# Patient Record
Sex: Female | Born: 1935 | Race: White | Hispanic: No | Smoking: Never smoker
Health system: Southern US, Community
[De-identification: ages and names within clinical notes are randomized; demographics above are authoritative.]

## PROBLEM LIST (undated history)

## (undated) DIAGNOSIS — J45909 Unspecified asthma, uncomplicated: Secondary | ICD-10-CM

## (undated) DIAGNOSIS — I639 Cerebral infarction, unspecified: Secondary | ICD-10-CM

## (undated) DIAGNOSIS — N182 Chronic kidney disease, stage 2 (mild): Secondary | ICD-10-CM

## (undated) DIAGNOSIS — R9439 Abnormal result of other cardiovascular function study: Secondary | ICD-10-CM

## (undated) DIAGNOSIS — Z8679 Personal history of other diseases of the circulatory system: Secondary | ICD-10-CM

## (undated) DIAGNOSIS — N3941 Urge incontinence: Secondary | ICD-10-CM

## (undated) DIAGNOSIS — R0602 Shortness of breath: Secondary | ICD-10-CM

## (undated) DIAGNOSIS — F419 Anxiety disorder, unspecified: Secondary | ICD-10-CM

## (undated) DIAGNOSIS — Z87898 Personal history of other specified conditions: Secondary | ICD-10-CM

## (undated) DIAGNOSIS — K219 Gastro-esophageal reflux disease without esophagitis: Secondary | ICD-10-CM

## (undated) DIAGNOSIS — I2510 Atherosclerotic heart disease of native coronary artery without angina pectoris: Principal | ICD-10-CM

## (undated) DIAGNOSIS — M48061 Spinal stenosis, lumbar region without neurogenic claudication: Secondary | ICD-10-CM

## (undated) DIAGNOSIS — H409 Unspecified glaucoma: Secondary | ICD-10-CM

## (undated) DIAGNOSIS — E119 Type 2 diabetes mellitus without complications: Secondary | ICD-10-CM

## (undated) DIAGNOSIS — G4733 Obstructive sleep apnea (adult) (pediatric): Secondary | ICD-10-CM

## (undated) DIAGNOSIS — K5792 Diverticulitis of intestine, part unspecified, without perforation or abscess without bleeding: Secondary | ICD-10-CM

## (undated) DIAGNOSIS — I480 Paroxysmal atrial fibrillation: Secondary | ICD-10-CM

## (undated) DIAGNOSIS — Z9861 Coronary angioplasty status: Principal | ICD-10-CM

## (undated) DIAGNOSIS — J302 Other seasonal allergic rhinitis: Secondary | ICD-10-CM

## (undated) DIAGNOSIS — M1990 Unspecified osteoarthritis, unspecified site: Secondary | ICD-10-CM

## (undated) DIAGNOSIS — I10 Essential (primary) hypertension: Secondary | ICD-10-CM

## (undated) DIAGNOSIS — G43909 Migraine, unspecified, not intractable, without status migrainosus: Secondary | ICD-10-CM

## (undated) DIAGNOSIS — H269 Unspecified cataract: Secondary | ICD-10-CM

## (undated) DIAGNOSIS — I495 Sick sinus syndrome: Secondary | ICD-10-CM

## (undated) DIAGNOSIS — E785 Hyperlipidemia, unspecified: Secondary | ICD-10-CM

## (undated) HISTORY — PX: COLONOSCOPY: SHX174

## (undated) HISTORY — DX: Gastro-esophageal reflux disease without esophagitis: K21.9

## (undated) HISTORY — DX: Obstructive sleep apnea (adult) (pediatric): G47.33

## (undated) HISTORY — PX: VAGINAL HYSTERECTOMY: SUR661

## (undated) HISTORY — DX: Cerebral infarction, unspecified: I63.9

## (undated) HISTORY — DX: Diverticulitis of intestine, part unspecified, without perforation or abscess without bleeding: K57.92

## (undated) HISTORY — DX: Urge incontinence: N39.41

## (undated) HISTORY — DX: Hyperlipidemia, unspecified: E78.5

## (undated) HISTORY — DX: Unspecified osteoarthritis, unspecified site: M19.90

## (undated) HISTORY — DX: Unspecified cataract: H26.9

## (undated) HISTORY — DX: Unspecified asthma, uncomplicated: J45.909

## (undated) HISTORY — DX: Anxiety disorder, unspecified: F41.9

## (undated) HISTORY — DX: Coronary angioplasty status: Z98.61

## (undated) HISTORY — DX: Other seasonal allergic rhinitis: J30.2

## (undated) HISTORY — DX: Migraine, unspecified, not intractable, without status migrainosus: G43.909

## (undated) HISTORY — PX: OTHER SURGICAL HISTORY: SHX169

## (undated) HISTORY — DX: Chronic kidney disease, stage 2 (mild): N18.2

## (undated) HISTORY — DX: Paroxysmal atrial fibrillation: I48.0

## (undated) HISTORY — DX: Essential (primary) hypertension: I10

## (undated) HISTORY — DX: Sick sinus syndrome: I49.5

## (undated) HISTORY — DX: Shortness of breath: R06.02

## (undated) HISTORY — PX: BREAST BIOPSY: SHX20

## (undated) HISTORY — DX: Personal history of other specified conditions: Z87.898

## (undated) HISTORY — DX: Personal history of other diseases of the circulatory system: Z86.79

## (undated) HISTORY — PX: APPENDECTOMY: SHX54

## (undated) HISTORY — DX: Atherosclerotic heart disease of native coronary artery without angina pectoris: I25.10

## (undated) HISTORY — DX: Unspecified glaucoma: H40.9

## (undated) HISTORY — DX: Spinal stenosis, lumbar region without neurogenic claudication: M48.061

## (undated) HISTORY — DX: Type 2 diabetes mellitus without complications: E11.9

---

## 1998-02-04 ENCOUNTER — Ambulatory Visit (HOSPITAL_BASED_OUTPATIENT_CLINIC_OR_DEPARTMENT_OTHER): Admission: RE | Admit: 1998-02-04 | Discharge: 1998-02-04 | Payer: Self-pay | Admitting: General Surgery

## 1999-04-21 ENCOUNTER — Encounter: Admission: RE | Admit: 1999-04-21 | Discharge: 1999-04-21 | Payer: Self-pay | Admitting: *Deleted

## 1999-08-05 ENCOUNTER — Other Ambulatory Visit: Admission: RE | Admit: 1999-08-05 | Discharge: 1999-08-05 | Payer: Self-pay | Admitting: *Deleted

## 2000-04-24 ENCOUNTER — Encounter: Admission: RE | Admit: 2000-04-24 | Discharge: 2000-04-24 | Payer: Self-pay | Admitting: *Deleted

## 2001-04-25 ENCOUNTER — Encounter: Admission: RE | Admit: 2001-04-25 | Discharge: 2001-04-25 | Payer: Self-pay | Admitting: *Deleted

## 2002-05-27 ENCOUNTER — Encounter: Admission: RE | Admit: 2002-05-27 | Discharge: 2002-05-27 | Payer: Self-pay | Admitting: *Deleted

## 2003-06-11 ENCOUNTER — Encounter: Admission: RE | Admit: 2003-06-11 | Discharge: 2003-06-11 | Payer: Self-pay | Admitting: *Deleted

## 2004-06-13 ENCOUNTER — Encounter: Admission: RE | Admit: 2004-06-13 | Discharge: 2004-06-13 | Payer: Self-pay | Admitting: *Deleted

## 2005-07-11 ENCOUNTER — Encounter: Admission: RE | Admit: 2005-07-11 | Discharge: 2005-07-11 | Payer: Self-pay | Admitting: *Deleted

## 2006-07-16 ENCOUNTER — Encounter: Admission: RE | Admit: 2006-07-16 | Discharge: 2006-07-16 | Payer: Self-pay | Admitting: Obstetrics and Gynecology

## 2006-07-18 ENCOUNTER — Encounter: Admission: RE | Admit: 2006-07-18 | Discharge: 2006-07-18 | Payer: Self-pay | Admitting: Obstetrics and Gynecology

## 2006-12-03 ENCOUNTER — Ambulatory Visit: Payer: Self-pay

## 2007-01-30 ENCOUNTER — Observation Stay (HOSPITAL_COMMUNITY): Admission: EM | Admit: 2007-01-30 | Discharge: 2007-01-30 | Payer: Self-pay | Admitting: Emergency Medicine

## 2007-01-30 ENCOUNTER — Encounter (INDEPENDENT_AMBULATORY_CARE_PROVIDER_SITE_OTHER): Payer: Self-pay | Admitting: Surgery

## 2007-07-23 ENCOUNTER — Encounter: Admission: RE | Admit: 2007-07-23 | Discharge: 2007-07-23 | Payer: Self-pay | Admitting: Obstetrics and Gynecology

## 2008-08-07 ENCOUNTER — Encounter: Admission: RE | Admit: 2008-08-07 | Discharge: 2008-08-07 | Payer: Self-pay | Admitting: Obstetrics and Gynecology

## 2009-08-09 ENCOUNTER — Encounter: Admission: RE | Admit: 2009-08-09 | Discharge: 2009-08-09 | Payer: Self-pay | Admitting: Obstetrics and Gynecology

## 2009-09-01 ENCOUNTER — Encounter (INDEPENDENT_AMBULATORY_CARE_PROVIDER_SITE_OTHER): Payer: Self-pay | Admitting: *Deleted

## 2009-11-17 ENCOUNTER — Ambulatory Visit: Payer: Self-pay | Admitting: Ophthalmology

## 2009-11-26 ENCOUNTER — Ambulatory Visit: Payer: Self-pay | Admitting: Cardiovascular Disease

## 2009-11-29 ENCOUNTER — Ambulatory Visit: Payer: Self-pay | Admitting: Ophthalmology

## 2009-12-13 ENCOUNTER — Ambulatory Visit: Payer: Self-pay | Admitting: Ophthalmology

## 2009-12-20 ENCOUNTER — Ambulatory Visit: Payer: Self-pay | Admitting: Ophthalmology

## 2010-03-20 ENCOUNTER — Encounter: Payer: Self-pay | Admitting: Obstetrics and Gynecology

## 2010-03-29 NOTE — Letter (Signed)
Summary: Colonoscopy Letter  [facility]  [address] [place], [place] [address]   Phone: [phone]  Fax: [phone]      September 01, 2009 MRN: [id number]   [facility] [address] [place], [place]  [address]   Dear [name],   According to your medical record, it is time for you to schedule a Colonoscopy. The American Cancer Society recommends this procedure as a method to detect early colon cancer. Patients with a family history of colon cancer, or a personal history of colon polyps or inflammatory bowel disease are at increased risk.  This letter has beeen generated based on the recommendations made at the time of your procedure. If you feel that in your particular situation this may no longer apply, please contact our office.  Please call our office at [phone] to schedule this appointment or to update your records at your earliest convenience.  Thank you for cooperating with [place] to provide you with the very best care possible.   Sincerely,  Hedwig Morton. Juanda Chance, M.D.  [facility]astro[facility]ology [facility] [phone]

## 2010-07-12 NOTE — Op Note (Signed)
NAMESWAYZE, KOZUCH                  ACCOUNT NO.:  [id number]   MEDICAL RECORD NO.:  [id number]          PATIENT TYPE:  INP   LOCATION:  2550                         FACILITY:  [facility]   PHYSICIAN:  Wilmon Arms. Corliss Skains, M.D. DATE OF BIRTH:  05-28-1929   DATE OF PROCEDURE:  01/30/2007  DATE OF DISCHARGE:                               OPERATIVE REPORT   PREOPERATIVE DIAGNOSIS:  Acute appendicitis.   POSTOPERATIVE DIAGNOSIS:  Acute appendicitis.   PROCEDURE PERFORMED:  Laparoscopic appendectomy.   SURGEON:  Wilmon Arms. Corliss Skains, M.D., FACS   ANESTHESIA:  General.   INDICATIONS:  The patient is a 77 year old female who presents with a 24-  hour history of right lower quadrant pain and nausea.  She presented for  workup and a CT scan showed signs of appendicitis.  We were then  consulted and recommended urgent surgery.   DESCRIPTION OF PROCEDURE:  The patient brought to the operating room,  placed in supine position on operating table.  After adequate level of  general anesthesia was obtained, a Foley catheter was placed under  sterile technique.  The patient's abdomen was prepped with Betadine and  draped in sterile fashion.  Time-out was taken assure the proper  patient, proper procedure.  A small incision was made just below her  umbilicus after infiltrating 0.25% Marcaine.  Dissection was carried  down to the fascia which was opened vertically.  The peritoneal cavity  was bluntly entered.  Stay sutures of 0 Vicryl was placed around the  fascial opening.  The Hasson cannula was inserted secured with the stay  suture.  Pneumoperitoneum was obtained by insufflating CO2 maintaining  maximal pressure of 15 mmHg.  A 5-mm port was placed in right upper  quadrant.  Another 5 mm port was placed in the left lower quadrant.  The  scope was moved to the right upper quadrant port site.  The cecum was  mobilized medially.  A small appendix was identified.  The appendix was  not perforated.  It seemed  inflamed at its base.  The mesoappendix was  then divided with the harmonic scalpel.  We divided across the base of  the appendix and the corner of the cecum with Endo-GIA stapler.  We then  placed the appendix in EndoCatch sac and removed it through the  umbilical port site.  We inspected the staple line.  There was a little  bit of oozing in this area.  These were controlled with clips.  We  irrigated the right lower quadrant thoroughly.  Pneumoperitoneum was  then released as ports were removed under direct vision.  The stay  suture was used to close the umbilical fascia.  4-0 Monocryl was used to  close the skin incisions.  Steri-Strips and clean dressings were  applied.  The patient was extubated and brought to recovery in stable  condition.  The Foley catheter was removed.      Wilmon Arms. Tsuei, M.D.  Electronically Signed    MKT/MEDQ  D:  01/30/2007  T:  01/30/2007  Job:  [id number]

## 2010-07-15 ENCOUNTER — Other Ambulatory Visit: Payer: Self-pay | Admitting: *Deleted

## 2010-07-15 DIAGNOSIS — Z1231 Encounter for screening mammogram for malignant neoplasm of breast: Secondary | ICD-10-CM

## 2010-07-15 DIAGNOSIS — M8580 Other specified disorders of bone density and structure, unspecified site: Secondary | ICD-10-CM

## 2010-07-15 NOTE — Discharge Summary (Signed)
[name], [name]                  ACCOUNT NO.:  [id number]   MEDICAL RECORD NO.:  [id number]          PATIENT TYPE:  INP   LOCATION:  5707                         FACILITY:  [facility]   PHYSICIAN:  Abigail Boyd, M.D.   DATE OF BIRTH:  Mar 30, 1929   DATE OF ADMISSION:  01/30/2007  DATE OF DISCHARGE:  01/30/2007                               DISCHARGE SUMMARY   DISCHARGING PHYSICIAN:  Dr. Lurene Shadow.   CHIEF COMPLAINT/REASON FOR ADMISSION:  Abigail Boyd is a 77 year old female  patient with a 24-hour history of right lower quadrant pain, nausea and  anorexia.  She has a past medical history of diabetes, depression,  hypertension and osteoporosis.  She has had a prior vaginal  hysterectomy.  On clinical exam in the ER, she had right lower quadrant  tenderness that was consistent with probable appendicitis.  A CT scan  did reveal acute appendicitis.  Her white count was 15,600.  She was  subsequently admitted by Dr. Corliss Skains with a diagnosis of acute  appendicitis.   HOSPITAL COURSE:  The patient was taken from the ER to the OR where she  underwent a laparoscopic appendectomy for acute appendicitis.  She  tolerated the procedure well and was sent back to the general floor to  recover.   On postoperative day one-half, January 30, 2007 at 9:30 a.m., she was  evaluated per myself. She was still requiring nasal cannula O2 but this  was being weaned off.  She was afebrile, vital signs were stable.  Her  creatinine was 1.17, potassium 4.2.  She was tolerating a solid diet and  had no complaints.  Her abdomen was soft, slightly distended but she had  active bowel sounds and she was tender over her surgical incisions.  Plans were to advance her diet and switch her to oral pain medications  and give her Toradol while here in the hospital. The patient reports  successful use of ibuprofen at home and plans are to discharge her after  2 o'clock if she tolerated these changes, she did and she was  subsequently discharged home on January 30, 2007.   DISCHARGE DIAGNOSES:  1. Acute appendicitis.  2. Status post laparoscopic appendectomy on January 30, 2007 by Dr.      Corliss Skains.   DISCHARGE MEDICATIONS:  The patient will resume the following home  medications:  1. Glipizide 10 mg daily 8 hour sleep.  2. Actonel 150 mg monthly.  3. Citrucel fiber laxative twice daily.  4. Claritin daily.  5. Hydrochlorothiazide 50 mg daily.  6. Paroxetine 10 mg daily.  7. Crestor 10 mg daily.  8. Omeprazole 20 mg daily.  9. Aspirin 81 mg daily.  10.Flonase nasal spray p.r.n.   NEW MEDICATIONS:  1. Vicodin 5/500 one tablet every 4 hours as needed for pain.  2. Over the counter ibuprofen 2-3 tablets every 8 hours as needed for      pain in addition to Vicodin. Take with food or snack.  3. If you take Tylenol instead of Vicodin do not take additional time      with the  Vicodin since Vicodin contains Tylenol. If you take a dose      of Vicodin, you can take plain Tylenol 4 hours after last dose of      Vicodin.   OTHER INSTRUCTIONS:  Please refer to [facility]'s  laparoscopic procedures discharge instructions. Followup appointment,  she needs to call Dr. Corliss Skains to be seen in 2 weeks.      Abigail Boyd, N.P.      Abigail Boyd, M.D.  Electronically Signed    ALE/MEDQ  D:  02/25/2007  T:  02/25/2007  Job:  [id number]   cc:   Wilmon Arms. Tsuei, M.D.

## 2010-08-29 ENCOUNTER — Ambulatory Visit
Admission: RE | Admit: 2010-08-29 | Discharge: 2010-08-29 | Disposition: A | Discharge status: Home / Self Care | Payer: Medicare Other | Source: Ambulatory Visit | Attending: *Deleted | Admitting: *Deleted

## 2010-08-29 ENCOUNTER — Other Ambulatory Visit: Payer: Self-pay | Admitting: Obstetrics and Gynecology

## 2010-08-29 DIAGNOSIS — Z1231 Encounter for screening mammogram for malignant neoplasm of breast: Secondary | ICD-10-CM

## 2010-08-29 DIAGNOSIS — M8580 Other specified disorders of bone density and structure, unspecified site: Secondary | ICD-10-CM

## 2010-08-30 ENCOUNTER — Ambulatory Visit
Admission: RE | Admit: 2010-08-30 | Discharge: 2010-08-30 | Disposition: A | Discharge status: Home / Self Care | Payer: Medicare Other | Source: Ambulatory Visit | Attending: *Deleted | Admitting: *Deleted

## 2010-11-22 ENCOUNTER — Encounter: Payer: Self-pay | Admitting: Internal Medicine

## 2010-12-05 LAB — COMPREHENSIVE METABOLIC PANEL
Calcium: 9.5
Chloride: 97
Creatinine, Ser: 1.17
GFR calc non Af Amer: 45 — ABNORMAL LOW
Potassium: 6.8
Sodium: 135
Total Bilirubin: 1.6 — ABNORMAL HIGH

## 2010-12-05 LAB — DIFFERENTIAL
Basophils Relative: 0
Eosinophils Absolute: 0 — ABNORMAL LOW
Lymphocytes Relative: 8 — ABNORMAL LOW
Lymphs Abs: 1.3
Neutro Abs: 13.7 — ABNORMAL HIGH

## 2010-12-05 LAB — LIPASE, BLOOD: Lipase: 24

## 2010-12-05 LAB — POCT CARDIAC MARKERS
CKMB, poc: 3.4
Operator id: 282201

## 2010-12-05 LAB — CBC
RDW: 13.5
WBC: 15.6 — ABNORMAL HIGH

## 2010-12-05 LAB — URINALYSIS, ROUTINE W REFLEX MICROSCOPIC: Hgb urine dipstick: NEGATIVE

## 2010-12-13 ENCOUNTER — Encounter: Payer: Self-pay | Admitting: Internal Medicine

## 2010-12-13 ENCOUNTER — Ambulatory Visit (AMBULATORY_SURGERY_CENTER): Payer: Medicare Other | Admitting: *Deleted

## 2010-12-13 VITALS — Ht 64.0 in | Wt 161.6 lb

## 2010-12-13 DIAGNOSIS — Z1211 Encounter for screening for malignant neoplasm of colon: Secondary | ICD-10-CM

## 2010-12-13 MED ORDER — PEG-KCL-NACL-NASULF-NA ASC-C 100 G PO SOLR: 1.0000 | Freq: Once | ORAL | Status: DC

## 2010-12-25 ENCOUNTER — Emergency Department: Payer: Self-pay | Admitting: Emergency Medicine

## 2010-12-26 ENCOUNTER — Telehealth: Payer: Self-pay | Admitting: Internal Medicine

## 2010-12-26 ENCOUNTER — Emergency Department: Payer: Self-pay | Admitting: Orthopedic Surgery

## 2010-12-26 NOTE — Telephone Encounter (Signed)
No charge. 

## 2010-12-27 ENCOUNTER — Other Ambulatory Visit: Payer: Medicare Other | Admitting: Internal Medicine

## 2010-12-29 ENCOUNTER — Ambulatory Visit: Payer: Self-pay | Admitting: Orthopedic Surgery

## 2010-12-29 DIAGNOSIS — I10 Essential (primary) hypertension: Secondary | ICD-10-CM

## 2011-01-02 ENCOUNTER — Ambulatory Visit: Payer: Self-pay | Admitting: Orthopedic Surgery

## 2011-02-24 ENCOUNTER — Encounter: Payer: Self-pay | Admitting: Orthopedic Surgery

## 2011-02-28 ENCOUNTER — Encounter: Payer: Self-pay | Admitting: Orthopedic Surgery

## 2011-03-21 DIAGNOSIS — K5792 Diverticulitis of intestine, part unspecified, without perforation or abscess without bleeding: Secondary | ICD-10-CM | POA: Insufficient documentation

## 2011-03-21 DIAGNOSIS — K649 Unspecified hemorrhoids: Secondary | ICD-10-CM | POA: Insufficient documentation

## 2011-03-21 DIAGNOSIS — E559 Vitamin D deficiency, unspecified: Secondary | ICD-10-CM | POA: Insufficient documentation

## 2011-03-21 DIAGNOSIS — H409 Unspecified glaucoma: Secondary | ICD-10-CM | POA: Insufficient documentation

## 2011-03-21 DIAGNOSIS — E049 Nontoxic goiter, unspecified: Secondary | ICD-10-CM | POA: Insufficient documentation

## 2011-03-21 DIAGNOSIS — K219 Gastro-esophageal reflux disease without esophagitis: Secondary | ICD-10-CM | POA: Insufficient documentation

## 2011-03-21 DIAGNOSIS — M8580 Other specified disorders of bone density and structure, unspecified site: Secondary | ICD-10-CM | POA: Insufficient documentation

## 2011-03-21 DIAGNOSIS — Z8669 Personal history of other diseases of the nervous system and sense organs: Secondary | ICD-10-CM | POA: Insufficient documentation

## 2011-03-21 DIAGNOSIS — Z862 Personal history of diseases of the blood and blood-forming organs and certain disorders involving the immune mechanism: Secondary | ICD-10-CM | POA: Insufficient documentation

## 2011-03-21 DIAGNOSIS — N8501 Benign endometrial hyperplasia: Secondary | ICD-10-CM | POA: Insufficient documentation

## 2011-03-31 ENCOUNTER — Encounter: Payer: Self-pay | Admitting: Orthopedic Surgery

## 2011-04-28 ENCOUNTER — Encounter: Payer: Self-pay | Admitting: Orthopedic Surgery

## 2011-06-13 DIAGNOSIS — I200 Unstable angina: Secondary | ICD-10-CM | POA: Diagnosis not present

## 2011-06-13 DIAGNOSIS — Z8679 Personal history of other diseases of the circulatory system: Secondary | ICD-10-CM

## 2011-06-13 HISTORY — DX: Personal history of other diseases of the circulatory system: Z86.79

## 2011-06-21 DIAGNOSIS — R9439 Abnormal result of other cardiovascular function study: Secondary | ICD-10-CM

## 2011-06-21 HISTORY — DX: Abnormal result of other cardiovascular function study: R94.39

## 2011-06-26 ENCOUNTER — Encounter (HOSPITAL_COMMUNITY): Payer: Self-pay | Admitting: Pharmacy Technician

## 2011-06-28 ENCOUNTER — Other Ambulatory Visit: Payer: Self-pay | Admitting: Cardiology

## 2011-06-28 DIAGNOSIS — I2510 Atherosclerotic heart disease of native coronary artery without angina pectoris: Secondary | ICD-10-CM

## 2011-06-28 HISTORY — DX: Atherosclerotic heart disease of native coronary artery without angina pectoris: I25.10

## 2011-06-29 ENCOUNTER — Other Ambulatory Visit: Payer: Self-pay | Admitting: Cardiology

## 2011-06-29 ENCOUNTER — Ambulatory Visit
Admission: RE | Admit: 2011-06-29 | Discharge: 2011-06-29 | Disposition: A | Discharge status: Home / Self Care | Payer: Medicare Other | Source: Ambulatory Visit | Attending: Cardiology | Admitting: Cardiology

## 2011-06-29 DIAGNOSIS — R52 Pain, unspecified: Secondary | ICD-10-CM

## 2011-07-05 HISTORY — PX: CORONARY ANGIOPLASTY WITH STENT PLACEMENT: SHX49

## 2011-07-06 ENCOUNTER — Encounter (HOSPITAL_COMMUNITY)
Admission: RE | Disposition: A | Discharge status: Home / Self Care | Payer: Self-pay | Source: Ambulatory Visit | Attending: Cardiology

## 2011-07-06 ENCOUNTER — Encounter (HOSPITAL_COMMUNITY): Payer: Self-pay | Admitting: Cardiology

## 2011-07-06 ENCOUNTER — Inpatient Hospital Stay (HOSPITAL_COMMUNITY)
Admission: RE | Admit: 2011-07-06 | Discharge: 2011-07-07 | DRG: 247 | Disposition: A | Discharge status: Home / Self Care | Payer: Medicare Other | Source: Ambulatory Visit | Attending: Cardiology | Admitting: Cardiology

## 2011-07-06 DIAGNOSIS — E7800 Pure hypercholesterolemia, unspecified: Secondary | ICD-10-CM | POA: Diagnosis present

## 2011-07-06 DIAGNOSIS — Z7982 Long term (current) use of aspirin: Secondary | ICD-10-CM

## 2011-07-06 DIAGNOSIS — I2510 Atherosclerotic heart disease of native coronary artery without angina pectoris: Principal | ICD-10-CM | POA: Diagnosis present

## 2011-07-06 DIAGNOSIS — I10 Essential (primary) hypertension: Secondary | ICD-10-CM | POA: Diagnosis present

## 2011-07-06 DIAGNOSIS — K219 Gastro-esophageal reflux disease without esophagitis: Secondary | ICD-10-CM | POA: Diagnosis present

## 2011-07-06 DIAGNOSIS — E1169 Type 2 diabetes mellitus with other specified complication: Secondary | ICD-10-CM | POA: Diagnosis present

## 2011-07-06 DIAGNOSIS — Z79899 Other long term (current) drug therapy: Secondary | ICD-10-CM

## 2011-07-06 DIAGNOSIS — Z9849 Cataract extraction status, unspecified eye: Secondary | ICD-10-CM

## 2011-07-06 DIAGNOSIS — Z955 Presence of coronary angioplasty implant and graft: Secondary | ICD-10-CM

## 2011-07-06 DIAGNOSIS — M129 Arthropathy, unspecified: Secondary | ICD-10-CM | POA: Diagnosis present

## 2011-07-06 DIAGNOSIS — N189 Chronic kidney disease, unspecified: Secondary | ICD-10-CM | POA: Diagnosis present

## 2011-07-06 DIAGNOSIS — Z882 Allergy status to sulfonamides status: Secondary | ICD-10-CM

## 2011-07-06 DIAGNOSIS — E785 Hyperlipidemia, unspecified: Secondary | ICD-10-CM | POA: Diagnosis present

## 2011-07-06 DIAGNOSIS — I200 Unstable angina: Secondary | ICD-10-CM | POA: Diagnosis present

## 2011-07-06 DIAGNOSIS — H409 Unspecified glaucoma: Secondary | ICD-10-CM | POA: Diagnosis present

## 2011-07-06 DIAGNOSIS — E119 Type 2 diabetes mellitus without complications: Secondary | ICD-10-CM | POA: Diagnosis present

## 2011-07-06 DIAGNOSIS — I129 Hypertensive chronic kidney disease with stage 1 through stage 4 chronic kidney disease, or unspecified chronic kidney disease: Secondary | ICD-10-CM | POA: Diagnosis present

## 2011-07-06 DIAGNOSIS — M4800 Spinal stenosis, site unspecified: Secondary | ICD-10-CM | POA: Diagnosis present

## 2011-07-06 DIAGNOSIS — I2582 Chronic total occlusion of coronary artery: Secondary | ICD-10-CM | POA: Diagnosis present

## 2011-07-06 HISTORY — DX: Abnormal result of other cardiovascular function study: R94.39

## 2011-07-06 HISTORY — PX: LEFT HEART CATHETERIZATION WITH CORONARY ANGIOGRAM: SHX5451

## 2011-07-06 LAB — GLUCOSE, CAPILLARY
Glucose-Capillary: 173 mg/dL — ABNORMAL HIGH (ref 70–99)
Glucose-Capillary: 216 mg/dL — ABNORMAL HIGH (ref 70–99)

## 2011-07-06 LAB — POCT ACTIVATED CLOTTING TIME
Activated Clotting Time: 188 seconds
Activated Clotting Time: 226 seconds

## 2011-07-06 SURGERY — LEFT HEART CATHETERIZATION WITH CORONARY ANGIOGRAM
Anesthesia: LOCAL

## 2011-07-06 MED ORDER — MAGNESIUM OXIDE 400 (241.3 MG) MG PO TABS
400.0000 mg | ORAL_TABLET | Freq: Every day | ORAL | Status: DC
  Administered 2011-07-06: 400 mg via ORAL
  Filled 2011-07-06 (×2): qty 1

## 2011-07-06 MED ORDER — GLIPIZIDE 10 MG PO TABS
10.0000 mg | ORAL_TABLET | Freq: Every day | ORAL | Status: DC
  Administered 2011-07-07: 10 mg via ORAL
  Filled 2011-07-06 (×2): qty 1

## 2011-07-06 MED ORDER — MIDAZOLAM HCL 2 MG/2ML IJ SOLN
INTRAMUSCULAR | Status: AC
  Filled 2011-07-06: qty 2

## 2011-07-06 MED ORDER — IPRATROPIUM BROMIDE 0.06 % NA SOLN
1.0000 | Freq: Every day | NASAL | Status: DC
  Filled 2011-07-06: qty 15

## 2011-07-06 MED ORDER — LIDOCAINE HCL (PF) 1 % IJ SOLN
INTRAMUSCULAR | Status: AC
  Filled 2011-07-06: qty 30

## 2011-07-06 MED ORDER — SODIUM CHLORIDE 0.9 % IV SOLN: 1.0000 mL/kg/h | INTRAVENOUS | Status: AC

## 2011-07-06 MED ORDER — VITAMIN D3 50 MCG (2000 UT) PO TABS: 1.0000 | ORAL_TABLET | Freq: Every day | ORAL | Status: DC

## 2011-07-06 MED ORDER — MAGNESIUM OXIDE 400 MG PO TABS: 400.0000 mg | ORAL_TABLET | Freq: Every day | ORAL | Status: DC

## 2011-07-06 MED ORDER — ASPIRIN 81 MG PO CHEW
81.0000 mg | CHEWABLE_TABLET | Freq: Every day | ORAL | Status: DC
  Administered 2011-07-06: 81 mg via ORAL
  Filled 2011-07-06 (×2): qty 1

## 2011-07-06 MED ORDER — HYDROCHLOROTHIAZIDE 50 MG PO TABS
50.0000 mg | ORAL_TABLET | Freq: Every day | ORAL | Status: DC
  Administered 2011-07-06: 50 mg via ORAL
  Filled 2011-07-06 (×2): qty 1

## 2011-07-06 MED ORDER — ONDANSETRON HCL 4 MG/2ML IJ SOLN: 4.0000 mg | Freq: Four times a day (QID) | INTRAMUSCULAR | Status: DC | PRN

## 2011-07-06 MED ORDER — SODIUM CHLORIDE 0.9 % IJ SOLN: 3.0000 mL | INTRAMUSCULAR | Status: DC | PRN

## 2011-07-06 MED ORDER — SODIUM CHLORIDE 0.9 % IV SOLN
INTRAVENOUS | Status: DC
  Administered 2011-07-06: 09:00:00 via INTRAVENOUS

## 2011-07-06 MED ORDER — ATORVASTATIN CALCIUM 40 MG PO TABS
40.0000 mg | ORAL_TABLET | Freq: Every day | ORAL | Status: DC
  Administered 2011-07-06: 40 mg via ORAL
  Filled 2011-07-06 (×2): qty 1

## 2011-07-06 MED ORDER — SODIUM CHLORIDE 0.9 % IJ SOLN: 3.0000 mL | Freq: Two times a day (BID) | INTRAMUSCULAR | Status: DC

## 2011-07-06 MED ORDER — ACETAMINOPHEN 325 MG PO TABS: 650.0000 mg | ORAL_TABLET | ORAL | Status: DC | PRN

## 2011-07-06 MED ORDER — SODIUM CHLORIDE 0.9 % IV SOLN: 250.0000 mL | INTRAVENOUS | Status: DC

## 2011-07-06 MED ORDER — TICAGRELOR 90 MG PO TABS
ORAL_TABLET | ORAL | Status: AC
  Filled 2011-07-06: qty 2

## 2011-07-06 MED ORDER — FENTANYL CITRATE 0.05 MG/ML IJ SOLN
INTRAMUSCULAR | Status: AC
  Filled 2011-07-06: qty 2

## 2011-07-06 MED ORDER — PANTOPRAZOLE SODIUM 40 MG PO TBEC
40.0000 mg | DELAYED_RELEASE_TABLET | Freq: Every day | ORAL | Status: DC
  Administered 2011-07-06: 40 mg via ORAL
  Filled 2011-07-06 (×2): qty 1

## 2011-07-06 MED ORDER — ADENOSINE 12 MG/4ML IV SOLN
12.0000 mL | Freq: Once | INTRAVENOUS | Status: DC
  Filled 2011-07-06: qty 12

## 2011-07-06 MED ORDER — ALPRAZOLAM 0.25 MG PO TABS
0.2500 mg | ORAL_TABLET | Freq: Every day | ORAL | Status: DC | PRN
  Administered 2011-07-06: 0.25 mg via ORAL
  Filled 2011-07-06: qty 1

## 2011-07-06 MED ORDER — BIVALIRUDIN 250 MG IV SOLR
INTRAVENOUS | Status: AC
  Filled 2011-07-06: qty 250

## 2011-07-06 MED ORDER — TICAGRELOR 90 MG PO TABS
90.0000 mg | ORAL_TABLET | Freq: Two times a day (BID) | ORAL | Status: DC
  Administered 2011-07-07: 90 mg via ORAL
  Filled 2011-07-06 (×2): qty 1

## 2011-07-06 MED ORDER — PAROXETINE HCL 10 MG PO TABS
10.0000 mg | ORAL_TABLET | Freq: Every day | ORAL | Status: DC
  Administered 2011-07-06: 10 mg via ORAL
  Filled 2011-07-06 (×2): qty 1

## 2011-07-06 MED ORDER — DIAZEPAM 5 MG PO TABS
5.0000 mg | ORAL_TABLET | ORAL | Status: AC
  Administered 2011-07-06: 5 mg via ORAL
  Filled 2011-07-06: qty 1

## 2011-07-06 MED ORDER — GLIPIZIDE 5 MG PO TABS
5.0000 mg | ORAL_TABLET | Freq: Every evening | ORAL | Status: DC
  Administered 2011-07-06: 5 mg via ORAL
  Filled 2011-07-06 (×2): qty 1

## 2011-07-06 MED ORDER — NITROGLYCERIN 0.2 MG/ML ON CALL CATH LAB
INTRAVENOUS | Status: AC
  Filled 2011-07-06: qty 1

## 2011-07-06 MED ORDER — HEPARIN (PORCINE) IN NACL 2-0.9 UNIT/ML-% IJ SOLN
INTRAMUSCULAR | Status: AC
  Filled 2011-07-06: qty 2000

## 2011-07-06 MED ORDER — LORATADINE 10 MG PO TABS
10.0000 mg | ORAL_TABLET | Freq: Every day | ORAL | Status: DC
  Administered 2011-07-06: 10 mg via ORAL
  Filled 2011-07-06 (×2): qty 1

## 2011-07-06 MED ORDER — AZELASTINE HCL 0.1 % NA SOLN
1.0000 | Freq: Every day | NASAL | Status: DC
  Administered 2011-07-06: 1 via NASAL
  Filled 2011-07-06: qty 30

## 2011-07-06 MED ORDER — HEPARIN SODIUM (PORCINE) 1000 UNIT/ML IJ SOLN
INTRAMUSCULAR | Status: AC
  Filled 2011-07-06: qty 1

## 2011-07-06 MED ORDER — VITAMIN D3 25 MCG (1000 UNIT) PO TABS
1000.0000 [IU] | ORAL_TABLET | Freq: Every day | ORAL | Status: DC
  Administered 2011-07-06: 1000 [IU] via ORAL
  Filled 2011-07-06 (×2): qty 1

## 2011-07-06 NOTE — CV Procedure (Signed)
[facility]     CARDIAC CATHETERIZATION REPORT  NAME: Abigail Boyd MRN: [id number] DOB: Feb 19, 1930  ADMIT DATE: 07/06/2011  Performing Cardiologist: Marykay Lex  Primary Physician: Raliegh Ip, MD, MD Primary Cardiologist:  Julieanne Manson, MD  Procedures Performed:  Left Heart Catheterization via 5 Fr Right Radial Artery access  Native Coronary Angiography  IC NTG Injection -- 200 mcg  Fractional Flow Reserve Measurement of mid Left Circumflex-OM1 60-80% lesion; Final Ration 0.79 %  Percutaneous Coronary Artery Intervention on mid Left Circumflex-OM1 (at AV-Groove Circumflex bifurcation) 60-80% with a Promus Element 2.5 mm x 16 mm DES; final diameter 2.65 mm  Indication(s): Unstable Angina (awakening with Jaw pain)  Abnormal Lexiscan Myoview - inferolateral ischemia.  History: Abigail Boyd is a 78 y.o. female seen by Dr. Clarene Duke on 06/13/11 for an episode of Jaw pain that awoke her from sleep. This was evaluated with a Lexiscan Myoview that demonstrated an inferolateral perfusion defect suggesting ischemia.  She has not had any further episodes of Jaw pain, but this was felt to be a potential angina equivalent as it awoke her from sleep. She is now referred for definitive invasive evaluation with cardiac catheterization with possible PCI.  Consent: The procedure with Risks/Benefits/Alternatives and Indications was reviewed with the patient (and family).  All questions were answered.    Risks / Complications include, but not limited to: Death, MI, CVA/TIA, VF/VT (with defibrillation), Bradycardia (need for temporary pacer placement), contrast induced nephropathy, bleeding / bruising / hematoma / pseudoaneurysm, vascular or coronary injury (with possible emergent CT or Vascular Surgery), adverse medication reactions, infection.    The patient (and family) voice understanding and agree to proceed.   Consent for signed by MD and patient with RN witness  -- placed on chart.  Procedure: The patient was brought to the 2nd Floor [facility] in the fasting state and prepped and draped in the usual sterile fashion for Right groin or radial) access.  A modified Allen's test with plethysmography was performed on the right wrist demonstrating adequate Ulnar Artery collateral flow.    Sterile technique was used including antiseptics, cap, gloves, gown, hand hygiene, mask and sheet. Skin prep: Chlorhexidine;  Time Out: Verified patient identification, verified procedure, site/side was marked, verified correct patient position, special equipment/implants available, medications/allergies/relevent history reviewed, required imaging and test results available.  Performed  The right wrist was anesthetized with 1% subcutaneous Lidocaine.  The right radial artery was accessed using the Seldinger Technique with placement of a 5 Fr Glide Sheath. The sheath was aspirated and flushed.  Then a total of 10 ml (1/2 dose) of standard Radial Artery Cocktail (see medications) was infused.  Radial Cocktail: 2.5 mg Nicardipine, 400 mcg NTG, 2 ml 2% Lidocaine  A 5 Fr TIG 4.0 Catheter was advanced of over a Long Exchange Safety J wire into the ascending Aorta.  The catheter was used to engage the Left then Right coronary artery.  Multiple cineangiographic views of both the Left and Right coronary artery system(s) were performed.   This catheter was then exchanged for a 5Fr Angled Pigtail catheter that was advanced across the Aortic Valve over a wire.  LV hemodynamics were measured and Left Ventriculography was not performed to conserve contrast.  LV hemodynamics were sampled, and the catheter was pulled back across the Aortic Valve for measurement of "pull-back" gradient.  The catheter was removed completely out of the body over the [employer] J wire.  Hemodynamics:  Central Aortic Pressure: 120/78 mmHg; 84 mmHg  LV Pressure / LV End diastolic  Pressure: 120/9 mmHg; 15 mmHg  Coronary Angiographic Data:   Dominance: Right  Left Main:  Large caliber, bifurcates into LAD & Left Circumflex (LCx)  Left Anterior Descending (LAD):  Large caliber vessel reaches around the apex; Septal Perforators from proximal to distal providing L-RPDA collaterals; minimal luminal irregularities.  1st diagonal (D1):  Large major diagonal branch (shares branch point with major SP branch), bifurcates distally covering large anterolateral distribution; angiographically normal.  Circumflex (LCx):  Large-to moderate caliber vessel; small insignificant OMB (not numbered) then irregular 60-80% lesion extending into Major Lateral OM1 beyond bifurcation into AV-Groove branch with LPLs.  1st obtuse marginal:   Proximal portion as continuation of mid LCx, ~60% lesion; the vessel branches and reaches to the distal lateral / inferolateral wall.   Atrio-ventricular Groove (AVG) Cx: small caliber, ostial ~20% stenosis; branches into 2-3 small LPLs  Right Coronary Artery: Small to moderate caliber Dominant artery; 100% mid occlusion with R-R bridging collaterals.  The RPA fills via LAD/SP and distal LAD collaterals, RPL fills via LCx collaterals  PERCUTANEOUS CORONARY INTERVENTION PROCEDURE  After reviewing the initial cineangiography images, the culprit lesion(s) -- mid LCx-OM1 -- was identified, and the decision was made to proceed with percutaneous coronary intervention.  A weight based bolus of IV Angiomax was administered and the drip was continued until completion of the procedure.   Oral Ticagrelor 180mg  was administered.  The Guide catheter(s) were advanced over a J-wire and used to engage the left Coronary Artery. An ACT of > 200 Sec was confirmed prior to advancing the Guidewire.  Lesion #1:  Mid Left Circumflex into OM1  Pre-PCI Stenosis: 60-80 % Post-PCI Stenosis: 0 %     TIMI 3 flow       TIMI 3 flow  Guide Catheter: 5 French EZRad Left  Guidewire:  Volcano FFR wire   Adenosine was infused for a total of 90 seconds. ** Final FFR ratio 0.79 = physiologically significant  At this point decision is made to proceed with intervention on the lesion in question.  Pre-Dilitation Balloon: Emerge Monorail 2.0 mm x 12 mm   1st Inflation:  6 Atm for 30 Sec  Scout angiography did not reveal evidence of dissection or perforation STENT: Promus Element DES 2.5 mm x 16 mm   1st Inflation: 12 Atm for  31 Sec;    2nd Inflation - post-dilation: 14 Atm for  46 Sec; final diameter 2.6 mm   Post-deployment cineangiography with and without guidewire in place was performed in multiple orthogonal views demonstrating  excellent stent deployment and did not reveal evidence of dissection or perforation.  The sheath was removed in the Cath Lab with a TR band placed at  14 ml Air at  1020 hour (time).  Reverse Allen's test with plethysmography revealed non-occlusive hemostasis.  The patient was transported to the  PACU holding area in a chest pain free, hemodynamically stable condition.   The patient  was stable before, during and following the procedure.   Patient did tolerate procedure well. There were not complications.  EBL: < 10 mL  Medications:  Premedication: 5mg   Valium  Sedation:  2 mg IV Versed, 50 IV mcg Fentanyl  Contrast:  95 ml Omnipaque  Radial Cocktail: 1/2 dose of == 2.5 mg Nicardipine, 400 mcg NTG, 2 ml 2% Lidocaine  IV Heparin: 3500 Units + 2000 Units  Angiomax Bolus & drip (weight  base)  Brilinta 180mg   Adenosine infusion x 90 sec for FFR  Impression:   Severe 2 vessel CAD with 100% mid RCA occlusion (R-L bridging collaterals, LCx-RPL & LAD/SP-RPDA collaterals) as well as a ~60-80% mid Left Circumflex-OM1 lesion that was noted to be hemodynamically significant by FFR of 0.79.  Successful PCI of mid LCx-OM1 with Promus Element DES 2.5 mm x 16 mm - post dilated with stent balloon to 2.65 mm.  Preserved LVEF by Myoview ST  Normal  LVEDP   Plan:  Overnight monitoring post Radial PCI. Anticipate d/c tomorrow.  ASA and Brilinta for minimum of 1 year.  Continue Statin & consider BB as OP  F/u with me or Dr. Julieanne Manson. @ [facility]    The case and results was discussed with the patient (and family). The case and results was not discussed with the patient's PCP. The case and results was discussed with the patient's Cardiologist.  Time Spend Directly with Patient:  60 minutes  Earnie Rockhold W, M.D., M.S. [facility] [address] [place], [place]  [address]  [phone]  07/06/2011 11:05 AM

## 2011-07-06 NOTE — H&P (Addendum)
History and Physical Interval Note:  NAME:  Abigail Boyd   MRN: [id number] DOB:  February 25, 1930   ADMIT DATE: 07/06/2011  Abigail Boyd is a 78 y.o. female seen by Dr. Clarene Duke on 06/13/11 for an episode of Jaw pain that awoke her from sleep.  This was evaluated with a Lexiscan Myoview that demonstrated an inferolateral perfusion defect suggesting ischemia. She has not had any further episodes of Jaw pain, but this was felt to be a potential angina equivalent as it awoke her from sleep.  She is now referred for definitive invasive evaluation with cardiac catheterization with possible PCI.   Past Medical History  Diagnosis Date  . Allergy   . Anxiety     no meds  . Cataract   . Glaucoma   . Diabetes mellitus     On oral medications  . GERD (gastroesophageal reflux disease)   . Hyperlipidemia   . Hypertension   . Chronic kidney disease     hx bladder infections  . Spinal stenosis   . Abnormal nuclear stress test 06/21/2011    Inferolateral reversible defect  . Unstable angina 06/13/2011    Jaw pain awakening from sleep   Past Surgical History  Procedure Date  . Cataracts     both eyes  . Colonoscopy   . Vaginal hysterectomy   . Breast biopsy     both breast    FAMHx: Family History  Problem Relation Age of Onset  . Colon cancer Neg Hx   . Esophageal cancer Neg Hx   . Stomach cancer Neg Hx    SOCHx:  reports that she has never smoked. She does not have any smokeless tobacco history on file. She reports that she does not drink alcohol. Her drug history not on file. She is a widow with one child.  ALLERGIES: Allergies  Allergen Reactions  . Sulfa Antibiotics     welts    HOME MEDICATIONS: Crestor 20 mg daily, paroxetine 10 mg daily, glipizide 5 mg every morning/10 mg every afternoon, HCTZ 50 mg daily, metformin 1000 mg twice a day, omeprazole 20 mg daily, aspirin 81 mg daily, magnesium fish oil and calcium with deep vitamin D daily.  PHYSICAL EXAM:There were no vitals  taken for this visit. General appearance: alert, cooperative, appears stated age, no distress and Normal Mood & Affect, A&O x3.  Answers ?s appropriately Neck: no adenopathy, no carotid bruit, no JVD, supple, symmetrical, trachea midline and thyroid not enlarged, symmetric, no tenderness/mass/nodules Lungs: clear to auscultation bilaterally, normal percussion bilaterally and non-labored Heart: regular rate and rhythm, S1, S2 normal, no murmur, click, rub or gallop Abdomen: soft, non-tender; bowel sounds normal; no masses,  no organomegaly Extremities: extremities normal, atraumatic, no cyanosis or edema Pulses: 2+ and symmetric Skin: Skin color, texture, turgor normal. No rashes or lesions Neurologic: Grossly normal; CN II-XII grossly intact.  IMPRESSION & PLAN The patients' history has been reviewed, patient examined, no change in status from most recent note, stable for surgery. I have reviewed the patients' chart and labs. Questions were answered to the patient's satisfaction.    Scout F Cullimore has presented today for surgery, with the diagnosis of chest pain with an abnormal Myoview ST. The various methods of treatment have been discussed with the patient.  Risks / Complications include, but not limited to: Death, MI, CVA/TIA, VF/VT (with defibrillation), Bradycardia (need for temporary pacer placement), contrast induced nephropathy, bleeding / bruising / hematoma / pseudoaneurysm, vascular or coronary injury (with possible  emergent CT or Vascular Surgery), adverse medication reactions, infection.    After consideration of risks, benefits and other options for treatment, the patient has consented to Procedure(s):   LEFT HEART CATHETERIZATION AND CORONARY ANGIOGRAPHY +/- AD HOC PERCUTANEOUS CORONARY INTERVENTION  as a procedural intervention.   We will proceed with the planned procedure.   Maurina Fawaz W [facility] [address] [place], [place]  [address]  [phone]  07/06/2011 [phone] AM

## 2011-07-07 ENCOUNTER — Other Ambulatory Visit: Payer: Self-pay

## 2011-07-07 DIAGNOSIS — I2510 Atherosclerotic heart disease of native coronary artery without angina pectoris: Secondary | ICD-10-CM | POA: Diagnosis present

## 2011-07-07 DIAGNOSIS — Z9861 Coronary angioplasty status: Secondary | ICD-10-CM | POA: Diagnosis present

## 2011-07-07 LAB — CBC
MCV: 92.6 fL (ref 78.0–100.0)
Platelets: 231 10*3/uL (ref 150–400)
RBC: 3.77 MIL/uL — ABNORMAL LOW (ref 3.87–5.11)
RDW: 12.7 % (ref 11.5–15.5)
WBC: 8.7 10*3/uL (ref 4.0–10.5)

## 2011-07-07 LAB — BASIC METABOLIC PANEL
CO2: 26 mEq/L (ref 19–32)
Chloride: 100 mEq/L (ref 96–112)
Creatinine, Ser: 1.04 mg/dL (ref 0.50–1.10)
GFR calc Af Amer: 58 mL/min — ABNORMAL LOW (ref >90)
Sodium: 139 mEq/L (ref 135–145)

## 2011-07-07 LAB — GLUCOSE, CAPILLARY: Glucose-Capillary: 185 mg/dL — ABNORMAL HIGH (ref 70–99)

## 2011-07-07 MED ORDER — TICAGRELOR 90 MG PO TABS: 90.0000 mg | ORAL_TABLET | Freq: Two times a day (BID) | ORAL | Status: DC

## 2011-07-07 MED ORDER — ACETAMINOPHEN 325 MG PO TABS: 650.0000 mg | ORAL_TABLET | ORAL | Status: DC | PRN

## 2011-07-07 MED ORDER — METFORMIN HCL 500 MG PO TABS: 1000.0000 mg | ORAL_TABLET | Freq: Two times a day (BID) | ORAL | Status: DC

## 2011-07-07 MED ORDER — SODIUM CHLORIDE 0.9 % IV BOLUS (SEPSIS)
500.0000 mL | Freq: Once | INTRAVENOUS | Status: AC
  Administered 2011-07-07: 500 mL via INTRAVENOUS

## 2011-07-07 MED ORDER — NITROGLYCERIN 0.4 MG SL SUBL: 0.4000 mg | SUBLINGUAL_TABLET | SUBLINGUAL | Status: DC | PRN

## 2011-07-07 MED FILL — Nicardipine HCl IV Soln 2.5 MG/ML: INTRAVENOUS | Qty: 1 | Status: AC

## 2011-07-07 MED FILL — Dextrose Inj 5%: INTRAVENOUS | Qty: 50 | Status: AC

## 2011-07-07 NOTE — Progress Notes (Signed)
Assisted pt to bathroom.  Pt c/o "feeling funny" and "dizzy".  Toilet closer than bed, assisted pt onto commode, pt became unresponsive and eyes rolled back.  2nd RN Arthur Holms called for assist. Pt did not urinate or strain.  Pt did not fall.  RN did not have to support pt's full weight.  Tele mx visible from bathroom, SR 60's. Lifted pt into chair then bed by RN x 2.  Pt then became pale & diaphoretic.  CBG 162.  BP 104/59.  Pt slowly became verbally responsive after about 2 minutes, remained drowsy, answered questions appropriately w/ slow but clear speech.  Pupils PEARL 4mm, moves all extremities, follows commands, denies pain, just states "don't feel right".  IVF begun.  Rt radial site level 0.  Amy Thompson PA notified, CBC resulted but not BMET, orders received.  EKG SR.  Called Dr Royann Shivers as PA advised, no further orders received.  BP up to 115/56 after receiving approx 100 cc IVF.

## 2011-07-07 NOTE — Discharge Instructions (Signed)
Angioplasty Angioplasty is a procedure to widen a narrow blood vessel. The procedure is usually done on the blood vessels of the heart (coronary arteries) but may help vessels to other parts of the body such as the legs. When a vessel in the heart becomes partially blocked there is decreased blood flow to that area. This may lead to chest pain or a heart attack (myocardial infarction).  Angioplasty may be done after a procedure that found a problem or as an emergency to treat a heart attack by opening the blocked arteries. The arteries are usually blocked by cholesterol buildup (plaque) in the lining or walls. LET YOUR CAREGIVER KNOW ABOUT:  Allergies.   Medicines taken, including herbs, eyedrops, over-the-counter medicines, and creams.   Use of steroids (by mouth or creams).   Previous problems with anesthetics or numbing medicines.   Possibility of pregnancy, if this applies.   History of blood clots (thrombophlebitis).   History of bleeding or blood problems.   Previous surgery.   Other health problems.  RISKS AND COMPLICATIONS  Damage to the artery.   A blockage may return.   Bleeding at the insertion site.   Blood clot to another part of the body.  BEFORE THE PROCEDURE  Let your caregiver know if you have had an allergy to dyes used in X-ray, or if you have ever had kidney problems or failure.   Do not eat or drink starting from midnight up to the time of the procedure, or as directed.   You may drink enough water to take your medicines the morning of the procedure if you were instructed to do so.   You should be at the hospital or outpatient facility where the procedure is to be done 60 minutes prior to the procedure or as directed.  PROCEDURE  You may be given a medicine to help you relax before and during the procedure through an intravenous (IV) access in your hand or arm.   Medicine that numbs the area (local anesthetic) may be used before inserting the long,  thin tube (catheter).   You will be prepared for the procedure by washing and shaving the area where the catheter will be inserted. This is usually done in the groin.   A catheter will be inserted into an artery using a guide wire. This is guided under a type of X-ray (fluoroscopy) to the opening of the blocked artery.   Dye is then injected and X-rays are taken.   Once positioned at the narrowed portion of the blood vessel, the balloon is inflated to make the artery wider. Expanding the balloon crushes the plaque into the wall of the vessel and improves the blood flow.   Sometimes the artery may be made wider using a laser or other tools to remove plaque.   When the blood flow is better, the balloon is deflated and the catheter is removed.  AFTER THE PROCEDURE  You will stay in bed for several hours.   The access site will be watched and you will be checked frequently.   Blood tests, X-rays, and an electrocardiogram (EKG) may be done.   You may stay in the hospital overnight for observation.  Document Released: 02/11/2000 Document Revised: 02/02/2011 Document Reviewed: 06/06/2007 [employer] Patient Information 2012 [employer]. 

## 2011-07-07 NOTE — Progress Notes (Signed)
CARDIAC REHAB PHASE I   PRE:  Rate/Rhythm: 72 SR    BP: lying108/50, sitting 119/50, standing 129/62    SaO2: 95 RA  MODE:  Ambulation: 340 ft   POST:  Rate/Rhythm: 91 SR    BP: sitting 133/57     SaO2:   Took orthostatics, which were negative. Assist x2 to ambulate, handheld assist. No sx. Pt c/o weakness but no dizziness, etc. Sts she didn't have dizziness when she had syncope earlier. VSS. Ed completed. Requests her name be sent to [facility] although pt sts her hips limit her exercise.  [id number]  Harriet Masson CES, ACSM

## 2011-07-07 NOTE — Care Management Note (Signed)
    Page 1 of 1   07/07/2011     11:34:35 AM   CARE MANAGEMENT NOTE 07/07/2011  Patient:  Abigail Boyd, Abigail Boyd   Account Number:  [id number]  Date Initiated:  07/06/2011  Documentation initiated by:  Alvira Philips Assessment:   78 yr-old female adm with unstable angina     Anticipated DC Plan:  HOME/SELF CARE      DC Planning Services  CM consult  Medication Assistance      Discharge Disposition:  HOME/SELF CARE  Comments:  07/07/11 1120 [name] RN MSN CCM Insurance copay for [name] is $125 for 90-day supply and pt must use [employer] as it is in network.  Pt states she can afford copay.  07/06/11 1400 [name] RN MSN CCM Pt to d/c on Brilinta.  Provided card for 30-day supply, insurance benefits check underway.

## 2011-07-07 NOTE — Progress Notes (Signed)
Pt remains drowsy but awakens easily.  No neuro deficits. VSS.  IVF converted to [facility].

## 2011-07-07 NOTE — Progress Notes (Signed)
[facility] DAILY PROGRESS NOTE  NAME:  Abigail Boyd   MRN: [id number] DOB:  1929-11-03   ADMIT DATE: 07/06/2011   Patient Description   78 y.o. female with PMH below: presented for cardiac catheterization after a Myoview ST demonstrated inferolateral ischemia after an episode of awakening with Jaw pain that lasted several hours.    Past Medical History  Diagnosis Date  . Allergy   . Anxiety     no meds  . Cataract   . Glaucoma   . Diabetes mellitus     On oral medications  . GERD (gastroesophageal reflux disease)   . Hyperlipidemia   . Hypertension   . Chronic kidney disease     hx bladder infections  . Spinal stenosis   . Abnormal nuclear stress test 06/21/2011    Inferolateral reversible defect  . Unstable angina 06/13/2011    Jaw pain awakening from sleep  . Coronary artery disease   . Shortness of breath   . Arthritis    Clinical Course:  Obs from Short-stay after cath-PCI.   Length of Stay:  LOS: 1 day   Subjective:   Today Abigail Boyd feels great.  Ambulating without difficulty. Did have a near-syncopal event at ~0400 this AM when getting up to go to bathroom - no arrhythmias; BP ~100s.  Given fluid & BP better - no further episodes.  Objective:  Temp:  [97.4 F (36.3 C)-97.9 F (36.6 C)] 97.4 F (36.3 C) (05/10 0736) Pulse Rate:  [65-79] 76  (05/10 0736) Resp:  [17-21] 18  (05/10 0736) BP: (104-146)/(49-70) 122/51 mmHg (05/10 0736) SpO2:  [96 %-98 %] 96 % (05/10 0736) Weight change:  Physical Exam: General appearance: alert, cooperative, no distress and appears youner than stated age; Very pleasant mood & affect Neck: no adenopathy, no carotid bruit, no JVD, supple, symmetrical, trachea midline and thyroid not enlarged, symmetric, no tenderness/mass/nodules Lungs: clear to auscultation bilaterally, normal percussion bilaterally and non-labored Heart: regular rate and rhythm, S1, S2 normal, no murmur, click, rub or  gallop Abdomen: soft, non-tender; bowel sounds normal; no masses,  no organomegaly Extremities: extremities normal, atraumatic, no cyanosis or edema Pulses: 2+ and symmetric Right radial reverse Allen's normal Neurologic: Alert and oriented X 3, normal strength and tone. Normal symmetric reflexes. Normal coordination and gait  Intake/Output from previous day: 05/09 0701 - 05/10 0700 In: 1390 [P.O.:360; I.V.:530; IV Piggyback:500] Out: 1500 [Urine:1500]  Intake/Output Summary (Last 24 hours) at 07/07/11 1101 Last data filed at 07/07/11 0900  Gross per 24 hour  Intake   1390 ml  Output   1900 ml  Net   -510 ml   Pertinent Lab Results: stable BUN/Cr as well as CBC.  Cath: 100% mid RCA occlusion with bridging R-R collaterals as well as L-R collaterals from both LCx & LAD septals.  60-80% mid Cx-OM1 lesion. -- FFR 0.79.  Percutaneous Coronary Artery Intervention on mid Left Circumflex-OM1 (at AV-Groove Circumflex bifurcation) 60-80% with a Promus Element 2.5 mm x 16 mm DES; final diameter 2.65 mm   MAR Reviewed  Assessment/Plan:  Principal Problem:  *Unstable angina Active Problems:  Abnormal nuclear stress test  Chronic kidney disease  Hyperlipidemia  Hypertension  Spinal stenosis  Diabetes mellitus   I suspect her episode last PM was orthostatic hypotension - will d/c Hydrodiuril ; no arrythmias.  Otherwise seems to have tolerated her cath-PCI without trouble.  No further episodes of Jaw pain -- would suspect that the  episode coincided with the RCA occlusion.  If BP elevated on discharge, would consider a non-diuretic medication -- BB would be best option, but will defer to Dr. Clarene Duke.  Continue statin  Can restart Metformin 48 hrs post cath  F/u with Dr. Clarene Duke (or me if no space available).  Time Spent Directly with Patient:  15 minutes   Laurice Kimmons W, M.D., M.S. [facility] [address] [place], [place]   [address]  [phone]  07/07/2011 11:01 AM

## 2011-07-07 NOTE — Discharge Summary (Signed)
Patient ID: Abigail Boyd,  MRN: [id number], DOB/AGE: 1929/10/05 78 y.o.  Admit date: 07/06/2011 Discharge date: 07/07/2011  Primary Care Provider: Dr Loma Sender Primary Cardiologist: Dr Caprice Kluver  Discharge Diagnoses:  Principal Problem:  *Unstable angina  Active Problems:  CAD, total RCA with collat, OM1 DES this admission  Abnormal nuclear stress test prior to admission  Diabetes mellitus  Hyperlipidemia  Hypertension  Chronic kidney disease, SCr Nl  Spinal stenosis    Procedures: Cath/ OM1 DES 07/06/11   Hospital Course: Abigail Boyd is a 78 y.o. female seen by Dr. Clarene Duke on 06/13/11 for an episode of Jaw pain that awoke her from sleep. This was evaluated with a Lexiscan Myoview that demonstrated an inferolateral perfusion defect suggesting ischemia.  She has not had any further episodes of Jaw pain, but this was felt to be a potential angina equivalent as it awoke her from sleep. She was admitted for definitive invasive evaluation with cardiac catheterization with possible PCI. On 07/06/11 she had cath which showed total RCA occlusion with L-R collat. She had a significant OM1 stenosis that was intervened on with a Promus stent. She tolerated the procedure well. Dr Herbie Baltimore felt she could be discharged 07/07/11. Her diuretic was held. She has a history of "chronic kidney disease" but her SCr is normal. She will follow up with Dr Clarene Duke in two weeks.   Discharge Vitals:  Blood pressure 122/51, pulse 76, temperature 97.4 F (36.3 C), temperature source Oral, resp. rate 18, height 5\' 4"  (1.626 m), weight 69.854 kg (154 lb), SpO2 96.00%.    Labs: Results for orders placed during the hospital encounter of 07/06/11 (from the past 48 hour(s))  GLUCOSE, CAPILLARY     Status: Abnormal   Collection Time   07/06/11  7:26 AM      Component Value Range Comment   Glucose-Capillary 173 (*) 70 - 99 (mg/dL)    Comment 1 Documented in Chart      Comment 2 Notify RN     POCT ACTIVATED CLOTTING  TIME     Status: Normal   Collection Time   07/06/11 10:07 AM      Component Value Range Comment   Activated Clotting Time 188     POCT ACTIVATED CLOTTING TIME     Status: Normal   Collection Time   07/06/11 10:16 AM      Component Value Range Comment   Activated Clotting Time 226     GLUCOSE, CAPILLARY     Status: Abnormal   Collection Time   07/06/11  6:16 PM      Component Value Range Comment   Glucose-Capillary 164 (*) 70 - 99 (mg/dL)   GLUCOSE, CAPILLARY     Status: Abnormal   Collection Time   07/06/11  8:10 PM      Component Value Range Comment   Glucose-Capillary 216 (*) 70 - 99 (mg/dL)    Comment 1 Documented in Chart      Comment 2 Notify RN     CBC     Status: Abnormal   Collection Time   07/07/11  3:30 AM      Component Value Range Comment   WBC 8.7  4.0 - 10.5 (K/uL)    RBC 3.77 (*) 3.87 - 5.11 (MIL/uL)    Hemoglobin 11.8 (*) 12.0 - 15.0 (g/dL)    HCT 40.9 (*) 81.1 - 46.0 (%)    MCV 92.6  78.0 - 100.0 (fL)    MCH 31.3  26.0 -  34.0 (pg)    MCHC 33.8  30.0 - 36.0 (g/dL)    RDW 18.8  41.6 - 60.6 (%)    Platelets 231  150 - 400 (K/uL)   BASIC METABOLIC PANEL     Status: Abnormal   Collection Time   07/07/11  3:30 AM      Component Value Range Comment   Sodium 139  135 - 145 (mEq/L)    Potassium 3.8  3.5 - 5.1 (mEq/L)    Chloride 100  96 - 112 (mEq/L)    CO2 26  19 - 32 (mEq/L)    Glucose, Bld 146 (*) 70 - 99 (mg/dL)    BUN 22  6 - 23 (mg/dL)    Creatinine, Ser 3.01  0.50 - 1.10 (mg/dL)    Calcium 9.6  8.4 - 10.5 (mg/dL)    GFR calc non Af Amer 50 (*) >90 (mL/min)    GFR calc Af Amer 58 (*) >90 (mL/min)   GLUCOSE, CAPILLARY     Status: Abnormal   Collection Time   07/07/11  4:50 AM      Component Value Range Comment   Glucose-Capillary 168 (*) 70 - 99 (mg/dL)   GLUCOSE, CAPILLARY     Status: Abnormal   Collection Time   07/07/11  7:32 AM      Component Value Range Comment   Glucose-Capillary 185 (*) 70 - 99 (mg/dL)     Disposition:  Follow-up Information     Follow up with LITTLE, ALFRED B, MD on 07/13/2011. (11:00am)    Contact information:   [address] [place] [address] [phone]          Discharge Medications:  Medication List  As of 07/07/2011 12:14 PM   STOP taking these medications         hydrochlorothiazide 50 MG tablet         TAKE these medications         acetaminophen 325 MG tablet   Commonly known as: TYLENOL   Take 2 tablets (650 mg total) by mouth every 4 (four) hours as needed.      ALPRAZolam 0.25 MG tablet   Commonly known as: XANAX   Take 0.25 mg by mouth daily as needed. For anxiety      aspirin 81 MG chewable tablet   Chew 81 mg by mouth daily.      azelastine 137 MCG/SPRAY nasal spray   Commonly known as: ASTELIN   Place 1 spray into both nostrils at bedtime.      B-12 2500 MCG Tabs   Take 2,500 mcg by mouth daily.      glipiZIDE 5 MG tablet   Commonly known as: GLUCOTROL   Take 5 mg by mouth every evening.      glipiZIDE 10 MG tablet   Commonly known as: GLUCOTROL   Take 10 mg by mouth daily with breakfast.      ipratropium 0.06 % nasal spray   Commonly known as: ATROVENT   Place 1 spray into the nose Daily.      loratadine 10 MG tablet   Commonly known as: CLARITIN   Take 10 mg by mouth daily.      magnesium oxide 400 MG tablet   Commonly known as: MAG-OX   Take 400 mg by mouth daily.      Melatonin 5 MG Tabs   Take 5 mg by mouth daily.      metFORMIN 500 MG tablet  Commonly known as: GLUCOPHAGE   Take 2 tablets (1,000 mg total) by mouth 2 (two) times daily with a meal. Do not take till 5/12      nitroGLYCERIN 0.4 MG SL tablet   Commonly known as: NITROSTAT   Place 1 tablet (0.4 mg total) under the tongue every 5 (five) minutes as needed for chest pain.      omeprazole 20 MG capsule   Commonly known as: PRILOSEC   Take 20 mg by mouth daily.      PARoxetine 10 MG tablet   Commonly known as: PAXIL   Take 10 mg by mouth every morning.       rosuvastatin 20 MG tablet   Commonly known as: CRESTOR   Take 10 mg by mouth daily.      Vitamin D3 2000 UNITS Tabs   Take 1 tablet by mouth daily.            Outstanding Labs/Studies  Duration of Discharge Encounter: Greater than 30 minutes including physician time.  Jolene Provost PA-C 07/07/2011 12:14 PM  I saw Mrs. Sevin this AM, she was doing fine post PCI.   D/c'd HCTZ to avoid potential orthostatic hypotension.  OK for d/c today.  Marykay Lex, M.D., M.S. [facility] [address] [place], [place]  [address]  [phone] Pager # [phone]  07/07/2011 2:20 PM

## 2011-07-07 NOTE — Progress Notes (Signed)
Called by primary RN to see pt with poss syncopal episode.  Per RN she was assisting the pt to the bathroom when she stated she was feeling faint.  She continued on to the bathroom where she had a syncopal episode while sitting in the bathroom.  On my arrival pt was in bed & arousable to voice & diaphoretic. Pt oriented, able to move all ext, denies pain, and appears to have no focal deficits.  BP 104/59 (low for pt) HR 65, sats 97 %  On 3l. EKG obtained that was wnl.  Primary MD paged.

## 2011-07-10 LAB — GLUCOSE, CAPILLARY: Glucose-Capillary: 162 mg/dL — ABNORMAL HIGH (ref 70–99)

## 2011-07-14 NOTE — Discharge Summary (Signed)
Patient ID: Abigail Boyd,  MRN: [id number], DOB/AGE: 06-02-1929 78 y.o.  Admit date: 07/06/2011 Discharge date: 07/14/2011  Primary Care Provider: Dr Loma Sender Primary Cardiologist: Dr Langston Reusing  Discharge Diagnoses Principal Problem:  *Unstable angina Active Problems:  CAD, total RCA with collat, OM1 DES this admission  Abnormal nuclear stress test  Diabetes mellitus  Hyperlipidemia  Hypertension  Chronic kidney disease  Spinal stenosis    Procedures: Cath/ OM1 DES 07/06/11    Hospital Course Abigail Boyd is a 78 y.o. female seen by Dr. Clarene Duke on 06/13/11 for an episode of Jaw pain that awoke her from sleep. This was evaluated with a Lexiscan Myoview that demonstrated an inferolateral perfusion defect suggesting ischemia.  She has not had any further episodes of Jaw pain, but this was felt to be a potential angina equivalent as it awoke her from sleep. She was admitted for definitive invasive evaluation with cardiac catheterization with possible PCI. On 07/06/11 she had cath which showed total RCA occlusion with L-R collat. She had a significant OM1 stenosis that was intervened on with a Promus stent. She tolerated the procedure well. Dr Herbie Baltimore felt she could be discharged 07/07/11. Her diuretic was held. She has a history of "chronic kidney disease" but her SCr is normal. She will follow up with Dr Clarene Duke in two weeks.   Discharge Vitals:  Blood pressure 122/51, pulse 76, temperature 97.4 F (36.3 C), temperature source Oral, resp. rate 18, height 5\' 4"  (1.626 m), weight 69.854 kg (154 lb), SpO2 96.00%.    Labs: No results found for this or any previous visit (from the past 48 hour(s)).  Disposition:  Follow-up Information    Follow up with LITTLE, ALFRED B, MD on 07/13/2011. (11:00am)    Contact information:   [address] [place] [address] [phone]          Discharge Medications:  Medication List  As of 07/14/2011  3:11 PM   STOP  taking these medications         hydrochlorothiazide 50 MG tablet         TAKE these medications         acetaminophen 325 MG tablet   Commonly known as: TYLENOL   Take 2 tablets (650 mg total) by mouth every 4 (four) hours as needed.      ALPRAZolam 0.25 MG tablet   Commonly known as: XANAX   Take 0.25 mg by mouth daily as needed. For anxiety      aspirin 81 MG chewable tablet   Chew 81 mg by mouth daily.      azelastine 137 MCG/SPRAY nasal spray   Commonly known as: ASTELIN   Place 1 spray into both nostrils at bedtime.      B-12 2500 MCG Tabs   Take 2,500 mcg by mouth daily.      glipiZIDE 5 MG tablet   Commonly known as: GLUCOTROL   Take 5 mg by mouth every evening.      glipiZIDE 10 MG tablet   Commonly known as: GLUCOTROL   Take 10 mg by mouth daily with breakfast.      ipratropium 0.06 % nasal spray   Commonly known as: ATROVENT   Place 1 spray into the nose Daily.      loratadine 10 MG tablet   Commonly known as: CLARITIN   Take 10 mg by mouth daily.      magnesium oxide 400 MG tablet   Commonly known  as: MAG-OX   Take 400 mg by mouth daily.      Melatonin 5 MG Tabs   Take 5 mg by mouth daily.      metFORMIN 500 MG tablet   Commonly known as: GLUCOPHAGE   Take 2 tablets (1,000 mg total) by mouth 2 (two) times daily with a meal. Do not take till 5/12      nitroGLYCERIN 0.4 MG SL tablet   Commonly known as: NITROSTAT   Place 1 tablet (0.4 mg total) under the tongue every 5 (five) minutes as needed for chest pain.      omeprazole 20 MG capsule   Commonly known as: PRILOSEC   Take 20 mg by mouth daily.      PARoxetine 10 MG tablet   Commonly known as: PAXIL   Take 10 mg by mouth every morning.      rosuvastatin 20 MG tablet   Commonly known as: CRESTOR   Take 10 mg by mouth daily.      Ticagrelor 90 MG Tabs tablet   Commonly known as: BRILINTA   Take 1 tablet (90 mg total) by mouth 2 (two) times daily.      Vitamin D3 2000 UNITS Tabs    Take 1 tablet by mouth daily.            Outstanding Labs/Studies  Duration of Discharge Encounter: Greater than 30 minutes including physician time.  Jolene Provost PA-C 07/14/2011 3:11 PM  I saw Mrs. Drapeau the AM of discharge, she was doing fine post PCI.  D/c'd HCTZ to avoid potential orthostatic hypotension.  OK for d/c.  Marykay Lex, M.D., M.S. [facility] [address] [place], [place]  [address]  [phone] Pager # [phone]  07/14/2011 4:37 PM

## 2011-08-17 ENCOUNTER — Encounter: Payer: Self-pay | Admitting: Cardiology

## 2011-08-28 ENCOUNTER — Encounter: Payer: Self-pay | Admitting: Cardiology

## 2011-09-02 ENCOUNTER — Emergency Department (HOSPITAL_COMMUNITY)
Admission: EM | Admit: 2011-09-02 | Discharge: 2011-09-03 | Disposition: A | Discharge status: Home / Self Care | Payer: Medicare Other | Attending: Emergency Medicine | Admitting: Emergency Medicine

## 2011-09-02 ENCOUNTER — Emergency Department (HOSPITAL_COMMUNITY): Payer: Medicare Other

## 2011-09-02 ENCOUNTER — Encounter (HOSPITAL_COMMUNITY): Payer: Self-pay | Admitting: *Deleted

## 2011-09-02 DIAGNOSIS — I10 Essential (primary) hypertension: Secondary | ICD-10-CM | POA: Insufficient documentation

## 2011-09-02 DIAGNOSIS — Z7982 Long term (current) use of aspirin: Secondary | ICD-10-CM | POA: Insufficient documentation

## 2011-09-02 DIAGNOSIS — Z79899 Other long term (current) drug therapy: Secondary | ICD-10-CM | POA: Insufficient documentation

## 2011-09-02 DIAGNOSIS — R509 Fever, unspecified: Secondary | ICD-10-CM | POA: Insufficient documentation

## 2011-09-02 DIAGNOSIS — H409 Unspecified glaucoma: Secondary | ICD-10-CM | POA: Insufficient documentation

## 2011-09-02 DIAGNOSIS — M129 Arthropathy, unspecified: Secondary | ICD-10-CM | POA: Insufficient documentation

## 2011-09-02 DIAGNOSIS — R5381 Other malaise: Secondary | ICD-10-CM | POA: Insufficient documentation

## 2011-09-02 DIAGNOSIS — I2510 Atherosclerotic heart disease of native coronary artery without angina pectoris: Secondary | ICD-10-CM | POA: Insufficient documentation

## 2011-09-02 DIAGNOSIS — E785 Hyperlipidemia, unspecified: Secondary | ICD-10-CM | POA: Insufficient documentation

## 2011-09-02 DIAGNOSIS — E119 Type 2 diabetes mellitus without complications: Secondary | ICD-10-CM | POA: Insufficient documentation

## 2011-09-02 DIAGNOSIS — R51 Headache: Secondary | ICD-10-CM | POA: Insufficient documentation

## 2011-09-02 DIAGNOSIS — R109 Unspecified abdominal pain: Secondary | ICD-10-CM | POA: Insufficient documentation

## 2011-09-02 LAB — CBC
HCT: 34.5 % — ABNORMAL LOW (ref 36.0–46.0)
Hemoglobin: 11.6 g/dL — ABNORMAL LOW (ref 12.0–15.0)
MCH: 32.1 pg (ref 26.0–34.0)
MCV: 95.6 fL (ref 78.0–100.0)
RBC: 3.61 MIL/uL — ABNORMAL LOW (ref 3.87–5.11)

## 2011-09-02 LAB — BASIC METABOLIC PANEL
BUN: 24 mg/dL — ABNORMAL HIGH (ref 6–23)
CO2: 25 mEq/L (ref 19–32)
Calcium: 10.1 mg/dL (ref 8.4–10.5)
Glucose, Bld: 128 mg/dL — ABNORMAL HIGH (ref 70–99)
Sodium: 137 mEq/L (ref 135–145)

## 2011-09-02 LAB — POCT I-STAT TROPONIN I: Troponin i, poc: 0 ng/mL (ref 0.00–0.08)

## 2011-09-02 MED ORDER — SODIUM CHLORIDE 0.9 % IV BOLUS (SEPSIS)
1000.0000 mL | Freq: Once | INTRAVENOUS | Status: AC
  Administered 2011-09-02: 1000 mL via INTRAVENOUS

## 2011-09-02 NOTE — ED Provider Notes (Signed)
History     CSN: [id number]  Arrival date & time 09/02/11  1932   First MD Initiated Contact with Patient 09/02/11 2202      Chief Complaint  Patient presents with  . Fever  . Fatigue    (Consider location/radiation/quality/duration/timing/severity/associated sxs/prior treatment) Patient is a 78 y.o. female presenting with general illness. The history is provided by the patient and a relative.  Illness  The current episode started yesterday. The onset was gradual. The problem occurs continuously. The problem has been unchanged. The problem is moderate. The symptoms are relieved by acetaminophen. Nothing aggravates the symptoms. Associated symptoms include a fever and headaches. Pertinent negatives include no abdominal pain, no diarrhea, no nausea, no vomiting, no congestion, no rhinorrhea, no sore throat, no muscle aches, no neck pain, no neck stiffness, no cough, no URI and no rash. She has received no recent medical care.    Past Medical History  Diagnosis Date  . Allergy   . Anxiety     no meds  . Cataract   . Glaucoma   . Diabetes mellitus     On oral medications  . GERD (gastroesophageal reflux disease)   . Hyperlipidemia   . Hypertension   . Chronic kidney disease     hx bladder infections  . Spinal stenosis   . Abnormal nuclear stress test 06/21/2011    Inferolateral reversible defect  . Unstable angina 06/13/2011    Jaw pain awakening from sleep  . Coronary artery disease   . Shortness of breath   . Arthritis     Past Surgical History  Procedure Date  . Cataracts     both eyes  . Colonoscopy   . Vaginal hysterectomy   . Breast biopsy     both breast  . Cardiac catheterization 07/05/2011  . Coronary angioplasty   . Appendectomy   . Coronary angioplasty with stent placement 07/05/2011    stent to CX    Family History  Problem Relation Age of Onset  . Colon cancer Neg Hx   . Esophageal cancer Neg Hx   . Stomach cancer Neg Hx     History  Substance  Use Topics  . Smoking status: Never Smoker   . Smokeless tobacco: Never Used  . Alcohol Use: No    OB History    Grav Para Term Preterm Abortions TAB SAB Ect Mult Living                  Review of Systems  Constitutional: Positive for fever, chills and fatigue (generalized).  HENT: Negative for congestion, sore throat, rhinorrhea, neck pain and neck stiffness.   Respiratory: Negative for cough and shortness of breath.   Cardiovascular: Negative for chest pain and palpitations.  Gastrointestinal: Negative for nausea, vomiting, abdominal pain and diarrhea.  Genitourinary: Negative for dysuria, urgency and frequency.  Musculoskeletal: Negative for myalgias, back pain and arthralgias.  Skin: Negative for color change and rash.  Neurological: Positive for headaches. Negative for weakness and light-headedness.  All other systems reviewed and are negative.    Allergies  Sulfa antibiotics  Home Medications   Current Outpatient Rx  Name Route Sig Dispense Refill  . ACETAMINOPHEN 325 MG PO TABS Oral Take 650 mg by mouth every 4 (four) hours as needed. For pain or fever    . ALPRAZOLAM 0.25 MG PO TABS Oral Take 0.25 mg by mouth daily as needed. For anxiety    . ASPIRIN 81 MG PO CHEW Oral Chew 81  mg by mouth daily.    . AZELASTINE HCL 137 MCG/SPRAY NA SOLN Each Nare Place 1 spray into both nostrils at bedtime.     Marland Kitchen VITAMIN D3 2000 UNITS PO TABS Oral Take 1 tablet by mouth daily.     . B-12 2500 MCG PO TABS Oral Take 2,500 mcg by mouth daily.     Marland Kitchen GLIPIZIDE 10 MG PO TABS Oral Take 10 mg by mouth daily with breakfast.    . GLIPIZIDE 5 MG PO TABS Oral Take 5 mg by mouth every evening.     . IPRATROPIUM BROMIDE 0.06 % NA SOLN Nasal Place 1 spray into the nose Daily.     Marland Kitchen LORATADINE 10 MG PO TABS Oral Take 10 mg by mouth daily.    Marland Kitchen MAGNESIUM OXIDE 400 MG PO TABS Oral Take 400 mg by mouth daily.    Marland Kitchen METFORMIN HCL 500 MG PO TABS Oral Take 1,000 mg by mouth 2 (two) times daily with a  meal. Do not take till 5/12    . OMEPRAZOLE 20 MG PO CPDR Oral Take 20 mg by mouth daily.     Marland Kitchen PAROXETINE HCL 10 MG PO TABS Oral Take 10 mg by mouth every morning.     Marland Kitchen ROSUVASTATIN CALCIUM 20 MG PO TABS Oral Take 10 mg by mouth daily.    Marland Kitchen TICAGRELOR 90 MG PO TABS Oral Take 90 mg by mouth 2 (two) times daily.    Marland Kitchen NITROGLYCERIN 0.4 MG SL SUBL Sublingual Place 1 tablet (0.4 mg total) under the tongue every 5 (five) minutes as needed for chest pain. 25 tablet 2    BP 143/54  Pulse 77  Temp 98 F (36.7 C) (Oral)  Resp 18  SpO2 98%  Physical Exam  Nursing note and vitals reviewed. Constitutional: She is oriented to person, place, and time. She appears well-developed and well-nourished. No distress.  HENT:  Head: Normocephalic and atraumatic.  Nose: Nose normal.  Mouth/Throat: Oropharynx is clear and moist.  Eyes: Pupils are equal, round, and reactive to light.  Neck: Normal range of motion. Neck supple.  Cardiovascular: Normal rate, regular rhythm, normal heart sounds and intact distal pulses.   Pulmonary/Chest: Effort normal and breath sounds normal. No respiratory distress. She exhibits no tenderness.  Abdominal: Soft. Bowel sounds are normal. She exhibits no distension. There is tenderness. Rebound: mild, suprapubic.  Musculoskeletal: Normal range of motion. She exhibits no edema.  Lymphadenopathy:    She has no cervical adenopathy.  Neurological: She is alert and oriented to person, place, and time.  Skin: Skin is warm and dry. No rash noted.  Psychiatric: She has a normal mood and affect.    ED Course  Procedures (including critical care time)  Labs Reviewed  GLUCOSE, CAPILLARY - Abnormal; Notable for the following:    Glucose-Capillary 64 (*)     All other components within normal limits  CBC - Abnormal; Notable for the following:    RBC 3.61 (*)     Hemoglobin 11.6 (*)     HCT 34.5 (*)     All other components within normal limits  BASIC METABOLIC PANEL -  Abnormal; Notable for the following:    Glucose, Bld 128 (*)     BUN 24 (*)     GFR calc non Af Amer 49 (*)     GFR calc Af Amer 56 (*)     All other components within normal limits  POCT I-STAT TROPONIN I  URINALYSIS, ROUTINE  W REFLEX MICROSCOPIC   Dg Chest 2 View  09/02/2011  *RADIOLOGY REPORT*  Clinical Data: Fever and weakness.  CHEST - 2 VIEW  Comparison: Chest radiograph performed 06/29/2011  Findings: The lungs are well-aerated.  Mild bibasilar airspace opacities likely reflect atelectasis.  There is no evidence of pleural effusion or pneumothorax.  A few small calcified granulomata are seen.  The heart is normal in size; the mediastinal contour is within normal limits.  No acute osseous abnormalities are seen.  IMPRESSION: Mild bibasilar airspace opacities likely reflect atelectasis; lungs otherwise clear.  Original Report Authenticated By: Tonia Ghent, M.D.     No diagnosis found.    MDM  This is a 78 year old female who presents today with generalized fatigue and fevers at home. States that yesterday she felt mild fatigue as well as a mild headache that resolved on its own. In today and they were having a family cookout, and she felt some chills, a mild headache, and some generalized fatigue; a family member took her temperature and it was elevated to 101.2 so she was given Tylenol with resolution of her fever and her headache. She denies any focal infectious symptoms. Family is concerned she had a "silent heart attack" back in May of this year. Review of records shows that she presented to her regular doctor with a sudden onset of left jaw pain that woke her up from sleep, she was therefore referred for cardiac stress imaging which did show inducible ischemia. The patient is not currently having any similar symptoms. The patient does have mild suprapubic tenderness on exam her exam is otherwise unremarkable. We'll check basic labs, chest x-ray, urine to evaluate for possible etiology  of her fever. Does strongly doubt that she is having a recurrent MI given the fact that she is having a fever, will check a troponin due to her previous MI being with minimal symptoms.   At this point on the patient's lab work and chest x-ray is unremarkable; she does not have any hyponatremia or thrombocytopenia to suggest [facility] spotted fever, that is only the first day of her symptoms.. Her urine has been collected and the results are pending. The patient will be disposed upon final results of her urine, to be followed up by Dr. Patria Mane. It is negative the patient can be treated symptomatically with close followup with regular doctor if symptoms persist.     Theotis Burrow, MD 09/03/11 [phone]

## 2011-09-02 NOTE — ED Notes (Signed)
[place] noticed that she felt hot took temp. Temp was 101.2 pt given tylenol. Pt continued to feel warm to touch, pt states generalized weakness and fatigue with syptoms. Pt had stent placed in May 2013.

## 2011-09-02 NOTE — ED Notes (Signed)
CBG: 64 Triage nurse has been notified

## 2011-09-02 NOTE — ED Notes (Signed)
CBG was 64. Notified Nurse.

## 2011-09-03 LAB — URINALYSIS, ROUTINE W REFLEX MICROSCOPIC
Bilirubin Urine: NEGATIVE
Glucose, UA: NEGATIVE mg/dL
Hgb urine dipstick: NEGATIVE
Protein, ur: NEGATIVE mg/dL
Specific Gravity, Urine: 1.015 (ref 1.005–1.030)

## 2011-09-03 LAB — URINE MICROSCOPIC-ADD ON

## 2011-09-03 NOTE — ED Provider Notes (Signed)
I saw and evaluated the patient, reviewed the resident's note and I agree with the findings and plan.  The patient is very well appearing.  There is no obvious source for her fever at this time.  Her abdominal exam is benign.  Her chest and urine demonstrate no evidence of infection.  This is the first day of fever.  Discharge home with strict instructions to followup with her primary care Dr. return the emergency apartment for new or worsening symptoms.  The encounter diagnosis was Fever.  Results for orders placed during the hospital encounter of 09/02/11  GLUCOSE, CAPILLARY      Component Value Range   Glucose-Capillary 64 (*) 70 - 99 mg/dL  CBC      Component Value Range   WBC 5.9  4.0 - 10.5 K/uL   RBC 3.61 (*) 3.87 - 5.11 MIL/uL   Hemoglobin 11.6 (*) 12.0 - 15.0 g/dL   HCT 96.0 (*) 45.4 - 09.8 %   MCV 95.6  78.0 - 100.0 fL   MCH 32.1  26.0 - 34.0 pg   MCHC 33.6  30.0 - 36.0 g/dL   RDW 11.9  14.7 - 82.9 %   Platelets 206  150 - 400 K/uL  BASIC METABOLIC PANEL      Component Value Range   Sodium 137  135 - 145 mEq/L   Potassium 3.8  3.5 - 5.1 mEq/L   Chloride 101  96 - 112 mEq/L   CO2 25  19 - 32 mEq/L   Glucose, Bld 128 (*) 70 - 99 mg/dL   BUN 24 (*) 6 - 23 mg/dL   Creatinine, Ser 5.62  0.50 - 1.10 mg/dL   Calcium 13.0  8.4 - 86.5 mg/dL   GFR calc non Af Amer 49 (*) >90 mL/min   GFR calc Af Amer 56 (*) >90 mL/min  URINALYSIS, ROUTINE W REFLEX MICROSCOPIC      Component Value Range   Color, Urine YELLOW  YELLOW   APPearance CLEAR  CLEAR   Specific Gravity, Urine 1.015  1.005 - 1.030   pH 6.0  5.0 - 8.0   Glucose, UA NEGATIVE  NEGATIVE mg/dL   Hgb urine dipstick NEGATIVE  NEGATIVE   Bilirubin Urine NEGATIVE  NEGATIVE   Ketones, ur NEGATIVE  NEGATIVE mg/dL   Protein, ur NEGATIVE  NEGATIVE mg/dL   Urobilinogen, UA 0.2  0.0 - 1.0 mg/dL   Nitrite NEGATIVE  NEGATIVE   Leukocytes, UA TRACE (*) NEGATIVE  POCT I-STAT TROPONIN I      Component Value Range   Troponin i, poc  0.00  0.00 - 0.08 ng/mL   Comment 3           URINE MICROSCOPIC-ADD ON      Component Value Range   Squamous Epithelial / LPF FEW (*) RARE   WBC, UA 3-6  <3 WBC/hpf   RBC / HPF 0-2  <3 RBC/hpf   Bacteria, UA RARE  RARE   Dg Chest 2 View  09/02/2011  *RADIOLOGY REPORT*  Clinical Data: Fever and weakness.  CHEST - 2 VIEW  Comparison: Chest radiograph performed 06/29/2011  Findings: The lungs are well-aerated.  Mild bibasilar airspace opacities likely reflect atelectasis.  There is no evidence of pleural effusion or pneumothorax.  A few small calcified granulomata are seen.  The heart is normal in size; the mediastinal contour is within normal limits.  No acute osseous abnormalities are seen.  IMPRESSION: Mild bibasilar airspace opacities likely reflect atelectasis; lungs otherwise  clear.  Original Report Authenticated By: Tonia Ghent, M.D.   I personally reviewed the imaging tests through PACS system  I reviewed available ER/hospitalization records thought the EMR   Lyanne Co, MD 09/03/11 [phone]

## 2011-09-18 ENCOUNTER — Other Ambulatory Visit: Payer: Self-pay | Admitting: Obstetrics

## 2011-09-18 DIAGNOSIS — Z1231 Encounter for screening mammogram for malignant neoplasm of breast: Secondary | ICD-10-CM

## 2011-09-25 ENCOUNTER — Encounter: Payer: Self-pay | Admitting: Gastroenterology

## 2011-09-28 ENCOUNTER — Encounter: Payer: Self-pay | Admitting: Cardiology

## 2011-10-03 ENCOUNTER — Ambulatory Visit: Payer: Medicare Other

## 2011-10-16 ENCOUNTER — Telehealth: Payer: Self-pay | Admitting: Internal Medicine
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
## 2011-11-28 DIAGNOSIS — R0602 Shortness of breath: Secondary | ICD-10-CM

## 2011-11-28 DIAGNOSIS — M48061 Spinal stenosis, lumbar region without neurogenic claudication: Secondary | ICD-10-CM

## 2011-11-28 DIAGNOSIS — I498 Other specified cardiac arrhythmias: Secondary | ICD-10-CM

## 2011-11-28 DIAGNOSIS — I495 Sick sinus syndrome: Secondary | ICD-10-CM

## 2011-11-28 HISTORY — DX: Shortness of breath: R06.02

## 2011-11-28 HISTORY — DX: Spinal stenosis, lumbar region without neurogenic claudication: M48.061

## 2011-11-28 HISTORY — DX: Sick sinus syndrome: I49.5

## 2011-11-28 HISTORY — DX: Other specified cardiac arrhythmias: I49.8

## 2011-12-04 LAB — PULMONARY FUNCTION TEST

## 2011-12-05 ENCOUNTER — Ambulatory Visit: Payer: Self-pay | Admitting: Specialist

## 2012-05-16 ENCOUNTER — Ambulatory Visit: Payer: Self-pay | Admitting: Cardiology

## 2012-05-16 DIAGNOSIS — I480 Paroxysmal atrial fibrillation: Secondary | ICD-10-CM | POA: Insufficient documentation

## 2012-05-16 DIAGNOSIS — Z7901 Long term (current) use of anticoagulants: Secondary | ICD-10-CM | POA: Insufficient documentation

## 2012-07-17 ENCOUNTER — Ambulatory Visit: Payer: Medicare Other | Admitting: Pharmacist Clinician (PhC)/ Clinical Pharmacy Specialist

## 2012-07-18 ENCOUNTER — Ambulatory Visit (INDEPENDENT_AMBULATORY_CARE_PROVIDER_SITE_OTHER): Payer: Medicare Other | Admitting: Pharmacist Clinician (PhC)/ Clinical Pharmacy Specialist

## 2012-07-18 VITALS — BP 160/78 | HR 72

## 2012-07-18 DIAGNOSIS — I4891 Unspecified atrial fibrillation: Secondary | ICD-10-CM

## 2012-07-18 DIAGNOSIS — Z7901 Long term (current) use of anticoagulants: Secondary | ICD-10-CM

## 2012-07-26 ENCOUNTER — Other Ambulatory Visit: Payer: Self-pay | Admitting: Cardiology

## 2012-07-30 ENCOUNTER — Other Ambulatory Visit: Payer: Self-pay | Admitting: Pharmacist Clinician (PhC)/ Clinical Pharmacy Specialist

## 2012-07-30 ENCOUNTER — Other Ambulatory Visit: Payer: Self-pay | Admitting: Cardiology

## 2012-07-30 MED ORDER — WARFARIN SODIUM 5 MG PO TABS: ORAL_TABLET | ORAL | Status: DC

## 2012-08-12 ENCOUNTER — Encounter: Payer: Self-pay | Admitting: Cardiology

## 2012-08-13 ENCOUNTER — Encounter: Payer: Self-pay | Admitting: Cardiology

## 2012-08-14 ENCOUNTER — Ambulatory Visit (INDEPENDENT_AMBULATORY_CARE_PROVIDER_SITE_OTHER): Payer: Medicare Other | Admitting: Pharmacist Clinician (PhC)/ Clinical Pharmacy Specialist

## 2012-08-14 ENCOUNTER — Encounter: Payer: Self-pay | Admitting: Cardiology

## 2012-08-14 ENCOUNTER — Ambulatory Visit (INDEPENDENT_AMBULATORY_CARE_PROVIDER_SITE_OTHER): Payer: Medicare Other | Admitting: Cardiology

## 2012-08-14 ENCOUNTER — Other Ambulatory Visit: Payer: Self-pay | Admitting: Pharmacist Clinician (PhC)/ Clinical Pharmacy Specialist

## 2012-08-14 VITALS — BP 138/70 | HR 70 | Ht 63.0 in | Wt 166.9 lb

## 2012-08-14 DIAGNOSIS — I480 Paroxysmal atrial fibrillation: Secondary | ICD-10-CM

## 2012-08-14 DIAGNOSIS — Z7901 Long term (current) use of anticoagulants: Secondary | ICD-10-CM

## 2012-08-14 DIAGNOSIS — I2510 Atherosclerotic heart disease of native coronary artery without angina pectoris: Secondary | ICD-10-CM

## 2012-08-14 DIAGNOSIS — I4891 Unspecified atrial fibrillation: Secondary | ICD-10-CM

## 2012-08-14 DIAGNOSIS — I10 Essential (primary) hypertension: Secondary | ICD-10-CM

## 2012-08-14 DIAGNOSIS — I4589 Other specified conduction disorders: Secondary | ICD-10-CM

## 2012-08-14 DIAGNOSIS — E785 Hyperlipidemia, unspecified: Secondary | ICD-10-CM

## 2012-08-14 MED ORDER — WARFARIN SODIUM 5 MG PO TABS: ORAL_TABLET | ORAL | Status: DC

## 2012-08-14 NOTE — Progress Notes (Signed)
Patient ID: Abigail Boyd, female   DOB: 01/31/1930, 79 y.o.   MRN: [id number]    Clinic Note: HPI: Abigail Boyd is a 79 y.o. female with a PMH below who presents today for routine followup. She is a very pleasant woman, and former patient of Dr. Julieanne Manson, who I first met her on a performed her cardiac catheterization and PCI in May 2013 after an abnormal Myoview in April of that year. Her symptoms that she described at that time were left-sided jaw pain with shortness of breath and was made worse with exertion -- she did not note chest discomfort until the jaw pain radiated into her chest and throat. That was what led to a stress test.  She also has a history of proximal atrial fibrillation and had a CPET (cardiopulmonary exercise test) evaluation for shortness of breath back in the fall and this showed contributing competence. We have reduced her beta blocker dose since then. She does not take ACE inhibitors or ARB due to cough. Therefore she is on amlodipine.  I last saw her in February, and she was doing relatively well, having just noticed one episode of a strange right-sided neck pain. It is not similar to her previous stent.  Interval History: Today she is doing quite well since she's been bothered more because of arthritic pains in her knees than anything else. She really denies any major symptoms of chest discomfort or shortness of breath with rest or exertion she does get short of breath if she goes up and down her steps several times but that also may because her knees are bothering her more. She has not had any more of her anginal equivalent which is jaw pain. She denies any PND, orthopnea or edema. She denies any significant palpitations or lightheadedness, dizziness. No syncope or near-syncope. No TIA or amaurosis fugax symptoms. Her shortness of breath that she had back in December has improved with low-dose beta blocker. She is on Plavix as well as warfarin and denies any melena,  hematochezia or hematuria.  Remaining she's been complaining about is her knees hurting and it really been decreasing her ability to do Silver sneakers. She is due to get an MRI of that knee in the next couple days and she asked about taking a medicine called Vimovo for her arthritic pain.  Past Medical History  Diagnosis Date  . Allergy   . Anxiety     no meds  . Cataract   . Glaucoma   . Diabetes mellitus     On oral medications  . GERD (gastroesophageal reflux disease)   . Hyperlipidemia   . Hypertension   . Chronic kidney disease     hx bladder infections  . Spinal stenosis   . Abnormal nuclear stress test 06/21/2011    Inferolateral reversible defect; he underwent cardiac catheterization and PCI of circumflex OM, occluded RCA with collaterals  . Unstable angina - history of 06/13/2011    Jaw pain awakening from sleep  . Coronary artery disease     s/P PCI to proximal OM1 w/ DES; occluded RCA with bridging and left to right collaterals  . Shortness of breath 11/2011    Evaluated with CPET - peak VO2 97%  . Arthritis   . Dyslipidemia   . Spinal stenosis   . PAF (paroxysmal atrial fibrillation)   . Chronotropic incompetence 11/2011    On CPET test; beta blockers reduced    Prior Cardiac Evaluation and Past Surgical History: Past  Surgical History  Procedure Laterality Date  . Cataracts      both eyes  . Colonoscopy    . Vaginal hysterectomy    . Breast biopsy      both breast  . Cardiac catheterization  07/05/2011    Proximal OM1 lesion --> PCI; 100% mid RCA with right to right and left to right bridging collaterals from circumflex RPL and LAD the PDA.  Marland Kitchen Coronary angioplasty with stent placement  07/05/2011    Promus Element DES 2.5 mm x 16 mm-post dilated to 2.65 mm CX-Proximal OM1    Allergies  Allergen Reactions  . Lisinopril Cough  . Losartan Cough  . Sulfa Antibiotics Hives    Current Outpatient Prescriptions  Medication Sig Dispense Refill  .  ALPRAZolam (XANAX) 0.25 MG tablet Take 0.25 mg by mouth daily as needed. For anxiety      . amLODipine (NORVASC) 5 MG tablet Take 5 mg by mouth daily.      Marland Kitchen azelastine (ASTELIN) 137 MCG/SPRAY nasal spray Place 1 spray into both nostrils at bedtime.       . benzonatate (TESSALON) 100 MG capsule Take 100 mg by mouth 3 (three) times daily as needed for cough.      . Cholecalciferol (VITAMIN D3) 2000 UNITS TABS Take 1 tablet by mouth daily.       . clopidogrel (PLAVIX) 75 MG tablet Take 75 mg by mouth daily.      Marland Kitchen Dexlansoprazole (DEXILANT PO) Take by mouth.      . docusate sodium (COLACE) 100 MG capsule Take 100 mg by mouth 2 (two) times daily as needed for constipation.      . fexofenadine (ALLEGRA) 180 MG tablet Take 180 mg by mouth daily.      . fish oil-omega-3 fatty acids 1000 MG capsule Take 2 g by mouth daily.      Marland Kitchen glimepiride (AMARYL) 1 MG tablet Take 1 mg by mouth daily before breakfast.      . magnesium oxide (MAG-OX) 400 MG tablet Take 400 mg by mouth daily.      . metFORMIN (GLUCOPHAGE) 500 MG tablet Take 1,000 mg by mouth 2 (two) times daily with a meal. Do not take till 5/12      . metoprolol tartrate (LOPRESSOR) 25 MG tablet Take 25 mg by mouth 2 (two) times daily.      Marland Kitchen PARoxetine (PAXIL) 10 MG tablet Take 10 mg by mouth every morning.       . rosuvastatin (CRESTOR) 20 MG tablet Take 10 mg by mouth daily.      Marland Kitchen ipratropium (ATROVENT) 0.06 % nasal spray Place 1 spray into the nose Daily.       . nitroGLYCERIN (NITROSTAT) 0.4 MG SL tablet Place 1 tablet (0.4 mg total) under the tongue every 5 (five) minutes as needed for chest pain.  25 tablet  2  . warfarin (COUMADIN) 5 MG tablet Take 1 & 1/2 tablets by mouth daily or as directed by coumadin clinic  135 tablet  2   No current facility-administered medications for this visit.    History   Social History  . Marital Status: Married    Spouse Name: N/A    Number of Children: N/A  . Years of Education: N/A   Occupational  History  . Not on file.   Social History Main Topics  . Smoking status: Never Smoker   . Smokeless tobacco: Never Used  . Alcohol Use: No  . Drug Use:  No  . Sexually Active: No   Other Topics Concern  . Not on file   Social History Narrative   Widowed mother of one, grandmother 2. Has not been using silver take as much lately due to knee discomfort. Usually exercises with Silver Sneakers on regular basis.    ROS: A comprehensive Review of Systems - Negative except Pertinent positives above. Musculoskeletal ROS: positive for - joint pain, joint swelling and pain in knee - right, This causes her to have some swelling of that ankle as well.  PHYSICAL EXAM BP 138/70  Pulse 70  Ht 5\' 3"  (1.6 m)  Wt 166 lb 14.4 oz (75.705 kg)  BMI 29.57 kg/m2 General appearance: alert, cooperative, appears stated age, no distress and Very pleasant mood and affect appeared well-nourished, well-groomed. Neck: no adenopathy, no carotid bruit, no JVD, supple, symmetrical, trachea midline and thyroid not enlarged, symmetric, no tenderness/mass/nodules Lungs: clear to auscultation bilaterally, normal percussion bilaterally and Nonlabored, good air movement Heart: regular rate and rhythm, S1, S2 normal, no S3 or S4, systolic murmur: systolic ejection 1/6, medium pitch, crescendo and decrescendo at 2nd right intercostal space, radiates to carotids and no rub Abdomen: soft, non-tender; bowel sounds normal; no masses,  no organomegaly Extremities: extremities normal, atraumatic, no cyanosis or edema, no edema, redness or tenderness in the calves or thighs, no ulcers, gangrene or trophic changes and varicose veins noted Pulses: 2+ and symmetric Neurologic: Mental status: Alert, oriented, thought content appropriate, She does have some mild forgetfulness. Cranial nerves: normal [place]/AT, EOMI, MMM, anicteric sclera  [id number] today: Yes Rate: 70 , Rhythm: Normal sinus, normal ECG with baseline wander;    ASSESSMENT: Overall relatively stable cardiac standpoint.  CAD, total RCA with collat, OM1 DES this admission  Hypertension  PAF (paroxysmal atrial fibrillation)  Chronotropic incompetence - partially medication related  Long term (current) use of anticoagulants  Hyperlipidemia  PLAN: Per problem list.   Followup: One year  HARDING,DAVID W, M.D., M.S. [facility] [address] [place], [place]  [address]  [phone] Pager # [phone] 08/18/2012 3:23 PM

## 2012-08-14 NOTE — Patient Instructions (Addendum)
Your physician has recommended you make the following change in your medication Metoprolol 25 mg take 1/2 tablet twice a day   If someone is asking you about chest pain/angina your symptom was jaw pain not chest pain.  If you need surgery in the future,please have surgeon call for clearance it will be okay to stop Plavix and we will discuss about warfarin if necessary  Your physician wants you to follow-up in 12 months Dr Herbie Baltimore. You will receive a reminder letter in the mail two months in advance. If you don't receive a letter, please call our office to schedule the follow-up appointment.

## 2012-08-15 ENCOUNTER — Other Ambulatory Visit: Payer: Self-pay | Admitting: Cardiovascular Disease

## 2012-08-16 ENCOUNTER — Ambulatory Visit: Payer: Medicare Other | Admitting: Pharmacist Clinician (PhC)/ Clinical Pharmacy Specialist

## 2012-08-18 ENCOUNTER — Encounter: Payer: Self-pay | Admitting: Cardiology

## 2012-08-18 DIAGNOSIS — I4589 Other specified conduction disorders: Secondary | ICD-10-CM | POA: Insufficient documentation

## 2012-08-18 NOTE — Assessment & Plan Note (Signed)
Well-controlled on beta blocker and calcium channel blocker.

## 2012-08-18 NOTE — Assessment & Plan Note (Signed)
Doing very well, with no active anginal symptoms. She did have a a quite specific symptom of her angina which is actually left-sided jaw pain that then radiated into her chest. I wanted to remind her that that was her symptom was the case and we asked her chest pain and she's has a musculoskeletal type pain that is not to be confused with her angina. She is on Plavix, statin and low-dose beta blocker as well as amlodipine. Her peak VO2% was very good on her CPET -- 97%, despite chronotropic incompetence. This was initially done quite well from a cardiovascular standpoint.

## 2012-08-18 NOTE — Assessment & Plan Note (Signed)
Followed by Phillips Hay , Pharm.D. our office.

## 2012-08-18 NOTE — Assessment & Plan Note (Addendum)
On low-dose beta blocker, that she is supposed to taking 12.5 mg (one half tab of 25 mg) twice a day of metoprolol. Unfortunately she was taken 1 tablet daily.  I have instructed her to make sure she takes it one half tab twice a day. I think easily had one episode of this which was reportedly from a hospitalization in it a [facility] or [facility]. When she came in with symptoms of syncope. As far as much anymore since then.  He will be nice to no if this is an issue because it is not we can stop the warfarin.

## 2012-08-18 NOTE — Assessment & Plan Note (Signed)
Overall seems to be doing better with a lower dose of beta blocker. Once her knee is better at to see how well she would do if we repeat the CPET when I see her back next year.

## 2012-08-18 NOTE — Assessment & Plan Note (Addendum)
On Crestor 10 mg daily,  Which is as much as she can tolerate. I tried to have her increase the dose to 20 mg every other day, but she does not seem to be able to remember to do that. She tentatively that her labs are being checked by her primary physician. I last note indicated that were intact and. At it she has not made to her medications I think which contain a monitor assessment check for see her back we'll see her back next year.

## 2012-09-11 ENCOUNTER — Ambulatory Visit (INDEPENDENT_AMBULATORY_CARE_PROVIDER_SITE_OTHER): Payer: Medicare Other | Admitting: Pharmacist Clinician (PhC)/ Clinical Pharmacy Specialist

## 2012-09-11 VITALS — BP 120/58 | HR 68

## 2012-09-11 DIAGNOSIS — I480 Paroxysmal atrial fibrillation: Secondary | ICD-10-CM

## 2012-09-11 DIAGNOSIS — I4891 Unspecified atrial fibrillation: Secondary | ICD-10-CM

## 2012-09-11 DIAGNOSIS — Z7901 Long term (current) use of anticoagulants: Secondary | ICD-10-CM

## 2012-09-11 LAB — POCT INR: INR: 1.9

## 2012-09-23 ENCOUNTER — Telehealth: Payer: Self-pay | Admitting: Cardiology

## 2012-09-23 NOTE — Telephone Encounter (Signed)
Please call question about her blood pressure medicine-been having swelling in her ankle for the last 3 days.

## 2012-09-23 NOTE — Telephone Encounter (Signed)
Returned call.  Pt stated she has never had swelling in her feet and both feet and ankles have been swelling for the last 3 days.  Stated she went to her family doctor to get her blood drawn last Friday and he told her she has been eating too many tomatoes and salt.  Stated she might eat 1 tomato a day.  Denied CP, SOB, numbness or tingling.  Stated swelling was down this morning and now her "ankles are fat."    Advice  Low sodium diet (read labels)  Elevate BLEs above level of the heart when sitting or lying down  Elevate foot of bed at night (log roll blanket under mattress or use extra pillows) Wear compression stockings, if pt has them  Pt verbalized understanding and agreed w/ plan.  Pt will call back if no improvement in the next 48 hrs or sooner for worsening symptoms.

## 2012-10-09 ENCOUNTER — Ambulatory Visit (INDEPENDENT_AMBULATORY_CARE_PROVIDER_SITE_OTHER): Payer: Medicare Other | Admitting: Pharmacist Clinician (PhC)/ Clinical Pharmacy Specialist

## 2012-10-09 VITALS — BP 118/60 | HR 76

## 2012-10-09 DIAGNOSIS — Z7901 Long term (current) use of anticoagulants: Secondary | ICD-10-CM

## 2012-10-09 DIAGNOSIS — I480 Paroxysmal atrial fibrillation: Secondary | ICD-10-CM

## 2012-10-09 DIAGNOSIS — I4891 Unspecified atrial fibrillation: Secondary | ICD-10-CM

## 2012-10-09 LAB — POCT INR: INR: 2.4

## 2012-11-06 ENCOUNTER — Ambulatory Visit (INDEPENDENT_AMBULATORY_CARE_PROVIDER_SITE_OTHER): Payer: Medicare Other | Admitting: Pharmacist Clinician (PhC)/ Clinical Pharmacy Specialist

## 2012-11-06 VITALS — BP 122/56 | HR 72

## 2012-11-06 DIAGNOSIS — Z7901 Long term (current) use of anticoagulants: Secondary | ICD-10-CM

## 2012-11-06 DIAGNOSIS — I480 Paroxysmal atrial fibrillation: Secondary | ICD-10-CM

## 2012-11-06 DIAGNOSIS — I4891 Unspecified atrial fibrillation: Secondary | ICD-10-CM

## 2012-11-08 ENCOUNTER — Ambulatory Visit: Payer: Medicare Other | Admitting: Cardiovascular Disease

## 2012-11-26 ENCOUNTER — Other Ambulatory Visit: Payer: Self-pay | Admitting: Obstetrics

## 2012-11-26 DIAGNOSIS — Z1231 Encounter for screening mammogram for malignant neoplasm of breast: Secondary | ICD-10-CM

## 2012-11-27 DIAGNOSIS — I495 Sick sinus syndrome: Secondary | ICD-10-CM

## 2012-11-27 HISTORY — DX: Sick sinus syndrome: I49.5

## 2012-11-27 HISTORY — PX: NM MYOVIEW LTD: HXRAD82

## 2012-12-03 ENCOUNTER — Ambulatory Visit
Admission: RE | Admit: 2012-12-03 | Discharge: 2012-12-03 | Disposition: A | Discharge status: Home / Self Care | Payer: Medicare Other | Source: Ambulatory Visit | Attending: Obstetrics | Admitting: Obstetrics

## 2012-12-03 ENCOUNTER — Ambulatory Visit: Payer: Medicare Other

## 2012-12-03 DIAGNOSIS — Z1231 Encounter for screening mammogram for malignant neoplasm of breast: Secondary | ICD-10-CM

## 2012-12-04 ENCOUNTER — Ambulatory Visit (INDEPENDENT_AMBULATORY_CARE_PROVIDER_SITE_OTHER): Payer: Medicare Other | Admitting: Pharmacist Clinician (PhC)/ Clinical Pharmacy Specialist

## 2012-12-04 VITALS — BP 140/60 | HR 80

## 2012-12-04 DIAGNOSIS — Z7901 Long term (current) use of anticoagulants: Secondary | ICD-10-CM

## 2012-12-04 DIAGNOSIS — I4891 Unspecified atrial fibrillation: Secondary | ICD-10-CM

## 2012-12-04 DIAGNOSIS — I480 Paroxysmal atrial fibrillation: Secondary | ICD-10-CM

## 2012-12-07 DIAGNOSIS — I480 Paroxysmal atrial fibrillation: Secondary | ICD-10-CM

## 2012-12-07 HISTORY — DX: Paroxysmal atrial fibrillation: I48.0

## 2012-12-17 ENCOUNTER — Telehealth: Payer: Self-pay | Admitting: Cardiology

## 2012-12-17 ENCOUNTER — Encounter: Payer: Self-pay | Admitting: Cardiology

## 2012-12-17 ENCOUNTER — Ambulatory Visit (INDEPENDENT_AMBULATORY_CARE_PROVIDER_SITE_OTHER): Payer: Medicare Other | Admitting: Cardiology

## 2012-12-17 VITALS — BP 130/72 | HR 70 | Ht 63.5 in | Wt 166.7 lb

## 2012-12-17 DIAGNOSIS — I2510 Atherosclerotic heart disease of native coronary artery without angina pectoris: Secondary | ICD-10-CM

## 2012-12-17 DIAGNOSIS — R5383 Other fatigue: Secondary | ICD-10-CM

## 2012-12-17 DIAGNOSIS — I4891 Unspecified atrial fibrillation: Secondary | ICD-10-CM

## 2012-12-17 DIAGNOSIS — I480 Paroxysmal atrial fibrillation: Secondary | ICD-10-CM

## 2012-12-17 DIAGNOSIS — R5381 Other malaise: Secondary | ICD-10-CM

## 2012-12-17 DIAGNOSIS — E119 Type 2 diabetes mellitus without complications: Secondary | ICD-10-CM

## 2012-12-17 DIAGNOSIS — Z7901 Long term (current) use of anticoagulants: Secondary | ICD-10-CM

## 2012-12-17 DIAGNOSIS — M542 Cervicalgia: Secondary | ICD-10-CM

## 2012-12-17 DIAGNOSIS — R000 Tachycardia, unspecified: Secondary | ICD-10-CM

## 2012-12-17 DIAGNOSIS — I4589 Other specified conduction disorders: Secondary | ICD-10-CM

## 2012-12-17 NOTE — Assessment & Plan Note (Signed)
Her angina was only bilateral jaw and cheek pain.  Will eval. For ishcemia, with lexiscan myoview.

## 2012-12-17 NOTE — Addendum Note (Signed)
Addended by: Tobin Chad on: 12/17/2012 04:49 PM   Modules accepted: Orders

## 2012-12-17 NOTE — Assessment & Plan Note (Signed)
On coumadin and therapeutic at last appt.

## 2012-12-17 NOTE — Assessment & Plan Note (Signed)
Episode of atrial fib today at cardiac rehab.  Now back in SR.  Pt was without acute symptoms, though she does admit to DOE.  This has increased, she has some bil neck pain episodically ( her angina was jaw pain only).  She is anticoagulated on coumadin.  She is on BB but very fatigued.  Previous Met Test with chronotropic incompetence with decrease in BB.  She had increased energy for awhile but now has had more fatigue.  Plan will be event monitor to eval atrial fib burden.  We will also have lexscan myoview done to eval for ischemia with neck pain.  She will then follow up with Dr. Herbie Baltimore for results and they will decide or anti arrhythmic vs ablation.

## 2012-12-17 NOTE — Patient Instructions (Signed)
Wear monitor.  Have lab work done.  Have Lexiscan myoview/ stress test done.  Follow up with Dr. Herbie Baltimore for results and further plan for PAF.  Call if increasing neck pain or shortness of breath.

## 2012-12-17 NOTE — Telephone Encounter (Signed)
Fannie Knee, nurse w/ Cardiac Rehab, called.  Stated pt in for exercise and stated something didn't feel right.  HR checked and pt placed on monitor b/c it was irregular.  Stated pt was in Afib at 80-150 bpm and now 80-110 bpm.  Stated pt is asymptomatic and she thinks pt is fine.  Pt leaving in the nex t 10-15 mins and cell number is [phone].  Informed Extender will be notified as Dr. Herbie Baltimore is out of the office today and RN will call pt back.  Verbalized understanding.  Vernona Rieger, NP notified and advised pt come in today for evaluation at next available opening.    Call to pt and informed.  Also informed Fannie Knee who was still w/ pt.  Pt agreed to come in at 2pm.

## 2012-12-17 NOTE — Progress Notes (Signed)
12/17/2012   PCP: Raliegh Ip, MD   Chief Complaint  Patient presents with  . Follow-up    went to cardiac rehab this morning and was told she was in Afib, have a little headache since this morning    Primary Cardiologist: Dr. Herbie Baltimore  HPI:  79 year old presenting today for PAF.   SHe went to cardiac rehab and at check in was found to be in atrial fib with rates up to 150.  She called our office and we asked her to come in.  She is now back in SR.  She does relate to not feeling well lately, last week or so.   Her symptoms include increased DOE and occ bilateral neck pain, though not into her jaws.  No nausea, no diaphoresis.   Her cardiac hx includes cardiac catheterization and PCI in May 2013 after an abnormal Myoview in April of that year. Her symptoms that she described at that time were left-sided jaw pain with shortness of breath and was made worse with exertion -- she did not note chest discomfort until the jaw pain radiated into her chest and throat. That was what led to a stress test.  Cath revealed:  Severe 2 vessel CAD with 100% mid RCA occlusion (R-L bridging collaterals, LCx-RPL & LAD/SP-RPDA collaterals) as well as a ~60-80% mid Left Circumflex-OM1 lesion that was noted to be hemodynamically significant by FFR of 0.79. She underwent successful PCI of mid LCx-OM1 with Promus Element DES 2.5 mm x 16 mm - post dilated with stent balloon to 2.65 mm.    She also has a history of proximal atrial fibrillation and had a CPET (cardiopulmonary exercise test) evaluation for shortness of breath a year ago and this showed contributing competence. We had reduced her beta blocker dose since then. She does not take ACE inhibitors or ARB due to cough. Therefore she is on amlodipine.    Allergies  Allergen Reactions  . Lisinopril Cough  . Losartan Cough  . Sulfa Antibiotics Hives    Current Outpatient Prescriptions  Medication Sig Dispense Refill  . ALPRAZolam  (XANAX) 0.25 MG tablet Take 0.25 mg by mouth daily as needed. For anxiety      . amLODipine (NORVASC) 5 MG tablet Take 5 mg by mouth daily.      Marland Kitchen azelastine (ASTELIN) 137 MCG/SPRAY nasal spray Place 1 spray into both nostrils at bedtime.       . benzonatate (TESSALON) 100 MG capsule Take 100 mg by mouth 3 (three) times daily as needed for cough.      . Cholecalciferol (VITAMIN D3) 2000 UNITS TABS Take 1 tablet by mouth daily.       . clopidogrel (PLAVIX) 75 MG tablet Take 75 mg by mouth daily.      Marland Kitchen Dexlansoprazole (DEXILANT PO) Take by mouth.      . docusate sodium (COLACE) 100 MG capsule Take 100 mg by mouth 2 (two) times daily as needed for constipation.      . fexofenadine (ALLEGRA) 180 MG tablet Take 180 mg by mouth daily.      . fish oil-omega-3 fatty acids 1000 MG capsule Take 2 g by mouth daily.      Marland Kitchen glimepiride (AMARYL) 1 MG tablet Take 2 mg by mouth daily before breakfast.       . ipratropium (ATROVENT) 0.06 % nasal spray Place 1 spray into the nose Daily.       Marland Kitchen  magnesium oxide (MAG-OX) 400 MG tablet Take 400 mg by mouth daily.      . metFORMIN (GLUCOPHAGE) 500 MG tablet Take 1,000 mg by mouth 2 (two) times daily with a meal. Do not take till 5/12      . metoprolol tartrate (LOPRESSOR) 25 MG tablet Take 25 mg by mouth 2 (two) times daily.      . nitroGLYCERIN (NITROSTAT) 0.4 MG SL tablet Place 1 tablet (0.4 mg total) under the tongue every 5 (five) minutes as needed for chest pain.  25 tablet  2  . PARoxetine (PAXIL) 10 MG tablet Take 10 mg by mouth every morning.       . rosuvastatin (CRESTOR) 20 MG tablet Take 10 mg by mouth daily.      Marland Kitchen warfarin (COUMADIN) 5 MG tablet Take 1 & 1/2 tablets by mouth daily or as directed by coumadin clinic  135 tablet  2   No current facility-administered medications for this visit.    Past Medical History  Diagnosis Date  . Allergy   . Anxiety     no meds  . Cataract   . Glaucoma   . Diabetes mellitus     On oral medications  . GERD  (gastroesophageal reflux disease)   . Hyperlipidemia   . Hypertension   . Chronic kidney disease     hx bladder infections  . Spinal stenosis   . Abnormal nuclear stress test 06/21/2011    Inferolateral reversible defect; he underwent cardiac catheterization and PCI of circumflex OM, occluded RCA with collaterals  . Unstable angina - history of 06/13/2011    Jaw pain awakening from sleep  . Coronary artery disease     s/P PCI to proximal OM1 w/ DES; occluded RCA with bridging and left to right collaterals  . Shortness of breath 11/2011    Evaluated with CPET - peak VO2 97%  . Arthritis   . Dyslipidemia   . Spinal stenosis   . PAF (paroxysmal atrial fibrillation)   . Chronotropic incompetence 11/2011    On CPET test; beta blockers reduced    Past Surgical History  Procedure Laterality Date  . Cataracts      both eyes  . Colonoscopy    . Vaginal hysterectomy    . Breast biopsy      both breast  . Cardiac catheterization  07/05/2011    Proximal OM1 lesion --> PCI; 100% mid RCA with right to right and left to right bridging collaterals from circumflex RPL and LAD the PDA.  Marland Kitchen Coronary angioplasty with stent placement  07/05/2011    Promus Element DES 2.5 mm x 16 mm-post dilated to 2.65 mm CX-Proximal OM1    ZOX:WRUEAVW:UJ colds or fevers, no weight changes Skin:no rashes or ulcers HEENT:no blurred vision, no congestion CV:see HPI PUL:see HPI GI:no diarrhea constipation or melena, no indigestion GU:no hematuria, no dysuria MS:no joint pain, no claudication Neuro:no syncope, no lightheadedness, + headache Endo:stable diabetes, no thyroid disease  PHYSICAL EXAM BP 130/72  Pulse 70  Ht 5' 3.5" (1.613 m)  Wt 166 lb 11.2 oz (75.615 kg)  BMI 29.06 kg/m2 General:Pleasant affect, NAD Skin:Warm and dry, brisk capillary refill HEENT:normocephalic, sclera clear, mucus membranes moist Neck:supple, no JVD, no bruits  Heart:S1S2 RRR with soft systolic murmur, no gallup, rub or  click Lungs:clear without rales, rhonchi, or wheezes WJX:BJYN, non tender, + BS, do not palpate liver spleen or masses Ext:no lower ext edema, 2+ pedal pulses, 2+ radial pulses Neuro:alert and oriented,  MAE, follows commands, + facial symmetry  EKG:SR rate of 70 without acute EKG changes.  ASSESSMENT AND PLAN PAF (paroxysmal atrial fibrillation) Episode of atrial fib today at cardiac rehab.  Now back in SR.  Pt was without acute symptoms, though she does admit to DOE.  This has increased, she has some bil neck pain episodically ( her angina was jaw pain only).  She is anticoagulated on coumadin.  She is on BB but very fatigued.  Previous Met Test with chronotropic incompetence with decrease in BB.  She had increased energy for awhile but now has had more fatigue.  Plan will be event monitor to eval atrial fib burden.  We will also have lexscan myoview done to eval for ischemia with neck pain.  She will then follow up with Dr. Herbie Baltimore for results and they will decide or anti arrhythmic vs ablation.      CAD, total RCA with collat, OM1 DES 07/06/2011 Her angina was only bilateral jaw and cheek pain.  Will eval. For ishcemia, with lexiscan myoview.  Chronotropic incompetence - partially medication related See above  Long term (current) use of anticoagulants On coumadin and therapeutic at last appt.

## 2012-12-17 NOTE — Assessment & Plan Note (Signed)
See above

## 2012-12-19 ENCOUNTER — Inpatient Hospital Stay (HOSPITAL_COMMUNITY)
Admission: EM | Admit: 2012-12-19 | Discharge: 2012-12-20 | DRG: 316 | Disposition: A | Discharge status: Home / Self Care | Payer: Medicare Other | Attending: Internal Medicine | Admitting: Internal Medicine

## 2012-12-19 ENCOUNTER — Emergency Department (HOSPITAL_COMMUNITY): Payer: Medicare Other

## 2012-12-19 ENCOUNTER — Telehealth: Payer: Self-pay | Admitting: Cardiology

## 2012-12-19 ENCOUNTER — Encounter (HOSPITAL_COMMUNITY): Payer: Self-pay | Admitting: Emergency Medicine

## 2012-12-19 DIAGNOSIS — Z836 Family history of other diseases of the respiratory system: Secondary | ICD-10-CM

## 2012-12-19 DIAGNOSIS — I4891 Unspecified atrial fibrillation: Secondary | ICD-10-CM | POA: Diagnosis present

## 2012-12-19 DIAGNOSIS — I498 Other specified cardiac arrhythmias: Secondary | ICD-10-CM

## 2012-12-19 DIAGNOSIS — H409 Unspecified glaucoma: Secondary | ICD-10-CM | POA: Diagnosis present

## 2012-12-19 DIAGNOSIS — I959 Hypotension, unspecified: Principal | ICD-10-CM | POA: Diagnosis present

## 2012-12-19 DIAGNOSIS — Z888 Allergy status to other drugs, medicaments and biological substances status: Secondary | ICD-10-CM

## 2012-12-19 DIAGNOSIS — E785 Hyperlipidemia, unspecified: Secondary | ICD-10-CM | POA: Diagnosis present

## 2012-12-19 DIAGNOSIS — N189 Chronic kidney disease, unspecified: Secondary | ICD-10-CM | POA: Diagnosis present

## 2012-12-19 DIAGNOSIS — Z882 Allergy status to sulfonamides status: Secondary | ICD-10-CM

## 2012-12-19 DIAGNOSIS — K219 Gastro-esophageal reflux disease without esophagitis: Secondary | ICD-10-CM | POA: Diagnosis present

## 2012-12-19 DIAGNOSIS — R001 Bradycardia, unspecified: Secondary | ICD-10-CM

## 2012-12-19 DIAGNOSIS — I4589 Other specified conduction disorders: Secondary | ICD-10-CM

## 2012-12-19 DIAGNOSIS — Z7901 Long term (current) use of anticoagulants: Secondary | ICD-10-CM

## 2012-12-19 DIAGNOSIS — R55 Syncope and collapse: Secondary | ICD-10-CM

## 2012-12-19 DIAGNOSIS — Z8249 Family history of ischemic heart disease and other diseases of the circulatory system: Secondary | ICD-10-CM

## 2012-12-19 DIAGNOSIS — Z87898 Personal history of other specified conditions: Secondary | ICD-10-CM

## 2012-12-19 DIAGNOSIS — I2582 Chronic total occlusion of coronary artery: Secondary | ICD-10-CM | POA: Diagnosis present

## 2012-12-19 DIAGNOSIS — E1142 Type 2 diabetes mellitus with diabetic polyneuropathy: Secondary | ICD-10-CM | POA: Diagnosis present

## 2012-12-19 DIAGNOSIS — I5189 Other ill-defined heart diseases: Secondary | ICD-10-CM | POA: Diagnosis present

## 2012-12-19 DIAGNOSIS — I480 Paroxysmal atrial fibrillation: Secondary | ICD-10-CM | POA: Diagnosis present

## 2012-12-19 DIAGNOSIS — Z79899 Other long term (current) drug therapy: Secondary | ICD-10-CM

## 2012-12-19 DIAGNOSIS — I495 Sick sinus syndrome: Secondary | ICD-10-CM | POA: Diagnosis present

## 2012-12-19 DIAGNOSIS — I2510 Atherosclerotic heart disease of native coronary artery without angina pectoris: Secondary | ICD-10-CM | POA: Diagnosis present

## 2012-12-19 DIAGNOSIS — E1149 Type 2 diabetes mellitus with other diabetic neurological complication: Secondary | ICD-10-CM | POA: Diagnosis present

## 2012-12-19 DIAGNOSIS — F411 Generalized anxiety disorder: Secondary | ICD-10-CM | POA: Diagnosis present

## 2012-12-19 DIAGNOSIS — I129 Hypertensive chronic kidney disease with stage 1 through stage 4 chronic kidney disease, or unspecified chronic kidney disease: Secondary | ICD-10-CM | POA: Diagnosis present

## 2012-12-19 DIAGNOSIS — Z9861 Coronary angioplasty status: Secondary | ICD-10-CM

## 2012-12-19 LAB — CBC WITH DIFFERENTIAL/PLATELET
Basophils Absolute: 0 10*3/uL (ref 0.0–0.1)
Basophils Relative: 1 % (ref 0–1)
Eosinophils Relative: 0 % (ref 0–5)
HCT: 36.4 % (ref 36.0–46.0)
Hemoglobin: 12 g/dL (ref 12.0–15.0)
Lymphocytes Relative: 15 % (ref 12–46)
Lymphs Abs: 1.2 10*3/uL (ref 0.7–4.0)
MCHC: 33 g/dL (ref 30.0–36.0)
Monocytes Relative: 7 % (ref 3–12)
Neutro Abs: 6.4 10*3/uL (ref 1.7–7.7)
Neutrophils Relative %: 77 % (ref 43–77)
RDW: 13.1 % (ref 11.5–15.5)
WBC: 8.3 10*3/uL (ref 4.0–10.5)

## 2012-12-19 LAB — POCT I-STAT TROPONIN I: Troponin i, poc: 0.01 ng/mL (ref 0.00–0.08)

## 2012-12-19 LAB — BASIC METABOLIC PANEL
CO2: 24 mEq/L (ref 19–32)
Calcium: 9.5 mg/dL (ref 8.4–10.5)
Chloride: 99 mmol/L (ref 97–108)
Chloride: 101 mEq/L (ref 96–112)
Creatinine, Ser: 1 mg/dL (ref 0.50–1.10)
GFR calc Af Amer: 53 mL/min/{1.73_m2} — ABNORMAL LOW (ref >59)
GFR calc Af Amer: 60 mL/min — ABNORMAL LOW (ref >90)
GFR calc non Af Amer: 46 mL/min/{1.73_m2} — ABNORMAL LOW (ref >59)
Glucose, Bld: 163 mg/dL — ABNORMAL HIGH (ref 70–99)
Glucose: 196 mg/dL — ABNORMAL HIGH (ref 65–99)
Potassium: 4.8 mmol/L (ref 3.5–5.2)
Potassium: 4.5 mEq/L (ref 3.5–5.1)
Sodium: 140 mmol/L (ref 134–144)
Sodium: 138 mEq/L (ref 135–145)

## 2012-12-19 LAB — TSH: TSH: 3.7 u[IU]/mL (ref 0.450–4.500)

## 2012-12-19 LAB — CBC
HCT: 34.6 % (ref 34.0–46.6)
MCH: 30.4 pg (ref 26.6–33.0)
MCHC: 33.8 g/dL (ref 31.5–35.7)
MCV: 90 fL (ref 79–97)
RDW: 13.2 % (ref 12.3–15.4)
WBC: 6.6 10*3/uL (ref 3.4–10.8)

## 2012-12-19 LAB — LIPID PANEL
Chol/HDL Ratio: 3.5 ratio units (ref 0.0–4.4)
LDL Calculated: 90 mg/dL (ref 0–99)
Triglycerides: 185 mg/dL — ABNORMAL HIGH (ref 0–149)

## 2012-12-19 LAB — HEMOGLOBIN A1C: Hgb A1c MFr Bld: 7.4 % — ABNORMAL HIGH (ref 4.8–5.6)

## 2012-12-19 LAB — TROPONIN I: Troponin I: <0.3 ng/mL (ref <0.30)

## 2012-12-19 MED ORDER — ATORVASTATIN CALCIUM 10 MG PO TABS
10.0000 mg | ORAL_TABLET | Freq: Every day | ORAL | Status: DC
  Filled 2012-12-19: qty 1

## 2012-12-19 MED ORDER — METFORMIN HCL 500 MG PO TABS
1000.0000 mg | ORAL_TABLET | Freq: Two times a day (BID) | ORAL | Status: DC
  Administered 2012-12-20: 1000 mg via ORAL
  Filled 2012-12-19 (×3): qty 2

## 2012-12-19 MED ORDER — GLIMEPIRIDE 2 MG PO TABS
2.0000 mg | ORAL_TABLET | Freq: Every day | ORAL | Status: DC
  Filled 2012-12-19 (×2): qty 1

## 2012-12-19 MED ORDER — MAGNESIUM 500 MG PO TABS: 500.0000 mg | ORAL_TABLET | Freq: Every day | ORAL | Status: DC

## 2012-12-19 MED ORDER — SODIUM CHLORIDE 0.9 % IJ SOLN
3.0000 mL | Freq: Two times a day (BID) | INTRAMUSCULAR | Status: DC
  Administered 2012-12-19 – 2012-12-20 (×2): 3 mL via INTRAVENOUS

## 2012-12-19 MED ORDER — MAGNESIUM GLUCONATE 500 MG PO TABS
500.0000 mg | ORAL_TABLET | Freq: Every day | ORAL | Status: DC
  Administered 2012-12-19: 500 mg via ORAL
  Filled 2012-12-19 (×2): qty 1

## 2012-12-19 MED ORDER — ALPRAZOLAM 0.25 MG PO TABS: 0.2500 mg | ORAL_TABLET | Freq: Every evening | ORAL | Status: DC | PRN

## 2012-12-19 MED ORDER — NITROGLYCERIN 0.4 MG SL SUBL: 0.4000 mg | SUBLINGUAL_TABLET | SUBLINGUAL | Status: DC | PRN

## 2012-12-19 NOTE — Telephone Encounter (Signed)
Agree with going to ER.  Marykay Lex, MD

## 2012-12-19 NOTE — H&P (Signed)
Chief Complaint: Syncope  HPI: The patient is a 79 y/o female followed by Dr. Herbie Baltimore. Her cardiac history is significant for PAF and CAD, s/p PCI + coronary stenting nearly a year ago. She was recently placed on a 30 day heart monitor to assses the burden of afib, after having a recent episodes of rapid afib.   She presented to the [facility] ER today after sustaining a brief syncopal episode while at the hair salon. The heart monitor read a HR in the 30s. EMS was called. When they arrived, she was also hypotensive, with SBP in the 70s. She is now in NSR. HR in the 70s. BP is stable w/ SBP in the 150s. She denies any further symptoms.   Past Medical History  Diagnosis Date  . Allergy   . Anxiety     no meds  . Cataract     reports she had cataracts removed.  . Glaucoma   . Diabetes mellitus     On oral medications  . GERD (gastroesophageal reflux disease)   . Hyperlipidemia   . Hypertension   . Chronic kidney disease     hx bladder infections  . Spinal stenosis   . Abnormal nuclear stress test 06/21/2011    Inferolateral reversible defect; he underwent cardiac catheterization and PCI of circumflex OM, occluded RCA with collaterals  . Unstable angina - history of 06/13/2011    Jaw pain awakening from sleep  . Coronary artery disease     s/P PCI to proximal OM1 w/ DES; occluded RCA with bridging and left to right collaterals  . Shortness of breath 11/2011    Evaluated with CPET - peak VO2 97%  . Arthritis   . Dyslipidemia   . Spinal stenosis   . PAF (paroxysmal atrial fibrillation)   . Chronotropic incompetence 11/2011    On CPET test; beta blockers reduced    Past Surgical History  Procedure Laterality Date  . Cataracts      both eyes  . Colonoscopy    . Vaginal hysterectomy    . Breast biopsy      both breast  . Cardiac catheterization  07/05/2011    Proximal OM1 lesion --> PCI; 100% mid RCA with right to right and left to right bridging collaterals from circumflex RPL  and LAD the PDA.  Marland Kitchen Coronary angioplasty with stent placement  07/05/2011    Promus Element DES 2.5 mm x 16 mm-post dilated to 2.65 mm CX-Proximal OM1    Family History  Problem Relation Age of Onset  . Colon cancer Neg Hx   . Esophageal cancer Neg Hx   . Stomach cancer Neg Hx   . COPD Mother   . Heart attack Father   . Heart disease Father   . Cancer Sister   . Cancer Sister    Social History:  reports that she has never smoked. She has never used smokeless tobacco. She reports that she does not drink alcohol or use illicit drugs.  Allergies:  Allergies  Allergen Reactions  . Lisinopril Cough  . Losartan Cough  . Sulfa Antibiotics Hives    Prior to Admission medications   Medication Sig Start Date End Date Taking? Authorizing Provider  ALPRAZolam Prudy Feeler) 0.25 MG tablet Take 0.25 mg by mouth at bedtime as needed for sleep or anxiety.    Yes Historical Provider, MD  amLODipine (NORVASC) 5 MG tablet Take 5 mg by mouth at bedtime.    Yes Historical Provider, MD  azelastine (ASTELIN) 137 MCG/SPRAY nasal spray Place 1 spray into both nostrils daily.  12/08/10  Yes Historical Provider, MD  cholecalciferol (VITAMIN D) 1000 UNITS tablet Take 1,000 Units by mouth daily.   Yes Historical Provider, MD  clopidogrel (PLAVIX) 75 MG tablet Take 75 mg by mouth daily.   Yes Historical Provider, MD  dexlansoprazole (DEXILANT) 60 MG capsule Take 60 mg by mouth daily.   Yes Historical Provider, MD  docusate sodium (COLACE) 100 MG capsule Take 100 mg by mouth at bedtime.    Yes Historical Provider, MD  fexofenadine (ALLEGRA) 180 MG tablet Take 180 mg by mouth daily.   Yes Historical Provider, MD  fish oil-omega-3 fatty acids 1000 MG capsule Take 1 g by mouth daily.    Yes Historical Provider, MD  glimepiride (AMARYL) 2 MG tablet Take 2 mg by mouth daily before breakfast.   Yes Historical Provider, MD  ipratropium (ATROVENT) 0.06 % nasal spray Place 1 spray into the nose at bedtime.  12/08/10  Yes  Historical Provider, MD  Magnesium 500 MG TABS Take 500 mg by mouth at bedtime.   Yes Historical Provider, MD  metFORMIN (GLUCOPHAGE) 1000 MG tablet Take 1,000 mg by mouth 2 (two) times daily with a meal.   Yes Historical Provider, MD  metoprolol tartrate (LOPRESSOR) 25 MG tablet Take 12.5 mg by mouth 2 (two) times daily.    Yes Historical Provider, MD  nitroGLYCERIN (NITROSTAT) 0.4 MG SL tablet Place 0.4 mg under the tongue every 5 (five) minutes as needed for chest pain.   Yes Historical Provider, MD  PARoxetine (PAXIL) 10 MG tablet Take 10 mg by mouth daily.  09/29/10  Yes Historical Provider, MD  rosuvastatin (CRESTOR) 20 MG tablet Take 10 mg by mouth at bedtime.    Yes Historical Provider, MD  warfarin (COUMADIN) 5 MG tablet Take 5-7.5 mg by mouth at bedtime. Take 1 1/2 tablets (7.5 mg) on Sunday, and take 1 tablet (5 mg) on all other days   Yes Historical Provider, MD     Results for orders placed during the hospital encounter of 12/19/12 (from the past 48 hour(s))  CBC WITH DIFFERENTIAL     Status: None   Collection Time    12/19/12  3:51 PM      Result Value Range   WBC 8.3  4.0 - 10.5 K/uL   RBC 3.93  3.87 - 5.11 MIL/uL   Hemoglobin 12.0  12.0 - 15.0 g/dL   HCT 16.1  09.6 - 04.5 %   MCV 92.6  78.0 - 100.0 fL   MCH 30.5  26.0 - 34.0 pg   MCHC 33.0  30.0 - 36.0 g/dL   RDW 40.9  81.1 - 91.4 %   Platelets 237  150 - 400 K/uL   Neutrophils Relative % 77  43 - 77 %   Neutro Abs 6.4  1.7 - 7.7 K/uL   Lymphocytes Relative 15  12 - 46 %   Lymphs Abs 1.2  0.7 - 4.0 K/uL   Monocytes Relative 7  3 - 12 %   Monocytes Absolute 0.6  0.1 - 1.0 K/uL   Eosinophils Relative 0  0 - 5 %   Eosinophils Absolute 0.0  0.0 - 0.7 K/uL   Basophils Relative 1  0 - 1 %   Basophils Absolute 0.0  0.0 - 0.1 K/uL  BASIC METABOLIC PANEL     Status: Abnormal   Collection Time    12/19/12  3:51 PM  Result Value Range   Sodium 138  135 - 145 mEq/L   Potassium 4.5  3.5 - 5.1 mEq/L   Chloride 101  96 -  112 mEq/L   CO2 24  19 - 32 mEq/L   Glucose, Bld 163 (*) 70 - 99 mg/dL   BUN 25 (*) 6 - 23 mg/dL   Creatinine, Ser 4.54  0.50 - 1.10 mg/dL   Calcium 9.5  8.4 - 09.8 mg/dL   GFR calc non Af Amer 51 (*) >90 mL/min   GFR calc Af Amer 60 (*) >90 mL/min   Comment: (NOTE)     The eGFR has been calculated using the CKD EPI equation.     This calculation has not been validated in all clinical situations.     eGFR's persistently <90 mL/min signify possible Chronic Kidney     Disease.  POCT I-STAT TROPONIN I     Status: None   Collection Time    12/19/12  4:39 PM      Result Value Range   Troponin i, poc 0.01  0.00 - 0.08 ng/mL   Comment 3            Comment: Due to the release kinetics of cTnI,     a negative result within the first hours     of the onset of symptoms does not rule out     myocardial infarction with certainty.     If myocardial infarction is still suspected,     repeat the test at appropriate intervals.   Dg Chest 2 View  12/19/2012   CLINICAL DATA:  Recent loss of consciousness  EXAM: CHEST  2 VIEW  COMPARISON:  09/02/2011  FINDINGS: The heart size and mediastinal contours are within normal limits. Both lungs are clear. The visualized skeletal structures are unremarkable.  IMPRESSION: No active cardiopulmonary disease.   Electronically Signed   By: Alcide Clever M.D.   On: 12/19/2012 16:41    Review of Systems  Constitutional: Positive for malaise/fatigue.  Respiratory: Negative for shortness of breath.   Cardiovascular: Negative for chest pain.  Musculoskeletal: Negative for falls.  Neurological: Positive for dizziness and loss of consciousness.  All other systems reviewed and are negative.    Blood pressure 135/57, pulse 69, temperature 97.8 F (36.6 C), temperature source Oral, resp. rate 15, height 5\' 3"  (1.6 m), weight 160 lb (72.576 kg), SpO2 100.00%. Physical Exam  Constitutional: She is oriented to person, place, and time. She appears well-developed and  well-nourished.  Cardiovascular: Normal rate, regular rhythm and intact distal pulses.  Exam reveals no gallop and no friction rub.   No murmur heard. Respiratory: Effort normal and breath sounds normal. No respiratory distress. She has no wheezes. She has no rales.  Musculoskeletal: She exhibits edema (trace bilateral edema).  Neurological: She is alert and oriented to person, place, and time. No cranial nerve deficit or sensory deficit. She exhibits normal muscle tone.  Skin: Skin is warm and dry.  Psychiatric: She has a normal mood and affect. Her behavior is normal.     Assessment/Plan Principal Problem:   Syncope Active Problems:   CAD, total RCA with collat, OM1 DES 07/06/2011   PAF (paroxysmal atrial fibrillation)   Long term (current) use of anticoagulants   Bradycardia   Hypotension  Plan:  79 y/o female with history of PAF and CAD, presents with syncope. Pt noted to be bradycardic on heart monitor and also hypotensive. She is now stable. HR in the  70s and SBP in the 150s. Will admit to observation. Plan for probable PPM insertion this admission. Will hold amlodipine, warfarin and plavix. MD to follow.   Tanny Harnack 12/19/2012, 6:25 PM

## 2012-12-19 NOTE — ED Notes (Signed)
Daughter reports while pt was in cardiac rehab on Tuesday and went into a.fib so that is why she was placed on the Holter monitor.

## 2012-12-19 NOTE — ED Provider Notes (Signed)
CSN: [id number]     Arrival date & time 12/19/12  1513 History   First MD Initiated Contact with Patient 12/19/12 1514     Chief Complaint  Patient presents with  . Loss of Consciousness    The history is provided by the patient. No language interpreter was used.   Abigail Boyd is an 79 year old Caucasian female with past medical history diabetes, hypertension, coronary disease status post stent placement a year ago who comes to the emergency department today with an episode of syncope. She has had syncope in the past and was diagnosed with atrial fibrillation several years ago she's been having episodes of tachycardia recently as a result her primary care physicians are from her Holter monitor on Tuesday. She was at a beauty appointment today when she was laid back and briefly lost consciousness for approximately 10 seconds. She denied any dizziness or weakness prior to the incident. She denies any vision changes, numbness, weakness, or headache. After the incident she had her blood pressure taken immediately, she stated that her blood pressure was 70/30 with a heart rate in the 50s. She did record the event on her Holter monitor. She did not fall.  She has no injuries. She denies any other complaints and has been acting normally since the incident.  Past Medical History  Diagnosis Date  . Allergy   . Anxiety     no meds  . Cataract     reports she had cataracts removed.  . Glaucoma   . Diabetes mellitus     On oral medications  . GERD (gastroesophageal reflux disease)   . Hyperlipidemia   . Hypertension   . Chronic kidney disease     hx bladder infections  . Spinal stenosis   . Abnormal nuclear stress test 06/21/2011    Inferolateral reversible defect; he underwent cardiac catheterization and PCI of circumflex OM, occluded RCA with collaterals  . Unstable angina - history of 06/13/2011    Jaw pain awakening from sleep  . Coronary artery disease     s/P PCI to proximal OM1 w/ DES;  occluded RCA with bridging and left to right collaterals  . Shortness of breath 11/2011    Evaluated with CPET - peak VO2 97%  . Arthritis   . Dyslipidemia   . Spinal stenosis   . PAF (paroxysmal atrial fibrillation)   . Chronotropic incompetence 11/2011    On CPET test; beta blockers reduced   Past Surgical History  Procedure Laterality Date  . Cataracts      both eyes  . Colonoscopy    . Vaginal hysterectomy    . Breast biopsy      both breast  . Cardiac catheterization  07/05/2011    Proximal OM1 lesion --> PCI; 100% mid RCA with right to right and left to right bridging collaterals from circumflex RPL and LAD the PDA.  Marland Kitchen Coronary angioplasty with stent placement  07/05/2011    Promus Element DES 2.5 mm x 16 mm-post dilated to 2.65 mm CX-Proximal OM1   Family History  Problem Relation Age of Onset  . Colon cancer Neg Hx   . Esophageal cancer Neg Hx   . Stomach cancer Neg Hx   . COPD Mother   . Heart attack Father   . Heart disease Father   . Cancer Sister   . Cancer Sister    History  Substance Use Topics  . Smoking status: Never Smoker   . Smokeless tobacco: Never Used  .  Alcohol Use: No   OB History   Grav Para Term Preterm Abortions TAB SAB Ect Mult Living                 Review of Systems  Constitutional: Negative for fever and chills.  Respiratory: Negative for cough and shortness of breath.   Gastrointestinal: Negative for nausea, vomiting, diarrhea and constipation.  Genitourinary: Negative for dysuria, urgency and frequency.  Skin: Negative for wound.  Neurological: Negative for dizziness, weakness, light-headedness, numbness and headaches.  All other systems reviewed and are negative.    Allergies  Lisinopril; Losartan; and Sulfa antibiotics  Home Medications   Current Outpatient Rx  Name  Route  Sig  Dispense  Refill  . ALPRAZolam (XANAX) 0.25 MG tablet   Oral   Take 0.25 mg by mouth at bedtime as needed for sleep or anxiety.          Marland Kitchen  amLODipine (NORVASC) 5 MG tablet   Oral   Take 5 mg by mouth at bedtime.          Marland Kitchen azelastine (ASTELIN) 137 MCG/SPRAY nasal spray   Each Nare   Place 1 spray into both nostrils daily.          . cholecalciferol (VITAMIN D) 1000 UNITS tablet   Oral   Take 1,000 Units by mouth daily.         . clopidogrel (PLAVIX) 75 MG tablet   Oral   Take 75 mg by mouth daily.         Marland Kitchen dexlansoprazole (DEXILANT) 60 MG capsule   Oral   Take 60 mg by mouth daily.         Marland Kitchen docusate sodium (COLACE) 100 MG capsule   Oral   Take 100 mg by mouth at bedtime.          . fexofenadine (ALLEGRA) 180 MG tablet   Oral   Take 180 mg by mouth daily.         . fish oil-omega-3 fatty acids 1000 MG capsule   Oral   Take 1 g by mouth daily.          Marland Kitchen glimepiride (AMARYL) 2 MG tablet   Oral   Take 2 mg by mouth daily before breakfast.         . ipratropium (ATROVENT) 0.06 % nasal spray   Nasal   Place 1 spray into the nose at bedtime.          . Magnesium 500 MG TABS   Oral   Take 500 mg by mouth at bedtime.         . metFORMIN (GLUCOPHAGE) 1000 MG tablet   Oral   Take 1,000 mg by mouth 2 (two) times daily with a meal.         . metoprolol tartrate (LOPRESSOR) 25 MG tablet   Oral   Take 12.5 mg by mouth 2 (two) times daily.          . nitroGLYCERIN (NITROSTAT) 0.4 MG SL tablet   Sublingual   Place 0.4 mg under the tongue every 5 (five) minutes as needed for chest pain.         Marland Kitchen PARoxetine (PAXIL) 10 MG tablet   Oral   Take 10 mg by mouth daily.          . rosuvastatin (CRESTOR) 20 MG tablet   Oral   Take 10 mg by mouth at bedtime.          Marland Kitchen  warfarin (COUMADIN) 5 MG tablet   Oral   Take 5-7.5 mg by mouth at bedtime. Take 1 1/2 tablets (7.5 mg) on Sunday, and take 1 tablet (5 mg) on all other days          BP 135/57  Pulse 69  Temp(Src) 97.8 F (36.6 C) (Oral)  Resp 15  Ht 5\' 3"  (1.6 m)  Wt 160 lb (72.576 kg)  BMI 28.35 kg/m2  SpO2  100% Physical Exam  Nursing note and vitals reviewed. Constitutional: She is oriented to person, place, and time. She appears well-developed and well-nourished. No distress.  HENT:  Head: Normocephalic and atraumatic.  Eyes: Pupils are equal, round, and reactive to light.  Neck: Normal range of motion.  Cardiovascular: Normal rate, regular rhythm, S1 normal and S2 normal.  Exam reveals gallop and S3.   No murmur heard. Pulmonary/Chest: Effort normal.  Abdominal: Soft. She exhibits no distension. There is no tenderness. There is no rebound and no guarding.  Neurological: She is alert and oriented to person, place, and time. She has normal strength. No cranial nerve deficit or sensory deficit. She exhibits normal muscle tone. Coordination and gait normal.  Skin: Skin is warm and dry. She is not diaphoretic. No erythema.    ED Course  Procedures (including critical care time) Labs Review Labs Reviewed  BASIC METABOLIC PANEL - Abnormal; Notable for the following:    Glucose, Bld 163 (*)    BUN 25 (*)    GFR calc non Af Amer 51 (*)    GFR calc Af Amer 60 (*)    All other components within normal limits  CBC WITH DIFFERENTIAL  POCT I-STAT TROPONIN I   Imaging Review Dg Chest 2 View  12/19/2012   CLINICAL DATA:  Recent loss of consciousness  EXAM: CHEST  2 VIEW  COMPARISON:  09/02/2011  FINDINGS: The heart size and mediastinal contours are within normal limits. Both lungs are clear. The visualized skeletal structures are unremarkable.  IMPRESSION: No active cardiopulmonary disease.   Electronically Signed   By: Alcide Clever M.D.   On: 12/19/2012 16:41    EKG Interpretation     Ventricular Rate:  61 PR Interval:  178 QRS Duration: 92 QT Interval:  405 QTC Calculation: 408 R Axis:   54 Text Interpretation:  Sinus rhythm Consider left atrial enlargement Abnormal R-wave progression, early transition            MDM   Abigail Boyd is an 79 year old Caucasian female with past  medical history of coronary artery disease in atrial fibrillation who comes emergency Department today with syncope. Physical exam as above. Abigail Boyd been wearing a Holter monitor for the past several days and she did record the event after she lost consciousness the Holter monitor company was contacted. They were unable to fax the incident over to the emergency department at this time however they were able to provide a read according to the Holter monitor company was a sinus arrhythmia with bradycardia with a rate in the 50s. Rate prior to the incident was in the mid 60s. Initial workup included a CBC BMP i-STAT troponin chest x-ray and EKG. EKG as read above. CBC was unremarkable. BMP was unremarkable. I-STAT troponin was 0.01. With symptomatic bradycardia this is concerning for syncope due to her bradycardia versus tachybradycardia syndrome. She was felt to require evaluation by cardiology. Cardiology was consulted.  They evaluated Abigail Boyd in emergency department and felt that she required admission to the hospital  for observation overnight. Abigail Boyd is admitted to the cardiology service in stable condition. Labs and imaging reviewed by myself and considered in medical decision making.  Imaging was interpreted by radiology. Care was discussed with my attending Dr. Gwendolyn Grant.  1. Symptomatic bradycardia   2. Atrial fibrillation   3. Syncope         Bethann Berkshire, MD 12/20/12 [phone]

## 2012-12-19 NOTE — Telephone Encounter (Signed)
x

## 2012-12-19 NOTE — H&P (Signed)
Pt. Seen and examined. Agree with the NP/PA-C note as written. Pleasant 79 yo female patient of Dr. Herbie Baltimore, who has a history of a-fib and tachy-brady syndrome with chronotropic incompetence. She has been having episodes of rapid a-fib recently, but she is unaware of it. She has been fatigued and was recently placed on a HR monitor in the office. Today at the beauty salon, she experienced a brief syncopal episode while getting her hair washed. On arrival by EMS, she was hypotensive with systolic BP in the 70's and bradycardic with HR in the 30's.  Currently, she is awake and talkative. HR is in the 70's and BP is 150/76.  Telemetry strips were obtained during the event which showed sinus bradycardia.  Earlier in the day, she was in atrial fibrillation with RVR in the 130's.  She has a CAD history, but denies chest pain. She had a stent about 1 year ago and was about to come off of plavix. She is chronically anticoagulated on warfarin.  Impression: 1.  Tachy-brady syndrome with symptomatic bradycardia 2.  Paroxysmal atrial fibrillation with RVR, despite b-blocker therapy 3.  Syncope  Plan: 1.  Based on the current events, I believe the only treatment solution for her at this point is a pacemaker. We could hold her b-blocker and amlodipine, however, she still is having a-fib with RVR despite b-blocker. She also had a vasodepressor response with marked bradycardia and hypotension. There is clearly sinus node dysfunction. We will plan to admit to observation. INR was 2.2 recently - hold overnight for possible pacer tomorrow. Keep NPO.  The only issue is that she is on Plavix - which may need to be washed out over the weekend. Will need to consult EP in the morning for evaluation.  Thanks for the consult.  Chrystie Nose, MD, [facility] Attending Cardiologist [facility]

## 2012-12-19 NOTE — ED Notes (Signed)
Pt assisted to the restroom, no issues.

## 2012-12-19 NOTE — Telephone Encounter (Signed)
Abigail Boyd, pt's daughter, called.  Stated pt is at the beauty salon and BP is 76/40 HR 56.  Wanted to know if she should take pt to ER.  RN asked if pt symptomatic.  Stated pt felt sweaty and feeling like she was going to pass out.  Advised pt lie down and elevate feet now.  Vernona Rieger, NP notified as pt seen two days ago.  Advised ER now as no changes made to pt's meds and unsure why sudden drop in BP.  Daughter informed and agreed w/ plan.  Daughter not with pt right now and advised she can call EMS to pt location to go to [facility] ER.  Daughter verbalized understanding.  Message forwarded to Dr. Herbie Baltimore [facility]).

## 2012-12-19 NOTE — ED Notes (Signed)
Per EMS - pt had a syncopal episode while she was at the hair salon getting her hair done, was unconscious for about 5 seconds. Upon ems arrival pt wad diaphoretic and nauseous, denies pain. EMS administered 4mg  IV Zofran. Pt had Holter monitor on, pt was placed on the monitor d/t an episode of tachycardia at her physicians office. EMS started a 20 G in right hand. BP 132/76 HR 60s. CBG 150, pt reports she normally runs there, pt is type 2 diabetic. Pt has been a&Ox4 the entire time with EMS.

## 2012-12-19 NOTE — Telephone Encounter (Signed)
New onset Afib at 135 bpm.  Asymptomatic.  Unable to give duration as pt still in Afib.  Will fax report.  Message forwarded to Dr. Herbie Baltimore.  Will place fax on cart when received.

## 2012-12-19 NOTE — ED Notes (Signed)
Pt given water and crackers  

## 2012-12-19 NOTE — ED Notes (Signed)
Cardiology at bedside.

## 2012-12-19 NOTE — Telephone Encounter (Signed)
Stated pt reported fainting on monitor and they have not been able to get in contact w/ pt.  Stated pt was in sinus brady at time.  Will fax report.  Informed call was received and pt may take monitor off r/t ER visit.  Verbalized understanding.  Will give report to Dr. Herbie Baltimore when received.

## 2012-12-20 DIAGNOSIS — I495 Sick sinus syndrome: Secondary | ICD-10-CM

## 2012-12-20 LAB — BASIC METABOLIC PANEL
CO2: 25 mEq/L (ref 19–32)
Calcium: 9.3 mg/dL (ref 8.4–10.5)
GFR calc non Af Amer: 53 mL/min — ABNORMAL LOW (ref >90)
Glucose, Bld: 163 mg/dL — ABNORMAL HIGH (ref 70–99)
Potassium: 4.7 mEq/L (ref 3.5–5.1)
Sodium: 140 mEq/L (ref 135–145)

## 2012-12-20 LAB — CBC
MCHC: 33.7 g/dL (ref 30.0–36.0)
MCV: 92 fL (ref 78.0–100.0)
Platelets: 228 10*3/uL (ref 150–400)
RBC: 3.51 MIL/uL — ABNORMAL LOW (ref 3.87–5.11)
WBC: 6.8 10*3/uL (ref 4.0–10.5)

## 2012-12-20 LAB — GLUCOSE, CAPILLARY: Glucose-Capillary: 156 mg/dL — ABNORMAL HIGH (ref 70–99)

## 2012-12-20 NOTE — Progress Notes (Signed)
    Subjective: Complaining of a dry mouth. She denies any dizziness or further syncope/near syncope.   Objective: Vital signs in last 24 hours: Temp:  [97.5 F (36.4 C)-98.4 F (36.9 C)] 98.4 F (36.9 C) (10/24 0900) Pulse Rate:  [62-73] 69 (10/24 0900) Resp:  [12-23] 13 (10/24 0900) BP: (101-146)/(40-74) 114/57 mmHg (10/24 0900) SpO2:  [94 %-100 %] 96 % (10/24 0900) Weight:  [160 lb (72.576 kg)-164 lb 7.4 oz (74.6 kg)] 164 lb 7.4 oz (74.6 kg) (10/23 2215) Last BM Date: 12/19/12  Intake/Output from previous day:   Intake/Output this shift:    Medications Current Facility-Administered Medications  Medication Dose Route Frequency Provider Last Rate Last Dose  . ALPRAZolam (XANAX) tablet 0.25 mg  0.25 mg Oral QHS PRN Latecia Miler, PA-C      . atorvastatin (LIPITOR) tablet 10 mg  10 mg Oral q1800 Tien Spooner, PA-C      . glimepiride (AMARYL) tablet 2 mg  2 mg Oral QAC breakfast Raymir Frommelt, PA-C      . magnesium gluconate (MAGONATE) tablet 500 mg  500 mg Oral QHS Chrystie Nose, MD   500 mg at 12/19/12 2317  . metFORMIN (GLUCOPHAGE) tablet 1,000 mg  1,000 mg Oral BID WC Jacarius Handel, PA-C      . nitroGLYCERIN (NITROSTAT) SL tablet 0.4 mg  0.4 mg Sublingual Q5 min PRN Ryanne Morand, PA-C      . sodium chloride 0.9 % injection 3 mL  3 mL Intravenous Q12H Laconya Clere, PA-C   3 mL at 12/19/12 2318    PE: General appearance: alert, cooperative and no distress Lungs: clear to auscultation bilaterally Heart: regular rate and rhythm Extremities: no LEE Pulses: 2+ and symmetric Skin: warm and dry Neurologic: Grossly normal  Lab Results:   Recent Labs  12/18/12 0941 12/19/12 1551 12/20/12 0555  WBC 6.6 8.3 6.8  HGB 11.7 12.0 10.9*  HCT 34.6 36.4 32.3*  PLT 279 237 228   BMET  Recent Labs  12/18/12 0941 12/19/12 1551 12/20/12 0555  NA 140 138 140  K 4.8 4.5 4.7  CL 99 101 104  CO2 24 24 25   GLUCOSE 196* 163* 163*  BUN 26 25* 20    CREATININE 1.12* 1.00 0.97  CALCIUM 9.8 9.5 9.3      Assessment/Plan    Principal Problem:   Syncope Active Problems:   CAD, total RCA with collat, OM1 DES 07/06/2011   PAF (paroxysmal atrial fibrillation)   Long term (current) use of anticoagulants   Bradycardia   Hypotension   Tachy-brady syndrome  Plan: No events overnight. Maintainig NSR on telemetry. HR in the 70s. No further bradycardia or tachycardia. BP stable. I have placed an EP consult for evaluation of PPM. Plan to keep NPO for now, until seen by EP. MD to follow.      LOS: 1 day    Neddie Steedman M. Sharol Harness, PA-C 12/20/2012 10:00 AM

## 2012-12-20 NOTE — Discharge Summary (Signed)
Physician Discharge Summary  Patient ID: Abigail Boyd MRN: [id number] DOB/AGE: 77-17-1931 77 y.o.  Admit date: 12/19/2012 Discharge date: 12/20/2012  Admission Diagnoses: Syncope  Discharge Diagnoses:  Principal Problem:   Syncope Active Problems:   CAD, total RCA with collat, OM1 DES 07/06/2011   PAF (paroxysmal atrial fibrillation)   Long term (current) use of anticoagulants   Bradycardia   Hypotension   Tachy-brady syndrome   Discharged Condition: stable  Hospital Course: the patient is a 79 y/o female patient of Dr. Herbie Baltimore, who has a history of a-fib and tachy-brady syndrome with chronotropic incompetence. She has been having episodes of rapid a-fib recently, but she is unaware of it. She has been fatigued and was recently placed on a HR monitor in the office. She presented to the [facility] ER on 12/19/12 after she experienced a brief syncopal episode while getting her hair washed at a beauty salaon. On arrival by EMS, she was hypotensive with systolic BP in the 70's and bradycardic with HR in the 30's. By the time she arrived to the ER, she was stable. Her HR improved to the 70s and BP was 150/76. Telemetry strips were obtained during the event which showed sinus bradycardia. Earlier in the day, she was in atrial fibrillation with RVR in the 130's. She also has a CAD history, but denied any chest pain. She had a stent about 1 year ago and was about to come off of plavix. She is also chronically anticoagulated on warfarin. It was decided to admit for observation. Both her BB and CCB were held, as well as warfarin and plavix, in anticipation of possible PPM insertion. She was placed on telemetry. She remained stable overnight and had no further bradycardia,tachycardia. She was seen by electrophysiology for consideration for a PPM. She was seen by Dr. Graciela Husbands. He felt that her syncope was purely neurocardiogenic and that a pacer was not warranted. This was discussed with Dr. Rennis Golden, and it was  decided to discharge home and have her continue with her event monitor. She was last seen by Dr. Rennis Golden, who agreed that she was stable. He recommended that she continue with her BB. Her CCB however was discontinued. Her Plavix was discontinued as well, as it has been over 1 year since her stent was placed. The patient will follow-up in clinic with Dr. Herbie Baltimore.     Consults: electrophysiology  Significant Diagnostic Studies: None  Treatments: See Hospital Course  Discharge Exam: Blood pressure 159/59, pulse 67, temperature 98.2 F (36.8 C), temperature source Oral, resp. rate 18, height 5\' 3"  (1.6 m), weight 164 lb 7.4 oz (74.6 kg), SpO2 97.00%.   Disposition: 01-Home or Self Care      Discharge Orders   Future Appointments Provider Department Dept Phone   12/25/2012 1:30 PM [facility] [place] [facility] [address] [phone]   01/01/2013 1:30 PM Phillips Hay, RPH-CPP [facility] [phone]   01/28/2013 4:15 PM Marykay Lex, MD [facility] [phone]   02/03/2013 4:00 PM Marykay Lex, MD [facility] [phone]   Future Orders Complete By Expires   Diet - low sodium heart healthy  As directed    Increase activity slowly  As directed        Medication List    STOP taking these medications       amLODipine 5 MG tablet  Commonly known as:  NORVASC     clopidogrel 75 MG tablet  Commonly known as:  PLAVIX  TAKE these medications       ALPRAZolam 0.25 MG tablet  Commonly known as:  XANAX  Take 0.25 mg by mouth at bedtime as needed for sleep or anxiety.     azelastine 137 MCG/SPRAY nasal spray  Commonly known as:  ASTELIN  Place 1 spray into both nostrils daily.     cholecalciferol 1000 UNITS tablet  Commonly known as:  VITAMIN D  Take 1,000 Units by mouth daily.     dexlansoprazole 60 MG capsule  Commonly known as:  DEXILANT  Take 60 mg by mouth daily.     docusate sodium 100 MG  capsule  Commonly known as:  COLACE  Take 100 mg by mouth at bedtime.     fexofenadine 180 MG tablet  Commonly known as:  ALLEGRA  Take 180 mg by mouth daily.     fish oil-omega-3 fatty acids 1000 MG capsule  Take 1 g by mouth daily.     glimepiride 2 MG tablet  Commonly known as:  AMARYL  Take 2 mg by mouth daily before breakfast.     ipratropium 0.06 % nasal spray  Commonly known as:  ATROVENT  Place 1 spray into the nose at bedtime.     Magnesium 500 MG Tabs  Take 500 mg by mouth at bedtime.     metFORMIN 1000 MG tablet  Commonly known as:  GLUCOPHAGE  Take 1,000 mg by mouth 2 (two) times daily with a meal.     metoprolol tartrate 25 MG tablet  Commonly known as:  LOPRESSOR  Take 12.5 mg by mouth 2 (two) times daily.     nitroGLYCERIN 0.4 MG SL tablet  Commonly known as:  NITROSTAT  Place 0.4 mg under the tongue every 5 (five) minutes as needed for chest pain.     PARoxetine 10 MG tablet  Commonly known as:  PAXIL  Take 10 mg by mouth daily.     rosuvastatin 20 MG tablet  Commonly known as:  CRESTOR  Take 10 mg by mouth at bedtime.     warfarin 5 MG tablet  Commonly known as:  COUMADIN  Take 5-7.5 mg by mouth at bedtime. Take 1 1/2 tablets (7.5 mg) on Sunday, and take 1 tablet (5 mg) on all other days       Follow-up Information   Follow up with Marykay Lex, MD. (our office will call you on Monday for an appoitment with Dr. Herbie Baltimore)    Specialty:  Cardiology   Contact information:   [address] [place] [address] [phone]      TIME SPENT ON DISCHARGE, INCLUDING PHYSICIAN TIME: >30 MINUTES  Signed: Allayne Butcher, PA-C 12/20/2012, 5:39 PM

## 2012-12-20 NOTE — Progress Notes (Signed)
Pt. Seen and examined. Agree with the NP/PA-C note as written.  Discussed with Dr. Graciela Husbands, he feels this is purely neurocardiogenic syncope and that a pacer is not warranted for this, however, she does still have tachy-brady features. Plan to continue her monitor. Restart b-blocker but d/c amlodipine to allow higher BP.  Also okay to d/c plavix as it has been >1 year since her stent. Follow-up with Dr. Herbie Baltimore. She is to have a stress test - unfortunately, she cannot walk on the treadmill, but it would be good to test her chronotropic response to exercise.  Chrystie Nose, MD, [facility] Attending Cardiologist [facility]

## 2012-12-20 NOTE — ED Provider Notes (Signed)
I saw and evaluated the patient, reviewed the resident's note and I agree with the findings and plan.  EKG Interpretation     Ventricular Rate:  61 PR Interval:  178 QRS Duration: 92 QT Interval:  405 QTC Calculation: 408 R Axis:   54 Text Interpretation:  Sinus rhythm Consider left atrial enlargement Abnormal R-wave progression, early transition            79 year old female here with syncope. No history of syncope, wearing a Holter monitor today. Had 1 minute loss of consciousness. No preceding symptoms. Hears feeling well except for some mild fatigue. Denies any chest pain, shortness of breath and dizziness. Labs are normal. Holter monitor company contacted as showing sinus bradycardia at the time of her syncope. She did have some vitals with EMS they were initially low at 70/30, however that improved. Concern for some symptomatically bradycardia. Patient's cardiologist is admitting for observation  Dagmar Hait, MD 12/20/12 [phone]

## 2012-12-20 NOTE — Consult Note (Signed)
ELECTROPHYSIOLOGY CONSULT NOTE    Patient ID: Abigail Boyd MRN: [id number], DOB/AGE: [birth year] 79 y.o.  Admit date: 12/19/2012 Date of Consult: 12-20-2012  Primary Physician: Raliegh Ip, MD Primary Cardiologist: Bryan Lemma, MD  Reason for Consultation: syncope  HPI:  Abigail Boyd is a 79 year old female with a past medical history significant for coronary artery disease (PCI in May 2013), PAF (diagnosed about a year ago per patient), hypertension, diabetes, and hyperlipidemia. She has been participating in cardiac rehab.  She was recently found to be in recurrent atrial fibrillation while at cardiac rehab. She was then seen at the [facility] office and an event monitor was placed to evaluate afib burden.  Yesterday, while at the beauty salon, she was sitting in the chair, developed a feeling that "something wasn't right" associated with profuse sweating and dizziness.  There was a sense of warmth and modest nausea.  She states that she passed out.  The hairdresser took her blood pressure and found it to be 70 or so systolic; heart rate was about 50.      EMS was called and initial vital signs were apparently normal. This has been confirmed.   and she was brought to [facility] for further evaluation.    I spoke with the event monitor company today who stated that she had no pauses, atrial fibrillation, or other tachy arrhythmias at that time.  She was sinus brady in the 50's. Prior records suggest a heart rate in 30s is not confirmed  She states she has had one other syncopal spell about a year ago at [facility].  She was sitting and being interviewed prior to an appointment.  She developed a similar prodrome and had a brief loss of consciousness.  She was admitted and the diagnosis of atrial fibrillation was made at that time.   She has been on beta blockers with a reduction in dose in 2013 due to a CPX that was done demonstrating chronotropic incompetence.  She has no functional  limitations with her daily activities.  She denies chest pain, shortness of breath, or palpitations.  She does have occasional dizzy spells which she is unable to quantify today and also a non productive cough.   EP has been asked to evaluate for treatment options.    Last echo 11-2011 demonstrated an EF of 55% with mild to moderate MR.   ROS is negative except as outlined above.   Past Medical History  Diagnosis Date  . Allergy   . Anxiety     no meds  . Cataract     reports she had cataracts removed.  . Glaucoma   . Diabetes mellitus     On oral medications  . GERD (gastroesophageal reflux disease)   . Hyperlipidemia   . Hypertension   . Chronic kidney disease     hx bladder infections  . Spinal stenosis   . Abnormal nuclear stress test 06/21/2011    Inferolateral reversible defect; he underwent cardiac catheterization and PCI of circumflex OM, occluded RCA with collaterals  . Unstable angina - history of 06/13/2011    Jaw pain awakening from sleep  . Coronary artery disease     s/P PCI to proximal OM1 w/ DES; occluded RCA with bridging and left to right collaterals  . Shortness of breath 11/2011    Evaluated with CPET - peak VO2 97%  . Arthritis   . Dyslipidemia   . Spinal stenosis   . PAF (paroxysmal atrial fibrillation)   .  Chronotropic incompetence 11/2011    On CPET test; beta blockers reduced     Surgical History:  Past Surgical History  Procedure Laterality Date  . Cataracts      both eyes  . Colonoscopy    . Vaginal hysterectomy    . Breast biopsy      both breast  . Cardiac catheterization  07/05/2011    Proximal OM1 lesion --> PCI; 100% mid RCA with right to right and left to right bridging collaterals from circumflex RPL and LAD the PDA.  Marland Kitchen Coronary angioplasty with stent placement  07/05/2011    Promus Element DES 2.5 mm x 16 mm-post dilated to 2.65 mm CX-Proximal OM1     Prescriptions prior to admission  Medication Sig Dispense Refill  .  ALPRAZolam (XANAX) 0.25 MG tablet Take 0.25 mg by mouth at bedtime as needed for sleep or anxiety.       Marland Kitchen amLODipine (NORVASC) 5 MG tablet Take 5 mg by mouth at bedtime.       Marland Kitchen azelastine (ASTELIN) 137 MCG/SPRAY nasal spray Place 1 spray into both nostrils daily.       . cholecalciferol (VITAMIN D) 1000 UNITS tablet Take 1,000 Units by mouth daily.      . clopidogrel (PLAVIX) 75 MG tablet Take 75 mg by mouth daily.      Marland Kitchen dexlansoprazole (DEXILANT) 60 MG capsule Take 60 mg by mouth daily.      Marland Kitchen docusate sodium (COLACE) 100 MG capsule Take 100 mg by mouth at bedtime.       . fexofenadine (ALLEGRA) 180 MG tablet Take 180 mg by mouth daily.      . fish oil-omega-3 fatty acids 1000 MG capsule Take 1 g by mouth daily.       Marland Kitchen glimepiride (AMARYL) 2 MG tablet Take 2 mg by mouth daily before breakfast.      . ipratropium (ATROVENT) 0.06 % nasal spray Place 1 spray into the nose at bedtime.       . Magnesium 500 MG TABS Take 500 mg by mouth at bedtime.      . metFORMIN (GLUCOPHAGE) 1000 MG tablet Take 1,000 mg by mouth 2 (two) times daily with a meal.      . metoprolol tartrate (LOPRESSOR) 25 MG tablet Take 12.5 mg by mouth 2 (two) times daily.       . nitroGLYCERIN (NITROSTAT) 0.4 MG SL tablet Place 0.4 mg under the tongue every 5 (five) minutes as needed for chest pain.      Marland Kitchen PARoxetine (PAXIL) 10 MG tablet Take 10 mg by mouth daily.       . rosuvastatin (CRESTOR) 20 MG tablet Take 10 mg by mouth at bedtime.       Marland Kitchen warfarin (COUMADIN) 5 MG tablet Take 5-7.5 mg by mouth at bedtime. Take 1 1/2 tablets (7.5 mg) on Sunday, and take 1 tablet (5 mg) on all other days        Inpatient Medications:  . atorvastatin  10 mg Oral q1800  . glimepiride  2 mg Oral QAC breakfast  . magnesium gluconate  500 mg Oral QHS  . metFORMIN  1,000 mg Oral BID WC  . sodium chloride  3 mL Intravenous Q12H    Allergies:  Allergies  Allergen Reactions  . Lisinopril Cough  . Losartan Cough  . Sulfa Antibiotics  Hives    History   Social History  . Marital Status: Widowed    Spouse Name: N/A  Number of Children: N/A  . Years of Education: N/A   Occupational History  . Not on file.   Social History Main Topics  . Smoking status: Never Smoker   . Smokeless tobacco: Never Used  . Alcohol Use: No  . Drug Use: No  . Sexual Activity: No   Other Topics Concern  . Not on file   Social History Narrative   Widowed mother of one, grandmother 2. Has not been using silver take as much lately due to knee discomfort. Usually exercises with Silver Sneakers on regular basis.     Family History  Problem Relation Age of Onset  . Colon cancer Neg Hx   . Esophageal cancer Neg Hx   . Stomach cancer Neg Hx   . COPD Mother   . Heart attack Father   . Heart disease Father   . Cancer Sister   . Cancer Sister     BP 159/59  Pulse 67  Temp(Src) 98.2 F (36.8 C) (Oral)  Resp 18  Ht 5\' 3"  (1.6 m)  Wt 164 lb 7.4 oz (74.6 kg)  BMI 29.14 kg/m2  SpO2 97% Well developed and nourished in no acute distress HENT normal Neck supple with JVP-flat Clear Regular rate and rhythm, no murmurs or gallops Abd-soft with active BS No Clubbing cyanosis edema Skin-warm and dry A & Oriented  Grossly normal sensory and motor function  Carotid massage neg  Labs:   Lab Results  Component Value Date   WBC 6.8 12/20/2012   HGB 10.9* 12/20/2012   HCT 32.3* 12/20/2012   MCV 92.0 12/20/2012   PLT 228 12/20/2012    Recent Labs Lab 12/20/12 0555  NA 140  K 4.7  CL 104  CO2 25  BUN 20  CREATININE 0.97  CALCIUM 9.3  GLUCOSE 163*   Lab Results  Component Value Date   TROPONINI <0.30 12/19/2012   No results found for this basename: CHOL   Lab Results  Component Value Date   HDL 50 12/18/2012   Lab Results  Component Value Date   LDLCALC 90 12/18/2012   Lab Results  Component Value Date   TRIG 185* 12/18/2012   Lab Results  Component Value Date   CHOLHDL 3.5 12/18/2012       Radiology/Studies: Dg Chest 2 View 12/19/2012   CLINICAL DATA:  Recent loss of consciousness  EXAM: CHEST  2 VIEW  COMPARISON:  09/02/2011  FINDINGS: The heart size and mediastinal contours are within normal limits. Both lungs are clear. The visualized skeletal structures are unremarkable.  IMPRESSION: No active cardiopulmonary disease.   Electronically Signed   By: Alcide Clever M.D.   On: 12/19/2012 16:41   EKG: sinus rhythm, rate 61, normal intervals  TELEMETRY: sinus rhythm, no arrhythmias  Principal Problem:   Syncope Active Problems:   CAD, total RCA with collat, OM1 DES 07/06/2011   PAF (paroxysmal atrial fibrillation)   Long term (current) use of anticoagulants   Bradycardia   Hypotension   Tachy-brady syndrome  The patient had syncope with epiphenomena consistent with a neurally mediated episode. There were no significant bradycardia arrhythmias associated with the event monitor. Heart rate of 30 described in prior notes apparently was in her. This is confirmed with the event recorder company.  The specific is not clear. She does have diabetes and likely some diabetic neuropathy. She was at the hairdresser but this is a weekly event was hard to know what might have been deferred.  Syncopal episode and  mild bradycardia accompanied it is not an indication for pacing.  The tachybradycardia issues need further clarification. Event recorder was demonstrated atrial fibrillation rates in the 130s. She has a history of chronotropic incompetence. As she needs augmented rate control heart rate response become more problematic and at that juncture pacing might be appropriate.  For now, I would consider discharge with ongoing event recording and resumption titration of rate controlling medications.  She needs to be advised not to drive

## 2012-12-25 ENCOUNTER — Ambulatory Visit (HOSPITAL_COMMUNITY)
Admission: RE | Admit: 2012-12-25 | Discharge: 2012-12-25 | Disposition: A | Discharge status: Home / Self Care | Payer: Medicare Other | Source: Ambulatory Visit | Attending: Cardiovascular Disease | Admitting: Cardiovascular Disease

## 2012-12-25 DIAGNOSIS — I10 Essential (primary) hypertension: Secondary | ICD-10-CM | POA: Insufficient documentation

## 2012-12-25 DIAGNOSIS — R0602 Shortness of breath: Secondary | ICD-10-CM | POA: Insufficient documentation

## 2012-12-25 DIAGNOSIS — I4891 Unspecified atrial fibrillation: Secondary | ICD-10-CM

## 2012-12-25 DIAGNOSIS — R42 Dizziness and giddiness: Secondary | ICD-10-CM | POA: Insufficient documentation

## 2012-12-25 DIAGNOSIS — R9439 Abnormal result of other cardiovascular function study: Secondary | ICD-10-CM | POA: Insufficient documentation

## 2012-12-25 DIAGNOSIS — R079 Chest pain, unspecified: Secondary | ICD-10-CM | POA: Insufficient documentation

## 2012-12-25 DIAGNOSIS — R5381 Other malaise: Secondary | ICD-10-CM | POA: Insufficient documentation

## 2012-12-25 DIAGNOSIS — R0609 Other forms of dyspnea: Secondary | ICD-10-CM | POA: Insufficient documentation

## 2012-12-25 DIAGNOSIS — R55 Syncope and collapse: Secondary | ICD-10-CM | POA: Insufficient documentation

## 2012-12-25 DIAGNOSIS — E119 Type 2 diabetes mellitus without complications: Secondary | ICD-10-CM | POA: Insufficient documentation

## 2012-12-25 DIAGNOSIS — R0989 Other specified symptoms and signs involving the circulatory and respiratory systems: Secondary | ICD-10-CM | POA: Insufficient documentation

## 2012-12-25 DIAGNOSIS — I2510 Atherosclerotic heart disease of native coronary artery without angina pectoris: Secondary | ICD-10-CM

## 2012-12-25 DIAGNOSIS — E663 Overweight: Secondary | ICD-10-CM | POA: Insufficient documentation

## 2012-12-25 DIAGNOSIS — M542 Cervicalgia: Secondary | ICD-10-CM

## 2012-12-25 DIAGNOSIS — I480 Paroxysmal atrial fibrillation: Secondary | ICD-10-CM

## 2012-12-25 MED ORDER — AMINOPHYLLINE 25 MG/ML IV SOLN
75.0000 mg | Freq: Once | INTRAVENOUS | Status: AC
  Administered 2012-12-25: 75 mg via INTRAVENOUS

## 2012-12-25 MED ORDER — TECHNETIUM TC 99M SESTAMIBI GENERIC - CARDIOLITE
10.5000 | Freq: Once | INTRAVENOUS | Status: AC | PRN
  Administered 2012-12-25: 11 via INTRAVENOUS

## 2012-12-25 MED ORDER — REGADENOSON 0.4 MG/5ML IV SOLN
0.4000 mg | Freq: Once | INTRAVENOUS | Status: AC
  Administered 2012-12-25: 0.4 mg via INTRAVENOUS

## 2012-12-25 MED ORDER — TECHNETIUM TC 99M SESTAMIBI GENERIC - CARDIOLITE
31.4000 | Freq: Once | INTRAVENOUS | Status: AC | PRN
  Administered 2012-12-25: 31 via INTRAVENOUS

## 2012-12-25 NOTE — Procedures (Addendum)
[facility] [address] [place] [address] [place] [address] [phone]  Cardiology Nuclear Med Study  Abigail Boyd is a 79 y.o. female     MRN : [id number]     DOB: February 02, 1930  Procedure Date: 12/25/2012  Nuclear Med Background Indication for Stress Test:  Evaluation for Ischemia and Post Hospital History:  CAD;MI--10/05/2011;STENT/PTCA--07/05/2011;PAF Cardiac Risk Factors: Hypertension, Lipids, NIDDM and Overweight  Symptoms:  Chest Pain, DOE, Fatigue, Light-Headedness, SOB and Syncope   Nuclear Pre-Procedure Caffeine/Decaff Intake:  1:00am NPO After: 11AM   IV Site: R Antecubital  IV 0.9% NS with Angio Cath:  22g  Chest Size (in):  N/A IV Started by: Emmit Pomfret, RN  Height: 5\' 3"  (1.6 m)  Cup Size: C  BMI:  Body mass index is 29.41 kg/(m^2). Weight:  166 lb (75.297 kg)   Tech Comments:  N/A    Nuclear Med Study 1 or 2 day study: 1 day  Stress Test Type:  Lexiscan  Order Authorizing Provider:  Bryan Lemma, MD   Resting Radionuclide: Technetium 35m Sestamibi  Resting Radionuclide Dose: 10.5 mCi   Stress Radionuclide:  Technetium 36m Sestamibi  Stress Radionuclide Dose: 31.4 mCi           Stress Protocol Rest HR: 60 Stress HR: 86  Rest BP:145/76 Stress BP: 156/64  Exercise Time (min): n/a METS: n/a          Dose of Adenosine (mg):  n/a Dose of Lexiscan: 0.4 mg  Dose of Atropine (mg): n/a Dose of Dobutamine: n/a mcg/kg/min (at max HR)  Stress Test Technologist: Ernestene Mention, CCT Nuclear Technologist: Gonzella Lex, CNMT   Rest Procedure:  Myocardial perfusion imaging was performed at rest 45 minutes following the intravenous administration of Technetium 20m Sestamibi. Stress Procedure:  The patient received IV Lexiscan 0.4 mg over 15-seconds.  Technetium 12m Sestamibi injected at 30-seconds.  Due to patient's dizziness and shortness of breath, she was given IV Aminophylline 75 mg. Symptoms were resolved during  recovery. There were no significant changes with Lexiscan.  Quantitative spect images were obtained after a 45 minute delay.  Transient Ischemic Dilatation (Normal <1.22):  1.16 Lung/Heart Ratio (Normal <0.45):  0.30 QGS EDV:  57 ml QGS ESV:  14 ml LV Ejection Fraction: 75%  Signed by  Rest ECG: NSR - Normal EKG  Stress ECG: No significant change from baseline ECG  QPS Raw Data Images:  Normal; no motion artifact; normal heart/lung ratio. Stress Images:  There is decreased uptake in the inferior wall. Rest Images:  There is decreased uptake in the inferior wall. Subtraction (SDS):  No evidence of ischemia.  Impression Exercise Capacity:  Lexiscan with no exercise. BP Response:  Normal blood pressure response. Clinical Symptoms:  Dizzy ECG Impression:  No significant ECG changes with Lexiscan. Comparison with Prior Nuclear Study: No previous nuclear study performed  Overall Impression:  Low risk stress nuclear study with fixed basal inferior bowel artifact - underlying hot bowel..  LV Wall Motion:  NL LV Function; NL Wall Motion; EF 75%  Chrystie Nose, MD, [facility] Board Certified in Nuclear Cardiology Attending Cardiologist [facility]  Chrystie Nose, MD  12/26/2012 8:35 AM

## 2012-12-26 NOTE — Progress Notes (Signed)
Pt. Informed of test results and stated she was feeling much better since being discharged.

## 2013-01-01 ENCOUNTER — Ambulatory Visit (INDEPENDENT_AMBULATORY_CARE_PROVIDER_SITE_OTHER): Payer: Medicare Other | Admitting: Cardiology

## 2013-01-01 ENCOUNTER — Ambulatory Visit (INDEPENDENT_AMBULATORY_CARE_PROVIDER_SITE_OTHER): Payer: Medicare Other | Admitting: Pharmacist Clinician (PhC)/ Clinical Pharmacy Specialist

## 2013-01-01 ENCOUNTER — Encounter: Payer: Self-pay | Admitting: Cardiology

## 2013-01-01 VITALS — BP 120/60 | HR 69 | Ht 63.0 in | Wt 162.7 lb

## 2013-01-01 DIAGNOSIS — Z7901 Long term (current) use of anticoagulants: Secondary | ICD-10-CM

## 2013-01-01 DIAGNOSIS — I2510 Atherosclerotic heart disease of native coronary artery without angina pectoris: Secondary | ICD-10-CM

## 2013-01-01 DIAGNOSIS — I480 Paroxysmal atrial fibrillation: Secondary | ICD-10-CM

## 2013-01-01 DIAGNOSIS — I200 Unstable angina: Secondary | ICD-10-CM

## 2013-01-01 DIAGNOSIS — I10 Essential (primary) hypertension: Secondary | ICD-10-CM

## 2013-01-01 DIAGNOSIS — I495 Sick sinus syndrome: Secondary | ICD-10-CM

## 2013-01-01 DIAGNOSIS — I4891 Unspecified atrial fibrillation: Secondary | ICD-10-CM

## 2013-01-01 DIAGNOSIS — I4589 Other specified conduction disorders: Secondary | ICD-10-CM

## 2013-01-01 DIAGNOSIS — R001 Bradycardia, unspecified: Secondary | ICD-10-CM

## 2013-01-01 DIAGNOSIS — R55 Syncope and collapse: Secondary | ICD-10-CM

## 2013-01-01 DIAGNOSIS — I498 Other specified cardiac arrhythmias: Secondary | ICD-10-CM

## 2013-01-01 NOTE — Patient Instructions (Signed)
No changes in medication.  Continue wearing the monitor,will contact you with results  Your physician wants you to follow-up in 3 months Dr Herbie Baltimore.  You will receive a reminder letter in the mail two months in advance. If you don't receive a letter, please call our office to schedule the follow-up appointment.

## 2013-01-03 ENCOUNTER — Ambulatory Visit: Payer: Medicare Other | Admitting: Cardiology

## 2013-01-04 NOTE — Assessment & Plan Note (Signed)
I don't think the recent episode she had is related to her unstable angina type symptoms. Myoview was negative for ischemia.

## 2013-01-04 NOTE — Assessment & Plan Note (Signed)
She clearly has tachybradycardia syndrome. This is probably not severe enough to warrant pacemaker at this time, unless rates truly in the 30s are noted. She is only on 30 mg of metoprolol, which does not keep her from going fast that she's had several episodes of heart rates in the 130s to 150s.  She is stable and anticoagulated with warfarin. I will have her continue to monitor for the next 2 weeks to see if any further episodes of bradycardia. One other consideration would be would be considered rhythm control agents that may not be a significant and rate control. Options are limited with her coronary disease.

## 2013-01-04 NOTE — Assessment & Plan Note (Signed)
Doing relatively well on statin and beta blocker blood pressure currently not significant enough to warrant ACE inhibitor versus ARB. No heart failure symptoms.

## 2013-01-04 NOTE — Assessment & Plan Note (Signed)
I actually don't know what the cause of her syncope was. The episode happened with a heart rate of 50 beats a minute, which would discount bradycardia mediated syncope. She was sitting down so without orthostatic. And no other arrhythmia was noted. This would suggest a potentially noncardiac etiology which is what Dr. Jason Coop thought.  She has another episode, we may need to consider some type of tilt table test or other mechanism to determine the etiology.

## 2013-01-04 NOTE — Assessment & Plan Note (Signed)
Previously well controlled, however she had some hypotension. Therefore the amlodipine was discontinued. Now only on metoprolol, low-dose, but with notable effectiveness as the blood pressure is well-controlled.

## 2013-01-04 NOTE — Assessment & Plan Note (Signed)
She certainly has a tachycardia complement with heart rates in the 130s to 150s. However the bradycardia is not significant enough to be overly symptomatic. At this point we will use low-dose beta blocker for rate control with additional when necessary one half dose for right heart rates.

## 2013-01-04 NOTE — Assessment & Plan Note (Signed)
Well-controlled on warfarin. Followed by Phillips Hay, Pharm.D.

## 2013-01-04 NOTE — Progress Notes (Signed)
Patient ID: Abigail Boyd, female   DOB: [birth year], 79 y.o.   MRN: [id number] PCP: Raliegh Ip, MD Clinic Note:  CHIEF COMPLAINT: Chief Complaint  Patient presents with  . Follow-up    post hosp. - episode at hair stylist  (88/56, PULSE 44 , unreponsive came to -went hospital, no chest pain, no sob ,edema   HPI: Abigail Boyd is a 79 y.o. female with a PMH below who presents today for routine followup  She is a very pleasant woman, and former patient of Dr. Julieanne Manson, who I first met her on a performed her cardiac catheterization and PCI in May 2013 after an abnormal Myoview in April 2013. Her symptoms that she described at that time were left-sided jaw (CHEEK) pain with shortness of breath and was made worse with exertion -- she did not note chest discomfort until the jaw pain radiated into her chest and throat. That was what led to a stress test.  She also has a history of proximal atrial fibrillation and had a CPET (cardiopulmonary exercise test) evaluation for shortness of breath back in the fall and this showed contributing competence. We have reduced her beta blocker dose since then. She does not take ACE inhibitors or ARB due to cough. Therefore she is on amlodipine. She is anticoagulated on warfarin  I last saw her in June, however she saw Nada Boozer October 21 after she was found to be in A. fib with RVR during cardiac rehabilitation. She spontaneously converted. Did note some mild dyspnea on exertion along with bilateral neck pain at that time. She was evaluated with a Myoview for potential ischemia. She also had a monitor to evaluate her atrial fib burden. Shortly thereafter, she was admitted on October 23 with what seemed to be a syncopal episode. Essentially, after having been placed on the event monitor, she went to beauty salon and was having her hair done down with curlers in that was not yet under the dryer. Her hairdresser reports that she essentially slumped  forward and stopped speaking. Initially the patient asked if her color changed. The head is noted that she did indeed look pale and was diaphoretic. Her eyes were open but she did not respond speech. She weak and thready pulse. The original report was that the heart rate was measured at 30 beats per minute. This however was not confirmed after interrogation of her monitor revealed note slower and 50. She was also hypotensive with blood pressures in the 70s.  There was evidence of sinus bradycardia on telemetry, but not in the 30s -- actually in the 50s. The timing was associated with her having felt lightheaded and short of breath and dizzy/fainting. There was also confirmation that she had also been A. fib RVR earlier that day. She did not however notice any upper anginal jaw pain. She currently is still wearing the monitor.   Initial thought was that she probably would benefit from a pacemaker placement for tachybradycardia syndrome. However ENT evaluation by Dr. Clide Cliff suggested a possible neural mediated syncope as opposed tachybradycardia syndrome mediated syncope. There was no documentation of heart rates as low as the 30s they would be consistent with a rhythm significant enough to cause syncope.  She just had a stress test performed (LexiScan Myoview: That failed to show any evidence of ischemia or infarction.  Interval History:  Today actually she seems to be doing fairly well. She's not had any more syncopal episodes. She really does not notice  it when she is in A. fib or not. She is noting some exertional dyspnea with going up and downstairs. This is A new thing for her. She also wanted to know if she needed to be on aspirin while she is on warfarin. (I simply feel if she can be on warfarin without aspirin for now.) She denies any melena, hematochezia or hematuria. Past Medical History  Diagnosis Date  . Allergy   . Anxiety     no meds  . Cataract     reports she had cataracts removed.  .  Glaucoma   . Diabetes mellitus     On oral medications  . GERD (gastroesophageal reflux disease)   . Hyperlipidemia   . Hypertension   . Chronic kidney disease     hx bladder infections  . Spinal stenosis   . Abnormal nuclear stress test 06/21/2011    Inferolateral reversible defect; he underwent cardiac catheterization and PCI of circumflex OM, occluded RCA with collaterals  . Unstable angina - history of 06/13/2011    Jaw pain awakening from sleep  . Coronary artery disease     s/P PCI to proximal OM1 Boyd/ DES; occluded RCA with bridging and left to right collaterals  . Shortness of breath 11/2011    Evaluated with CPET - peak VO2 97%  . Arthritis   . Dyslipidemia   . Spinal stenosis   . PAF (paroxysmal atrial fibrillation)   . Chronotropic incompetence 11/2011    On CPET test; beta blockers reduced    Prior Cardiac Evaluation and Past Surgical History: Past Surgical History  Procedure Laterality Date  . Cataracts      both eyes  . Colonoscopy    . Vaginal hysterectomy    . Breast biopsy      both breast  . Cardiac catheterization  07/05/2011    Proximal OM1 lesion --> PCI; 100% mid RCA with right to right and left to right bridging collaterals from circumflex RPL and LAD the PDA.  Marland Kitchen Coronary angioplasty with stent placement  07/05/2011    Promus Element DES 2.5 mm x 16 mm-post dilated to 2.65 mm CX-Proximal OM1    Allergies  Allergen Reactions  . Lisinopril Cough  . Losartan Cough  . Sulfa Antibiotics Hives    Current Outpatient Prescriptions  Medication Sig Dispense Refill  . ALPRAZolam (XANAX) 0.25 MG tablet Take 0.25 mg by mouth at bedtime as needed for sleep or anxiety.       Marland Kitchen azelastine (ASTELIN) 137 MCG/SPRAY nasal spray Place 1 spray into both nostrils daily.       . cholecalciferol (VITAMIN D) 1000 UNITS tablet Take 1,000 Units by mouth daily.      Marland Kitchen dexlansoprazole (DEXILANT) 60 MG capsule Take 60 mg by mouth daily.      Marland Kitchen docusate sodium (COLACE) 100 MG  capsule Take 100 mg by mouth at bedtime.       . fexofenadine (ALLEGRA) 180 MG tablet Take 180 mg by mouth daily.      . fish oil-omega-3 fatty acids 1000 MG capsule Take 1 g by mouth daily.       Marland Kitchen glimepiride (AMARYL) 2 MG tablet Take 2 mg by mouth daily before breakfast.      . ipratropium (ATROVENT) 0.06 % nasal spray Place 1 spray into the nose at bedtime.       . Magnesium 500 MG TABS Take 500 mg by mouth at bedtime.      . metFORMIN (GLUCOPHAGE) 1000  MG tablet Take 1,000 mg by mouth 2 (two) times daily with a meal.      . metoprolol tartrate (LOPRESSOR) 25 MG tablet Take 12.5 mg by mouth 2 (two) times daily.       . nitroGLYCERIN (NITROSTAT) 0.4 MG SL tablet Place 0.4 mg under the tongue every 5 (five) minutes as needed for chest pain.      Marland Kitchen PARoxetine (PAXIL) 10 MG tablet Take 10 mg by mouth daily.       . rosuvastatin (CRESTOR) 20 MG tablet Take 10 mg by mouth at bedtime.       Marland Kitchen warfarin (COUMADIN) 5 MG tablet Take 5-7.5 mg by mouth at bedtime. Take 1 1/2 tablets (7.5 mg) on Sunday, and take 1 tablet (5 mg) on all other days       No current facility-administered medications for this visit.    History   Social History  . Marital Status: Widowed    Spouse Name: N/A    Number of Children: N/A  . Years of Education: N/A   Occupational History  . Not on file.   Social History Main Topics  . Smoking status: Never Smoker   . Smokeless tobacco: Never Used  . Alcohol Use: No  . Drug Use: No  . Sexual Activity: No   Other Topics Concern  . Not on file   Social History Narrative   Widowed mother of one, grandmother 2. Has not been using silver take as much lately due to knee discomfort. Usually exercises with Silver Sneakers on regular basis.    ROS: A comprehensive Review of Systems - Negative except Pertinent positives above. Musculoskeletal ROS: positive for - joint pain, joint swelling and pain in knee - right, This causes her to have some swelling of that ankle as  well. She continues to complain about is her knees hurting and it really been decreasing her ability to do Silver sneakers.   PHYSICAL EXAM BP 120/60  Pulse 69  Ht 5\' 3"  (1.6 m)  Wt 162 lb 11.2 oz (73.8 kg)  BMI 28.83 kg/m2 General appearance: alert, cooperative, appears stated age, no distress and Very pleasant mood and affect appeared well-nourished, well-groomed. Neck: no adenopathy, no carotid bruit, no JVD, supple, symmetrical, trachea midline and thyroid not enlarged, symmetric, no tenderness/mass/nodules Lungs: clear to auscultation bilaterally, normal percussion bilaterally and Nonlabored, good air movement Heart: regular rate and rhythm, S1, S2 normal, no S3 or S4, systolic murmur: systolic ejection 1/6, medium pitch, crescendo and decrescendo at 2nd right intercostal space, radiates to carotids and no rub Abdomen: soft, non-tender; bowel sounds normal; no masses,  no organomegaly Extremities: extremities normal, atraumatic, no cyanosis or edema, no edema, redness or tenderness in the calves or thighs, no ulcers, gangrene or trophic changes and varicose veins noted Pulses: 2+ and symmetric Neurologic: Mental status: Alert, oriented, thought content appropriate, She does have some mild forgetfulness. Cranial nerves: normal [place]/AT, EOMI, MMM, anicteric sclera  [id number] today: Yes Rate: 69 , Rhythm: Normal sinus, normal ECG;   ASSESSMENT/PLAN: Still remains on true etiology for her syncope was. I think we just need to continue to monitor her closely. She is certainly borderline criteria for pacemaker. We'll continue to discuss with Dr. Royann Shivers as well as the EP consult service.  PAF (paroxysmal atrial fibrillation) She clearly has tachybradycardia syndrome. This is probably not severe enough to warrant pacemaker at this time, unless rates truly in the 30s are noted. She is only on 30 mg of metoprolol,  which does not keep her from going fast that she's had several episodes of  heart rates in the 130s to 150s.  She is stable and anticoagulated with warfarin. I will have her continue to monitor for the next 2 weeks to see if any further episodes of bradycardia. One other consideration would be would be considered rhythm control agents that may not be a significant and rate control. Options are limited with her coronary disease.  Syncope I actually don't know what the cause of her syncope was. The episode happened with a heart rate of 50 beats a minute, which would discount bradycardia mediated syncope. She was sitting down so without orthostatic. And no other arrhythmia was noted. This would suggest a potentially noncardiac etiology which is what Dr. Jason Coop thought.  She has another episode, we may need to consider some type of tilt table test or other mechanism to determine the etiology.  Tachy-brady syndrome She certainly has a tachycardia complement with heart rates in the 130s to 150s. However the bradycardia is not significant enough to be overly symptomatic. At this point we will use low-dose beta blocker for rate control with additional when necessary one half dose for right heart rates.  Unstable angina - history of; resolved, status post PCI of the OM I don't think the recent episode she had is related to her unstable angina type symptoms. Myoview was negative for ischemia.  CAD, total RCA with collat, OM1 DES 07/06/2011 Doing relatively well on statin and beta blocker blood pressure currently not significant enough to warrant ACE inhibitor versus ARB. No heart failure symptoms.  Long term (current) use of anticoagulants Well-controlled on warfarin. Followed by Phillips Hay, Pharm.D.  Hypertension Previously well controlled, however she had some hypotension. Therefore the amlodipine was discontinued. Now only on metoprolol, low-dose, but with notable effectiveness as the blood pressure is well-controlled.    Followup: One year  Abigail Boyd, M.D.,  M.S. [facility] [address] [place], [place]  [address]  [phone] Pager # [phone] 01/04/2013 3:59 AM

## 2013-01-28 ENCOUNTER — Encounter: Payer: Self-pay | Admitting: Cardiology

## 2013-01-28 ENCOUNTER — Ambulatory Visit (INDEPENDENT_AMBULATORY_CARE_PROVIDER_SITE_OTHER): Payer: Medicare Other | Admitting: Pharmacist Clinician (PhC)/ Clinical Pharmacy Specialist

## 2013-01-28 ENCOUNTER — Ambulatory Visit (INDEPENDENT_AMBULATORY_CARE_PROVIDER_SITE_OTHER): Payer: Medicare Other | Admitting: Cardiology

## 2013-01-28 VITALS — BP 122/68 | HR 72 | Ht 63.0 in | Wt 166.4 lb

## 2013-01-28 DIAGNOSIS — I480 Paroxysmal atrial fibrillation: Secondary | ICD-10-CM

## 2013-01-28 DIAGNOSIS — I2510 Atherosclerotic heart disease of native coronary artery without angina pectoris: Secondary | ICD-10-CM

## 2013-01-28 DIAGNOSIS — R55 Syncope and collapse: Secondary | ICD-10-CM

## 2013-01-28 DIAGNOSIS — I4891 Unspecified atrial fibrillation: Secondary | ICD-10-CM

## 2013-01-28 DIAGNOSIS — I10 Essential (primary) hypertension: Secondary | ICD-10-CM

## 2013-01-28 DIAGNOSIS — I495 Sick sinus syndrome: Secondary | ICD-10-CM

## 2013-01-28 DIAGNOSIS — I4589 Other specified conduction disorders: Secondary | ICD-10-CM

## 2013-01-28 DIAGNOSIS — Z7901 Long term (current) use of anticoagulants: Secondary | ICD-10-CM

## 2013-01-28 DIAGNOSIS — E785 Hyperlipidemia, unspecified: Secondary | ICD-10-CM

## 2013-01-28 LAB — POCT INR: INR: 2.4

## 2013-01-28 NOTE — Assessment & Plan Note (Signed)
Recently increased to 20 mg of Crestor. With followup labs per PCP.

## 2013-01-28 NOTE — Progress Notes (Signed)
Patient ID: Abigail Boyd, female   DOB: 01/31/1930, 79 y.o.   MRN: [id number] PCP: Raliegh Ip, MD Clinic Note:  CHIEF COMPLAINT: Chief Complaint  Patient presents with  . Follow-up    1 month:  No chest pain.  Occas. SOB w/activity.  Occas. mild edema.  No dizziness.   HPI: Abigail Boyd is a 79 y.o. female with a PMH below who presents today for followup after event monitor.  She is a very pleasant  former patient of Dr. Julieanne Manson, who I first met her on a performed her cardiac catheterization and PCI in May 2013 after an abnormal Myoview in April 2013.   She also has a history of proximal atrial fibrillation and had a CPET (cardiopulmonary exercise test) evaluation for shortness of breath back in the fall and this showed chronotropic incompetence that lead [place] to reduce her beta blocker dose which seemingly improved her energy levels. She has been consistently antiplatelet with warfarin.   last saw her in November it was a followup from a hospitalization for a syncopal episode while at the salon. There is report of a palpable pulse in the 30s, that was not confirmed by the monitor that she was currently wearing. Her lowest heart rate at that time was in the 50s. She did however continue to monitor with the results noted below. Essentially showing very frequent, yet brief episodes of atrial fibrillation, mostly with rapid rate.  EP evaluation by Dr. Graciela Husbands suggested a possible neural mediated syncope as opposed tachybradycardia syndrome mediated syncope. There was no documentation of heart rates as low as the 30s they would be consistent with a rhythm significant enough to cause syncope.  She just had a stress test performed (LexiScan Myoview: That failed to show any evidence of ischemia or infarction.  Interval History:  Today actually she seems to be doing fairly well. She's not had any more syncopal episodes. She really does not notice it when she is in A. fib or not. She is  noting some exertional dyspnea with going up and downstairs, but the most notable thing is the intermittent episodes of profound fatigue and weakness. This is A new thing for her. She denies any melena, hematochezia or hematuria.  No chest pain with rest or exertion. No shortness of breath rest, or with exertion on most occasions. No PND, orthopnea or edema. No sense of palpitations, lightheadedness, dizziness, weakness or syncope/near syncope. No TIA/amaurosis fugax symptoms. No melena, hematochezia hematuria. No nose bleeds. No claudication.   Past Medical History  Diagnosis Date  . Allergy   . Anxiety     no meds  . Cataract     reports she had cataracts removed.  . Glaucoma   . Diabetes mellitus     On oral medications  . GERD (gastroesophageal reflux disease)   . Hyperlipidemia   . Hypertension   . Chronic kidney disease     hx bladder infections  . Spinal stenosis   . Abnormal nuclear stress test 06/21/2011    Inferolateral reversible defect;--> cardiac cath & PCI of CxOM, occluded RCA with collaterals;; followup Myoview 11/2012: Low risk. Fixed basal inferior artifact normal EF. No ischemia  . Unstable angina - history of 06/13/2011    Jaw pain awakening from sleep  . Coronary artery disease     s/P PCI to proximal OM1 w/ DES; occluded RCA with bridging and left to right collaterals  . Shortness of breath 11/2011    Evaluated with CPET -  peak VO2 97%  . Arthritis   . Dyslipidemia   . Spinal stenosis   . PAF (paroxysmal atrial fibrillation)     [employer] Event Monitor (October 21 January 17 2013): Sinus rhythm and sinus bradycardia rates ranging from 5200 total A. fib burden 35 hours and 27 minutes. 1296 episodes, longest was 1 hour 29 minutes. Rate ranged from 52-169 beats per minute.  . Chronotropic incompetence 11/2011    On CPET test; beta blockers reduced    Prior Cardiac Evaluation and Past Surgical History: Past Surgical History  Procedure Laterality Date  .  Cataracts      both eyes  . Colonoscopy    . Vaginal hysterectomy    . Breast biopsy      both breast  . Cardiac catheterization  07/05/2011    Proximal OM1 lesion --> PCI; 100% mid RCA with right to right and left to right bridging collaterals from circumflex RPL and LAD the PDA.  Marland Kitchen Coronary angioplasty with stent placement  07/05/2011    Promus Element DES 2.5 mm x 16 mm-post dilated to 2.65 mm CX-Proximal OM1  . Nm myoview ltd  October 2014     Low risk. Fixed basal inferior artifact normal EF. No ischemia    Allergies  Allergen Reactions  . Lisinopril Cough  . Losartan Cough  . Sulfa Antibiotics Hives    Current Outpatient Prescriptions  Medication Sig Dispense Refill  . ALPRAZolam (XANAX) 0.25 MG tablet Take 0.25 mg by mouth at bedtime as needed for sleep or anxiety.       Marland Kitchen azelastine (ASTELIN) 137 MCG/SPRAY nasal spray Place 1 spray into both nostrils daily.       . cholecalciferol (VITAMIN D) 1000 UNITS tablet Take 1,000 Units by mouth daily.      Marland Kitchen dexlansoprazole (DEXILANT) 60 MG capsule Take 60 mg by mouth daily.      Marland Kitchen docusate sodium (COLACE) 100 MG capsule Take 100 mg by mouth at bedtime.       . fexofenadine (ALLEGRA) 180 MG tablet Take 180 mg by mouth daily.      . fish oil-omega-3 fatty acids 1000 MG capsule Take 1 g by mouth daily.       Marland Kitchen glimepiride (AMARYL) 2 MG tablet Take 2 mg by mouth daily before breakfast.      . ipratropium (ATROVENT) 0.06 % nasal spray Place 1 spray into the nose at bedtime.       . Magnesium 500 MG TABS Take 500 mg by mouth 3 times/day as needed-between meals & bedtime.       . metFORMIN (GLUCOPHAGE) 1000 MG tablet Take 1,000 mg by mouth 2 (two) times daily with a meal.      . metoprolol tartrate (LOPRESSOR) 25 MG tablet Take 12.5 mg by mouth 2 (two) times daily.       . nitroGLYCERIN (NITROSTAT) 0.4 MG SL tablet Place 0.4 mg under the tongue every 5 (five) minutes as needed for chest pain.      Marland Kitchen PARoxetine (PAXIL) 10 MG tablet Take 10  mg by mouth daily.       . rosuvastatin (CRESTOR) 20 MG tablet Take 10 mg by mouth at bedtime.       Marland Kitchen warfarin (COUMADIN) 5 MG tablet Take 5-7.5 mg by mouth at bedtime. Take 1 1/2 tablets (7.5 mg) on Sunday, and take 1 tablet (5 mg) on all other days       No current facility-administered medications for this visit.  History   Social History  . Marital Status: Widowed    Spouse Name: N/A    Number of Children: N/A  . Years of Education: N/A   Occupational History  . Not on file.   Social History Main Topics  . Smoking status: Never Smoker   . Smokeless tobacco: Never Used  . Alcohol Use: No  . Drug Use: No  . Sexual Activity: No   Other Topics Concern  . Not on file   Social History Narrative   Widowed mother of one, grandmother 2. Has not been using silver take as much lately due to knee discomfort. Usually exercises with Silver Sneakers on regular basis.    ROS: A comprehensive Review of Systems - Negative except Pertinent positives above. Musculoskeletal ROS: positive for - joint pain, joint swelling and pain in knee - right, This causes her to have some swelling of that ankle as well. this pain notably reduces her ability to do exercise with Silver Sneakers.   PHYSICAL EXAM BP 122/68  Pulse 72  Ht 5\' 3"  (1.6 m)  Wt 166 lb 6.4 oz (75.479 kg)  BMI 29.48 kg/m2 General appearance: alert, cooperative, appears stated age, no distress and Very pleasant mood and affect appeared well-nourished, well-groomed. Neck: no adenopathy, no carotid bruit, no JVD, supple, symmetrical, trachea midline and thyroid not enlarged, symmetric, no tenderness/mass/nodules Lungs: CTA B., normal percussion bilaterally and Nonlabored, good air movement Heart:  RRR, S1, S2 normal, no S3 or S4; 1/24medium pitch, crescendo and decrescendo SEM at 2nd right intercostal space, radiates to carotids and no rub Abdomen: soft, non-tender; bowel sounds normal; no masses,  no organomegaly Extremities:  extremities normal, atraumatic, no cyanosis or edema, no edema, redness or tenderness in the calves or thighs, no ulcers, gangrene or trophic changes and varicose veins noted Pulses: 2+ and symmetric Neurologic: Mental status: Alert, oriented, thought content appropriate, She does have some mild forgetfulness. Cranial nerves: normal [place]/AT, EOMI, MMM, anicteric sclera  [id number] today: Yes Rate: 72, Rhythm: Normal sinus, normal ECG;   ASSESSMENT/PLAN:  Syncope Thankfully she has not had any further episodes. The true etiology is unclear. While neurocardiogenic syncope is a possibility that would be corroborated by her hypotension on arrival to the emergency room, there was no notable tachycardia at the time of her syncope. She has a recurrent episode. Would consider tilt table test.  Tachy-brady syndrome: Rates range from 50-169 bpm in A. fib With chronotropic incompetence in the setting of increase beta blocker dose, and monitor readings as low as 50 beats per minute on simply 12.5 mg of metoprolol really does not need as much in the way of rate control. She is not yet had the bradycardia that would be significant enough to warrant a pacemaker. However with her rapid tachycardia that does seem to be symptomatic as far as her fatigue and exertional dyspnea, I wonder if allowing for improved rate control with pacemaker backup would improve her symptoms versus rhythm control.  Plan: We'll refer to Dr. Graciela Husbands, from Electrophysiology. He has seen her in the hospital.  PAF (paroxysmal atrial fibrillation) Multiple episodes of rapid rates, but also some bradycardia rates with tachybradycardia syndrome components.   Again I am reluctant to add any additional rate control with her chronotropic incompetence. She does seem to be symptomatic when she is in atrial fibrillation siting dyspnea and fatigue that is intermittent.. With her CAD our options for rhythm control were also limited. I will be  reluctant to use Multaq or  amiodarone for fear of worsening her bradycardia.  Plan: Refer back to Electrophysiology, Dr. Graciela Husbands; recent peripheral would be to determine the appropriateness of considering antiarrhythmic therapy, as well as the potential need for pacemaker with more aggressive rate control.  CAD, total RCA with collat, OM1 DES 07/06/2011 No active symptoms with inability normal stress test recently. She is only on warfarin, as it has been over a year since her stent. I would otherwise concern for possible bleeding risk. She is on low-dose beta blocker and statin as well as fish oil.   As she is not having any active symptoms. I do not plan to make any adjustments.  Hypertension Based on her relative hypotension in the hospital. I agree with stopping amlodipine. She now remains on low-dose beta blocker with adequately controlled blood pressure. I think permissive hypertension as high as 150 mmHg systolic would be acceptable.  Chronotropic incompetence - partially medication related Her symptoms that seem to be consistent with chronotropic incompetence have improved with the reduced beta blocker dose. This is complicating factor when considering her tachybradycardia syndrome.  Hyperlipidemia Recently increased to 20 mg of Crestor. With followup labs per PCP.   Followup: 3 months  Tamir Wallman W, M.D., M.S. [facility] [address] [place], [place]  [address]  [phone] Pager # [phone] 01/28/2013 11:09 PM

## 2013-01-28 NOTE — Patient Instructions (Addendum)
Follow-up with Dr. Graciela Husbands with his first available appointment.  We will schedule an appointment ASAP.  Dr. Herbie Baltimore recommends that you schedule a follow-up appointment in: 3 months 3

## 2013-01-28 NOTE — Assessment & Plan Note (Signed)
Based on her relative hypotension in the hospital. I agree with stopping amlodipine. She now remains on low-dose beta blocker with adequately controlled blood pressure. I think permissive hypertension as high as 150 mmHg systolic would be acceptable.

## 2013-01-28 NOTE — Assessment & Plan Note (Signed)
Her symptoms that seem to be consistent with chronotropic incompetence have improved with the reduced beta blocker dose. This is complicating factor when considering her tachybradycardia syndrome.

## 2013-01-28 NOTE — Assessment & Plan Note (Signed)
Thankfully she has not had any further episodes. The true etiology is unclear. While neurocardiogenic syncope is a possibility that would be corroborated by her hypotension on arrival to the emergency room, there was no notable tachycardia at the time of her syncope. She has a recurrent episode. Would consider tilt table test.

## 2013-01-28 NOTE — Assessment & Plan Note (Signed)
No active symptoms with inability normal stress test recently. She is only on warfarin, as it has been over a year since her stent. I would otherwise concern for possible bleeding risk. She is on low-dose beta blocker and statin as well as fish oil.   As she is not having any active symptoms. I do not plan to make any adjustments.

## 2013-01-28 NOTE — Assessment & Plan Note (Signed)
With chronotropic incompetence in the setting of increase beta blocker dose, and monitor readings as low as 50 beats per minute on simply 12.5 mg of metoprolol really does not need as much in the way of rate control. She is not yet had the bradycardia that would be significant enough to warrant a pacemaker. However with her rapid tachycardia that does seem to be symptomatic as far as her fatigue and exertional dyspnea, I wonder if allowing for improved rate control with pacemaker backup would improve her symptoms versus rhythm control.  Plan: We'll refer to Dr. Graciela Husbands, from Electrophysiology. He has seen her in the hospital.

## 2013-01-28 NOTE — Assessment & Plan Note (Signed)
Multiple episodes of rapid rates, but also some bradycardia rates with tachybradycardia syndrome components.   Again I am reluctant to add any additional rate control with her chronotropic incompetence. She does seem to be symptomatic when she is in atrial fibrillation siting dyspnea and fatigue that is intermittent.. With her CAD our options for rhythm control were also limited. I will be reluctant to use Multaq or amiodarone for fear of worsening her bradycardia.  Plan: Refer back to Electrophysiology, Dr. Graciela Husbands; recent peripheral would be to determine the appropriateness of considering antiarrhythmic therapy, as well as the potential need for pacemaker with more aggressive rate control.

## 2013-01-29 ENCOUNTER — Encounter: Payer: Self-pay | Admitting: Cardiology

## 2013-01-29 ENCOUNTER — Ambulatory Visit: Payer: Medicare Other | Admitting: Pharmacist Clinician (PhC)/ Clinical Pharmacy Specialist

## 2013-01-30 ENCOUNTER — Telehealth: Payer: Self-pay | Admitting: *Deleted

## 2013-01-30 NOTE — Telephone Encounter (Signed)
Message copied by Tobin Chad on Thu Jan 30, 2013  3:40 PM ------      Message from: Marykay Lex      Created: Wed Jan 29, 2013 11:34 AM       NSR rates 60-100 bpm      Mena Pauls with rates as low as ~50 bpm      ~ Sinus Tachy -vs Afib with rates up to 169 (looks more like Afib)            PAF - 5%, 35h46min total; 1296 occurences. 1hr46min longest episode (mostly autotrigger,only noted Sx was fatigue & malaise on clinical correlation)      Rates: 52 - 178 bpm.            Marykay Lex, M.D., M.S.      [facility]      [address]      [place], [place]  [address]       [phone]      Pager # [phone]      01/29/2013       ------

## 2013-01-30 NOTE — Telephone Encounter (Signed)
Spoke to patient.Monitor Result given . Verbalized understanding Patient has an appointment with Dr Graciela Husbands in 02/2013

## 2013-02-03 ENCOUNTER — Ambulatory Visit: Payer: Medicare Other | Admitting: Cardiology

## 2013-02-25 ENCOUNTER — Encounter: Payer: Self-pay | Admitting: *Deleted

## 2013-03-05 ENCOUNTER — Encounter: Payer: Self-pay | Admitting: Internal Medicine

## 2013-03-05 ENCOUNTER — Ambulatory Visit (INDEPENDENT_AMBULATORY_CARE_PROVIDER_SITE_OTHER): Payer: Medicare Other | Admitting: Internal Medicine

## 2013-03-05 VITALS — BP 186/84 | HR 71 | Ht 63.0 in | Wt 167.6 lb

## 2013-03-05 DIAGNOSIS — G4733 Obstructive sleep apnea (adult) (pediatric): Secondary | ICD-10-CM | POA: Insufficient documentation

## 2013-03-05 DIAGNOSIS — I495 Sick sinus syndrome: Secondary | ICD-10-CM

## 2013-03-05 DIAGNOSIS — I4589 Other specified conduction disorders: Secondary | ICD-10-CM

## 2013-03-05 DIAGNOSIS — R0989 Other specified symptoms and signs involving the circulatory and respiratory systems: Secondary | ICD-10-CM

## 2013-03-05 DIAGNOSIS — I480 Paroxysmal atrial fibrillation: Secondary | ICD-10-CM

## 2013-03-05 DIAGNOSIS — R0609 Other forms of dyspnea: Secondary | ICD-10-CM

## 2013-03-05 DIAGNOSIS — I4891 Unspecified atrial fibrillation: Secondary | ICD-10-CM

## 2013-03-05 DIAGNOSIS — R0683 Snoring: Secondary | ICD-10-CM

## 2013-03-05 NOTE — Patient Instructions (Signed)
Your physician recommends that you continue on your current medications as directed. Please refer to the Current Medication list given to you today.  Your physician has recommended that you have a sleep study. This test records several body functions during sleep, including: brain activity, eye movement, oxygen and carbon dioxide blood levels, heart rate and rhythm, breathing rate and rhythm, the flow of air through your mouth and nose, snoring, body muscle movements, and chest and belly movement.  No follow up is needed

## 2013-03-05 NOTE — Assessment & Plan Note (Signed)
The patient has significant snoring as well as a.m. fatigue and daytime somnolence. Certainly prior to proceeding with any electrical interventions, a sleep study is indicated as the pretest likelihood is extremely high given her symptoms

## 2013-03-05 NOTE — Progress Notes (Signed)
Patient Care Team: Morton Peters., MD as PCP - General (Unknown Physician Specialty)   HPI  Abigail Boyd is a 80 y.o. female Seen in followup following a syncopal episode October 2014. At that time I saw her in consultation. It is my impression that the event was likely neurally mediated. She had been wearing an event recorder at the time and there is no associated bradycardia documented at the time of her period  She history of paroxysmal atrial fibrillation and her rate controlling medications were adjusted because of "chronotropic incompetence". She's had some resting bradycardia. Paroxysms of atrial fibrillation have been difficult to treat because of bradycardia precluding amiodarone and coronary disease precluding 1C therapy.  She is referred for consideration of treatment options  Her major complaint is fatigue. She wakes up in the morning tired. She falls asleep at the breakfast table sitting in her chair at anytime of the day and in the evenings it certainly. She has a history of snoring and his back many many years back before her husband died about 5 years ago even.  There are no new medications.  Last year she underwent cardiopulmonary stress testing with a submaximal effort. Peak heart rate recorded was 93. This represented 66% of her predicted max    Past Medical History  Diagnosis Date  . Allergy   . Anxiety     no meds  . Cataract     reports she had cataracts removed.  . Glaucoma   . Diabetes mellitus     On oral medications  . GERD (gastroesophageal reflux disease)   . Hyperlipidemia   . Hypertension   . Chronic kidney disease     hx bladder infections  . Spinal stenosis   . Abnormal nuclear stress test 06/21/2011    Inferolateral reversible defect;--> cardiac cath & PCI of CxOM, occluded RCA with collaterals;; followup Myoview 11/2012: Low risk. Fixed basal inferior artifact normal EF. No ischemia  . Unstable angina - history of 06/13/2011      Jaw pain awakening from sleep  . Coronary artery disease     s/P PCI to proximal OM1 w/ DES; occluded RCA with bridging and left to right collaterals  . Shortness of breath 11/2011    Evaluated with CPET - peak VO2 97%  . Arthritis   . Dyslipidemia   . Spinal stenosis   . PAF (paroxysmal atrial fibrillation)     CardioNet Event Monitor (October 21 January 17 2013): Sinus rhythm and sinus bradycardia rates ranging from 5200 total A. fib burden 35 hours and 27 minutes. 1296 episodes, longest was 1 hour 29 minutes. Rate ranged from 52-169 beats per minute.  . Chronotropic incompetence 11/2011    On CPET test; beta blockers reduced    Past Surgical History  Procedure Laterality Date  . Cataracts      both eyes  . Colonoscopy    . Vaginal hysterectomy    . Breast biopsy      both breast  . Cardiac catheterization  07/05/2011    Proximal OM1 lesion --> PCI; 100% mid RCA with right to right and left to right bridging collaterals from circumflex RPL and LAD the PDA.  Marland Kitchen Coronary angioplasty with stent placement  07/05/2011    Promus Element DES 2.5 mm x 16 mm-post dilated to 2.65 mm CX-Proximal OM1  . Nm myoview ltd  October 2014     Low risk. Fixed basal inferior artifact normal EF. No ischemia  Current Outpatient Prescriptions  Medication Sig Dispense Refill  . ALPRAZolam (XANAX) 0.25 MG tablet Take 0.25 mg by mouth at bedtime as needed for sleep or anxiety.       Marland Kitchen azelastine (ASTELIN) 137 MCG/SPRAY nasal spray Place 1 spray into both nostrils daily.       . cholecalciferol (VITAMIN D) 1000 UNITS tablet Take 1,000 Units by mouth daily.      Marland Kitchen dexlansoprazole (DEXILANT) 60 MG capsule Take 60 mg by mouth daily.      Marland Kitchen docusate sodium (COLACE) 100 MG capsule Take 100 mg by mouth at bedtime.       . fexofenadine (ALLEGRA) 180 MG tablet Take 180 mg by mouth daily.      . fish oil-omega-3 fatty acids 1000 MG capsule Take 1 g by mouth daily.       Marland Kitchen glimepiride (AMARYL) 2 MG tablet  Take 2 mg by mouth daily before breakfast.      . ipratropium (ATROVENT) 0.06 % nasal spray Place 1 spray into the nose at bedtime.       . Magnesium 500 MG TABS Take 500 mg by mouth 3 times/day as needed-between meals & bedtime.       . metFORMIN (GLUCOPHAGE) 1000 MG tablet Take 1,000 mg by mouth 2 (two) times daily with a meal.      . metoprolol tartrate (LOPRESSOR) 25 MG tablet Take 12.5 mg by mouth 2 (two) times daily.       . nitroGLYCERIN (NITROSTAT) 0.4 MG SL tablet Place 0.4 mg under the tongue every 5 (five) minutes as needed for chest pain.      Marland Kitchen PARoxetine (PAXIL) 10 MG tablet Take 10 mg by mouth daily.       . rosuvastatin (CRESTOR) 20 MG tablet Take 10 mg by mouth at bedtime.       Marland Kitchen warfarin (COUMADIN) 5 MG tablet Take 5-7.5 mg by mouth at bedtime. Take 1 1/2 tablets (7.5 mg) on Sunday, and take 1 tablet (5 mg) on all other days       No current facility-administered medications for this visit.    Allergies  Allergen Reactions  . Lisinopril Cough  . Losartan Cough  . Sulfa Antibiotics Hives    Review of Systems negative except from HPI and PMH  Physical Exam BP 186/84  Pulse 71  Ht 5\' 3"  (1.6 m)  Wt 167 lb 9.6 oz (76.023 kg)  BMI 29.70 kg/m2 Well developed and well nourished in no acute distress HENT normal E scleral and icterus clear Neck Supple JVP flat; carotids brisk and full Clear to ausculation  Regular rate and rhythm, no murmurs gallops or rub Soft with active bowel sounds No clubbing cyanosis none Edema Alert and oriented, grossly normal motor and sensory function Skin Warm and Dry  ECG demonstrates sinus rhythm at 70  Assessment and  Plan

## 2013-03-05 NOTE — Assessment & Plan Note (Signed)
Is as noted in cardiopulmonary stress testing a year ago. Today walking in the office her heart rate went up over 100. She had no limitations climbing a flight of stairs.

## 2013-03-05 NOTE — Assessment & Plan Note (Signed)
Paroxysmal atrial fibrillation has been documented with relatively rapid rates. Beta blockers and then resumed at low dose without appreciable impact in heart rate excursion or exercise tolerance. It is the patient's and family's feeling that the beta blockers are not associated with fatigue as it is long-standing

## 2013-03-11 ENCOUNTER — Other Ambulatory Visit: Payer: Self-pay | Admitting: *Deleted

## 2013-03-11 ENCOUNTER — Telehealth: Payer: Self-pay | Admitting: Internal Medicine

## 2013-03-11 NOTE — Telephone Encounter (Signed)
Abigail Boyd that I would send this to our [facility] for scheduling. She is agreeable to plan.

## 2013-03-11 NOTE — Telephone Encounter (Signed)
New message        Pt daughter is following up on a sleep study appt. Pt has not received an appt date.

## 2013-03-18 ENCOUNTER — Telehealth: Payer: Self-pay | Admitting: Internal Medicine

## 2013-03-18 NOTE — Telephone Encounter (Signed)
New message  Patient is checking in on sleep study, they still haven't heard anything regarding scheduling. Please call & advise.

## 2013-03-19 ENCOUNTER — Ambulatory Visit (INDEPENDENT_AMBULATORY_CARE_PROVIDER_SITE_OTHER): Payer: Medicare Other | Admitting: Pharmacist Clinician (PhC)/ Clinical Pharmacy Specialist

## 2013-03-19 VITALS — BP 136/60 | HR 80

## 2013-03-19 DIAGNOSIS — I4891 Unspecified atrial fibrillation: Secondary | ICD-10-CM

## 2013-03-19 DIAGNOSIS — I480 Paroxysmal atrial fibrillation: Secondary | ICD-10-CM

## 2013-03-19 DIAGNOSIS — Z7901 Long term (current) use of anticoagulants: Secondary | ICD-10-CM

## 2013-03-19 LAB — POCT INR: INR: 2.8

## 2013-03-19 NOTE — Telephone Encounter (Signed)
Let her know that waiting on Dr. Caryl Comes signature and will hear from [place] to schedule by beginning of next week.  Pt agreeable to plan.

## 2013-04-02 ENCOUNTER — Telehealth: Payer: Self-pay | Admitting: *Deleted

## 2013-04-02 NOTE — Telephone Encounter (Signed)
[facility] faxed patient appt slip. Patient on for 04/16/13.

## 2013-04-03 ENCOUNTER — Telehealth: Payer: Self-pay | Admitting: Cardiology

## 2013-04-03 NOTE — Telephone Encounter (Signed)
Records from [facility] cardiac rehab placed on Dr.Hardings cart per V.O. from Trixie Dredge, R.N. To review and address when he comes into the office this afternoon.

## 2013-04-03 NOTE — Telephone Encounter (Signed)
She has PAF -- hard to Rx tachycardia b/c problems with bradycardia.   As long as she is not overly symptomatic - I am not concerned with HR 110-120.  If she notes persistent HR > 120, can take additional 1/2 tab of Metoprolol x 1.   Leonie Man, MD

## 2013-04-03 NOTE — Telephone Encounter (Signed)
Pt still has an elevated heart rate and they need to know what to do.

## 2013-04-03 NOTE — Telephone Encounter (Signed)
Arbie Cookey, RN w/ [facility] called.  Stated she called earlier and spoke w/ Mariann Laster about pt having increased HR 120 this morning.  Stated it went down before pt left, but she called to check on pt and pt reported HR back in 120s.  Stated pt is on metoprolol.  Informed Arbie Cookey that Dr. Ellyn Hack will be notified and this RN will make sure records on cart as documented for review.  Verbalized understanding.  Message forwarded to Dr. Ellyn Hack.

## 2013-04-03 NOTE — Telephone Encounter (Signed)
Spoke to patient. She understands  instruction - concerning heart rate >120 -TAKE 1/2 tablet metoprolol    Faxed information to [facility]

## 2013-04-16 ENCOUNTER — Ambulatory Visit: Payer: Medicare Other | Admitting: Pharmacist Clinician (PhC)/ Clinical Pharmacy Specialist

## 2013-04-18 ENCOUNTER — Ambulatory Visit: Payer: Self-pay | Admitting: Internal Medicine

## 2013-04-18 ENCOUNTER — Telehealth: Payer: Self-pay | Admitting: *Deleted

## 2013-04-18 NOTE — Telephone Encounter (Signed)
Called pt to see if she was ever scheduled for sleep study - she tells me she had to reschedule due to inclement weather, but is going Sunday 2/22. She will call me to let me know if she has to reschedule again, as we are expecting more weather over the weekend.

## 2013-04-18 NOTE — Telephone Encounter (Signed)
Message copied by Stanton Kidney on Fri Apr 18, 2013  9:17 AM ------      Message from: Stanton Kidney      Created: Tue Mar 11, 2013  6:13 PM      Regarding: Sleep Study       Please arrange sleep study for this pt near [facility].                  thx       :) ------

## 2013-04-21 NOTE — Telephone Encounter (Signed)
Follow up      Sleep study was done last Friday night.

## 2013-04-22 NOTE — Telephone Encounter (Signed)
Pt just wanted [place] to know she had sleep study done on Friday. I explained that we would be in touch once we have the results. Pt agreeable to plan.

## 2013-04-23 ENCOUNTER — Ambulatory Visit: Payer: Medicare Other | Admitting: Pharmacist Clinician (PhC)/ Clinical Pharmacy Specialist

## 2013-04-30 ENCOUNTER — Ambulatory Visit (INDEPENDENT_AMBULATORY_CARE_PROVIDER_SITE_OTHER): Payer: Medicare Other | Admitting: Pharmacist Clinician (PhC)/ Clinical Pharmacy Specialist

## 2013-04-30 VITALS — BP 150/70 | HR 68

## 2013-04-30 DIAGNOSIS — I4891 Unspecified atrial fibrillation: Secondary | ICD-10-CM

## 2013-04-30 DIAGNOSIS — I480 Paroxysmal atrial fibrillation: Secondary | ICD-10-CM

## 2013-04-30 DIAGNOSIS — Z7901 Long term (current) use of anticoagulants: Secondary | ICD-10-CM

## 2013-04-30 LAB — POCT INR: INR: 2

## 2013-05-07 ENCOUNTER — Other Ambulatory Visit: Payer: Self-pay | Admitting: Pharmacist Clinician (PhC)/ Clinical Pharmacy Specialist

## 2013-05-15 ENCOUNTER — Telehealth: Payer: Self-pay | Admitting: Internal Medicine

## 2013-05-15 DIAGNOSIS — G4710 Hypersomnia, unspecified: Secondary | ICD-10-CM

## 2013-05-15 NOTE — Telephone Encounter (Signed)
New message  Patient would like the results of sleep study, please call and advise.

## 2013-05-20 NOTE — Telephone Encounter (Signed)
Called to inform pt of sleep study results and referral placed to Dr. Gwenette Greet, pulmonology. Pt thankful for information and agreeable to plan.

## 2013-06-09 ENCOUNTER — Ambulatory Visit (INDEPENDENT_AMBULATORY_CARE_PROVIDER_SITE_OTHER): Payer: Medicare Other | Admitting: Pharmacist Clinician (PhC)/ Clinical Pharmacy Specialist

## 2013-06-09 DIAGNOSIS — Z7901 Long term (current) use of anticoagulants: Secondary | ICD-10-CM

## 2013-06-09 DIAGNOSIS — I480 Paroxysmal atrial fibrillation: Secondary | ICD-10-CM

## 2013-06-09 DIAGNOSIS — I4891 Unspecified atrial fibrillation: Secondary | ICD-10-CM

## 2013-06-09 LAB — POCT INR: INR: 1.9

## 2013-06-12 ENCOUNTER — Encounter: Payer: Self-pay | Admitting: Pulmonary Disease

## 2013-06-12 ENCOUNTER — Ambulatory Visit (INDEPENDENT_AMBULATORY_CARE_PROVIDER_SITE_OTHER): Payer: Medicare Other | Admitting: Pulmonary Disease

## 2013-06-12 ENCOUNTER — Encounter (INDEPENDENT_AMBULATORY_CARE_PROVIDER_SITE_OTHER): Payer: Self-pay

## 2013-06-12 VITALS — BP 138/62 | HR 66 | Temp 98.6°F | Ht 63.0 in | Wt 169.0 lb

## 2013-06-12 DIAGNOSIS — G4761 Periodic limb movement disorder: Secondary | ICD-10-CM | POA: Insufficient documentation

## 2013-06-12 DIAGNOSIS — G4733 Obstructive sleep apnea (adult) (pediatric): Secondary | ICD-10-CM

## 2013-06-12 MED ORDER — ROPINIROLE HCL 0.5 MG PO TABS: ORAL_TABLET | ORAL | Status: DC

## 2013-06-12 NOTE — Patient Instructions (Signed)
Will try you on requip 0.5mg , one after dinner each night to see if it helps your sleep and daytime alertness. Work on weight reduction We can discuss whether to treat your very mild sleep apnea or not at the next visit. followup with me in 4 weeks.

## 2013-06-12 NOTE — Assessment & Plan Note (Signed)
The patient had moderate numbers of periodic limb movements on her sleep study with significant arousal or awakening. It is unclear how much this is causing her frequent awakenings and symptoms of daytime sleepiness versus her very mild sleep disordered breathing. I would like to try her on a dopamine agonist first and see how she responds.

## 2013-06-12 NOTE — Assessment & Plan Note (Signed)
The patient has very mild obstructive sleep apnea by her recent sleep study, and it is unclear if this has anything to do with her symptoms. Certainly it is not severe enough to result in significant impact to her cardiovascular health, and therefore we are not being pushed to treat this aggressively. I have asked her to work on weight loss, and that we can certainly give her a trial of CPAP if needed. I would like to treat her possible movement disorder first and see how she responds.

## 2013-06-12 NOTE — Progress Notes (Signed)
Subjective:    Patient ID: Abigail Boyd, female    DOB: 12/16/1929, 80 y.o.   MRN: [id number]  HPI The patient is an 80 year old female who I've been asked to see for management of obstructive sleep apnea. She has had a recent sleep study in February of this year which showed very mild OSA, with an AHI of 7 events per hour. She also had moderate numbers of periodic limb movements, with non-per hour resulting in arousal or awakening. The patient states she has been told that she snores, but currently lives alone and is unsure about apneas during sleep. She denies any choking arousals. She has frequent awakenings during the night, and is not rested most mornings upon arising. She is unsure if she has classic RLS symptoms, but she does move her legs a lot in bed when she first tries to go to sleep. This continues until they are "settled"  she does have some inappropriate daytime sleepiness with inactivity, but her sister does not see a lot of this. She has some sleepiness in the evenings with television if it's later in the night. She denies any sleepiness with driving. The patient's weight is up about 10 pounds over the last 2 years, and her Epworth score today is 12.   Sleep Questionnaire What time do you typically go to bed?( Between what hours) 11p 11p at 1152 on 06/12/13 by Virl Cagey, CMA How long does it take you to fall asleep? 40mins-2hours 44mins-2hours at 1152 on 06/12/13 by Virl Cagey, CMA How many times during the night do you wake up? 3 3 at 1152 on 06/12/13 by Virl Cagey, CMA What time do you get out of bed to start your day? No Value 8a-10a at 1152 on 06/12/13 by Virl Cagey, CMA Do you drive or operate heavy machinery in your occupation? No No at 1152 on 06/12/13 by Virl Cagey, CMA How much has your weight changed (up or down) over the past two years? (In pounds) 10 lb (4.536 kg) 10 lb (4.536 kg) at 1152 on 06/12/13 by Virl Cagey, CMA Have you ever had  a sleep study before? Yes Yes at 1152 on 06/12/13 by Virl Cagey, CMA If yes, location of study? [facility]  [facility]  at 6578 on 06/12/13 by Virl Cagey, CMA If yes, date of study? 04/18/13 04/18/13 at 1152 on 06/12/13 by Virl Cagey, CMA Do you currently use CPAP? No No at 1152 on 06/12/13 by Virl Cagey, CMA Do you wear oxygen at any time? No No at 1152 on 06/12/13 by Virl Cagey, CMA   Review of Systems  Constitutional: Negative for fever and unexpected weight change.  HENT: Positive for sneezing. Negative for congestion, dental problem, ear pain, nosebleeds, postnasal drip, rhinorrhea, sinus pressure, sore throat and trouble swallowing.   Eyes: Negative for redness and itching.  Respiratory: Positive for shortness of breath. Negative for cough, chest tightness and wheezing.   Cardiovascular: Negative for palpitations and leg swelling.  Gastrointestinal: Negative for nausea and vomiting.       Acid heart burn  Genitourinary: Negative for dysuria.  Musculoskeletal: Positive for joint swelling.  Skin: Negative for rash (itching).  Neurological: Negative for headaches.  Hematological: Does not bruise/bleed easily.  Psychiatric/Behavioral: Negative for dysphoric mood. The patient is not nervous/anxious.        Objective:   Physical Exam Constitutional:  Overweight female, no acute distress  HENT:  Nares  patent without discharge  Oropharynx without exudate, palate and uvula are mildly elongated.  Eyes:  Perrla, eomi, no scleral icterus  Neck:  No JVD, no TMG  Cardiovascular:  Normal rate, regular rhythm, no rubs or gallops.  No murmurs        Intact distal pulses  Pulmonary :  Normal breath sounds, no stridor or respiratory distress   No rales, rhonchi, or wheezing  Abdominal:  Soft, nondistended, bowel sounds present.  No tenderness noted.   Musculoskeletal:  No significant lower extremity edema noted.  Lymph Nodes:  No cervical  lymphadenopathy noted  Skin:  No cyanosis noted  Neurologic:  Alert, appropriate, moves all 4 extremities without obvious deficit.         Assessment & Plan:

## 2013-06-13 ENCOUNTER — Ambulatory Visit: Payer: Medicare Other | Admitting: Pharmacist Clinician (PhC)/ Clinical Pharmacy Specialist

## 2013-07-08 ENCOUNTER — Encounter: Payer: Self-pay | Admitting: Surgical

## 2013-07-14 ENCOUNTER — Encounter: Payer: Self-pay | Admitting: Pulmonary Disease

## 2013-07-14 ENCOUNTER — Ambulatory Visit (INDEPENDENT_AMBULATORY_CARE_PROVIDER_SITE_OTHER): Payer: Medicare Other | Admitting: Pulmonary Disease

## 2013-07-14 VITALS — BP 120/82 | HR 70 | Temp 97.3°F | Ht 63.0 in | Wt 171.6 lb

## 2013-07-14 DIAGNOSIS — G4733 Obstructive sleep apnea (adult) (pediatric): Secondary | ICD-10-CM

## 2013-07-14 DIAGNOSIS — G4761 Periodic limb movement disorder: Secondary | ICD-10-CM

## 2013-07-14 NOTE — Assessment & Plan Note (Signed)
The patient has minimal obstructive sleep apnea by her recent sleep study, and I do not think this is a contributor to her atrial fibrillation. At this point, the patient wishes to continue working on weight reduction rather than trying CPAP. She will call if she changes her mind.

## 2013-07-14 NOTE — Patient Instructions (Signed)
Continue on the requip for another few months to see if you think it is really helping your leg movements and sleep. Work on weight loss If you decide to treat your sleep apnea with cpap, let me know.

## 2013-07-14 NOTE — Progress Notes (Signed)
   Subjective:    Patient ID: Abigail Boyd, female    DOB: 01/22/1930, 80 y.o.   MRN: [id number]  HPI The patient comes in today for followup of her periodic limb movement disorder, and also very mild OSA. She has been on Requip since last visit, and thinks her limb movements are improved, and that she may be sleeping better. She still has some daytime sleepiness with inactivity however. I have discussed with her again the possibility of trying CPAP for her mild sleep apnea, however she wishes to avoid this at this time.   Review of Systems  Constitutional: Negative for fever and unexpected weight change.  HENT: Positive for postnasal drip, rhinorrhea and sneezing. Negative for congestion, dental problem, ear pain, nosebleeds, sinus pressure, sore throat and trouble swallowing.   Eyes: Negative for redness and itching.  Respiratory: Positive for cough. Negative for chest tightness, shortness of breath and wheezing.   Cardiovascular: Positive for palpitations. Negative for leg swelling.  Gastrointestinal: Negative for nausea and vomiting.  Genitourinary: Negative for dysuria.  Musculoskeletal: Negative for joint swelling.  Skin: Negative for rash.  Neurological: Negative for headaches.  Hematological: Does not bruise/bleed easily.  Psychiatric/Behavioral: Negative for dysphoric mood. The patient is not nervous/anxious.        Objective:   Physical Exam Overweight female in no acute distress Nose without purulence or discharge noted Neck without lymphadenopathy or thyromegaly Lower extremities without significant edema, no cyanosis Alert and oriented, moves all 4 extremities       Assessment & Plan:

## 2013-07-14 NOTE — Assessment & Plan Note (Signed)
The patient thinks that the Requip has helped her limb movements in her sleep, but is not completely sure. I've asked her to continue on this for another few months and try and make the determination whether it is really helping her

## 2013-07-23 ENCOUNTER — Ambulatory Visit (INDEPENDENT_AMBULATORY_CARE_PROVIDER_SITE_OTHER): Payer: Medicare Other | Admitting: Pharmacist Clinician (PhC)/ Clinical Pharmacy Specialist

## 2013-07-23 DIAGNOSIS — I4891 Unspecified atrial fibrillation: Secondary | ICD-10-CM

## 2013-07-23 DIAGNOSIS — Z7901 Long term (current) use of anticoagulants: Secondary | ICD-10-CM

## 2013-07-23 DIAGNOSIS — I480 Paroxysmal atrial fibrillation: Secondary | ICD-10-CM

## 2013-07-23 LAB — POCT INR: INR: 1.6

## 2013-07-28 ENCOUNTER — Encounter: Payer: Self-pay | Admitting: Surgical

## 2013-08-13 ENCOUNTER — Ambulatory Visit: Payer: Medicare Other | Admitting: Pharmacist Clinician (PhC)/ Clinical Pharmacy Specialist

## 2013-08-13 ENCOUNTER — Ambulatory Visit (INDEPENDENT_AMBULATORY_CARE_PROVIDER_SITE_OTHER): Payer: Medicare Other | Admitting: Pharmacist Clinician (PhC)/ Clinical Pharmacy Specialist

## 2013-08-13 DIAGNOSIS — I4891 Unspecified atrial fibrillation: Secondary | ICD-10-CM

## 2013-08-13 DIAGNOSIS — Z7901 Long term (current) use of anticoagulants: Secondary | ICD-10-CM

## 2013-08-13 DIAGNOSIS — I480 Paroxysmal atrial fibrillation: Secondary | ICD-10-CM

## 2013-08-13 LAB — POCT INR: INR: 2

## 2013-08-18 ENCOUNTER — Other Ambulatory Visit: Payer: Self-pay | Admitting: Pharmacist Clinician (PhC)/ Clinical Pharmacy Specialist

## 2013-08-18 MED ORDER — METOPROLOL TARTRATE 25 MG PO TABS: 12.5000 mg | ORAL_TABLET | Freq: Two times a day (BID) | ORAL | Status: DC

## 2013-08-18 NOTE — Telephone Encounter (Signed)
Pt called, asking for refill on metoprolol.  Not on current med list, but listed in last visits with Drs Ellyn Hack and Caryl Comes.  Pt confirmed dose at 12.5mg  bid

## 2013-08-27 ENCOUNTER — Encounter: Payer: Self-pay | Admitting: Surgical

## 2013-08-28 ENCOUNTER — Telehealth: Payer: Self-pay | Admitting: Cardiology

## 2013-08-28 NOTE — Telephone Encounter (Signed)
Abigail Boyd and Abigail Boyd is calling because they are concern because  [name] goes into afib and  they are asking if  we are getting any reports to show that she goes into AFIB after [facility] has checked her . The biggest concern is that she goes into Afib and that we are getting the reports. Please call    Thanks

## 2013-08-28 NOTE — Telephone Encounter (Signed)
[place] - per Ivin Booty she has not received anything from [facility].  The info may be going to the [place] office but no mention in Epic.  Gave fax number and asked to instruct them to send the info to Dr. Ellyn Hack.  Patient is on coumadin.

## 2013-09-10 ENCOUNTER — Ambulatory Visit (INDEPENDENT_AMBULATORY_CARE_PROVIDER_SITE_OTHER): Payer: Medicare Other | Admitting: Pharmacist

## 2013-09-10 DIAGNOSIS — I480 Paroxysmal atrial fibrillation: Secondary | ICD-10-CM

## 2013-09-10 DIAGNOSIS — Z7901 Long term (current) use of anticoagulants: Secondary | ICD-10-CM

## 2013-09-10 DIAGNOSIS — I4891 Unspecified atrial fibrillation: Secondary | ICD-10-CM

## 2013-09-10 LAB — POCT INR: INR: 2.1

## 2013-09-24 ENCOUNTER — Encounter: Payer: Self-pay | Admitting: Cardiovascular Disease

## 2013-10-08 ENCOUNTER — Ambulatory Visit (INDEPENDENT_AMBULATORY_CARE_PROVIDER_SITE_OTHER): Payer: Medicare Other | Admitting: Pharmacist Clinician (PhC)/ Clinical Pharmacy Specialist

## 2013-10-08 DIAGNOSIS — I4891 Unspecified atrial fibrillation: Secondary | ICD-10-CM

## 2013-10-08 DIAGNOSIS — I480 Paroxysmal atrial fibrillation: Secondary | ICD-10-CM

## 2013-10-08 DIAGNOSIS — Z7901 Long term (current) use of anticoagulants: Secondary | ICD-10-CM

## 2013-10-08 LAB — POCT INR: INR: 1.9

## 2013-10-30 ENCOUNTER — Other Ambulatory Visit: Payer: Self-pay

## 2013-10-30 DIAGNOSIS — Z1231 Encounter for screening mammogram for malignant neoplasm of breast: Secondary | ICD-10-CM

## 2013-11-05 ENCOUNTER — Ambulatory Visit (INDEPENDENT_AMBULATORY_CARE_PROVIDER_SITE_OTHER): Payer: Medicare Other | Admitting: Pharmacist Clinician (PhC)/ Clinical Pharmacy Specialist

## 2013-11-05 DIAGNOSIS — I4891 Unspecified atrial fibrillation: Secondary | ICD-10-CM

## 2013-11-05 DIAGNOSIS — Z7901 Long term (current) use of anticoagulants: Secondary | ICD-10-CM

## 2013-11-05 DIAGNOSIS — I480 Paroxysmal atrial fibrillation: Secondary | ICD-10-CM

## 2013-11-05 LAB — POCT INR: INR: 2.1

## 2013-12-01 ENCOUNTER — Ambulatory Visit (INDEPENDENT_AMBULATORY_CARE_PROVIDER_SITE_OTHER): Payer: Medicare Other | Admitting: Pharmacist Clinician (PhC)/ Clinical Pharmacy Specialist

## 2013-12-01 ENCOUNTER — Encounter: Payer: Self-pay | Admitting: Cardiology

## 2013-12-01 ENCOUNTER — Ambulatory Visit (INDEPENDENT_AMBULATORY_CARE_PROVIDER_SITE_OTHER): Payer: Medicare Other | Admitting: Cardiology

## 2013-12-01 VITALS — BP 140/60 | HR 68 | Ht 63.0 in | Wt 169.1 lb

## 2013-12-01 DIAGNOSIS — I200 Unstable angina: Secondary | ICD-10-CM

## 2013-12-01 DIAGNOSIS — Z7901 Long term (current) use of anticoagulants: Secondary | ICD-10-CM

## 2013-12-01 DIAGNOSIS — I495 Sick sinus syndrome: Secondary | ICD-10-CM

## 2013-12-01 DIAGNOSIS — E785 Hyperlipidemia, unspecified: Secondary | ICD-10-CM

## 2013-12-01 DIAGNOSIS — I4589 Other specified conduction disorders: Secondary | ICD-10-CM

## 2013-12-01 DIAGNOSIS — I480 Paroxysmal atrial fibrillation: Secondary | ICD-10-CM

## 2013-12-01 DIAGNOSIS — Z87898 Personal history of other specified conditions: Secondary | ICD-10-CM

## 2013-12-01 DIAGNOSIS — I4891 Unspecified atrial fibrillation: Secondary | ICD-10-CM

## 2013-12-01 DIAGNOSIS — I2510 Atherosclerotic heart disease of native coronary artery without angina pectoris: Secondary | ICD-10-CM

## 2013-12-01 DIAGNOSIS — I10 Essential (primary) hypertension: Secondary | ICD-10-CM

## 2013-12-01 DIAGNOSIS — Z9189 Other specified personal risk factors, not elsewhere classified: Secondary | ICD-10-CM

## 2013-12-01 DIAGNOSIS — Z9861 Coronary angioplasty status: Secondary | ICD-10-CM

## 2013-12-01 LAB — POCT INR: INR: 1.8

## 2013-12-01 NOTE — Patient Instructions (Addendum)
NO CHANGES CURRENT MEDICATIONS  Your physician wants you to follow-up in 6 MONTH Dr Ellyn Hack. [employer] will receive a reminder letter in the mail two months in advance. If you don't receive a letter, please call our office to schedule the follow-up appointment.

## 2013-12-01 NOTE — Progress Notes (Signed)
PCP: Ronald Lobo, MD  Clinic Note: Chief Complaint  Patient presents with  . Follow-up    rov 32yr; shortness of breath on exertion, ankles are sore; concerned about too much sleepiness    HPI: Abigail Boyd is a 80 y.o. female with a cardiac problem list below who presents today for almost one year followup of her CAD and PAS/tachybradycardia. She initially presented with symptoms concerning for unstable angina, evaluated with a Myoview that was positive for ischemia that lead to cardiac catheterization showing an occluded RCA with bridging as well as right to right and left-to-right collaterals. She was also found to have severe disease in OM1 was treated with a Promus DES stent. She was subsequently noted to have atrial fibrillation with tachycardia-bradycardia while evaluating her for some syncope symptoms. There was suggestion of possible association with bradycardia during a syncopal episode, however there was no clarification as to this actually been the case. She saw Dr. Virl Axe from electrophysiology for her difficult to control paroxysmal A. fib with also resting bradycardia. She indeed does have chronotropic incompetence noted on her protocol may stress test, but with reduced in location, she was able to increase her heart rate over 100 beats minute. She was placed back on low-dose beta blocker for rate control. No antiarrhythmics were considered and she was not thought to be a candidate for pacemaker at that time.  1. CAD-PCI OM1, 100% RCA (L-R colllaterals)   2. Unstable angina - history of; resolved, status post PCI of the OM   3. PAF (paroxysmal atrial fibrillation)   4. Tachy-brady syndrome: Rates range from 50-169 bpm in A. fib   5. History of syncope: Unclear etiology   6. Chronotropic incompetence - partially medication related   7. Hyperlipidemia with target LDL less than 70   8. Essential hypertension    Prior Cardiovascular Evaluation and Procedural  History: Procedure Laterality Date  . Cardiac catheterization  07/05/2011    Proximal OM1 lesion --> PCI; 100% mid RCA with right to right and left to right bridging collaterals from circumflex RPL and LAD the PDA.  Marland Kitchen Coronary angioplasty with stent placement  07/05/2011    Promus Element DES 2.5 mm x 16 mm-post dilated to 2.65 mm CX-Proximal OM1  . Nm myoview ltd  October 2014     Low risk. Fixed basal inferior artifact normal EF. No ischemia (as compared to pre-PCI Myoview revealing inferolateral ischemia)   .   Transthoracic Echo October 2013: Normal EF, >55%. No regional WMA, Grd 1 DD,  mildly sclerotic aortic valve without stenosis  Full PMH reviewed in Epic  Interval History: Abigail Boyd presents today feeling relatively fine from a cardiac standpoint. She does get some exertional dyspnea and mild swelling in her ankles. She does get dyspnea with baseline activity, and really doesn't get dyspnea with riding a stationary bicycle. No anginal: Jaw pain with rest or exertion. No PND, orthopnea with maybe trace edema. No further rapid or irregular heartbeat episodes. No further syncope episodes. No TIA or amaurosis fugax. She has noted that she has poor sleep at night and feels tired and reports the day. It's not that she snores loud. She does currently have 2 days a week at [facility].  ROS: A comprehensive was performed. Review of Systems  Constitutional: Positive for malaise/fatigue.       Poor sleeping at night, leading to daytime sleepiness. She is not snoring is just that she tosses and turns a lot and has a hard  time falling asleep.  HENT: Negative for congestion and nosebleeds.   Eyes: Negative for blurred vision.  Respiratory: Positive for shortness of breath. Negative for cough, hemoptysis, sputum production and wheezing.        If she exerts herself significantly with walking. Not while being on the stationary bicycle that she rides on her cardiac rehabilitation days.  Cardiovascular: Negative.   Negative for palpitations and claudication.       Per history of present illness  Gastrointestinal: Negative for blood in stool and melena.  Genitourinary: Negative for hematuria.  Musculoskeletal: Positive for joint pain. Negative for falls.  Neurological: Negative for dizziness, sensory change, speech change, focal weakness, seizures and loss of consciousness.  Endo/Heme/Allergies: Negative.   Psychiatric/Behavioral: Negative for depression. The patient has insomnia. The patient is not nervous/anxious.   All other systems reviewed and are negative.   Current Outpatient Prescriptions on File Prior to Visit  Medication Sig Dispense Refill  . azelastine (ASTELIN) 137 MCG/SPRAY nasal spray Place 1 spray into both nostrils every evening.       . cholecalciferol (VITAMIN D) 1000 UNITS tablet Take 1,000 Units by mouth daily.      Marland Kitchen dexlansoprazole (DEXILANT) 60 MG capsule Take 60 mg by mouth daily.      Marland Kitchen docusate sodium (COLACE) 100 MG capsule Take 100 mg by mouth at bedtime.       . fexofenadine (ALLEGRA) 180 MG tablet Take 180 mg by mouth daily.      . fish oil-omega-3 fatty acids 1000 MG capsule Take 1 g by mouth daily.       Marland Kitchen glimepiride (AMARYL) 2 MG tablet Take 2 mg by mouth daily before breakfast.      . Homeopathic Products (CANTHARIS COMPOSITUM PO) Take by mouth 2 (two) times daily.      Marland Kitchen ipratropium (ATROVENT) 0.06 % nasal spray Place 1 spray into the nose at bedtime.       . metFORMIN (GLUCOPHAGE) 1000 MG tablet Take 1,000 mg by mouth 2 (two) times daily with a meal.      . metoprolol tartrate (LOPRESSOR) 25 MG tablet Take 0.5 tablets (12.5 mg total) by mouth 2 (two) times daily.  60 tablet  5  . nitroGLYCERIN (NITROSTAT) 0.4 MG SL tablet Place 0.4 mg under the tongue every 5 (five) minutes as needed for chest pain.      Marland Kitchen PARoxetine (PAXIL) 10 MG tablet Take 10 mg by mouth daily.       Marland Kitchen rOPINIRole (REQUIP) 0.5 MG tablet Take 1 tablet after dinner each night.  30 tablet  5  .  rosuvastatin (CRESTOR) 20 MG tablet Take 20 mg by mouth at bedtime.       Marland Kitchen warfarin (COUMADIN) 5 MG tablet Take 1 and 1/2 tablets by  mouth daily or as directed  by coumadin clinic  45 tablet  5  . ALPRAZolam (XANAX) 0.25 MG tablet Take 0.25 mg by mouth at bedtime as needed for sleep or anxiety.       Marland Kitchen NEXIUM 40 MG capsule Take 40 mg by mouth daily.       No current facility-administered medications on file prior to visit.    ALLERGIES REVIEWED IN EPIC -- No change SOCIAL AND FAMILY HISTORY REVIEWED IN EPIC -- No change  Wt Readings from Last 3 Encounters:  12/01/13 169 lb 1.6 oz (76.703 kg)  07/14/13 171 lb 9.6 oz (77.837 kg)  06/12/13 169 lb (76.658 kg)    PHYSICAL EXAM  BP 140/60  Pulse 68  Ht 5\' 3"  (1.6 m)  Wt 169 lb 1.6 oz (76.703 kg)  BMI 29.96 kg/m2 General appearance: alert, cooperative, appears stated age, no distress and Very pleasant mood and affect appeared well-nourished, well-groomed.  HEENT: no adenopathy, no carotid bruit, no JVD, supple, symmetrical, trachea midline and thyroid not enlarged, symmetric, no tenderness/mass/nodules; Johnson/AT, EOMI, MMM, anicteric sclera Lungs: CTA B., normal percussion bilaterally and Nonlabored, good air movement  Heart: RRR, S1, S2 normal, no S3 or S4; 1/83medium pitch, C-D SEM at 2nd  RUSB--> carotids  and no rub  Abdomen: soft, non-tender; bowel sounds normal; no masses, no organomegaly  Extremities: extremities normal, atraumatic, no cyanosis or edema, no edema, redness or tenderness in the calves or thighs, no ulcers, gangrene or trophic changes and varicose veins noted  Pulses: 2+ and symmetric  Neurologic: Mental status: Alert, oriented, thought content appropriate, She does have some mild forgetfulness.  Cranial nerves: normal    Adult ECG Report  Rate: 68 ;  Rhythm: normal sinus rhythm  Narrative Interpretation: Otherwise normal EKG  Recent Labs:  Due for labs within the next month or so.   ASSESSMENT / PLAN: Overall  relatively stable. No medication adjustments.  CAD-PCI OM1, 100% RCA (L-R colllaterals) Doing well with her cardiac rehabilitation maintenance course for exercise. She does have dyspnea on exertion but is probably more related to musculoskeletal issues. She is able to ride a bike for half an hour and if symptoms. She is on beta blocker and statin. Has been intolerant of ACE inhibitors and ARB use in the past. With her being on warfarin, and there being some mild bleeding concerns, she is no longer on antiplatelet therapy. So far she has not had any signs of in-stent thrombosis or stenosis. She had a Myoview last year that only showed a fixed basal inferior defect which is probably from occluded RCA (read as artifact). No suggestion of ischemia.  Unstable angina - history of; resolved, status post PCI of the OM She is having difficulty with sleeping but not having any jaw pain and discomfort that was associated with her unstable angina symptoms.  PAF (paroxysmal atrial fibrillation) Rate is currently controlled on low-dose beta blocker. She was seen by Dr. Virl Axe from electrophysiology. They discussed the possibility of pacemaker, the decision was watchful waiting with low-dose beta blocker for rate control. On warfarin for anticoagulation. On Nexium for GI prophylaxis.  Tachy-brady syndrome: Rates range from 50-169 bpm in A. fib No further syncopal episodes, and as far as I can tell no rapid tachycardia episodes. Continue beta blocker at low dose. We talked about potential of using it when necessary dose if her heart rate goes up.  History of syncope: Unclear etiology Dr. Caryl Comes was inclined to think that her episode was neurocardiogenic mediated. Even potentially related to hypotension. No recurrent symptoms.  Chronotropic incompetence - partially medication related This could explain some of her exertional dyspnea, but she did seem to be able to increase her heart rate according to Dr.  Olin Pia evaluation. We'll need to monitor for worsening symptoms. Continue current dose of beta blocker.  Hyperlipidemia with target LDL less than 70 She is currently on Crestor 20 mg. She is due to have her labs checked relatively soon by her PCP. I have not seen no new lipid panel since the increased dose of Crestor.  Essential hypertension With her hypotension related syncope. We are allow for permissive hypertension. Her blood pressures quite fine at the  current level. She is only on low-dose beta blocker. Avoiding diuretics and peripheral vasodilators.    Orders Placed This Encounter  Procedures  . EKG 12-Lead   Meds ordered this encounter  Medications  . DISCONTD: Magnesium 250 MG TABS    Sig: Take 1 tablet by mouth at bedtime.  Marland Kitchen JANUVIA 50 MG tablet    Sig: Take 1 tablet by mouth daily.  . Magnesium 500 MG CAPS    Sig: Take 1 capsule by mouth daily.   Followup: 6 months at [facility] office   Leonie Man, M.D., M.S. Interventional Cardiologist   Pager # [phone]

## 2013-12-02 NOTE — Assessment & Plan Note (Addendum)
Doing well with her cardiac rehabilitation maintenance course for exercise. She does have dyspnea on exertion but is probably more related to musculoskeletal issues. She is able to ride a bike for half an hour and if symptoms. She is on beta blocker and statin. Has been intolerant of ACE inhibitors and ARB use in the past. With her being on warfarin, and there being some mild bleeding concerns, she is no longer on antiplatelet therapy. So far she has not had any signs of in-stent thrombosis or stenosis. She had a Myoview last year that only showed a fixed basal inferior defect which is probably from occluded RCA (read as artifact). No suggestion of ischemia.

## 2013-12-02 NOTE — Assessment & Plan Note (Addendum)
Rate is currently controlled on low-dose beta blocker. She was seen by Dr. Virl Axe from electrophysiology. They discussed the possibility of pacemaker, the decision was watchful waiting with low-dose beta blocker for rate control. On warfarin for anticoagulation. On Nexium for GI prophylaxis.

## 2013-12-02 NOTE — Assessment & Plan Note (Signed)
She is having difficulty with sleeping but not having any jaw pain and discomfort that was associated with her unstable angina symptoms.

## 2013-12-03 ENCOUNTER — Other Ambulatory Visit: Payer: Self-pay | Admitting: Pulmonary Disease

## 2013-12-03 ENCOUNTER — Encounter: Payer: Self-pay | Admitting: Cardiology

## 2013-12-03 NOTE — Assessment & Plan Note (Signed)
She is currently on Crestor 20 mg. She is due to have her labs checked relatively soon by her PCP. I have not seen no new lipid panel since the increased dose of Crestor.

## 2013-12-03 NOTE — Assessment & Plan Note (Signed)
No further syncopal episodes, and as far as I can tell no rapid tachycardia episodes. Continue beta blocker at low dose. We talked about potential of using it when necessary dose if her heart rate goes up.

## 2013-12-03 NOTE — Progress Notes (Signed)
She will be following up at the [place] office.

## 2013-12-03 NOTE — Assessment & Plan Note (Signed)
This could explain some of her exertional dyspnea, but she did seem to be able to increase her heart rate according to Dr. Olin Pia evaluation. We'll need to monitor for worsening symptoms. Continue current dose of beta blocker.

## 2013-12-03 NOTE — Assessment & Plan Note (Signed)
With her hypotension related syncope. We are allow for permissive hypertension. Her blood pressures quite fine at the current level. She is only on low-dose beta blocker. Avoiding diuretics and peripheral vasodilators.

## 2013-12-03 NOTE — Assessment & Plan Note (Signed)
Dr. Caryl Comes was inclined to think that her episode was neurocardiogenic mediated. Even potentially related to hypotension. No recurrent symptoms.

## 2013-12-08 ENCOUNTER — Ambulatory Visit
Admission: RE | Admit: 2013-12-08 | Discharge: 2013-12-08 | Disposition: A | Discharge status: Home / Self Care | Payer: Medicare Other | Source: Ambulatory Visit

## 2013-12-08 DIAGNOSIS — Z1231 Encounter for screening mammogram for malignant neoplasm of breast: Secondary | ICD-10-CM

## 2013-12-24 ENCOUNTER — Ambulatory Visit (INDEPENDENT_AMBULATORY_CARE_PROVIDER_SITE_OTHER): Payer: Medicare Other

## 2013-12-24 DIAGNOSIS — I4891 Unspecified atrial fibrillation: Secondary | ICD-10-CM

## 2013-12-24 DIAGNOSIS — Z7901 Long term (current) use of anticoagulants: Secondary | ICD-10-CM

## 2013-12-24 DIAGNOSIS — I480 Paroxysmal atrial fibrillation: Secondary | ICD-10-CM

## 2013-12-24 LAB — POCT INR: INR: 2.5

## 2014-01-14 ENCOUNTER — Telehealth: Payer: Self-pay

## 2014-01-14 ENCOUNTER — Ambulatory Visit (INDEPENDENT_AMBULATORY_CARE_PROVIDER_SITE_OTHER): Payer: Medicare Other

## 2014-01-14 DIAGNOSIS — Z7901 Long term (current) use of anticoagulants: Secondary | ICD-10-CM

## 2014-01-14 DIAGNOSIS — I480 Paroxysmal atrial fibrillation: Secondary | ICD-10-CM

## 2014-01-14 DIAGNOSIS — I4891 Unspecified atrial fibrillation: Secondary | ICD-10-CM

## 2014-01-14 LAB — POCT INR: INR: 1.7

## 2014-01-14 NOTE — Telephone Encounter (Signed)
Okay,  we talked about this last month, She has atrial fibrillation and we are concerned about rapid rate because she has had rapid rate response. She may indeed have fatigue and beta blocker may be related but not to the point of making her very sleepy.  Just to be sure, we can hold the morning dose of metoprolol or the next 2 weeks, and see how she does. If her fatigue improves, then that may be what is. We then will need to consider what we would do if she goes back into atrial fibrillation.  For that reason I am reluctant to fully stop beta blocker, but after these 2 weeks, if the fatigue is still not better, stop the nighttime dose for another 2 weeks and reassess.  Abigail Boyd

## 2014-01-14 NOTE — Telephone Encounter (Signed)
Pt seen in Coumadin clinic today.  Pt reports increased weakness and fatigue.  Per daughter's report pt is spending much of the day sleeping.  Becomes tired at beginning of day after preforming minimal ADL.  Pt has been unable to attend cardiac rehab recently secondary to knee pain, seeing ortho received cortisone shot this week with minimal improvement.  Pt's daughter concerned with pt's inactivity and excessive daytime sleepiness.  Pt's daughter questioning if related to low dose beta blocker therapy.  Explained could be multifactorial, related to dx of afib itself, beta blocker therapy, and/or sedentary lifestyle at present. Pt's daughter questioning if medications need changing or if pt needs sooner appt to be seen and evaluated.  Pt saw Dr Ellyn Hack recently on 12/01/13.  Please advise.  Thanks

## 2014-01-16 NOTE — Telephone Encounter (Signed)
Informed patient of Dr. Darcus Pester recommendations. Patient stated she will hold morning metoprolol for 2 weeks and call us with an update

## 2014-01-22 ENCOUNTER — Other Ambulatory Visit: Payer: Self-pay | Admitting: Pharmacist Clinician (PhC)/ Clinical Pharmacy Specialist

## 2014-01-28 ENCOUNTER — Ambulatory Visit (INDEPENDENT_AMBULATORY_CARE_PROVIDER_SITE_OTHER): Payer: Medicare Other

## 2014-01-28 DIAGNOSIS — Z7901 Long term (current) use of anticoagulants: Secondary | ICD-10-CM

## 2014-01-28 DIAGNOSIS — I480 Paroxysmal atrial fibrillation: Secondary | ICD-10-CM

## 2014-01-28 DIAGNOSIS — I4891 Unspecified atrial fibrillation: Secondary | ICD-10-CM

## 2014-01-28 LAB — POCT INR: INR: 1.8

## 2014-01-28 NOTE — Telephone Encounter (Signed)
Her daughter called because she is having an intermittent deep cough. She has had a few runs of atrial fibrillation that are currently resolved and she is currently in sinus rhythm. I advised to come ER if any progression of symptoms. She is not passing out and has no chest pain. She will try to get an appointment to see her PCP soon (? Tomorrow) regarding this deep cough.

## 2014-02-05 ENCOUNTER — Encounter (HOSPITAL_COMMUNITY): Payer: Self-pay | Admitting: Cardiology

## 2014-02-12 ENCOUNTER — Other Ambulatory Visit: Payer: Medicare Other

## 2014-02-12 ENCOUNTER — Ambulatory Visit (INDEPENDENT_AMBULATORY_CARE_PROVIDER_SITE_OTHER): Payer: Medicare Other

## 2014-02-12 DIAGNOSIS — Z7901 Long term (current) use of anticoagulants: Secondary | ICD-10-CM

## 2014-02-12 DIAGNOSIS — I480 Paroxysmal atrial fibrillation: Secondary | ICD-10-CM

## 2014-02-12 DIAGNOSIS — I4891 Unspecified atrial fibrillation: Secondary | ICD-10-CM

## 2014-02-12 LAB — POCT INR: INR: 1.8

## 2014-02-25 ENCOUNTER — Ambulatory Visit (INDEPENDENT_AMBULATORY_CARE_PROVIDER_SITE_OTHER): Payer: Medicare Other

## 2014-02-25 DIAGNOSIS — Z7901 Long term (current) use of anticoagulants: Secondary | ICD-10-CM

## 2014-02-25 DIAGNOSIS — I4891 Unspecified atrial fibrillation: Secondary | ICD-10-CM

## 2014-02-25 DIAGNOSIS — I480 Paroxysmal atrial fibrillation: Secondary | ICD-10-CM

## 2014-02-25 LAB — POCT INR: INR: 2.2

## 2014-03-25 ENCOUNTER — Ambulatory Visit (INDEPENDENT_AMBULATORY_CARE_PROVIDER_SITE_OTHER): Payer: Medicare Other | Admitting: Pharmacist

## 2014-03-25 DIAGNOSIS — I4891 Unspecified atrial fibrillation: Secondary | ICD-10-CM

## 2014-03-25 DIAGNOSIS — I480 Paroxysmal atrial fibrillation: Secondary | ICD-10-CM

## 2014-03-25 DIAGNOSIS — Z7901 Long term (current) use of anticoagulants: Secondary | ICD-10-CM

## 2014-03-25 LAB — POCT INR: INR: 2.1

## 2014-03-28 ENCOUNTER — Other Ambulatory Visit: Payer: Self-pay | Admitting: Pulmonary Disease

## 2014-04-22 ENCOUNTER — Ambulatory Visit (INDEPENDENT_AMBULATORY_CARE_PROVIDER_SITE_OTHER): Payer: Medicare Other

## 2014-04-22 DIAGNOSIS — Z7901 Long term (current) use of anticoagulants: Secondary | ICD-10-CM

## 2014-04-22 DIAGNOSIS — I480 Paroxysmal atrial fibrillation: Secondary | ICD-10-CM

## 2014-04-22 DIAGNOSIS — I4891 Unspecified atrial fibrillation: Secondary | ICD-10-CM

## 2014-04-22 LAB — POCT INR: INR: 3.5

## 2014-05-06 ENCOUNTER — Ambulatory Visit (INDEPENDENT_AMBULATORY_CARE_PROVIDER_SITE_OTHER): Payer: Medicare Other

## 2014-05-06 DIAGNOSIS — I4891 Unspecified atrial fibrillation: Secondary | ICD-10-CM

## 2014-05-06 DIAGNOSIS — Z7901 Long term (current) use of anticoagulants: Secondary | ICD-10-CM

## 2014-05-06 DIAGNOSIS — I480 Paroxysmal atrial fibrillation: Secondary | ICD-10-CM

## 2014-05-06 LAB — POCT INR: INR: 4.5

## 2014-05-08 ENCOUNTER — Other Ambulatory Visit: Payer: Self-pay | Admitting: Pharmacist Clinician (PhC)/ Clinical Pharmacy Specialist

## 2014-05-08 MED ORDER — WARFARIN SODIUM 5 MG PO TABS: ORAL_TABLET | ORAL | Status: DC

## 2014-05-20 ENCOUNTER — Telehealth: Payer: Self-pay | Admitting: Cardiology

## 2014-05-20 ENCOUNTER — Ambulatory Visit (INDEPENDENT_AMBULATORY_CARE_PROVIDER_SITE_OTHER): Payer: Medicare Other

## 2014-05-20 DIAGNOSIS — I4891 Unspecified atrial fibrillation: Secondary | ICD-10-CM | POA: Diagnosis not present

## 2014-05-20 DIAGNOSIS — I480 Paroxysmal atrial fibrillation: Secondary | ICD-10-CM | POA: Diagnosis not present

## 2014-05-20 DIAGNOSIS — Z7901 Long term (current) use of anticoagulants: Secondary | ICD-10-CM

## 2014-05-20 LAB — POCT INR: INR: 2.2

## 2014-05-20 NOTE — Telephone Encounter (Signed)
Patient needs Clearance for Orthopedic Surgical Clearance.  Send to [facility].  Call patient when sent.

## 2014-05-22 NOTE — Telephone Encounter (Signed)
I have not seen her since October. She was doing well then probably would not need any further evaluation. Unfortunately, since I don't know how she is doing in the interim, I cannot truthfully comment.  If she is not having symptoms, she has had a relatively recent stress test and probably would not need a stress test. It would just be nice to have her evaluated to make sure that she is not actually having symptoms of angina.  Pretty much with the low risk for low-risk surgery, but if they feel that she needs to be seen she needed to come in to see somebody prior to her surgery which is fine. I don't think she needs a stress test.  If we talk with her, to find out if she is having any chest pain with rest or exertion or heart failure symptoms. I think we could probably go straight through for surgery. Would be okay for her to stop warfarin.  [place]

## 2014-05-22 NOTE — Telephone Encounter (Signed)
LVM 3/25

## 2014-05-25 NOTE — Telephone Encounter (Signed)
Pt returning our call °Please call back ° °

## 2014-05-27 NOTE — Telephone Encounter (Signed)
Patient stated she has not any cardiac symptoms since her last visit

## 2014-06-01 NOTE — Telephone Encounter (Signed)
Low Risk for Low Risk Surgery - OM to hold warfarin (per prior note)  Thanks

## 2014-06-04 NOTE — Telephone Encounter (Signed)
Spoke w/ pt.  Advised her of Dr. Allison Quarry recommendation.  She reports that Dr. Gladstone Lighter @ [facility] will be performing her surgery.  She verbalizes understanding, but states that she has "not gotten the ok from [place]" to hold her coumadin.  She would appreciate a call to go over any instructions or recommendations that she may have.

## 2014-06-05 NOTE — Telephone Encounter (Signed)
Called spoke with pt, advised OK per DR Ellyn Hack to proceed with surgery and hold Warfarin.  Pt states she does not have a date yet for surgery.  Advised pt when she gets surgery date call us and we will give her detailed instructions for holding Warfarin prior to surgery.  Pt has f/u appt in Coumadin clinic and with Dr Ellyn Hack next week on 06/10/14.

## 2014-06-10 ENCOUNTER — Ambulatory Visit (INDEPENDENT_AMBULATORY_CARE_PROVIDER_SITE_OTHER): Payer: Medicare Other

## 2014-06-10 ENCOUNTER — Encounter: Payer: Self-pay | Admitting: Cardiology

## 2014-06-10 ENCOUNTER — Ambulatory Visit (INDEPENDENT_AMBULATORY_CARE_PROVIDER_SITE_OTHER): Payer: Medicare Other | Admitting: Cardiology

## 2014-06-10 VITALS — BP 137/73 | HR 68 | Ht 63.0 in | Wt 168.5 lb

## 2014-06-10 DIAGNOSIS — I480 Paroxysmal atrial fibrillation: Secondary | ICD-10-CM

## 2014-06-10 DIAGNOSIS — I495 Sick sinus syndrome: Secondary | ICD-10-CM | POA: Diagnosis not present

## 2014-06-10 DIAGNOSIS — I4891 Unspecified atrial fibrillation: Secondary | ICD-10-CM | POA: Diagnosis not present

## 2014-06-10 DIAGNOSIS — I10 Essential (primary) hypertension: Secondary | ICD-10-CM

## 2014-06-10 DIAGNOSIS — I2510 Atherosclerotic heart disease of native coronary artery without angina pectoris: Secondary | ICD-10-CM

## 2014-06-10 DIAGNOSIS — Z7901 Long term (current) use of anticoagulants: Secondary | ICD-10-CM | POA: Diagnosis not present

## 2014-06-10 DIAGNOSIS — E785 Hyperlipidemia, unspecified: Secondary | ICD-10-CM

## 2014-06-10 DIAGNOSIS — Z7189 Other specified counseling: Secondary | ICD-10-CM | POA: Insufficient documentation

## 2014-06-10 DIAGNOSIS — I4589 Other specified conduction disorders: Secondary | ICD-10-CM

## 2014-06-10 DIAGNOSIS — Z01810 Encounter for preprocedural cardiovascular examination: Secondary | ICD-10-CM | POA: Diagnosis not present

## 2014-06-10 DIAGNOSIS — Z9189 Other specified personal risk factors, not elsewhere classified: Secondary | ICD-10-CM

## 2014-06-10 DIAGNOSIS — Z9861 Coronary angioplasty status: Secondary | ICD-10-CM

## 2014-06-10 DIAGNOSIS — I200 Unstable angina: Secondary | ICD-10-CM

## 2014-06-10 DIAGNOSIS — Z87898 Personal history of other specified conditions: Secondary | ICD-10-CM

## 2014-06-10 LAB — POCT INR: INR: 2.3

## 2014-06-10 NOTE — Assessment & Plan Note (Signed)
She is actually relatively healthy-appearing 81 year old woman with coronary disease and has been revascularized, no heart diarrhea, she has diabetes but not on insulin and has normal renal function. No prior stroke. The planned surgery is low risk.  Perioperative risk assessment was as follows:  PREOPERATIVE CARDIAC RISK ASSESSMENT   Revised Cardiac Risk Index:  High Risk Surgery: no; Below the hip orthopedic surgery  Defined as Intraperitoneal, intrathoracic or suprainguinal vascular  Active CAD: no; no active angina symptoms  CHF: no; has not had CHF symptoms  Cerebrovascular Disease: no; no history  Diabetes: yes; On Insulin: no - not considered at high risk  CKD (Cr >~ 2): no;   Total: 0.5 Estimated Risk of Adverse Outcome: LOW RISK FOR LOW RISK PATIENT - she does have PAF, so per-op Afib is possible -- continue Beta Blocker.  Monitor on Telemetry.  If AFib occurs - [facility] Consultation Service will be happy to assist.  Estimated Risk of MI, PE, VF/VT (Cardiac Arrest), Complete Heart Block: <1 %, obviously risk of Afib is higher (cannot easily estimate).  ACC/AHA Guidelines for "Clearance":  Step 1 - Need for Emergency Surgery: No:   If Yes - go straight to OR with perioperative surveillance  Step 2 - Active Cardiac Conditions (Unstable Angina, Decompensated HF, Significant  Arrhytmias - Complete HB, Mobitz II, Symptomatic VT or SVT, Severe Aortic Stenosis - mean gradient > 40 mmHg, Valve area < 1.0 cm2):   No:   Step 3 -  Low Risk Surgery: Yes  If Yes --> proceed to OR  Recommendation: Proceed OR without further cardiac evaluation. Continue beta blocker perioperatively but monitor for A. Fib or bradycardia. Continue statin perioperatively. Okay to hold warfarin 5 days preoperatively and restart once the postop.  No need to bridge. -- contact Coumadin clinic on stop & restart date to arrange INR follow-up post-op.

## 2014-06-10 NOTE — Assessment & Plan Note (Signed)
We are allowing for is of hypertension because of her neurocardiogenic syncope. Only on low-dose beta blocker. A voiding diuretics and peripheral vasodilators.

## 2014-06-10 NOTE — Assessment & Plan Note (Signed)
Overall doing well with no recurrent anginal symptoms or heart failure symptoms. Dyspnea on exertion has improved or had improved prior to her injury. No active anginal symptoms. She does have CAD but is revascularized adequately with stented OM and collaterals to the right. Would not recommend ischemic evaluation in the absence of symptoms. She is on low doses of walking which is about as much she can handle with her history of bradycardia. She should not stop [place] perioperatively. Has not tolerated ACE inhibitor/ARB in the past. She is on warfarin and therefore, aspirin. She is on Crestor and tolerating it well.

## 2014-06-10 NOTE — Assessment & Plan Note (Signed)
On Crestor - labs followed by PCP. 

## 2014-06-10 NOTE — Assessment & Plan Note (Signed)
No recurrent symptoms consistent with angina. Her anginal equivalent was paced and jaw pain. No symptoms since her PCI. She does have when necessary nitroglycerin.

## 2014-06-10 NOTE — Assessment & Plan Note (Signed)
No recurrent episodes. This was thought to be neurocardiogenic mediated and potentially vagally mediated. No recurrent episodes. Monitor.

## 2014-06-10 NOTE — Assessment & Plan Note (Signed)
On the tolerated the beta blocker. Would need to monitor on telemetry perioperatively.

## 2014-06-10 NOTE — Assessment & Plan Note (Addendum)
On treadmill stress test, she was able to adequately increase her heart rate with a reduced dose of beta blocker. Unfortunately, with underlying atrial fibrillation, she needs some AV nodal agent on board and beta blockers are best suited for her CAD.

## 2014-06-10 NOTE — Assessment & Plan Note (Signed)
Currently rate controlled on low-dose beta blocker. With history of chronotropic incompetence, I don't want to increase that to much.  Plan for now as watchful waiting to determine if she will need eventually require pacemaker placement but currently not considered to be a candidate. On warfarin for anticoagulation without significant bleeding issues. This can be stopped 5 days preoperatively. On PPI for GI prophylaxis

## 2014-06-10 NOTE — Progress Notes (Signed)
PCP: Morton Peters, MD  Clinic Note: Chief Complaint  Patient presents with  . other    C/o sob, fatigue, leg pain and burning when urinating . Meds reviewed verbally with pt.  . Pre-op Exam  . Coronary Artery Disease  . Atrial Fibrillation    HPI: Abigail Boyd is a 81 y.o. female with a PMH below who presents today for routine followup visit for her CAD and PAF. This is also for a preoperative risk assessment. She had a history of unstable angina back in 2013 underwent PCI to an OM with stent does also have an occluded RCA with collaterals. She suddenly developed atrial fibrillation with mild tachybradycardia syndrome. She has had at least one syncopal event they were not sure the true etiology. She was seen by Dr. Caryl Comes from electrophysiology who felt that she did not meet criteria for pacemaker.   She apparently injured her right knee, about a month ago and has had evaluation showing torn meniscus. I have been asked to see her for preoperative risk assessment.  Past Medical History  Diagnosis Date  . History of unstable angina 06/13/2011    Jaw pain awakening from sleep -- Myoview --CATH --> PCI  . Abnormal nuclear stress test 06/21/2011    Inferolateral reversible defect;--> cardiac cath & PCI of CxOM, occluded RCA with collaterals;; followup Myoview 11/2012: Low risk. Fixed basal inferior artifact normal EF. No ischemia  . CAD S/P percutaneous coronary angioplasty     s/P PCI to proximal OM1 w/ DES; occluded RCA with bridging and L-R collaterals (medical management)  . Shortness of breath on exertion October 2013    2-D echo: Normal EF>55%, Gr 1DD, mild aortic sclerosis; Evaluated with CPET - peak VO2 97%; Chronotropic Incompetence (submaximal effort)  . Chronotropic incompetence with sinus node dysfunction October 2013    On CPET test; beta blockers reduced  . PAF (paroxysmal atrial fibrillation) 10-11/ 2014    CardioNet Event Monitor: NSR & S Brady -- Rates 50-100;  Total A. fib burden 35 hours and 27 minutes. 1296 episodes, longest was 1 hour 29 minutes. Rate ranged from 52-169 beats per minute.  . Tachycardia-bradycardia syndrome 11/2012  . History of syncope     Per EP - neurocardiogenic & not Bradycardia related (no PPM)  . OSA (obstructive sleep apnea)     hx bladder infections  . Diabetes mellitus type 2, controlled     On oral medications  . Essential hypertension     Allowing for permissive hypertension to avoid orthostatic hypotension  . Hyperlipidemia with target LDL less than 70     HDL at goal, LDL not at goal, borderline triglycerides. On Crestor 20 mg  . Chronic kidney disease (CKD), stage II (mild)     Related to current bladder infections (although diabetes cannot be excluded)  . Spinal stenosis of lumbar region 11/2011  . Osteoarthritis   . GERD (gastroesophageal reflux disease)     On PPI  . Seasonal allergies   . Anxiety     When necessary Xanax  . Glaucoma   . Bilateral cataracts     Status post stroke or correction    Prior Cardiac Evaluation and Past Surgical History: Past Surgical History  Procedure Laterality Date  . Cataracts      both eyes  . Colonoscopy    . Vaginal hysterectomy    . Breast biopsy      both breast  . Cardiac catheterization  07/05/2011  Unstable Angina --> Myoview wiht Inf-Lat Ischemia.  Proximal OM1 lesion --> PCI; 100% mid RCA with right to right and left to right bridging collaterals from circumflex RPL and LAD the PDA.  Marland Kitchen Coronary angioplasty with stent placement  07/05/2011    Promus Element DES 2.5 mm x 16 mm-post dilated to 2.65 mm CX-Proximal OM1  . Nm myoview ltd  October 2014     Low risk. Fixed basal inferior artifact normal EF. No ischemia --> (as compared to pre-PCI Myoview revealing inferolateral ischemia)  . Transthoracic echocardiogram  October 2013    Normal EF, >55%. No regional WMA, Grd 1 DD,  mildly sclerotic aortic valve without stenosis  . Left heart catheterization with  coronary angiogram N/A 07/06/2011    Procedure: LEFT HEART CATHETERIZATION WITH CORONARY ANGIOGRAM;  Surgeon: Leonie Man, MD;  Location: [facility] CATH LAB;  Service: Cardiovascular;  Laterality: N/A;    Interval History: the time of her injury, she was continued to bring well with her maintenance cardiac rehabilitation. She was not having any significant sensations in angina or dyspnea at rest or exertion. She has not had any recurrent episodes of syncope or near-syncope. No rapid or irregular heartbeats or a sensation of dizziness or wooziness. She does feel tired quite a bit of the day but is having a hard time sleeping because of pain in her hip and knee. She has mild edema but no PND orthopnea. She denies any symptoms of syncope/near syncope or TIA/amaurosis fugax symptoms. No melena, hematochezia, hematuria, or epstaxis. No claudication. Prior to having her knee injury she was able to achieve at least 6-7 METs without symptoms.    ROS: A comprehensive was performed. Review of Systems  Constitutional: Negative for malaise/fatigue.  HENT: Negative for nosebleeds.   Eyes: Negative for blurred vision.  Respiratory: Negative for cough, shortness of breath and wheezing.   Cardiovascular: Positive for leg swelling (only mild R ankle & LE swelling below the injured knee). Negative for chest pain.  Gastrointestinal: Negative for blood in stool and melena.  Genitourinary: Negative for hematuria.  Musculoskeletal: Positive for joint pain (R knee & L hip).  Neurological: Negative for weakness and headaches.  Endo/Heme/Allergies: Does not bruise/bleed easily.  Psychiatric/Behavioral:       Tired / sleepy during the day b/c unable to sleep through the night - most likely related to pain  All other systems reviewed and are negative.   Current Outpatient Prescriptions on File Prior to Visit  Medication Sig Dispense Refill  . ALPRAZolam (XANAX) 0.25 MG tablet Take 0.25 mg by mouth at bedtime as needed  for sleep or anxiety.     Marland Kitchen azelastine (ASTELIN) 137 MCG/SPRAY nasal spray Place 1 spray into both nostrils every evening.     . cholecalciferol (VITAMIN D) 1000 UNITS tablet Take 1,000 Units by mouth daily.    Marland Kitchen dexlansoprazole (DEXILANT) 60 MG capsule Take 60 mg by mouth daily.    Marland Kitchen docusate sodium (COLACE) 100 MG capsule Take 100 mg by mouth at bedtime.     . fexofenadine (ALLEGRA) 180 MG tablet Take 180 mg by mouth daily.    . fish oil-omega-3 fatty acids 1000 MG capsule Take 1 g by mouth daily.     Marland Kitchen glimepiride (AMARYL) 2 MG tablet Take 2 mg by mouth daily before breakfast.    . Homeopathic Products (CANTHARIS COMPOSITUM PO) Take by mouth 2 (two) times daily.    Marland Kitchen ipratropium (ATROVENT) 0.06 % nasal spray Place 1 spray into  the nose at bedtime.     Marland Kitchen JANUVIA 50 MG tablet Take 1 tablet by mouth daily.    . Magnesium 500 MG CAPS Take 1 capsule by mouth daily.    . metFORMIN (GLUCOPHAGE) 1000 MG tablet Take 1,000 mg by mouth 2 (two) times daily with a meal.    . metoprolol tartrate (LOPRESSOR) 25 MG tablet Take 0.5 tablets (12.5 mg total) by mouth 2 (two) times daily. 60 tablet 5  . NEXIUM 40 MG capsule Take 40 mg by mouth daily.    . nitroGLYCERIN (NITROSTAT) 0.4 MG SL tablet Place 0.4 mg under the tongue every 5 (five) minutes as needed for chest pain.    Marland Kitchen PARoxetine (PAXIL) 10 MG tablet Take 10 mg by mouth daily.     Marland Kitchen rOPINIRole (REQUIP) 0.5 MG tablet TAKE 1 TABLET AFTER DINNER EACH NIGHT. 30 tablet 3  . rosuvastatin (CRESTOR) 20 MG tablet Take 20 mg by mouth at bedtime.     Marland Kitchen warfarin (COUMADIN) 5 MG tablet Take 1 to 1.5 tablets by mouth daily as directed by coumadin clinic 120 tablet 1   No current facility-administered medications on file prior to visit.   Allergies  Allergen Reactions  . Lisinopril Cough  . Losartan Cough  . Sulfa Antibiotics Hives     History  Substance Use Topics  . Smoking status: Never Smoker   . Smokeless tobacco: Never Used  . Alcohol Use: No    Family History  Problem Relation Age of Onset  . Colon cancer Neg Hx   . Esophageal cancer Neg Hx   . Stomach cancer Neg Hx   . COPD Mother   . Heart attack Father   . Heart disease Father   . Cancer Sister   . Cancer Sister   . Pulmonary fibrosis Mother   . Clotting disorder Sister   . Heart attack Sister   . Stroke Father      Wt Readings from Last 3 Encounters:  06/10/14 168 lb 8 oz (76.431 kg)  12/01/13 169 lb 1.6 oz (76.703 kg)  07/14/13 171 lb 9.6 oz (77.837 kg)   PHYSICAL EXAM BP 137/73 mmHg  Pulse 68  Ht 5\' 3"  (1.6 m)  Wt 168 lb 8 oz (76.431 kg)  BMI 29.86 kg/m2 General appearance: alert, cooperative, appears stated age, no distress and Very pleasant mood and affect appeared well-nourished, well-groomed.  HEENT: no adenopathy, no carotid bruit, no JVD, supple, symmetrical, trachea midline and thyroid not enlarged, symmetric, no tenderness/mass/nodules; [place]/AT, EOMI, MMM, anicteric sclera Lungs: CTA B., normal percussion bilaterally and Nonlabored, good air movement  Heart: RRR, S1, S2 normal, no S3 or S4; 1/73medium pitch, C-D SEM at 2nd RUSB--> carotids and no rub  Abdomen: soft, non-tender; bowel sounds normal; no masses, no organomegaly  Extremities: extremities normal, atraumatic, no cyanosis or edema, no edema, redness or tenderness in the calves or thighs, no ulcers, gangrene or trophic changes and varicose veins noted  Pulses: 2+ and symmetric  Neurologic: Mental status: Alert, oriented, thought content appropriate, She does have some mild forgetfulness.  Cranial nerves: normal    Adult ECG Report  Rate: 68 ;  Rhythm: normal sinus rhythm and RSR' in precordial leads - non-specific finding.  Axis, intervals and durations.  Narrative Interpretation: normal EKG  Recent Labs:    Lab Results  Component Value Date   INR 2.3 06/10/2014   INR 2.2 05/20/2014   INR 4.5 05/06/2014   Lab Results  Component Value Date  CREATININE 0.97 12/20/2012     ASSESSMENT / PLAN: Problem List Items Addressed This Visit    CAD-PCI OM1, 100% RCA (L-R colllaterals) (Chronic)    Overall doing well with no recurrent anginal symptoms or heart failure symptoms. Dyspnea on exertion has improved or had improved prior to her injury. No active anginal symptoms. She does have CAD but is revascularized adequately with stented OM and collaterals to the right. Would not recommend ischemic evaluation in the absence of symptoms. She is on low doses of walking which is about as much she can handle with her history of bradycardia. She should not stop [place] perioperatively. Has not tolerated ACE inhibitor/ARB in the past. She is on warfarin and therefore, aspirin. She is on Crestor and tolerating it well.      Chronotropic incompetence - partially medication related (Chronic)    On treadmill stress test, she was able to adequately increase her heart rate with a reduced dose of beta blocker. Unfortunately, with underlying atrial fibrillation, she needs some AV nodal agent on board and beta blockers are best suited for her CAD.      Essential hypertension (Chronic)    We are allowing for is of hypertension because of her neurocardiogenic syncope. Only on low-dose beta blocker. A voiding diuretics and peripheral vasodilators.      History of syncope: Unclear etiology (Chronic)    No recurrent episodes. This was thought to be neurocardiogenic mediated and potentially vagally mediated. No recurrent episodes. Monitor.      Hyperlipidemia with target LDL less than 70 (Chronic)    On Crestor.labs followed by PCP      PAF (paroxysmal atrial fibrillation) (Chronic)    Currently rate controlled on low-dose beta blocker. With history of chronotropic incompetence, I don't want to increase that to much.  Plan for now as watchful waiting to determine if she will need eventually require pacemaker placement but currently not considered to be a candidate. On warfarin for  anticoagulation without significant bleeding issues. This can be stopped 5 days preoperatively. On PPI for GI prophylaxis      Relevant Orders   EKG 12-Lead (Completed)   Preop cardiovascular exam - Primary    She is actually relatively healthy-appearing 81 year old woman with coronary disease and has been revascularized, no heart diarrhea, she has diabetes but not on insulin and has normal renal function. No prior stroke. The planned surgery is low risk.  Perioperative risk assessment was as follows:  PREOPERATIVE CARDIAC RISK ASSESSMENT   Revised Cardiac Risk Index:  High Risk Surgery: no; Below the hip orthopedic surgery  Defined as Intraperitoneal, intrathoracic or suprainguinal vascular  Active CAD: no; no active angina symptoms  CHF: no; has not had CHF symptoms  Cerebrovascular Disease: no; no history  Diabetes: yes; On Insulin: no - not considered at high risk  CKD (Cr >~ 2): no;   Total: 0.5 Estimated Risk of Adverse Outcome: LOW RISK FOR LOW RISK PATIENT - she does have PAF, so per-op Afib is possible -- continue Beta Blocker.  Monitor on Telemetry.  If AFib occurs - [facility] Consultation Service will be happy to assist.  Estimated Risk of MI, PE, VF/VT (Cardiac Arrest), Complete Heart Block: <1 %, obviously risk of Afib is higher (cannot easily estimate).  ACC/AHA Guidelines for "Clearance":  Step 1 - Need for Emergency Surgery: No:   If Yes - go straight to OR with perioperative surveillance  Step 2 - Active Cardiac Conditions (Unstable Angina, Decompensated HF, Significant  Arrhytmias -  Complete HB, Mobitz II, Symptomatic VT or SVT, Severe Aortic Stenosis - mean gradient > 40 mmHg, Valve area < 1.0 cm2):   No:   Step 3 -  Low Risk Surgery: Yes  If Yes --> proceed to OR  Recommendation: Proceed OR without further cardiac evaluation. Continue beta blocker perioperatively but monitor for A. Fib or bradycardia. Continue statin perioperatively. Okay to hold warfarin  5 days preoperatively and restart once the postop.  No need to bridge. -- contact Coumadin clinic on stop & restart date to arrange INR follow-up post-op.      Relevant Orders   EKG 12-Lead (Completed)   Tachy-brady syndrome: Rates range from 50-169 bpm in A. fib (Chronic)    On the tolerated the beta blocker. Would need to monitor on telemetry perioperatively.      Unstable angina - history of; resolved, status post PCI of the OM    No recurrent symptoms consistent with angina. Her anginal equivalent was paced and jaw pain. No symptoms since her PCI. She does have when necessary nitroglycerin.         Orders Placed This Encounter  Procedures  . EKG 12-Lead    Order Specific Question:  Where should this test be performed    Answer:  [facility]   No orders of the defined types were placed in this encounter.     Followup: 6 months    Katonya Blecher, Leonie Green, M.D., M.S. Interventional Cardiologist   Pager # [phone]

## 2014-06-10 NOTE — Patient Instructions (Signed)
Your physician recommends that you continue on your current medications as directed. Please refer to the Current Medication list given to you today.  You have been cleared for surgery   Your physician wants you to follow-up in: 6 months with Dr.Harding You will receive a reminder letter in the mail two months in advance. If you don't receive a letter, please call our office to schedule the follow-up appointment.

## 2014-06-11 NOTE — Progress Notes (Signed)
Please put orders in Epic surgery 06-23-44 pre op 06-16-14 Thanks

## 2014-06-16 ENCOUNTER — Encounter (HOSPITAL_COMMUNITY): Payer: Self-pay

## 2014-06-16 ENCOUNTER — Other Ambulatory Visit: Payer: Self-pay | Admitting: Surgical

## 2014-06-16 ENCOUNTER — Encounter (HOSPITAL_COMMUNITY)
Admission: RE | Admit: 2014-06-16 | Discharge: 2014-06-16 | Disposition: A | Discharge status: Home / Self Care | Payer: Medicare Other | Source: Ambulatory Visit | Attending: Orthopedic Surgery | Admitting: Orthopedic Surgery

## 2014-06-16 DIAGNOSIS — Z01818 Encounter for other preprocedural examination: Secondary | ICD-10-CM | POA: Diagnosis not present

## 2014-06-16 LAB — PROTIME-INR
INR: 2.01 — ABNORMAL HIGH (ref 0.00–1.49)
Prothrombin Time: 22.9 seconds — ABNORMAL HIGH (ref 11.6–15.2)

## 2014-06-16 LAB — APTT: aPTT: 33 seconds (ref 24–37)

## 2014-06-16 NOTE — Patient Instructions (Signed)
Abigail Boyd  06/16/2014   Your procedure is scheduled on: Wednesday 06/24/2014  Report to [facility] Main  Entrance and follow signs to               [place] at [place].  Call this number if you have problems the morning of surgery [phone]   Remember:  Do not eat food or drink liquids :After Midnight.     Take these medicines the morning of surgery with A SIP OF WATER: Metoprolol, Nexium, Paroxetine (Paxil)                               You may not have any metal on your body including hair pins and              piercings  Do not wear jewelry, make-up, lotions, powders or perfumes.             Do not wear nail polish.  Do not shave  48 hours prior to surgery.              Men may shave face and neck.   Do not bring valuables to the hospital. [place].  Contacts, dentures or bridgework may not be worn into surgery.  Leave suitcase in the car. After surgery it may be brought to your room.     Patients discharged the day of surgery will not be allowed to drive home.  Name and phone number of your driver: sister- Abigail Boyd [phone]  Special Instructions: N/A              Please read over the following fact sheets you were given: _____________________________________________________________________             [facility] - Preparing for Surgery Before surgery, you can play an important role.  Because skin is not sterile, your skin needs to be as free of germs as possible.  You can reduce the number of germs on your skin by washing with CHG (chlorahexidine gluconate) soap before surgery.  CHG is an antiseptic cleaner which kills germs and bonds with the skin to continue killing germs even after washing. Please DO NOT use if you have an allergy to CHG or antibacterial soaps.  If your skin becomes reddened/irritated stop using the CHG and inform your nurse when you arrive at Short Stay. Do  not shave (including legs and underarms) for at least 48 hours prior to the first CHG shower.  You may shave your face/neck. Please follow these instructions carefully:  1.  Shower with CHG Soap the night before surgery and the  morning of Surgery.  2.  If you choose to wash your hair, wash your hair first as usual with your  normal  shampoo.  3.  After you shampoo, rinse your hair and body thoroughly to remove the  shampoo.                           4.  Use CHG as you would any other liquid soap.  You can apply chg directly  to the skin and wash  Gently with a scrungie or clean washcloth.  5.  Apply the CHG Soap to your body ONLY FROM THE NECK DOWN.   Do not use on face/ open                           Wound or open sores. Avoid contact with eyes, ears mouth and genitals (private parts).                       Wash face,  Genitals (private parts) with your normal soap.             6.  Wash thoroughly, paying special attention to the area where your surgery  will be performed.  7.  Thoroughly rinse your body with warm water from the neck down.  8.  DO NOT shower/wash with your normal soap after using and rinsing off  the CHG Soap.                9.  Pat yourself dry with a clean towel.            10.  Wear clean pajamas.            11.  Place clean sheets on your bed the night of your first shower and do not  sleep with pets. Day of Surgery : Do not apply any lotions/deodorants the morning of surgery.  Please wear clean clothes to the hospital/surgery center.  FAILURE TO FOLLOW THESE INSTRUCTIONS MAY RESULT IN THE CANCELLATION OF YOUR SURGERY PATIENT SIGNATURE_________________________________  NURSE SIGNATURE__________________________________  ________________________________________________________________________   Abigail Boyd  An incentive spirometer is a tool that can help keep your lungs clear and active. This tool measures how well you are filling your  lungs with each breath. Taking long deep breaths may help reverse or decrease the chance of developing breathing (pulmonary) problems (especially infection) following:  A long period of time when you are unable to move or be active. BEFORE THE PROCEDURE   If the spirometer includes an indicator to show your best effort, your nurse or respiratory therapist will set it to a desired goal.  If possible, sit up straight or lean slightly forward. Try not to slouch.  Hold the incentive spirometer in an upright position. INSTRUCTIONS FOR USE  1. Sit on the edge of your bed if possible, or sit up as far as you can in bed or on a chair. 2. Hold the incentive spirometer in an upright position. 3. Breathe out normally. 4. Place the mouthpiece in your mouth and seal your lips tightly around it. 5. Breathe in slowly and as deeply as possible, raising the piston or the ball toward the top of the column. 6. Hold your breath for 3-5 seconds or for as long as possible. Allow the piston or ball to fall to the bottom of the column. 7. Remove the mouthpiece from your mouth and breathe out normally. 8. Rest for a few seconds and repeat Steps 1 through 7 at least 10 times every 1-2 hours when you are awake. Take your time and take a few normal breaths between deep breaths. 9. The spirometer may include an indicator to show your best effort. Use the indicator as a goal to work toward during each repetition. 10. After each set of 10 deep breaths, practice coughing to be sure your lungs are clear. If you have an incision (the cut made at the time of surgery),  support your incision when coughing by placing a pillow or rolled up towels firmly against it. Once you are able to get out of bed, walk around indoors and cough well. You may stop using the incentive spirometer when instructed by your caregiver.  RISKS AND COMPLICATIONS  Take your time so you do not get dizzy or light-headed.  If you are in pain, you may need  to take or ask for pain medication before doing incentive spirometry. It is harder to take a deep breath if you are having pain. AFTER USE  Rest and breathe slowly and easily.  It can be helpful to keep track of a log of your progress. Your caregiver can provide you with a simple table to help with this. If you are using the spirometer at home, follow these instructions: [place] IF:   You are having difficultly using the spirometer.  You have trouble using the spirometer as often as instructed.  Your pain medication is not giving enough relief while using the spirometer.  You develop fever of 100.5 F (38.1 C) or higher. SEEK IMMEDIATE MEDICAL CARE IF:   You cough up bloody sputum that had not been present before.  You develop fever of 102 F (38.9 C) or greater.  You develop worsening pain at or near the incision site. MAKE SURE YOU:   Understand these instructions.  Will watch your condition.  Will get help right away if you are not doing well or get worse. Document Released: 06/26/2006 Document Revised: 05/08/2011 Document Reviewed: 08/27/2006 [facility] Patient Information 2014 [place], [place].   ________________________________________________________________________

## 2014-06-16 NOTE — Progress Notes (Addendum)
06/09/2014-labs -CBC with diff./ BMET, Hgb A1C, hepatic function from [facility] on chart. Pre-operative medical clearance from Dr. Ellyn Hack on chart. 06/10/2014 -Ekg in EPIC

## 2014-06-24 ENCOUNTER — Encounter (HOSPITAL_COMMUNITY): Payer: Self-pay | Admitting: *Deleted

## 2014-06-24 ENCOUNTER — Ambulatory Visit (HOSPITAL_COMMUNITY)
Admission: RE | Admit: 2014-06-24 | Discharge: 2014-06-24 | Disposition: A | Discharge status: Home / Self Care | Payer: Medicare Other | Source: Ambulatory Visit | Attending: Orthopedic Surgery | Admitting: Orthopedic Surgery

## 2014-06-24 ENCOUNTER — Encounter (HOSPITAL_COMMUNITY)
Admission: RE | Disposition: A | Discharge status: Home / Self Care | Payer: Self-pay | Source: Ambulatory Visit | Attending: Orthopedic Surgery

## 2014-06-24 ENCOUNTER — Ambulatory Visit (HOSPITAL_COMMUNITY): Payer: Medicare Other | Admitting: Registered Nurse

## 2014-06-24 DIAGNOSIS — G4733 Obstructive sleep apnea (adult) (pediatric): Secondary | ICD-10-CM | POA: Diagnosis not present

## 2014-06-24 DIAGNOSIS — S83241A Other tear of medial meniscus, current injury, right knee, initial encounter: Secondary | ICD-10-CM | POA: Insufficient documentation

## 2014-06-24 DIAGNOSIS — F419 Anxiety disorder, unspecified: Secondary | ICD-10-CM | POA: Diagnosis not present

## 2014-06-24 DIAGNOSIS — I480 Paroxysmal atrial fibrillation: Secondary | ICD-10-CM | POA: Insufficient documentation

## 2014-06-24 DIAGNOSIS — M1711 Unilateral primary osteoarthritis, right knee: Secondary | ICD-10-CM | POA: Diagnosis not present

## 2014-06-24 DIAGNOSIS — N182 Chronic kidney disease, stage 2 (mild): Secondary | ICD-10-CM | POA: Diagnosis not present

## 2014-06-24 DIAGNOSIS — Z79899 Other long term (current) drug therapy: Secondary | ICD-10-CM | POA: Diagnosis not present

## 2014-06-24 DIAGNOSIS — E785 Hyperlipidemia, unspecified: Secondary | ICD-10-CM | POA: Diagnosis not present

## 2014-06-24 DIAGNOSIS — X58XXXA Exposure to other specified factors, initial encounter: Secondary | ICD-10-CM | POA: Diagnosis not present

## 2014-06-24 DIAGNOSIS — K219 Gastro-esophageal reflux disease without esophagitis: Secondary | ICD-10-CM | POA: Diagnosis not present

## 2014-06-24 DIAGNOSIS — I2510 Atherosclerotic heart disease of native coronary artery without angina pectoris: Secondary | ICD-10-CM | POA: Insufficient documentation

## 2014-06-24 DIAGNOSIS — M25561 Pain in right knee: Secondary | ICD-10-CM | POA: Diagnosis present

## 2014-06-24 DIAGNOSIS — E119 Type 2 diabetes mellitus without complications: Secondary | ICD-10-CM | POA: Insufficient documentation

## 2014-06-24 DIAGNOSIS — I129 Hypertensive chronic kidney disease with stage 1 through stage 4 chronic kidney disease, or unspecified chronic kidney disease: Secondary | ICD-10-CM | POA: Insufficient documentation

## 2014-06-24 HISTORY — PX: KNEE ARTHROSCOPY WITH MEDIAL MENISECTOMY: SHX5651

## 2014-06-24 LAB — PROTIME-INR
INR: 1.02 (ref 0.00–1.49)
PROTHROMBIN TIME: 13.5 s (ref 11.6–15.2)

## 2014-06-24 LAB — GLUCOSE, CAPILLARY: Glucose-Capillary: 191 mg/dL — ABNORMAL HIGH (ref 70–99)

## 2014-06-24 SURGERY — ARTHROSCOPY, KNEE, WITH MEDIAL MENISCECTOMY
Anesthesia: General | Site: Knee | Laterality: Right

## 2014-06-24 MED ORDER — PROPOFOL 10 MG/ML IV BOLUS
INTRAVENOUS | Status: AC
  Filled 2014-06-24: qty 20

## 2014-06-24 MED ORDER — CEFAZOLIN SODIUM-DEXTROSE 2-3 GM-% IV SOLR
2.0000 g | INTRAVENOUS | Status: AC
  Administered 2014-06-24: 2 g via INTRAVENOUS

## 2014-06-24 MED ORDER — DEXAMETHASONE SODIUM PHOSPHATE 10 MG/ML IJ SOLN
INTRAMUSCULAR | Status: DC | PRN
Start: 2014-06-24 — End: 2014-06-24
  Administered 2014-06-24: 10 mg via INTRAVENOUS

## 2014-06-24 MED ORDER — BACITRACIN ZINC 500 UNIT/GM EX OINT
TOPICAL_OINTMENT | CUTANEOUS | Status: DC | PRN
  Administered 2014-06-24: 1 via TOPICAL

## 2014-06-24 MED ORDER — ONDANSETRON HCL 4 MG/2ML IJ SOLN
INTRAMUSCULAR | Status: DC | PRN
  Administered 2014-06-24: 4 mg via INTRAVENOUS

## 2014-06-24 MED ORDER — LACTATED RINGERS IV SOLN: INTRAVENOUS | Status: DC

## 2014-06-24 MED ORDER — MIDAZOLAM HCL 2 MG/2ML IJ SOLN
INTRAMUSCULAR | Status: AC
  Filled 2014-06-24: qty 2

## 2014-06-24 MED ORDER — LIDOCAINE HCL (CARDIAC) 10 MG/ML IV SOLN
INTRAVENOUS | Status: DC | PRN
  Administered 2014-06-24: 75 mg via INTRAVENOUS

## 2014-06-24 MED ORDER — OXYCODONE-ACETAMINOPHEN 10-325 MG PO TABS: 1.0000 | ORAL_TABLET | ORAL | Status: DC | PRN

## 2014-06-24 MED ORDER — PROMETHAZINE HCL 25 MG/ML IJ SOLN: 6.2500 mg | INTRAMUSCULAR | Status: DC | PRN

## 2014-06-24 MED ORDER — BACITRACIN ZINC 500 UNIT/GM EX OINT
TOPICAL_OINTMENT | CUTANEOUS | Status: AC
  Filled 2014-06-24: qty 28.35

## 2014-06-24 MED ORDER — MEPERIDINE HCL 50 MG/ML IJ SOLN: 6.2500 mg | INTRAMUSCULAR | Status: DC | PRN

## 2014-06-24 MED ORDER — CEFAZOLIN SODIUM-DEXTROSE 2-3 GM-% IV SOLR
INTRAVENOUS | Status: AC
  Filled 2014-06-24: qty 50

## 2014-06-24 MED ORDER — DEXAMETHASONE SODIUM PHOSPHATE 10 MG/ML IJ SOLN
INTRAMUSCULAR | Status: AC
  Filled 2014-06-24: qty 1

## 2014-06-24 MED ORDER — FENTANYL CITRATE (PF) 100 MCG/2ML IJ SOLN
INTRAMUSCULAR | Status: DC | PRN
  Administered 2014-06-24 (×2): 50 ug via INTRAVENOUS

## 2014-06-24 MED ORDER — ACETAMINOPHEN 10 MG/ML IV SOLN
1000.0000 mg | Freq: Once | INTRAVENOUS | Status: AC
  Administered 2014-06-24: 1000 mg via INTRAVENOUS
  Filled 2014-06-24: qty 100

## 2014-06-24 MED ORDER — FENTANYL CITRATE (PF) 250 MCG/5ML IJ SOLN
INTRAMUSCULAR | Status: AC
  Filled 2014-06-24: qty 5

## 2014-06-24 MED ORDER — BUPIVACAINE-EPINEPHRINE 0.25% -1:200000 IJ SOLN
INTRAMUSCULAR | Status: DC | PRN
  Administered 2014-06-24: 30 mL

## 2014-06-24 MED ORDER — LACTATED RINGERS IV SOLN
INTRAVENOUS | Status: DC
  Administered 2014-06-24 (×2): via INTRAVENOUS

## 2014-06-24 MED ORDER — LACTATED RINGERS IR SOLN
Status: DC | PRN
  Administered 2014-06-24: 6000 mL

## 2014-06-24 MED ORDER — BUPIVACAINE-EPINEPHRINE (PF) 0.25% -1:200000 IJ SOLN
INTRAMUSCULAR | Status: AC
  Filled 2014-06-24: qty 30

## 2014-06-24 MED ORDER — LIDOCAINE HCL (CARDIAC) 20 MG/ML IV SOLN
INTRAVENOUS | Status: AC
  Filled 2014-06-24: qty 5

## 2014-06-24 MED ORDER — ONDANSETRON HCL 4 MG/2ML IJ SOLN
INTRAMUSCULAR | Status: AC
  Filled 2014-06-24: qty 2

## 2014-06-24 MED ORDER — FENTANYL CITRATE (PF) 100 MCG/2ML IJ SOLN: 25.0000 ug | INTRAMUSCULAR | Status: DC | PRN

## 2014-06-24 MED ORDER — MIDAZOLAM HCL 5 MG/5ML IJ SOLN
INTRAMUSCULAR | Status: DC | PRN
  Administered 2014-06-24: 0.5 mg via INTRAVENOUS

## 2014-06-24 MED ORDER — PROPOFOL 10 MG/ML IV BOLUS
INTRAVENOUS | Status: DC | PRN
  Administered 2014-06-24: 120 mg via INTRAVENOUS
  Administered 2014-06-24: 50 mg via INTRAVENOUS

## 2014-06-24 SURGICAL SUPPLY — 33 items
BANDAGE ELASTIC 4 VELCRO ST LF (GAUZE/BANDAGES/DRESSINGS) ×3 IMPLANT
BANDAGE ELASTIC 6 VELCRO ST LF (GAUZE/BANDAGES/DRESSINGS) ×3 IMPLANT
BLADE GREAT WHITE 4.2 (BLADE) ×2 IMPLANT
BLADE GREAT WHITE 4.2MM (BLADE) ×1
BLADE SURG SZ11 CARB STEEL (BLADE) IMPLANT
BNDG COHESIVE 6X5 TAN STRL LF (GAUZE/BANDAGES/DRESSINGS) ×3 IMPLANT
CLOTH BEACON ORANGE TIMEOUT ST (SAFETY) ×3 IMPLANT
COUNTER NEEDLE 20 DBL MAG RED (NEEDLE) ×3 IMPLANT
DRAPE SHEET LG 3/4 BI-LAMINATE (DRAPES) ×3 IMPLANT
DRSG ADAPTIC 3X8 NADH LF (GAUZE/BANDAGES/DRESSINGS) ×3 IMPLANT
DRSG PAD ABDOMINAL 8X10 ST (GAUZE/BANDAGES/DRESSINGS) ×6 IMPLANT
DURAPREP 26ML APPLICATOR (WOUND CARE) ×3 IMPLANT
GAUZE SPONGE 4X4 12PLY STRL (GAUZE/BANDAGES/DRESSINGS) ×3 IMPLANT
GLOVE BIOGEL PI IND STRL 6.5 (GLOVE) ×1 IMPLANT
GLOVE BIOGEL PI IND STRL 8 (GLOVE) ×1 IMPLANT
GLOVE BIOGEL PI INDICATOR 6.5 (GLOVE) ×2
GLOVE BIOGEL PI INDICATOR 8 (GLOVE) ×2
GLOVE ECLIPSE 8.0 STRL XLNG CF (GLOVE) ×3 IMPLANT
GLOVE SURG SS PI 7.0 STRL IVOR (GLOVE) ×3 IMPLANT
GOWN STRL REUS W/TWL XL LVL3 (GOWN DISPOSABLE) ×3 IMPLANT
KIT BASIN OR (CUSTOM PROCEDURE TRAY) IMPLANT
MANIFOLD NEPTUNE II (INSTRUMENTS) ×3 IMPLANT
PACK ARTHROSCOPY WL (CUSTOM PROCEDURE TRAY) ×3 IMPLANT
PACK ICE MAXI GEL EZY WRAP (MISCELLANEOUS) ×3 IMPLANT
PAD MASON LEG HOLDER (PIN) ×3 IMPLANT
SET ARTHROSCOPY TUBING (MISCELLANEOUS) ×2
SET ARTHROSCOPY TUBING LN (MISCELLANEOUS) ×1 IMPLANT
SUT ETHILON 3 0 PS 1 (SUTURE) ×3 IMPLANT
TOWEL OR 17X26 10 PK STRL BLUE (TOWEL DISPOSABLE) ×3 IMPLANT
TUBING CONNECTING 10 (TUBING) ×2 IMPLANT
TUBING CONNECTING 10' (TUBING) ×1
WAND 90 DEG TURBOVAC W/CORD (SURGICAL WAND) ×3 IMPLANT
WRAP KNEE MAXI GEL POST OP (GAUZE/BANDAGES/DRESSINGS) ×3 IMPLANT

## 2014-06-24 NOTE — Transfer of Care (Signed)
Immediate Anesthesia Transfer of Care Note  Patient: Abigail Boyd  Procedure(s) Performed: Procedure(s): RIGHT KNEE ARTHROSCOPY WITH partial lateral MENISECTOMY, abrasion chondroplasty of medial femoral condyle and patella, microfracture technique (Right)  Patient Location: PACU  Anesthesia Type:General  Level of Consciousness: awake, alert , oriented and patient cooperative  Airway & Oxygen Therapy: Patient Spontanous Breathing and Patient connected to face mask oxygen  Post-op Assessment: Report given to RN, Post -op Vital signs reviewed and stable and Patient moving all extremities X 4  Post vital signs: stable  Last Vitals:  Filed Vitals:   06/24/14 0830  BP: 119/57  Pulse:   Temp:   Resp:     Complications: No apparent anesthesia complications

## 2014-06-24 NOTE — Brief Op Note (Signed)
06/24/2014  8:25 AM  PATIENT:  Abigail Boyd  81 y.o. female  PRE-OPERATIVE DIAGNOSIS:  RIGHT KNEE MEDIAL MENISCUS TEAR and Primary Osteoarthri  POST-OPERATIVE DIAGNOSIS:  RIGHT KNEE MEDIAL MENISCUS TEAR and Primary osteoarthritis.  PROCEDURE:  Diagnostic Arthroscopy,Medial meniscectomy,Abraison Chondroplasties of medial Femoral Condyle and Patella and Microfracture of medial Femoral Condyle.  SURGEON:  Surgeon(s) and Role:    * Latanya Maudlin, MD - Primary    ASSISTANTS: OR Nurse}   ANESTHESIA:  General   EBL:   5cc  BLOOD ADMINISTERED:none  DRAINS: none   LOCAL MEDICATIONS USED:  MARCAINE 30cc of 0.25% with Epinephrine.    SPECIMEN:  No Specimen  DISPOSITION OF SPECIMEN:  N/A  COUNTS:  YES  TOURNIQUET:  * No tourniquets in log *  DICTATION: .Other Dictation: Dictation Number [id number]  PLAN OF CARE: Discharge to home after PACU  PATIENT DISPOSITION:  PACU - hemodynamically stable.   Delay start of Pharmacological VTE agent (>24hrs) due to surgical blood loss or risk of bleeding: yes

## 2014-06-24 NOTE — Op Note (Signed)
Abigail Boyd, Abigail Boyd                  ACCOUNT NO.:  [id number]  MEDICAL RECORD NO.:  [id number]  LOCATION:  [facility]                         FACILITY:  [facility]  PHYSICIAN:  Kipp Brood. Edy Belt, M.D.DATE OF BIRTH:  August 19, 1929  DATE OF PROCEDURE:  06/24/2014 DATE OF DISCHARGE:                              OPERATIVE REPORT   SURGEON:  Kipp Brood. Auriel Kist, M.D.  ASSISTANT:  OR Nurse.  PREOPERATIVE DIAGNOSES: 1. Primary osteoarthritis to the right knee. 2. Torn medial meniscus, right knee.  POSTOPERATIVE DIAGNOSES: 1. Primary osteoarthritis to the right knee. 2. Torn medial meniscus, right knee.  OPERATION: 1. Diagnostic arthroscopy, right knee. 2. Medial meniscectomy to posterior horn, right knee. 3. Abrasion chondroplasty, patella, right knee. 4. Abrasion chondroplasty, medial femoral condyle, right knee. 5. Microfracture technique, medial femoral condyle, right knee.  DESCRIPTION OF PROCEDURE:  Under general anesthesia, routine orthopedic prepping and draping the right lower extremity was carried out.  The patient had 1 g of IV Ancef and 1 g of Tylenol.  An appropriate time-out was first carried out.  I also marked the appropriate right leg in the holding area.  The right leg was placed in a knee holder and then a sterile prepping and draping were carried out.  A small punctate incision was made in suprapatellar pouch, inflow cannula was inserted, knee was distended with saline.  I then went up and made a small incision over the anterolateral aspect of the right knee.  The arthroscope was entered from lateral approach, and a complete diagnostic arthroscopy was carried out.  I went up in the suprapatellar pouch.  She had a chondromalacia of the patella.  I did abrasion chondroplasty of the patella and a partial synovectomy.  Lateral joint just showed arthritic changes.  Lateral meniscus was definitely intact.  Cruciates were intact.  Medial joint was the main problem.  She had  significant localized chondromalacia of the femoral condyle.  I did an abrasion chondroplasty, then a microfracture technique, mainly because it was so localized.  After this, I went back to the medial meniscus.  There was a small tear of the medial meniscus.  We did a partial medial meniscectomy.  The remaining part of meniscus was in good shape.  I thoroughly irrigated out the knee, removed the fluid, closed all 3 punctate incisions with 3-0 nylon suture.  I injected 30 mL of 0.25% Marcaine with epinephrine in the joint and a sterile dressing was applied.  The patient left the operating room in satisfactory condition.  She will be on aspirin postop as a blood thinner.          ______________________________ Kipp Brood. Gladstone Lighter, M.D.     RAG/MEDQ  D:  06/24/2014  T:  06/24/2014  Job:  [id number]

## 2014-06-24 NOTE — Anesthesia Preprocedure Evaluation (Addendum)
Anesthesia Evaluation  Patient identified by MRN, date of birth, ID band Patient awake    Reviewed: Allergy & Precautions, NPO status , Patient's Chart, lab work & pertinent test results  Airway Mallampati: II  TM Distance: >3 FB Neck ROM: Full    Dental no notable dental hx.    Pulmonary sleep apnea ,  breath sounds clear to auscultation  Pulmonary exam normal       Cardiovascular hypertension, Pt. on medications and Pt. on home beta blockers + CAD and + Cardiac Stents + dysrhythmias Atrial Fibrillation Rhythm:Regular Rate:Normal     Neuro/Psych negative neurological ROS  negative psych ROS   GI/Hepatic negative GI ROS, Neg liver ROS,   Endo/Other  negative endocrine ROSdiabetes, Type 2  Renal/GU negative Renal ROS  negative genitourinary   Musculoskeletal negative musculoskeletal ROS (+)   Abdominal   Peds negative pediatric ROS (+)  Hematology negative hematology ROS (+)   Anesthesia Other Findings   Reproductive/Obstetrics negative OB ROS                            Anesthesia Physical Anesthesia Plan  ASA: III  Anesthesia Plan: General   Post-op Pain Management:    Induction: Intravenous  Airway Management Planned: LMA  Additional Equipment:   Intra-op Plan:   Post-operative Plan: Extubation in OR  Informed Consent: I have reviewed the patients History and Physical, chart, labs and discussed the procedure including the risks, benefits and alternatives for the proposed anesthesia with the patient or authorized representative who has indicated his/her understanding and acceptance.   Dental advisory given  Plan Discussed with: CRNA  Anesthesia Plan Comments:         Anesthesia Quick Evaluation

## 2014-06-24 NOTE — Anesthesia Procedure Notes (Signed)
Procedure Name: LMA Insertion Date/Time: 06/24/2014 7:35 AM Performed by: Enrigue Catena E Pre-anesthesia Checklist: Patient identified, Emergency Drugs available, Suction available and Patient being monitored Patient Re-evaluated:Patient Re-evaluated prior to inductionOxygen Delivery Method: Circle System Utilized Preoxygenation: Pre-oxygenation with 100% oxygen Intubation Type: IV induction Ventilation: Mask ventilation without difficulty LMA: LMA inserted LMA Size: 4.0 Tube type: Oral Number of attempts: 1 Airway Equipment and Method: Oral airway Placement Confirmation: positive ETCO2 and breath sounds checked- equal and bilateral Tube secured with: Tape (paper tpe) Dental Injury: Teeth and Oropharynx as per pre-operative assessment

## 2014-06-24 NOTE — Interval H&P Note (Signed)
History and Physical Interval Note:  06/24/2014 7:10 AM  Abigail Boyd  has presented today for surgery, with the diagnosis of RIGHT KNEE MEDIAL MENISCUS TEAR  The various methods of treatment have been discussed with the patient and family. After consideration of risks, benefits and other options for treatment, the patient has consented to  Procedure(s): RIGHT KNEE ARTHROSCOPY WITH MEDIAL MENISECTOMY (Right) as a surgical intervention .  The patient's history has been reviewed, patient examined, no change in status, stable for surgery.  I have reviewed the patient's chart and labs.  Questions were answered to the patient's satisfaction.     Mischelle Reeg A

## 2014-06-24 NOTE — Anesthesia Postprocedure Evaluation (Signed)
  Anesthesia Post-op Note  Patient: Abigail Boyd  Procedure(s) Performed: Procedure(s) (LRB): RIGHT KNEE ARTHROSCOPY WITH partial lateral MENISECTOMY, abrasion chondroplasty of medial femoral condyle and patella, microfracture technique (Right)  Patient Location: PACU  Anesthesia Type: General  Level of Consciousness: awake and alert   Airway and Oxygen Therapy: Patient Spontanous Breathing  Post-op Pain: mild  Post-op Assessment: Post-op Vital signs reviewed, Patient's Cardiovascular Status Stable, Respiratory Function Stable, Patent Airway and No signs of Nausea or vomiting  Last Vitals:  Filed Vitals:   06/24/14 1300  BP: 151/69  Pulse: 78  Temp:   Resp: 18    Post-op Vital Signs: stable   Complications: No apparent anesthesia complications

## 2014-06-24 NOTE — H&P (Signed)
Abigail Boyd is an 81 y.o. female.   Chief Complaint: Pain and catching of Right Knee. HPI: Progressive pain and swelling of Right Knee.  Past Medical History  Diagnosis Date  . History of unstable angina 06/13/2011    Jaw pain awakening from sleep -- Myoview --CATH --> PCI  . Abnormal nuclear stress test 06/21/2011    Inferolateral reversible defect;--> cardiac cath & PCI of CxOM, occluded RCA with collaterals;; followup Myoview 11/2012: Low risk. Fixed basal inferior artifact normal EF. No ischemia  . CAD S/P percutaneous coronary angioplasty     s/P PCI to proximal OM1 w/ DES; occluded RCA with bridging and L-R collaterals (medical management)  . Shortness of breath on exertion October 2013    2-D echo: Normal EF>55%, Gr 1DD, mild aortic sclerosis; Evaluated with CPET - peak VO2 97%; Chronotropic Incompetence (submaximal effort)  . Chronotropic incompetence with sinus node dysfunction October 2013    On CPET test; beta blockers reduced  . PAF (paroxysmal atrial fibrillation) 10-11/ 2014    CardioNet Event Monitor: NSR & S Brady -- Rates 50-100; Total A. fib burden 35 hours and 27 minutes. 1296 episodes, longest was 1 hour 29 minutes. Rate ranged from 52-169 beats per minute.  . Tachycardia-bradycardia syndrome 11/2012  . History of syncope     Per EP - neurocardiogenic & not Bradycardia related (no PPM)  . OSA (obstructive sleep apnea)     hx bladder infections  . Diabetes mellitus type 2, controlled     On oral medications  . Essential hypertension     Allowing for permissive hypertension to avoid orthostatic hypotension  . Hyperlipidemia with target LDL less than 70     HDL at goal, LDL not at goal, borderline triglycerides. On Crestor 20 mg  . Chronic kidney disease (CKD), stage II (mild)     Related to current bladder infections (although diabetes cannot be excluded)  . Spinal stenosis of lumbar region 11/2011  . Osteoarthritis   . GERD (gastroesophageal reflux disease)      On PPI  . Seasonal allergies   . Anxiety     When necessary Xanax  . Glaucoma   . Bilateral cataracts     Status post stroke or correction    Past Surgical History  Procedure Laterality Date  . Cataracts      both eyes  . Colonoscopy    . Vaginal hysterectomy    . Breast biopsy      both breast  . Cardiac catheterization  07/05/2011    Unstable Angina --> Myoview wiht Inf-Lat Ischemia.  Proximal OM1 lesion --> PCI; 100% mid RCA with right to right and left to right bridging collaterals from circumflex RPL and LAD the PDA.  Marland Kitchen Coronary angioplasty with stent placement  07/05/2011    Promus Element DES 2.5 mm x 16 mm-post dilated to 2.65 mm CX-Proximal OM1  . Nm myoview ltd  October 2014     Low risk. Fixed basal inferior artifact normal EF. No ischemia --> (as compared to pre-PCI Myoview revealing inferolateral ischemia)  . Transthoracic echocardiogram  October 2013    Normal EF, >55%. No regional WMA, Grd 1 DD,  mildly sclerotic aortic valve without stenosis  . Left heart catheterization with coronary angiogram N/A 07/06/2011    Procedure: LEFT HEART CATHETERIZATION WITH CORONARY ANGIOGRAM;  Surgeon: Leonie Man, MD;  [facility] C[facility];  Service: Cardiovascular;  Laterality: N/A;    Family History  Problem Relation Age of Onset  .  Colon cancer Neg Hx   . Esophageal cancer Neg Hx   . Stomach cancer Neg Hx   . COPD Mother   . Heart attack Father   . Heart disease Father   . Cancer Sister   . Cancer Sister   . Pulmonary fibrosis Mother   . Clotting disorder Sister   . Heart attack Sister   . Stroke Father    Social History:  reports that she has never smoked. She has never used smokeless tobacco. She reports that she does not drink alcohol or use illicit drugs.  Allergies:  Allergies  Allergen Reactions  . Lisinopril Cough  . Losartan Cough  . Sulfa Antibiotics Hives    Medications Prior to Admission  Medication Sig Dispense Refill  . ALPRAZolam (XANAX) 0.25 MG  tablet Take 0.25 mg by mouth at bedtime as needed for sleep or anxiety.     Marland Kitchen azelastine (ASTELIN) 137 MCG/SPRAY nasal spray Place 1 spray into both nostrils every evening.     . cholecalciferol (VITAMIN D) 1000 UNITS tablet Take 1,000 Units by mouth daily.    Marland Kitchen docusate sodium (COLACE) 100 MG capsule Take 100 mg by mouth at bedtime.     . fexofenadine (ALLEGRA) 180 MG tablet Take 180 mg by mouth daily.    . fish oil-omega-3 fatty acids 1000 MG capsule Take 1 g by mouth daily.     Marland Kitchen glimepiride (AMARYL) 2 MG tablet Take 2 mg by mouth daily before breakfast.    . ipratropium (ATROVENT) 0.06 % nasal spray Place 1 spray into the nose at bedtime.     Marland Kitchen JANUVIA 50 MG tablet Take 1 tablet by mouth daily.    . Magnesium 500 MG CAPS Take 1 capsule by mouth daily.    . metFORMIN (GLUCOPHAGE) 1000 MG tablet Take 1,000 mg by mouth 2 (two) times daily with a meal.    . metoprolol tartrate (LOPRESSOR) 25 MG tablet Take 0.5 tablets (12.5 mg total) by mouth 2 (two) times daily. 60 tablet 5  . NEXIUM 40 MG capsule Take 40 mg by mouth daily.    Marland Kitchen PARoxetine (PAXIL) 10 MG tablet Take 10 mg by mouth daily.     Marland Kitchen rOPINIRole (REQUIP) 0.5 MG tablet TAKE 1 TABLET AFTER DINNER EACH NIGHT. 30 tablet 3  . rosuvastatin (CRESTOR) 20 MG tablet Take 20 mg by mouth at bedtime.     Marland Kitchen warfarin (COUMADIN) 5 MG tablet Take 1 to 1.5 tablets by mouth daily as directed by coumadin clinic (Patient taking differently: Take 5-7.5 mg by mouth daily. ) 120 tablet 1  . nitroGLYCERIN (NITROSTAT) 0.4 MG SL tablet Place 0.4 mg under the tongue every 5 (five) minutes as needed for chest pain.      Results for orders placed or performed during the hospital encounter of 06/24/14 (from the past 48 hour(s))  Glucose, capillary     Status: Abnormal   Collection Time: 06/24/14  5:54 AM  Result Value Ref Range   Glucose-Capillary 191 (H) 70 - 99 mg/dL   Comment 1 Notify RN   Protime-INR     Status: None   Collection Time: 06/24/14  6:36 AM   Result Value Ref Range   Prothrombin Time 13.5 11.6 - 15.2 seconds   INR 1.02 0.00 - 1.49   No results found.  Review of Systems  Constitutional: Negative.   HENT: Negative.   Eyes: Negative.   Respiratory: Negative.   Cardiovascular: Negative.   Gastrointestinal: Negative.  Genitourinary: Negative.   Musculoskeletal: Positive for joint pain.  Skin: Negative.   Neurological: Negative.   Endo/Heme/Allergies: Negative.   Psychiatric/Behavioral: Negative.     Blood pressure 143/56, pulse 75, temperature 97.6 F (36.4 C), temperature source Oral, resp. rate 16, height 5\' 3"  (1.6 m), weight 77.202 kg (170 lb 3.2 oz), SpO2 97 %. Physical Exam  Constitutional: She appears well-developed.  HENT:  Head: Normocephalic.  Eyes: Pupils are equal, round, and reactive to light.  Neck: Normal range of motion.  Cardiovascular: Normal rate.   Respiratory: Effort normal.  GI: Soft.  Musculoskeletal:  Painful,swollen Right Knee     Assessment/Plan Arthroscopic medial meniscectomy of right Knee.  Abigail Boyd A 06/24/2014, 7:07 AM

## 2014-06-25 ENCOUNTER — Encounter (HOSPITAL_COMMUNITY): Payer: Self-pay | Admitting: Orthopedic Surgery

## 2014-07-08 ENCOUNTER — Ambulatory Visit (INDEPENDENT_AMBULATORY_CARE_PROVIDER_SITE_OTHER): Payer: Medicare Other

## 2014-07-08 DIAGNOSIS — I4891 Unspecified atrial fibrillation: Secondary | ICD-10-CM | POA: Diagnosis not present

## 2014-07-08 DIAGNOSIS — I480 Paroxysmal atrial fibrillation: Secondary | ICD-10-CM | POA: Diagnosis not present

## 2014-07-08 DIAGNOSIS — Z7901 Long term (current) use of anticoagulants: Secondary | ICD-10-CM | POA: Diagnosis not present

## 2014-07-08 LAB — POCT INR: INR: 2.9

## 2014-07-19 ENCOUNTER — Other Ambulatory Visit: Payer: Self-pay | Admitting: Pulmonary Disease

## 2014-07-30 ENCOUNTER — Other Ambulatory Visit: Payer: Self-pay | Admitting: Cardiology

## 2014-07-30 NOTE — Telephone Encounter (Signed)
Rx(s) sent to pharmacy electronically.  

## 2014-08-05 ENCOUNTER — Ambulatory Visit (INDEPENDENT_AMBULATORY_CARE_PROVIDER_SITE_OTHER): Payer: Medicare Other

## 2014-08-05 DIAGNOSIS — I480 Paroxysmal atrial fibrillation: Secondary | ICD-10-CM | POA: Diagnosis not present

## 2014-08-05 DIAGNOSIS — Z7901 Long term (current) use of anticoagulants: Secondary | ICD-10-CM | POA: Diagnosis not present

## 2014-08-05 DIAGNOSIS — I4891 Unspecified atrial fibrillation: Secondary | ICD-10-CM

## 2014-08-05 LAB — POCT INR
INR: 1.7
INR: 1.7

## 2014-09-01 ENCOUNTER — Ambulatory Visit (INDEPENDENT_AMBULATORY_CARE_PROVIDER_SITE_OTHER): Payer: Medicare Other | Admitting: Pharmacist

## 2014-09-01 DIAGNOSIS — Z7901 Long term (current) use of anticoagulants: Secondary | ICD-10-CM

## 2014-09-01 DIAGNOSIS — I480 Paroxysmal atrial fibrillation: Secondary | ICD-10-CM

## 2014-09-01 DIAGNOSIS — I4891 Unspecified atrial fibrillation: Secondary | ICD-10-CM

## 2014-09-01 LAB — POCT INR: INR: 4.9

## 2014-09-09 ENCOUNTER — Ambulatory Visit (INDEPENDENT_AMBULATORY_CARE_PROVIDER_SITE_OTHER): Payer: Medicare Other

## 2014-09-09 DIAGNOSIS — I4891 Unspecified atrial fibrillation: Secondary | ICD-10-CM | POA: Diagnosis not present

## 2014-09-09 DIAGNOSIS — I480 Paroxysmal atrial fibrillation: Secondary | ICD-10-CM

## 2014-09-09 DIAGNOSIS — Z7901 Long term (current) use of anticoagulants: Secondary | ICD-10-CM | POA: Diagnosis not present

## 2014-09-09 LAB — POCT INR: INR: 2.9

## 2014-09-23 ENCOUNTER — Ambulatory Visit (INDEPENDENT_AMBULATORY_CARE_PROVIDER_SITE_OTHER): Payer: Medicare Other

## 2014-09-23 DIAGNOSIS — Z7901 Long term (current) use of anticoagulants: Secondary | ICD-10-CM | POA: Diagnosis not present

## 2014-09-23 DIAGNOSIS — I480 Paroxysmal atrial fibrillation: Secondary | ICD-10-CM

## 2014-09-23 DIAGNOSIS — I4891 Unspecified atrial fibrillation: Secondary | ICD-10-CM | POA: Diagnosis not present

## 2014-09-23 LAB — POCT INR: INR: 3.2

## 2014-09-25 ENCOUNTER — Other Ambulatory Visit: Payer: Self-pay | Admitting: Cardiology

## 2014-10-07 ENCOUNTER — Ambulatory Visit (INDEPENDENT_AMBULATORY_CARE_PROVIDER_SITE_OTHER): Payer: Medicare Other | Admitting: *Deleted

## 2014-10-07 DIAGNOSIS — I4891 Unspecified atrial fibrillation: Secondary | ICD-10-CM

## 2014-10-07 DIAGNOSIS — Z7901 Long term (current) use of anticoagulants: Secondary | ICD-10-CM | POA: Diagnosis not present

## 2014-10-07 DIAGNOSIS — I480 Paroxysmal atrial fibrillation: Secondary | ICD-10-CM | POA: Diagnosis not present

## 2014-10-07 LAB — POCT INR: INR: 3.2

## 2014-10-20 ENCOUNTER — Ambulatory Visit (INDEPENDENT_AMBULATORY_CARE_PROVIDER_SITE_OTHER): Payer: Medicare Other

## 2014-10-20 DIAGNOSIS — Z7901 Long term (current) use of anticoagulants: Secondary | ICD-10-CM

## 2014-10-20 DIAGNOSIS — I480 Paroxysmal atrial fibrillation: Secondary | ICD-10-CM

## 2014-10-20 DIAGNOSIS — I4891 Unspecified atrial fibrillation: Secondary | ICD-10-CM

## 2014-10-20 LAB — POCT INR: INR: 2.7

## 2014-11-05 ENCOUNTER — Emergency Department (HOSPITAL_COMMUNITY): Payer: Medicare Other

## 2014-11-05 ENCOUNTER — Emergency Department (HOSPITAL_COMMUNITY)
Admission: EM | Admit: 2014-11-05 | Discharge: 2014-11-06 | Disposition: A | Discharge status: Home / Self Care | Payer: Medicare Other | Attending: Emergency Medicine | Admitting: Emergency Medicine

## 2014-11-05 ENCOUNTER — Encounter (HOSPITAL_COMMUNITY): Payer: Self-pay | Admitting: *Deleted

## 2014-11-05 DIAGNOSIS — Z7901 Long term (current) use of anticoagulants: Secondary | ICD-10-CM | POA: Insufficient documentation

## 2014-11-05 DIAGNOSIS — Z8719 Personal history of other diseases of the digestive system: Secondary | ICD-10-CM | POA: Diagnosis not present

## 2014-11-05 DIAGNOSIS — E1165 Type 2 diabetes mellitus with hyperglycemia: Secondary | ICD-10-CM | POA: Diagnosis present

## 2014-11-05 DIAGNOSIS — Z8739 Personal history of other diseases of the musculoskeletal system and connective tissue: Secondary | ICD-10-CM | POA: Diagnosis not present

## 2014-11-05 DIAGNOSIS — F419 Anxiety disorder, unspecified: Secondary | ICD-10-CM | POA: Insufficient documentation

## 2014-11-05 DIAGNOSIS — R350 Frequency of micturition: Secondary | ICD-10-CM | POA: Diagnosis not present

## 2014-11-05 DIAGNOSIS — I129 Hypertensive chronic kidney disease with stage 1 through stage 4 chronic kidney disease, or unspecified chronic kidney disease: Secondary | ICD-10-CM | POA: Insufficient documentation

## 2014-11-05 DIAGNOSIS — R110 Nausea: Secondary | ICD-10-CM | POA: Diagnosis not present

## 2014-11-05 DIAGNOSIS — Z9889 Other specified postprocedural states: Secondary | ICD-10-CM | POA: Insufficient documentation

## 2014-11-05 DIAGNOSIS — R05 Cough: Secondary | ICD-10-CM | POA: Insufficient documentation

## 2014-11-05 DIAGNOSIS — Z794 Long term (current) use of insulin: Secondary | ICD-10-CM | POA: Insufficient documentation

## 2014-11-05 DIAGNOSIS — R3915 Urgency of urination: Secondary | ICD-10-CM | POA: Insufficient documentation

## 2014-11-05 DIAGNOSIS — R197 Diarrhea, unspecified: Secondary | ICD-10-CM | POA: Diagnosis not present

## 2014-11-05 DIAGNOSIS — R739 Hyperglycemia, unspecified: Secondary | ICD-10-CM

## 2014-11-05 DIAGNOSIS — Z8669 Personal history of other diseases of the nervous system and sense organs: Secondary | ICD-10-CM | POA: Insufficient documentation

## 2014-11-05 DIAGNOSIS — I25110 Atherosclerotic heart disease of native coronary artery with unstable angina pectoris: Secondary | ICD-10-CM | POA: Insufficient documentation

## 2014-11-05 DIAGNOSIS — Z79899 Other long term (current) drug therapy: Secondary | ICD-10-CM | POA: Insufficient documentation

## 2014-11-05 DIAGNOSIS — Z9861 Coronary angioplasty status: Secondary | ICD-10-CM | POA: Diagnosis not present

## 2014-11-05 DIAGNOSIS — R1013 Epigastric pain: Secondary | ICD-10-CM | POA: Insufficient documentation

## 2014-11-05 DIAGNOSIS — N182 Chronic kidney disease, stage 2 (mild): Secondary | ICD-10-CM | POA: Diagnosis not present

## 2014-11-05 DIAGNOSIS — I480 Paroxysmal atrial fibrillation: Secondary | ICD-10-CM | POA: Diagnosis not present

## 2014-11-05 LAB — LIPASE, BLOOD: Lipase: 50 U/L (ref 22–51)

## 2014-11-05 LAB — URINALYSIS, ROUTINE W REFLEX MICROSCOPIC
BILIRUBIN URINE: NEGATIVE
Glucose, UA: 500 mg/dL — AB
Ketones, ur: 15 mg/dL — AB
Leukocytes, UA: NEGATIVE
NITRITE: NEGATIVE
Protein, ur: 30 mg/dL — AB
SPECIFIC GRAVITY, URINE: 1.021 (ref 1.005–1.030)
UROBILINOGEN UA: 0.2 mg/dL (ref 0.0–1.0)
pH: 5 (ref 5.0–8.0)

## 2014-11-05 LAB — CBC WITH DIFFERENTIAL/PLATELET
BASOS ABS: 0 10*3/uL (ref 0.0–0.1)
BASOS PCT: 0 % (ref 0–1)
Eosinophils Absolute: 0.2 10*3/uL (ref 0.0–0.7)
Eosinophils Relative: 2 % (ref 0–5)
HEMATOCRIT: 34.6 % — AB (ref 36.0–46.0)
HEMOGLOBIN: 11.5 g/dL — AB (ref 12.0–15.0)
LYMPHS PCT: 15 % (ref 12–46)
Lymphs Abs: 1.3 10*3/uL (ref 0.7–4.0)
MCH: 30 pg (ref 26.0–34.0)
MCHC: 33.2 g/dL (ref 30.0–36.0)
MCV: 90.3 fL (ref 78.0–100.0)
Monocytes Absolute: 0.7 10*3/uL (ref 0.1–1.0)
Monocytes Relative: 8 % (ref 3–12)
NEUTROS ABS: 6.3 10*3/uL (ref 1.7–7.7)
NEUTROS PCT: 75 % (ref 43–77)
Platelets: 287 10*3/uL (ref 150–400)
RBC: 3.83 MIL/uL — AB (ref 3.87–5.11)
RDW: 13.4 % (ref 11.5–15.5)
WBC: 8.5 10*3/uL (ref 4.0–10.5)

## 2014-11-05 LAB — CBG MONITORING, ED
GLUCOSE-CAPILLARY: 274 mg/dL — AB (ref 65–99)
Glucose-Capillary: 171 mg/dL — ABNORMAL HIGH (ref 65–99)

## 2014-11-05 LAB — COMPREHENSIVE METABOLIC PANEL
ALBUMIN: 3.2 g/dL — AB (ref 3.5–5.0)
ALT: 35 U/L (ref 14–54)
ANION GAP: 12 (ref 5–15)
AST: 24 U/L (ref 15–41)
Alkaline Phosphatase: 75 U/L (ref 38–126)
BUN: 23 mg/dL — ABNORMAL HIGH (ref 6–20)
CALCIUM: 10.7 mg/dL — AB (ref 8.9–10.3)
CO2: 22 mmol/L (ref 22–32)
Chloride: 100 mmol/L — ABNORMAL LOW (ref 101–111)
Creatinine, Ser: 1.18 mg/dL — ABNORMAL HIGH (ref 0.44–1.00)
GFR, EST AFRICAN AMERICAN: 48 mL/min — AB (ref >60)
GFR, EST NON AFRICAN AMERICAN: 41 mL/min — AB (ref >60)
Glucose, Bld: 260 mg/dL — ABNORMAL HIGH (ref 65–99)
Potassium: 4.6 mmol/L (ref 3.5–5.1)
Sodium: 134 mmol/L — ABNORMAL LOW (ref 135–145)
TOTAL PROTEIN: 6.6 g/dL (ref 6.5–8.1)
Total Bilirubin: 0.1 mg/dL — ABNORMAL LOW (ref 0.3–1.2)

## 2014-11-05 LAB — URINE MICROSCOPIC-ADD ON

## 2014-11-05 LAB — PROTIME-INR
INR: 3.17 — AB (ref 0.00–1.49)
Prothrombin Time: 31.9 seconds — ABNORMAL HIGH (ref 11.6–15.2)

## 2014-11-05 LAB — TROPONIN I: Troponin I: <0.03 ng/mL (ref <0.031)

## 2014-11-05 MED ORDER — HALOPERIDOL LACTATE 5 MG/ML IJ SOLN: 5.0000 mg | Freq: Once | INTRAMUSCULAR | Status: DC

## 2014-11-05 MED ORDER — SODIUM CHLORIDE 0.9 % IV BOLUS (SEPSIS)
1000.0000 mL | Freq: Once | INTRAVENOUS | Status: AC
  Administered 2014-11-05: 1000 mL via INTRAVENOUS

## 2014-11-05 NOTE — ED Notes (Signed)
Pt states that she started taking insulin August 17 and since then her glucose level has been climbing. States that it was 488 this morning. She also takes Metformin and [place].

## 2014-11-05 NOTE — ED Notes (Signed)
Pt assisted to bedside commode.  Pt requires some assistance to stand and pivot to bedside commode, but able to successfully use commode.

## 2014-11-05 NOTE — ED Notes (Signed)
MD at bedside. 

## 2014-11-05 NOTE — Discharge Instructions (Signed)

## 2014-11-05 NOTE — ED Notes (Signed)
Pt transported to xray 

## 2014-11-05 NOTE — ED Notes (Addendum)
Pt is eating. BP cuff taken off for the time being

## 2014-11-05 NOTE — ED Provider Notes (Signed)
CSN: [id number]     Arrival date & time 11/05/14  1524 History   First MD Initiated Contact with Patient 11/05/14 1609     Chief Complaint  Patient presents with  . Hyperglycemia   Patient is a 81 y.o. female presenting with general illness. The history is provided by the patient and a relative. No language interpreter was used.  Illness Location:  Generalized Quality:  Hyperglycemia Severity:  Moderate Onset quality:  Gradual Timing:  Constant Progression:  Worsening Chronicity:  Chronic Context:  PMHx of GERD, HLD, DMT2, tachy-brady syndrome, PAF, and CAD (stent x2) presenting today with hyperglycemia. Diabetes diagnosed 10 years ago and on Metformin and Glimepiride since then. Januvia added in spring of this year. Lantus added 3 weeks ago. 2 falls in last month (mechanical). Loose non-bloody diarrhea x1 week. Worsening hyperglycemia to 300 & 400's over past couple days.  Associated symptoms: abdominal pain (epigastric), cough (for years, unchanged), diarrhea (x1 week) and nausea   Associated symptoms: no fever, no shortness of breath and no vomiting     Past Medical History  Diagnosis Date  . History of unstable angina 06/13/2011    Jaw pain awakening from sleep -- Myoview --CATH --> PCI  . Abnormal nuclear stress test 06/21/2011    Inferolateral reversible defect;--> cardiac cath & PCI of CxOM, occluded RCA with collaterals;; followup Myoview 11/2012: Low risk. Fixed basal inferior artifact normal EF. No ischemia  . CAD S/P percutaneous coronary angioplasty     s/P PCI to proximal OM1 w/ DES; occluded RCA with bridging and L-R collaterals (medical management)  . Shortness of breath on exertion October 2013    2-D echo: Normal EF>55%, Gr 1DD, mild aortic sclerosis; Evaluated with CPET - peak VO2 97%; Chronotropic Incompetence (submaximal effort)  . Chronotropic incompetence with sinus node dysfunction October 2013    On CPET test; beta blockers reduced  . PAF (paroxysmal atrial  fibrillation) 10-11/ 2014    Card[employer]Net Event Monitor: NSR & S Brady -- Rates 50-100; Total A. fib burden 35 hours and 27 minutes. 1296 episodes, longest was 1 hour 29 minutes. Rate ranged from 52-169 beats per minute.  . Tachycardia-bradycardia syndrome 11/2012  . History of syncope     Per EP - neurocardiogenic & not Bradycardia related (no PPM)  . OSA (obstructive sleep apnea)     hx bladder infections  . Diabetes mellitus type 2, controlled     On oral medications  . Essential hypertension     Allowing for permissive hypertension to avoid orthostatic hypotension  . Hyperlipidemia with target LDL less than 70     HDL at goal, LDL not at goal, borderline triglycerides. On Crestor 20 mg  . Chronic kidney disease (CKD), stage II (mild)     Related to current bladder infections (although diabetes cannot be excluded)  . Spinal stenosis of lumbar region 11/2011  . Osteoarthritis   . GERD (gastroesophageal reflux disease)     On PPI  . Seasonal allergies   . Anxiety     When necessary Xanax  . Glaucoma   . Bilateral cataracts     Status post stroke or correction   Past Surgical History  Procedure Laterality Date  . Cataracts      both eyes  . Colonoscopy    . Vaginal hysterectomy    . Breast biopsy      both breast  . Cardiac catheterization  07/05/2011    Unstable Angina --> Myoview wiht Inf-Lat Ischemia.  Proximal  OM1 lesion --> PCI; 100% mid RCA with right to right and left to right bridging collaterals from circumflex RPL and LAD the PDA.  Marland Kitchen Coronary angioplasty with stent placement  07/05/2011    Promus Element DES 2.5 mm x 16 mm-post dilated to 2.65 mm CX-Proximal OM1  . Nm myoview ltd  October 2014     Low risk. Fixed basal inferior artifact normal EF. No ischemia --> (as compared to pre-PCI Myoview revealing inferolateral ischemia)  . Transthoracic echocardiogram  October 2013    Normal EF, >55%. No regional WMA, Grd 1 DD,  mildly sclerotic aortic valve without stenosis  .  Left heart catheterization with coronary angiogram N/A 07/06/2011    Procedure: LEFT HEART CATHETERIZATION WITH CORONARY ANGIOGRAM;  Surgeon: Leonie Man, MD;  Location: [facility] CATH LAB;  Service: Cardiovascular;  Laterality: N/A;  . Knee arthroscopy with medial menisectomy Right 06/24/2014    Procedure: RIGHT KNEE ARTHROSCOPY WITH partial lateral MENISECTOMY, abrasion chondroplasty of medial femoral condyle and patella, microfracture technique;  Surgeon: Latanya Maudlin, MD;  Location: [facility] ORS;  Service: Orthopedics;  Laterality: Right;   Family History  Problem Relation Age of Onset  . Colon cancer Neg Hx   . Esophageal cancer Neg Hx   . Stomach cancer Neg Hx   . COPD Mother   . Heart attack Father   . Heart disease Father   . Cancer Sister   . Cancer Sister   . Pulmonary fibrosis Mother   . Clotting disorder Sister   . Heart attack Sister   . Stroke Father    Social History  Substance Use Topics  . Smoking status: Never Smoker   . Smokeless tobacco: Never Used  . Alcohol Use: No   OB History    No data available      Review of Systems  Constitutional: Negative for fever, chills and appetite change.  Respiratory: Positive for cough (for years, unchanged). Negative for shortness of breath.   Gastrointestinal: Positive for nausea, abdominal pain (epigastric) and diarrhea (x1 week). Negative for vomiting, constipation, blood in stool, abdominal distention, anal bleeding and rectal pain.  Genitourinary: Positive for urgency and frequency. Negative for dysuria.  All other systems reviewed and are negative.    Allergies  Lisinopril; Losartan; and Sulfa antibiotics  Home Medications   Prior to Admission medications   Medication Sig Start Date End Date Taking? Authorizing Provider  ALPRAZolam Duanne Moron) 0.25 MG tablet Take 0.25 mg by mouth at bedtime as needed for sleep or anxiety.    Yes Historical Provider, MD  azelastine (ASTELIN) 137 MCG/SPRAY nasal spray Place 1 spray into both  nostrils every evening.  12/08/10  Yes Historical Provider, MD  cholecalciferol (VITAMIN D) 1000 UNITS tablet Take 1,000 Units by mouth daily.   Yes Historical Provider, MD  dexlansoprazole (DEXILANT) 60 MG capsule Take 60 mg by mouth daily.   Yes Historical Provider, MD  docusate sodium (COLACE) 100 MG capsule Take 100 mg by mouth at bedtime.    Yes Historical Provider, MD  fexofenadine (ALLEGRA) 180 MG tablet Take 180 mg by mouth daily.   Yes Historical Provider, MD  fish oil-omega-3 fatty acids 1000 MG capsule Take 1 g by mouth daily.    Yes Historical Provider, MD  insulin glargine (LANTUS) 100 unit/mL SOPN Inject 15 Units into the skin at bedtime.   Yes Historical Provider, MD  ipratropium (ATROVENT) 0.06 % nasal spray Place 1 spray into the nose at bedtime.  12/08/10  Yes Historical Provider,  MD  JANUVIA 50 MG tablet Take 1 tablet by mouth daily. 10/08/13  Yes Historical Provider, MD  Magnesium 500 MG CAPS Take 1 capsule by mouth daily.   Yes Historical Provider, MD  metFORMIN (GLUCOPHAGE) 1000 MG tablet Take 1,000 mg by mouth 2 (two) times daily with a meal.   Yes Historical Provider, MD  metoprolol tartrate (LOPRESSOR) 25 MG tablet Take 0.5 tablets (12.5 mg total) by mouth 2 (two) times daily. 07/30/14  Yes Leonie Man, MD  mirabegron ER (MYRBETRIQ) 25 MG TB24 tablet Take 25 mg by mouth daily.   Yes Historical Provider, MD  nitroGLYCERIN (NITROSTAT) 0.4 MG SL tablet Place 0.4 mg under the tongue every 5 (five) minutes as needed for chest pain.   Yes Historical Provider, MD  PARoxetine (PAXIL) 10 MG tablet Take 10 mg by mouth daily.  09/29/10  Yes Historical Provider, MD  rOPINIRole (REQUIP) 0.5 MG tablet TAKE 1 TABLET AFTER DINNER EACH NIGHT. 03/30/14  Yes Kathee Delton, MD  rosuvastatin (CRESTOR) 20 MG tablet Take 20 mg by mouth at bedtime.    Yes Historical Provider, MD  warfarin (COUMADIN) 5 MG tablet Take 5-7.5 mg by mouth daily. Take 5 mg daily except 7.5 mg on Monday's and Friday's   Yes  Historical Provider, MD   BP 159/71 mmHg  Pulse 74  Temp(Src) 98.2 F (36.8 C) (Oral)  Resp 20  Ht 5\' 4"  (1.626 m)  Wt 167 lb (75.751 kg)  BMI 28.65 kg/m2  SpO2 96%   Physical Exam  Constitutional: She is oriented to person, place, and time. No distress.  HENT:  Head: Normocephalic and atraumatic.  Eyes: Conjunctivae are normal. Pupils are equal, round, and reactive to light.  Neck: Normal range of motion. Neck supple.  Cardiovascular: Normal rate, regular rhythm and intact distal pulses.   Pulmonary/Chest: Effort normal and breath sounds normal.  Abdominal: Soft. Bowel sounds are normal. She exhibits no distension. There is no tenderness. There is no rebound.  Musculoskeletal: Normal range of motion.  Neurological: She is alert and oriented to person, place, and time.  Skin: Skin is warm and dry. She is not diaphoretic.    ED Course  Procedures   Labs Review Labs Reviewed  COMPREHENSIVE METABOLIC PANEL - Abnormal; Notable for the following:    Sodium 134 (*)    Chloride 100 (*)    Glucose, Bld 260 (*)    BUN 23 (*)    Creatinine, Ser 1.18 (*)    Calcium 10.7 (*)    Albumin 3.2 (*)    Total Bilirubin 0.1 (*)    GFR calc non Af Amer 41 (*)    GFR calc Af Amer 48 (*)    All other components within normal limits  CBC WITH DIFFERENTIAL/PLATELET - Abnormal; Notable for the following:    RBC 3.83 (*)    Hemoglobin 11.5 (*)    HCT 34.6 (*)    All other components within normal limits  PROTIME-INR - Abnormal; Notable for the following:    Prothrombin Time 31.9 (*)    INR 3.17 (*)    All other components within normal limits  CBG MONITORING, ED - Abnormal; Notable for the following:    Glucose-Capillary 274 (*)    All other components within normal limits  CBG MONITORING, ED - Abnormal; Notable for the following:    Glucose-Capillary 171 (*)    All other components within normal limits  LIPASE, BLOOD  TROPONIN I  URINALYSIS, ROUTINE W REFLEX  MICROSCOPIC (NOT AT  [facility])   Imaging Review Dg Chest 2 View  11/05/2014   CLINICAL DATA:  Hyperglycemia and dizziness today, history diabetes mellitus, hypertension, coronary disease post MI and angioplasty  EXAM: CHEST  2 VIEW  COMPARISON:  12/19/2012  FINDINGS: Upper normal size of cardiac silhouette.  Mediastinal contours and pulmonary vascularity normal.  Decreased lung volumes versus previous exam with mild RIGHT basilar atelectasis.  Mild central peribronchial thickening.  No pulmonary infiltrate, pleural effusion or pneumothorax.  Bones demineralized.  IMPRESSION: Bronchitic changes with RIGHT basilar atelectasis.   Electronically Signed   By: Lavonia Dana M.D.   On: 11/05/2014 17:19   I have personally reviewed and evaluated these images and lab results as part of my medical decision-making.   EKG Interpretation None      MDM  Ms. Anspaugh is an 81 yo female w/ PMHx of GERD, HLD, DMT2, tachy-brady syndrome, PAF, and CAD (stent x2) presenting today with hyperglycemia. Diabetes diagnosed 10 years ago and on Metformin and Glimepiride since then. Januvia added.in spring of this year. Lantus added 3 weeks ago. 2 falls in last month (mechanical). Loose non-bloody diarrhea x1 week. Worsening hyperglycemia to 300 & 400's over past couple days. Daughter notes confusion this morning - when questioning patient further about this she states that she was unable to clear on how to use the controller for the TV, she was able to read the numbers and the words on the controller for which button to press. Denies facial droop, slurred speech, or focal numbness weakness or tingling.  Exam above notable for elderly overweight female lying in stretcher in no acute distress. Afebrile. Heart rate 70s. Mild hypertension with SBP's and 150s to 160s. Not tachypneic. Breathing well on room air and maintaining oxygen saturations without supplemental oxygen. Abdomen with mild TTP to epigastrium without guarding or rebound. No CVA  tenderness  Glucose initially 274 with repeat I-131 following IV fluids. EKG showing no ST elevation/depression or T-wave changes. Troponin 0.03 x2. CMP relatively unremarkable aside from creatinine of 1.18 and BUN of 23. WBC 8.5. Hemoglobin 11.5. Lipase 50. INR 3.17. Chest x-ray showing bibasilar atelectasis but no focal consolidation or pleural effusion or pneumothorax.  Symptoms improved with IV fluids and Zofran. Likely related to gastritis versus gastroparesis versus hyperglycemia. Patient informed about elevated INR and will follow up with her PCP in the next 1-2 days to get repeat check. Patient will also discuss alternative glucose control regimen at that time. Patient discharged home in stable condition. Strict ED return cautions discussed. Patient and family understanding and agree with the plan no further questions or concerns time.  Patient care discussed with followed by my attending, Dr. Varney Biles  Final diagnoses:  Hyperglycemia  Epigastric pain    Mayer Camel, MD 11/05/14 0511  Varney Biles, MD 11/09/14 [phone]

## 2014-11-06 LAB — CBG MONITORING, ED: GLUCOSE-CAPILLARY: 273 mg/dL — AB (ref 65–99)

## 2014-11-11 ENCOUNTER — Ambulatory Visit (INDEPENDENT_AMBULATORY_CARE_PROVIDER_SITE_OTHER): Payer: Medicare Other

## 2014-11-11 DIAGNOSIS — I480 Paroxysmal atrial fibrillation: Secondary | ICD-10-CM

## 2014-11-11 DIAGNOSIS — I4891 Unspecified atrial fibrillation: Secondary | ICD-10-CM | POA: Diagnosis not present

## 2014-11-11 DIAGNOSIS — Z7901 Long term (current) use of anticoagulants: Secondary | ICD-10-CM

## 2014-11-11 LAB — POCT INR: INR: 3.3

## 2014-11-12 ENCOUNTER — Telehealth: Payer: Self-pay | Admitting: Cardiology

## 2014-11-12 NOTE — Telephone Encounter (Signed)
Received records from [facility] for appointment on 12/23/14 with Dr Ellyn Hack.  Records given to [facility] (medical records) for Dr Allison Quarry schedule on 12/23/14. lp

## 2014-11-25 ENCOUNTER — Ambulatory Visit (INDEPENDENT_AMBULATORY_CARE_PROVIDER_SITE_OTHER): Payer: Medicare Other

## 2014-11-25 DIAGNOSIS — I4891 Unspecified atrial fibrillation: Secondary | ICD-10-CM | POA: Diagnosis not present

## 2014-11-25 DIAGNOSIS — Z7901 Long term (current) use of anticoagulants: Secondary | ICD-10-CM | POA: Diagnosis not present

## 2014-11-25 DIAGNOSIS — I480 Paroxysmal atrial fibrillation: Secondary | ICD-10-CM | POA: Diagnosis not present

## 2014-11-25 LAB — POCT INR: INR: 1.9

## 2014-11-28 DIAGNOSIS — I639 Cerebral infarction, unspecified: Secondary | ICD-10-CM

## 2014-11-28 HISTORY — PX: TRANSTHORACIC ECHOCARDIOGRAM: SHX275

## 2014-11-28 HISTORY — DX: Cerebral infarction, unspecified: I63.9

## 2014-12-16 ENCOUNTER — Ambulatory Visit (INDEPENDENT_AMBULATORY_CARE_PROVIDER_SITE_OTHER): Payer: Medicare Other

## 2014-12-16 DIAGNOSIS — Z7901 Long term (current) use of anticoagulants: Secondary | ICD-10-CM | POA: Diagnosis not present

## 2014-12-16 DIAGNOSIS — I4891 Unspecified atrial fibrillation: Secondary | ICD-10-CM | POA: Diagnosis not present

## 2014-12-16 LAB — POCT INR: INR: 3.3

## 2014-12-23 ENCOUNTER — Ambulatory Visit (INDEPENDENT_AMBULATORY_CARE_PROVIDER_SITE_OTHER): Payer: Medicare Other | Admitting: Cardiology

## 2014-12-23 ENCOUNTER — Encounter: Payer: Self-pay | Admitting: Cardiology

## 2014-12-23 VITALS — BP 142/72 | HR 73 | Ht 64.0 in | Wt 163.5 lb

## 2014-12-23 DIAGNOSIS — I4589 Other specified conduction disorders: Secondary | ICD-10-CM | POA: Diagnosis not present

## 2014-12-23 DIAGNOSIS — Z7901 Long term (current) use of anticoagulants: Secondary | ICD-10-CM

## 2014-12-23 DIAGNOSIS — I495 Sick sinus syndrome: Secondary | ICD-10-CM

## 2014-12-23 DIAGNOSIS — Z9861 Coronary angioplasty status: Secondary | ICD-10-CM | POA: Diagnosis not present

## 2014-12-23 DIAGNOSIS — E785 Hyperlipidemia, unspecified: Secondary | ICD-10-CM

## 2014-12-23 DIAGNOSIS — I480 Paroxysmal atrial fibrillation: Secondary | ICD-10-CM

## 2014-12-23 DIAGNOSIS — I2510 Atherosclerotic heart disease of native coronary artery without angina pectoris: Secondary | ICD-10-CM

## 2014-12-23 DIAGNOSIS — I10 Essential (primary) hypertension: Secondary | ICD-10-CM

## 2014-12-23 NOTE — Progress Notes (Signed)
PCP: Morton Peters, MD  Clinic Note: Chief Complaint  Patient presents with  . other    6 month follow up. "doing well other than her right knee bothering her."   . Coronary Artery Disease  . Atrial Fibrillation    HPI: Abigail Boyd is a 81 y.o. female with a PMH below who presents today for 6 month f/u of CAD & PAF.Marland Kitchen She had a history of unstable angina back in 2013 underwent PCI to an OM.  She was also noted to have a chronically occluded RCA with collaterals. She suddenly developed atrial fibrillation with mild tachybradycardia syndrome. She has had at least one syncopal event they were not sure the true etiology. She was seen by Dr. Caryl Comes from electrophysiology who felt that she did not meet criteria for pacemaker.   [name] was last seen in April 2016 for pre-operative risk evaluation for knee surgery.  She was otherwise doing well.  Recent Hospitalizations: 06/24/14 R Knee Arthroscopic Sgx - for Meniscal tear- surgery went well, but still has trouble walking.Can't bear weight   Studies Reviewed: ED visit 11/05/14 -- noted to have Hyperglycemia & confusion, with N/V --> thought to be related to gastroparesis. Now N/V better - is on Lantus.    Interval History:  Is doing relatively well now following her recent hospital stay. Because of her being sick and because she's been adjusting her diet and try to exercise to get better control her diabetes she has lost about 7 pounds since her last visit. She has not had any further syncopal episodes or near syncopal episodes. No symptomatic coronary disease with no evidence of anginal symptoms with rest or exertion. No shortness of breath with rest or exertion.  No PND, orthopnea -- she does have occasional left-sided swelling this somewhat chronic for her. No palpitations, lightheadedness, dizziness. No TIA/amaurosis fugax symptoms. No claudication.  ROS: A comprehensive was performed. Review of Systems  Constitutional:  Negative for malaise/fatigue.  HENT: Negative for nosebleeds.   Gastrointestinal: Negative for blood in stool and melena.  Genitourinary: Negative for hematuria.  Musculoskeletal: Positive for joint pain (Mostly the right knee). Negative for falls.  Neurological: Negative for weakness and headaches.  Endo/Heme/Allergies: Does not bruise/bleed easily.  Psychiatric/Behavioral: Negative.   All other systems reviewed and are negative.    Past Medical History  Diagnosis Date  . History of unstable angina 06/13/2011    Jaw pain awakening from sleep -- Myoview --CATH --> PCI  . Abnormal nuclear stress test 06/21/2011    Inferolateral reversible defect;--> cardiac cath & PCI of CxOM, occluded RCA with collaterals;; followup Myoview 11/2012: Low risk. Fixed basal inferior artifact normal EF. No ischemia  . CAD S/P percutaneous coronary angioplasty     s/P PCI to proximal OM1 w/ DES; occluded RCA with bridging and L-R collaterals (medical management)  . Shortness of breath on exertion October 2013    2-D echo: Normal EF>55%, Gr 1DD, mild aortic sclerosis; Evaluated with CPET - peak VO2 97%; Chronotropic Incompetence (submaximal effort)  . Chronotropic incompetence with sinus node dysfunction [facility]) October 2013    On CPET test; beta blockers reduced  . PAF (paroxysmal atrial fibrillation) ([place]) 10-11/ 2014    CardioNet Event Monitor: NSR & S Brady -- Rates 50-100; Total A. fib burden 35 hours and 27 minutes. 1296 episodes, longest was 1 hour 29 minutes. Rate ranged from 52-169 beats per minute.  . Tachycardia-bradycardia syndrome ([place]) 11/2012  . History of syncope  Per EP - neurocardiogenic & not Bradycardia related (no PPM)  . OSA (obstructive sleep apnea)     hx bladder infections  . Diabetes mellitus type 2, controlled ([place])     On oral medications  . Essential hypertension     Allowing for permissive hypertension to avoid orthostatic hypotension  . Hyperlipidemia with target LDL less  than 70     HDL at goal, LDL not at goal, borderline triglycerides. On Crestor 20 mg  . Chronic kidney disease (CKD), stage II (mild)     Related to current bladder infections (although diabetes cannot be excluded)  . Spinal stenosis of lumbar region 11/2011  . Osteoarthritis   . GERD (gastroesophageal reflux disease)     On PPI  . Seasonal allergies   . Anxiety     When necessary Xanax  . Glaucoma   . Bilateral cataracts     Status post stroke or correction    Past Surgical History  Procedure Laterality Date  . Cataracts      both eyes  . Colonoscopy    . Vaginal hysterectomy    . Breast biopsy      both breast  . Cardiac catheterization  07/05/2011    Unstable Angina --> Myoview wiht Inf-Lat Ischemia.  Proximal OM1 lesion --> PCI; 100% mid RCA with right to right and left to right bridging collaterals from circumflex RPL and LAD the PDA.  Marland Kitchen Coronary angioplasty with stent placement  07/05/2011    Promus Element DES 2.5 mm x 16 mm-post dilated to 2.65 mm CX-Proximal OM1  . Nm myoview ltd  October 2014     Low risk. Fixed basal inferior artifact normal EF. No ischemia --> (as compared to pre-PCI Myoview revealing inferolateral ischemia)  . Transthoracic echocardiogram  October 2013    Normal EF, >55%. No regional WMA, Grd 1 DD,  mildly sclerotic aortic valve without stenosis  . Left heart catheterization with coronary angiogram N/A 07/06/2011    Procedure: LEFT HEART CATHETERIZATION WITH CORONARY ANGIOGRAM;  Surgeon: Leonie Man, MD;  Location: [facility] CATH LAB;  Service: Cardiovascular;  Laterality: N/A;  . Knee arthroscopy with medial menisectomy Right 06/24/2014    Procedure: RIGHT KNEE ARTHROSCOPY WITH partial lateral MENISECTOMY, abrasion chondroplasty of medial femoral condyle and patella, microfracture technique;  Surgeon: Latanya Maudlin, MD;  Location: [facility] ORS;  Service: Orthopedics;  Laterality: Right;    Prior to Admission medications   Medication Sig Start Date End Date  Taking? Authorizing Provider  ALPRAZolam Duanne Moron) 0.25 MG tablet Take 0.25 mg by mouth at bedtime as needed for sleep or anxiety.    Yes Historical Provider, MD  azelastine (ASTELIN) 137 MCG/SPRAY nasal spray Place 1 spray into both nostrils every evening.  12/08/10  Yes Historical Provider, MD  cholecalciferol (VITAMIN D) 1000 UNITS tablet Take 1,000 Units by mouth daily.   Yes Historical Provider, MD  dexlansoprazole (DEXILANT) 60 MG capsule Take 60 mg by mouth daily.   Yes Historical Provider, MD  docusate sodium (COLACE) 100 MG capsule Take 100 mg by mouth at bedtime.    Yes Historical Provider, MD  fexofenadine (ALLEGRA) 180 MG tablet Take 180 mg by mouth daily.   Yes Historical Provider, MD  fish oil-omega-3 fatty acids 1000 MG capsule Take 1 g by mouth daily.    Yes Historical Provider, MD  insulin glargine (LANTUS) 100 unit/mL SOPN Inject 36 Units into the skin at bedtime.    Yes Historical Provider, MD  insulin lispro (HUMALOG) 100  UNIT/ML KiwkPen Inject for SS only up to 10 u TDD 11/11/14  Yes Historical Provider, MD  ipratropium (ATROVENT) 0.06 % nasal spray Place 1 spray into the nose at bedtime.  12/08/10  Yes Historical Provider, MD  JANUVIA 50 MG tablet Take 1 tablet by mouth daily. 10/08/13  Yes Historical Provider, MD  Magnesium 500 MG CAPS Take 1 capsule by mouth daily.   Yes Historical Provider, MD  metFORMIN (GLUCOPHAGE) 1000 MG tablet Take 1,000 mg by mouth 2 (two) times daily with a meal.   Yes Historical Provider, MD  metoprolol tartrate (LOPRESSOR) 25 MG tablet Take 0.5 tablets (12.5 mg total) by mouth 2 (two) times daily. 07/30/14  Yes Leonie Man, MD  mirabegron ER (MYRBETRIQ) 25 MG TB24 tablet Take 25 mg by mouth daily.   Yes Historical Provider, MD  nitroGLYCERIN (NITROSTAT) 0.4 MG SL tablet Place 0.4 mg under the tongue every 5 (five) minutes as needed for chest pain.   Yes Historical Provider, MD  PARoxetine (PAXIL) 10 MG tablet Take 10 mg by mouth daily.  09/29/10  Yes  Historical Provider, MD  rOPINIRole (REQUIP) 0.5 MG tablet TAKE 1 TABLET AFTER DINNER EACH NIGHT. 03/30/14  Yes Kathee Delton, MD  rosuvastatin (CRESTOR) 20 MG tablet Take 20 mg by mouth at bedtime.    Yes Historical Provider, MD  warfarin (COUMADIN) 5 MG tablet Take 5-7.5 mg by mouth daily. Take 5 mg daily except 7.5 mg on Monday's and Friday's   Yes Historical Provider, MD   Allergies  Allergen Reactions  . Lisinopril Cough  . Losartan Cough  . Sulfa Antibiotics Hives    Social History   Social History  . Marital Status: Widowed    Spouse Name: N/A  . Number of Children: N/A  . Years of Education: N/A   Occupational History  . retired    Social History Main Topics  . Smoking status: Never Smoker   . Smokeless tobacco: Never Used  . Alcohol Use: No  . Drug Use: No  . Sexual Activity: No   Other Topics Concern  . None   Social History Narrative   Widowed mother of one, grandmother 2. Has not been using silver take as much lately due to knee discomfort. Usually exercises with Silver Sneakers on regular basis.   Family History  Problem Relation Age of Onset  . Colon cancer Neg Hx   . Esophageal cancer Neg Hx   . Stomach cancer Neg Hx   . COPD Mother   . Heart attack Father   . Heart disease Father   . Cancer Sister   . Cancer Sister   . Pulmonary fibrosis Mother   . Clotting disorder Sister   . Heart attack Sister   . Stroke Father     Wt Readings from Last 3 Encounters:  12/23/14 163 lb 8 oz (74.163 kg)  11/05/14 167 lb (75.751 kg)  06/24/14 170 lb 3.2 oz (77.202 kg)    PHYSICAL EXAM BP 142/72 mmHg  Pulse 73  Ht 5\' 4"  (1.626 m)  Wt 163 lb 8 oz (74.163 kg)  BMI 28.05 kg/m2 General appearance: alert, cooperative, appears stated age, no distress and Very pleasant mood and affect appeared well-nourished, well-groomed.  HEENT: no adenopathy, no carotid bruit, no JVD, supple, symmetrical, trachea midline and thyroid not enlarged, symmetric, no  tenderness/mass/nodules; [place]/AT, EOMI, MMM, anicteric sclera Lungs: CTA B., normal percussion bilaterally and Nonlabored, good air movement  Heart: RRR, S1, S2 normal, no S3 or  S4; 1/72medium pitch, C-D SEM at 2nd RUSB--> carotids and no rub  Abdomen: soft, non-tender; bowel sounds normal; no masses, no organomegaly  Extremities: extremities normal, atraumatic, no cyanosis or edema, no edema, redness or tenderness in the calves or thighs, no ulcers, gangrene or trophic changes and varicose veins noted  Pulses: 2+ and symmetric  Neurologic: Mental status: Alert, oriented, thought content appropriate, She does have some mild forgetfulness.  Cranial nerves: normal     Adult ECG Report  Rate: 73 ;  Rhythm: normal sinus rhythm and Normal axis, intervals and durations.;   Narrative Interpretation: normal EKG   Other studies Reviewed: Additional studies/ records that were reviewed today include:  Recent Labs:  06/18/2014 - Obtained from Care Everywhere  TC 160, TG 144, LDL 87, HDL 44  Creatinine 1.1. Potassium 4.1; LFTs normal.  ASSESSMENT / PLAN: Problem List Items Addressed This Visit    Tachy-brady syndrome: Rates range from 50-169 bpm in A. fib (Chronic)    No further bradycardic episodes. Heart rate is relatively stable with low-dose beta blocker.      PAF (paroxysmal atrial fibrillation) (HCC) (Chronic)    Maintaining sinus rhythm. Rate control with low-dose beta blocker. Would not further up titrate beta blocker due to the borderline for mitral incompetence. Need to monitor for symptomatic bradycardia. Anticoagulated with warfarin. This patients CHA2DS2-VASc Score and unadjusted Ischemic Stroke Rate (% per year) is equal to 9.7 % stroke rate/year from a score of 6  Above score calculated as 1 point each if present [CHF, HTN, DM, Vascular=MI/PAD/Aortic Plaque, Age if 65-74, or Female] Above score calculated as 2 points each if present [Age > 75, or Stroke/TIA/TE]        Relevant Orders   EKG 12-Lead (Completed)   Long term (current) use of anticoagulants (Chronic)   Hyperlipidemia with target LDL less than 70 (Chronic)    LDL not quite at goal based on current labs from springtime. She is on 20 of Crestor. Hopefully with her Crestor dosing.      Essential hypertension (Chronic)    She's had a history of possible neurocardiac syncope. Avoiding vasodilators and significant diuretics. A lot of her mild progressive hypertension.      Chronotropic incompetence - partially medication related (Chronic)    Able to adequately increase heart rate on current dose of beta blocker using treadmill stress testing. She does need some baseline inhalation on board due to history of bradycardia. Monitoring for symptomatic tachybradycardia syndrome      CAD-PCI OM1, 100% RCA (L-R colllaterals) - Primary (Chronic)    No recurrent anginal or heart failure symptoms. No significant exertional dyspnea. No plans for an ischemic evaluation in the absence of symptoms. She has not tolerated ACE inhibitor/ARB in the past. On beta blocker, and statin. Not on aspirin because she is on warfarin.      Relevant Orders   EKG 12-Lead (Completed)      Current medicines are reviewed at length with the patient today. (+/- concerns) none The following changes have been made: none Studies Ordered:   Orders Placed This Encounter  Procedures  . EKG 12-Lead    ROV 6 months   Khang Hannum, Leonie Green, M.D., M.S. Interventional Cardiologist   Pager # [phone]

## 2014-12-23 NOTE — Patient Instructions (Signed)
Medication Instructions:  Your physician recommends that you continue on your current medications as directed. Please refer to the Current Medication list given to you today.   Labwork: none  Testing/Procedures: none  Follow-Up: Your physician wants you to follow-up in: six months with Dr. Ellyn Hack.  You will receive a reminder letter in the mail two months in advance. If you don't receive a letter, please call our office to schedule the follow-up appointment.   Any Other Special Instructions Will Be Listed Below (If Applicable).     If you need a refill on your cardiac medications before your next appointment, please call your pharmacy.

## 2014-12-25 ENCOUNTER — Encounter: Payer: Self-pay | Admitting: Cardiology

## 2014-12-25 NOTE — Assessment & Plan Note (Signed)
LDL not quite at goal based on current labs from springtime. She is on 20 of Crestor. Hopefully with her Crestor dosing.

## 2014-12-25 NOTE — Assessment & Plan Note (Signed)
No recurrent anginal or heart failure symptoms. No significant exertional dyspnea. No plans for an ischemic evaluation in the absence of symptoms. She has not tolerated ACE inhibitor/ARB in the past. On beta blocker, and statin. Not on aspirin because she is on warfarin.

## 2014-12-25 NOTE — Assessment & Plan Note (Addendum)
Maintaining sinus rhythm. Rate control with low-dose beta blocker. Would not further up titrate beta blocker due to the borderline for mitral incompetence. Need to monitor for symptomatic bradycardia. Anticoagulated with warfarin. This patients CHA2DS2-VASc Score and unadjusted Ischemic Stroke Rate (% per year) is equal to 9.7 % stroke rate/year from a score of 6  Above score calculated as 1 point each if present [CHF, HTN, DM, Vascular=MI/PAD/Aortic Plaque, Age if 65-74, or Female] Above score calculated as 2 points each if present [Age > 75, or Stroke/TIA/TE]

## 2014-12-25 NOTE — Assessment & Plan Note (Signed)
She's had a history of possible neurocardiac syncope. Avoiding vasodilators and significant diuretics. A lot of her mild progressive hypertension.

## 2014-12-25 NOTE — Assessment & Plan Note (Signed)
No further bradycardic episodes. Heart rate is relatively stable with low-dose beta blocker.

## 2014-12-25 NOTE — Assessment & Plan Note (Signed)
Able to adequately increase heart rate on current dose of beta blocker using treadmill stress testing. She does need some baseline inhalation on board due to history of bradycardia. Monitoring for symptomatic tachybradycardia syndrome

## 2014-12-26 ENCOUNTER — Inpatient Hospital Stay (HOSPITAL_COMMUNITY)
Admission: EM | Admit: 2014-12-26 | Discharge: 2014-12-29 | DRG: 065 | Disposition: A | Discharge status: Skilled Nursing Facility | Payer: Medicare Other | Attending: Family Medicine | Admitting: Family Medicine

## 2014-12-26 ENCOUNTER — Inpatient Hospital Stay (HOSPITAL_COMMUNITY): Payer: Medicare Other

## 2014-12-26 ENCOUNTER — Emergency Department (HOSPITAL_COMMUNITY): Payer: Medicare Other

## 2014-12-26 DIAGNOSIS — E785 Hyperlipidemia, unspecified: Secondary | ICD-10-CM | POA: Diagnosis present

## 2014-12-26 DIAGNOSIS — Z7901 Long term (current) use of anticoagulants: Secondary | ICD-10-CM

## 2014-12-26 DIAGNOSIS — N183 Chronic kidney disease, stage 3 (moderate): Secondary | ICD-10-CM | POA: Diagnosis present

## 2014-12-26 DIAGNOSIS — Z794 Long term (current) use of insulin: Secondary | ICD-10-CM | POA: Diagnosis not present

## 2014-12-26 DIAGNOSIS — E1122 Type 2 diabetes mellitus with diabetic chronic kidney disease: Secondary | ICD-10-CM | POA: Diagnosis present

## 2014-12-26 DIAGNOSIS — R471 Dysarthria and anarthria: Secondary | ICD-10-CM | POA: Diagnosis present

## 2014-12-26 DIAGNOSIS — R29810 Facial weakness: Secondary | ICD-10-CM | POA: Diagnosis present

## 2014-12-26 DIAGNOSIS — E039 Hypothyroidism, unspecified: Secondary | ICD-10-CM | POA: Diagnosis present

## 2014-12-26 DIAGNOSIS — I480 Paroxysmal atrial fibrillation: Secondary | ICD-10-CM

## 2014-12-26 DIAGNOSIS — H409 Unspecified glaucoma: Secondary | ICD-10-CM | POA: Diagnosis present

## 2014-12-26 DIAGNOSIS — I6340 Cerebral infarction due to embolism of unspecified cerebral artery: Secondary | ICD-10-CM | POA: Diagnosis present

## 2014-12-26 DIAGNOSIS — G4733 Obstructive sleep apnea (adult) (pediatric): Secondary | ICD-10-CM | POA: Diagnosis not present

## 2014-12-26 DIAGNOSIS — I129 Hypertensive chronic kidney disease with stage 1 through stage 4 chronic kidney disease, or unspecified chronic kidney disease: Secondary | ICD-10-CM | POA: Diagnosis present

## 2014-12-26 DIAGNOSIS — M25561 Pain in right knee: Secondary | ICD-10-CM | POA: Diagnosis present

## 2014-12-26 DIAGNOSIS — Z79899 Other long term (current) drug therapy: Secondary | ICD-10-CM | POA: Diagnosis not present

## 2014-12-26 DIAGNOSIS — I495 Sick sinus syndrome: Secondary | ICD-10-CM

## 2014-12-26 DIAGNOSIS — Z955 Presence of coronary angioplasty implant and graft: Secondary | ICD-10-CM | POA: Diagnosis not present

## 2014-12-26 DIAGNOSIS — R4701 Aphasia: Secondary | ICD-10-CM | POA: Diagnosis present

## 2014-12-26 DIAGNOSIS — I639 Cerebral infarction, unspecified: Secondary | ICD-10-CM | POA: Diagnosis not present

## 2014-12-26 DIAGNOSIS — E1139 Type 2 diabetes mellitus with other diabetic ophthalmic complication: Secondary | ICD-10-CM | POA: Diagnosis present

## 2014-12-26 DIAGNOSIS — I6789 Other cerebrovascular disease: Secondary | ICD-10-CM | POA: Diagnosis not present

## 2014-12-26 DIAGNOSIS — E118 Type 2 diabetes mellitus with unspecified complications: Secondary | ICD-10-CM | POA: Diagnosis not present

## 2014-12-26 DIAGNOSIS — E119 Type 2 diabetes mellitus without complications: Secondary | ICD-10-CM | POA: Diagnosis present

## 2014-12-26 DIAGNOSIS — E049 Nontoxic goiter, unspecified: Secondary | ICD-10-CM | POA: Diagnosis present

## 2014-12-26 DIAGNOSIS — I63422 Cerebral infarction due to embolism of left anterior cerebral artery: Secondary | ICD-10-CM | POA: Diagnosis not present

## 2014-12-26 DIAGNOSIS — F329 Major depressive disorder, single episode, unspecified: Secondary | ICD-10-CM | POA: Diagnosis present

## 2014-12-26 DIAGNOSIS — I4891 Unspecified atrial fibrillation: Secondary | ICD-10-CM | POA: Diagnosis present

## 2014-12-26 DIAGNOSIS — K219 Gastro-esophageal reflux disease without esophagitis: Secondary | ICD-10-CM | POA: Diagnosis present

## 2014-12-26 DIAGNOSIS — N182 Chronic kidney disease, stage 2 (mild): Secondary | ICD-10-CM | POA: Diagnosis not present

## 2014-12-26 DIAGNOSIS — I2510 Atherosclerotic heart disease of native coronary artery without angina pectoris: Secondary | ICD-10-CM | POA: Diagnosis not present

## 2014-12-26 DIAGNOSIS — Z7984 Long term (current) use of oral hypoglycemic drugs: Secondary | ICD-10-CM | POA: Diagnosis not present

## 2014-12-26 DIAGNOSIS — I63032 Cerebral infarction due to thrombosis of left carotid artery: Secondary | ICD-10-CM | POA: Diagnosis not present

## 2014-12-26 DIAGNOSIS — I10 Essential (primary) hypertension: Secondary | ICD-10-CM | POA: Diagnosis not present

## 2014-12-26 DIAGNOSIS — F419 Anxiety disorder, unspecified: Secondary | ICD-10-CM | POA: Diagnosis present

## 2014-12-26 DIAGNOSIS — E1165 Type 2 diabetes mellitus with hyperglycemia: Secondary | ICD-10-CM | POA: Diagnosis present

## 2014-12-26 DIAGNOSIS — G8191 Hemiplegia, unspecified affecting right dominant side: Secondary | ICD-10-CM | POA: Diagnosis present

## 2014-12-26 LAB — DIFFERENTIAL
BASOS ABS: 0.1 10*3/uL (ref 0.0–0.1)
BASOS PCT: 1 %
Eosinophils Absolute: 0.1 10*3/uL (ref 0.0–0.7)
Eosinophils Relative: 1 %
LYMPHS PCT: 23 %
Lymphs Abs: 1.5 10*3/uL (ref 0.7–4.0)
Monocytes Absolute: 0.3 10*3/uL (ref 0.1–1.0)
Monocytes Relative: 5 %
NEUTROS ABS: 4.6 10*3/uL (ref 1.7–7.7)
NEUTROS PCT: 70 %

## 2014-12-26 LAB — COMPREHENSIVE METABOLIC PANEL
ALBUMIN: 3.7 g/dL (ref 3.5–5.0)
ALK PHOS: 53 U/L (ref 38–126)
ALK PHOS: 49 U/L (ref 38–126)
ALT: 17 U/L (ref 14–54)
ALT: 15 U/L (ref 14–54)
ANION GAP: 15 (ref 5–15)
AST: 20 U/L (ref 15–41)
AST: 23 U/L (ref 15–41)
Albumin: 3.5 g/dL (ref 3.5–5.0)
Anion gap: 11 (ref 5–15)
BILIRUBIN TOTAL: 0.4 mg/dL (ref 0.3–1.2)
BUN: 18 mg/dL (ref 6–20)
BUN: 20 mg/dL (ref 6–20)
CALCIUM: 9.7 mg/dL (ref 8.9–10.3)
CALCIUM: 9.9 mg/dL (ref 8.9–10.3)
CHLORIDE: 100 mmol/L — AB (ref 101–111)
CO2: 26 mmol/L (ref 22–32)
CO2: 23 mmol/L (ref 22–32)
CREATININE: 0.87 mg/dL (ref 0.44–1.00)
Chloride: 104 mmol/L (ref 101–111)
Creatinine, Ser: 0.98 mg/dL (ref 0.44–1.00)
GFR calc Af Amer: >60 mL/min (ref >60)
GFR calc non Af Amer: 60 mL/min — ABNORMAL LOW (ref >60)
GFR, EST NON AFRICAN AMERICAN: 52 mL/min — AB (ref >60)
GLUCOSE: 169 mg/dL — AB (ref 65–99)
GLUCOSE: 100 mg/dL — AB (ref 65–99)
POTASSIUM: 4.3 mmol/L (ref 3.5–5.1)
Potassium: 4.1 mmol/L (ref 3.5–5.1)
SODIUM: 137 mmol/L (ref 135–145)
Sodium: 142 mmol/L (ref 135–145)
TOTAL PROTEIN: 6.8 g/dL (ref 6.5–8.1)
Total Bilirubin: 0.2 mg/dL — ABNORMAL LOW (ref 0.3–1.2)
Total Protein: 6.9 g/dL (ref 6.5–8.1)

## 2014-12-26 LAB — CBC
HEMATOCRIT: 37.1 % (ref 36.0–46.0)
HEMOGLOBIN: 12.3 g/dL (ref 12.0–15.0)
MCH: 30.9 pg (ref 26.0–34.0)
MCHC: 33.2 g/dL (ref 30.0–36.0)
MCV: 93.2 fL (ref 78.0–100.0)
Platelets: 277 10*3/uL (ref 150–400)
RBC: 3.98 MIL/uL (ref 3.87–5.11)
RDW: 13 % (ref 11.5–15.5)
WBC: 6.5 10*3/uL (ref 4.0–10.5)

## 2014-12-26 LAB — I-STAT CHEM 8, ED
BUN: 21 mg/dL — AB (ref 6–20)
CALCIUM ION: 1.2 mmol/L (ref 1.13–1.30)
CHLORIDE: 101 mmol/L (ref 101–111)
Creatinine, Ser: 0.9 mg/dL (ref 0.44–1.00)
GLUCOSE: 168 mg/dL — AB (ref 65–99)
HCT: 41 % (ref 36.0–46.0)
Hemoglobin: 13.9 g/dL (ref 12.0–15.0)
POTASSIUM: 4.1 mmol/L (ref 3.5–5.1)
Sodium: 142 mmol/L (ref 135–145)
TCO2: 26 mmol/L (ref 0–100)

## 2014-12-26 LAB — CBC WITH DIFFERENTIAL/PLATELET
BASOS ABS: 0 10*3/uL (ref 0.0–0.1)
BASOS PCT: 0 %
EOS ABS: 0.1 10*3/uL (ref 0.0–0.7)
Eosinophils Relative: 2 %
HCT: 37.5 % (ref 36.0–46.0)
HEMOGLOBIN: 12.1 g/dL (ref 12.0–15.0)
Lymphocytes Relative: 24 %
Lymphs Abs: 2 10*3/uL (ref 0.7–4.0)
MCH: 30.3 pg (ref 26.0–34.0)
MCHC: 32.3 g/dL (ref 30.0–36.0)
MCV: 94 fL (ref 78.0–100.0)
MONOS PCT: 7 %
Monocytes Absolute: 0.6 10*3/uL (ref 0.1–1.0)
NEUTROS PCT: 67 %
Neutro Abs: 5.5 10*3/uL (ref 1.7–7.7)
Platelets: 270 10*3/uL (ref 150–400)
RBC: 3.99 MIL/uL (ref 3.87–5.11)
RDW: 13.2 % (ref 11.5–15.5)
WBC: 8.3 10*3/uL (ref 4.0–10.5)

## 2014-12-26 LAB — I-STAT TROPONIN, ED: Troponin i, poc: 0 ng/mL (ref 0.00–0.08)

## 2014-12-26 LAB — TROPONIN I

## 2014-12-26 LAB — APTT: APTT: 33 s (ref 24–37)

## 2014-12-26 LAB — PROTIME-INR
INR: 2.55 — AB (ref 0.00–1.49)
Prothrombin Time: 27.1 seconds — ABNORMAL HIGH (ref 11.6–15.2)

## 2014-12-26 LAB — GLUCOSE, CAPILLARY: GLUCOSE-CAPILLARY: 101 mg/dL — AB (ref 65–99)

## 2014-12-26 MED ORDER — LEVOCETIRIZINE DIHYDROCHLORIDE 5 MG PO TABS: 5.0000 mg | ORAL_TABLET | Freq: Every day | ORAL | Status: DC

## 2014-12-26 MED ORDER — MIRABEGRON ER 50 MG PO TB24
50.0000 mg | ORAL_TABLET | Freq: Every day | ORAL | Status: DC
  Administered 2014-12-26 – 2014-12-28 (×3): 50 mg via ORAL
  Filled 2014-12-26 (×5): qty 1

## 2014-12-26 MED ORDER — INSULIN ASPART 100 UNIT/ML ~~LOC~~ SOLN: 0.0000 [IU] | Freq: Three times a day (TID) | SUBCUTANEOUS | Status: DC

## 2014-12-26 MED ORDER — ACETAMINOPHEN 500 MG PO TABS
1000.0000 mg | ORAL_TABLET | Freq: Two times a day (BID) | ORAL | Status: DC | PRN
  Administered 2014-12-27 – 2014-12-29 (×2): 1000 mg via ORAL
  Filled 2014-12-26 (×2): qty 2

## 2014-12-26 MED ORDER — INSULIN ASPART 100 UNIT/ML ~~LOC~~ SOLN
0.0000 [IU] | Freq: Every day | SUBCUTANEOUS | Status: DC
  Administered 2014-12-27: 5 [IU] via SUBCUTANEOUS

## 2014-12-26 MED ORDER — HEPARIN SODIUM (PORCINE) 5000 UNIT/ML IJ SOLN: 5000.0000 [IU] | Freq: Three times a day (TID) | INTRAMUSCULAR | Status: DC

## 2014-12-26 MED ORDER — WARFARIN - PHARMACIST DOSING INPATIENT: Freq: Every day | Status: DC

## 2014-12-26 MED ORDER — SENNOSIDES-DOCUSATE SODIUM 8.6-50 MG PO TABS
1.0000 | ORAL_TABLET | Freq: Every evening | ORAL | Status: DC | PRN
  Filled 2014-12-26: qty 1

## 2014-12-26 MED ORDER — LATANOPROST 0.005 % OP SOLN
1.0000 [drp] | Freq: Every day | OPHTHALMIC | Status: DC
  Administered 2014-12-26 – 2014-12-29 (×3): 1 [drp] via OPHTHALMIC
  Filled 2014-12-26: qty 2.5

## 2014-12-26 MED ORDER — WARFARIN SODIUM 5 MG PO TABS
5.0000 mg | ORAL_TABLET | ORAL | Status: DC
  Administered 2014-12-26 – 2014-12-27 (×2): 5 mg via ORAL
  Filled 2014-12-26 (×3): qty 1

## 2014-12-26 MED ORDER — LORATADINE 10 MG PO TABS
10.0000 mg | ORAL_TABLET | Freq: Every day | ORAL | Status: DC
  Administered 2014-12-27 – 2014-12-28 (×2): 10 mg via ORAL
  Filled 2014-12-26 (×2): qty 1

## 2014-12-26 MED ORDER — AZELASTINE HCL 0.1 % NA SOLN
2.0000 | Freq: Every evening | NASAL | Status: DC | PRN
  Filled 2014-12-26: qty 30

## 2014-12-26 MED ORDER — STROKE: EARLY STAGES OF RECOVERY BOOK
Freq: Once | Status: AC
  Administered 2014-12-26: 1
  Filled 2014-12-26: qty 1

## 2014-12-26 MED ORDER — TIMOLOL MALEATE 0.5 % OP SOLN
1.0000 [drp] | Freq: Every day | OPHTHALMIC | Status: DC
  Administered 2014-12-27 – 2014-12-28 (×2): 1 [drp] via OPHTHALMIC
  Filled 2014-12-26: qty 5

## 2014-12-26 MED ORDER — PANTOPRAZOLE SODIUM 40 MG PO TBEC
40.0000 mg | DELAYED_RELEASE_TABLET | Freq: Every day | ORAL | Status: DC
  Administered 2014-12-27 – 2014-12-29 (×3): 40 mg via ORAL
  Filled 2014-12-26 (×3): qty 1

## 2014-12-26 MED ORDER — WARFARIN SODIUM 5 MG PO TABS: 5.0000 mg | ORAL_TABLET | Freq: Every day | ORAL | Status: DC

## 2014-12-26 MED ORDER — WARFARIN SODIUM 7.5 MG PO TABS: 7.5000 mg | ORAL_TABLET | ORAL | Status: DC

## 2014-12-26 MED ORDER — NITROGLYCERIN 0.4 MG SL SUBL: 0.4000 mg | SUBLINGUAL_TABLET | SUBLINGUAL | Status: DC | PRN

## 2014-12-26 NOTE — ED Notes (Signed)
Pt placed in a gown and hooked up to the monitor with a 5 lead, BP cuff and pulse ox 

## 2014-12-26 NOTE — ED Provider Notes (Signed)
CSN: [id number]     Arrival date & time 12/26/14  1413 History   First MD Initiated Contact with Patient 12/26/14 1658     Chief Complaint  Patient presents with  . Stroke Symptoms     HPI Patient presents to the emergency department complaining of abnormal speech over the past 4 days.  She's also had some right arm or right leg weakness.  No prior history of stroke.  She does have a history of hypertension hyperlipidemia.  She has a history of atrial fibrillation on Coumadin.  Her INR today is 2.5.  She denies headache.  No fevers or chills.  No neck pain.  No injury or trauma.  No chest pain or shortness breath.  Denies abdominal pain.  No other complaints.   Past Medical History  Diagnosis Date  . History of unstable angina 06/13/2011    Jaw pain awakening from sleep -- Myoview --CATH --> PCI  . Abnormal nuclear stress test 06/21/2011    Inferolateral reversible defect;--> cardiac cath & PCI of CxOM, occluded RCA with collaterals;; followup Myoview 11/2012: Low risk. Fixed basal inferior artifact normal EF. No ischemia  . CAD S/P percutaneous coronary angioplasty     s/P PCI to proximal OM1 w/ DES; occluded RCA with bridging and L-R collaterals (medical management)  . Shortness of breath on exertion October 2013    2-D echo: Normal EF>55%, Gr 1DD, mild aortic sclerosis; Evaluated with CPET - peak VO2 97%; Chronotropic Incompetence (submaximal effort)  . Chronotropic incompetence with sinus node dysfunction [facility]) October 2013    On CPET test; beta blockers reduced  . PAF (paroxysmal atrial fibrillation) ([place]) 10-11/ 2014    CardioNet Event Monitor: NSR & S Brady -- Rates 50-100; Total A. fib burden 35 hours and 27 minutes. 1296 episodes, longest was 1 hour 29 minutes. Rate ranged from 52-169 beats per minute.  . Tachycardia-bradycardia syndrome ([place]) 11/2012  . History of syncope     Per EP - neurocardiogenic & not Bradycardia related (no PPM)  . OSA (obstructive sleep apnea)     hx  bladder infections  . Diabetes mellitus type 2, controlled ([place])     On oral medications  . Essential hypertension     Allowing for permissive hypertension to avoid orthostatic hypotension  . Hyperlipidemia with target LDL less than 70     HDL at goal, LDL not at goal, borderline triglycerides. On Crestor 20 mg  . Chronic kidney disease (CKD), stage II (mild)     Related to current bladder infections (although diabetes cannot be excluded)  . Spinal stenosis of lumbar region 11/2011  . Osteoarthritis   . GERD (gastroesophageal reflux disease)     On PPI  . Seasonal allergies   . Anxiety     When necessary Xanax  . Glaucoma   . Bilateral cataracts     Status post stroke or correction   Past Surgical History  Procedure Laterality Date  . Cataracts      both eyes  . Colonoscopy    . Vaginal hysterectomy    . Breast biopsy      both breast  . Cardiac catheterization  07/05/2011    Unstable Angina --> Myoview wiht Inf-Lat Ischemia.  Proximal OM1 lesion --> PCI; 100% mid RCA with right to right and left to right bridging collaterals from circumflex RPL and LAD the PDA.  Marland Kitchen Coronary angioplasty with stent placement  07/05/2011    Promus Element DES 2.5 mm x 16 mm-post  dilated to 2.65 mm CX-Proximal OM1  . Nm myoview ltd  October 2014     Low risk. Fixed basal inferior artifact normal EF. No ischemia --> (as compared to pre-PCI Myoview revealing inferolateral ischemia)  . Transthoracic echocardiogram  October 2013    Normal EF, >55%. No regional WMA, Grd 1 DD,  mildly sclerotic aortic valve without stenosis  . Left heart catheterization with coronary angiogram N/A 07/06/2011    Procedure: LEFT HEART CATHETERIZATION WITH CORONARY ANGIOGRAM;  Surgeon: Leonie Man, MD;  Location: [facility] CATH LAB;  Service: Cardiovascular;  Laterality: N/A;  . Knee arthroscopy with medial menisectomy Right 06/24/2014    Procedure: RIGHT KNEE ARTHROSCOPY WITH partial lateral MENISECTOMY, abrasion chondroplasty of  medial femoral condyle and patella, microfracture technique;  Surgeon: Latanya Maudlin, MD;  Location: [facility] ORS;  Service: Orthopedics;  Laterality: Right;   Family History  Problem Relation Age of Onset  . Colon cancer Neg Hx   . Esophageal cancer Neg Hx   . Stomach cancer Neg Hx   . COPD Mother   . Heart attack Father   . Heart disease Father   . Cancer Sister   . Cancer Sister   . Pulmonary fibrosis Mother   . Clotting disorder Sister   . Heart attack Sister   . Stroke Father    Social History  Substance Use Topics  . Smoking status: Never Smoker   . Smokeless tobacco: Never Used  . Alcohol Use: No   OB History    No data available     Review of Systems  All other systems reviewed and are negative.     Allergies  Lisinopril; Losartan; and Sulfa antibiotics  Home Medications   Prior to Admission medications   Medication Sig Start Date End Date Taking? Authorizing Provider  acetaminophen (TYLENOL) 650 MG CR tablet Take 1,300 mg by mouth 2 (two) times daily as needed for pain.   Yes Historical Provider, MD  ALPRAZolam Duanne Moron) 0.25 MG tablet Take 0.25 mg by mouth at bedtime as needed for sleep or anxiety.    Yes Historical Provider, MD  AZELASTINE HCL NA Place 1 spray into both nostrils at bedtime as needed (allergies/cough).   Yes Historical Provider, MD  bimatoprost (LUMIGAN) 0.01 % SOLN Place 1 drop into both eyes at bedtime.   Yes Historical Provider, MD  cholecalciferol (VITAMIN D) 1000 UNITS tablet Take 1,000 Units by mouth daily.   Yes Historical Provider, MD  docusate sodium (COLACE) 100 MG capsule Take 100 mg by mouth daily as needed (constipation).    Yes Historical Provider, MD  fexofenadine (ALLEGRA) 180 MG tablet Take 180 mg by mouth at bedtime.    Yes Historical Provider, MD  fish oil-omega-3 fatty acids 1000 MG capsule Take 1 g by mouth daily.    Yes Historical Provider, MD  insulin glargine (LANTUS) 100 unit/mL SOPN Inject 36 Units into the skin at bedtime.     Yes Historical Provider, MD  insulin lispro (HUMALOG KWIKPEN) 100 UNIT/ML KiwkPen Inject 1 Units into the skin 3 (three) times daily as needed (CBG >200).   Yes Historical Provider, MD  ipratropium (ATROVENT) 0.06 % nasal spray Place 1 spray into the nose daily as needed (allergies/ congestion/cough).  12/08/10  Yes Historical Provider, MD  levocetirizine (XYZAL) 5 MG tablet Take 5 mg by mouth at bedtime. 12/19/14  Yes Historical Provider, MD  Magnesium 500 MG CAPS Take 500 mg by mouth daily as needed (constipation).    Yes Historical Provider,  MD  metFORMIN (GLUCOPHAGE) 1000 MG tablet Take 1,000 mg by mouth 2 (two) times daily with a meal.   Yes Historical Provider, MD  metoprolol tartrate (LOPRESSOR) 25 MG tablet Take 0.5 tablets (12.5 mg total) by mouth 2 (two) times daily. 07/30/14  Yes Leonie Man, MD  mirabegron ER (MYRBETRIQ) 50 MG TB24 tablet Take 50 mg by mouth at bedtime.    Yes Historical Provider, MD  nitroGLYCERIN (NITROSTAT) 0.4 MG SL tablet Place 0.4 mg under the tongue every 5 (five) minutes as needed for chest pain.   Yes Historical Provider, MD  nystatin cream (MYCOSTATIN) Apply 1 application topically daily. 12/03/14  Yes Historical Provider, MD  omeprazole (PRILOSEC) 20 MG capsule Take 20 mg by mouth daily. 11/25/14  Yes Historical Provider, MD  PARoxetine (PAXIL) 10 MG tablet Take 10 mg by mouth daily.  09/29/10  Yes Historical Provider, MD  rOPINIRole (REQUIP) 0.5 MG tablet TAKE 1 TABLET AFTER DINNER EACH NIGHT. Patient taking differently: TAKE 1 TABLET BY MOUTH DAILY AT BEDTIME 03/30/14  Yes Kathee Delton, MD  rosuvastatin (CRESTOR) 20 MG tablet Take 20 mg by mouth at bedtime.    Yes Historical Provider, MD  sitaGLIPtin (JANUVIA) 50 MG tablet Take 50 mg by mouth at bedtime.    Yes Historical Provider, MD  timolol (TIMOPTIC) 0.5 % ophthalmic solution Place 1 drop into both eyes daily.   Yes Historical Provider, MD  warfarin (COUMADIN) 5 MG tablet Take 5-7.5 mg by mouth at  bedtime. Take 1 1/2 tablets (7.5 mg) by mouth on Friday, take 1 tablet (5 mg) on Saturday, Sunday, Monday, Tuesday, Wednesday Thursday   Yes Historical Provider, MD   BP 194/86 mmHg  Pulse 71  Temp(Src) 98.3 F (36.8 C) (Oral)  Resp 14  Ht 5\' 4"  (1.626 m)  Wt 162 lb 14.4 oz (73.891 kg)  BMI 27.95 kg/m2  SpO2 97% Physical Exam  Constitutional: She is oriented to person, place, and time. She appears well-developed and well-nourished. No distress.  HENT:  Head: Normocephalic and atraumatic.  Eyes: EOM are normal.  Neck: Normal range of motion.  Cardiovascular: Normal rate, regular rhythm and normal heart sounds.   Pulmonary/Chest: Effort normal and breath sounds normal.  Abdominal: Soft. She exhibits no distension. There is no tenderness.  Musculoskeletal: Normal range of motion.  Neurological: She is alert and oriented to person, place, and time.  Dysarthric speech.  Right arm and right leg weakness as compared to left.  Skin: Skin is warm and dry.  Psychiatric: She has a normal mood and affect. Judgment normal.  Nursing note and vitals reviewed.   ED Course  Procedures (including critical care time) Labs Review Labs Reviewed  PROTIME-INR - Abnormal; Notable for the following:    Prothrombin Time 27.1 (*)    INR 2.55 (*)    All other components within normal limits  COMPREHENSIVE METABOLIC PANEL - Abnormal; Notable for the following:    Chloride 100 (*)    Glucose, Bld 169 (*)    Total Bilirubin 0.2 (*)    GFR calc non Af Amer 60 (*)    All other components within normal limits  I-STAT CHEM 8, ED - Abnormal; Notable for the following:    BUN 21 (*)    Glucose, Bld 168 (*)    All other components within normal limits  APTT  CBC  DIFFERENTIAL  I-STAT TROPOININ, ED    Imaging Review Ct Head Wo Contrast  12/26/2014  CLINICAL DATA:  Numbness  and weakness of the right hand and leg. Slurred speech. The onset of symptoms was 4 days ago. EXAM: CT HEAD WITHOUT CONTRAST  TECHNIQUE: Contiguous axial images were obtained from the base of the skull through the vertex without intravenous contrast. COMPARISON:  None. FINDINGS: Brain: No evidence of acute cortical infarction, hemorrhage, extra-axial collection, ventriculomegaly, or mass effect. There is moderate brain parenchymal atrophy and chronic small vessel disease changes. More focal area of hypoattenuation is seen in the left supratentorial subcortical white matter, underneath the central sulcus. Vascular: No hyperdense vessel or unexpected calcification. Skull: Negative for fracture or focal lesion. Sinuses/Orbits: No acute findings. Other: Pinial gland calcifications are seen. IMPRESSION: Area of hypoattenuation in the subcortical white matter of the left supratentorial brain. Acute infarction cannot be excluded. Further evaluation with MRI of the brain may be considered. Chronic brain parenchymal atrophy and deep white matter microvascular disease. These results were called by telephone at the time of interpretation on 12/26/2014 at 4:36 pm to Ms. Chrisco, RN from [facility] emergency department, who verbally acknowledged these results. Electronically Signed   By: Fidela Salisbury M.D.   On: 12/26/2014 16:40   I have personally reviewed and evaluated these images and lab results as part of my medical decision-making.   EKG Interpretation   Date/Time:  Saturday December 26 2014 14:34:42 EDT Ventricular Rate:  71 PR Interval:  170 QRS Duration: 80 QT Interval:  388 QTC Calculation: 421 R Axis:   28 Text Interpretation:  Normal sinus rhythm Normal ECG No significant change  was found Confirmed by Kolden Dupee  MD, Jacqui Headen ([id number]) on 12/26/2014 4:58:44 PM      MDM   Final diagnoses:  Cerebrovascular accident (CVA), unspecified mechanism ([place])    Clinically the patient had a stroke.  She has no abnormal head CT.  This likely represents subacute stroke.  Neurology consultation.  Hospitalist admission.  Stroke  swallow study.    Jola Schmidt, MD 12/26/14 [phone]

## 2014-12-26 NOTE — Consult Note (Addendum)
Referring Physician: Venora Maples    Chief Complaint: Difficulty with speech, right sided weakness  HPI: Abigail Boyd is an 81 y.o. female who was talking to her nephew mid-week and he noticed that her speech was slurred.  She is unclear when this started but her daughter recalls talking to her the night before at which time her speech was normal.  Later in the day noticed numbness in her right hand. On th next day dropped a coffee pot that she was trying to hold with her right hand.  Has also noticed that she was not moving her right leg as well.  She did have some previous knee problems that limited her but feels things are worse.  She did not want to come to the hospital but was encouraged by family to come today.    Date last known well: Date: 12/22/2014 Time last known well: Time: 23:00 tPA Given: No: Outside time window  MRankin: 0  Past Medical History  Diagnosis Date  . History of unstable angina 06/13/2011    Jaw pain awakening from sleep -- Myoview --CATH --> PCI  . Abnormal nuclear stress test 06/21/2011    Inferolateral reversible defect;--> cardiac cath & PCI of CxOM, occluded RCA with collaterals;; followup Myoview 11/2012: Low risk. Fixed basal inferior artifact normal EF. No ischemia  . CAD S/P percutaneous coronary angioplasty     s/P PCI to proximal OM1 w/ DES; occluded RCA with bridging and L-R collaterals (medical management)  . Shortness of breath on exertion October 2013    2-D echo: Normal EF>55%, Gr 1DD, mild aortic sclerosis; Evaluated with CPET - peak VO2 97%; Chronotropic Incompetence (submaximal effort)  . Chronotropic incompetence with sinus node dysfunction [facility]) October 2013    On CPET test; beta blockers reduced  . PAF (paroxysmal atrial fibrillation) ([place]) 10-11/ 2014    CardioNet Event Monitor: NSR & S Brady -- Rates 50-100; Total A. fib burden 35 hours and 27 minutes. 1296 episodes, longest was 1 hour 29 minutes. Rate ranged from 52-169 beats per minute.  .  Tachycardia-bradycardia syndrome ([place]) 11/2012  . History of syncope     Per EP - neurocardiogenic & not Bradycardia related (no PPM)  . OSA (obstructive sleep apnea)     hx bladder infections  . Diabetes mellitus type 2, controlled ([place])     On oral medications  . Essential hypertension     Allowing for permissive hypertension to avoid orthostatic hypotension  . Hyperlipidemia with target LDL less than 70     HDL at goal, LDL not at goal, borderline triglycerides. On Crestor 20 mg  . Chronic kidney disease (CKD), stage II (mild)     Related to current bladder infections (although diabetes cannot be excluded)  . Spinal stenosis of lumbar region 11/2011  . Osteoarthritis   . GERD (gastroesophageal reflux disease)     On PPI  . Seasonal allergies   . Anxiety     When necessary Xanax  . Glaucoma   . Bilateral cataracts     Status post stroke or correction    Past Surgical History  Procedure Laterality Date  . Cataracts      both eyes  . Colonoscopy    . Vaginal hysterectomy    . Breast biopsy      both breast  . Cardiac catheterization  07/05/2011    Unstable Angina --> Myoview wiht Inf-Lat Ischemia.  Proximal OM1 lesion --> PCI; 100% mid RCA with right to right and left  to right bridging collaterals from circumflex RPL and LAD the PDA.  Marland Kitchen Coronary angioplasty with stent placement  07/05/2011    Promus Element DES 2.5 mm x 16 mm-post dilated to 2.65 mm CX-Proximal OM1  . Nm myoview ltd  October 2014     Low risk. Fixed basal inferior artifact normal EF. No ischemia --> (as compared to pre-PCI Myoview revealing inferolateral ischemia)  . Transthoracic echocardiogram  October 2013    Normal EF, >55%. No regional WMA, Grd 1 DD,  mildly sclerotic aortic valve without stenosis  . Left heart catheterization with coronary angiogram N/A 07/06/2011    Procedure: LEFT HEART CATHETERIZATION WITH CORONARY ANGIOGRAM;  Surgeon: Leonie Man, MD;  Location: [facility] CATH LAB;  Service:  Cardiovascular;  Laterality: N/A;  . Knee arthroscopy with medial menisectomy Right 06/24/2014    Procedure: RIGHT KNEE ARTHROSCOPY WITH partial lateral MENISECTOMY, abrasion chondroplasty of medial femoral condyle and patella, microfracture technique;  Surgeon: Latanya Maudlin, MD;  Location: [facility] ORS;  Service: Orthopedics;  Laterality: Right;    Family History  Problem Relation Age of Onset  . Colon cancer Neg Hx   . Esophageal cancer Neg Hx   . Stomach cancer Neg Hx   . COPD Mother   . Heart attack Father   . Heart disease Father   . Cancer Sister   . Cancer Sister   . Pulmonary fibrosis Mother   . Clotting disorder Sister   . Heart attack Sister   . Stroke Father    Social History:  reports that she has never smoked. She has never used smokeless tobacco. She reports that she does not drink alcohol or use illicit drugs.  Allergies:  Allergies  Allergen Reactions  . Lisinopril Cough  . Losartan Cough  . Sulfa Antibiotics Hives    Medications: I have reviewed the patient's current medications. Prior to Admission:  Prior to Admission medications   Medication Sig Start Date End Date Taking? Authorizing Provider  bimatoprost (LUMIGAN) 0.01 % SOLN Place 1 drop into both eyes at bedtime.   Yes Historical Provider, MD  insulin glargine (LANTUS) 100 unit/mL SOPN Inject 36 Units into the skin at bedtime.    Yes Historical Provider, MD  insulin lispro (HUMALOG KWIKPEN) 100 UNIT/ML KiwkPen Inject 1 Units into the skin 3 (three) times daily as needed (CBG >200).   Yes Historical Provider, MD  metoprolol tartrate (LOPRESSOR) 25 MG tablet Take 0.5 tablets (12.5 mg total) by mouth 2 (two) times daily. 07/30/14  Yes Leonie Man, MD  mirabegron ER (MYRBETRIQ) 50 MG TB24 tablet Take 50 mg by mouth daily.   Yes Historical Provider, MD  PRESCRIPTION MEDICATION Place 1 drop into both eyes daily. Glaucoma eye drop from Dr. Sandra Cockayne [phone])   Yes Historical Provider, MD  sitaGLIPtin  (JANUVIA) 50 MG tablet Take 50 mg by mouth daily.   Yes Historical Provider, MD  warfarin (COUMADIN) 5 MG tablet Take 5-7.5 mg by mouth at bedtime. Take 1 1/2 tablets (7.5 mg) by mouth on Friday, take 1 tablet (5 mg) on Saturday, Sunday, Monday, Tuesday, Wednesday Thursday   Yes Historical Provider, MD  ALPRAZolam Duanne Moron) 0.25 MG tablet Take 0.25 mg by mouth at bedtime as needed for sleep or anxiety.     Historical Provider, MD  azelastine (ASTELIN) 137 MCG/SPRAY nasal spray Place 1 spray into both nostrils every evening.  12/08/10   Historical Provider, MD  cholecalciferol (VITAMIN D) 1000 UNITS tablet Take 1,000 Units by mouth daily.  Historical Provider, MD  dexlansoprazole (DEXILANT) 60 MG capsule Take 60 mg by mouth daily.    Historical Provider, MD  docusate sodium (COLACE) 100 MG capsule Take 100 mg by mouth at bedtime.     Historical Provider, MD  fexofenadine (ALLEGRA) 180 MG tablet Take 180 mg by mouth daily.    Historical Provider, MD  fish oil-omega-3 fatty acids 1000 MG capsule Take 1 g by mouth daily.     Historical Provider, MD  ipratropium (ATROVENT) 0.06 % nasal spray Place 1 spray into the nose at bedtime.  12/08/10   Historical Provider, MD  Magnesium 500 MG CAPS Take 1 capsule by mouth daily.    Historical Provider, MD  metFORMIN (GLUCOPHAGE) 1000 MG tablet Take 1,000 mg by mouth 2 (two) times daily with a meal.    Historical Provider, MD  nitroGLYCERIN (NITROSTAT) 0.4 MG SL tablet Place 0.4 mg under the tongue every 5 (five) minutes as needed for chest pain.    Historical Provider, MD  PARoxetine (PAXIL) 10 MG tablet Take 10 mg by mouth daily.  09/29/10   Historical Provider, MD  rOPINIRole (REQUIP) 0.5 MG tablet TAKE 1 TABLET AFTER DINNER EACH NIGHT. 03/30/14   Kathee Delton, MD  rosuvastatin (CRESTOR) 20 MG tablet Take 20 mg by mouth at bedtime.     Historical Provider, MD    ROS: History obtained from the patient  General ROS: negative for - chills, fatigue, fever, night  sweats, weight gain or weight loss Psychological ROS: negative for - behavioral disorder, hallucinations, memory difficulties, mood swings or suicidal ideation Ophthalmic ROS: negative for - blurry vision, double vision, eye pain or loss of vision ENT ROS: negative for - epistaxis, nasal discharge, oral lesions, sore throat, tinnitus or vertigo Allergy and Immunology ROS: negative for - hives or itchy/watery eyes Hematological and Lymphatic ROS: negative for - bleeding problems, bruising or swollen lymph nodes Endocrine ROS: negative for - galactorrhea, hair pattern changes, polydipsia/polyuria or temperature intolerance Respiratory ROS: negative for - cough, hemoptysis, shortness of breath or wheezing Cardiovascular ROS: negative for - chest pain, dyspnea on exertion, edema or irregular heartbeat Gastrointestinal ROS: negative for - abdominal pain, diarrhea, hematemesis, nausea/vomiting or stool incontinence Genito-Urinary ROS: negative for - dysuria, hematuria, incontinence or urinary frequency/urgency Musculoskeletal ROS: left neck pain, right knee pain Neurological ROS: as noted in HPI Dermatological ROS: negative for rash and skin lesion changes  Physical Examination: Blood pressure 183/80, pulse 78, temperature 98.3 F (36.8 C), temperature source Oral, resp. rate 18, height 5\' 4"  (1.626 m), weight 73.891 kg (162 lb 14.4 oz), SpO2 95 %.  HEENT-  Normocephalic, no lesions, without obvious abnormality.  Normal external eye and conjunctiva.  Normal TM's bilaterally.  Normal auditory canals and external ears. Normal external nose, mucus membranes and septum.  Normal pharynx. Cardiovascular- S1, S2 normal, pulses palpable throughout   Lungs- chest clear, no wheezing, rales, normal symmetric air entry Abdomen- soft, non-tender; bowel sounds normal; no masses,  no organomegaly Extremities- no edema Lymph-no adenopathy palpable Musculoskeletal-knee tenderness Skin-warm and dry, no  hyperpigmentation, vitiligo, or suspicious lesions  Neurological Examination Mental Status: Alert, oriented, thought content appropriate.  Speech fluent without evidence of aphasia.  Able to follow 3 step commands without difficulty. Cranial Nerves: II: Discs flat bilaterally; Visual fields grossly normal, pupils equal, round, reactive to light and accommodation III,IV, VI: ptosis not present, extra-ocular motions intact bilaterally V,VII: decrease in the right NLF, facial light touch sensation normal bilaterally VIII: hearing normal bilaterally  IX,X: gag reflex present XI: bilateral shoulder shrug XII: midline tongue extension Motor: Right : Upper extremity   5/5 with pronator drift and 5-/5 hand grip    Left:     Upper extremity   5/5  Lower extremity   3/5          Lower extremity   5/5 Tone and bulk:normal tone throughout; no atrophy noted Sensory: Pinprick and light touch decreased in the right hand Deep Tendon Reflexes: 2+ and symmetric throughout Plantars: Right: upgoing   Left: downgoing Cerebellar: normal finger-to-nose and normal heel-to-shin testing bilaterally Gait: not tested due to safety concerns      Laboratory Studies:  Basic Metabolic Panel:  Recent Labs Lab 12/26/14 1430 12/26/14 1441  NA 137 142  K 4.1 4.1  CL 100* 101  CO2 26  --   GLUCOSE 169* 168*  BUN 18 21*  CREATININE 0.87 0.90  CALCIUM 9.7  --     Liver Function Tests:  Recent Labs Lab 12/26/14 1430  AST 20  ALT 17  ALKPHOS 53  BILITOT 0.2*  PROT 6.9  ALBUMIN 3.7   No results for input(s): LIPASE, AMYLASE in the last 168 hours. No results for input(s): AMMONIA in the last 168 hours.  CBC:  Recent Labs Lab 12/26/14 1430 12/26/14 1441  WBC 6.5  --   NEUTROABS 4.6  --   HGB 12.3 13.9  HCT 37.1 41.0  MCV 93.2  --   PLT 277  --     Cardiac Enzymes: No results for input(s): CKTOTAL, CKMB, CKMBINDEX, TROPONINI in the last 168 hours.  BNP: Invalid input(s):  POCBNP  CBG: No results for input(s): GLUCAP in the last 168 hours.  Microbiology: No results found for this or any previous visit.  Coagulation Studies:  Recent Labs  12/26/14 1430  LABPROT 27.1*  INR 2.55*    Urinalysis: No results for input(s): COLORURINE, LABSPEC, PHURINE, GLUCOSEU, HGBUR, BILIRUBINUR, KETONESUR, PROTEINUR, UROBILINOGEN, NITRITE, LEUKOCYTESUR in the last 168 hours.  Invalid input(s): APPERANCEUR  Lipid Panel:    Component Value Date/Time   CHOL 177 12/18/2012 0941   TRIG 185* 12/18/2012 0941   HDL 50 12/18/2012 0941   CHOLHDL 3.5 12/18/2012 0941   LDLCALC 90 12/18/2012 0941    HgbA1C:  Lab Results  Component Value Date   HGBA1C 7.4* 12/18/2012    Urine Drug Screen:  No results found for: LABOPIA, COCAINSCRNUR, LABBENZ, AMPHETMU, THCU, LABBARB  Alcohol Level: No results for input(s): ETH in the last 168 hours.  Other results: EKG: normal sinus rhythm at 71 bpm.  Imaging: Ct Head Wo Contrast  12/26/2014  CLINICAL DATA:  Numbness and weakness of the right hand and leg. Slurred speech. The onset of symptoms was 4 days ago. EXAM: CT HEAD WITHOUT CONTRAST TECHNIQUE: Contiguous axial images were obtained from the base of the skull through the vertex without intravenous contrast. COMPARISON:  None. FINDINGS: Brain: No evidence of acute cortical infarction, hemorrhage, extra-axial collection, ventriculomegaly, or mass effect. There is moderate brain parenchymal atrophy and chronic small vessel disease changes. More focal area of hypoattenuation is seen in the left supratentorial subcortical white matter, underneath the central sulcus. Vascular: No hyperdense vessel or unexpected calcification. Skull: Negative for fracture or focal lesion. Sinuses/Orbits: No acute findings. Other: Pinial gland calcifications are seen. IMPRESSION: Area of hypoattenuation in the subcortical white matter of the left supratentorial brain. Acute infarction cannot be excluded.  Further evaluation with MRI of the brain may be considered. Chronic brain  parenchymal atrophy and deep white matter microvascular disease. These results were called by telephone at the time of interpretation on 12/26/2014 at 4:36 pm to Ms. Chrisco, RN from [facility] emergency department, who verbally acknowledged these results. Electronically Signed   By: Fidela Salisbury M.D.   On: 12/26/2014 16:40    Assessment: 81 y.o. female presenting with complaints of difficulty with speech and a mild right hemiparesis.  On Coumadin with an INR of 2.55.  Head CT personally reviewed and shows a left subcortical area of hypoattenuation.  Concern is for subacute infarct.  Further work up recommended.    Stroke Risk Factors - atrial fibrillation, diabetes mellitus, hyperlipidemia and hypertension  Plan: 1. HgbA1c, fasting lipid panel 2. MRI, MRA  of the brain without contrast 3. PT consult, OT consult, Speech consult 4. Echocardiogram 5. Carotid dopplers 6. Prophylactic therapy-Continue Coumadin 7. NPO until RN stroke swallow screen 8. Telemetry monitoring 9. Frequent neuro checks 10. BP control   Alexis Goodell, MD [facility] [phone] 12/26/2014, 5:49 PM

## 2014-12-26 NOTE — Progress Notes (Signed)
ANTICOAGULATION CONSULT NOTE - Initial Consult  Pharmacy Consult for warfarin Indication: atrial fibrillation  Allergies  Allergen Reactions  . Lisinopril Cough  . Losartan Cough  . Sulfa Antibiotics Hives    Patient Measurements: Height: 5\' 4"  (162.6 cm) Weight: 162 lb 14.4 oz (73.891 kg) IBW/kg (Calculated) : 54.7   Vital Signs: Temp: 98.3 F (36.8 C) (10/29 1424) Temp Source: Oral (10/29 1424) BP: 176/68 mmHg (10/29 1915) Pulse Rate: 67 (10/29 1915)  Labs:  Recent Labs  12/26/14 1430 12/26/14 1441  HGB 12.3 13.9  HCT 37.1 41.0  PLT 277  --   APTT 33  --   LABPROT 27.1*  --   INR 2.55*  --   CREATININE 0.87 0.90    Estimated Creatinine Clearance: 46.7 mL/min (by C-G formula based on Cr of 0.9).  Assessment: 81 yo female presenting with aphasia and right sided weakness  PMH: CAD, afib on warfarin, OSA, DM2, HTN, HLD, CKDII  AC: warfarin for afib. INR 2.55  PTA warfarin dose 7.5 mg Friday, 5 mg AOD  Neuro: CT shows left subcortical hypoattenuation - subacute infarct pending MRI  Renal: SCr 0.9  Heme: H&H 13.9/41, Plt 277  Goal of Therapy:  INR 2-3 Monitor platelets by anticoagulation protocol: Yes   Plan:  Warfarin 7.5 mg Friday and 5 mg AOD Daily INR, CBC q72h Monitor for s/sx of bleeding  Levester Fresh, PharmD, BCPS Clinical Pharmacist Pager [phone] 12/26/2014 8:10 PM

## 2014-12-26 NOTE — ED Notes (Signed)
This RN was about to take pt to floor and admitting MD stated that the pt had to go to MRI before coming to the floor.  Levada Dy RN from [facility] will be contacted by MRI when pt is done and will go directly to the floor.  Charge direct number to call is [phone]

## 2014-12-26 NOTE — H&P (Signed)
[name] History and Physical  [name]:[id number] DOB: [birth year] DOA: 12/26/2014   PCP: Morton Peters, MD    Chief Complaint: Right-sided weakness  HPI: Abigail Boyd is a 81 y.o. female WF PMHx Anxiety, Unstable Angina, CAD S/P angioplasty, Chronic incompetence with Sinus Node Dysfunction, Paroxysmal Atrial Fibrillation, Tachycardic-Bradycardia syndrome, HTN, HLD, OSA, Diabetes type 2 controlled, CKD stage II, glaucoma  Presents to the emergency department complaining of abnormal speech over the past 4 days. She's also had some right arm or right leg weakness. No prior history of stroke. She does have a history of hypertension hyperlipidemia. She has a history of atrial fibrillation on Coumadin. Her INR today is 2.5. She denies headache. No fevers or chills. No neck pain. No injury or trauma. No chest pain or shortness breath. Denies abdominal pain. No other complaints.  Patient states that had repair of meniscal tear in April, had been using cane/walker to ambulate. Starting 2 days ago became increasingly difficult to the point family members did not want patient to ambulate without help. Patient states was diagnosed with OSA (unsure of which physician) not using CPAP   Review of Systems: The patient denies anorexia, fever, weight loss,, vision loss, decreased hearing, hoarseness, chest pain,peripheral edema,hemoptysis, abdominal pain, melena, hematochezia, severe indigestion/heartburn, hematuria, incontinence, genital sores, suspicious skin lesions, transient blindness,depression, unusual weight change, abnormal bleeding, enlarged lymph nodes, angioedema, and breast masses.    TRAVEL HISTORY: NA   Consultants:  Barry Dienes (neuro hospitalist)   Procedure/Significant Events:  10/29 MRA/MRI brain;-Scattered areas of remote lacunar infarction -Acute LEFT frontal cortical and subcortical infarction, 10 x 15 mm,nonhemorrhagic - Intracranial  atherosclerotic change involves the distal anterior and middle cerebral artery branches  Culture  NA   Antibiotics:  NA   DVT prophylaxis:  Warfarin  Devices     LINES / TUBES:     Past Medical History  Diagnosis Date  . History of unstable angina 06/13/2011    Jaw pain awakening from sleep -- Myoview --CATH --> PCI  . Abnormal nuclear stress test 06/21/2011    Inferolateral reversible defect;--> cardiac cath & PCI of CxOM, occluded RCA with collaterals;; followup Myoview 11/2012: Low risk. Fixed basal inferior artifact normal EF. No ischemia  . CAD S/P percutaneous coronary angioplasty     s/P PCI to proximal OM1 w/ DES; occluded RCA with bridging and L-R collaterals (medical management)  . Shortness of breath on exertion October 2013    2-D echo: Normal EF>55%, Gr 1DD, mild aortic sclerosis; Evaluated with CPET - peak VO2 97%; Chronotropic Incompetence (submaximal effort)  . Chronotropic incompetence with sinus node dysfunction [facility]) October 2013    On CPET test; beta blockers reduced  . PAF (paroxysmal atrial fibrillation) ([place]) 10-11/ 2014    CardioNet Event Monitor: NSR & S Brady -- Rates 50-100; Total A. fib burden 35 hours and 27 minutes. 1296 episodes, longest was 1 hour 29 minutes. Rate ranged from 52-169 beats per minute.  . Tachycardia-bradycardia syndrome ([place]) 11/2012  . History of syncope     Per EP - neurocardiogenic & not Bradycardia related (no PPM)  . OSA (obstructive sleep apnea)     hx bladder infections  . Diabetes mellitus type 2, controlled ([place])     On oral medications  . Essential hypertension     Allowing for permissive hypertension to avoid orthostatic hypotension  . Hyperlipidemia with target LDL less than 70     HDL at goal, LDL not at goal,  borderline triglycerides. On Crestor 20 mg  . Chronic kidney disease (CKD), stage II (mild)     Related to current bladder infections (although diabetes cannot be excluded)  . Spinal stenosis of lumbar  region 11/2011  . Osteoarthritis   . GERD (gastroesophageal reflux disease)     On PPI  . Seasonal allergies   . Anxiety     When necessary Xanax  . Glaucoma   . Bilateral cataracts     Status post stroke or correction   Past Surgical History  Procedure Laterality Date  . Cataracts      both eyes  . Colonoscopy    . Vaginal hysterectomy    . Breast biopsy      both breast  . Cardiac catheterization  07/05/2011    Unstable Angina --> Myoview wiht Inf-Lat Ischemia.  Proximal OM1 lesion --> PCI; 100% mid RCA with right to right and left to right bridging collaterals from circumflex RPL and LAD the PDA.  Marland Kitchen Coronary angioplasty with stent placement  07/05/2011    Promus Element DES 2.5 mm x 16 mm-post dilated to 2.65 mm CX-Proximal OM1  . Nm myoview ltd  October 2014     Low risk. Fixed basal inferior artifact normal EF. No ischemia --> (as compared to pre-PCI Myoview revealing inferolateral ischemia)  . Transthoracic echocardiogram  October 2013    Normal EF, >55%. No regional WMA, Grd 1 DD,  mildly sclerotic aortic valve without stenosis  . Left heart catheterization with coronary angiogram N/A 07/06/2011    Procedure: LEFT HEART CATHETERIZATION WITH CORONARY ANGIOGRAM;  Surgeon: Leonie Man, MD;  Location: [facility] CATH LAB;  Service: Cardiovascular;  Laterality: N/A;  . Knee arthroscopy with medial menisectomy Right 06/24/2014    Procedure: RIGHT KNEE ARTHROSCOPY WITH partial lateral MENISECTOMY, abrasion chondroplasty of medial femoral condyle and patella, microfracture technique;  Surgeon: Latanya Maudlin, MD;  Location: [facility] ORS;  Service: Orthopedics;  Laterality: Right;   Social History:  reports that she has never smoked. She has never used smokeless tobacco. She reports that she does not drink alcohol or use illicit drugs. where does patient live--home, ALF, SNF? Home alone   Can patient participate in ADLs? Yes  Allergies  Allergen Reactions  . Lisinopril Cough  . Losartan Cough   . Sulfa Antibiotics Hives    Family History  Problem Relation Age of Onset  . Colon cancer Neg Hx   . Esophageal cancer Neg Hx   . Stomach cancer Neg Hx   . COPD Mother   . Heart attack Father   . Heart disease Father   . Cancer Sister   . Cancer Sister   . Pulmonary fibrosis Mother   . Clotting disorder Sister   . Heart attack Sister   . Stroke Father   Spoke with patient/daughter, no additional family history.     Prior to Admission medications   Medication Sig Start Date End Date Taking? Authorizing Provider  acetaminophen (TYLENOL) 650 MG CR tablet Take 1,300 mg by mouth 2 (two) times daily as needed for pain.   Yes Historical Provider, MD  ALPRAZolam Duanne Moron) 0.25 MG tablet Take 0.25 mg by mouth at bedtime as needed for sleep or anxiety.    Yes Historical Provider, MD  AZELASTINE HCL NA Place 1 spray into both nostrils at bedtime as needed (allergies/cough).   Yes Historical Provider, MD  bimatoprost (LUMIGAN) 0.01 % SOLN Place 1 drop into both eyes at bedtime.   Yes Historical Provider, MD  cholecalciferol (VITAMIN D) 1000 UNITS tablet Take 1,000 Units by mouth daily.   Yes Historical Provider, MD  docusate sodium (COLACE) 100 MG capsule Take 100 mg by mouth daily as needed (constipation).    Yes Historical Provider, MD  fexofenadine (ALLEGRA) 180 MG tablet Take 180 mg by mouth at bedtime.    Yes Historical Provider, MD  fish oil-omega-3 fatty acids 1000 MG capsule Take 1 g by mouth daily.    Yes Historical Provider, MD  insulin glargine (LANTUS) 100 unit/mL SOPN Inject 36 Units into the skin at bedtime.    Yes Historical Provider, MD  insulin lispro (HUMALOG KWIKPEN) 100 UNIT/ML KiwkPen Inject 1 Units into the skin 3 (three) times daily as needed (CBG >200).   Yes Historical Provider, MD  ipratropium (ATROVENT) 0.06 % nasal spray Place 1 spray into the nose daily as needed (allergies/ congestion/cough).  12/08/10  Yes Historical Provider, MD  levocetirizine (XYZAL) 5 MG  tablet Take 5 mg by mouth at bedtime. 12/19/14  Yes Historical Provider, MD  Magnesium 500 MG CAPS Take 500 mg by mouth daily as needed (constipation).    Yes Historical Provider, MD  metFORMIN (GLUCOPHAGE) 1000 MG tablet Take 1,000 mg by mouth 2 (two) times daily with a meal.   Yes Historical Provider, MD  metoprolol tartrate (LOPRESSOR) 25 MG tablet Take 0.5 tablets (12.5 mg total) by mouth 2 (two) times daily. 07/30/14  Yes Leonie Man, MD  mirabegron ER (MYRBETRIQ) 50 MG TB24 tablet Take 50 mg by mouth at bedtime.    Yes Historical Provider, MD  nitroGLYCERIN (NITROSTAT) 0.4 MG SL tablet Place 0.4 mg under the tongue every 5 (five) minutes as needed for chest pain.   Yes Historical Provider, MD  nystatin cream (MYCOSTATIN) Apply 1 application topically daily. 12/03/14  Yes Historical Provider, MD  omeprazole (PRILOSEC) 20 MG capsule Take 20 mg by mouth daily. 11/25/14  Yes Historical Provider, MD  PARoxetine (PAXIL) 10 MG tablet Take 10 mg by mouth daily.  09/29/10  Yes Historical Provider, MD  rOPINIRole (REQUIP) 0.5 MG tablet TAKE 1 TABLET AFTER DINNER EACH NIGHT. Patient taking differently: TAKE 1 TABLET BY MOUTH DAILY AT BEDTIME 03/30/14  Yes Kathee Delton, MD  rosuvastatin (CRESTOR) 20 MG tablet Take 20 mg by mouth at bedtime.    Yes Historical Provider, MD  sitaGLIPtin (JANUVIA) 50 MG tablet Take 50 mg by mouth at bedtime.    Yes Historical Provider, MD  timolol (TIMOPTIC) 0.5 % ophthalmic solution Place 1 drop into both eyes daily.   Yes Historical Provider, MD  warfarin (COUMADIN) 5 MG tablet Take 5-7.5 mg by mouth at bedtime. Take 1 1/2 tablets (7.5 mg) by mouth on Friday, take 1 tablet (5 mg) on Saturday, Sunday, Monday, Tuesday, Wednesday Thursday   Yes Historical Provider, MD   Physical Exam: Filed Vitals:   12/26/14 1915 12/26/14 2155 12/26/14 2359 12/27/14 0204  BP: 176/68 194/77 158/61 158/55  Pulse: 67 67    Temp:  98.1 F (36.7 C) 98.4 F (36.9 C) 98.1 F (36.7 C)  TempSrc:   Oral Oral Oral  Resp: 18 20 20    Height:      Weight:      SpO2: 95% 94% 94% 93%     General: A/O 4, NAD, No acute respiratory distress Eyes: Negative headache, eye pain, double vision, negative scleral hemorrhage ENT: Negative Runny nose, negative ear pain, negative tinnitus, negative gingival bleeding Neck:  Negative scars, masses, torticollis, lymphadenopathy, JVD Lungs: Clear  to auscultation bilaterally without wheezes or crackles Cardiovascular: Regular rate and rhythm without murmur gallop or rub normal S1 and S2 Abdomen:negative abdominal pain, negative dysphagia, Nontender, nondistended, soft, bowel sounds positive, no rebound, no ascites, no appreciable mass Extremities: No significant cyanosis, clubbing, or edema bilateral lower extremities Psychiatric:  Negative depression, negative anxiety, negative fatigue, negative mania Neurologic:  Cranial nerves II through XII intact, tongue/uvula midline, decrease strength in the muscles and neck pushing on hand to the right and lifting hand off the bed. RUE/RLE strength 3-4/5, LUE/LLE 5/5,sensation intact throughout, finger nose finger bilateral within normal limits (however patient clearly having more difficulty),, quick finger touch left hand WNL, unable to perform right hand, positive dysarthria, negative expressive aphasia, negative receptive aphasia.  Skin/Hemolytic/lymphatic; negative purpura, petechia,     Labs on Admission:  Basic Metabolic Panel:  Recent Labs Lab 12/26/14 1430 12/26/14 1441 12/26/14 1953  NA 137 142 142  K 4.1 4.1 4.3  CL 100* 101 104  CO2 26  --  23  GLUCOSE 169* 168* 100*  BUN 18 21* 20  CREATININE 0.87 0.90 0.98  CALCIUM 9.7  --  9.9   Liver Function Tests:  Recent Labs Lab 12/26/14 1430 12/26/14 1953  AST 20 23  ALT 17 15  ALKPHOS 53 49  BILITOT 0.2* 0.4  PROT 6.9 6.8  ALBUMIN 3.7 3.5   No results for input(s): LIPASE, AMYLASE in the last 168 hours. No results for input(s):  AMMONIA in the last 168 hours. CBC:  Recent Labs Lab 12/26/14 1430 12/26/14 1441 12/26/14 1953  WBC 6.5  --  8.3  NEUTROABS 4.6  --  5.5  HGB 12.3 13.9 12.1  HCT 37.1 41.0 37.5  MCV 93.2  --  94.0  PLT 277  --  270   Cardiac Enzymes:  Recent Labs Lab 12/26/14 1953  TROPONINI <0.03    BNP (last 3 results) No results for input(s): BNP in the last 8760 hours.  ProBNP (last 3 results) No results for input(s): PROBNP in the last 8760 hours.  CBG:  Recent Labs Lab 12/26/14 2218  GLUCAP 101*    Radiological Exams on Admission: Ct Head Wo Contrast  12/26/2014  CLINICAL DATA:  Numbness and weakness of the right hand and leg. Slurred speech. The onset of symptoms was 4 days ago. EXAM: CT HEAD WITHOUT CONTRAST TECHNIQUE: Contiguous axial images were obtained from the base of the skull through the vertex without intravenous contrast. COMPARISON:  None. FINDINGS: Brain: No evidence of acute cortical infarction, hemorrhage, extra-axial collection, ventriculomegaly, or mass effect. There is moderate brain parenchymal atrophy and chronic small vessel disease changes. More focal area of hypoattenuation is seen in the left supratentorial subcortical white matter, underneath the central sulcus. Vascular: No hyperdense vessel or unexpected calcification. Skull: Negative for fracture or focal lesion. Sinuses/Orbits: No acute findings. Other: Pinial gland calcifications are seen. IMPRESSION: Area of hypoattenuation in the subcortical white matter of the left supratentorial brain. Acute infarction cannot be excluded. Further evaluation with MRI of the brain may be considered. Chronic brain parenchymal atrophy and deep white matter microvascular disease. These results were called by telephone at the time of interpretation on 12/26/2014 at 4:36 pm to Ms. Chrisco, RN from [facility] emergency department, who verbally acknowledged these results. Electronically Signed   By: Fidela Salisbury M.D.   On:  12/26/2014 16:40   Mr Brain Wo Contrast  12/26/2014  CLINICAL DATA:  Speech difficulty and RIGHT hemiparesis. Symptoms began 4 days ago.  Patient is anticoagulated. EXAM: MRI HEAD WITHOUT CONTRAST MRA HEAD WITHOUT CONTRAST TECHNIQUE: Multiplanar, multiecho pulse sequences of the brain and surrounding structures were obtained without intravenous contrast. Angiographic images of the head were obtained using MRA technique without contrast. COMPARISON:  CT head earlier today. FINDINGS: MRI HEAD FINDINGS As suspected from CT, there is an ovoid 10 x 15 mm area of restricted diffusion representing acute infarction involving the LEFT frontal cortex and subcortical white matter. This is nonhemorrhagic. No other areas of acute infarction are seen. No mass lesion.  No extra-axial fluid. Global atrophy. Hydrocephalus ex vacuo. Extensive T2 and FLAIR hyperintensity throughout the white matter consistent with small vessel disease. Scattered areas of remote lacunar infarction. Flow is maintained in the internal carotid arteries, basilar artery, and both vertebral arteries. Unremarkable pituitary and cerebellar tonsils. No osseous findings. BILATERAL cataract extraction. No sinus or mastoid disease. Extracranial soft tissues unremarkable. MRA HEAD FINDINGS Minor non-stenotic irregularity of both internal carotid arteries. Basilar artery widely patent with vertebrals codominant, and widely patent. No significant proximal stenosis of the anterior, middle, or posterior cerebral arteries. No cerebellar branch occlusion. No intracranial aneurysm is evident. Moderately diseased RIGHT anterior cerebral artery in its distal A2 segment. Moderately diseased BILATERAL middle cerebral artery M3 branches, which on the LEFT could contribute to the observed pattern of infarction. IMPRESSION: Acute LEFT frontal cortical and subcortical infarction, 10 x 15 mm, nonhemorrhagic. Severe atrophy with extensive small vessel disease. No proximal large  vessel stenosis or occlusion. Intracranial atherosclerotic change involves the distal anterior and middle cerebral artery branches. Electronically Signed   By: Staci Righter M.D.   On: 12/26/2014 21:35   Mr Jodene Nam Head/brain Wo Cm  12/26/2014  CLINICAL DATA:  Speech difficulty and RIGHT hemiparesis. Symptoms began 4 days ago. Patient is anticoagulated. EXAM: MRI HEAD WITHOUT CONTRAST MRA HEAD WITHOUT CONTRAST TECHNIQUE: Multiplanar, multiecho pulse sequences of the brain and surrounding structures were obtained without intravenous contrast. Angiographic images of the head were obtained using MRA technique without contrast. COMPARISON:  CT head earlier today. FINDINGS: MRI HEAD FINDINGS As suspected from CT, there is an ovoid 10 x 15 mm area of restricted diffusion representing acute infarction involving the LEFT frontal cortex and subcortical white matter. This is nonhemorrhagic. No other areas of acute infarction are seen. No mass lesion.  No extra-axial fluid. Global atrophy. Hydrocephalus ex vacuo. Extensive T2 and FLAIR hyperintensity throughout the white matter consistent with small vessel disease. Scattered areas of remote lacunar infarction. Flow is maintained in the internal carotid arteries, basilar artery, and both vertebral arteries. Unremarkable pituitary and cerebellar tonsils. No osseous findings. BILATERAL cataract extraction. No sinus or mastoid disease. Extracranial soft tissues unremarkable. MRA HEAD FINDINGS Minor non-stenotic irregularity of both internal carotid arteries. Basilar artery widely patent with vertebrals codominant, and widely patent. No significant proximal stenosis of the anterior, middle, or posterior cerebral arteries. No cerebellar branch occlusion. No intracranial aneurysm is evident. Moderately diseased RIGHT anterior cerebral artery in its distal A2 segment. Moderately diseased BILATERAL middle cerebral artery M3 branches, which on the LEFT could contribute to the observed  pattern of infarction. IMPRESSION: Acute LEFT frontal cortical and subcortical infarction, 10 x 15 mm, nonhemorrhagic. Severe atrophy with extensive small vessel disease. No proximal large vessel stenosis or occlusion. Intracranial atherosclerotic change involves the distal anterior and middle cerebral artery branches. Electronically Signed   By: Staci Righter M.D.   On: 12/26/2014 21:35    EKG: NSR   Assessment/Plan Active Problems:   Stroke (  cerebrum) (HCC)   CVA (cerebral infarction)   CAD in native artery   Cerebrovascular accident (CVA) ([place])   CKD (chronic kidney disease), stage II   HLD (hyperlipidemia)   OSA (obstructive sleep apnea)   Paroxysmal atrial fibrillation (HCC)   Tachycardia-bradycardia syndrome ([place])   Uncontrolled type 2 diabetes mellitus with complication, with long-term current use of insulin (HCC)  CVA unspecified mechanism -Acute stroke see MRI findings  -NPO until passes swallow eval -Neuro checks/vitals q 2hr  -PT/OT consult evaluate for CIR vs SNF   Tachycardic-Bradycardic syn/Sinus node dysfunction -Currently in NSR, monitor closely  Paroxysmal atrial fibrillation -NSR -Patient currently therapeutic on Coumadin will have pharmacy manage while hospitalized.  CAD native artery -  HTN -Allow permissive HTN; SBP goal 145-165  HLD -Lipid panel pending -Patient will be started on Lipitor when able to take PO medication  OSA -CPAP per respiratory -Counseled patient on sequela of not using C Pap to include MI, CVA, and death  CKD stage II -Cr WNL  DM Type 2 Uncontrolled -Hemoglobin A1c pending -NovoLog sensitive SSI        Code Status: Full Family Communication: Daughter present during exam Disposition Plan: Neurology; CIR vs SNF   Care during the described time interval was provided by me .  I have reviewed this patient's available data, including medical history, events of note, physical examination, and all test results as part  of my evaluation. I have personally reviewed and interpreted all radiology studies.    Time spent: 90 minutes    Allie Bossier [name] Pager [phone]  If 7PM-7AM, please contact night-coverage [email or web address] Password [email or web address] 12/27/2014, 3:59 AM

## 2014-12-26 NOTE — ED Notes (Signed)
She c/o numbness/weakness in R hand/leg and slurred speech since Wednesday, symptoms are getting worse since onset. She is alert, R grips weaker than L, R arm drift noted. States it feels like she can not get the right words out. she c/o R knee pain since surgery in April.

## 2014-12-26 NOTE — Progress Notes (Signed)
Pt refused, stating she does not wear cpap at home. RT informed pt to call for RT if she changes her mind during the night

## 2014-12-26 NOTE — ED Notes (Signed)
Attempted report, 74M RN to call back.

## 2014-12-27 ENCOUNTER — Other Ambulatory Visit: Payer: Self-pay

## 2014-12-27 ENCOUNTER — Inpatient Hospital Stay (HOSPITAL_COMMUNITY): Payer: Medicare Other

## 2014-12-27 DIAGNOSIS — Z794 Long term (current) use of insulin: Secondary | ICD-10-CM

## 2014-12-27 DIAGNOSIS — I495 Sick sinus syndrome: Secondary | ICD-10-CM | POA: Diagnosis present

## 2014-12-27 DIAGNOSIS — I2510 Atherosclerotic heart disease of native coronary artery without angina pectoris: Secondary | ICD-10-CM | POA: Diagnosis present

## 2014-12-27 DIAGNOSIS — N182 Chronic kidney disease, stage 2 (mild): Secondary | ICD-10-CM

## 2014-12-27 DIAGNOSIS — I63422 Cerebral infarction due to embolism of left anterior cerebral artery: Secondary | ICD-10-CM

## 2014-12-27 DIAGNOSIS — E1165 Type 2 diabetes mellitus with hyperglycemia: Secondary | ICD-10-CM

## 2014-12-27 DIAGNOSIS — I4891 Unspecified atrial fibrillation: Secondary | ICD-10-CM | POA: Diagnosis present

## 2014-12-27 DIAGNOSIS — E785 Hyperlipidemia, unspecified: Secondary | ICD-10-CM

## 2014-12-27 DIAGNOSIS — I639 Cerebral infarction, unspecified: Secondary | ICD-10-CM | POA: Diagnosis present

## 2014-12-27 DIAGNOSIS — G4733 Obstructive sleep apnea (adult) (pediatric): Secondary | ICD-10-CM | POA: Diagnosis present

## 2014-12-27 DIAGNOSIS — E119 Type 2 diabetes mellitus without complications: Secondary | ICD-10-CM | POA: Diagnosis present

## 2014-12-27 DIAGNOSIS — E1122 Type 2 diabetes mellitus with diabetic chronic kidney disease: Secondary | ICD-10-CM | POA: Diagnosis present

## 2014-12-27 DIAGNOSIS — E118 Type 2 diabetes mellitus with unspecified complications: Secondary | ICD-10-CM

## 2014-12-27 LAB — GLUCOSE, CAPILLARY
GLUCOSE-CAPILLARY: 251 mg/dL — AB (ref 65–99)
Glucose-Capillary: 212 mg/dL — ABNORMAL HIGH (ref 65–99)
Glucose-Capillary: 267 mg/dL — ABNORMAL HIGH (ref 65–99)
Glucose-Capillary: 373 mg/dL — ABNORMAL HIGH (ref 65–99)

## 2014-12-27 LAB — LIPID PANEL
Cholesterol: 163 mg/dL (ref 0–200)
HDL: 39 mg/dL — ABNORMAL LOW (ref >40)
LDL CALC: 98 mg/dL (ref 0–99)
TRIGLYCERIDES: 131 mg/dL (ref <150)
Total CHOL/HDL Ratio: 4.2 RATIO
VLDL: 26 mg/dL (ref 0–40)

## 2014-12-27 LAB — PROTIME-INR
INR: 2.53 — ABNORMAL HIGH (ref 0.00–1.49)
PROTHROMBIN TIME: 26.9 s — AB (ref 11.6–15.2)

## 2014-12-27 MED ORDER — PAROXETINE HCL 20 MG PO TABS
10.0000 mg | ORAL_TABLET | Freq: Every day | ORAL | Status: DC
  Administered 2014-12-27 – 2014-12-29 (×3): 10 mg via ORAL
  Filled 2014-12-27 (×3): qty 1

## 2014-12-27 MED ORDER — METOPROLOL TARTRATE 1 MG/ML IV SOLN
5.0000 mg | Freq: Once | INTRAVENOUS | Status: AC
  Administered 2014-12-27: 5 mg via INTRAVENOUS
  Filled 2014-12-27: qty 5

## 2014-12-27 MED ORDER — LORAZEPAM 2 MG/ML IJ SOLN: 0.5000 mg | Freq: Once | INTRAMUSCULAR | Status: DC

## 2014-12-27 MED ORDER — DILTIAZEM HCL 100 MG IV SOLR
5.0000 mg/h | INTRAVENOUS | Status: DC
  Administered 2014-12-27: 5 mg/h via INTRAVENOUS
  Filled 2014-12-27: qty 100

## 2014-12-27 MED ORDER — METOPROLOL TARTRATE 12.5 MG HALF TABLET
12.5000 mg | ORAL_TABLET | Freq: Two times a day (BID) | ORAL | Status: DC
Start: 2014-12-27 — End: 2014-12-29
  Administered 2014-12-27 – 2014-12-29 (×5): 12.5 mg via ORAL
  Filled 2014-12-27 (×5): qty 1

## 2014-12-27 MED ORDER — ROSUVASTATIN CALCIUM 10 MG PO TABS: 20.0000 mg | ORAL_TABLET | Freq: Every day | ORAL | Status: DC

## 2014-12-27 MED ORDER — ROSUVASTATIN CALCIUM 10 MG PO TABS
40.0000 mg | ORAL_TABLET | Freq: Every day | ORAL | Status: DC
  Administered 2014-12-27 – 2014-12-28 (×2): 40 mg via ORAL
  Filled 2014-12-27 (×2): qty 4

## 2014-12-27 MED ORDER — INSULIN GLARGINE 100 UNIT/ML ~~LOC~~ SOLN
20.0000 [IU] | Freq: Every day | SUBCUTANEOUS | Status: DC
  Administered 2014-12-27: 20 [IU] via SUBCUTANEOUS
  Filled 2014-12-27 (×2): qty 0.2

## 2014-12-27 MED ORDER — ALPRAZOLAM 0.25 MG PO TABS
0.2500 mg | ORAL_TABLET | Freq: Every evening | ORAL | Status: DC | PRN
  Administered 2014-12-29: 0.25 mg via ORAL
  Filled 2014-12-27: qty 1

## 2014-12-27 MED ORDER — INSULIN ASPART 100 UNIT/ML ~~LOC~~ SOLN
0.0000 [IU] | SUBCUTANEOUS | Status: DC
  Administered 2014-12-27: 5 [IU] via SUBCUTANEOUS
  Administered 2014-12-27: 3 [IU] via SUBCUTANEOUS
  Administered 2014-12-27: 5 [IU] via SUBCUTANEOUS

## 2014-12-27 NOTE — Progress Notes (Signed)
Pt refuses cpap at this time.  RT will continue to monitor.

## 2014-12-27 NOTE — Progress Notes (Signed)
Utilization review completed.  

## 2014-12-27 NOTE — Progress Notes (Signed)
STROKE TEAM PROGRESS NOTE   HISTORY Abigail Boyd is an 81 y.o. female who was talking to her nephew mid-week and he noticed that her speech was slurred. She is unclear when this started but her daughter recalls talking to her the night before at which time her speech was normal. Later in the day noticed numbness in her right hand. On th next day dropped a coffee pot that she was trying to hold with her right hand. Has also noticed that she was not moving her right leg as well. She did have some previous knee problems that limited her but feels things are worse. She did not want to come to the hospital but was encouraged by family to come today.   Date last known well: Date: 12/22/2014 Time last known well: Time: 23:00 tPA Given: No: Outside time window   SUBJECTIVE (INTERVAL HISTORY) Her sister and other family members are at the bedside.  Overall she feels her condition is unchanged. She had no complaints for the full ROS except for right knee pain.  Patient undergoing carotid US at time of my exam.  Tech paused to allow for assessment  OBJECTIVE Temp:  [97.9 F (36.6 C)-98.6 F (37 C)] 97.9 F (36.6 C) (10/30 0600) Pulse Rate:  [66-129] 66 (10/30 0959) Cardiac Rhythm:  [-] Normal sinus rhythm (10/30 0700) Resp:  [14-23] 20 (10/30 0600) BP: (139-197)/(55-108) 139/66 mmHg (10/30 0959) SpO2:  [93 %-99 %] 97 % (10/30 0959) FiO2 (%):  [21 %] 21 % (10/29 1819) Weight:  [73.528 kg (162 lb 1.6 oz)-73.891 kg (162 lb 14.4 oz)] 73.528 kg (162 lb 1.6 oz) (10/30 0600)  CBC:  Recent Labs Lab 12/26/14 1430 12/26/14 1441 12/26/14 1953  WBC 6.5  --  8.3  NEUTROABS 4.6  --  5.5  HGB 12.3 13.9 12.1  HCT 37.1 41.0 37.5  MCV 93.2  --  94.0  PLT 277  --  619    Basic Metabolic Panel:  Recent Labs Lab 12/26/14 1430 12/26/14 1441 12/26/14 1953  NA 137 142 142  K 4.1 4.1 4.3  CL 100* 101 104  CO2 26  --  23  GLUCOSE 169* 168* 100*  BUN 18 21* 20  CREATININE 0.87 0.90 0.98  CALCIUM  9.7  --  9.9    Lipid Panel:    Component Value Date/Time   CHOL 163 12/27/2014 0735   CHOL 177 12/18/2012 0941   TRIG 131 12/27/2014 0735   HDL 39* 12/27/2014 0735   HDL 50 12/18/2012 0941   CHOLHDL 4.2 12/27/2014 0735   CHOLHDL 3.5 12/18/2012 0941   VLDL 26 12/27/2014 0735   LDLCALC 98 12/27/2014 0735   LDLCALC 90 12/18/2012 0941   HgbA1c:  Lab Results  Component Value Date   HGBA1C 7.4* 12/18/2012   Urine Drug Screen: No results found for: LABOPIA, [place], [place], [place], THCU, LABBARB    IMAGING   Ct Head Wo Contrast 12/26/2014   Area of hypoattenuation in the subcortical white matter of the left supratentorial brain. Acute infarction cannot be excluded. Further evaluation with MRI of the brain may be considered. Chronic brain parenchymal atrophy and deep white matter microvascular disease.  Mr Jodene Nam Head/brain Wo Cm 12/26/2014   Acute LEFT frontal cortical and subcortical infarction, 10 x 15 mm, nonhemorrhagic. Severe atrophy with extensive small vessel disease. No proximal large vessel stenosis or occlusion. Intracranial atherosclerotic change involves the distal anterior and middle cerebral artery branches.    PHYSICAL EXAM HEENT- Normocephalic,  no lesions, without obvious abnormality. Normal external eye and conjunctiva. Normal external nose, mucus membranes and septum. Normal pharynx. Cardiovascular- irregular;   Lungs- chest clear, no wheezing, rales, normal symmetric air entry Abdomen- soft, non-tender; bowel sounds normal; no masses, no organomegaly Extremities- no edema Lymph-no adenopathy palpable Musculoskeletal-right knee tenderness Skin-warm and dry, no hyperpigmentation, vitiligo, or suspicious lesions  Neurological Examination Mental Status: Alert, oriented, thought content appropriate. Speech fluent without evidence of aphasia. Able to follow 3 step commands without difficulty. Cranial Nerves: II: Discs flat bilaterally; Visual  fields grossly normal, pupils equal, round, reactive to light and accommodation III,IV, VI: ptosis not present, extra-ocular motions intact bilaterally V,VII: decrease in the right NLF, facial light touch sensation normal bilaterally VIII: hearing normal bilaterally IX,X: gag reflex present XI: bilateral shoulder shrug XII: midline tongue extension  Motor: Right :Upper extremity 5/5 with pronator drift and 5-/5 hand gripLeft: Upper extremity 5/5 Lower extremity 3/5Lower extremity 5/5 Tone and bulk:normal tone throughout; no atrophy noted Sensory: Light touch decreased in the right hand Cerebellar: normal finger-to-nose and normal heel-to-shin testing bilaterally Gait: not tested due to safety concerns   ASSESSMENT/PLAN Ms. RITTA HAMMES is a 81 y.o. female with history of coronary artery disease status post angioplasty, sinus node dysfunction, paroxysmal atrial fibrillation on coumadin, acute bradycardia syndrome, hypertension, hyperlipidemia, obstructive sleep apnea, diabetes mellitus, and chronic kidney disease stage II  presenting with dysarthria, numbness and poor control of the right hand.  She did not receive IV t-PA due to late presentation.  Stroke:  Dominant infarct probably embolic secondary to paroxysmal atrial fibrillation with therapeutic INR.  Resultant  Right sided weakness  MRI - Acute LEFT frontal cortical and subcortical infarction, 10 x 15 mm, nonhemorrhagic.  MRA No proximal large vessel stenosis or occlusion.  Carotid Doppler - pending  2D Echo  pending  INR - 2.53  LDL 98  HgbA1c pending  VTE prophylaxis - Coumadin  Diet Heart Room service appropriate?: Yes; Fluid consistency:: Thin  warfarin daily prior to admission, now on warfarin daily  Patient counseled to be compliant with  her antithrombotic medications  Ongoing aggressive stroke risk factor management  Therapy recommendations: Pending  Disposition:  Pending  Hypertension  Occasional high blood pressures  Permissive hypertension (OK if < 220/120) but gradually normalize in 5-7 days  Hyperlipidemia  Home meds:  Crestor 20 mg daily resumed in hospital  LDL 98, goal < 70  Increase Crestor to 40 mg daily  Continue statin at discharge  Diabetes  HgbA1c pending, goal < 7.0  Uncontrolled  Other Stroke Risk Factors  Advanced age  Family hx stroke (Father)  Coronary artery disease  Obstructive sleep apnea   Hospital day # 1        To contact Stroke Continuity provider, please refer to [email or web address] After hours, contact General Neurology

## 2014-12-27 NOTE — Evaluation (Signed)
Physical Therapy Evaluation Patient Details Name: Abigail Boyd MRN: [id number] DOB: 1929/08/18 Today's Date: 12/27/2014   History of Present Illness  Pt is a 81 y/o F admitted w/ abnormal speech and Rt UE/LE weakness for 4 days.  On admission in a-fib w/ RVR which resolved. Pt's PMH includes unstable angina, syncope, DM II, CKD, spinal stenosis of lumbar region, anxiety, cornoary angioplasty w/ stent placement, Rt knee arthroscopy w/ medial menisectomy.  Clinical Impression  Pt admitted with above diagnosis. Pt currently with functional limitations due to the deficits listed below (see PT Problem List). Abigail Boyd lives alone and will need to go to [facility] to regain independence prior to returning home at safe level of mobility.  She has a h/o falls w/ 6-7 in the past year.  She requires mod assist for bed mobility and min assist for safe transfers and ambulation. Pt will benefit from skilled PT to increase their independence and safety with mobility to allow discharge to the venue listed below.      Follow Up Recommendations SNF;Supervision/Assistance - 24 hour    Equipment Recommendations  Other (comment) (TBD)    Recommendations for Other Services OT consult;Speech consult     Precautions / Restrictions Precautions Precautions: Fall Precaution Comments: h/o Rt knee buckle, knee buckle x1 this session w/ stand pivot transfer.  H/o 6-7 falls in the past year. Restrictions Weight Bearing Restrictions: No      Mobility  Bed Mobility Overal bed mobility: Needs Assistance Bed Mobility: Supine to Sit     Supine to sit: Mod assist;HOB elevated     General bed mobility comments: Use of Lt bed rail and 1 person hand held assist to pull up to sitting. Increased time and cues for technique.    Transfers Overall transfer level: Needs assistance Equipment used: Rolling walker (2 wheeled) Transfers: Sit to/from [facility] Sit to Stand: Min assist Stand pivot transfers:  Min assist       General transfer comment: Min assist to stabilize RW during sit<>stand and managing RW during stand pivot.  Cues to stand upright.  Practiced sit<>stand x3 w/ reminder cues for proper hand placement and safety. Rt knee buckle x1 during stand pivot but pt able to stabilize w/o assist from therapist.  Ambulation/Gait Ambulation/Gait assistance: Min guard Ambulation Distance (Feet): 40 Feet Assistive device: Rolling walker (2 wheeled) Gait Pattern/deviations: Step-through pattern;Decreased stride length;Antalgic;Trunk flexed   Gait velocity interpretation: <1.8 ft/sec, indicative of risk for recurrent falls General Gait Details: Cues to maintain Rt knee extension and activate Rt quads to reduce risk of Rt knee buckle.     Stairs            Wheelchair Mobility    Modified Rankin (Stroke Patients Only) Modified Rankin (Stroke Patients Only) Pre-Morbid Rankin Score: No symptoms Modified Rankin: Moderately severe disability     Balance Overall balance assessment: Needs assistance;History of Falls Sitting-balance support: Bilateral upper extremity supported;Feet supported Sitting balance-Leahy Scale: Fair Sitting balance - Comments: close min guard as pt has h/o sliding out of bed onto floor   Standing balance support: Bilateral upper extremity supported;During functional activity Standing balance-Leahy Scale: Poor Standing balance comment: Relies on RW for support                             Pertinent Vitals/Pain Pain Assessment: Faces Faces Pain Scale: Hurts little more Pain Location: Rt knee w/ transfers/ambulation, this is her baseline Pain  Descriptors / Indicators: Aching;Discomfort Pain Intervention(s): Limited activity within patient's tolerance;Monitored during session;Repositioned    Home Living Family/patient expects to be discharged to:: Skilled nursing facility Living Arrangements: Alone Available Help at Discharge: Family;Available  PRN/intermittently (multiple family members who live nearby but are working) Type of Home: House Home Access: Stairs to enter Entrance Stairs-Rails: Right (Rt grab bar) Entrance Stairs-Number of Steps: 1 Home Layout: Other (Comment) (1 step up from office into main house w/o rail or grab bar) Home Equipment: Walker - 4 wheels;Cane - single point;Bedside commode (rollator; lift chair)      Prior Function Level of Independence: Independent with assistive device(s)         Comments: PTA pt Ind at home using SPC out in community and RW inside home.  Uses SPC to walk car<>house and then transfers to RW once inside.  Pt's son in law initially noticed pt's slurred speech which led her to come to the hospital.  Pt has grab pole on side of bed that assists her w/ pulling up to sitting     Hand Dominance        Extremity/Trunk Assessment   Upper Extremity Assessment: Defer to OT evaluation;RUE deficits/detail RUE Deficits / Details: Power grip 3/5         Lower Extremity Assessment: RLE deficits/detail;LLE deficits/detail RLE Deficits / Details: grossly 4+/5.  H/o Rt knee meniscectomy which pt reports has not improved her pain in Rt knee, causing ocasional Rt knee buckle. LLE Deficits / Details: grossly 5/5 w/ MMT  Cervical / Trunk Assessment: Kyphotic  Communication   Communication: Other (comment);Expressive difficulties (slurred speech)  Cognition Arousal/Alertness: Awake/alert Behavior During Therapy: WFL for tasks assessed/performed Overall Cognitive Status: Within Functional Limits for tasks assessed       Memory: Decreased short-term memory              General Comments General comments (skin integrity, edema, etc.): Pt is agreeable to SNF at d/c to gain strength and indepence before returning home.  Pt's SpO2 remained above 95% on RA throughout duration of session.  Pt's sister, Abigail Boyd, present throughout duration of session.    Exercises         Assessment/Plan    PT Assessment Patient needs continued PT services  PT Diagnosis Difficulty walking;Abnormality of gait;Generalized weakness;Acute pain;Hemiplegia dominant side   PT Problem List Decreased strength;Decreased activity tolerance;Decreased range of motion;Decreased balance;Decreased mobility;Decreased cognition;Decreased knowledge of use of DME;Decreased safety awareness;Decreased knowledge of precautions;Cardiopulmonary status limiting activity;Pain  PT Treatment Interventions DME instruction;Gait training;Stair training;Functional mobility training;Therapeutic activities;Therapeutic exercise;Balance training;Neuromuscular re-education;Cognitive remediation;Patient/family education   PT Goals (Current goals can be found in the Care Plan section) Acute Rehab PT Goals Patient Stated Goal: to go home after rehab PT Goal Formulation: With patient/family Time For Goal Achievement: 01/10/15 Potential to Achieve Goals: Good    Frequency Min 3X/week   Barriers to discharge Inaccessible home environment;Decreased caregiver support Pt lives alone and has steps to navigate at home    Co-evaluation               End of Session Equipment Utilized During Treatment: Gait belt Activity Tolerance: Patient limited by pain;Patient limited by fatigue Patient left: in chair;with call bell/phone within reach;with chair alarm set;with family/visitor present Nurse Communication: Mobility status;Precautions         Time: 1610-9604 PT Time Calculation (min) (ACUTE ONLY): 45 min   Charges:   PT Evaluation $Initial PT Evaluation Tier I: 1 Procedure PT Treatments $Gait Training: 8-22  mins $Therapeutic Activity: 8-22 mins   PT G CodesJoslyn Hy PT, [place] [phone] Pager: [phone] 12/27/2014, 11:47 AM

## 2014-12-27 NOTE — Progress Notes (Signed)
Patient's rhythm has changed from NSR to Afib, notified Rogue Bussing, ordered EKG , cardizem drip and transfer pt. Awaiting bed. Will continue to monitor.

## 2014-12-27 NOTE — Progress Notes (Signed)
VASCULAR LAB PRELIMINARY  PRELIMINARY  PRELIMINARY  PRELIMINARY  Carotid duplex completed.    Preliminary report:  Bilateral:  1-39% ICA stenosis.  Vertebral artery flow is antegrade.     Jhamari Markowicz, RVS 12/27/2014, 2:41 PM

## 2014-12-27 NOTE — Progress Notes (Signed)
Patient transferred from 5M17 to [phone] for initiation of diltiazem infusion with HR 120 - 140 in atrial fibrillation. On transfer, patient's IV was occluded and initiation of medication therapy was delayed until IV access could be established. As soon as IV access was gained, infusion pump was started. It had already been primed and set-up.  Charge RN received a call from [place] while I was starting therapy to notify that the patient was in sinus rhythm, rate 95. Verified on telemetry by charge RN, Sharee Pimple Tsoutis and communicated to me at bedside verbally.  Highly suspect that rhythm conversion was not due to any medication effect, rather that it was spontaneous in nature. Patient has previous history of PAF with RVR and has been on chronic coumadin therapy and beta blocker at home. Evening beta blocker dose was missed last evening. INR was therapeutic on arrival to [facility] ED.  Vtial signs on arrival to unit demonstrate that patient is still quite hypertensive. Filed Vitals:   12/27/14 0600  BP: 167/108  Pulse: 129  Temp: 97.9 F (36.6 C)  Resp: 20   Chaney Malling, NP on call notified of change in rate/rhythm by text page. Diltiazem currently infusing at 5mg /hr per order.  Continuing to monitor closely.

## 2014-12-27 NOTE — Progress Notes (Signed)
ZEN FELLING HUT:[id number] DOB: 1930/02/14 DOA: 12/26/2014 PCP: Morton Peters, MD  Brief narrative:  81 y/o ? Prior CAD 2013 PCI->OM Afib/Tachy-brady syndrome, 09/2011 [? Ablation done at [facility]] CHad2Vasc2=6 on AC-sees Dr. Caryl Comes CKD iii HLD OSA Ty ii dm Prior CVA Hypothyroid with goiter  Admitted to ED 10/29 with abn speech x4 days prior to admit.  + R arm and leg weakness Main issue was diffculty with speech and expressive aphasia. No other specific current concenrs   On admission noted to be in Afib c RVR which resolved     Past medical history-As per Problem list Chart reviewed as below-   Consultants:  Neuro  Procedures:  See above  Antibiotics:  none   Subjective   Well No distress  Still feels she is not getting out her words correctly but this is improved.  No cp No n No V no sob Mildly weak on R side   Objective    Interim History:   Telemetry: Sinus with PVC   Objective: Filed Vitals:   12/26/14 2359 12/27/14 0204 12/27/14 0440 12/27/14 0600  BP: 158/61 158/55 156/56 167/108  Pulse:   124 129  Temp: 98.4 F (36.9 C) 98.1 F (36.7 C) 98.6 F (37 C) 97.9 F (36.6 C)  TempSrc: Oral Oral Oral   Resp: 20  22 20   Height:      Weight:    73.528 kg (162 lb 1.6 oz)  SpO2: 94% 93% 99% 97%    Intake/Output Summary (Last 24 hours) at 12/27/14 6568 Last data filed at 12/27/14 1275  Gross per 24 hour  Intake   5.42 ml  Output    100 ml  Net -94.58 ml    Exam:  General: eomi pleasant oriented in nad Cardiovascular: s1 s 2RRR Respiratory: clea rno adde dsoudn Abdomen: soft nt nd no rebound Skin intact Neuroslight weakness on R, still 5/5 power No arefelxia Sensory gorssly intact.  MOvement RLE limited by prior surgery  Data Reviewed: Basic Metabolic Panel:  Recent Labs Lab 12/26/14 1430 12/26/14 1441 12/26/14 1953  NA 137 142 142  K 4.1 4.1 4.3  CL 100* 101 104  CO2 26  --  23  GLUCOSE 169* 168* 100*  BUN 18  21* 20  CREATININE 0.87 0.90 0.98  CALCIUM 9.7  --  9.9   Liver Function Tests:  Recent Labs Lab 12/26/14 1430 12/26/14 1953  AST 20 23  ALT 17 15  ALKPHOS 53 49  BILITOT 0.2* 0.4  PROT 6.9 6.8  ALBUMIN 3.7 3.5   No results for input(s): LIPASE, AMYLASE in the last 168 hours. No results for input(s): AMMONIA in the last 168 hours. CBC:  Recent Labs Lab 12/26/14 1430 12/26/14 1441 12/26/14 1953  WBC 6.5  --  8.3  NEUTROABS 4.6  --  5.5  HGB 12.3 13.9 12.1  HCT 37.1 41.0 37.5  MCV 93.2  --  94.0  PLT 277  --  270   Cardiac Enzymes:  Recent Labs Lab 12/26/14 1953  TROPONINI <0.03   BNP: Invalid input(s): POCBNP CBG:  Recent Labs Lab 12/26/14 2218 12/27/14 0740  GLUCAP 101* 212*    No results found for this or any previous visit (from the past 240 hour(s)).   Studies:              All Imaging reviewed and is as per above notation   Scheduled Meds: . insulin aspart  0-5 Units Subcutaneous QHS  .  insulin aspart  0-9 Units Subcutaneous 6 times per day  . latanoprost  1 drop Both Eyes QHS  . loratadine  10 mg Oral QHS  . metoprolol tartrate  12.5 mg Oral BID  . mirabegron ER  50 mg Oral QHS  . pantoprazole  40 mg Oral Daily  . PARoxetine  10 mg Oral Daily  . rosuvastatin  20 mg Oral QHS  . timolol  1 drop Both Eyes Daily  . warfarin  5 mg Oral Once per day on Sun Mon Tue Wed Thu Sat  . [START ON 01/01/2015] warfarin  7.5 mg Oral Q Fri-1800  . Warfarin - Pharmacist Dosing Inpatient   Does not apply q1800   Continuous Infusions: . diltiazem (CARDIZEM) infusion 5 mg/hr (12/27/14 0554)     Assessment/Plan:   1. New CVA-acute L frontal cortical CVA 10 x 15 mm.  Deficits mainly speech and mild R sided weakness.  No other deficit.  Consults pending.  Echo and [place].  Stroke team to see-? Need for anti-plt agent in addition to Coumadin.  2. Afib c h/o Tachy-brady- had RVR on admit-now rate controlled-converted on admission.  D/c Cardizem Gtt.  Re-start home  metoprolol 12.5 bid.  Monitor on tele.  INR therapeutic in 2.5 ranbge 3. HLD-continue Crestor 20 qhs 4. Depression-Paxil 10 mg daily, xanax 0.25 qhs 5. DM ty ii-suagrs 168-211.  Continue SSI qid achs as has passed swallow eval.  Home meds include lantus 36, so add at les 15-20 units here.  Also on metformin 1000, and Sitagliptin 50 qhs bid on hold 6. CKD 1 2/2 to DM./htn-monitor am labs 7. Glaucoma-cont Gtt for eyes 8. Allergies-hold fexofenadine and xyzal for now 9. Gerd-cont Protonix as sub for Omeprazole 10. RLS-hold Ropinirole  0.5 qhs for now   No family-called daughter and updated FULL CODE Inpatient till work-up  [name], MD  [name] Pager [phone] 12/27/2014, 8:28 AM    LOS: 1 day

## 2014-12-27 NOTE — Progress Notes (Signed)
[facility] for warfarin Indication: atrial fibrillation  Allergies  Allergen Reactions  . Lisinopril Cough  . Losartan Cough  . Sulfa Antibiotics Hives    Patient Measurements: Height: 5\' 4"  (162.6 cm) Weight: 162 lb 1.6 oz (73.528 kg) IBW/kg (Calculated) : 54.7   Vital Signs: Temp: 97.9 F (36.6 C) (10/30 0600) Temp Source: Oral (10/30 0440) BP: 167/108 mmHg (10/30 0600) Pulse Rate: 129 (10/30 0600)  Labs:  Recent Labs  12/26/14 1430 12/26/14 1441 12/26/14 1953  HGB 12.3 13.9 12.1  HCT 37.1 41.0 37.5  PLT 277  --  270  APTT 33  --   --   LABPROT 27.1*  --   --   INR 2.55*  --   --   CREATININE 0.87 0.90 0.98  TROPONINI  --   --  <0.03    Estimated Creatinine Clearance: 42.7 mL/min (by C-G formula based on Cr of 0.98).  Assessment: 81 yo female presenting with aphasia and right sided weakness. On warfaring PTA for A.fib.  INR on admission 2.55, PTA warfarin dose 7.5 mg Friday, 5 mg AOD Heme: Hgb wnl, Plt wnl, INR 2.53  Goal of Therapy:  INR 2-3 Monitor platelets by anticoagulation protocol: Yes   Plan:  Warfarin 7.5 mg Friday and 5 mg AOD Daily INR, CBC q72h Monitor for s/sx of bleeding  Darl Pikes, PharmD Clinical Pharmacist- Resident Pager: [phone]   12/27/2014 8:22 AM

## 2014-12-27 NOTE — Evaluation (Signed)
Speech Language Pathology Evaluation Patient Details Name: JAVONNA BALLI MRN: [id number] DOB: 04/07/1929 Today's Date: 12/27/2014 Time: 1245-8099 SLP Time Calculation (min) (ACUTE ONLY): 17 min  Problem List:  Patient Active Problem List   Diagnosis Date Noted  . CAD in native artery   . Cerebrovascular accident (CVA) ([place])   . CKD (chronic kidney disease), stage II   . HLD (hyperlipidemia)   . OSA (obstructive sleep apnea)   . Paroxysmal atrial fibrillation (HCC)   . Tachycardia-bradycardia syndrome ([place])   . Uncontrolled type 2 diabetes mellitus with complication, with long-term current use of insulin ([place])   . Stroke (cerebrum) ([place]) 12/26/2014  . CVA (cerebral infarction) 12/26/2014  . Preop cardiovascular exam 06/10/2014  . PLMD (periodic limb movement disorder) 06/12/2013  . Obstructive sleep apnea 03/05/2013  . Tachy-brady syndrome: Rates range from 50-169 bpm in A. fib 12/20/2012  . Hypotension 12/19/2012  . History of syncope: Unclear etiology 12/19/2012  . Chronotropic incompetence - partially medication related 08/18/2012  . PAF (paroxysmal atrial fibrillation) ([place]) 05/16/2012    Class: Diagnosis of  . Long term (current) use of anticoagulants 05/16/2012  . CAD-PCI OM1, 100% RCA (L-R colllaterals) 07/07/2011  . Hyperlipidemia with target LDL less than 70     Class: Diagnosis of  . Essential hypertension     Class: Diagnosis of  . Chronic kidney disease     Class: Diagnosis of  . Spinal stenosis     Class: History of  . Diabetes mellitus     Class: Diagnosis of  . Unstable angina - history of; resolved, status post PCI of the OM 06/13/2011    Class: History of   Past Medical History:  Past Medical History  Diagnosis Date  . History of unstable angina 06/13/2011    Jaw pain awakening from sleep -- Myoview --CATH --> PCI  . Abnormal nuclear stress test 06/21/2011    Inferolateral reversible defect;--> cardiac cath & PCI of CxOM, occluded RCA with  collaterals;; followup Myoview 11/2012: Low risk. Fixed basal inferior artifact normal EF. No ischemia  . CAD S/P percutaneous coronary angioplasty     s/P PCI to proximal OM1 w/ DES; occluded RCA with bridging and L-R collaterals (medical management)  . Shortness of breath on exertion October 2013    2-D echo: Normal EF>55%, Gr 1DD, mild aortic sclerosis; Evaluated with CPET - peak VO2 97%; Chronotropic Incompetence (submaximal effort)  . Chronotropic incompetence with sinus node dysfunction [facility]) October 2013    On CPET test; beta blockers reduced  . PAF (paroxysmal atrial fibrillation) ([place]) 10-11/ 2014    CardioNet Event Monitor: NSR & S Brady -- Rates 50-100; Total A. fib burden 35 hours and 27 minutes. 1296 episodes, longest was 1 hour 29 minutes. Rate ranged from 52-169 beats per minute.  . Tachycardia-bradycardia syndrome ([place]) 11/2012  . History of syncope     Per EP - neurocardiogenic & not Bradycardia related (no PPM)  . OSA (obstructive sleep apnea)     hx bladder infections  . Diabetes mellitus type 2, controlled ([place])     On oral medications  . Essential hypertension     Allowing for permissive hypertension to avoid orthostatic hypotension  . Hyperlipidemia with target LDL less than 70     HDL at goal, LDL not at goal, borderline triglycerides. On Crestor 20 mg  . Chronic kidney disease (CKD), stage II (mild)     Related to current bladder infections (although diabetes cannot be excluded)  . Spinal  stenosis of lumbar region 11/2011  . Osteoarthritis   . GERD (gastroesophageal reflux disease)     On PPI  . Seasonal allergies   . Anxiety     When necessary Xanax  . Glaucoma   . Bilateral cataracts     Status post stroke or correction   Past Surgical History:  Past Surgical History  Procedure Laterality Date  . Cataracts      both eyes  . Colonoscopy    . Vaginal hysterectomy    . Breast biopsy      both breast  . Cardiac catheterization  07/05/2011    Unstable  Angina --> Myoview wiht Inf-Lat Ischemia.  Proximal OM1 lesion --> PCI; 100% mid RCA with right to right and left to right bridging collaterals from circumflex RPL and LAD the PDA.  Marland Kitchen Coronary angioplasty with stent placement  07/05/2011    Promus Element DES 2.5 mm x 16 mm-post dilated to 2.65 mm CX-Proximal OM1  . Nm myoview ltd  October 2014     Low risk. Fixed basal inferior artifact normal EF. No ischemia --> (as compared to pre-PCI Myoview revealing inferolateral ischemia)  . Transthoracic echocardiogram  October 2013    Normal EF, >55%. No regional WMA, Grd 1 DD,  mildly sclerotic aortic valve without stenosis  . Left heart catheterization with coronary angiogram N/A 07/06/2011    Procedure: LEFT HEART CATHETERIZATION WITH CORONARY ANGIOGRAM;  Surgeon: Leonie Man, MD;  Location: [facility] CATH LAB;  Service: Cardiovascular;  Laterality: N/A;  . Knee arthroscopy with medial menisectomy Right 06/24/2014    Procedure: RIGHT KNEE ARTHROSCOPY WITH partial lateral MENISECTOMY, abrasion chondroplasty of medial femoral condyle and patella, microfracture technique;  Surgeon: Latanya Maudlin, MD;  Location: [facility] ORS;  Service: Orthopedics;  Laterality: Right;   HPI:  Pt is 81 y.o. female admitted with acute L frontal cortical CVA 10 x 15 mm. Deficits mainly speech and mild R sided weakness   Assessment / Plan / Recommendation Clinical Impression  Pt presents with a mild aphasia, apraxia of speech that impacts comprehension of complex, novel stimuli and expression of complex ideas.  Cognition intact.  Recommend SLP f/u at SNF to address higher level communication deficits.  Pt in agreement.     SLP Assessment  Patient needs continued Speech Lanaguage Pathology Services    Follow Up Recommendations  Skilled Nursing facility    Frequency and Duration min 1 x/week  1 week   Pertinent Vitals/Pain Pain Assessment: No/denies pain Faces Pain Scale: Hurts little more Pain Location: Rt knee w/  transfers/ambulation, this is her baseline Pain Descriptors / Indicators: Aching;Discomfort Pain Intervention(s): Limited activity within patient's tolerance;Monitored during session;Repositioned   SLP Goals  Potential to Achieve Goals (ACUTE ONLY): Good  SLP Evaluation Prior Functioning  Type of Home: House  Lives With: Alone Available Help at Discharge: Family;Available PRN/intermittently   Cognition  Overall Cognitive Status: Within Functional Limits for tasks assessed    Comprehension  Auditory Comprehension Overall Auditory Comprehension: Impaired Yes/No Questions: Within Functional Limits Commands: Impaired Multistep Basic Commands: 75-100% accurate Conversation: Simple Visual Recognition/Discrimination Discrimination: Within Function Limits Reading Comprehension Reading Status: Within funtional limits    Expression Expression Primary Mode of Expression: Verbal Verbal Expression Overall Verbal Expression: Impaired Automatic Speech: Social Response Level of Generative/Spontaneous Verbalization: Conversation Repetition: Impaired Level of Impairment: Sentence level Naming: Impairment Confrontation: Within functional limits Divergent: 50-74% accurate Pragmatics: No impairment Written Expression Dominant Hand: Right   Oral / Motor Oral Motor/Sensory Function Overall  Oral Motor/Sensory Function: Appears within functional limits for tasks assessed Motor Speech Overall Motor Speech: Appears within functional limits for tasks assessed   Keirston Saephanh L. Tivis Ringer, [place] CCC/SLP Pager [phone]      Juan Quam Laurice 12/27/2014, 1:14 PM

## 2014-12-27 NOTE — Progress Notes (Signed)
Patient transferred to [phone] via bed after report called to [facility].

## 2014-12-28 ENCOUNTER — Ambulatory Visit (HOSPITAL_COMMUNITY): Payer: Medicare Other

## 2014-12-28 DIAGNOSIS — I639 Cerebral infarction, unspecified: Secondary | ICD-10-CM

## 2014-12-28 DIAGNOSIS — I6789 Other cerebrovascular disease: Secondary | ICD-10-CM

## 2014-12-28 LAB — CBC
HCT: 35.7 % — ABNORMAL LOW (ref 36.0–46.0)
HEMOGLOBIN: 11.9 g/dL — AB (ref 12.0–15.0)
MCH: 30.9 pg (ref 26.0–34.0)
MCHC: 33.3 g/dL (ref 30.0–36.0)
MCV: 92.7 fL (ref 78.0–100.0)
PLATELETS: 267 10*3/uL (ref 150–400)
RBC: 3.85 MIL/uL — ABNORMAL LOW (ref 3.87–5.11)
RDW: 13.2 % (ref 11.5–15.5)
WBC: 8.3 10*3/uL (ref 4.0–10.5)

## 2014-12-28 LAB — GLUCOSE, CAPILLARY
GLUCOSE-CAPILLARY: 255 mg/dL — AB (ref 65–99)
GLUCOSE-CAPILLARY: 212 mg/dL — AB (ref 65–99)
Glucose-Capillary: 309 mg/dL — ABNORMAL HIGH (ref 65–99)
Glucose-Capillary: 372 mg/dL — ABNORMAL HIGH (ref 65–99)

## 2014-12-28 LAB — HEMOGLOBIN A1C
HEMOGLOBIN A1C: 8.4 % — AB (ref 4.8–5.6)
MEAN PLASMA GLUCOSE: 194 mg/dL

## 2014-12-28 LAB — PROTIME-INR
INR: 2.94 — AB (ref 0.00–1.49)
PROTHROMBIN TIME: 30.1 s — AB (ref 11.6–15.2)

## 2014-12-28 MED ORDER — INSULIN GLARGINE 100 UNIT/ML ~~LOC~~ SOLN
32.0000 [IU] | Freq: Every day | SUBCUTANEOUS | Status: DC
  Administered 2014-12-28: 32 [IU] via SUBCUTANEOUS
  Filled 2014-12-28 (×2): qty 0.32

## 2014-12-28 MED ORDER — WARFARIN SODIUM 1 MG PO TABS: 1.0000 mg | ORAL_TABLET | Freq: Once | ORAL | Status: DC

## 2014-12-28 MED ORDER — INSULIN ASPART 100 UNIT/ML ~~LOC~~ SOLN
0.0000 [IU] | Freq: Three times a day (TID) | SUBCUTANEOUS | Status: DC
  Administered 2014-12-28: 15 [IU] via SUBCUTANEOUS
  Administered 2014-12-29: 3 [IU] via SUBCUTANEOUS

## 2014-12-28 MED ORDER — INSULIN ASPART 100 UNIT/ML ~~LOC~~ SOLN
3.0000 [IU] | Freq: Three times a day (TID) | SUBCUTANEOUS | Status: DC
  Administered 2014-12-28 – 2014-12-29 (×2): 3 [IU] via SUBCUTANEOUS

## 2014-12-28 MED ORDER — RIVAROXABAN 15 MG PO TABS
15.0000 mg | ORAL_TABLET | Freq: Every day | ORAL | Status: DC
  Administered 2014-12-28: 15 mg via ORAL
  Filled 2014-12-28: qty 1

## 2014-12-28 NOTE — Progress Notes (Signed)
STROKE TEAM PROGRESS NOTE   SUBJECTIVE (INTERVAL HISTORY) Her sister-in-law and brother are at the bedside.   OBJECTIVE Temp:  [98 F (36.7 C)-98.5 F (36.9 C)] 98 F (36.7 C) (10/31 1112) Pulse Rate:  [66-78] 78 (10/31 1112) Cardiac Rhythm:  [-] Normal sinus rhythm (10/31 0700) Resp:  [15-20] 15 (10/31 1112) BP: (132-183)/(58-70) 148/58 mmHg (10/31 1112) SpO2:  [96 %-98 %] 96 % (10/31 1112) Weight:  [71.759 kg (158 lb 3.2 oz)] 71.759 kg (158 lb 3.2 oz) (10/31 0302)  CBC:  Recent Labs Lab 12/26/14 1430  12/26/14 1953 12/28/14 0529  WBC 6.5  --  8.3 8.3  NEUTROABS 4.6  --  5.5  --   HGB 12.3  < > 12.1 11.9*  HCT 37.1  < > 37.5 35.7*  MCV 93.2  --  94.0 92.7  PLT 277  --  270 267  < > = values in this interval not displayed.  Basic Metabolic Panel:   Recent Labs Lab 12/26/14 1430 12/26/14 1441 12/26/14 1953  NA 137 142 142  K 4.1 4.1 4.3  CL 100* 101 104  CO2 26  --  23  GLUCOSE 169* 168* 100*  BUN 18 21* 20  CREATININE 0.87 0.90 0.98  CALCIUM 9.7  --  9.9    Lipid Panel:     Component Value Date/Time   CHOL 163 12/27/2014 0735   CHOL 177 12/18/2012 0941   TRIG 131 12/27/2014 0735   HDL 39* 12/27/2014 0735   HDL 50 12/18/2012 0941   CHOLHDL 4.2 12/27/2014 0735   CHOLHDL 3.5 12/18/2012 0941   VLDL 26 12/27/2014 0735   LDLCALC 98 12/27/2014 0735   LDLCALC 90 12/18/2012 0941   HgbA1c:  Lab Results  Component Value Date   HGBA1C 8.4* 12/27/2014    IMAGING   Ct Head Wo Contrast 12/26/2014   Area of hypoattenuation in the subcortical white matter of the left supratentorial brain. Acute infarction cannot be excluded. Further evaluation with MRI of the brain may be considered. Chronic brain parenchymal atrophy and deep white matter microvascular disease.  Mr Jodene Nam Head/brain Wo Cm 12/26/2014   Acute LEFT frontal cortical and subcortical infarction, 10 x 15 mm, nonhemorrhagic. Severe atrophy with extensive small vessel disease. No proximal large vessel  stenosis or occlusion. Intracranial atherosclerotic change involves the distal anterior and middle cerebral artery branches.   Carotid Doppler   There is 1-39% bilateral ICA stenosis. Vertebral artery flow is antegrade.     PHYSICAL EXAM HEENT- Normocephalic, no lesions, without obvious abnormality. Normal external eye and conjunctiva. Normal external nose, mucus membranes and septum. Normal pharynx. Cardiovascular- irregular;   Lungs- chest clear, no wheezing, rales, normal symmetric air entry Abdomen- soft, non-tender; bowel sounds normal; no masses, no organomegaly Extremities- no edema Lymph-no adenopathy palpable Musculoskeletal-right knee tenderness Skin-warm and dry, no hyperpigmentation, vitiligo, or suspicious lesions  Neurological Examination Mental Status: Alert, oriented, thought content appropriate. Speech fluent without evidence of aphasia. Able to follow 3 step commands without difficulty. Cranial Nerves: II: Discs flat bilaterally; Visual fields grossly normal, pupils equal, round, reactive to light and accommodation III,IV, VI: ptosis not present, extra-ocular motions intact bilaterally V,VII: decrease in the right NLF, facial light touch sensation normal bilaterally VIII: hearing normal bilaterally IX,X: gag reflex present XI: bilateral shoulder shrug XII: midline tongue extension  Motor: Right :Upper extremity 5/5 with pronator drift and 5-/5 hand gripLeft: Upper extremity 5/5 Lower extremity 3/5Lower extremity 5/5 Tone and bulk:normal tone throughout; no atrophy noted  Sensory: Light touch decreased in the right hand Cerebellar: normal finger-to-nose and normal heel-to-shin testing bilaterally Gait: not tested due to safety concerns   ASSESSMENT/PLAN [name] is a 81  y.o. female with history of coronary artery disease status post angioplasty, sinus node dysfunction, paroxysmal atrial fibrillation on coumadin, acute bradycardia syndrome, hypertension, hyperlipidemia, obstructive sleep apnea, diabetes mellitus, and chronic kidney disease stage II  presenting with dysarthria, numbness and poor control of the right hand.  She did not receive IV t-PA due to late presentation.  Stroke:  Dominant infarct probably embolic secondary to paroxysmal atrial fibrillation with therapeutic INR.  Resultant  Right hand and face weakness, expressive aphasia  MRI - Acute LEFT frontal cortical and subcortical infarction, 10 x 15 mm, nonhemorrhagic.  MRA No proximal large vessel stenosis or occlusion.  Carotid Doppler - No significant stenosis   2D Echo  pending   INR - 2.53  LDL 98  HgbA1c 8.4  VTE prophylaxis - Coumadin Diet Heart Room service appropriate?: Yes; Fluid consistency:: Thin  warfarin daily prior to admission, continued in hospital, now on warfarin daily. Given stroke on coumadin with INR 2.55 on affival, recommend change to NOAC. Have asked care management to assist patient. No additional antiplatelet added  Patient counseled to be compliant with her antithrombotic medications  Ongoing aggressive stroke risk factor management  Therapy recommendations: SNF  Disposition:  pending   Atrial Fibrillation  Home anticoagulation:  warfarin continued in the hospital  INR 2.55 on admission  See anticoagulation note above   Hypertension  Occasional high blood pressures  Permissive hypertension (OK if < 220/120) but gradually normalize in 5-7 days  Hyperlipidemia  Home meds:  Crestor 20 mg daily resumed in hospital  LDL 98, goal < 70  Increase Crestor to 40 mg daily  Continue statin at discharge  Diabetes  HgbA1c 8.4, goal < 7.0  Uncontrolled  Other Stroke Risk Factors  Advanced age  Hx previous stroke  Family hx stroke  (Father)  Coronary artery disease  Obstructive sleep apnea, refuses CPAP  Other active problems  Depression on paxil  CKD stage 1 d/t diabetes  Glaucoma  Allergies   GERD  RLS  Hospital day # 2  BIBY,SHARON  [place] for Pager information 12/28/2014 11:50 AM  I have personally examined this patient, reviewed notes, independently viewed imaging studies, participated in medical decision making and plan of care. I have made any additions or clarifications directly to the above note. Agree with note above.  I had a long discussion with the patient and family at the bedside regarding available medications for secondary stroke prevention for atrial fibrillation and discuss risk and benefit of the Anticoagulants over warfarin. Patient appears cautious about affording the co-pay for for the newer anticoagulants and we will have social worker to get to her co-pay and make a final decision if she can afford to changed to eliquis at the time of discharge Antony Contras, [place] Pager: [phone] 12/28/2014 3:06 PM  To contact Stroke Continuity provider, please refer to [email or web address] After hours, contact General Neurology

## 2014-12-28 NOTE — Progress Notes (Signed)
Abigail Boyd TML:[id number] DOB: 04/27/1929 DOA: 12/26/2014 PCP: Morton Peters, MD  Brief narrative:  81 y/o ? Prior CAD 2013 PCI->OM Afib/Tachy-brady syndrome, 09/2011 [? Ablation done at [facility]] CHad2Vasc2=6 on AC-sees Dr. Caryl Comes CKD iii HLD OSA Ty ii dm Prior CVA Hypothyroid with goiter  Admitted to ED 10/29 with abn speech x4 days prior to admit.  + R arm and leg weakness Main issue was diffculty with speech and expressive aphasia. No other specific current concenrs   On admission noted to be in Afib c RVR which resolved  Past medical history-As per Problem list Chart reviewed as below-   Consultants:  Neuro  Procedures:  See above  Antibiotics:  none   Subjective   Much better speech.  stronger in R arm No n/v Eating independantly at beside   Objective    Interim History:   Telemetry: Sinus with PVC   Objective: Filed Vitals:   12/28/14 0302 12/28/14 0445 12/28/14 0727 12/28/14 1112  BP: 171/64 183/66 132/70 148/58  Pulse: 66 69 67 78  Temp: 98.3 F (36.8 C)  98.1 F (36.7 C) 98 F (36.7 C)  TempSrc:   Oral Oral  Resp: 19  15 15   Height:      Weight: 71.759 kg (158 lb 3.2 oz)     SpO2: 98%  97% 96%    Intake/Output Summary (Last 24 hours) at 12/28/14 1409 Last data filed at 12/28/14 1400  Gross per 24 hour  Intake    480 ml  Output    400 ml  Net     80 ml    Exam:  General: eomi pleasant oriented in nad Cardiovascular: s1 s 2RRR Respiratory: clear no added soudn Abdomen: soft nt nd no rebound Skin intact Neuroslight weakness on R, still 5/5 power No arefelxia Sensory grossly intact.  Movement RLE limited by prior surgery  Data Reviewed: Basic Metabolic Panel:  Recent Labs Lab 12/26/14 1430 12/26/14 1441 12/26/14 1953  NA 137 142 142  K 4.1 4.1 4.3  CL 100* 101 104  CO2 26  --  23  GLUCOSE 169* 168* 100*  BUN 18 21* 20  CREATININE 0.87 0.90 0.98  CALCIUM 9.7  --  9.9   Liver Function Tests:  Recent  Labs Lab 12/26/14 1430 12/26/14 1953  AST 20 23  ALT 17 15  ALKPHOS 53 49  BILITOT 0.2* 0.4  PROT 6.9 6.8  ALBUMIN 3.7 3.5   No results for input(s): LIPASE, AMYLASE in the last 168 hours. No results for input(s): AMMONIA in the last 168 hours. CBC:  Recent Labs Lab 12/26/14 1430 12/26/14 1441 12/26/14 1953 12/28/14 0529  WBC 6.5  --  8.3 8.3  NEUTROABS 4.6  --  5.5  --   HGB 12.3 13.9 12.1 11.9*  HCT 37.1 41.0 37.5 35.7*  MCV 93.2  --  94.0 92.7  PLT 277  --  270 267   Cardiac Enzymes:  Recent Labs Lab 12/26/14 1953  TROPONINI <0.03   BNP: Invalid input(s): POCBNP CBG:  Recent Labs Lab 12/27/14 1141 12/27/14 1642 12/27/14 2107 12/28/14 0728 12/28/14 1109  GLUCAP 267* 251* 373* 255* 309*    No results found for this or any previous visit (from the past 240 hour(s)).   Studies:              All Imaging reviewed and is as per above notation   Scheduled Meds: . insulin aspart  0-15 Units Subcutaneous TID WC  .  insulin aspart  3 Units Subcutaneous TID WC  . insulin glargine  32 Units Subcutaneous QHS  . latanoprost  1 drop Both Eyes QHS  . loratadine  10 mg Oral QHS  . metoprolol tartrate  12.5 mg Oral BID  . mirabegron ER  50 mg Oral QHS  . pantoprazole  40 mg Oral Daily  . PARoxetine  10 mg Oral Daily  . rosuvastatin  40 mg Oral QHS  . timolol  1 drop Both Eyes Daily  . warfarin  1 mg Oral ONCE-1800  . Warfarin - Pharmacist Dosing Inpatient   Does not apply q1800   Continuous Infusions:     Assessment/Plan:   1. New CVA-acute L frontal cortical CVA 10 x 15 mm.  Deficits mainly speech and mild R sided weakness.  No other deficit.  Consults pending.  Echo  EF 65-70 Grade 1 DD [no acute component] and US shows no pathology.   change to NOAC as had CVA on therapeutic INR level 2. Afib c h/o Tachy-brady- had RVR on admit-now rate controlled-converted on admission.  D/c Cardizem Gtt.  Re-start home metoprolol 12.5 bid.  Monitor on tele-PVC's only.   Change to [place] 3. HLD-continue Crestor 20 qhs-->40 qhs 4. Depression-Paxil 10 mg daily, xanax 0.25 qhs 5. DM ty ii-suagrs 250-300 range.  Continue SSI qid achs as has passed swallow eval.  Home meds include lantus 36--increased to MOd s coverage ssi.  Also on metformin 1000, and Sitagliptin 50 qhs bid on hold 6. CKD 1 2/2 to DM./htn-monitor am labs 7. Glaucoma-cont Gtt for eyes 8. Allergies-hold fexofenadine and xyzal for now 9. Gerd-cont Protonix as sub for Omeprazole 10. RLS-hold Ropinirole  0.5 qhs for now   No family-likely d/c to [facility] soon FULL CODE likely d/c in am  [name], MD  [name] Pager [phone] 12/28/2014, 2:09 PM    LOS: 2 days

## 2014-12-28 NOTE — Clinical Social Work Note (Signed)
Clinical Social Work Assessment  Patient Details  Name: Abigail Boyd MRN: [id number] Date of Birth: 07-Dec-1929  Date of referral:  12/28/14               Reason for consult:  Facility Placement                Permission sought to share information with:  Facility Sport and exercise psychologist, Family Supports Permission granted to share information::  Yes, Verbal Permission Granted  Name::     Abigail Boyd  Agency::  SNF  Relationship::  Daughter  Contact Information:  [phone]  Housing/Transportation Living arrangements for the past 2 months:  Single Family Home Source of Information:  Patient Patient Interpreter Needed:  None Criminal Activity/Legal Involvement Pertinent to Current Situation/Hospitalization:  No - Comment as needed Significant Relationships:  Adult Children, Other Family Members Lives with:  Self Do you feel safe going back to the place where you live?  Yes Need for family participation in patient care:  Yes (Comment)  Care giving concerns:  Patient lives at home alone and PT is recommending SNF at discharge for short-term rehab.    Social Worker assessment / plan:  BSW intern met with patient, patient brother, and patient sister-in-law at bedside to complete assessment. BSW intern asked patient where she lived before being admitted to the hospital. Patient stated that she lives at home alone in [place] in a house that she has lived in for 50 years. Patient stated that her nephew lived next door and check on her every day. BSW intern explained the SNF process with the patient and asked patient if she would be agreeable to SNF placement. Patient stated that she was agreeable to SNF. Patient gave BSW intern a list of desired facilities. BSW intern contacted preferred facilities. BSW intern will follow up with patient on bed offers and will continue to follow and assist as needed.   Employment status:  Retired Nurse, adult PT  Recommendations:  [place] / Referral to community resources:  [place]  Patient/Family's Response to care:  Patient and patient family are appreciative of care. Patient and patient family expressed that they are happy with the care that they are receiving at the hospital.   Patient/Family's Understanding of and Emotional Response to Diagnosis, Current Treatment, and Prognosis:  Patient and family understand patient's situation and seems optimistic about her care at discharge. Patient understands her post discharge needs.  Emotional Assessment Appearance:  Appears stated age, Well-Groomed Attitude/Demeanor/Rapport:   (Pleasant) Affect (typically observed):  Accepting, Hopeful, Pleasant, Appropriate Orientation:  Oriented to Self, Oriented to Place, Oriented to  Time, Oriented to Situation Alcohol / Substance use:  Not Applicable Psych involvement (Current and /or in the community):  No (Comment)  Discharge Needs  Concerns to be addressed:  No discharge needs identified Readmission within the last 30 days:  No Current discharge risk:  Lives alone Barriers to Discharge:  Continued Medical Work up    [employer], [id number]

## 2014-12-28 NOTE — Progress Notes (Signed)
[place] for warfarin Indication: atrial fibrillation  Allergies  Allergen Reactions  . Lisinopril Cough  . Losartan Cough  . Sulfa Antibiotics Hives    Patient Measurements: Height: 5\' 4"  (162.6 cm) Weight: 158 lb 3.2 oz (71.759 kg) IBW/kg (Calculated) : 54.7   Vital Signs: Temp: 98 F (36.7 C) (10/31 1112) Temp Source: Oral (10/31 1112) BP: 148/58 mmHg (10/31 1112) Pulse Rate: 78 (10/31 1112)  Labs:  Recent Labs  12/26/14 1430 12/26/14 1441 12/26/14 1953 12/27/14 0735 12/28/14 0529  HGB 12.3 13.9 12.1  --  11.9*  HCT 37.1 41.0 37.5  --  35.7*  PLT 277  --  270  --  267  APTT 33  --   --   --   --   LABPROT 27.1*  --   --  26.9* 30.1*  INR 2.55*  --   --  2.53* 2.94*  CREATININE 0.87 0.90 0.98  --   --   TROPONINI  --   --  <0.03  --   --     Estimated Creatinine Clearance: 42.2 mL/min (by C-G formula based on Cr of 0.98).  Assessment: 81 yo female presenting with aphasia and right sided weakness. On warfaring PTA for A.fib.  INR on admission 2.55, now up to 2.94 - high end of therapeutic range. Will dc home dose and dose daily for now. May transition to NOAC per Neuro note since stroke on therapeutic warfarin. Hg 11.9 stable, plt wnl. No bleed documented  PTA warfarin dose 7.5 mg Friday, 5 mg AOD (last taken 10/28 pta)  Goal of Therapy:  INR 2-3 Monitor platelets by anticoagulation protocol: Yes   Plan:  D/c home dose for now Warfarin 1mg  x 1 dose tonight Daily INR, CBC q72h Monitor for s/sx of bleeding  Elicia Lamp, PharmD Clinical Pharmacist Pager [phone] 12/28/2014 12:47 PM

## 2014-12-28 NOTE — NC FL2 (Addendum)
[place] MEDICAID FL2 LEVEL OF CARE SCREENING TOOL     IDENTIFICATION  Patient Name: Abigail Boyd Birthdate: 03/19/1929 Sex: female Admission Date (Current Location): 12/26/2014  [facility] Xxx and [place] Number: Herbalist and Address:  [facility] [place]. [facility], [place] [address], [place], [place] [address]      Provider Number: [id number]  Attending Physician Name and Address:  Nita Sells, MD  Relative Name and Phone Number:  Loreli Slot [phone]    Current Level of Care: Hospital Recommended Level of Care: [place] Prior Approval Number:    Date Approved/Denied:   PASRR Number:   [id number]  Discharge Plan: SNF    Current Diagnoses: Patient Active Problem List   Diagnosis Date Noted  . CAD in native artery   . Cerebrovascular accident (CVA) ([place])   . CKD (chronic kidney disease), stage II   . HLD (hyperlipidemia)   . OSA (obstructive sleep apnea)   . Paroxysmal atrial fibrillation (HCC)   . Tachycardia-bradycardia syndrome ([place])   . Uncontrolled type 2 diabetes mellitus with complication, with long-term current use of insulin ([place])   . Stroke (cerebrum) ([place]) 12/26/2014  . CVA (cerebral infarction) 12/26/2014  . Preop cardiovascular exam 06/10/2014  . PLMD (periodic limb movement disorder) 06/12/2013  . Obstructive sleep apnea 03/05/2013  . Tachy-brady syndrome: Rates range from 50-169 bpm in A. fib 12/20/2012  . Hypotension 12/19/2012  . History of syncope: Unclear etiology 12/19/2012  . Chronotropic incompetence - partially medication related 08/18/2012  . PAF (paroxysmal atrial fibrillation) ([place]) 05/16/2012    Class: Diagnosis of  . Long term (current) use of anticoagulants 05/16/2012  . CAD-PCI OM1, 100% RCA (L-R colllaterals) 07/07/2011  . Hyperlipidemia with target LDL less than 70     Class: Diagnosis of  . Essential hypertension     Class: Diagnosis of  . Chronic kidney disease     Class: Diagnosis of   . Spinal stenosis     Class: History of  . Diabetes mellitus     Class: Diagnosis of  . Unstable angina - history of; resolved, status post PCI of the OM 06/13/2011    Class: History of    Orientation ACTIVITIES/SOCIAL BLADDER RESPIRATION    Self, Time, Situation, Place  Active Incontinent Normal  BEHAVIORAL SYMPTOMS/MOOD NEUROLOGICAL BOWEL NUTRITION STATUS      Continent Diet (Heart Healthy)  PHYSICIAN VISITS COMMUNICATION OF NEEDS Height & Weight Skin    Verbally 5\' 4"  (162.6 cm) 158 lbs. Normal          AMBULATORY STATUS RESPIRATION     (Limited Assist) Normal      Personal Care Assistance Level of Assistance               Functional Limitations Info                SPECIAL CARE FACTORS FREQUENCY  PT (By licensed PT), OT (By licensed OT)     PT Frequency: 5x/week OT Frequency: 5x/week           Additional Factors Info  Allergies, Insulin Sliding Scale   Allergies Info: Allergies: Lisinopril, Losartan, Sulfa Antibiotics   Insulin Sliding Scale Info: 6x/day       Current Medications (12/28/2014): Current Facility-Administered Medications  Medication Dose Route Frequency Provider Last Rate Last Dose  . acetaminophen (TYLENOL) tablet 1,000 mg  1,000 mg Oral BID PRN Allie Bossier, MD   1,000 mg at 12/27/14 0319  . ALPRAZolam Duanne Moron)  tablet 0.25 mg  0.25 mg Oral QHS PRN Nita Sells, MD      . azelastine (ASTELIN) 0.1 % nasal spray 2 spray  2 spray Each Nare QHS PRN Allie Bossier, MD      . insulin aspart (novoLOG) injection 0-5 Units  0-5 Units Subcutaneous QHS Dianne Dun, NP   5 Units at 12/27/14 2252  . insulin aspart (novoLOG) injection 0-9 Units  0-9 Units Subcutaneous 6 times per day Allie Bossier, MD   5 Units at 12/27/14 1600  . insulin glargine (LANTUS) injection 20 Units  20 Units Subcutaneous QHS Rhetta Mura Schorr, NP   20 Units at 12/27/14 2315  . latanoprost (XALATAN) 0.005 % ophthalmic solution 1 drop  1 drop Both  Eyes QHS Allie Bossier, MD   1 drop at 12/27/14 2304  . loratadine (CLARITIN) tablet 10 mg  10 mg Oral QHS Allie Bossier, MD   10 mg at 12/27/14 2250  . metoprolol tartrate (LOPRESSOR) tablet 12.5 mg  12.5 mg Oral BID Nita Sells, MD   12.5 mg at 12/27/14 2250  . mirabegron ER (MYRBETRIQ) tablet 50 mg  50 mg Oral QHS Allie Bossier, MD   50 mg at 12/27/14 2314  . nitroGLYCERIN (NITROSTAT) SL tablet 0.4 mg  0.4 mg Sublingual Q5 min PRN Allie Bossier, MD      . pantoprazole (PROTONIX) EC tablet 40 mg  40 mg Oral Daily Allie Bossier, MD   40 mg at 12/27/14 1001  . PARoxetine (PAXIL) tablet 10 mg  10 mg Oral Daily Nita Sells, MD   10 mg at 12/27/14 1008  . rosuvastatin (CRESTOR) tablet 40 mg  40 mg Oral QHS David L Rinehuls, PA-C   40 mg at 12/27/14 2250  . senna-docusate (Senokot-S) tablet 1 tablet  1 tablet Oral QHS PRN Allie Bossier, MD      . timolol (TIMOPTIC) 0.5 % ophthalmic solution 1 drop  1 drop Both Eyes Daily Allie Bossier, MD   1 drop at 12/27/14 1224  . warfarin (COUMADIN) tablet 5 mg  5 mg Oral Once per day on Sun Mon Tue Wed Thu Sat Wynell Balloon, RPH   5 mg at 12/27/14 1825  . [START ON 01/01/2015] warfarin (COUMADIN) tablet 7.5 mg  7.5 mg Oral Q Fri-1800 Wynell Balloon, RPH      . Warfarin - Pharmacist Dosing Inpatient   Does not apply [place], [place]       Do not use this list as official medication orders. Please verify with discharge summary.  Discharge Medications:   Medication List    ASK your doctor about these medications        acetaminophen 650 MG CR tablet  Commonly known as:  TYLENOL  Take 1,300 mg by mouth 2 (two) times daily as needed for pain.     ALPRAZolam 0.25 MG tablet  Commonly known as:  XANAX  Take 0.25 mg by mouth at bedtime as needed for sleep or anxiety.     AZELASTINE HCL NA  Place 1 spray into both nostrils at bedtime as needed (allergies/cough).     cholecalciferol 1000 UNITS tablet  Commonly known as:   VITAMIN D  Take 1,000 Units by mouth daily.     docusate sodium 100 MG capsule  Commonly known as:  COLACE  Take 100 mg by mouth daily as needed (constipation).     fexofenadine 180 MG tablet  Commonly  known as:  ALLEGRA  Take 180 mg by mouth at bedtime.     fish oil-omega-3 fatty acids 1000 MG capsule  Take 1 g by mouth daily.     HUMALOG KWIKPEN 100 UNIT/ML KiwkPen  Generic drug:  insulin lispro  Inject 1 Units into the skin 3 (three) times daily as needed (CBG >200).     insulin glargine 100 unit/mL Sopn  Commonly known as:  LANTUS  Inject 36 Units into the skin at bedtime.     ipratropium 0.06 % nasal spray  Commonly known as:  ATROVENT  Place 1 spray into the nose daily as needed (allergies/ congestion/cough).     levocetirizine 5 MG tablet  Commonly known as:  XYZAL  Take 5 mg by mouth at bedtime.     LUMIGAN 0.01 % Soln  Generic drug:  bimatoprost  Place 1 drop into both eyes at bedtime.     Magnesium 500 MG Caps  Take 500 mg by mouth daily as needed (constipation).     metFORMIN 1000 MG tablet  Commonly known as:  GLUCOPHAGE  Take 1,000 mg by mouth 2 (two) times daily with a meal.     metoprolol tartrate 25 MG tablet  Commonly known as:  LOPRESSOR  Take 0.5 tablets (12.5 mg total) by mouth 2 (two) times daily.     MYRBETRIQ 50 MG Tb24 tablet  Generic drug:  mirabegron ER  Take 50 mg by mouth at bedtime.     nitroGLYCERIN 0.4 MG SL tablet  Commonly known as:  NITROSTAT  Place 0.4 mg under the tongue every 5 (five) minutes as needed for chest pain.     nystatin cream  Commonly known as:  MYCOSTATIN  Apply 1 application topically daily.     omeprazole 20 MG capsule  Commonly known as:  PRILOSEC  Take 20 mg by mouth daily.     PARoxetine 10 MG tablet  Commonly known as:  PAXIL  Take 10 mg by mouth daily.     rOPINIRole 0.5 MG tablet  Commonly known as:  REQUIP  TAKE 1 TABLET AFTER DINNER EACH NIGHT.     rosuvastatin 20 MG tablet  Commonly  known as:  CRESTOR  Take 20 mg by mouth at bedtime.     sitaGLIPtin 50 MG tablet  Commonly known as:  JANUVIA  Take 50 mg by mouth at bedtime.     timolol 0.5 % ophthalmic solution  Commonly known as:  TIMOPTIC  Place 1 drop into both eyes daily.     warfarin 5 MG tablet  Commonly known as:  COUMADIN  Take 5-7.5 mg by mouth at bedtime. Take 1 1/2 tablets (7.5 mg) by mouth on Friday, take 1 tablet (5 mg) on Saturday, Sunday, Monday, Tuesday, Wednesday Thursday        Relevant Imaging Results:  Relevant Lab Results:  Recent Labs    Additional [place] Intern, [phone]

## 2014-12-28 NOTE — Progress Notes (Signed)
Pt refused cpap for the night, RT informed pt to call for RT if she changes her mind during the night.

## 2014-12-28 NOTE — Progress Notes (Signed)
For Social Work ... Please call Abigail Boyd at [phone] if Abigail Boyd  in rm  3w19 is released. The  Pt 's sister is picking her up from the hospital. Her daughter's number is [phone]. They prefer to go to [place], [place]. , [place] at [facility] and lastly, [place] @ [facility].

## 2014-12-28 NOTE — Progress Notes (Signed)
  Echocardiogram 2D Echocardiogram has been performed.  Abigail Boyd 12/28/2014, 11:13 AM

## 2014-12-28 NOTE — Progress Notes (Signed)
[place] for warfarin>>Edoxaban Indication: atrial fibrillation  Allergies  Allergen Reactions  . Lisinopril Cough  . Losartan Cough  . Sulfa Antibiotics Hives    Patient Measurements: Height: 5\' 4"  (162.6 cm) Weight: 158 lb 3.2 oz (71.759 kg) IBW/kg (Calculated) : 54.7   Vital Signs: Temp: 98 F (36.7 C) (10/31 1112) Temp Source: Oral (10/31 1112) BP: 148/58 mmHg (10/31 1112) Pulse Rate: 78 (10/31 1112)  Labs:  Recent Labs  12/26/14 1430 12/26/14 1441 12/26/14 1953 12/27/14 0735 12/28/14 0529  HGB 12.3 13.9 12.1  --  11.9*  HCT 37.1 41.0 37.5  --  35.7*  PLT 277  --  270  --  267  APTT 33  --   --   --   --   LABPROT 27.1*  --   --  26.9* 30.1*  INR 2.55*  --   --  2.53* 2.94*  CREATININE 0.87 0.90 0.98  --   --   TROPONINI  --   --  <0.03  --   --     Estimated Creatinine Clearance: 42.2 mL/min (by C-G formula based on Cr of 0.98).  Assessment: 81 yo female presenting with aphasia and right sided weakness. On warfaring PTA for A.fib. INR on admission 2.55. Transition to NOAC per Neuro note since stroke on therapeutic warfarin. Hg 11.9 stable, plt wnl. No bleed documented.  To start Xarelto per Samtani - ok to initiate when INR<3 (2.94 today). Will dose for CrCl<50  Goal of Therapy:  INR 2-3 Monitor platelets by anticoagulation protocol: Yes   Plan:  Xarelto 15mg  Qsupper  Monitor CBC, s/sx of bleeding   Elicia Lamp, PharmD Clinical Pharmacist Pager [phone] 12/28/2014 2:56 PM

## 2014-12-28 NOTE — Evaluation (Addendum)
Occupational Therapy Evaluation Patient Details Name: Abigail Boyd MRN: [id number] DOB: 1929-06-13 Today's Date: 12/28/2014    History of Present Illness Pt is a 81 y.o. F admitted w/ abnormal speech and Rt UE/LE weakness for 4 days.  On admission in a-fib w/ RVR which resolved. Pt's PMH includes unstable angina, syncope, DM II, CKD, spinal stenosis of lumbar region, anxiety, cornoary angioplasty w/ stent placement, Rt knee arthroscopy w/ medial menisectomy. MRI revealed Acute LEFT frontal cortical and subcortical infarction, 10 x 15 mm nonhemorrhagic.   Clinical Impression   Pt admitted with above. Pt required assist with socks, PTA. Feel pt will benefit from acute OT to increase independence and address RUE prior to d/c. D/c plan is SNF.    Follow Up Recommendations  SNF    Equipment Recommendations  Other (comment) (defer to next venue)    Recommendations for Other Services       Precautions / Restrictions Precautions Precautions: Fall Precaution Comments: h/o Rt knee buckle. h/o 6-7 falls in the past year. Restrictions Weight Bearing Restrictions: No      Mobility Bed Mobility               General bed mobility comments: not assessed  Transfers Overall transfer level: Needs assistance Equipment used: Rolling walker (2 wheeled) Transfers: Sit to/from Stand Sit to Stand: Min guard         General transfer comment: RW in front for support    Balance  History of falls. Used RW for ambulation in session.                                          ADL Overall ADL's : Needs assistance/impaired Eating/Feeding: Set up;Sitting   Grooming: Sitting;Supervision/safety;Wash/dry hands;Set up (applied sanitizer to hands)               Lower Body Dressing: Minimal assistance;With adaptive equipment;Sit to/from stand   Toilet Transfer: Ambulation;RW (Min guard-Min assist (assisted with RW 1x, otherwise Min guard for ambulation; Min guard-sit  to stand from chair)            Functional mobility during ADLs:  (Min guard-Min assist (assisted 1x with RW)) General ADL Comments: Encouraged pt to be using Rt hand. Explained activities she could be doing with Rt hand to work on coordination. OT educated on AE and pt practiced with reacher and sockaid.     Vision  Reports no change from baseline.   Perception     Praxis      Pertinent Vitals/Pain Pain Assessment: Faces Pain Score: 0-No pain     Hand Dominance Right   Extremity/Trunk Assessment Upper Extremity Assessment Upper Extremity Assessment: RUE deficits/detail (weakness in bilateral shoulder flexion-able to withstand some resistance with both); RUE drifts down when holding out in front RUE Sensation: decreased light touch RUE Coordination: decreased fine motor;decreased gross motor   Lower Extremity Assessment Lower Extremity Assessment: Defer to PT evaluation       Communication Communication Communication: Expressive difficulties   Cognition Arousal/Alertness: Awake/alert Behavior During Therapy: WFL for tasks assessed/performed Overall Cognitive Status: No family/caregiver present to determine baseline cognitive functioning                     General Comments       Exercises       Shoulder Instructions      Home Living Family/patient  expects to be discharged to:: Skilled nursing facility Living Arrangements: Alone Available Help at Discharge: Family;Available PRN/intermittently (multiple family members who live nearby but are working) Type of Home: House Home Access: Stairs to enter Technical brewer of Steps: 1 Entrance Stairs-Rails: Right (Rt grab bar) Home Layout: Other (Comment) (1 step up from office into main house w/o rail or grab bar)     Bathroom Shower/Tub: Walk-in shower         Home Equipment: Environmental consultant - 4 wheels;Cane - single point;Bedside commode;Adaptive equipment (lift chair) Adaptive Equipment: Reacher     Lives With: Alone    Prior Functioning/Environment Level of Independence: Needs assistance    ADL's / Homemaking Assistance Needed: assist with socks   Comments: PTA, pt using SPC out in community and RW inside home.  Uses SPC to walk car<>house and then transfers to RW once inside.  Pt's son in law initially noticed pt's slurred speech which led her to come to the hospital.  Pt has grab pole on side of bed that assists her w/ pulling up to sitting    OT Diagnosis: Generalized weakness   OT Problem List: Decreased strength;Decreased coordination;Decreased cognition;Decreased knowledge of use of DME or AE;Decreased knowledge of precautions;Impaired UE functional use;Impaired sensation   OT Treatment/Interventions: Self-care/ADL training;DME and/or AE instruction;Therapeutic activities;Cognitive remediation/compensation;Patient/family education;Balance training;Therapeutic exercise    OT Goals(Current goals can be found in the care plan section) Acute Rehab OT Goals Patient Stated Goal: go home OT Goal Formulation: With patient Time For Goal Achievement: 01/04/15 Potential to Achieve Goals: Good ADL Goals Pt Will Perform Grooming: with set-up;with supervision;standing Pt Will Perform Lower Body Dressing: with set-up;with supervision;sit to/from stand;with adaptive equipment Pt Will Transfer to Toilet: with supervision;ambulating Pt Will Perform Toileting - Clothing Manipulation and hygiene: with set-up;with supervision;sit to/from stand Additional ADL Goal #1: Pt will independently perform HEP for RUE to increase strength and coordination.   OT Frequency: Min 2X/week   Barriers to D/C:            Co-evaluation              End of Session Equipment Utilized During Treatment: Gait belt;Rolling walker Nurse Communication: Other (comment) (notified tech about pt's food/coffee (cold))  Activity Tolerance: Patient tolerated treatment well Patient left: with chair alarm set;in  chair;with call bell/phone within reach   Time: 0922-0942 OT Time Calculation (min): 20 min Charges:  OT General Charges $OT Visit: 1 Procedure OT Evaluation $Initial OT Evaluation Tier I: 1 Procedure G-CodesBenito Mccreedy OTR/L [id number] 12/28/2014, 11:00 AM

## 2014-12-29 ENCOUNTER — Encounter (HOSPITAL_COMMUNITY): Payer: Self-pay

## 2014-12-29 DIAGNOSIS — I63031 Cerebral infarction due to thrombosis of right carotid artery: Secondary | ICD-10-CM

## 2014-12-29 DIAGNOSIS — I63032 Cerebral infarction due to thrombosis of left carotid artery: Secondary | ICD-10-CM

## 2014-12-29 LAB — GLUCOSE, CAPILLARY
GLUCOSE-CAPILLARY: 278 mg/dL — AB (ref 65–99)
Glucose-Capillary: 197 mg/dL — ABNORMAL HIGH (ref 65–99)

## 2014-12-29 MED ORDER — ALPRAZOLAM 0.25 MG PO TABS: 0.2500 mg | ORAL_TABLET | Freq: Every evening | ORAL | Status: DC | PRN

## 2014-12-29 MED ORDER — ROSUVASTATIN CALCIUM 40 MG PO TABS: 40.0000 mg | ORAL_TABLET | Freq: Every day | ORAL | Status: DC

## 2014-12-29 MED ORDER — RIVAROXABAN 15 MG PO TABS: 15.0000 mg | ORAL_TABLET | Freq: Every day | ORAL | Status: DC

## 2014-12-29 NOTE — Care Management Important Message (Signed)
Important Message  Patient Details  Name: Abigail Boyd MRN: [id number] Date of Birth: [birth year]   Medicare Important Message Given:  Yes-second notification given    Nathen May 12/29/2014, 10:16 AM

## 2014-12-29 NOTE — Discharge Summary (Signed)
Physician Discharge Summary  Abigail Boyd JKK:[id number] DOB: [birth year] DOA: 12/26/2014  PCP: Morton Peters, MD  Admit date: 12/26/2014 Discharge date: 12/29/2014  Time spent: 35 minutes  Recommendations for Outpatient Follow-up:  1. Changed from Coumadin to Xarelto this admission-need this life long at dose of 15 mg od contingent on Renal indices 2. Patient to d/c to SNF  3. adjust and monitor CBG's 4. suggest OP CMEt and cbc 1 week at SNF   Discharge Diagnoses:  Active Problems:   Stroke (cerebrum) (HCC)   CVA (cerebral infarction)   CAD in native artery   Cerebrovascular accident (CVA) ([place])   CKD (chronic kidney disease), stage II   HLD (hyperlipidemia)   OSA (obstructive sleep apnea)   Paroxysmal atrial fibrillation ([place])   Tachycardia-bradycardia syndrome ([place])   Uncontrolled type 2 diabetes mellitus with complication, with long-term current use of insulin ([place])   Discharge Condition: fair  Diet recommendation: hh diabetic diet  Filed Weights   12/26/14 1424 12/27/14 0600 12/28/14 0302  Weight: 73.891 kg (162 lb 14.4 oz) 73.528 kg (162 lb 1.6 oz) 71.759 kg (158 lb 3.2 oz)    History of present illness:  81 y/o ? Prior CAD 2013 PCI->OM Afib/Tachy-brady syndrome, 09/2011 [? Ablation done at [facility]] CHad2Vasc2=6 on AC-sees Dr. Caryl Comes CKD iii HLD OSA Ty ii dm Prior CVA Hypothyroid with goiter  Admitted to ED 10/29 with abn speech x4 days prior to admit. + R arm and leg weakness Main issue was diffculty with speech and expressive aphasia. No other specific current concenrs   On admission noted to be in Afib c RVR which resolved  Hospital Course:   1. New CVA-acute L frontal cortical CVA 10 x 15 mm. Deficits mainly speech and mild R sided weakness. No other deficit. therapy consults rec d/c to SNF.  Patient to get choices per S/w today . Echo EF 65-70 Grade 1 DD [no acute component] and US shows no pathology. change to NOAC Xarelto 15 od as had CVA  on therapeutic INR level 2. Afib c h/o Tachy-brady- had RVR on admit-now rate controlled-converted on admission. D/c Cardizem Gtt. Re-start home metoprolol 12.5 bid. Monitor on tele-PVC's only. see above re: AC 3. HLD-continue Crestor 20 qhs-->40 qhs ashad CVA. LDL this admissions was 98 4. Depression-Paxil 10 mg daily, xanax 0.25 qhs 5. DM ty ii-suagrs 250-300 range.Aic 8.4 indicating only mod control Continue SSI qid achs as has passed swallow eval. Home meds include lantus 36--increased to Mod s coverage ssi. Also on metformin 1000, and Sitagliptin 50 qhs bid which were resumed on d/c home 6. CKD 1 2/2 to DM./htn-monitor am labs 7. Glaucoma-cont Gtt for eyes 8. Allergies-hold fexofenadine and xyzal for now 9. Gerd-cont Protonix as sub for Omeprazole 10. RLS-hold Ropinirole 0.5 qhs for now   Consultations:   neurology  Discharge Exam: Filed Vitals:   12/29/14 0416  BP: 153/56  Pulse: 65  Temp: 98 F (36.7 C)  Resp: 6    Sleepy but awakening No further noted deficit to R arm Speech clear No other overnight concerns   Cardiovascular: s1 s2 no m/r/g Respiratory: clear no added sound Power on r side improved. No speech deficits  Discharge Instructions   Discharge Instructions    Diet - low sodium heart healthy    Complete by:  As directed      Increase activity slowly    Complete by:  As directed  Current Discharge Medication List    START taking these medications   Details  Rivaroxaban (XARELTO) 15 MG TABS tablet Take 1 tablet (15 mg total) by mouth daily with supper. Qty: 30 tablet, Refills: 0      CONTINUE these medications which have CHANGED   Details  rosuvastatin (CRESTOR) 40 MG tablet Take 1 tablet (40 mg total) by mouth at bedtime. Qty: 30 tablet, Refills: 0      CONTINUE these medications which have NOT CHANGED   Details  acetaminophen (TYLENOL) 650 MG CR tablet Take 1,300 mg by mouth 2 (two) times daily as needed for pain.     ALPRAZolam (XANAX) 0.25 MG tablet Take 0.25 mg by mouth at bedtime as needed for sleep or anxiety.     AZELASTINE HCL NA Place 1 spray into both nostrils at bedtime as needed (allergies/cough).    bimatoprost (LUMIGAN) 0.01 % SOLN Place 1 drop into both eyes at bedtime.    cholecalciferol (VITAMIN D) 1000 UNITS tablet Take 1,000 Units by mouth daily.    docusate sodium (COLACE) 100 MG capsule Take 100 mg by mouth daily as needed (constipation).     fexofenadine (ALLEGRA) 180 MG tablet Take 180 mg by mouth at bedtime.     fish oil-omega-3 fatty acids 1000 MG capsule Take 1 g by mouth daily.     insulin glargine (LANTUS) 100 unit/mL SOPN Inject 36 Units into the skin at bedtime.     insulin lispro (HUMALOG KWIKPEN) 100 UNIT/ML KiwkPen Inject 1 Units into the skin 3 (three) times daily as needed (CBG >200).    ipratropium (ATROVENT) 0.06 % nasal spray Place 1 spray into the nose daily as needed (allergies/ congestion/cough).     levocetirizine (XYZAL) 5 MG tablet Take 5 mg by mouth at bedtime. Refills: 3    Magnesium 500 MG CAPS Take 500 mg by mouth daily as needed (constipation).     metFORMIN (GLUCOPHAGE) 1000 MG tablet Take 1,000 mg by mouth 2 (two) times daily with a meal.    metoprolol tartrate (LOPRESSOR) 25 MG tablet Take 0.5 tablets (12.5 mg total) by mouth 2 (two) times daily. Qty: 60 tablet, Refills: 5    mirabegron ER (MYRBETRIQ) 50 MG TB24 tablet Take 50 mg by mouth at bedtime.     nitroGLYCERIN (NITROSTAT) 0.4 MG SL tablet Place 0.4 mg under the tongue every 5 (five) minutes as needed for chest pain.    nystatin cream (MYCOSTATIN) Apply 1 application topically daily. Refills: 1    omeprazole (PRILOSEC) 20 MG capsule Take 20 mg by mouth daily.    PARoxetine (PAXIL) 10 MG tablet Take 10 mg by mouth daily.     rOPINIRole (REQUIP) 0.5 MG tablet TAKE 1 TABLET AFTER DINNER EACH NIGHT. Qty: 30 tablet, Refills: 3    sitaGLIPtin (JANUVIA) 50 MG tablet Take 50 mg by  mouth at bedtime.       STOP taking these medications     timolol (TIMOPTIC) 0.5 % ophthalmic solution      warfarin (COUMADIN) 5 MG tablet        Allergies  Allergen Reactions  . Lisinopril Cough  . Losartan Cough  . Sulfa Antibiotics Hives      The results of significant diagnostics from this hospitalization (including imaging, microbiology, ancillary and laboratory) are listed below for reference.    Significant Diagnostic Studies: Ct Head Wo Contrast  12/26/2014  CLINICAL DATA:  Numbness and weakness of the right hand and leg. Slurred speech. The onset  of symptoms was 4 days ago. EXAM: CT HEAD WITHOUT CONTRAST TECHNIQUE: Contiguous axial images were obtained from the base of the skull through the vertex without intravenous contrast. COMPARISON:  None. FINDINGS: Brain: No evidence of acute cortical infarction, hemorrhage, extra-axial collection, ventriculomegaly, or mass effect. There is moderate brain parenchymal atrophy and chronic small vessel disease changes. More focal area of hypoattenuation is seen in the left supratentorial subcortical white matter, underneath the central sulcus. Vascular: No hyperdense vessel or unexpected calcification. Skull: Negative for fracture or focal lesion. Sinuses/Orbits: No acute findings. Other: Pinial gland calcifications are seen. IMPRESSION: Area of hypoattenuation in the subcortical white matter of the left supratentorial brain. Acute infarction cannot be excluded. Further evaluation with MRI of the brain may be considered. Chronic brain parenchymal atrophy and deep white matter microvascular disease. These results were called by telephone at the time of interpretation on 12/26/2014 at 4:36 pm to Ms. Chrisco, RN from [facility] emergency department, who verbally acknowledged these results. Electronically Signed   By: Fidela Salisbury M.D.   On: 12/26/2014 16:40   Mr Brain Wo Contrast  12/26/2014  CLINICAL DATA:  Speech difficulty and RIGHT  hemiparesis. Symptoms began 4 days ago. Patient is anticoagulated. EXAM: MRI HEAD WITHOUT CONTRAST MRA HEAD WITHOUT CONTRAST TECHNIQUE: Multiplanar, multiecho pulse sequences of the brain and surrounding structures were obtained without intravenous contrast. Angiographic images of the head were obtained using MRA technique without contrast. COMPARISON:  CT head earlier today. FINDINGS: MRI HEAD FINDINGS As suspected from CT, there is an ovoid 10 x 15 mm area of restricted diffusion representing acute infarction involving the LEFT frontal cortex and subcortical white matter. This is nonhemorrhagic. No other areas of acute infarction are seen. No mass lesion.  No extra-axial fluid. Global atrophy. Hydrocephalus ex vacuo. Extensive T2 and FLAIR hyperintensity throughout the white matter consistent with small vessel disease. Scattered areas of remote lacunar infarction. Flow is maintained in the internal carotid arteries, basilar artery, and both vertebral arteries. Unremarkable pituitary and cerebellar tonsils. No osseous findings. BILATERAL cataract extraction. No sinus or mastoid disease. Extracranial soft tissues unremarkable. MRA HEAD FINDINGS Minor non-stenotic irregularity of both internal carotid arteries. Basilar artery widely patent with vertebrals codominant, and widely patent. No significant proximal stenosis of the anterior, middle, or posterior cerebral arteries. No cerebellar branch occlusion. No intracranial aneurysm is evident. Moderately diseased RIGHT anterior cerebral artery in its distal A2 segment. Moderately diseased BILATERAL middle cerebral artery M3 branches, which on the LEFT could contribute to the observed pattern of infarction. IMPRESSION: Acute LEFT frontal cortical and subcortical infarction, 10 x 15 mm, nonhemorrhagic. Severe atrophy with extensive small vessel disease. No proximal large vessel stenosis or occlusion. Intracranial atherosclerotic change involves the distal anterior and  middle cerebral artery branches. Electronically Signed   By: Staci Righter M.D.   On: 12/26/2014 21:35   Mr Jodene Nam Head/brain Wo Cm  12/26/2014  CLINICAL DATA:  Speech difficulty and RIGHT hemiparesis. Symptoms began 4 days ago. Patient is anticoagulated. EXAM: MRI HEAD WITHOUT CONTRAST MRA HEAD WITHOUT CONTRAST TECHNIQUE: Multiplanar, multiecho pulse sequences of the brain and surrounding structures were obtained without intravenous contrast. Angiographic images of the head were obtained using MRA technique without contrast. COMPARISON:  CT head earlier today. FINDINGS: MRI HEAD FINDINGS As suspected from CT, there is an ovoid 10 x 15 mm area of restricted diffusion representing acute infarction involving the LEFT frontal cortex and subcortical white matter. This is nonhemorrhagic. No other areas of acute infarction are  seen. No mass lesion.  No extra-axial fluid. Global atrophy. Hydrocephalus ex vacuo. Extensive T2 and FLAIR hyperintensity throughout the white matter consistent with small vessel disease. Scattered areas of remote lacunar infarction. Flow is maintained in the internal carotid arteries, basilar artery, and both vertebral arteries. Unremarkable pituitary and cerebellar tonsils. No osseous findings. BILATERAL cataract extraction. No sinus or mastoid disease. Extracranial soft tissues unremarkable. MRA HEAD FINDINGS Minor non-stenotic irregularity of both internal carotid arteries. Basilar artery widely patent with vertebrals codominant, and widely patent. No significant proximal stenosis of the anterior, middle, or posterior cerebral arteries. No cerebellar branch occlusion. No intracranial aneurysm is evident. Moderately diseased RIGHT anterior cerebral artery in its distal A2 segment. Moderately diseased BILATERAL middle cerebral artery M3 branches, which on the LEFT could contribute to the observed pattern of infarction. IMPRESSION: Acute LEFT frontal cortical and subcortical infarction, 10 x 15  mm, nonhemorrhagic. Severe atrophy with extensive small vessel disease. No proximal large vessel stenosis or occlusion. Intracranial atherosclerotic change involves the distal anterior and middle cerebral artery branches. Electronically Signed   By: Staci Righter M.D.   On: 12/26/2014 21:35    Microbiology: No results found for this or any previous visit (from the past 240 hour(s)).   Labs: Basic Metabolic Panel:  Recent Labs Lab 12/26/14 1430 12/26/14 1441 12/26/14 1953  NA 137 142 142  K 4.1 4.1 4.3  CL 100* 101 104  CO2 26  --  23  GLUCOSE 169* 168* 100*  BUN 18 21* 20  CREATININE 0.87 0.90 0.98  CALCIUM 9.7  --  9.9   Liver Function Tests:  Recent Labs Lab 12/26/14 1430 12/26/14 1953  AST 20 23  ALT 17 15  ALKPHOS 53 49  BILITOT 0.2* 0.4  PROT 6.9 6.8  ALBUMIN 3.7 3.5   No results for input(s): LIPASE, AMYLASE in the last 168 hours. No results for input(s): AMMONIA in the last 168 hours. CBC:  Recent Labs Lab 12/26/14 1430 12/26/14 1441 12/26/14 1953 12/28/14 0529  WBC 6.5  --  8.3 8.3  NEUTROABS 4.6  --  5.5  --   HGB 12.3 13.9 12.1 11.9*  HCT 37.1 41.0 37.5 35.7*  MCV 93.2  --  94.0 92.7  PLT 277  --  270 267   Cardiac Enzymes:  Recent Labs Lab 12/26/14 1953  TROPONINI <0.03   BNP: BNP (last 3 results) No results for input(s): BNP in the last 8760 hours.  ProBNP (last 3 results) No results for input(s): PROBNP in the last 8760 hours.  CBG:  Recent Labs Lab 12/28/14 0728 12/28/14 1109 12/28/14 1617 12/28/14 2201 12/29/14 0733  GLUCAP 255* 309* 372* 212* 197*       Signed:  Nita Sells  [name] 12/29/2014, 8:03 AM

## 2014-12-29 NOTE — Discharge Instructions (Signed)
Information on my medicine - XARELTO (Rivaroxaban)  This medication education was reviewed with me or my healthcare representative as part of my discharge preparation.  The pharmacist that spoke with me during my hospital stay was:  Romona Curls, [facility]  Why was Xarelto prescribed for you? Xarelto was prescribed for you to reduce the risk of a blood clot forming that can cause a stroke if you have a medical condition called atrial fibrillation (a type of irregular heartbeat).  What do you need to know about xarelto ? Take your Xarelto ONCE DAILY at the same time every day with your evening meal. If you have difficulty swallowing the tablet whole, you may crush it and mix in applesauce just prior to taking your dose.  Take Xarelto exactly as prescribed by your doctor and DO NOT stop taking Xarelto without talking to the doctor who prescribed the medication.  Stopping without other stroke prevention medication to take the place of Xarelto may increase your risk of developing a clot that causes a stroke.  Refill your prescription before you run out.  After discharge, you should have regular check-up appointments with your healthcare provider that is prescribing your Xarelto.  In the future your dose may need to be changed if your kidney function or weight changes by a significant amount.  What do you do if you miss a dose? If you are taking Xarelto ONCE DAILY and you miss a dose, take it as soon as you remember on the same day then continue your regularly scheduled once daily regimen the next day. Do not take two doses of Xarelto at the same time or on the same day.   Important Safety Information A possible side effect of Xarelto is bleeding. You should call your healthcare provider right away if you experience any of the following: ? Bleeding from an injury or your nose that does not stop. ? Unusual colored urine (red or dark brown) or unusual colored stools (red or black). ? Unusual  bruising for unknown reasons. ? A serious fall or if you hit your head (even if there is no bleeding).  Some medicines may interact with Xarelto and might increase your risk of bleeding while on Xarelto. To help avoid this, consult your healthcare provider or pharmacist prior to using any new prescription or non-prescription medications, including herbals, vitamins, non-steroidal anti-inflammatory drugs (NSAIDs) and supplements.  This website has more information on Xarelto: [email or web address]

## 2014-12-29 NOTE — Progress Notes (Signed)
STROKE TEAM PROGRESS NOTE   SUBJECTIVE (INTERVAL HISTORY) No new complaints.    OBJECTIVE Temp:  [98 F (36.7 C)-99 F (37.2 C)] 98.5 F (36.9 C) (11/01 0927) Pulse Rate:  [65-78] 69 (11/01 0838) Cardiac Rhythm:  [-] Normal sinus rhythm (11/01 0700) Resp:  [6-16] 6 (11/01 0416) BP: (140-168)/(56-70) 140/70 mmHg (11/01 0838) SpO2:  [95 %-98 %] 97 % (11/01 0416)  CBC:  Recent Labs Lab 12/26/14 1430  12/26/14 1953 12/28/14 0529  WBC 6.5  --  8.3 8.3  NEUTROABS 4.6  --  5.5  --   HGB 12.3  < > 12.1 11.9*  HCT 37.1  < > 37.5 35.7*  MCV 93.2  --  94.0 92.7  PLT 277  --  270 267  < > = values in this interval not displayed.  Basic Metabolic Panel:   Recent Labs Lab 12/26/14 1430 12/26/14 1441 12/26/14 1953  NA 137 142 142  K 4.1 4.1 4.3  CL 100* 101 104  CO2 26  --  23  GLUCOSE 169* 168* 100*  BUN 18 21* 20  CREATININE 0.87 0.90 0.98  CALCIUM 9.7  --  9.9    Lipid Panel:     Component Value Date/Time   CHOL 163 12/27/2014 0735   CHOL 177 12/18/2012 0941   TRIG 131 12/27/2014 0735   HDL 39* 12/27/2014 0735   HDL 50 12/18/2012 0941   CHOLHDL 4.2 12/27/2014 0735   CHOLHDL 3.5 12/18/2012 0941   VLDL 26 12/27/2014 0735   LDLCALC 98 12/27/2014 0735   LDLCALC 90 12/18/2012 0941   HgbA1c:  Lab Results  Component Value Date   HGBA1C 8.4* 12/27/2014    IMAGING   Ct Head Wo Contrast 12/26/2014   Area of hypoattenuation in the subcortical white matter of the left supratentorial brain. Acute infarction cannot be excluded. Further evaluation with MRI of the brain may be considered. Chronic brain parenchymal atrophy and deep white matter microvascular disease.  Mr Abigail Boyd Head/brain Wo Cm 12/26/2014   Acute LEFT frontal cortical and subcortical infarction, 10 x 15 mm, nonhemorrhagic. Severe atrophy with extensive small vessel disease. No proximal large vessel stenosis or occlusion. Intracranial atherosclerotic change involves the distal anterior and middle cerebral  artery branches.   Carotid Doppler   There is 1-39% bilateral ICA stenosis. Vertebral artery flow is antegrade.    2D Echocardiogram  - Left ventricle: The cavity size was normal. Wall thickness wasincreased in a pattern of moderate LVH. Systolic function wasvigorous. The estimated ejection fraction was in the range of 65%to 70%. Wall motion was normal; there were no regional wallmotion abnormalities. Doppler parameters are consistent withabnormal left ventricular relaxation (grade 1 diastolicdysfunction). - Aortic valve: Valve area (VTI): 1.71 cm^2. Valve area (Vmax):1.45 cm^2. Valve area (Vmean): 1.37 cm^2.   PHYSICAL EXAM HEENT- Normocephalic, no lesions, without obvious abnormality. Normal external eye and conjunctiva. Normal external nose, mucus membranes and septum. Normal pharynx. Cardiovascular- irregular;   Lungs- chest clear, no wheezing, rales, normal symmetric air entry Abdomen- soft, non-tender; bowel sounds normal; no masses, no organomegaly Extremities- no edema Lymph-no adenopathy palpable Musculoskeletal-right knee tenderness Skin-warm and dry, no hyperpigmentation, vitiligo, or suspicious lesions  Neurological Examination Mental Status: Alert, oriented, thought content appropriate. Speech fluent without evidence of aphasia. Able to follow 3 step commands without difficulty. Cranial Nerves: II: Discs flat bilaterally; Visual fields grossly normal, pupils equal, round, reactive to light and accommodation III,IV, VI: ptosis not present, extra-ocular motions intact bilaterally V,VII: decrease in the  right NLF, facial light touch sensation normal bilaterally VIII: hearing normal bilaterally IX,X: gag reflex present XI: bilateral shoulder shrug XII: midline tongue extension  Motor: Right :Upper extremity 5/5 with pronator drift and 5-/5 hand gripLeft: Upper extremity 5/5 Lower extremity  3/5Lower extremity 5/5 Tone and bulk:normal tone throughout; no atrophy noted Sensory: Light touch decreased in the right hand Cerebellar: normal finger-to-nose and normal heel-to-shin testing bilaterally Gait: not tested due to safety concerns   ASSESSMENT/PLAN [name] is a 81 y.o. female with history of coronary artery disease status post angioplasty, sinus node dysfunction, paroxysmal atrial fibrillation on coumadin, acute bradycardia syndrome, hypertension, hyperlipidemia, obstructive sleep apnea, diabetes mellitus, and chronic kidney disease stage II  presenting with dysarthria, numbness and poor control of the right hand.  She did not receive IV t-PA due to late presentation.  Stroke:  Dominant infarct probably embolic secondary to paroxysmal atrial fibrillation with therapeutic INR.  Resultant  Right hand and face weakness, expressive aphasia  MRI - Acute LEFT frontal cortical and subcortical infarction, 10 x 15 mm, nonhemorrhagic.  MRA No proximal large vessel stenosis or occlusion.  Carotid Doppler - No significant stenosis   2D Echo  No source of embolus   INR - 2.53  LDL 98  HgbA1c 8.4  VTE prophylaxis - pharmacy following, for new edoxaban  Diet Carb Modified Fluid consistency:: Thin; Room service appropriate?: Yes Diet - low sodium heart healthy  warfarin daily prior to admission, continued in hospital, now on warfarin daily. Recommend change to NOAC - pharmacy following for change to Edoxaban  Patient counseled to be compliant with her antithrombotic medications  Ongoing aggressive stroke risk factor management  Therapy recommendations: SNF  Disposition:  SNf planned NOTHING FURTHER TO ADD FROM THE STROKE STANDPOINT Patient has a 10-15% risk of having another stroke over the next year, the highest risk is within 2 weeks of the most recent  stroke/TIA (risk of having a stroke following a stroke or TIA is the same). Ongoing risk factor control by Primary Care Physician Stroke Service will sign off. Please call should any needs arise. Follow-up Stroke Clinic at [facility] with Dr. Antony Contras in 2 months, order placed.  Atrial Fibrillation  Home anticoagulation:  warfarin initially continued in the hospital  INR 2.55 on admission  Changed to Edoxaban for discharge. Patient agreeable she can meet copay.   Hypertension  Occasional high blood pressures  Permissive hypertension (OK if < 220/120) but gradually normalize in 5-7 days  Hyperlipidemia  Home meds:  Crestor 20 mg daily resumed in hospital  LDL 98, goal < 70  Increased Crestor to 40 mg daily  Continue statin at discharge  Diabetes, uncontrolled  HgbA1c 8.4, goal < 7.0  Other Stroke Risk Factors  Advanced age  Hx previous stroke  Family hx stroke (Father)  Coronary artery disease  Obstructive sleep apnea, refuses CPAP  Other active problems  Depression on paxil  CKD stage 1 d/t diabetes  Glaucoma  Allergies   GERD  RLS  Hospital day # [place] for Pager information 12/29/2014 10:57 AM   I have personally examined this patient, reviewed notes, independently viewed imaging studies, participated in medical decision making and plan of care. I have made any additions or clarifications directly to the above note. Agree with note above.  Stroke team will sign off. Follow-up as an outpatient in stroke clinic in 2 months.  Antony Contras, MD Medical  Director [facility] Pager: [phone] 12/29/2014 3:19 PM  To contact Stroke Continuity provider, please refer to [email or web address] After hours, contact General Neurology

## 2014-12-29 NOTE — Care Management Note (Addendum)
Case Management Note  Patient Details  Name: Abigail Boyd MRN: [id number] Date of Birth: 01-20-1930  Subjective/Objective: Pt admitted for Stroke. Plan for SNF today.                    Action/Plan: CSW to assist with disposition needs. No needs identified for CM at this time.    Expected Discharge Date:                  Expected Discharge Plan:  Skilled Nursing Facility  In-House Referral:  Clinical Social Work  Discharge planning Services  CM Consult  Post Acute Care Choice:  NA Choice offered to:  NA  DME Arranged:  N/A DME Agency:  NA  HH Arranged:  NA HH Agency:  NA  Status of Service:  Completed, signed off  Medicare Important Message Given:    Date Medicare IM Given:    Medicare IM give by:    Date Additional Medicare IM Given:    Additional Medicare Important Message give by:     If discussed at [place] of Stay Meetings, dates discussed:    Additional Comments: S/W JEAN @ [employer] [facility] # [phone]   ELIQUIS 5 MG BID DAILY   COVER- YES  CO-PAY-$ 9.45 UP TO $ 45.00  TIER- E DR[facility]  PRIOR APPROVAL - YES # [phone]  PHARMACY : ANY RETAIL   [name], RN 12/29/2014, 9:45 AM

## 2014-12-29 NOTE — Progress Notes (Signed)
Attempted to call for report at [place] x 2. None answering phone. Will continue to  Call.

## 2014-12-29 NOTE — Clinical Social Work Placement (Signed)
   CLINICAL SOCIAL WORK PLACEMENT  NOTE  Date:  12/29/2014  Patient Details  Name: Abigail Boyd MRN: [id number] Date of Birth: 1929/12/16  Clinical Social Work is seeking post-discharge placement for this patient at the [place] level of care (*CSW will initial, date and re-position this form in  chart as items are completed):  Yes   Patient/family provided with [facility] Work Department's list of facilities offering this level of care within the geographic area requested by the patient (or if unable, by the patient's family).  Yes   Patient/family informed of their freedom to choose among providers that offer the needed level of care, that participate in Medicare, Medicaid or managed care program needed by the patient, have an available bed and are willing to accept the patient.  Yes   Patient/family informed of [facility]'s ownership interest in [facility] and [facility], as well as of the fact that they are under no obligation to receive care at these facilities.  PASRR submitted to EDS on 12/28/14     PASRR number received on 12/28/14     Existing PASRR number confirmed on       FL2 transmitted to all facilities in geographic area requested by pt/family on 12/28/14     FL2 transmitted to all facilities within larger geographic area on       Patient informed that his/her managed care company has contracts with or will negotiate with certain facilities, including the following:        Yes   Patient/family informed of bed offers received.  Patient chooses bed at [facility]     Physician recommends and patient chooses bed at      Patient to be transferred to [facility] on 12/29/14.  Patient to be transferred to facility by Sister will transport     Patient family notified on 12/29/14 of transfer.  Name of family member notified:  [name] and [name]     PHYSICIAN       Additional Comment:  Per MD patient ready for DC to  [facility]. RN, patient, patient's family, and facility notified of DC. RN given number for report. DC packet on chart. Patient and family state that the patient will go to the facility by personal vehicle. CSW signing off.   _______________________________________________ Liz Beach MSW, [place], [place], [phone]

## 2014-12-30 ENCOUNTER — Non-Acute Institutional Stay (SKILLED_NURSING_FACILITY): Payer: Medicare Other | Admitting: Nurse Practitioner

## 2014-12-30 DIAGNOSIS — E785 Hyperlipidemia, unspecified: Secondary | ICD-10-CM

## 2014-12-30 DIAGNOSIS — K5900 Constipation, unspecified: Secondary | ICD-10-CM | POA: Diagnosis not present

## 2014-12-30 DIAGNOSIS — E1165 Type 2 diabetes mellitus with hyperglycemia: Secondary | ICD-10-CM

## 2014-12-30 DIAGNOSIS — Z794 Long term (current) use of insulin: Secondary | ICD-10-CM

## 2014-12-30 DIAGNOSIS — I10 Essential (primary) hypertension: Secondary | ICD-10-CM | POA: Diagnosis not present

## 2014-12-30 DIAGNOSIS — I63032 Cerebral infarction due to thrombosis of left carotid artery: Secondary | ICD-10-CM

## 2014-12-30 DIAGNOSIS — N182 Chronic kidney disease, stage 2 (mild): Secondary | ICD-10-CM

## 2014-12-30 DIAGNOSIS — I2510 Atherosclerotic heart disease of native coronary artery without angina pectoris: Secondary | ICD-10-CM | POA: Diagnosis not present

## 2014-12-30 DIAGNOSIS — I480 Paroxysmal atrial fibrillation: Secondary | ICD-10-CM

## 2014-12-30 DIAGNOSIS — IMO0002 Reserved for concepts with insufficient information to code with codable children: Secondary | ICD-10-CM

## 2014-12-30 DIAGNOSIS — E118 Type 2 diabetes mellitus with unspecified complications: Secondary | ICD-10-CM

## 2014-12-30 NOTE — Progress Notes (Signed)
Patient ID: Abigail Boyd, female   DOB: [birth year], 81 y.o.   MRN: [id number]    Nursing Home Location:  [place] of Service: SNF (31)  PCP: Morton Peters, MD  Allergies  Allergen Reactions  . Lisinopril Cough  . Losartan Cough  . Sulfa Antibiotics Hives    Chief Complaint  Patient presents with  . Hospitalization Follow-up    HPI:  Patient is a 81 y.o. female seen today at [facility] to follow up hospitalization. She has hx of DM, GERD, HTN, HLD, RLS.  Pt was hospitalized with acute left frontal CVA with dysarthria and right sided weakness. She was started on xarelto. She also had afib with RVR and required cardizem drip. Rate is currently controlled on PO medication. conts on xarelto for anticoagulation. Pt is at [facility] short term re[facility].  Review of Systems:  Review of Systems  Constitutional: Negative for activity change, appetite change, fatigue and unexpected weight change.  HENT: Negative for congestion and hearing loss.   Eyes: Negative.   Respiratory: Negative for cough and shortness of breath.   Cardiovascular: Negative for chest pain, palpitations and leg swelling.  Gastrointestinal: Positive for constipation. Negative for abdominal pain and diarrhea.  Genitourinary: Negative for dysuria and difficulty urinating.  Musculoskeletal: Negative for myalgias and arthralgias.  Skin: Negative for color change and wound.  Neurological: Positive for weakness. Negative for dizziness.  Psychiatric/Behavioral: Negative for behavioral problems, confusion and agitation.    Past Medical History  Diagnosis Date  . History of unstable angina 06/13/2011    Jaw pain awakening from sleep -- Myoview --CATH --> PCI  . Abnormal nuclear stress test 06/21/2011    Inferolateral reversible defect;--> cardiac cath & PCI of CxOM, occluded RCA with collaterals;; followup Myoview 11/2012: Low risk. Fixed basal inferior artifact normal  EF. No ischemia  . CAD S/P percutaneous coronary angioplasty     s/P PCI to proximal OM1 w/ DES; occluded RCA with bridging and L-R collaterals (medical management)  . Shortness of breath on exertion October 2013    2-D echo: Normal EF>55%, Gr 1DD, mild aortic sclerosis; Evaluated with CPET - peak VO2 97%; Chronotropic Incompetence (submaximal effort)  . Chronotropic incompetence with sinus node dysfunction [facility]) October 2013    On CPET test; beta blockers reduced  . PAF (paroxysmal atrial fibrillation) ([place]) 10-11/ 2014    CardioNet Event Monitor: NSR & S Brady -- Rates 50-100; Total A. fib burden 35 hours and 27 minutes. 1296 episodes, longest was 1 hour 29 minutes. Rate ranged from 52-169 beats per minute.  . Tachycardia-bradycardia syndrome ([place]) 11/2012  . History of syncope     Per EP - neurocardiogenic & not Bradycardia related (no PPM)  . OSA (obstructive sleep apnea)     hx bladder infections  . Diabetes mellitus type 2, controlled ([place])     On oral medications  . Essential hypertension     Allowing for permissive hypertension to avoid orthostatic hypotension  . Hyperlipidemia with target LDL less than 70     HDL at goal, LDL not at goal, borderline triglycerides. On Crestor 20 mg  . Chronic kidney disease (CKD), stage II (mild)     Related to current bladder infections (although diabetes cannot be excluded)  . Spinal stenosis of lumbar region 11/2011  . Osteoarthritis   . GERD (gastroesophageal reflux disease)     On PPI  . Seasonal allergies   .  Anxiety     When necessary Xanax  . Glaucoma   . Bilateral cataracts     Status post stroke or correction   Past Surgical History  Procedure Laterality Date  . Cataracts      both eyes  . Colonoscopy    . Vaginal hysterectomy    . Breast biopsy      both breast  . Cardiac catheterization  07/05/2011    Unstable Angina --> Myoview wiht Inf-Lat Ischemia.  Proximal OM1 lesion --> PCI; 100% mid RCA with right to right and left  to right bridging collaterals from circumflex RPL and LAD the PDA.  Marland Kitchen Coronary angioplasty with stent placement  07/05/2011    Promus Element DES 2.5 mm x 16 mm-post dilated to 2.65 mm CX-Proximal OM1  . Nm myoview ltd  October 2014     Low risk. Fixed basal inferior artifact normal EF. No ischemia --> (as compared to pre-PCI Myoview revealing inferolateral ischemia)  . Transthoracic echocardiogram  October 2013    Normal EF, >55%. No regional WMA, Grd 1 DD,  mildly sclerotic aortic valve without stenosis  . Left heart catheterization with coronary angiogram N/A 07/06/2011    Procedure: LEFT HEART CATHETERIZATION WITH CORONARY ANGIOGRAM;  Surgeon: Leonie Man, MD;  Location: [facility] CATH LAB;  Service: Cardiovascular;  Laterality: N/A;  . Knee arthroscopy with medial menisectomy Right 06/24/2014    Procedure: RIGHT KNEE ARTHROSCOPY WITH partial lateral MENISECTOMY, abrasion chondroplasty of medial femoral condyle and patella, microfracture technique;  Surgeon: Latanya Maudlin, MD;  Location: [facility] ORS;  Service: Orthopedics;  Laterality: Right;   Social History:   reports that she has never smoked. She has never used smokeless tobacco. She reports that she does not drink alcohol or use illicit drugs.  Family History  Problem Relation Age of Onset  . Colon cancer Neg Hx   . Esophageal cancer Neg Hx   . Stomach cancer Neg Hx   . COPD Mother   . Heart attack Father   . Heart disease Father   . Cancer Sister   . Cancer Sister   . Pulmonary fibrosis Mother   . Clotting disorder Sister   . Heart attack Sister   . Stroke Father     Medications: Patient's Medications  New Prescriptions   No medications on file  Previous Medications   ACETAMINOPHEN (TYLENOL) 650 MG CR TABLET    Take 1,300 mg by mouth 2 (two) times daily as needed for pain.   ALPRAZOLAM (XANAX) 0.25 MG TABLET    Take 1 tablet (0.25 mg total) by mouth at bedtime as needed for sleep or anxiety.   AZELASTINE HCL NA    Place 1 spray  into both nostrils at bedtime as needed (allergies/cough).   BIMATOPROST (LUMIGAN) 0.01 % SOLN    Place 1 drop into both eyes at bedtime.   CHOLECALCIFEROL (VITAMIN D) 1000 UNITS TABLET    Take 1,000 Units by mouth daily.   DOCUSATE SODIUM (COLACE) 100 MG CAPSULE    Take 100 mg by mouth daily as needed (constipation).    FEXOFENADINE (ALLEGRA) 180 MG TABLET    Take 180 mg by mouth at bedtime.    FISH OIL-OMEGA-3 FATTY ACIDS 1000 MG CAPSULE    Take 1 g by mouth daily.    INSULIN GLARGINE (LANTUS) 100 UNIT/ML SOPN    Inject 36 Units into the skin at bedtime.    INSULIN LISPRO (HUMALOG KWIKPEN) 100 UNIT/ML KIWKPEN    Inject 1 Units  into the skin 3 (three) times daily as needed (CBG >200).   IPRATROPIUM (ATROVENT) 0.06 % NASAL SPRAY    Place 1 spray into the nose daily as needed (allergies/ congestion/cough).    LEVOCETIRIZINE (XYZAL) 5 MG TABLET    Take 5 mg by mouth at bedtime.   MAGNESIUM 500 MG CAPS    Take 500 mg by mouth daily as needed (constipation).    METFORMIN (GLUCOPHAGE) 1000 MG TABLET    Take 1,000 mg by mouth 2 (two) times daily with a meal.   METOPROLOL TARTRATE (LOPRESSOR) 25 MG TABLET    Take 0.5 tablets (12.5 mg total) by mouth 2 (two) times daily.   MIRABEGRON ER (MYRBETRIQ) 50 MG TB24 TABLET    Take 50 mg by mouth at bedtime.    NITROGLYCERIN (NITROSTAT) 0.4 MG SL TABLET    Place 0.4 mg under the tongue every 5 (five) minutes as needed for chest pain.   NYSTATIN CREAM (MYCOSTATIN)    Apply 1 application topically daily.   OMEPRAZOLE (PRILOSEC) 20 MG CAPSULE    Take 20 mg by mouth daily.   PAROXETINE (PAXIL) 10 MG TABLET    Take 10 mg by mouth daily.    RIVAROXABAN (XARELTO) 15 MG TABS TABLET    Take 1 tablet (15 mg total) by mouth daily with supper.   ROPINIROLE (REQUIP) 0.5 MG TABLET    TAKE 1 TABLET AFTER DINNER EACH NIGHT.   ROSUVASTATIN (CRESTOR) 40 MG TABLET    Take 1 tablet (40 mg total) by mouth at bedtime.   SITAGLIPTIN (JANUVIA) 50 MG TABLET    Take 50 mg by mouth at  bedtime.   Modified Medications   No medications on file  Discontinued Medications   No medications on file     Physical Exam: Filed Vitals:   12/30/14 1523  BP: 136/67  Pulse: 66  Temp: 98 F (36.7 C)  Resp: 18    Physical Exam  Constitutional: She is oriented to person, place, and time. She appears well-developed and well-nourished. No distress.  HENT:  Head: Normocephalic and atraumatic.  Mouth/Throat: Oropharynx is clear and moist. No oropharyngeal exudate.  Eyes: Conjunctivae are normal. Pupils are equal, round, and reactive to light.  Neck: Normal range of motion. Neck supple.  Cardiovascular: Normal rate and normal heart sounds.  An irregular rhythm present.  Pulmonary/Chest: Effort normal and breath sounds normal.  Abdominal: Soft. Bowel sounds are normal.  Musculoskeletal: She exhibits no edema or tenderness.  Neurological: She is alert and oriented to person, place, and time.  Right sided weakness, greater in lower extremities vs upper.  right facial droop dysarthria noted  Skin: Skin is warm and dry. She is not diaphoretic.  Psychiatric: She has a normal mood and affect.    Labs reviewed: Basic Metabolic Panel:  Recent Labs  11/05/14 1654 12/26/14 1430 12/26/14 1441 12/26/14 1953  NA 134* 137 142 142  K 4.6 4.1 4.1 4.3  CL 100* 100* 101 104  CO2 22 26  --  23  GLUCOSE 260* 169* 168* 100*  BUN 23* 18 21* 20  CREATININE 1.18* 0.87 0.90 0.98  CALCIUM 10.7* 9.7  --  9.9   Liver Function Tests:  Recent Labs  11/05/14 1654 12/26/14 1430 12/26/14 1953  AST 24 20 23   ALT 35 17 15  ALKPHOS 75 53 49  BILITOT 0.1* 0.2* 0.4  PROT 6.6 6.9 6.8  ALBUMIN 3.2* 3.7 3.5    Recent Labs  11/05/14 1654  LIPASE 50   No results for input(s): AMMONIA in the last 8760 hours. CBC:  Recent Labs  11/05/14 1654 12/26/14 1430 12/26/14 1441 12/26/14 1953 12/28/14 0529  WBC 8.5 6.5  --  8.3 8.3  NEUTROABS 6.3 4.6  --  5.5  --   HGB 11.5* 12.3 13.9 12.1  11.9*  HCT 34.6* 37.1 41.0 37.5 35.7*  MCV 90.3 93.2  --  94.0 92.7  PLT 287 277  --  270 267   TSH: No results for input(s): TSH in the last 8760 hours. A1C: Lab Results  Component Value Date   HGBA1C 8.4* 12/27/2014   Lipid Panel:  Recent Labs  12/27/14 0735  CHOL 163  HDL 39*  LDLCALC 98  TRIG 131  CHOLHDL 4.2    Radiological Exams: Ct Head Wo Contrast  12/26/2014  CLINICAL DATA:  Numbness and weakness of the right hand and leg. Slurred speech. The onset of symptoms was 4 days ago. EXAM: CT HEAD WITHOUT CONTRAST TECHNIQUE: Contiguous axial images were obtained from the base of the skull through the vertex without intravenous contrast. COMPARISON:  None. FINDINGS: Brain: No evidence of acute cortical infarction, hemorrhage, extra-axial collection, ventriculomegaly, or mass effect. There is moderate brain parenchymal atrophy and chronic small vessel disease changes. More focal area of hypoattenuation is seen in the left supratentorial subcortical white matter, underneath the central sulcus. Vascular: No hyperdense vessel or unexpected calcification. Skull: Negative for fracture or focal lesion. Sinuses/Orbits: No acute findings. Other: Pinial gland calcifications are seen. IMPRESSION: Area of hypoattenuation in the subcortical white matter of the left supratentorial brain. Acute infarction cannot be excluded. Further evaluation with MRI of the brain may be considered. Chronic brain parenchymal atrophy and deep white matter microvascular disease. These results were called by telephone at the time of interpretation on 12/26/2014 at 4:36 pm to Ms. Chrisco, RN from [facility] emergency department, who verbally acknowledged these results. Electronically Signed   By: Fidela Salisbury M.D.   On: 12/26/2014 16:40   Mr Brain Wo Contrast  12/26/2014  CLINICAL DATA:  Speech difficulty and RIGHT hemiparesis. Symptoms began 4 days ago. Patient is anticoagulated. EXAM: MRI HEAD WITHOUT CONTRAST  MRA HEAD WITHOUT CONTRAST TECHNIQUE: Multiplanar, multiecho pulse sequences of the brain and surrounding structures were obtained without intravenous contrast. Angiographic images of the head were obtained using MRA technique without contrast. COMPARISON:  CT head earlier today. FINDINGS: MRI HEAD FINDINGS As suspected from CT, there is an ovoid 10 x 15 mm area of restricted diffusion representing acute infarction involving the LEFT frontal cortex and subcortical white matter. This is nonhemorrhagic. No other areas of acute infarction are seen. No mass lesion.  No extra-axial fluid. Global atrophy. Hydrocephalus ex vacuo. Extensive T2 and FLAIR hyperintensity throughout the white matter consistent with small vessel disease. Scattered areas of remote lacunar infarction. Flow is maintained in the internal carotid arteries, basilar artery, and both vertebral arteries. Unremarkable pituitary and cerebellar tonsils. No osseous findings. BILATERAL cataract extraction. No sinus or mastoid disease. Extracranial soft tissues unremarkable. MRA HEAD FINDINGS Minor non-stenotic irregularity of both internal carotid arteries. Basilar artery widely patent with vertebrals codominant, and widely patent. No significant proximal stenosis of the anterior, middle, or posterior cerebral arteries. No cerebellar branch occlusion. No intracranial aneurysm is evident. Moderately diseased RIGHT anterior cerebral artery in its distal A2 segment. Moderately diseased BILATERAL middle cerebral artery M3 branches, which on the LEFT could contribute to the observed pattern of infarction. IMPRESSION: Acute LEFT frontal cortical  and subcortical infarction, 10 x 15 mm, nonhemorrhagic. Severe atrophy with extensive small vessel disease. No proximal large vessel stenosis or occlusion. Intracranial atherosclerotic change involves the distal anterior and middle cerebral artery branches. Electronically Signed   By: Staci Righter M.D.   On: 12/26/2014  21:35   Mr Jodene Nam Head/brain Wo Cm  12/26/2014  CLINICAL DATA:  Speech difficulty and RIGHT hemiparesis. Symptoms began 4 days ago. Patient is anticoagulated. EXAM: MRI HEAD WITHOUT CONTRAST MRA HEAD WITHOUT CONTRAST TECHNIQUE: Multiplanar, multiecho pulse sequences of the brain and surrounding structures were obtained without intravenous contrast. Angiographic images of the head were obtained using MRA technique without contrast. COMPARISON:  CT head earlier today. FINDINGS: MRI HEAD FINDINGS As suspected from CT, there is an ovoid 10 x 15 mm area of restricted diffusion representing acute infarction involving the LEFT frontal cortex and subcortical white matter. This is nonhemorrhagic. No other areas of acute infarction are seen. No mass lesion.  No extra-axial fluid. Global atrophy. Hydrocephalus ex vacuo. Extensive T2 and FLAIR hyperintensity throughout the white matter consistent with small vessel disease. Scattered areas of remote lacunar infarction. Flow is maintained in the internal carotid arteries, basilar artery, and both vertebral arteries. Unremarkable pituitary and cerebellar tonsils. No osseous findings. BILATERAL cataract extraction. No sinus or mastoid disease. Extracranial soft tissues unremarkable. MRA HEAD FINDINGS Minor non-stenotic irregularity of both internal carotid arteries. Basilar artery widely patent with vertebrals codominant, and widely patent. No significant proximal stenosis of the anterior, middle, or posterior cerebral arteries. No cerebellar branch occlusion. No intracranial aneurysm is evident. Moderately diseased RIGHT anterior cerebral artery in its distal A2 segment. Moderately diseased BILATERAL middle cerebral artery M3 branches, which on the LEFT could contribute to the observed pattern of infarction. IMPRESSION: Acute LEFT frontal cortical and subcortical infarction, 10 x 15 mm, nonhemorrhagic. Severe atrophy with extensive small vessel disease. No proximal large vessel  stenosis or occlusion. Intracranial atherosclerotic change involves the distal anterior and middle cerebral artery branches. Electronically Signed   By: Staci Righter M.D.   On: 12/26/2014 21:35    Assessment/Plan 1. Cerebral infarction due to thrombosis of left carotid artery (HCC) L frontal cortical CVA 10 x 15 mm. Deficits mainly speech and mild R sided weakness. Pt at SNF for ongoing rehab. Cont on Xarelto 15 PO daily as had CVA on therapeutic INR level  2. Uncontrolled type 2 diabetes mellitus with complication, with long-term current use of insulin ([place]) -poorly controlled blood sugars, pt eating well and reports great appetite. Home medication includes 26 units of lantus with SSI. Will increase lantus to 38 units at this time and cont metformin and Sitagliptin   3. Paroxysmal atrial fibrillation (HCC) -pt with hx a fib with hx of Tachy-brady syndrome- had RVR in hospital but was converted, stable now. conts on xarelto  4. CAD in native artery Without chest pains, conts on lopressor and xarelto at this time.   5. Essential hypertension Stable, conts on lopressor 12.5 mg twice daily  6. CKD (chronic kidney disease), stage II Will monitor, follow up BMP  7. HLD (hyperlipidemia) crestor increased at recent admission, to cont crestor 40 mg daily   8. Constipation -will start Senna S by mouth daily      Cissy Galbreath K. Harle Battiest  [facility] [phone] 8 am - 5 pm) [phone] (after hours)

## 2014-12-31 ENCOUNTER — Non-Acute Institutional Stay (SKILLED_NURSING_FACILITY): Payer: Medicare Other | Admitting: Internal Medicine

## 2014-12-31 DIAGNOSIS — E118 Type 2 diabetes mellitus with unspecified complications: Secondary | ICD-10-CM

## 2014-12-31 DIAGNOSIS — M62838 Other muscle spasm: Secondary | ICD-10-CM

## 2014-12-31 DIAGNOSIS — I10 Essential (primary) hypertension: Secondary | ICD-10-CM

## 2014-12-31 DIAGNOSIS — IMO0002 Reserved for concepts with insufficient information to code with codable children: Secondary | ICD-10-CM

## 2014-12-31 DIAGNOSIS — I639 Cerebral infarction, unspecified: Secondary | ICD-10-CM | POA: Diagnosis not present

## 2014-12-31 DIAGNOSIS — R531 Weakness: Secondary | ICD-10-CM

## 2014-12-31 DIAGNOSIS — F411 Generalized anxiety disorder: Secondary | ICD-10-CM

## 2014-12-31 DIAGNOSIS — K219 Gastro-esophageal reflux disease without esophagitis: Secondary | ICD-10-CM | POA: Diagnosis not present

## 2014-12-31 DIAGNOSIS — E1165 Type 2 diabetes mellitus with hyperglycemia: Secondary | ICD-10-CM

## 2014-12-31 DIAGNOSIS — N3281 Overactive bladder: Secondary | ICD-10-CM

## 2014-12-31 DIAGNOSIS — M6289 Other specified disorders of muscle: Secondary | ICD-10-CM

## 2014-12-31 DIAGNOSIS — G2581 Restless legs syndrome: Secondary | ICD-10-CM | POA: Diagnosis not present

## 2014-12-31 DIAGNOSIS — I480 Paroxysmal atrial fibrillation: Secondary | ICD-10-CM

## 2014-12-31 DIAGNOSIS — R471 Dysarthria and anarthria: Secondary | ICD-10-CM

## 2014-12-31 DIAGNOSIS — Z794 Long term (current) use of insulin: Secondary | ICD-10-CM

## 2014-12-31 DIAGNOSIS — K5901 Slow transit constipation: Secondary | ICD-10-CM

## 2014-12-31 DIAGNOSIS — R5381 Other malaise: Secondary | ICD-10-CM

## 2014-12-31 NOTE — Progress Notes (Signed)
Patient ID: Abigail Boyd, female   DOB: 1929/12/21, 81 y.o.   MRN: [id number]     Facility: [facility]    PCP: Morton Peters, MD  Code Status: full code  Allergies  Allergen Reactions  . Lisinopril Cough  . Losartan Cough  . Sulfa Antibiotics Hives    Chief Complaint  Patient presents with  . New Admit To SNF     HPI:  81 y.o. patient is here for short term rehabilitation post hospital admission from 12/26/14-12/29/14 with acute left frontal CVA with dysarthria and right sided weakness. She was seen by neurology. Echocardiogram showed grade 1 diastolic dysfunction. Carotid ultrasound showed no significant stenosis. She was started on xarelto. She also had afib with RVR and required cardizem drip. Her statin dosing was increased. She has PMH of DM, GERD, HTN, HLD, RLS among others. She is seen in her room today. She is alert and oriented. She complaints of being tired and constipated but denies any other concerns.   Review of Systems:  Constitutional: Negative for fever, chills, diaphoresis.  HENT: Negative for headache, congestion, nasal discharge Eyes: Negative for eye pain, blurred vision, double vision and discharge.  Respiratory: Negative for cough, shortness of breath and wheezing.   Cardiovascular: Negative for chest pain, palpitations, leg swelling.  Gastrointestinal: Negative for heartburn, nausea, vomiting, abdominal pain. Had a small bowel movement yesterday Genitourinary: Negative for dysuria Musculoskeletal: Negative for back pain, falls in the facility Skin: Negative for itching, rash.  Neurological: Negative for dizziness, tingling Psychiatric/Behavioral: positive for history of depression    Past Medical History  Diagnosis Date  . History of unstable angina 06/13/2011    Jaw pain awakening from sleep -- Myoview --CATH --> PCI  . Abnormal nuclear stress test 06/21/2011    Inferolateral reversible defect;--> cardiac cath &  PCI of CxOM, occluded RCA with collaterals;; followup Myoview 11/2012: Low risk. Fixed basal inferior artifact normal EF. No ischemia  . CAD S/P percutaneous coronary angioplasty     s/P PCI to proximal OM1 w/ DES; occluded RCA with bridging and L-R collaterals (medical management)  . Shortness of breath on exertion October 2013    2-D echo: Normal EF>55%, Gr 1DD, mild aortic sclerosis; Evaluated with CPET - peak VO2 97%; Chronotropic Incompetence (submaximal effort)  . Chronotropic incompetence with sinus node dysfunction [facility]) October 2013    On CPET test; beta blockers reduced  . PAF (paroxysmal atrial fibrillation) ([place]) 10-11/ 2014    CardioNet Event Monitor: NSR & S Brady -- Rates 50-100; Total A. fib burden 35 hours and 27 minutes. 1296 episodes, longest was 1 hour 29 minutes. Rate ranged from 52-169 beats per minute.  . Tachycardia-bradycardia syndrome ([place]) 11/2012  . History of syncope     Per EP - neurocardiogenic & not Bradycardia related (no PPM)  . OSA (obstructive sleep apnea)     hx bladder infections  . Diabetes mellitus type 2, controlled ([place])     On oral medications  . Essential hypertension     Allowing for permissive hypertension to avoid orthostatic hypotension  . Hyperlipidemia with target LDL less than 70     HDL at goal, LDL not at goal, borderline triglycerides. On Crestor 20 mg  . Chronic kidney disease (CKD), stage II (mild)     Related to current bladder infections (although diabetes cannot be excluded)  . Spinal stenosis of lumbar region 11/2011  . Osteoarthritis   . GERD (gastroesophageal reflux disease)  On PPI  . Seasonal allergies   . Anxiety     When necessary Xanax  . Glaucoma   . Bilateral cataracts     Status post stroke or correction   Past Surgical History  Procedure Laterality Date  . Cataracts      both eyes  . Colonoscopy    . Vaginal hysterectomy    . Breast biopsy      both breast  . Cardiac catheterization  07/05/2011     Unstable Angina --> Myoview wiht Inf-Lat Ischemia.  Proximal OM1 lesion --> PCI; 100% mid RCA with right to right and left to right bridging collaterals from circumflex RPL and LAD the PDA.  Marland Kitchen Coronary angioplasty with stent placement  07/05/2011    Promus Element DES 2.5 mm x 16 mm-post dilated to 2.65 mm CX-Proximal OM1  . Nm myoview ltd  October 2014     Low risk. Fixed basal inferior artifact normal EF. No ischemia --> (as compared to pre-PCI Myoview revealing inferolateral ischemia)  . Transthoracic echocardiogram  October 2013    Normal EF, >55%. No regional WMA, Grd 1 DD,  mildly sclerotic aortic valve without stenosis  . Left heart catheterization with coronary angiogram N/A 07/06/2011    Procedure: LEFT HEART CATHETERIZATION WITH CORONARY ANGIOGRAM;  Surgeon: Leonie Man, MD;  Location: [facility] CATH LAB;  Service: Cardiovascular;  Laterality: N/A;  . Knee arthroscopy with medial menisectomy Right 06/24/2014    Procedure: RIGHT KNEE ARTHROSCOPY WITH partial lateral MENISECTOMY, abrasion chondroplasty of medial femoral condyle and patella, microfracture technique;  Surgeon: Latanya Maudlin, MD;  Location: [facility] ORS;  Service: Orthopedics;  Laterality: Right;   Social History:   reports that she has never smoked. She has never used smokeless tobacco. She reports that she does not drink alcohol or use illicit drugs.  Family History  Problem Relation Age of Onset  . Colon cancer Neg Hx   . Esophageal cancer Neg Hx   . Stomach cancer Neg Hx   . COPD Mother   . Heart attack Father   . Heart disease Father   . Cancer Sister   . Cancer Sister   . Pulmonary fibrosis Mother   . Clotting disorder Sister   . Heart attack Sister   . Stroke Father     Medications:   Medication List       This list is accurate as of: 12/31/14  1:18 PM.  Always use your most recent med list.               acetaminophen 650 MG CR tablet  Commonly known as:  TYLENOL  Take 1,300 mg by mouth 2 (two) times daily  as needed for pain.     ALPRAZolam 0.25 MG tablet  Commonly known as:  XANAX  Take 1 tablet (0.25 mg total) by mouth at bedtime as needed for sleep or anxiety.     AZELASTINE HCL NA  Place 1 spray into both nostrils at bedtime as needed (allergies/cough).     cholecalciferol 1000 UNITS tablet  Commonly known as:  VITAMIN D  Take 1,000 Units by mouth daily.     docusate sodium 100 MG capsule  Commonly known as:  COLACE  Take 100 mg by mouth daily as needed (constipation).     fexofenadine 180 MG tablet  Commonly known as:  ALLEGRA  Take 180 mg by mouth at bedtime.     fish oil-omega-3 fatty acids 1000 MG capsule  Take 1 g by  mouth daily.     HUMALOG KWIKPEN 100 UNIT/ML KiwkPen  Generic drug:  insulin lispro  Inject 1 Units into the skin 3 (three) times daily as needed (CBG >200).     insulin glargine 100 unit/mL Sopn  Commonly known as:  LANTUS  Inject 36 Units into the skin at bedtime.     ipratropium 0.06 % nasal spray  Commonly known as:  ATROVENT  Place 1 spray into the nose daily as needed (allergies/ congestion/cough).     levocetirizine 5 MG tablet  Commonly known as:  XYZAL  Take 5 mg by mouth at bedtime.     LUMIGAN 0.01 % Soln  Generic drug:  bimatoprost  Place 1 drop into both eyes at bedtime.     Magnesium 500 MG Caps  Take 500 mg by mouth daily as needed (constipation).     metFORMIN 1000 MG tablet  Commonly known as:  GLUCOPHAGE  Take 1,000 mg by mouth 2 (two) times daily with a meal.     metoprolol tartrate 25 MG tablet  Commonly known as:  LOPRESSOR  Take 0.5 tablets (12.5 mg total) by mouth 2 (two) times daily.     MYRBETRIQ 50 MG Tb24 tablet  Generic drug:  mirabegron ER  Take 50 mg by mouth at bedtime.     nitroGLYCERIN 0.4 MG SL tablet  Commonly known as:  NITROSTAT  Place 0.4 mg under the tongue every 5 (five) minutes as needed for chest pain.     nystatin cream  Commonly known as:  MYCOSTATIN  Apply 1 application topically daily.       omeprazole 20 MG capsule  Commonly known as:  PRILOSEC  Take 20 mg by mouth daily.     PARoxetine 10 MG tablet  Commonly known as:  PAXIL  Take 10 mg by mouth daily.     Rivaroxaban 15 MG Tabs tablet  Commonly known as:  XARELTO  Take 1 tablet (15 mg total) by mouth daily with supper.     rOPINIRole 0.5 MG tablet  Commonly known as:  REQUIP  TAKE 1 TABLET AFTER DINNER EACH NIGHT.     rosuvastatin 40 MG tablet  Commonly known as:  CRESTOR  Take 1 tablet (40 mg total) by mouth at bedtime.     senna-docusate 8.6-50 MG tablet  Commonly known as:  Senokot-S  Take 1 tablet by mouth daily.     sitaGLIPtin 50 MG tablet  Commonly known as:  JANUVIA  Take 50 mg by mouth at bedtime.         Physical Exam: Filed Vitals:   12/31/14 1317  BP: 150/65  Pulse: 70  Temp: 97.3 F (36.3 C)  Resp: 19  SpO2: 99%    General- elderly female, well built, in no acute distress Head- normocephalic, atraumatic Nose- normal nasal mucosa, no maxillary or frontal sinus tenderness, no nasal discharge Throat- moist mucus membrane Eyes- PERRLA, EOMI, no pallor, no icterus, no discharge, normal conjunctiva, normal sclera Neck- no cervical lymphadenopathy, no thyromegaly, no jugular vein distension Cardiovascular- irregular heart rate, trace leg edema Respiratory- bilateral clear to auscultation, no wheeze, no rhonchi, no crackles, no use of accessory muscles Abdomen- bowel sounds present, soft, non tender Musculoskeletal- right sided weakness with RLE>RUE. able to move her upper extremities, on a recliner chair, RLE spasticity noted Neurological- right facial droop, some dysarthria present, alert and oriented to person, place and time, has some word finding difficulties Skin- warm and dry, erythema in groin and buttock  area Psychiatry- normal mood and affect    Labs reviewed: Basic Metabolic Panel:  Recent Labs  11/05/14 1654 12/26/14 1430 12/26/14 1441 12/26/14 1953  NA 134*  137 142 142  K 4.6 4.1 4.1 4.3  CL 100* 100* 101 104  CO2 22 26  --  23  GLUCOSE 260* 169* 168* 100*  BUN 23* 18 21* 20  CREATININE 1.18* 0.87 0.90 0.98  CALCIUM 10.7* 9.7  --  9.9   Liver Function Tests:  Recent Labs  11/05/14 1654 12/26/14 1430 12/26/14 1953  AST 24 20 23   ALT 35 17 15  ALKPHOS 75 53 49  BILITOT 0.1* 0.2* 0.4  PROT 6.6 6.9 6.8  ALBUMIN 3.2* 3.7 3.5    Recent Labs  11/05/14 1654  LIPASE 50   No results for input(s): AMMONIA in the last 8760 hours. CBC:  Recent Labs  11/05/14 1654 12/26/14 1430 12/26/14 1441 12/26/14 1953 12/28/14 0529  WBC 8.5 6.5  --  8.3 8.3  NEUTROABS 6.3 4.6  --  5.5  --   HGB 11.5* 12.3 13.9 12.1 11.9*  HCT 34.6* 37.1 41.0 37.5 35.7*  MCV 90.3 93.2  --  94.0 92.7  PLT 287 277  --  270 267   Cardiac Enzymes:  Recent Labs  11/05/14 1706 11/05/14 2229 12/26/14 1953  TROPONINI <0.03 <0.03 <0.03   BNP: Invalid input(s): POCBNP CBG:  Recent Labs  12/28/14 2201 12/29/14 0733 12/29/14 1219  GLUCAP 212* 197* 278*    Radiological Exams: Ct Head Wo Contrast  12/26/2014  CLINICAL DATA:  Numbness and weakness of the right hand and leg. Slurred speech. The onset of symptoms was 4 days ago. EXAM: CT HEAD WITHOUT CONTRAST TECHNIQUE: Contiguous axial images were obtained from the base of the skull through the vertex without intravenous contrast. COMPARISON:  None. FINDINGS: Brain: No evidence of acute cortical infarction, hemorrhage, extra-axial collection, ventriculomegaly, or mass effect. There is moderate brain parenchymal atrophy and chronic small vessel disease changes. More focal area of hypoattenuation is seen in the left supratentorial subcortical white matter, underneath the central sulcus. Vascular: No hyperdense vessel or unexpected calcification. Skull: Negative for fracture or focal lesion. Sinuses/Orbits: No acute findings. Other: Pinial gland calcifications are seen. IMPRESSION: Area of hypoattenuation in the  subcortical white matter of the left supratentorial brain. Acute infarction cannot be excluded. Further evaluation with MRI of the brain may be considered. Chronic brain parenchymal atrophy and deep white matter microvascular disease. These results were called by telephone at the time of interpretation on 12/26/2014 at 4:36 pm to Ms. Chrisco, RN from [facility] emergency department, who verbally acknowledged these results. Electronically Signed   By: Fidela Salisbury M.D.   On: 12/26/2014 16:40   Mr Brain Wo Contrast  12/26/2014  CLINICAL DATA:  Speech difficulty and RIGHT hemiparesis. Symptoms began 4 days ago. Patient is anticoagulated. EXAM: MRI HEAD WITHOUT CONTRAST MRA HEAD WITHOUT CONTRAST TECHNIQUE: Multiplanar, multiecho pulse sequences of the brain and surrounding structures were obtained without intravenous contrast. Angiographic images of the head were obtained using MRA technique without contrast. COMPARISON:  CT head earlier today. FINDINGS: MRI HEAD FINDINGS As suspected from CT, there is an ovoid 10 x 15 mm area of restricted diffusion representing acute infarction involving the LEFT frontal cortex and subcortical white matter. This is nonhemorrhagic. No other areas of acute infarction are seen. No mass lesion.  No extra-axial fluid. Global atrophy. Hydrocephalus ex vacuo. Extensive T2 and FLAIR hyperintensity throughout the white matter  consistent with small vessel disease. Scattered areas of remote lacunar infarction. Flow is maintained in the internal carotid arteries, basilar artery, and both vertebral arteries. Unremarkable pituitary and cerebellar tonsils. No osseous findings. BILATERAL cataract extraction. No sinus or mastoid disease. Extracranial soft tissues unremarkable. MRA HEAD FINDINGS Minor non-stenotic irregularity of both internal carotid arteries. Basilar artery widely patent with vertebrals codominant, and widely patent. No significant proximal stenosis of the anterior,  middle, or posterior cerebral arteries. No cerebellar branch occlusion. No intracranial aneurysm is evident. Moderately diseased RIGHT anterior cerebral artery in its distal A2 segment. Moderately diseased BILATERAL middle cerebral artery M3 branches, which on the LEFT could contribute to the observed pattern of infarction. IMPRESSION: Acute LEFT frontal cortical and subcortical infarction, 10 x 15 mm, nonhemorrhagic. Severe atrophy with extensive small vessel disease. No proximal large vessel stenosis or occlusion. Intracranial atherosclerotic change involves the distal anterior and middle cerebral artery branches. Electronically Signed   By: Staci Righter M.D.   On: 12/26/2014 21:35   Mr Jodene Nam Head/brain Wo Cm  12/26/2014  CLINICAL DATA:  Speech difficulty and RIGHT hemiparesis. Symptoms began 4 days ago. Patient is anticoagulated. EXAM: MRI HEAD WITHOUT CONTRAST MRA HEAD WITHOUT CONTRAST TECHNIQUE: Multiplanar, multiecho pulse sequences of the brain and surrounding structures were obtained without intravenous contrast. Angiographic images of the head were obtained using MRA technique without contrast. COMPARISON:  CT head earlier today. FINDINGS: MRI HEAD FINDINGS As suspected from CT, there is an ovoid 10 x 15 mm area of restricted diffusion representing acute infarction involving the LEFT frontal cortex and subcortical white matter. This is nonhemorrhagic. No other areas of acute infarction are seen. No mass lesion.  No extra-axial fluid. Global atrophy. Hydrocephalus ex vacuo. Extensive T2 and FLAIR hyperintensity throughout the white matter consistent with small vessel disease. Scattered areas of remote lacunar infarction. Flow is maintained in the internal carotid arteries, basilar artery, and both vertebral arteries. Unremarkable pituitary and cerebellar tonsils. No osseous findings. BILATERAL cataract extraction. No sinus or mastoid disease. Extracranial soft tissues unremarkable. MRA HEAD FINDINGS Minor  non-stenotic irregularity of both internal carotid arteries. Basilar artery widely patent with vertebrals codominant, and widely patent. No significant proximal stenosis of the anterior, middle, or posterior cerebral arteries. No cerebellar branch occlusion. No intracranial aneurysm is evident. Moderately diseased RIGHT anterior cerebral artery in its distal A2 segment. Moderately diseased BILATERAL middle cerebral artery M3 branches, which on the LEFT could contribute to the observed pattern of infarction. IMPRESSION: Acute LEFT frontal cortical and subcortical infarction, 10 x 15 mm, nonhemorrhagic. Severe atrophy with extensive small vessel disease. No proximal large vessel stenosis or occlusion. Intracranial atherosclerotic change involves the distal anterior and middle cerebral artery branches. Electronically Signed   By: Staci Righter M.D.   On: 12/26/2014 21:35    Assessment/Plan  Physical deconditioning Post acute CVA with right sided weakness. Will have patient work with PT/OT as tolerated to regain strength and restore function.  Fall precautions are in place.  Acute left frontal infarct Continue xarelto 15 mg daily, crestor 40 mg daily. Monitor bp reading. Will have her work with physical therapy and occupational therapy team to help with gait training and muscle strengthening exercises.fall precautions. Skin care. Encourage to be out of bed. To work with SLP team for dysarthria  Right sided weakness Post CVA with RLE> RUE. Will have patient work with PT/OT as tolerated to regain strength and restore function.  Fall precautions are in place.  Muscle spasm With her acute CVA.  Add baclofen 5 mg po daily for now to help with RLE spasticity and reassess  Dysarthria  Post cva, to work with SLP team. Aspiration precautions  Constipation Currently on colace prn and senna s daily. D/c colace. Change senna s to 2 tab qhs and add miralax daily and reassess. Encourage hydration  afib Rate  contorlled. Continue metoprolol tartrate 12.5 mg bid for now  HTN Elevated SBP. Continue toprol xl 12.5 mg bid for now and check bp q shift x 5 days and then daily to monitor bp reading  DM type 2 Lab Results  Component Value Date   HGBA1C 8.4* 12/27/2014  monitor cbg, continue lantus 36 u daily with metformin 1000 mg bid and januvia 50 mg daily and SSI humalog. Continue statin  gerd Stable on current regimen of PPI  RLS Continue requip 0.5 mg qhs  OAB Continue myrbetriq fofr now  Anxiety Stable, continue xanax 0.25 mg qhs prn only  Goals of care: short term rehabilitation   Labs/tests ordered: cbc, cmp  Family/ staff Communication: reviewed care plan with patient and nursing supervisor    Blanchie Serve, MD  [facility] [phone] (Monday-Friday 8 am - 5 pm) [phone] (afterhours)

## 2015-01-04 ENCOUNTER — Telehealth: Payer: Self-pay | Admitting: Cardiology

## 2015-01-04 LAB — CBC AND DIFFERENTIAL
HCT: 33 % — AB (ref 36–46)
Hemoglobin: 10.6 g/dL — AB (ref 12.0–16.0)
PLATELETS: 244 10*3/uL (ref 150–399)
WBC: 6.1 10*3/mL

## 2015-01-04 LAB — BASIC METABOLIC PANEL
BUN: 29 mg/dL — AB (ref 4–21)
CREATININE: 1.3 mg/dL — AB (ref 0.5–1.1)
GLUCOSE: 256 mg/dL
POTASSIUM: 4.6 mmol/L (ref 3.4–5.3)
Sodium: 137 mmol/L (ref 137–147)

## 2015-01-04 NOTE — Telephone Encounter (Signed)
Patient was in [facility] for CVA and Coumadin was switched to Xarelto.  Please let daughter know if she still needs Coumadin check on 11/09 this Wednesday.   Patient is at [facility].    I can call her back to let her know.

## 2015-01-05 NOTE — Telephone Encounter (Signed)
S/w pt daughter who states pt was switched from coumadin to xarelto during 10/29 hospitalization at [facility]. Pt has coumadin check 11/9 and daughter asking if pt still needs to come. Advised to keep coumadin appointment to be sure pt at therapeutic level. Daughter states pt in SNF and has appt in [place] today. Would like to know if she can have coumadin checked today while in [place]. Provided [place] number to daughter and she states she will call. If unable to be seen today, she will bring pt here tomorrow as scheduled.

## 2015-01-06 ENCOUNTER — Non-Acute Institutional Stay (SKILLED_NURSING_FACILITY): Payer: Medicare Other | Admitting: Internal Medicine

## 2015-01-06 ENCOUNTER — Encounter: Payer: Self-pay | Admitting: Internal Medicine

## 2015-01-06 DIAGNOSIS — I10 Essential (primary) hypertension: Secondary | ICD-10-CM

## 2015-01-06 DIAGNOSIS — R710 Precipitous drop in hematocrit: Secondary | ICD-10-CM

## 2015-01-06 DIAGNOSIS — J302 Other seasonal allergic rhinitis: Secondary | ICD-10-CM | POA: Diagnosis not present

## 2015-01-06 DIAGNOSIS — N289 Disorder of kidney and ureter, unspecified: Secondary | ICD-10-CM | POA: Diagnosis not present

## 2015-01-06 DIAGNOSIS — K5900 Constipation, unspecified: Secondary | ICD-10-CM | POA: Diagnosis not present

## 2015-01-06 NOTE — Progress Notes (Signed)
Patient ID: Abigail Boyd, female   DOB: 11-25-1929, 81 y.o.   MRN: [id number]       [facility]   PCP: Morton Peters, MD  Code Status: Full Code  Allergies  Allergen Reactions  . Lisinopril Cough  . Losartan Cough  . Sulfa Antibiotics Hives    Chief Complaint  Patient presents with  . Acute Visit    Decrease Hemaglobin and worsening kidney functions      HPI:  81 y.o. patient is seen with acute concerns. She had lab work showing drop in Hb and worsening of her creatinine. She complaints of feeling congested in her nose and having post nasal drip with cough and phlegm. Denies chest congestion and dyspnea for now. Has been having some loose stool with her being on laxative and would like that reduced. She is here for short term rehabilitation post hospital admission with acute left frontal CVA with dysarthria and right sided weakness. She has PMH of DM, GERD, HTN, HLD, RLS among others. Her daughter is on speaker phone this visit. She is concerned about her mother being on pain medication which is making her sleepy and would like for her to be only on tylenol for pain.  Review of Systems:  Constitutional: Negative for fever, chills, diaphoresis.  HENT: Negative for headache. Eyes: Negative for eye pain, blurred vision, double vision and discharge.  Respiratory: Negative for shortness of breath and wheezing.   Cardiovascular: Negative for chest pain, palpitations, leg swelling.  Gastrointestinal: Negative for heartburn, nausea, vomiting, abdominal pain. Had a bowel movement yesterday Genitourinary: Negative for dysuria Musculoskeletal: Negative for back pain, had a near fall this am while trying to come out of her recliner Skin: Negative for itching, rash.  Neurological: Negative for dizziness, tingling Psychiatric/Behavioral: positive for history of depression     Past Medical History  Diagnosis Date  . History of unstable angina 06/13/2011    Jaw pain  awakening from sleep -- Myoview --CATH --> PCI  . Abnormal nuclear stress test 06/21/2011    Inferolateral reversible defect;--> cardiac cath & PCI of CxOM, occluded RCA with collaterals;; followup Myoview 11/2012: Low risk. Fixed basal inferior artifact normal EF. No ischemia  . CAD S/P percutaneous coronary angioplasty     s/P PCI to proximal OM1 w/ DES; occluded RCA with bridging and L-R collaterals (medical management)  . Shortness of breath on exertion October 2013    2-D echo: Normal EF>55%, Gr 1DD, mild aortic sclerosis; Evaluated with CPET - peak VO2 97%; Chronotropic Incompetence (submaximal effort)  . Chronotropic incompetence with sinus node dysfunction [facility]) October 2013    On CPET test; beta blockers reduced  . PAF (paroxysmal atrial fibrillation) ([place]) 10-11/ 2014    CardioNet Event Monitor: NSR & S Brady -- Rates 50-100; Total A. fib burden 35 hours and 27 minutes. 1296 episodes, longest was 1 hour 29 minutes. Rate ranged from 52-169 beats per minute.  . Tachycardia-bradycardia syndrome ([place]) 11/2012  . History of syncope     Per EP - neurocardiogenic & not Bradycardia related (no PPM)  . OSA (obstructive sleep apnea)     hx bladder infections  . Diabetes mellitus type 2, controlled ([place])     On oral medications  . Essential hypertension     Allowing for permissive hypertension to avoid orthostatic hypotension  . Hyperlipidemia with target LDL less than 70     HDL at goal, LDL not at goal, borderline triglycerides. On Crestor 20  mg  . Chronic kidney disease (CKD), stage II (mild)     Related to current bladder infections (although diabetes cannot be excluded)  . Spinal stenosis of lumbar region 11/2011  . Osteoarthritis   . GERD (gastroesophageal reflux disease)     On PPI  . Seasonal allergies   . Anxiety     When necessary Xanax  . Glaucoma   . Bilateral cataracts     Status post stroke or correction     Medications:   Medication List       This list is  accurate as of: 01/06/15 11:37 AM.  Always use your most recent med list.               acetaminophen 650 MG CR tablet  Commonly known as:  TYLENOL  Take 1,300 mg by mouth 2 (two) times daily as needed for pain.     ALPRAZolam 0.25 MG tablet  Commonly known as:  XANAX  Take 1 tablet (0.25 mg total) by mouth at bedtime as needed for sleep or anxiety.     AZELASTINE HCL NA  Place 1 spray into both nostrils at bedtime as needed (allergies/cough).     BACLOFEN PO  Take 5mg  by mouth daily for muscle spasm     cholecalciferol 1000 UNITS tablet  Commonly known as:  VITAMIN D  Take 1,000 Units by mouth daily.     fexofenadine 180 MG tablet  Commonly known as:  ALLEGRA  Take 180 mg by mouth at bedtime.     fish oil-omega-3 fatty acids 1000 MG capsule  Take 1 g by mouth daily.     HUMALOG KWIKPEN 100 UNIT/ML KiwkPen  Generic drug:  insulin lispro  Inject 1 Units into the skin 3 (three) times daily as needed (CBG >200).     insulin glargine 100 unit/mL Sopn  Commonly known as:  LANTUS  Inject 36 Units into the skin at bedtime.     ipratropium 0.06 % nasal spray  Commonly known as:  ATROVENT  Place 1 spray into the nose daily as needed (allergies/ congestion/cough).     latanoprost 0.005 % ophthalmic solution  Commonly known as:  XALATAN  Instill one drop into each eye every night at bedtime     levocetirizine 5 MG tablet  Commonly known as:  XYZAL  Take 5 mg by mouth at bedtime.     Magnesium 500 MG Caps  Take 500 mg by mouth daily as needed (constipation).     metFORMIN 1000 MG tablet  Commonly known as:  GLUCOPHAGE  Take 1,000 mg by mouth 2 (two) times daily with a meal.     metoprolol tartrate 25 MG tablet  Commonly known as:  LOPRESSOR  Take 0.5 tablets (12.5 mg total) by mouth 2 (two) times daily.     MYRBETRIQ 50 MG Tb24 tablet  Generic drug:  mirabegron ER  Take one tablet by mouth once daily for kidneys     nitroGLYCERIN 0.4 MG SL tablet  Commonly known  as:  NITROSTAT  Place 0.4 mg under the tongue every 5 (five) minutes as needed for chest pain.     nystatin cream  Commonly known as:  MYCOSTATIN  Apply 1 application topically daily.     omeprazole 20 MG capsule  Commonly known as:  PRILOSEC  Take 20 mg by mouth daily.     PARoxetine 10 MG tablet  Commonly known as:  PAXIL  Take 10 mg by mouth daily.  polyethylene glycol packet  Commonly known as:  MIRALAX / GLYCOLAX  Take 17 g by mouth daily.     Rivaroxaban 15 MG Tabs tablet  Commonly known as:  XARELTO  Take 1 tablet (15 mg total) by mouth daily with supper.     rOPINIRole 0.5 MG tablet  Commonly known as:  REQUIP  TAKE 1 TABLET AFTER DINNER EACH NIGHT.     rosuvastatin 40 MG tablet  Commonly known as:  CRESTOR  Take 1 tablet (40 mg total) by mouth at bedtime.     senna-docusate 8.6-50 MG tablet  Commonly known as:  Senokot-S  Take two tablets by mouth at bedtime for constipation     sitaGLIPtin 50 MG tablet  Commonly known as:  JANUVIA  Take 50 mg by mouth at bedtime.         Physical Exam: Filed Vitals:   01/06/15 1117  BP: 148/82  Pulse: 72  Temp: 98.8 F (37.1 C)  TempSrc: Oral  Resp: 17  Height: 5\' 4"  (1.626 m)  Weight: 158 lb (71.668 kg)  SpO2: 97%    General- elderly female, well built, in no acute distress Head- normocephalic, atraumatic Nose- normal nasal mucosa, no maxillary or frontal sinus tenderness, clear nasal discharge Throat- moist mucus membrane Eyes- PERRLA, EOMI, no pallor, no icterus, no discharge, normal conjunctiva, normal sclera Neck- no cervical lymphadenopathy, no thyromegaly, no jugular vein distension Cardiovascular- irregular heart rate, trace leg edema Respiratory- bilateral clear to auscultation, no wheeze, no rhonchi, no crackles, no use of accessory muscles Abdomen- bowel sounds present, soft, non tender Musculoskeletal- right sided weakness with RLE>RUE. able to move her upper extremities, on a recliner  chair Neurological- mild right facial droop, some dysarthria present, alert and oriented to person, place and time, has some word finding difficulties Skin- warm and dry Psychiatry- normal mood and affect   Labs reviewed: Basic Metabolic Panel:  Recent Labs  11/05/14 1654 12/26/14 1430 12/26/14 1441 12/26/14 1953 01/04/15  NA 134* 137 142 142 137  K 4.6 4.1 4.1 4.3 4.6  CL 100* 100* 101 104  --   CO2 22 26  --  23  --   GLUCOSE 260* 169* 168* 100*  --   BUN 23* 18 21* 20 29*  CREATININE 1.18* 0.87 0.90 0.98 1.3*  CALCIUM 10.7* 9.7  --  9.9  --    Liver Function Tests:  Recent Labs  11/05/14 1654 12/26/14 1430 12/26/14 1953  AST 24 20 23   ALT 35 17 15  ALKPHOS 75 53 49  BILITOT 0.1* 0.2* 0.4  PROT 6.6 6.9 6.8  ALBUMIN 3.2* 3.7 3.5    Recent Labs  11/05/14 1654  LIPASE 50   No results for input(s): AMMONIA in the last 8760 hours. CBC:  Recent Labs  11/05/14 1654 12/26/14 1430  12/26/14 1953 12/28/14 0529 01/04/15  WBC 8.5 6.5  --  8.3 8.3 6.1  NEUTROABS 6.3 4.6  --  5.5  --   --   HGB 11.5* 12.3  < > 12.1 11.9* 10.6*  HCT 34.6* 37.1  < > 37.5 35.7* 33*  MCV 90.3 93.2  --  94.0 92.7  --   PLT 287 277  --  270 267 244  < > = values in this interval not displayed. Cardiac Enzymes:  Recent Labs  11/05/14 1706 11/05/14 2229 12/26/14 1953  TROPONINI <0.03 <0.03 <0.03   BNP: Invalid input(s): POCBNP CBG:  Recent Labs  12/28/14 2201 12/29/14 0733 12/29/14 1219  GLUCAP 212* 197* 278*     Assessment/Plan  Anemia Normal MCV. Likely has anemia of chronic disease but given her being on xarelto, monitor cbc. If has further drop, will guaiac her stool  Impaired renal function Has good urine output. No urinary complaints. Concern for acute renal failure.She is on metformin which can worsen her renal function. Also on xarelto. Encouraged hydration. Check bmp. Consider discontinuing metformin if renal function worsens. gfr >40 at present  Allergic  rhinitis With nasal discharge and congestion along with cough. Change azelastine and ipratropium nasal spray to daily for now. Continue daily allegra and levocetrizine. Add mucinex dm 600 mg bid x 1 week and reassess.  Constipation Improved and having some loose stools. Change miralax to daily prn only and change senna s to 1 tab daily for now and reassess  HTN Elevated SBP. Increase metoprolol tartrate to 25 mg bid for now with holding parameters. Monitor bp q shift.    Goals of care: short term rehabilitation   Labs/tests ordered: cbc, cmp  Family/ staff Communication: reviewed care plan with patient and nursing supervisor    Blanchie Serve, MD  [facility] [phone] (Monday-Friday 8 am - 5 pm) [phone] (afterhours)

## 2015-01-11 LAB — BASIC METABOLIC PANEL
BUN: 24 mg/dL — AB (ref 4–21)
CREATININE: 1.1 mg/dL (ref 0.5–1.1)
Glucose: 200 mg/dL
Potassium: 4.6 mmol/L (ref 3.4–5.3)
Sodium: 138 mmol/L (ref 137–147)

## 2015-01-11 LAB — CBC AND DIFFERENTIAL
HCT: 33 % — AB (ref 36–46)
Hemoglobin: 10.9 g/dL — AB (ref 12.0–16.0)
PLATELETS: 237 10*3/uL (ref 150–399)
WBC: 6.3 10*3/mL

## 2015-01-18 ENCOUNTER — Encounter: Payer: Self-pay | Admitting: Nurse Practitioner

## 2015-01-18 ENCOUNTER — Non-Acute Institutional Stay (SKILLED_NURSING_FACILITY): Payer: Medicare Other | Admitting: Nurse Practitioner

## 2015-01-18 DIAGNOSIS — R710 Precipitous drop in hematocrit: Secondary | ICD-10-CM | POA: Diagnosis not present

## 2015-01-18 DIAGNOSIS — I6990 Unspecified sequelae of unspecified cerebrovascular disease: Secondary | ICD-10-CM | POA: Diagnosis not present

## 2015-01-18 DIAGNOSIS — E118 Type 2 diabetes mellitus with unspecified complications: Secondary | ICD-10-CM | POA: Diagnosis not present

## 2015-01-18 DIAGNOSIS — Z794 Long term (current) use of insulin: Secondary | ICD-10-CM | POA: Diagnosis not present

## 2015-01-18 DIAGNOSIS — K5900 Constipation, unspecified: Secondary | ICD-10-CM | POA: Diagnosis not present

## 2015-01-18 DIAGNOSIS — IMO0002 Reserved for concepts with insufficient information to code with codable children: Secondary | ICD-10-CM

## 2015-01-18 DIAGNOSIS — E1165 Type 2 diabetes mellitus with hyperglycemia: Secondary | ICD-10-CM | POA: Diagnosis not present

## 2015-01-18 DIAGNOSIS — J302 Other seasonal allergic rhinitis: Secondary | ICD-10-CM

## 2015-01-18 DIAGNOSIS — I480 Paroxysmal atrial fibrillation: Secondary | ICD-10-CM

## 2015-01-18 DIAGNOSIS — M6289 Other specified disorders of muscle: Secondary | ICD-10-CM

## 2015-01-18 DIAGNOSIS — R531 Weakness: Secondary | ICD-10-CM

## 2015-01-18 DIAGNOSIS — R471 Dysarthria and anarthria: Secondary | ICD-10-CM | POA: Diagnosis not present

## 2015-01-18 NOTE — Progress Notes (Signed)
Patient ID: Abigail Boyd, female   DOB: May 22, 1929, 81 y.o.   MRN: [id number]    Nursing Home Location:  [place] of Service: SNF (31)  PCP: Morton Peters, MD  Allergies  Allergen Reactions  . Lisinopril Cough  . Losartan Cough  . Sulfa Antibiotics Hives    Chief Complaint  Patient presents with  . Discharge Note    Discharge from SNF    HPI:  Patient is a 81 y.o. female seen today at [facility] for discharge home.She has PMH of DM, GERD, HTN, HLD, RLS Pt at [facility] place for short term rehabilitation post hospital admission from 12/26/14-12/29/14 with acute left frontal CVA with dysarthria and right sided weakness. She was started on xarelto. She also had afib with RVR. Pts strength and gait has improved while in rehab but still has weakness of right lower extremity. Patient currently doing well with therapy, now stable to discharge home with home health.  Review of Systems:  Review of Systems  Constitutional: Negative for activity change, appetite change, fatigue and unexpected weight change.  HENT: Negative for congestion and hearing loss.   Eyes: Negative.   Respiratory: Negative for cough and shortness of breath.   Cardiovascular: Negative for chest pain, palpitations and leg swelling.  Gastrointestinal: Negative for abdominal pain, diarrhea and constipation.  Genitourinary: Negative for dysuria and difficulty urinating.  Musculoskeletal: Negative for myalgias and arthralgias.  Skin: Negative for color change and wound.  Neurological: Positive for weakness (to right leg). Negative for dizziness.  Psychiatric/Behavioral: Negative for behavioral problems, confusion and agitation.    Past Medical History  Diagnosis Date  . History of unstable angina 06/13/2011    Jaw pain awakening from sleep -- Myoview --CATH --> PCI  . Abnormal nuclear stress test 06/21/2011    Inferolateral reversible defect;--> cardiac cath & PCI  of CxOM, occluded RCA with collaterals;; followup Myoview 11/2012: Low risk. Fixed basal inferior artifact normal EF. No ischemia  . CAD S/P percutaneous coronary angioplasty     s/P PCI to proximal OM1 w/ DES; occluded RCA with bridging and L-R collaterals (medical management)  . Shortness of breath on exertion October 2013    2-D echo: Normal EF>55%, Gr 1DD, mild aortic sclerosis; Evaluated with CPET - peak VO2 97%; Chronotropic Incompetence (submaximal effort)  . Chronotropic incompetence with sinus node dysfunction [facility]) October 2013    On CPET test; beta blockers reduced  . PAF (paroxysmal atrial fibrillation) ([place]) 10-11/ 2014    CardioNet Event Monitor: NSR & S Brady -- Rates 50-100; Total A. fib burden 35 hours and 27 minutes. 1296 episodes, longest was 1 hour 29 minutes. Rate ranged from 52-169 beats per minute.  . Tachycardia-bradycardia syndrome ([place]) 11/2012  . History of syncope     Per EP - neurocardiogenic & not Bradycardia related (no PPM)  . OSA (obstructive sleep apnea)     hx bladder infections  . Diabetes mellitus type 2, controlled ([place])     On oral medications  . Essential hypertension     Allowing for permissive hypertension to avoid orthostatic hypotension  . Hyperlipidemia with target LDL less than 70     HDL at goal, LDL not at goal, borderline triglycerides. On Crestor 20 mg  . Chronic kidney disease (CKD), stage II (mild)     Related to current bladder infections (although diabetes cannot be excluded)  . Spinal stenosis of lumbar region 11/2011  .  Osteoarthritis   . GERD (gastroesophageal reflux disease)     On PPI  . Seasonal allergies   . Anxiety     When necessary Xanax  . Glaucoma   . Bilateral cataracts     Status post stroke or correction   Past Surgical History  Procedure Laterality Date  . Cataracts      both eyes  . Colonoscopy    . Vaginal hysterectomy    . Breast biopsy      both breast  . Cardiac catheterization  07/05/2011    Unstable  Angina --> Myoview wiht Inf-Lat Ischemia.  Proximal OM1 lesion --> PCI; 100% mid RCA with right to right and left to right bridging collaterals from circumflex RPL and LAD the PDA.  Marland Kitchen Coronary angioplasty with stent placement  07/05/2011    Promus Element DES 2.5 mm x 16 mm-post dilated to 2.65 mm CX-Proximal OM1  . Nm myoview ltd  October 2014     Low risk. Fixed basal inferior artifact normal EF. No ischemia --> (as compared to pre-PCI Myoview revealing inferolateral ischemia)  . Transthoracic echocardiogram  October 2013    Normal EF, >55%. No regional WMA, Grd 1 DD,  mildly sclerotic aortic valve without stenosis  . Left heart catheterization with coronary angiogram N/A 07/06/2011    Procedure: LEFT HEART CATHETERIZATION WITH CORONARY ANGIOGRAM;  Surgeon: Leonie Man, MD;  Location: [facility] CATH LAB;  Service: Cardiovascular;  Laterality: N/A;  . Knee arthroscopy with medial menisectomy Right 06/24/2014    Procedure: RIGHT KNEE ARTHROSCOPY WITH partial lateral MENISECTOMY, abrasion chondroplasty of medial femoral condyle and patella, microfracture technique;  Surgeon: Latanya Maudlin, MD;  Location: [facility] ORS;  Service: Orthopedics;  Laterality: Right;   Social History:   reports that she has never smoked. She has never used smokeless tobacco. She reports that she does not drink alcohol or use illicit drugs.  Family History  Problem Relation Age of Onset  . Colon cancer Neg Hx   . Esophageal cancer Neg Hx   . Stomach cancer Neg Hx   . COPD Mother   . Heart attack Father   . Heart disease Father   . Cancer Sister   . Cancer Sister   . Pulmonary fibrosis Mother   . Clotting disorder Sister   . Heart attack Sister   . Stroke Father     Medications: Patient's Medications  New Prescriptions   No medications on file  Previous Medications   ACETAMINOPHEN (TYLENOL) 650 MG CR TABLET    Take 1,300 mg by mouth 2 (two) times daily as needed for pain.   ALPRAZOLAM (XANAX) 0.25 MG TABLET    Take 1  tablet (0.25 mg total) by mouth at bedtime as needed for sleep or anxiety.   AZELASTINE HCL NA    Place 1 spray into both nostrils daily.    BACLOFEN PO    Take 5mg  by mouth daily for muscle spasm   CHOLECALCIFEROL (VITAMIN D) 1000 UNITS TABLET    Take 1,000 Units by mouth daily.   FEXOFENADINE (ALLEGRA) 180 MG TABLET    Take 180 mg by mouth at bedtime.    FISH OIL-OMEGA-3 FATTY ACIDS 1000 MG CAPSULE    Take 1 g by mouth daily.    INSULIN GLARGINE (LANTUS) 100 UNIT/ML SOPN    Inject 36 Units into the skin at bedtime.    INSULIN LISPRO (HUMALOG KWIKPEN) 100 UNIT/ML KIWKPEN    Inject 1 Units into the skin 3 (three) times  daily as needed (CBG >200).   IPRATROPIUM (ATROVENT) 0.06 % NASAL SPRAY    Place 1 spray into the nose daily.    LATANOPROST (XALATAN) 0.005 % OPHTHALMIC SOLUTION    Instill one drop into each eye every night at bedtime   MAGNESIUM 500 MG CAPS    Take 500 mg by mouth daily as needed (constipation).    METFORMIN (GLUCOPHAGE) 1000 MG TABLET    Take 1,000 mg by mouth 2 (two) times daily with a meal.   MIRABEGRON ER (MYRBETRIQ) 50 MG TB24 TABLET    Take one tablet by mouth once daily for kidneys   NITROGLYCERIN (NITROSTAT) 0.4 MG SL TABLET    Place 0.4 mg under the tongue every 5 (five) minutes as needed for chest pain.   NYSTATIN CREAM (MYCOSTATIN)    Apply 1 application topically daily.   OMEPRAZOLE (PRILOSEC) 20 MG CAPSULE    Take 20 mg by mouth daily.   PAROXETINE (PAXIL) 10 MG TABLET    Take 10 mg by mouth daily.    POLYETHYLENE GLYCOL (MIRALAX / GLYCOLAX) PACKET    Take 17 g by mouth daily as needed.    RIVAROXABAN (XARELTO) 15 MG TABS TABLET    Take 1 tablet (15 mg total) by mouth daily with supper.   ROPINIROLE (REQUIP) 0.5 MG TABLET    TAKE 1 TABLET AFTER DINNER EACH NIGHT.   ROSUVASTATIN (CRESTOR) 40 MG TABLET    Take 1 tablet (40 mg total) by mouth at bedtime.   SENNA-DOCUSATE (SENOKOT-S) 8.6-50 MG TABLET    Take 1 tablet by mouth at bedtime for constipation   SITAGLIPTIN  (JANUVIA) 50 MG TABLET    Take 50 mg by mouth at bedtime.   Modified Medications   Modified Medication Previous Medication   METOPROLOL TARTRATE (LOPRESSOR) 25 MG TABLET metoprolol tartrate (LOPRESSOR) 25 MG tablet      Take 1 tablet (25 mg total) by mouth 2 (two) times daily. HOLD for SBP <110 and HR < 60    Take 0.5 tablets (12.5 mg total) by mouth 2 (two) times daily.  Discontinued Medications   LEVOCETIRIZINE (XYZAL) 5 MG TABLET    Take 5 mg by mouth at bedtime.     Physical Exam: Filed Vitals:   01/18/15 1213  BP: 148/64  Pulse: 74  Temp: 97.3 F (36.3 C)  TempSrc: Oral  Resp: 17  SpO2: 95%    Physical Exam  Constitutional: She is oriented to person, place, and time. She appears well-developed and well-nourished. No distress.  HENT:  Head: Normocephalic and atraumatic.  Mouth/Throat: Oropharynx is clear and moist. No oropharyngeal exudate.  Eyes: Conjunctivae are normal. Pupils are equal, round, and reactive to light.  Neck: Normal range of motion. Neck supple.  Cardiovascular: Normal rate and normal heart sounds.  An irregular rhythm present.  Pulmonary/Chest: Effort normal and breath sounds normal.  Abdominal: Soft. Bowel sounds are normal.  Musculoskeletal: She exhibits no edema or tenderness.  Neurological: She is alert and oriented to person, place, and time.  Right sided weakness, greater in lower extremities vs upper.  right facial droop dysarthria noted  Skin: Skin is warm and dry. She is not diaphoretic.  Psychiatric: She has a normal mood and affect.    Labs reviewed: Basic Metabolic Panel:  Recent Labs  11/05/14 1654 12/26/14 1430 12/26/14 1441 12/26/14 1953 01/04/15 01/11/15  NA 134* 137 142 142 137 138  K 4.6 4.1 4.1 4.3 4.6 4.6  CL 100* 100* 101 104  --   --  CO2 22 26  --  23  --   --   GLUCOSE 260* 169* 168* 100*  --   --   BUN 23* 18 21* 20 29* 24*  CREATININE 1.18* 0.87 0.90 0.98 1.3* 1.1  CALCIUM 10.7* 9.7  --  9.9  --   --    Liver  Function Tests:  Recent Labs  11/05/14 1654 12/26/14 1430 12/26/14 1953  AST 24 20 23   ALT 35 17 15  ALKPHOS 75 53 49  BILITOT 0.1* 0.2* 0.4  PROT 6.6 6.9 6.8  ALBUMIN 3.2* 3.7 3.5    Recent Labs  11/05/14 1654  LIPASE 50   No results for input(s): AMMONIA in the last 8760 hours. CBC:  Recent Labs  11/05/14 1654 12/26/14 1430  12/26/14 1953 12/28/14 0529 01/04/15 01/11/15  WBC 8.5 6.5  --  8.3 8.3 6.1 6.3  NEUTROABS 6.3 4.6  --  5.5  --   --   --   HGB 11.5* 12.3  < > 12.1 11.9* 10.6* 10.9*  HCT 34.6* 37.1  < > 37.5 35.7* 33* 33*  MCV 90.3 93.2  --  94.0 92.7  --   --   PLT 287 277  --  270 267 244 237  < > = values in this interval not displayed. TSH: No results for input(s): TSH in the last 8760 hours. A1C: Lab Results  Component Value Date   HGBA1C 8.4* 12/27/2014   Lipid Panel:  Recent Labs  12/27/14 0735  CHOL 163  HDL 39*  LDLCALC 98  TRIG 131  CHOLHDL 4.2    Radiological Exams: Ct Head Wo Contrast  12/26/2014  CLINICAL DATA:  Numbness and weakness of the right hand and leg. Slurred speech. The onset of symptoms was 4 days ago. EXAM: CT HEAD WITHOUT CONTRAST TECHNIQUE: Contiguous axial images were obtained from the base of the skull through the vertex without intravenous contrast. COMPARISON:  None. FINDINGS: Brain: No evidence of acute cortical infarction, hemorrhage, extra-axial collection, ventriculomegaly, or mass effect. There is moderate brain parenchymal atrophy and chronic small vessel disease changes. More focal area of hypoattenuation is seen in the left supratentorial subcortical white matter, underneath the central sulcus. Vascular: No hyperdense vessel or unexpected calcification. Skull: Negative for fracture or focal lesion. Sinuses/Orbits: No acute findings. Other: Pinial gland calcifications are seen. IMPRESSION: Area of hypoattenuation in the subcortical white matter of the left supratentorial brain. Acute infarction cannot be excluded.  Further evaluation with MRI of the brain may be considered. Chronic brain parenchymal atrophy and deep white matter microvascular disease. These results were called by telephone at the time of interpretation on 12/26/2014 at 4:36 pm to Ms. Chrisco, RN from [facility] emergency department, who verbally acknowledged these results. Electronically Signed   By: Fidela Salisbury M.D.   On: 12/26/2014 16:40   Mr Brain Wo Contrast  12/26/2014  CLINICAL DATA:  Speech difficulty and RIGHT hemiparesis. Symptoms began 4 days ago. Patient is anticoagulated. EXAM: MRI HEAD WITHOUT CONTRAST MRA HEAD WITHOUT CONTRAST TECHNIQUE: Multiplanar, multiecho pulse sequences of the brain and surrounding structures were obtained without intravenous contrast. Angiographic images of the head were obtained using MRA technique without contrast. COMPARISON:  CT head earlier today. FINDINGS: MRI HEAD FINDINGS As suspected from CT, there is an ovoid 10 x 15 mm area of restricted diffusion representing acute infarction involving the LEFT frontal cortex and subcortical white matter. This is nonhemorrhagic. No other areas of acute infarction are seen. No mass  lesion.  No extra-axial fluid. Global atrophy. Hydrocephalus ex vacuo. Extensive T2 and FLAIR hyperintensity throughout the white matter consistent with small vessel disease. Scattered areas of remote lacunar infarction. Flow is maintained in the internal carotid arteries, basilar artery, and both vertebral arteries. Unremarkable pituitary and cerebellar tonsils. No osseous findings. BILATERAL cataract extraction. No sinus or mastoid disease. Extracranial soft tissues unremarkable. MRA HEAD FINDINGS Minor non-stenotic irregularity of both internal carotid arteries. Basilar artery widely patent with vertebrals codominant, and widely patent. No significant proximal stenosis of the anterior, middle, or posterior cerebral arteries. No cerebellar branch occlusion. No intracranial aneurysm is  evident. Moderately diseased RIGHT anterior cerebral artery in its distal A2 segment. Moderately diseased BILATERAL middle cerebral artery M3 branches, which on the LEFT could contribute to the observed pattern of infarction. IMPRESSION: Acute LEFT frontal cortical and subcortical infarction, 10 x 15 mm, nonhemorrhagic. Severe atrophy with extensive small vessel disease. No proximal large vessel stenosis or occlusion. Intracranial atherosclerotic change involves the distal anterior and middle cerebral artery branches. Electronically Signed   By: Staci Righter M.D.   On: 12/26/2014 21:35   Mr Jodene Nam Head/brain Wo Cm  12/26/2014  CLINICAL DATA:  Speech difficulty and RIGHT hemiparesis. Symptoms began 4 days ago. Patient is anticoagulated. EXAM: MRI HEAD WITHOUT CONTRAST MRA HEAD WITHOUT CONTRAST TECHNIQUE: Multiplanar, multiecho pulse sequences of the brain and surrounding structures were obtained without intravenous contrast. Angiographic images of the head were obtained using MRA technique without contrast. COMPARISON:  CT head earlier today. FINDINGS: MRI HEAD FINDINGS As suspected from CT, there is an ovoid 10 x 15 mm area of restricted diffusion representing acute infarction involving the LEFT frontal cortex and subcortical white matter. This is nonhemorrhagic. No other areas of acute infarction are seen. No mass lesion.  No extra-axial fluid. Global atrophy. Hydrocephalus ex vacuo. Extensive T2 and FLAIR hyperintensity throughout the white matter consistent with small vessel disease. Scattered areas of remote lacunar infarction. Flow is maintained in the internal carotid arteries, basilar artery, and both vertebral arteries. Unremarkable pituitary and cerebellar tonsils. No osseous findings. BILATERAL cataract extraction. No sinus or mastoid disease. Extracranial soft tissues unremarkable. MRA HEAD FINDINGS Minor non-stenotic irregularity of both internal carotid arteries. Basilar artery widely patent with  vertebrals codominant, and widely patent. No significant proximal stenosis of the anterior, middle, or posterior cerebral arteries. No cerebellar branch occlusion. No intracranial aneurysm is evident. Moderately diseased RIGHT anterior cerebral artery in its distal A2 segment. Moderately diseased BILATERAL middle cerebral artery M3 branches, which on the LEFT could contribute to the observed pattern of infarction. IMPRESSION: Acute LEFT frontal cortical and subcortical infarction, 10 x 15 mm, nonhemorrhagic. Severe atrophy with extensive small vessel disease. No proximal large vessel stenosis or occlusion. Intracranial atherosclerotic change involves the distal anterior and middle cerebral artery branches. Electronically Signed   By: Staci Righter M.D.   On: 12/26/2014 21:35    Assessment/Plan 1. Late effects of CVA (cerebrovascular accident) Stable, Recent acute left frontal infarct, Continue xarelto 15 mg daily, crestor 40 mg daily and metoprolol for blood pressure.  conts on baclofen 5 mg po daily for now to help with RLE spasticity.   2. Dysarthria After CVA conts with dysarthria, will have [place] ST to evaluate and treat.   3. Right sided weakness Post CVA with RLE> RUE, working with therapy for strength and gait training. Stable and has had improvement with rehab  4. PAF (paroxysmal atrial fibrillation) (HCC) Rate controlled, cont xarelto and metoprolol  5.  Uncontrolled type 2 diabetes mellitus with complication, with long-term current use of insulin (HCC) Stable, Continue lantus 36 u daily with metformin 1000 mg bid and januvia 50 mg daily and humalog for blood sugar over 200.   6. Constipation, unspecified constipation type Controlled with current regimen. To cont OTC medication on discharge  7. Drop in hemoglobin hgb stable, trending up. Will need ongoing follow up by PCP  8. Other seasonal allergic rhinitis Stable, conts on nasal sprays and allegra  pt is stable for discharge-will  need PT/OT/hha/ST per home health.  DME needed includes FWW and 3n1. Rx written.  will need to follow up with PCP within 2 weeks.    Carlos American. Harle Battiest  [facility] [phone] 8 am - 5 pm) [phone] (after hours)

## 2015-01-25 ENCOUNTER — Other Ambulatory Visit: Payer: Self-pay | Admitting: Cardiology

## 2015-01-25 DIAGNOSIS — E1122 Type 2 diabetes mellitus with diabetic chronic kidney disease: Secondary | ICD-10-CM | POA: Diagnosis not present

## 2015-01-25 DIAGNOSIS — I69351 Hemiplegia and hemiparesis following cerebral infarction affecting right dominant side: Secondary | ICD-10-CM | POA: Diagnosis not present

## 2015-01-25 DIAGNOSIS — N182 Chronic kidney disease, stage 2 (mild): Secondary | ICD-10-CM | POA: Diagnosis not present

## 2015-01-25 DIAGNOSIS — E1165 Type 2 diabetes mellitus with hyperglycemia: Secondary | ICD-10-CM | POA: Diagnosis not present

## 2015-01-25 DIAGNOSIS — I129 Hypertensive chronic kidney disease with stage 1 through stage 4 chronic kidney disease, or unspecified chronic kidney disease: Secondary | ICD-10-CM | POA: Diagnosis not present

## 2015-01-25 DIAGNOSIS — I480 Paroxysmal atrial fibrillation: Secondary | ICD-10-CM | POA: Diagnosis not present

## 2015-01-25 NOTE — Telephone Encounter (Signed)
°*  STAT* If patient is at the pharmacy, call can be transferred to refill team.   1. Which medications need to be refilled? (please list name of each medication and dose if known) Xarelto .Marland Kitchen Needs a new prescription   2. Which pharmacy/location (including street and city if local pharmacy) [facility] [address] [facility] [place]   3. Do they need a 30 day or 90 day supply? [place]

## 2015-01-26 MED ORDER — RIVAROXABAN 15 MG PO TABS: 15.0000 mg | ORAL_TABLET | Freq: Every day | ORAL | Status: DC

## 2015-01-26 NOTE — Telephone Encounter (Signed)
Rx(s) sent to pharmacy electronically.  

## 2015-01-27 ENCOUNTER — Other Ambulatory Visit: Payer: Self-pay | Admitting: Pharmacist Clinician (PhC)/ Clinical Pharmacy Specialist

## 2015-01-27 ENCOUNTER — Other Ambulatory Visit (INDEPENDENT_AMBULATORY_CARE_PROVIDER_SITE_OTHER): Payer: Medicare Other | Admitting: *Deleted

## 2015-01-27 ENCOUNTER — Ambulatory Visit (INDEPENDENT_AMBULATORY_CARE_PROVIDER_SITE_OTHER): Payer: Medicare Other | Admitting: *Deleted

## 2015-01-27 ENCOUNTER — Other Ambulatory Visit: Payer: Self-pay

## 2015-01-27 ENCOUNTER — Telehealth: Payer: Self-pay | Admitting: *Deleted

## 2015-01-27 DIAGNOSIS — I10 Essential (primary) hypertension: Secondary | ICD-10-CM

## 2015-01-27 DIAGNOSIS — I2510 Atherosclerotic heart disease of native coronary artery without angina pectoris: Secondary | ICD-10-CM

## 2015-01-27 DIAGNOSIS — I480 Paroxysmal atrial fibrillation: Secondary | ICD-10-CM

## 2015-01-27 NOTE — Telephone Encounter (Signed)
Pt requiring PA for Xarelto 15 mg. PA has been faxed to [facility] Awaiting approval.

## 2015-01-27 NOTE — Progress Notes (Signed)
Called  Daughter, [name]- pt is staying with her at present. [phone]. Instructed her to continue Xarelto 15mg s QD with evening meal.   Pt was started on Xarelto 15mg  for Afib on 12/28/14 in hospital.    Reviewed patients medication list.  Pt is not  currently on any combined P-gp and strong CYP3A4 inhibitors/inducers (ketoconazole, traconazole, ritonavir, carbamazepine, phenytoin, rifampin, St. John's wort).  Reviewed labs.  SCr 1.14,   Weight 74 kg, Age 78yrs.  CrCl- 43.68.  Dose  Appropriate based on CrCl.   Hgb and HCT 11.0/33.6.   A full discussion of the nature of anticoagulants has been carried out.  A benefit/risk analysis has been presented to the patient, so that they understand the justification for choosing anticoagulation with Xarelto at this time.  The need for compliance is stressed.  Pt is aware to take the medication once daily with the largest meal of the day.  Side effects of potential bleeding are discussed, including unusual colored urine or stools, coughing up blood or coffee ground emesis, nose bleeds or serious fall or head trauma.  Discussed signs and symptoms of stroke. The patient should avoid any OTC items containing aspirin or ibuprofen.  Avoid alcohol consumption.   Call if any signs of abnormal bleeding.  Discussed financial obligations and resolved any difficulty in obtaining medication.  Next lab test test in 5-6 mths when pt sees Dr Ellyn Hack.

## 2015-01-28 ENCOUNTER — Telehealth: Payer: Self-pay | Admitting: *Deleted

## 2015-01-28 ENCOUNTER — Telehealth: Payer: Self-pay

## 2015-01-28 LAB — BASIC METABOLIC PANEL
BUN / CREAT RATIO: 27 — AB (ref 11–26)
BUN: 31 mg/dL — ABNORMAL HIGH (ref 8–27)
CO2: 21 mmol/L (ref 18–29)
CREATININE: 1.14 mg/dL — AB (ref 0.57–1.00)
Calcium: 9.5 mg/dL (ref 8.7–10.3)
Chloride: 103 mmol/L (ref 97–106)
GFR, EST AFRICAN AMERICAN: 51 mL/min/{1.73_m2} — AB (ref >59)
GFR, EST NON AFRICAN AMERICAN: 45 mL/min/{1.73_m2} — AB (ref >59)
Glucose: 138 mg/dL — ABNORMAL HIGH (ref 65–99)
POTASSIUM: 4.8 mmol/L (ref 3.5–5.2)
SODIUM: 143 mmol/L (ref 136–144)

## 2015-01-28 LAB — CBC
HEMATOCRIT: 33.6 % — AB (ref 34.0–46.6)
HEMOGLOBIN: 11 g/dL — AB (ref 11.1–15.9)
MCH: 30.6 pg (ref 26.6–33.0)
MCHC: 32.7 g/dL (ref 31.5–35.7)
MCV: 93 fL (ref 79–97)
Platelets: 262 10*3/uL (ref 150–379)
RBC: 3.6 x10E6/uL — ABNORMAL LOW (ref 3.77–5.28)
RDW: 13.8 % (ref 12.3–15.4)
WBC: 8.9 10*3/uL (ref 3.4–10.8)

## 2015-01-28 NOTE — Telephone Encounter (Signed)
Pt daughter calling stating they came yesterday for coumadin, and she is calling now to let us know that pt had stoke oct 27 th. When into hospital at [facility] and then moved to [facility] 12/29/14.  Then released 01/21/15.  During that time Dr Guinevere Ferrari increased metoprolol from half in morning and in evening to 1 whole in AM and PM  Pt c/o BP issue: STAT if pt c/o blurred vision, one-sided weakness or slurred speech  1. What are your last 5 BP readings?  01/21/15 146/80 71 01/22/15 150/77 72 01/23/15 143/79 69 01/24/15 159/90 67  2. Are you having any other symptoms (ex. Dizziness, headache, blurred vision, passed out)? Very tired and not sure if that's lowered her BP but its lowered her engery  3. What is your BP issue? A bit high even after the change in Medicaine  Pt c/o swelling: STAT is pt has developed SOB within 24 hours  1. How long have you been experiencing swelling? Been going on since [facility]    2. Where is the swelling located? Both ankle   3.  Are you currently taking a "fluid pill"? No   4.  Are you currently SOB? Not really.   5.  Have you traveled recently? No

## 2015-01-28 NOTE — Telephone Encounter (Signed)
[facility] [place] This was initiated yesterday by [facility] office.

## 2015-01-29 ENCOUNTER — Other Ambulatory Visit: Payer: Self-pay

## 2015-01-29 MED ORDER — FUROSEMIDE 20 MG PO TABS: 20.0000 mg | ORAL_TABLET | Freq: Every day | ORAL | Status: DC | PRN

## 2015-01-29 NOTE — Telephone Encounter (Signed)
I'm pretty much okay with these blood pressures. If there is worsening edema, we can potentially consider using low-dose Lasix maybe 20 mg a day as needed for swelling.  Please let the family know that I am sorry to hear that she has had a stroke.  Leonie Man, M.D., M.S. Interventional Cardiologist   Pager # [phone]

## 2015-01-29 NOTE — Telephone Encounter (Signed)
S/w pt daughter of recommendations. Daughter is agreeable w/plan and understands lasix is PRN Prescription submitted

## 2015-01-29 NOTE — Telephone Encounter (Signed)
Pt was approved for Xarelto 15 mg until 01/27/16.

## 2015-01-29 NOTE — Telephone Encounter (Signed)
S/w pt daughter, Estill Batten, who reports pt  CVA 10/29, d/c'd to rehab at [facility] x 1 month. At [facility], MD increased metoprolol from 12/5mg  BID to 25mg  BID. Pt is home now with daughter. Daughter reports pt increased swelling bilateral ankles with no energy.  BP readings listed below. Per daughter, these readings are higher than normal.  Advised pt daughter to continue monitoring BP and I will forward to Dr. Ellyn Hack for review and recommendations.

## 2015-02-25 ENCOUNTER — Other Ambulatory Visit: Payer: Self-pay | Admitting: Internal Medicine

## 2015-03-03 ENCOUNTER — Ambulatory Visit: Payer: Medicare Other | Admitting: Neurology

## 2015-03-04 ENCOUNTER — Telehealth: Payer: Self-pay | Admitting: Cardiology

## 2015-03-04 NOTE — Telephone Encounter (Signed)
Pt still having swelling in ankles and legs, on 20mg  lasix PRN but now taking daily.  Advised to incr dose to 40mg  for 3 days, call if no improvement. Explained should be fine to go back to 20mg  daily after this, but may need to increase dose & keep at 40mg  if swelling returns.   Will route to Dr. Ellyn Hack for any further advice.

## 2015-03-04 NOTE — Telephone Encounter (Signed)
Pt c/o swelling: STAT is pt has developed SOB within 24 hours  1. How long have you been experiencing swelling? About a week   2. Where is the swelling located? It started in her ankles and moved up to her legs   3.  Are you currently taking a "fluid pill"? Furosemide 20mg ( take 1 tab po daily prn)  4.  Are you currently SOB? No   5.  Have you traveled recently?I did not ask

## 2015-03-04 NOTE — Telephone Encounter (Signed)
Agree with this plan.  Leonie Man, MD

## 2015-03-05 ENCOUNTER — Ambulatory Visit: Payer: Medicare Other | Admitting: Cardiology

## 2015-03-05 NOTE — Telephone Encounter (Signed)
Abigail Boyd given instruction confirmation and voiced understanding.

## 2015-03-05 NOTE — Telephone Encounter (Signed)
Returned call to Rona Ravens (daughter, DPR) who I spoke w/ yesterday, to confirm instructions. LMTCB.

## 2015-03-10 ENCOUNTER — Telehealth: Payer: Self-pay | Admitting: Cardiology

## 2015-03-10 MED ORDER — FUROSEMIDE 20 MG PO TABS: 20.0000 mg | ORAL_TABLET | Freq: Every day | ORAL | Status: DC | PRN

## 2015-03-10 NOTE — Telephone Encounter (Signed)
°*  STAT* If patient is at the pharmacy, call can be transferred to refill team.   1. Which medications need to be refilled? (please list name of each medication and dose if known) Lasix 40 mg -need new prescription  2. Which pharmacy/location (including street and city if local pharmacy) [phone]  3. Do they need a 30 day or 90 day supply?90 and refills

## 2015-03-15 ENCOUNTER — Encounter: Payer: Self-pay | Admitting: Family Medicine

## 2015-03-15 ENCOUNTER — Ambulatory Visit (INDEPENDENT_AMBULATORY_CARE_PROVIDER_SITE_OTHER): Payer: Medicare Other | Admitting: Family Medicine

## 2015-03-15 VITALS — BP 134/72 | HR 69 | Temp 98.2°F | Ht 64.0 in | Wt 173.4 lb

## 2015-03-15 DIAGNOSIS — R600 Localized edema: Secondary | ICD-10-CM | POA: Diagnosis not present

## 2015-03-15 DIAGNOSIS — N3941 Urge incontinence: Secondary | ICD-10-CM | POA: Diagnosis not present

## 2015-03-15 DIAGNOSIS — K5900 Constipation, unspecified: Secondary | ICD-10-CM | POA: Insufficient documentation

## 2015-03-15 DIAGNOSIS — I63031 Cerebral infarction due to thrombosis of right carotid artery: Secondary | ICD-10-CM

## 2015-03-15 DIAGNOSIS — M25561 Pain in right knee: Secondary | ICD-10-CM | POA: Insufficient documentation

## 2015-03-15 DIAGNOSIS — R3915 Urgency of urination: Secondary | ICD-10-CM

## 2015-03-15 DIAGNOSIS — K921 Melena: Secondary | ICD-10-CM

## 2015-03-15 LAB — POCT URINALYSIS DIPSTICK
Bilirubin, UA: NEGATIVE
Glucose, UA: NEGATIVE
KETONES UA: NEGATIVE
LEUKOCYTES UA: NEGATIVE
Nitrite, UA: NEGATIVE
PH UA: 5.5
PROTEIN UA: NEGATIVE
Spec Grav, UA: 1.01
UROBILINOGEN UA: 0.2

## 2015-03-15 NOTE — Assessment & Plan Note (Addendum)
Patient notes intermittent history of hematochezia that she relates to internal hemorrhoids due to her constipation. I advised patient that she needed rectal exam today to evaluate this further and that she would benefit from a GI referral and evaluation, though she declined these options stating that if it is still present at her next visit we could attempt a rectal exam. She wants to attempt treatment of her constipation and see if that resolves it first. Vital signs are stable. She is asymptomatic. She does not have abdominal pain with this. She will start back on MiraLAX for constipation. We'll check a CBC. She will monitor for further bleeding. She was given verbal return precautions regarding her bleeding as she brought this issue up after the AVS was printed.

## 2015-03-15 NOTE — Assessment & Plan Note (Signed)
Status post CVA. Appears to be doing well today. Neurologically intact. Is on anticoagulation. Is on statin. We'll write for patient to continue home physical therapy and occupational therapy. Patient will continue to monitor. Given return precautions.

## 2015-03-15 NOTE — Assessment & Plan Note (Signed)
Patient reports intermittent constipation for a long time. She is already on a bowel regimen. Did have a bowel movement today. NABS today. Benign abdominal exam. She does report some intermittent hematochezia. This could be related to her reported internal hemorrhoids or other cause. I advised rectal exam today, though she declined this opting to wait until her next visit. She wanted to trial treatment of constipation first. Patient will start back on MiraLAX. She will monitor for further bleeding. She's given return precautions.

## 2015-03-15 NOTE — Progress Notes (Signed)
Pre visit review using our clinic review tool, if applicable. No additional management support is needed unless otherwise documented below in the visit note. 

## 2015-03-15 NOTE — Assessment & Plan Note (Addendum)
Patient with 3-4 weeks of bilateral lower extremity edema. No CHF symptoms. Does have known kidney dysfunction. No history of liver or thyroid dysfunction. No apparent recent significant anemia. Has not responded to Lasix or elevation of legs. Bilateral nature makes it unlikely to be related to DVT. Could be venous insufficiency. We will check CBC, CMP, and TSH. She will continue to elevate her legs. Given return precautions.

## 2015-03-15 NOTE — Progress Notes (Signed)
Patient ID: MCKENZEY PARCELL, female   DOB: 17-Mar-1929, 82 y.o.   MRN: [id number]  Tommi Rumps, MD Phone: [phone]  ARIAUNA FARABEE is a 82 y.o. female who presents today for new patient visit  Urge incontinence: Patient notes she's had issues with urge incontinence for a number of years. Notes she is on 2 medications for this. Last several days she has had urgency but on occasion will get to the toilet and not have to urinate. She notes no dysuria or frequency. Notes she is on Vesicare and Myrbetriq. She notes these help. She notes she is still urinating. She does have a history of UTI in the past. No abdominal pain.  Bilateral lower extremity edema: Patient notes for the last 3-4 weeks she has had swelling in her bilateral ankles up to her mid shins. She denies history of heart failure. No orthopnea or PND. She does have a history of kidney dysfunction. No history of liver or thyroid dysfunction. Notes she was anemic many years ago. Notes it does not improve with propping her legs up. She was placed on furosemide by one of her physicians and had increased the dose with no improvement. She's on anticoagulation. Swelling is in both feet. No history of blood clot.  CVA: Patient status post CVA. Notes she had a stroke in October. Notes her only residual deficits are right hand dysfunction. Notes she has difficulty writing. She notes she has her strength and her speech back to normal. She is being followed by physical therapy and occupational therapy at home.  Right knee meniscal issue: Patient notes last year she had a torn meniscus that was operated on. Notes since that time she has had discomfort in her right knee intermittently. Notes she had some pain and it buckles occasionally. There is no swelling or erythema or warmth. She had an injection last Thanksgiving by her orthopedist that was beneficial.  Constipation: At the end of the visit just prior to the patient leaving the office she notes a long  history of constipation. She takes magnesium and Senokot for this. She also takes several probiotics to help with this. She's previously been on MiraLAX, though does not take this at this time. She denies abdominal pain. She had a bowel movement today. She does note intermittently having bright red blood per rectum. She notes this can sometimes be a small amount and other times color the toilet water. Last bleeding episode was today. She reports a history of internal hemorrhoids. She denies pain with bowel movements. She denies chest pain, shortness of breath, lightheadedness, palpitations, and abdominal pain.  Active Ambulatory Problems    Diagnosis Date Noted  . Unstable angina - history of; resolved, status post PCI of the OM 06/13/2011  . Hyperlipidemia with target LDL less than 70   . Essential hypertension   . Chronic kidney disease   . Spinal stenosis   . Diabetes mellitus   . CAD-PCI OM1, 100% RCA (L-R colllaterals) 07/07/2011  . PAF (paroxysmal atrial fibrillation) ([place]) 05/16/2012  . Long term (current) use of anticoagulants 05/16/2012  . Chronotropic incompetence - partially medication related 08/18/2012  . History of syncope: Unclear etiology 12/19/2012  . Tachy-brady syndrome: Rates range from 50-169 bpm in A. fib 12/20/2012  . Obstructive sleep apnea 03/05/2013  . PLMD (periodic limb movement disorder) 06/12/2013  . Preop cardiovascular exam 06/10/2014  . Cerebrovascular accident (CVA) ([place])   . CKD (chronic kidney disease), stage II   . Uncontrolled type 2 diabetes  mellitus with complication, with long-term current use of insulin ([place])   . Urge incontinence 03/15/2015  . Bilateral lower extremity edema 03/15/2015  . Constipation 03/15/2015  . Right knee pain 03/15/2015  . Hematochezia 03/15/2015   Resolved Ambulatory Problems    Diagnosis Date Noted  . Bradycardia 12/19/2012  . Hypotension 12/19/2012  . Stroke (cerebrum) ([place]) 12/26/2014  . CVA (cerebral infarction)  12/26/2014  . CAD in native artery   . HLD (hyperlipidemia)   . OSA (obstructive sleep apnea)   . Paroxysmal atrial fibrillation ([facility])   . Tachycardia-bradycardia syndrome [facility])    Past Medical History  Diagnosis Date  . History of unstable angina 06/13/2011  . Abnormal nuclear stress test 06/21/2011  . Shortness of breath on exertion October 2013  . Chronotropic incompetence with sinus node dysfunction [facility]) October 2013  . Diabetes mellitus type 2, controlled ([place])   . Chronic kidney disease (CKD), stage II (mild)   . Spinal stenosis of lumbar region 11/2011  . Osteoarthritis   . GERD (gastroesophageal reflux disease)   . Seasonal allergies   . Anxiety   . Glaucoma   . Bilateral cataracts   . Diverticulitis   . Migraines   . Stroke [facility])     Family History  Problem Relation Age of Onset  . Colon cancer Neg Hx   . Esophageal cancer Neg Hx   . Stomach cancer Neg Hx   . COPD Mother   . Heart attack Father   . Heart disease Father   . Cancer Sister   . Cancer Sister   . Pulmonary fibrosis Mother   . Clotting disorder Sister   . Heart attack Sister   . Stroke Father   . Arthritis    . Breast cancer Sister     Social History   Social History  . Marital Status: Widowed    Spouse Name: N/A  . Number of Children: N/A  . Years of Education: N/A   Occupational History  . retired    Social History Main Topics  . Smoking status: Never Smoker   . Smokeless tobacco: Never Used  . Alcohol Use: No  . Drug Use: No  . Sexual Activity: No   Other Topics Concern  . Not on file   Social History Narrative   Widowed mother of one, grandmother 2. Has not been using silver take as much lately due to knee discomfort. Usually exercises with Silver Sneakers on regular basis.    ROS   General:  Negative for nexplained weight loss, fever Skin: Negative for new or changing mole, sore that won't heal HEENT: Positive for trouble hearing, tinnitus, trouble seeing, Negative  for mouth sores, hoarseness, change in voice, dysphagia. CV:  Positive for edema, Negative for chest pain, dyspnea, palpitations Resp: Negative for cough, dyspnea, hemoptysis GI: Positive for constipation, hematochezia, Negative for nausea, vomiting, diarrhea, abdominal pain, melena GU: Positive for urinary incontinence, Negative for dysuria, urinary hesitance, hematuria, vaginal or penile discharge, polyuria, sexual difficulty, lumps in testicle or breasts MSK: Positive for muscle cramps or aches, joint pain or swelling Neuro: Negative for headaches, weakness, numbness, dizziness, passing out/fainting Psych: Negative for depression, anxiety, memory problems  Objective  Physical Exam Filed Vitals:   03/15/15 1535  BP: 134/72  Pulse: 69  Temp: 98.2 F (36.8 C)    BP Readings from Last 3 Encounters:  03/15/15 134/72  01/18/15 148/64  01/06/15 148/82   Wt Readings from Last 3 Encounters:  03/15/15 173 lb 6.4  oz (78.654 kg)  01/06/15 158 lb (71.668 kg)  12/28/14 158 lb 3.2 oz (71.759 kg)    Physical Exam  Constitutional: No distress.  HENT:  Head: Normocephalic and atraumatic.  Right Ear: External ear normal.  Left Ear: External ear normal.  Mouth/Throat: Oropharynx is clear and moist. No oropharyngeal exudate.  Eyes: Conjunctivae are normal. Pupils are equal, round, and reactive to light.  Neck: Neck supple.  Cardiovascular: Normal rate, regular rhythm and normal heart sounds.  Exam reveals no gallop and no friction rub.   No murmur heard. Trace edema lower extremities  Pulmonary/Chest: Effort normal and breath sounds normal. No respiratory distress. She has no wheezes. She has no rales.  Abdominal: Soft. Bowel sounds are normal. She exhibits no distension and no mass. There is no tenderness. There is no rebound and no guarding.  Genitourinary:  Patient declined rectal exam today  Musculoskeletal:  Left knee with no swelling, erythema, warmth, joint line tenderness, or  ligamentous laxity, negative McMurray's Right knee with no swelling, erythema, warmth, joint line tenderness, or ligament is laxity, negative McMurray's  Lymphadenopathy:    She has no cervical adenopathy.  Neurological: She is alert.  CN 2-12 intact, 5/5 strength in bilateral biceps, triceps, grip, quads, hamstrings, plantar and dorsiflexion, sensation to light touch intact in bilateral UE and LE, slow gait aided by walker, 2+ patellar reflexes  Skin: Skin is warm and dry. She is not diaphoretic.  Psychiatric: Mood and affect normal.   no calf swelling, tenderness, or cords   Assessment/Plan:   Cerebrovascular accident (CVA) ([place]) Status post CVA. Appears to be doing well today. Neurologically intact. Is on anticoagulation. Is on statin. We'll write for patient to continue home physical therapy and occupational therapy. Patient will continue to monitor. Given return precautions.  Urge incontinence History of most consistent with urge incontinence. No other symptoms to indicate UTI. UA with trace blood though no nitrites or leukocytes making infection less likely. Discussed that this is likely related to her known urge incontinence. Discussed that trace blood could potentially mean infection. We will send urine for culture and microscopy to evaluate further. I discussed the option of treating her with antibiotics prior to culture results, though given that she is well-appearing and this may potentially not be related to infection given chronicity and lack of other infectious findings on UA we will await urine culture results and microscopy results prior treating with antibiotics. Discussed signs of infection that they need to call regarding. Given return precautions.  Bilateral lower extremity edema Patient with 3-4 weeks of bilateral lower extremity edema. No CHF symptoms. Does have known kidney dysfunction. No history of liver or thyroid dysfunction. No apparent recent significant anemia. Has  not responded to Lasix or elevation of legs. Bilateral nature makes it unlikely to be related to DVT. Could be venous insufficiency. We will check CBC, CMP, and TSH. She will continue to elevate her legs. Given return precautions.  Constipation Patient reports intermittent constipation for a long time. She is already on a bowel regimen. Did have a bowel movement today. NABS today. Benign abdominal exam. She does report some intermittent hematochezia. This could be related to her reported internal hemorrhoids or other cause. I advised rectal exam today, though she declined this opting to wait until her next visit. She wanted to trial treatment of constipation first. Patient will start back on MiraLAX. She will monitor for further bleeding. She's given return precautions.  Right knee pain Suspect this is related to  arthritis. Benign exam today. She is followed by orthopedics for this. I advised that she follow up with them for this issue.  Hematochezia Patient notes intermittent history of hematochezia that she relates to internal hemorrhoids due to her constipation. I advised patient that she needed rectal exam today to evaluate this further and that she would benefit from a GI referral and evaluation, though she declined these options stating that if it is still present at her next visit we could attempt a rectal exam. She wants to attempt treatment of her constipation and see if that resolves it first. Vital signs are stable. She is asymptomatic. She does not have abdominal pain with this. She will start back on MiraLAX for constipation. We'll check a CBC. She will monitor for further bleeding. She was given verbal return precautions regarding her bleeding as she brought this issue up after the AVS was printed.    Orders Placed This Encounter  Procedures  . Urine Culture  . Comp Met (CMET)  . TSH  . CBC  . Urine Microscopic Only  . POCT Urinalysis Dipstick    Tommi Rumps

## 2015-03-15 NOTE — Patient Instructions (Signed)
Nice to meet you. We will check lab work.  Please follow-up with orthopedics for your right knee. Please keep your legs elevated as much as possible to help limit the swelling.  We will send a fax for your PT and OT. If you develop abdominal pain, nausea, vomiting, diarrhea, increased swelling, shortness of breath, chest pain, palpitations, numbness, weakness, vision changes, or any new or change in symptoms please seek medical attention.

## 2015-03-15 NOTE — Assessment & Plan Note (Signed)
History of most consistent with urge incontinence. No other symptoms to indicate UTI. UA with trace blood though no nitrites or leukocytes making infection less likely. Discussed that this is likely related to her known urge incontinence. Discussed that trace blood could potentially mean infection. We will send urine for culture and microscopy to evaluate further. I discussed the option of treating her with antibiotics prior to culture results, though given that she is well-appearing and this may potentially not be related to infection given chronicity and lack of other infectious findings on UA we will await urine culture results and microscopy results prior treating with antibiotics. Discussed signs of infection that they need to call regarding. Given return precautions.

## 2015-03-15 NOTE — Assessment & Plan Note (Signed)
Suspect this is related to arthritis. Benign exam today. She is followed by orthopedics for this. I advised that she follow up with them for this issue.

## 2015-03-16 ENCOUNTER — Telehealth: Payer: Self-pay | Admitting: *Deleted

## 2015-03-16 ENCOUNTER — Other Ambulatory Visit (INDEPENDENT_AMBULATORY_CARE_PROVIDER_SITE_OTHER): Payer: Medicare Other

## 2015-03-16 DIAGNOSIS — R600 Localized edema: Secondary | ICD-10-CM | POA: Diagnosis not present

## 2015-03-16 LAB — COMPREHENSIVE METABOLIC PANEL
ALBUMIN: 4.2 g/dL (ref 3.5–5.2)
ALK PHOS: 61 U/L (ref 39–117)
ALT: 14 U/L (ref 0–35)
AST: 16 U/L (ref 0–37)
BILIRUBIN TOTAL: 0.3 mg/dL (ref 0.2–1.2)
BUN: 30 mg/dL — ABNORMAL HIGH (ref 6–23)
CALCIUM: 10 mg/dL (ref 8.4–10.5)
CO2: 29 meq/L (ref 19–32)
CREATININE: 1.45 mg/dL — AB (ref 0.40–1.20)
Chloride: 99 mEq/L (ref 96–112)
GFR: 36.62 mL/min — ABNORMAL LOW (ref >60.00)
Glucose, Bld: 138 mg/dL — ABNORMAL HIGH (ref 70–99)
Potassium: 4.4 mEq/L (ref 3.5–5.1)
Sodium: 138 mEq/L (ref 135–145)
TOTAL PROTEIN: 7.5 g/dL (ref 6.0–8.3)

## 2015-03-16 LAB — URINALYSIS, MICROSCOPIC ONLY
RBC / HPF: NONE SEEN (ref 0–?)
WBC, UA: NONE SEEN (ref 0–?)

## 2015-03-16 LAB — CBC
HEMATOCRIT: 35 % — AB (ref 36.0–46.0)
HEMOGLOBIN: 11.8 g/dL — AB (ref 12.0–15.0)
MCHC: 33.7 g/dL (ref 30.0–36.0)
MCV: 93 fl (ref 78.0–100.0)
PLATELETS: 267 10*3/uL (ref 150.0–400.0)
RBC: 3.76 Mil/uL — AB (ref 3.87–5.11)
RDW: 13.5 % (ref 11.5–15.5)
WBC: 7.4 10*3/uL (ref 4.0–10.5)

## 2015-03-16 LAB — TSH: TSH: 2.6 u[IU]/mL (ref 0.35–4.50)

## 2015-03-16 NOTE — Telephone Encounter (Signed)
Called and notified pt of Dr. Ellen Henri message, pt's daughter verbalized understanding and will be taking her to ED immediately .Carol Ada

## 2015-03-16 NOTE — Telephone Encounter (Signed)
Patient needs to be reevaluated today. She did not have abdominal pain when I saw her yesterday. The abdominal pain in combination with the bleeding she had yesterday means evaluation at an urgent care or ED tonight. Please call the patient and inform her of this. Thanks.

## 2015-03-16 NOTE — Telephone Encounter (Signed)
Patient was seen by Dr. Caryl Bis on 03/15/15. She's having Lower abdominal pain. She has urinated once in 24 hours. She's drinking fluids. Patient daughter wanted the antibiotic that was offered at the visit 03/15/15.She also would like a return call to discuss her mother's urination issue. Please contact [name] [phone]

## 2015-03-17 ENCOUNTER — Telehealth: Payer: Self-pay | Admitting: *Deleted

## 2015-03-17 NOTE — Telephone Encounter (Signed)
Patients daughter requested lab results be faxed over to Michaelle Copas at urology at [facility] [phone]. [facility] would like a note from Dr. Caryl Bis stating that he has taken metformin due to kidney function, Please fax this over to [phone] along with lab results.  Please contact patients daughter at [phone]

## 2015-03-17 NOTE — Telephone Encounter (Signed)
[facility] needs a note stating why the Pt was taken off of the Metformin. I can fax over the labs Please advise./tvw

## 2015-03-17 NOTE — Telephone Encounter (Signed)
Patient had lab work that revealed a GFR of 36 which drops her below the cutoff of 45 for metformin, thus I asked her to discontinue this.

## 2015-03-18 ENCOUNTER — Telehealth: Payer: Self-pay | Admitting: Family Medicine

## 2015-03-18 LAB — URINE CULTURE: Colony Count: 45000

## 2015-03-18 NOTE — Telephone Encounter (Signed)
Abigail Boyd [id number] called from [place] regarding needing a verbal order for her frequency twice a week for seven weeks. Thank you!

## 2015-03-18 NOTE — Telephone Encounter (Signed)
Pt information was sent to [facility] 03/18/15./tvw

## 2015-03-18 NOTE — Telephone Encounter (Signed)
[id number]./tvw 

## 2015-03-19 ENCOUNTER — Encounter: Payer: Self-pay | Admitting: Family Medicine

## 2015-03-19 ENCOUNTER — Ambulatory Visit (INDEPENDENT_AMBULATORY_CARE_PROVIDER_SITE_OTHER): Payer: Medicare Other | Admitting: Family Medicine

## 2015-03-19 VITALS — BP 142/72 | HR 71 | Temp 98.6°F | Ht 64.0 in | Wt 174.0 lb

## 2015-03-19 DIAGNOSIS — N183 Chronic kidney disease, stage 3 unspecified: Secondary | ICD-10-CM

## 2015-03-19 DIAGNOSIS — K5900 Constipation, unspecified: Secondary | ICD-10-CM | POA: Diagnosis not present

## 2015-03-19 DIAGNOSIS — N182 Chronic kidney disease, stage 2 (mild): Secondary | ICD-10-CM | POA: Diagnosis not present

## 2015-03-19 DIAGNOSIS — Z794 Long term (current) use of insulin: Secondary | ICD-10-CM

## 2015-03-19 DIAGNOSIS — E1122 Type 2 diabetes mellitus with diabetic chronic kidney disease: Secondary | ICD-10-CM | POA: Diagnosis not present

## 2015-03-19 LAB — BASIC METABOLIC PANEL
BUN: 25 mg/dL — ABNORMAL HIGH (ref 6–23)
CHLORIDE: 98 meq/L (ref 96–112)
CO2: 30 mEq/L (ref 19–32)
CREATININE: 1.12 mg/dL (ref 0.40–1.20)
Calcium: 9.8 mg/dL (ref 8.4–10.5)
GFR: 49.33 mL/min — ABNORMAL LOW (ref >60.00)
Glucose, Bld: 200 mg/dL — ABNORMAL HIGH (ref 70–99)
POTASSIUM: 4.5 meq/L (ref 3.5–5.1)
Sodium: 137 mEq/L (ref 135–145)

## 2015-03-19 NOTE — Progress Notes (Signed)
Patient ID: Abigail Boyd, female   DOB: February 15, 1930, 82 y.o.   MRN: [id number]  Tommi Rumps, MD Phone: [phone]  Abigail Boyd is a 82 y.o. female who presents today for follow-up.  Patient was seen earlier this week. She was noted to have a decreased GFR compared previously. She was advised to stop her metformin at that time. She is following up today for repeat lab work and follow-up on diabetes and constipation.  DIABETES Disease Monitoring: Blood Sugar ranges-100-200 Polyuria/phagia/dipsia- no      Visual problems- no Medications: Compliance- taking Lantus 40 units nightly, Humalog 1 unit if over 200 prior to eating, Januvia Hypoglycemic symptoms- no  Chronic kidney disease: Patient noted have decreased GFR to 36 earlier this week. Her metformin was stopped at that time. She takes Lasix 40 mg daily. This was started recently due to fluid in her legs. She notes she is urinating well with no issues at this time. No hesitancy. She states she has been staying well hydrated.  Constipation: Patient notes this is much improved after starting MiraLAX. She has had several good bowel movements. She had some mild bleeding following one of the bowel movements that she states is related to hemorrhoids. She is not having any lightheadedness, chest pain, shortness of breath, palpitations, or fevers. No abdominal pain at this time. Denies blood in her stool this time.   PMH: nonsmoker.   ROS see history of present illness  Objective  Physical Exam Filed Vitals:   03/19/15 1353  BP: 142/72  Pulse: 71  Temp: 98.6 F (37 C)    Physical Exam  Constitutional: She is well-developed, well-nourished, and in no distress.  HENT:  Head: Normocephalic and atraumatic.  Cardiovascular: Normal rate, regular rhythm and normal heart sounds.  Exam reveals no gallop and no friction rub.   No murmur heard. Trace edema lower extremity  Pulmonary/Chest: Effort normal and breath sounds normal. No  respiratory distress. She has no wheezes. She has no rales.  Abdominal: Soft. Bowel sounds are normal. She exhibits no distension. There is no tenderness. There is no rebound and no guarding.  Genitourinary:  Nonthrombosed external hemorrhoids noted with no bleeding, normal rectal tone, no internal masses palpated on rectal exam, negative FOBT  Neurological: She is alert.  Skin: Skin is warm and dry. She is not diaphoretic.     Assessment/Plan: Please see individual problem list.  CKD (chronic kidney disease), stage II Patient with chronic kidney disease. Had worsening GFR in to the stage III range. Question is whether or not this is an acute change related to dehydration, her Lasix use, or some other cause. We will recheck a BMP today. If still elevated would consider backing off on Lasix to see if this would help.  Constipation Improved after MiraLAX addition. No more blood in her stool. Suspect blood was from hemorrhoids. FOBT was negative today. Patient opted to continue to monitor this. If has recurrent bleeding we would need to consider further workup. Benign abdominal exam. Continue current bowel regimen.  Diabetes mellitus ([place]) Patient reports sugars relatively well controlled in the 100-200 range. She has been off of metformin for about a day and had a single reading of 200 for which she had to take a unit of Humalog. We will keep her off metformin at this time and recheck a BMP. If her GFR returns the normal range we will have a discussion with patient and daughter regarding possibly starting back on metformin versus continuing to monitor  blood sugars and consider going up on Lantus. They will continue to check blood sugars. They're given parameters to call with.    Orders Placed This Encounter  Procedures  . Basic Metabolic Panel (BMET)     Tommi Rumps

## 2015-03-19 NOTE — Patient Instructions (Signed)
Nice to see you. Please continue to monitor blood sugars. If they are persistently running over 200 please let us know. If they get to the level of being over 350 please call us and let us know. Please continue your insulin a as youe have been. We will recheck  yourkidney function today. Please continue the MiraLAX. Please monitor for further bleeding from her rectum. If you develop lightheadedness, chest pain, shortness of breath, palpitations, or any new or change in symptoms please seek medical attention.  If you develop confusion, nausea, vomiting, abdominal pain, or any new or changing in symptoms please seek medical attention.

## 2015-03-19 NOTE — Progress Notes (Signed)
Pre visit review using our clinic review tool, if applicable. No additional management support is needed unless otherwise documented below in the visit note. 

## 2015-03-20 ENCOUNTER — Telehealth: Payer: Self-pay | Admitting: Family Medicine

## 2015-03-20 NOTE — Assessment & Plan Note (Signed)
Patient with chronic kidney disease. Had worsening GFR in to the stage III range. Question is whether or not this is an acute change related to dehydration, her Lasix use, or some other cause. We will recheck a BMP today. If still elevated would consider backing off on Lasix to see if this would help.

## 2015-03-20 NOTE — Assessment & Plan Note (Signed)
Patient reports sugars relatively well controlled in the 100-200 range. She has been off of metformin for about a day and had a single reading of 200 for which she had to take a unit of Humalog. We will keep her off metformin at this time and recheck a BMP. If her GFR returns the normal range we will have a discussion with patient and daughter regarding possibly starting back on metformin versus continuing to monitor blood sugars and consider going up on Lantus. They will continue to check blood sugars. They're given parameters to call with.

## 2015-03-20 NOTE — Telephone Encounter (Signed)
Called and spoke to patient and her daughter regarding her kidney function. Her GFR is improved to 49. Possibly the decrease was related to dehydration. Discussed her diabetic regimen. With her GFR having improved to above 45 I discussed that her GFR is back and arrange that is appropriate for metformin and this could potentially be restarted. I also discussed that we could continue to monitor her blood sugars and if they start to become elevated consider increasing her Lantus. Given her age and her borderline GFR we opted to continue to monitor blood sugars over the weekend prior to making a final decision on metformin. They do note they checked her blood sugar yesterday after she ate and it was in the low 300s and they gave her 2 units of insulin and it returned to below 200. The daughter noted that she would keep a close eye on it today and check her sugars prior to eating to ensure a more accurate reading. They understand the sliding scale insulin that they've been using. They'll call on Monday with the results of her sugars from over the weekend. I advised that if her sugars are persistently in the 300s over the weekend that they should call our on-call line. They voiced understanding.

## 2015-03-20 NOTE — Assessment & Plan Note (Signed)
Improved after MiraLAX addition. No more blood in her stool. Suspect blood was from hemorrhoids. FOBT was negative today. Patient opted to continue to monitor this. If has recurrent bleeding we would need to consider further workup. Benign abdominal exam. Continue current bowel regimen.

## 2015-03-22 ENCOUNTER — Ambulatory Visit: Payer: Medicare Other | Admitting: Family Medicine

## 2015-03-24 ENCOUNTER — Telehealth: Payer: Self-pay | Admitting: Family Medicine

## 2015-03-24 ENCOUNTER — Ambulatory Visit: Payer: Medicare Other | Admitting: Family Medicine

## 2015-03-24 NOTE — Telephone Encounter (Signed)
Pt needs orders for physical and speech therapy. Please advise

## 2015-03-24 NOTE — Telephone Encounter (Signed)
Abigail Boyd, from [place], they are doing Pt's therapy. Abigail Boyd needs some orders signed off on physical therapy and speech therapy, was wondering if Dr. Caryl Bis would do. Please call Abigail Boyd at [phone].

## 2015-03-25 NOTE — Telephone Encounter (Signed)
Received faxed orders. I have signed them.

## 2015-03-29 ENCOUNTER — Telehealth: Payer: Self-pay

## 2015-03-29 NOTE — Telephone Encounter (Signed)
Occupational health wants verbal order to increase therapy to 2 times a week for 6 weeks. Pt is still working towards independance of daily living acivities. Please advise

## 2015-03-29 NOTE — Telephone Encounter (Signed)
Verbal order can be given.  

## 2015-03-31 DIAGNOSIS — E119 Type 2 diabetes mellitus without complications: Secondary | ICD-10-CM | POA: Diagnosis not present

## 2015-03-31 DIAGNOSIS — Z794 Long term (current) use of insulin: Secondary | ICD-10-CM | POA: Diagnosis not present

## 2015-04-01 DIAGNOSIS — I69351 Hemiplegia and hemiparesis following cerebral infarction affecting right dominant side: Secondary | ICD-10-CM | POA: Diagnosis not present

## 2015-04-01 DIAGNOSIS — M25561 Pain in right knee: Secondary | ICD-10-CM | POA: Diagnosis not present

## 2015-04-01 DIAGNOSIS — E1122 Type 2 diabetes mellitus with diabetic chronic kidney disease: Secondary | ICD-10-CM | POA: Diagnosis not present

## 2015-04-01 DIAGNOSIS — I129 Hypertensive chronic kidney disease with stage 1 through stage 4 chronic kidney disease, or unspecified chronic kidney disease: Secondary | ICD-10-CM | POA: Diagnosis not present

## 2015-04-05 DIAGNOSIS — M5412 Radiculopathy, cervical region: Secondary | ICD-10-CM | POA: Diagnosis not present

## 2015-04-05 DIAGNOSIS — E1122 Type 2 diabetes mellitus with diabetic chronic kidney disease: Secondary | ICD-10-CM | POA: Diagnosis not present

## 2015-04-05 DIAGNOSIS — I129 Hypertensive chronic kidney disease with stage 1 through stage 4 chronic kidney disease, or unspecified chronic kidney disease: Secondary | ICD-10-CM | POA: Diagnosis not present

## 2015-04-05 DIAGNOSIS — M9902 Segmental and somatic dysfunction of thoracic region: Secondary | ICD-10-CM | POA: Diagnosis not present

## 2015-04-05 DIAGNOSIS — M9901 Segmental and somatic dysfunction of cervical region: Secondary | ICD-10-CM | POA: Diagnosis not present

## 2015-04-05 DIAGNOSIS — I69351 Hemiplegia and hemiparesis following cerebral infarction affecting right dominant side: Secondary | ICD-10-CM | POA: Diagnosis not present

## 2015-04-05 DIAGNOSIS — M25561 Pain in right knee: Secondary | ICD-10-CM | POA: Diagnosis not present

## 2015-04-05 DIAGNOSIS — M62830 Muscle spasm of back: Secondary | ICD-10-CM | POA: Diagnosis not present

## 2015-04-06 DIAGNOSIS — I129 Hypertensive chronic kidney disease with stage 1 through stage 4 chronic kidney disease, or unspecified chronic kidney disease: Secondary | ICD-10-CM | POA: Diagnosis not present

## 2015-04-06 DIAGNOSIS — E1122 Type 2 diabetes mellitus with diabetic chronic kidney disease: Secondary | ICD-10-CM | POA: Diagnosis not present

## 2015-04-06 DIAGNOSIS — I69351 Hemiplegia and hemiparesis following cerebral infarction affecting right dominant side: Secondary | ICD-10-CM | POA: Diagnosis not present

## 2015-04-06 DIAGNOSIS — M25561 Pain in right knee: Secondary | ICD-10-CM | POA: Diagnosis not present

## 2015-04-07 DIAGNOSIS — I69351 Hemiplegia and hemiparesis following cerebral infarction affecting right dominant side: Secondary | ICD-10-CM | POA: Diagnosis not present

## 2015-04-07 DIAGNOSIS — M5412 Radiculopathy, cervical region: Secondary | ICD-10-CM | POA: Diagnosis not present

## 2015-04-07 DIAGNOSIS — I129 Hypertensive chronic kidney disease with stage 1 through stage 4 chronic kidney disease, or unspecified chronic kidney disease: Secondary | ICD-10-CM | POA: Diagnosis not present

## 2015-04-07 DIAGNOSIS — M62830 Muscle spasm of back: Secondary | ICD-10-CM | POA: Diagnosis not present

## 2015-04-07 DIAGNOSIS — E1122 Type 2 diabetes mellitus with diabetic chronic kidney disease: Secondary | ICD-10-CM | POA: Diagnosis not present

## 2015-04-07 DIAGNOSIS — M25561 Pain in right knee: Secondary | ICD-10-CM | POA: Diagnosis not present

## 2015-04-07 DIAGNOSIS — M9901 Segmental and somatic dysfunction of cervical region: Secondary | ICD-10-CM | POA: Diagnosis not present

## 2015-04-07 DIAGNOSIS — M9902 Segmental and somatic dysfunction of thoracic region: Secondary | ICD-10-CM | POA: Diagnosis not present

## 2015-04-08 DIAGNOSIS — I129 Hypertensive chronic kidney disease with stage 1 through stage 4 chronic kidney disease, or unspecified chronic kidney disease: Secondary | ICD-10-CM | POA: Diagnosis not present

## 2015-04-08 DIAGNOSIS — E1122 Type 2 diabetes mellitus with diabetic chronic kidney disease: Secondary | ICD-10-CM | POA: Diagnosis not present

## 2015-04-08 DIAGNOSIS — I69351 Hemiplegia and hemiparesis following cerebral infarction affecting right dominant side: Secondary | ICD-10-CM | POA: Diagnosis not present

## 2015-04-08 DIAGNOSIS — M25561 Pain in right knee: Secondary | ICD-10-CM | POA: Diagnosis not present

## 2015-04-09 DIAGNOSIS — H93293 Other abnormal auditory perceptions, bilateral: Secondary | ICD-10-CM | POA: Diagnosis not present

## 2015-04-09 DIAGNOSIS — H903 Sensorineural hearing loss, bilateral: Secondary | ICD-10-CM | POA: Diagnosis not present

## 2015-04-09 DIAGNOSIS — H6123 Impacted cerumen, bilateral: Secondary | ICD-10-CM | POA: Diagnosis not present

## 2015-04-12 ENCOUNTER — Encounter: Payer: Self-pay | Admitting: Neurology

## 2015-04-12 ENCOUNTER — Ambulatory Visit (INDEPENDENT_AMBULATORY_CARE_PROVIDER_SITE_OTHER): Payer: Medicare Other | Admitting: Neurology

## 2015-04-12 VITALS — BP 142/62 | HR 65 | Ht 64.0 in

## 2015-04-12 DIAGNOSIS — I639 Cerebral infarction, unspecified: Secondary | ICD-10-CM

## 2015-04-12 DIAGNOSIS — M25561 Pain in right knee: Secondary | ICD-10-CM | POA: Diagnosis not present

## 2015-04-12 DIAGNOSIS — I129 Hypertensive chronic kidney disease with stage 1 through stage 4 chronic kidney disease, or unspecified chronic kidney disease: Secondary | ICD-10-CM | POA: Diagnosis not present

## 2015-04-12 DIAGNOSIS — I69351 Hemiplegia and hemiparesis following cerebral infarction affecting right dominant side: Secondary | ICD-10-CM | POA: Diagnosis not present

## 2015-04-12 DIAGNOSIS — E1122 Type 2 diabetes mellitus with diabetic chronic kidney disease: Secondary | ICD-10-CM | POA: Diagnosis not present

## 2015-04-12 NOTE — Progress Notes (Signed)
[facility] [address] [place]. [place] [address] [phone]       OFFICE FOLLOW-UP NOTE  [name] Date of Birth:  06-08-1929 Medical Record Number:  [id number]   HPI: 22 year caucasian lady who is seen today for first office follow-up visit for hospital admission for stroke in November 2016. Abigail Boyd is an 82 y.o. female who was talking to her nephew mid-week and he noticed that her speech was slurred. She is unclear when this started but her daughter recalls talking to her the night of 12/22/14 at which time her speech was normal. Later in the day noticed numbness in her right hand. On th next day dropped a coffee pot that she was trying to hold with her right hand. Has also noticed that she was not moving her right leg as well. She did have some previous knee problems that limited her but feels things are worse. She did not want to come to the hospital but was encouraged by family to come today.  Date last known well: Date: 12/22/2014 Time last known well: Time: 23:00 tPA Given: No: Outside time window.  CT scan of the head on admission showed a low-density in the left supratentorial brain and subcortical white matter suggestive of an infarct and this was confirmed on MRI which showed a 10 x 15 mm left frontal cortical and subcortical infarct. MRA of the brain showed no large vessel intracranial stenosis or occlusion. Carotid Doppler showed no extracranial stenosis. Transthoracic echo showed normal ejection fraction. Patient was on warfarin and INR on admission was optimal at 2.53. LDL cholesterol was 98 mg percent and hemoglobin A1c was 8.4. Patient was seen by physical occupational speech therapy and warfarin was changed to Xarelto for secondary stroke prevention. Patient states she's done well since discharge and she's had no bleeding or bruising. She is tolerating Xarelto well. She is living at home with her daughter. She has 24-hour care at home. She is  walking with physical and occupational therapies using a walker. She feels her right knee pain is the most limitation for walking. ROS:   14 system review of systems is positive for leg swelling, cough, incontinence, constipation, urination problems, joint pain, runny nose, memory loss, weakness, sleepiness, restless legs and all other systems negative  PMH:  Past Medical History  Diagnosis Date  . History of unstable angina 06/13/2011    Jaw pain awakening from sleep -- Myoview --CATH --> PCI  . Abnormal nuclear stress test 06/21/2011    Inferolateral reversible defect;--> cardiac cath & PCI of CxOM, occluded RCA with collaterals;; followup Myoview 11/2012: Low risk. Fixed basal inferior artifact normal EF. No ischemia  . CAD S/P percutaneous coronary angioplasty     s/P PCI to proximal OM1 w/ DES; occluded RCA with bridging and L-R collaterals (medical management)  . Shortness of breath on exertion October 2013    2-D echo: Normal EF>55%, Gr 1DD, mild aortic sclerosis; Evaluated with CPET - peak VO2 97%; Chronotropic Incompetence (submaximal effort)  . Chronotropic incompetence with sinus node dysfunction [facility]) October 2013    On CPET test; beta blockers reduced  . PAF (paroxysmal atrial fibrillation) ([place]) 10-11/ 2014    CardioNet Event Monitor: NSR & S Brady -- Rates 50-100; Total A. fib burden 35 hours and 27 minutes. 1296 episodes, longest was 1 hour 29 minutes. Rate ranged from 52-169 beats per minute.  . Tachycardia-bradycardia syndrome ([place]) 11/2012  . History of syncope  Per EP - neurocardiogenic & not Bradycardia related (no PPM)  . OSA (obstructive sleep apnea)     hx bladder infections  . Diabetes mellitus type 2, controlled ([place])     On oral medications  . Essential hypertension     Allowing for permissive hypertension to avoid orthostatic hypotension  . Hyperlipidemia with target LDL less than 70     HDL at goal, LDL not at goal, borderline triglycerides. On Crestor 20  mg  . Chronic kidney disease (CKD), stage II (mild)     Related to current bladder infections (although diabetes cannot be excluded)  . Spinal stenosis of lumbar region 11/2011  . Osteoarthritis   . GERD (gastroesophageal reflux disease)     On PPI  . Seasonal allergies   . Anxiety     When necessary Xanax  . Glaucoma   . Bilateral cataracts     Status post stroke or correction  . Diverticulitis   . Migraines   . Stroke ([place])   . Urge incontinence     Social History:  Social History   Social History  . Marital Status: Widowed    Spouse Name: N/A  . Number of Children: N/A  . Years of Education: N/A   Occupational History  . retired    Social History Main Topics  . Smoking status: Never Smoker   . Smokeless tobacco: Never Used  . Alcohol Use: No  . Drug Use: No  . Sexual Activity: No   Other Topics Concern  . Not on file   Social History Narrative   Widowed mother of one, grandmother 2. Has not been using silver take as much lately due to knee discomfort. Usually exercises with Silver Sneakers on regular basis.    Medications:   Current Outpatient Prescriptions on File Prior to Visit  Medication Sig Dispense Refill  . acetaminophen (TYLENOL) 650 MG CR tablet Take 1,300 mg by mouth 2 (two) times daily as needed for pain.    Marland Kitchen ALPRAZolam (XANAX) 0.25 MG tablet Take 1 tablet (0.25 mg total) by mouth at bedtime as needed for sleep or anxiety. 30 tablet 0  . AZELASTINE HCL NA Place 1 spray into both nostrils daily.     Marland Kitchen BACLOFEN PO Take 5mg  by mouth daily for muscle spasm    . cholecalciferol (VITAMIN D) 1000 UNITS tablet Take 1,000 Units by mouth daily.    . fexofenadine (ALLEGRA) 180 MG tablet Take 180 mg by mouth at bedtime.     . fish oil-omega-3 fatty acids 1000 MG capsule Take 1 g by mouth daily.     . furosemide (LASIX) 20 MG tablet Take 1 tablet (20 mg total) by mouth daily as needed. Once daily as needed for swelling 30 tablet 3  . insulin lispro (HUMALOG  KWIKPEN) 100 UNIT/ML KiwkPen Inject 1 Units into the skin 3 (three) times daily as needed (CBG >200).    Marland Kitchen ipratropium (ATROVENT) 0.06 % nasal spray Place 1 spray into the nose daily.     Marland Kitchen latanoprost (XALATAN) 0.005 % ophthalmic solution Instill one drop into each eye every night at bedtime    . Magnesium 500 MG CAPS Take 500 mg by mouth daily as needed (constipation).     . metoprolol tartrate (LOPRESSOR) 25 MG tablet Take 1 tablet (25 mg total) by mouth 2 (two) times daily. HOLD for SBP <110 and HR < 60    . mirabegron ER (MYRBETRIQ) 50 MG TB24 tablet Take one tablet by mouth  once daily for kidneys    . nitroGLYCERIN (NITROSTAT) 0.4 MG SL tablet Place 0.4 mg under the tongue every 5 (five) minutes as needed for chest pain.    Marland Kitchen nystatin cream (MYCOSTATIN) Apply 1 application topically daily.  1  . senna-docusate (SENOKOT-S) 8.6-50 MG tablet Take 1 tablet by mouth at bedtime for constipation    . sitaGLIPtin (JANUVIA) 50 MG tablet Take 50 mg by mouth at bedtime.      No current facility-administered medications on file prior to visit.    Allergies:   Allergies  Allergen Reactions  . Lisinopril Cough  . Losartan Cough  . Sulfa Antibiotics Hives    Physical Exam General: well developed, well nourished elderly Caucasian lady, seated in a wheelchair, in no evident distress Head: head normocephalic and atraumatic.  Neck: supple with no carotid or supraclavicular bruits Cardiovascular: regular rate and rhythm, no murmurs Musculoskeletal: no deformity Skin:  no rash/petichiae Vascular:  Normal pulses all extremities Filed Vitals:   04/12/15 1601  BP: 142/62  Pulse: 65   Neurologic Exam Mental Status: Awake and fully alert. Oriented to place and time. Recent and remote memory intact. Attention span, concentration and fund of knowledge appropriate. Mood and affect appropriate. Speech is mostly fluent without occasional word finding difficulties and hesitancy. Cranial Nerves:  Fundoscopic exam reveals sharp disc margins. Pupils equal, briskly reactive to light. Extraocular movements full without nystagmus. Visual fields full to confrontation. Hearing intact. Facial sensation intact. Face, tongue, palate moves normally and symmetrically.  Motor: Normal bulk and tone. Normal strength in all tested extremity muscles. Sensory.: intact to touch ,pinprick .position and vibratory sensation.  Coordination: Rapid alternating movements normal in all extremities. Finger-to-nose and heel-to-shin performed accurately bilaterally. Gait and Station: Arises from chair with slight difficulty. Stance is normal. Gait demonstrates normal stride length and balance but favors right knee due to pain.  Reflexes: 1+ and symmetric. Toes downgoing.   NIHSS 0 Modified Rankin  1   ASSESSMENT: 14 year lady with embolic left MCA branch infarct secondary to atrial fibrillation in November 2016 despite optimal anticoagulation with warfarin. She has been changed to Xarelto and is doing well.    PLAN: I had a long d/w patient and sister about her recent stroke, risk for recurrent stroke/TIAs, personally independently reviewed imaging studies and stroke evaluation results and answered questions.Continue Xarelto for atrial fibrillation  for secondary stroke prevention and maintain strict control of hypertension with blood pressure goal below 130/90, diabetes with hemoglobin A1c goal below 6.5% and lipids with LDL cholesterol goal below 70 mg/dL. I also advised the patient to eat a healthy diet with plenty of whole grains, cereals, fruits and vegetables, exercise regularly and maintain ideal body weight .I encouraged her to continue ongoing home physical and occupational therapy for gait and balance training Greater than 50% of time during this 25 minute visit was spent on counseling,explanation of diagnosis, planning of further management, discussion with patient and family and coordination of care .Followup  in the future with me in  6 months or call earlier if necessary.  Antony Contras, MD  Note: This document was prepared with digital dictation and possible smart phrase technology. Any transcriptional errors that result from this process are unintentional

## 2015-04-12 NOTE — Patient Instructions (Signed)
I had a long d/w patient and sister about her recent stroke, risk for recurrent stroke/TIAs, personally independently reviewed imaging studies and stroke evaluation results and answered questions.Continue Xarelto for atrial fibrillation  for secondary stroke prevention and maintain strict control of hypertension with blood pressure goal below 130/90, diabetes with hemoglobin A1c goal below 6.5% and lipids with LDL cholesterol goal below 70 mg/dL. I also advised the patient to eat a healthy diet with plenty of whole grains, cereals, fruits and vegetables, exercise regularly and maintain ideal body weight .I encouraged her to continue ongoing home physical and occupational therapy for gait and balance training Followup in the future with me in  6 months or call earlier if necessary.  Stroke Prevention Some medical conditions and behaviors are associated with an increased chance of having a stroke. You may prevent a stroke by making healthy choices and managing medical conditions. HOW CAN I REDUCE MY RISK OF HAVING A STROKE?   Stay physically active. Get at least 30 minutes of activity on most or all days.  Do not smoke. It may also be helpful to avoid exposure to secondhand smoke.  Limit alcohol use. Moderate alcohol use is considered to be:  No more than 2 drinks per day for men.  No more than 1 drink per day for nonpregnant women.  Eat healthy foods. This involves:  Eating 5 or more servings of fruits and vegetables a day.  Making dietary changes that address high blood pressure (hypertension), high cholesterol, diabetes, or obesity.  Manage your cholesterol levels.  Making food choices that are high in fiber and low in saturated fat, trans fat, and cholesterol may control cholesterol levels.  Take any prescribed medicines to control cholesterol as directed by your health care provider.  Manage your diabetes.  Controlling your carbohydrate and sugar intake is recommended to manage  diabetes.  Take any prescribed medicines to control diabetes as directed by your health care provider.  Control your hypertension.  Making food choices that are low in salt (sodium), saturated fat, trans fat, and cholesterol is recommended to manage hypertension.  Ask your health care provider if you need treatment to lower your blood pressure. Take any prescribed medicines to control hypertension as directed by your health care provider.  If you are 55-32 years of age, have your blood pressure checked every 3-5 years. If you are 68 years of age or older, have your blood pressure checked every year.  Maintain a healthy weight.  Reducing calorie intake and making food choices that are low in sodium, saturated fat, trans fat, and cholesterol are recommended to manage weight.  Stop drug abuse.  Avoid taking birth control pills.  Talk to your health care provider about the risks of taking birth control pills if you are over 14 years old, smoke, get migraines, or have ever had a blood clot.  Get evaluated for sleep disorders (sleep apnea).  Talk to your health care provider about getting a sleep evaluation if you snore a lot or have excessive sleepiness.  Take medicines only as directed by your health care provider.  For some people, aspirin or blood thinners (anticoagulants) are helpful in reducing the risk of forming abnormal blood clots that can lead to stroke. If you have the irregular heart rhythm of atrial fibrillation, you should be on a blood thinner unless there is a good reason you cannot take them.  Understand all your medicine instructions.  Make sure that other conditions (such as anemia or atherosclerosis)  are addressed. SEEK IMMEDIATE MEDICAL CARE IF:   You have sudden weakness or numbness of the face, arm, or leg, especially on one side of the body.  Your face or eyelid droops to one side.  You have sudden confusion.  You have trouble speaking (aphasia) or  understanding.  You have sudden trouble seeing in one or both eyes.  You have sudden trouble walking.  You have dizziness.  You have a loss of balance or coordination.  You have a sudden, severe headache with no known cause.  You have new chest pain or an irregular heartbeat. Any of these symptoms may represent a serious problem that is an emergency. Do not wait to see if the symptoms will go away. Get medical help at once. Call your local emergency services (911 in [place] Do not drive yourself to the hospital.   This information is not intended to replace advice given to you by your health care provider. Make sure you discuss any questions you have with your health care provider.   Document Released: 03/23/2004 Document Revised: 03/06/2014 Document Reviewed: 08/16/2012 Elsevier Interactive Patient Education [employer].

## 2015-04-13 ENCOUNTER — Telehealth: Payer: Self-pay

## 2015-04-13 DIAGNOSIS — R600 Localized edema: Secondary | ICD-10-CM

## 2015-04-13 DIAGNOSIS — I4589 Other specified conduction disorders: Secondary | ICD-10-CM

## 2015-04-13 DIAGNOSIS — R0609 Other forms of dyspnea: Secondary | ICD-10-CM

## 2015-04-13 DIAGNOSIS — Z79899 Other long term (current) drug therapy: Secondary | ICD-10-CM

## 2015-04-13 NOTE — Telephone Encounter (Signed)
Pt daughter is calling, states her rx for Metoprolol  Was changed in the hospital. Please call regarding doseage.

## 2015-04-13 NOTE — Telephone Encounter (Signed)
Pt sees Dr. Ellyn Hack at [facility]. They have been advising on this situation therefore, I have forwarded call to Trixie Dredge, RN

## 2015-04-13 NOTE — Telephone Encounter (Signed)
Daughter also states pt has swelling Pt c/o swelling: STAT is pt has developed SOB within 24 hours  1. How long have you been experiencing swelling? Since stroke on 10/23. States medication has not helped  2. Where is the swelling located? Bilateral legs, feet and ankles, (below knees)  3.  Are you currently taking a "fluid pill"? yes  4.  Are you currently SOB? no  5.  Have you traveled recently?no

## 2015-04-14 ENCOUNTER — Ambulatory Visit (INDEPENDENT_AMBULATORY_CARE_PROVIDER_SITE_OTHER): Payer: Medicare Other | Admitting: Family Medicine

## 2015-04-14 ENCOUNTER — Encounter: Payer: Self-pay | Admitting: Family Medicine

## 2015-04-14 ENCOUNTER — Telehealth: Payer: Self-pay | Admitting: Family Medicine

## 2015-04-14 VITALS — BP 132/68 | HR 73 | Temp 98.4°F | Wt 180.0 lb

## 2015-04-14 DIAGNOSIS — E1122 Type 2 diabetes mellitus with diabetic chronic kidney disease: Secondary | ICD-10-CM | POA: Diagnosis not present

## 2015-04-14 DIAGNOSIS — R600 Localized edema: Secondary | ICD-10-CM

## 2015-04-14 DIAGNOSIS — N182 Chronic kidney disease, stage 2 (mild): Secondary | ICD-10-CM

## 2015-04-14 DIAGNOSIS — Z794 Long term (current) use of insulin: Secondary | ICD-10-CM

## 2015-04-14 DIAGNOSIS — M25561 Pain in right knee: Secondary | ICD-10-CM | POA: Diagnosis not present

## 2015-04-14 LAB — COMPREHENSIVE METABOLIC PANEL
ALBUMIN: 4.2 g/dL (ref 3.5–5.2)
ALK PHOS: 58 U/L (ref 39–117)
ALT: 11 U/L (ref 0–35)
AST: 13 U/L (ref 0–37)
BUN: 35 mg/dL — AB (ref 6–23)
CALCIUM: 9.6 mg/dL (ref 8.4–10.5)
CHLORIDE: 99 meq/L (ref 96–112)
CO2: 28 mEq/L (ref 19–32)
Creatinine, Ser: 1.25 mg/dL — ABNORMAL HIGH (ref 0.40–1.20)
GFR: 43.45 mL/min — AB (ref >60.00)
Glucose, Bld: 317 mg/dL — ABNORMAL HIGH (ref 70–99)
POTASSIUM: 4.9 meq/L (ref 3.5–5.1)
SODIUM: 135 meq/L (ref 135–145)
TOTAL PROTEIN: 6.8 g/dL (ref 6.0–8.3)
Total Bilirubin: 0.2 mg/dL (ref 0.2–1.2)

## 2015-04-14 LAB — CBC
HEMATOCRIT: 33.7 % — AB (ref 36.0–46.0)
HEMOGLOBIN: 11.2 g/dL — AB (ref 12.0–15.0)
MCHC: 33.2 g/dL (ref 30.0–36.0)
MCV: 93 fl (ref 78.0–100.0)
PLATELETS: 233 10*3/uL (ref 150.0–400.0)
RBC: 3.62 Mil/uL — AB (ref 3.87–5.11)
RDW: 14.1 % (ref 11.5–15.5)
WBC: 7 10*3/uL (ref 4.0–10.5)

## 2015-04-14 MED ORDER — METOPROLOL TARTRATE 25 MG PO TABS
25.0000 mg | ORAL_TABLET | Freq: Two times a day (BID) | ORAL | Status: DC
Start: 2015-04-14 — End: 2015-04-26

## 2015-04-14 NOTE — Telephone Encounter (Signed)
Pt went to [facility] on [address]. They would not refill the prescriptions that she had with Dr. Hardin Negus, who retired. They need Dr. Caryl Bis to call these in and all future refills. Please call Theodoro Clock at [phone], this is pt's daughter.

## 2015-04-14 NOTE — Telephone Encounter (Signed)
Spoke with patients daughter and she is going to request pharmacy to send refill request to our office. Please advise on other message

## 2015-04-14 NOTE — Telephone Encounter (Signed)
Spoke daughter Patient saw primary doctor today. He did refill  Metoprolol 25 mg twice a day  Until patient sees Dr Ellyn Hack in March 2017 at [facility].  Daughter states patient still is experiencing swelling in lower extremities. Patient is taking furosemide 20 mg daily-instead of prn  Per daughter ,patient needs it everyday. - patient is keepping legs elevated while sitting.  Per primary office visit , ( part of epic)weight today 180 lbs , last weight on 03/19/15 was 174. Patient did have labs drawn today at primary  Patient is not weighing daily RN asked daughter , to weigh patient daily if possible and bring reading to office appointment in march 2017 If primary calls with instruction concerning medication follow them, will defer to Dr Ellyn Hack and contact her back Daughter verbalized understanding.

## 2015-04-14 NOTE — Assessment & Plan Note (Signed)
Patient's blood sugars appear to be better controlled than right after coming off her metformin. I reviewed her endocrinologist note it appears that she wanted to have the patient contact her if her blood sugars were consistently greater than 250. I advised the patient and patient's sister to contact the endocrinologist via my chart regarding her blood sugars.

## 2015-04-14 NOTE — Assessment & Plan Note (Addendum)
Continues to have issues with this. Discomfort with walking is prohibiting her from walking. Benign exam. She'll continue physical therapy. Recently saw orthopedics She complains of having to drive so far to go to the orthopedists thus we will refer her to a local orthopedist for further evaluation.

## 2015-04-14 NOTE — Patient Instructions (Signed)
Nice to see you. We will check your kidney function today to ensure that has not worsened. Your swelling to be related to the veins in her legs not working as they used to. She needed to keep her legs propped up. If this persists we may need to consider compression stockings. Please continue physical therapy. Follow up with the orthopedist for your knee pain. I would call your endocrinologist and let them know that your blood sugars have continued to be elevated with the insulin changes. If you develop chest pain, shortness of breath, palpitations, increasing pain, blood sugars greater than 100, or any new or changing symptoms please seek medical attention.

## 2015-04-14 NOTE — Progress Notes (Signed)
Pre visit review using our clinic review tool, if applicable. No additional management support is needed unless otherwise documented below in the visit note. 

## 2015-04-14 NOTE — Telephone Encounter (Signed)
Pt also would like to know if the swelling in both ankles and legs are do to the medicine she is taking for incontinents. Pt's sister forgot to ask this at Pt's appointment. When you speak to daughter please let her know.

## 2015-04-14 NOTE — Telephone Encounter (Signed)
Vesicare can cause swelling in a small number of patients that are on this. We could consider stopping this if her renal function and lab work is normal  Andseeing if the swelling improves.

## 2015-04-14 NOTE — Assessment & Plan Note (Signed)
Patient continues to have lower extremity edema. No CHF symptoms. Has known kidney dysfunction. Prior liver function and thyroid function were normal. Mildly anemic previously. Previously tried Lasix though had not responded to this. Unlikely to be DVT given bilateral nature patient is already on anticoagulation. We will repeat a CMP and CBC to make sure she is not more anemic and make sure her renal function is stable. Suspect likely venous insufficiency worsened by her sitting most of the time. Discussed elevation of legs. Will likely need compression stockings. Given return precautions.

## 2015-04-14 NOTE — Telephone Encounter (Signed)
I would increase Lasix tp 40 mg daily for 3 days then reduce to 20 mg daily until back normal. Would check BNP & BMP in ~1 week.  Leonie Man, MD

## 2015-04-14 NOTE — Progress Notes (Signed)
Patient ID: LORETHA URE, female   DOB: 1929-08-22, 82 y.o.   MRN: [id number]  Tommi Rumps, MD Phone: [phone]  Abigail Boyd is a 82 y.o. female who presents today for follow-up.  Patient notes bilateral lower extremity swelling for several weeks. She's previously been seen for this issue. She has both legs swell up to the midshin. She has no chest pain, shortness of breath, orthopnea, or palpitations with this. Notes she props her legs up and it does improve some. No calf pain. She is on anticoagulation for atrial fibrillation. Last echo had EF of 88-41% with diastolic grade 1 dysfunction.  Right knee pain: Patient notes this is a chronic issue. She's had arthroscopic surgery on the knee previously. Recently saw orthopedics and they told her to do ankle weights to help with this. She's been doing physical therapy. She feels that her knee sometimes buckles. Notes pain only occurs when she is walking on it. She uses a walker to walk. She notes no weakness in her legs. No numbness in her legs. Patient notes the pain in her right knee prohibits her from walking much. She works in physical therapy for this.  Diabetes: Patient notes she saw endocrinology last week. They increased her Lantus to 48 units at night and 7 units of Humalog with meals. Her blood sugars have typically ranged between 190-248 with this change. One single blood sugar of 288. She is not on metformin anymore.  PMH: nonsmoker.   ROS see history of present illness  Objective  Physical Exam Filed Vitals:   04/14/15 1322  BP: 132/68  Pulse: 73  Temp: 98.4 F (36.9 C)    Physical Exam  Constitutional: She is well-developed, well-nourished, and in no distress.  HENT:  Head: Normocephalic and atraumatic.  Cardiovascular: Normal rate, regular rhythm and normal heart sounds.  Exam reveals no gallop and no friction rub.   No murmur heard. 1+ pitting edema to mid shin  Pulmonary/Chest: Effort normal and breath sounds  normal. No respiratory distress. She has no wheezes. She has no rales.  Musculoskeletal:  Bilateral knees with no swelling, joint line tenderness, erythema, or warmth, no ligamentous laxity, negative McMurray's No calf tenderness or cords bilaterally  Neurological: She is alert.  5 out of 5 strength in bilateral quads, hamstrings, plantar flexion, and dorsiflexion, sensation to light touch intact in bilateral lower extremities, 2+ patellar reflexes  Skin: Skin is warm and dry. She is not diaphoretic.     Assessment/Plan: Please see individual problem list.  Bilateral lower extremity edema Patient continues to have lower extremity edema. No CHF symptoms. Has known kidney dysfunction. Prior liver function and thyroid function were normal. Mildly anemic previously. Previously tried Lasix though had not responded to this. Unlikely to be DVT given bilateral nature patient is already on anticoagulation. We will repeat a CMP and CBC to make sure she is not more anemic and make sure her renal function is stable. Suspect likely venous insufficiency worsened by her sitting most of the time. Discussed elevation of legs. Will likely need compression stockings. Given return precautions.  Diabetes mellitus ([place]) Patient's blood sugars appear to be better controlled than right after coming off her metformin. I reviewed her endocrinologist note it appears that she wanted to have the patient contact her if her blood sugars were consistently greater than 250. I advised the patient and patient's sister to contact the endocrinologist via my chart regarding her blood sugars.  Right knee pain Continues to have issues  with this. Discomfort with walking is prohibiting her from walking. Benign exam. She'll continue physical therapy. Recently saw orthopedics She complains of having to drive so far to go to the orthopedists thus we will refer her to a local orthopedist for further evaluation.   patient additionally needed  a refill on her metoprolol for her atrial fibrillation.  Orders Placed This Encounter  Procedures  . Comp Met (CMET)  . CBC  . AMB referral to orthopedics    Referral Priority:  Routine    Referral Type:  Consultation    Number of Visits Requested:  1    Meds ordered this encounter  Medications  . metoprolol tartrate (LOPRESSOR) 25 MG tablet    Sig: Take 1 tablet (25 mg total) by mouth 2 (two) times daily. HOLD for SBP <110 and HR < 60    Dispense:  60 tablet    Refill:  0     Tommi Rumps

## 2015-04-15 DIAGNOSIS — I129 Hypertensive chronic kidney disease with stage 1 through stage 4 chronic kidney disease, or unspecified chronic kidney disease: Secondary | ICD-10-CM | POA: Diagnosis not present

## 2015-04-15 DIAGNOSIS — M25561 Pain in right knee: Secondary | ICD-10-CM | POA: Diagnosis not present

## 2015-04-15 DIAGNOSIS — E1122 Type 2 diabetes mellitus with diabetic chronic kidney disease: Secondary | ICD-10-CM | POA: Diagnosis not present

## 2015-04-15 DIAGNOSIS — I69351 Hemiplegia and hemiparesis following cerebral infarction affecting right dominant side: Secondary | ICD-10-CM | POA: Diagnosis not present

## 2015-04-15 NOTE — Telephone Encounter (Signed)
Notified patients daughter about Dr. Caryl Bis message. Will wait for lab results.

## 2015-04-15 NOTE — Telephone Encounter (Signed)
INFORMATION GIVEN TO DAUGHTER PER DR HARDING ORDERS- CONCERNING FUROSEMIDE  BNP ,BMP IN ONE WEEK  ( ANYTIME BETWEEN 2/23-04/06/15 ) AT [facility]

## 2015-04-15 NOTE — Addendum Note (Signed)
Addended by: Raiford Simmonds on: 04/15/2015 12:39 PM   Modules accepted: Orders

## 2015-04-15 NOTE — Telephone Encounter (Signed)
Left message to call back  

## 2015-04-16 DIAGNOSIS — I69351 Hemiplegia and hemiparesis following cerebral infarction affecting right dominant side: Secondary | ICD-10-CM | POA: Diagnosis not present

## 2015-04-16 DIAGNOSIS — M1711 Unilateral primary osteoarthritis, right knee: Secondary | ICD-10-CM | POA: Diagnosis not present

## 2015-04-16 DIAGNOSIS — M25561 Pain in right knee: Secondary | ICD-10-CM | POA: Diagnosis not present

## 2015-04-16 DIAGNOSIS — E1122 Type 2 diabetes mellitus with diabetic chronic kidney disease: Secondary | ICD-10-CM | POA: Diagnosis not present

## 2015-04-16 DIAGNOSIS — I129 Hypertensive chronic kidney disease with stage 1 through stage 4 chronic kidney disease, or unspecified chronic kidney disease: Secondary | ICD-10-CM | POA: Diagnosis not present

## 2015-04-18 ENCOUNTER — Emergency Department: Payer: Medicare Other

## 2015-04-18 ENCOUNTER — Emergency Department
Admission: EM | Admit: 2015-04-18 | Discharge: 2015-04-19 | Disposition: A | Discharge status: Home / Self Care | Payer: Medicare Other | Attending: Emergency Medicine | Admitting: Emergency Medicine

## 2015-04-18 DIAGNOSIS — E119 Type 2 diabetes mellitus without complications: Secondary | ICD-10-CM | POA: Diagnosis not present

## 2015-04-18 DIAGNOSIS — R55 Syncope and collapse: Secondary | ICD-10-CM

## 2015-04-18 DIAGNOSIS — N182 Chronic kidney disease, stage 2 (mild): Secondary | ICD-10-CM | POA: Diagnosis not present

## 2015-04-18 DIAGNOSIS — I129 Hypertensive chronic kidney disease with stage 1 through stage 4 chronic kidney disease, or unspecified chronic kidney disease: Secondary | ICD-10-CM | POA: Diagnosis not present

## 2015-04-18 DIAGNOSIS — Z79899 Other long term (current) drug therapy: Secondary | ICD-10-CM | POA: Insufficient documentation

## 2015-04-18 DIAGNOSIS — R51 Headache: Secondary | ICD-10-CM | POA: Insufficient documentation

## 2015-04-18 DIAGNOSIS — Z794 Long term (current) use of insulin: Secondary | ICD-10-CM | POA: Insufficient documentation

## 2015-04-18 LAB — CBC
HCT: 35.2 % (ref 35.0–47.0)
HEMOGLOBIN: 12.2 g/dL (ref 12.0–16.0)
MCH: 32.3 pg (ref 26.0–34.0)
MCHC: 34.6 g/dL (ref 32.0–36.0)
MCV: 93.5 fL (ref 80.0–100.0)
Platelets: 234 10*3/uL (ref 150–440)
RBC: 3.77 MIL/uL — AB (ref 3.80–5.20)
RDW: 13.9 % (ref 11.5–14.5)
WBC: 9.7 10*3/uL (ref 3.6–11.0)

## 2015-04-18 NOTE — ED Notes (Addendum)
Pt to room 10 via EMS from home; pt A&Ox3; reports HA to temples and feeling weak; denies any recent illness or c/o today; EMS reports pt was sitting at table talking with daughter when she had syncopal episode; upon arrival pt was confused & lethargic but returned to baseline upon loading into truck for transport; pt reports recent CVA with NH placement for rehab; living with daughter at present; pt denies subsequent unilateral weakness or numbness post CVA but st does need assistance with ambulation; speech clear, MAEW, PERRL; pt dressed in gown and on card monitor

## 2015-04-18 NOTE — ED Notes (Signed)
Pt to CT via stretcher accomp by CT tech 

## 2015-04-18 NOTE — ED Notes (Signed)
Pt returned to room; replaced card monitor

## 2015-04-18 NOTE — ED Provider Notes (Signed)
[facility] Emergency Department Provider Note  ____________________________________________  Time seen: 10:00 PM  I have reviewed the triage vital signs and the nursing notes.   HISTORY  Chief Complaint Loss of Consciousness and Headache    HPI Abigail Boyd is a 82 y.o. female brought to the ED via EMS after syncope today. She was in her usual state of health when she ate a fish dinner. She was sitting at the table with her daughter when she became dizzy and then passed out. Her daughter says that the patient was poorly responsive for about 3 minutes. When she awoke she was having some throbbing pain in her bilateral temples which was mild. When EMS arrived to transport to the hospital, her symptoms began to resolve and at present she feels back to baseline and denies any symptoms at all. No preceding chest pain shortness of breath abdominal pain or back pain, not afterward either. No vision changes numbness tingling or weakness. No recent trauma. No jaw claudication.     Past Medical History  Diagnosis Date  . History of unstable angina 06/13/2011    Jaw pain awakening from sleep -- Myoview --CATH --> PCI  . Abnormal nuclear stress test 06/21/2011    Inferolateral reversible defect;--> cardiac cath & PCI of CxOM, occluded RCA with collaterals;; followup Myoview 11/2012: Low risk. Fixed basal inferior artifact normal EF. No ischemia  . CAD S/P percutaneous coronary angioplasty     s/P PCI to proximal OM1 w/ DES; occluded RCA with bridging and L-R collaterals (medical management)  . Shortness of breath on exertion October 2013    2-D echo: Normal EF>55%, Gr 1DD, mild aortic sclerosis; Evaluated with CPET - peak VO2 97%; Chronotropic Incompetence (submaximal effort)  . Chronotropic incompetence with sinus node dysfunction [facility]) October 2013    On CPET test; beta blockers reduced  . PAF (paroxysmal atrial fibrillation) ([place]) 10-11/ 2014    CardioNet Event  Monitor: NSR & S Brady -- Rates 50-100; Total A. fib burden 35 hours and 27 minutes. 1296 episodes, longest was 1 hour 29 minutes. Rate ranged from 52-169 beats per minute.  . Tachycardia-bradycardia syndrome ([place]) 11/2012  . History of syncope     Per EP - neurocardiogenic & not Bradycardia related (no PPM)  . OSA (obstructive sleep apnea)     hx bladder infections  . Diabetes mellitus type 2, controlled ([place])     On oral medications  . Essential hypertension     Allowing for permissive hypertension to avoid orthostatic hypotension  . Hyperlipidemia with target LDL less than 70     HDL at goal, LDL not at goal, borderline triglycerides. On Crestor 20 mg  . Chronic kidney disease (CKD), stage II (mild)     Related to current bladder infections (although diabetes cannot be excluded)  . Spinal stenosis of lumbar region 11/2011  . Osteoarthritis   . GERD (gastroesophageal reflux disease)     On PPI  . Seasonal allergies   . Anxiety     When necessary Xanax  . Glaucoma   . Bilateral cataracts     Status post stroke or correction  . Diverticulitis   . Migraines   . Stroke ([place])   . Urge incontinence      Patient Active Problem List   Diagnosis Date Noted  . Urge incontinence 03/15/2015  . Bilateral lower extremity edema 03/15/2015  . Constipation 03/15/2015  . Right knee pain 03/15/2015  . Hematochezia 03/15/2015  . Cerebrovascular  accident (CVA) ([place])   . CKD (chronic kidney disease), stage II   . Preop cardiovascular exam 06/10/2014  . PLMD (periodic limb movement disorder) 06/12/2013  . Obstructive sleep apnea 03/05/2013  . Tachy-brady syndrome: Rates range from 50-169 bpm in A. fib 12/20/2012  . History of syncope: Unclear etiology 12/19/2012  . Chronotropic incompetence - partially medication related 08/18/2012  . PAF (paroxysmal atrial fibrillation) ([place]) 05/16/2012    Class: Diagnosis of  . Long term (current) use of anticoagulants 05/16/2012  . CAD-PCI OM1, 100%  RCA (L-R colllaterals) 07/07/2011  . Hyperlipidemia with target LDL less than 70     Class: Diagnosis of  . Essential hypertension     Class: Diagnosis of  . Chronic kidney disease     Class: Diagnosis of  . Spinal stenosis     Class: History of  . Diabetes mellitus (HCC)     Class: Diagnosis of  . Unstable angina - history of; resolved, status post PCI of the OM 06/13/2011    Class: History of     Past Surgical History  Procedure Laterality Date  . Cataracts      both eyes  . Colonoscopy    . Vaginal hysterectomy    . Breast biopsy      both breast  . Cardiac catheterization  07/05/2011    Unstable Angina --> Myoview wiht Inf-Lat Ischemia.  Proximal OM1 lesion --> PCI; 100% mid RCA with right to right and left to right bridging collaterals from circumflex RPL and LAD the PDA.  Marland Kitchen Coronary angioplasty with stent placement  07/05/2011    Promus Element DES 2.5 mm x 16 mm-post dilated to 2.65 mm CX-Proximal OM1  . Nm myoview ltd  October 2014     Low risk. Fixed basal inferior artifact normal EF. No ischemia --> (as compared to pre-PCI Myoview revealing inferolateral ischemia)  . Transthoracic echocardiogram  October 2013    Normal EF, >55%. No regional WMA, Grd 1 DD,  mildly sclerotic aortic valve without stenosis  . Left heart catheterization with coronary angiogram N/A 07/06/2011    Procedure: LEFT HEART CATHETERIZATION WITH CORONARY ANGIOGRAM;  Surgeon: Leonie Man, MD;  Location: [facility] CATH LAB;  Service: Cardiovascular;  Laterality: N/A;  . Knee arthroscopy with medial menisectomy Right 06/24/2014    Procedure: RIGHT KNEE ARTHROSCOPY WITH partial lateral MENISECTOMY, abrasion chondroplasty of medial femoral condyle and patella, microfracture technique;  Surgeon: Latanya Maudlin, MD;  Location: [facility] ORS;  Service: Orthopedics;  Laterality: Right;  . Appendectomy       Current Outpatient Rx  Name  Route  Sig  Dispense  Refill  . acetaminophen (TYLENOL) 650 MG CR tablet   Oral    Take 1,300 mg by mouth 2 (two) times daily as needed for pain.         Marland Kitchen ALPRAZolam (XANAX) 0.25 MG tablet   Oral   Take 1 tablet (0.25 mg total) by mouth at bedtime as needed for sleep or anxiety.   30 tablet   0   . AZELASTINE HCL NA   Each Nare   Place 1 spray into both nostrils daily.          Marland Kitchen BACLOFEN PO      Take 5mg  by mouth daily for muscle spasm         . cholecalciferol (VITAMIN D) 1000 UNITS tablet   Oral   Take 1,000 Units by mouth daily.         Marland Kitchen docusate  sodium (STOOL SOFTENER) 100 MG capsule   Oral   Take by mouth.         . fexofenadine (ALLEGRA) 180 MG tablet   Oral   Take 180 mg by mouth at bedtime.          . fish oil-omega-3 fatty acids 1000 MG capsule   Oral   Take 1 g by mouth daily.          . furosemide (LASIX) 20 MG tablet   Oral   Take 1 tablet (20 mg total) by mouth daily as needed. Once daily as needed for swelling   30 tablet   3   . Insulin Glargine (LANTUS) 100 UNIT/ML Solostar Pen   Subcutaneous   Inject 48 Units into the skin.         Marland Kitchen insulin lispro (HUMALOG KWIKPEN) 100 UNIT/ML KiwkPen   Subcutaneous   Inject 1 Units into the skin 3 (three) times daily as needed (CBG >200).         Marland Kitchen ipratropium (ATROVENT) 0.06 % nasal spray   Nasal   Place 1 spray into the nose daily.          Marland Kitchen latanoprost (XALATAN) 0.005 % ophthalmic solution      Instill one drop into each eye every night at bedtime         . levocetirizine (XYZAL) 5 MG tablet               . LUMIGAN 0.01 % SOLN                 Dispense as written.   . Magnesium 500 MG CAPS   Oral   Take 500 mg by mouth daily as needed (constipation).          . metoprolol tartrate (LOPRESSOR) 25 MG tablet   Oral   Take 1 tablet (25 mg total) by mouth 2 (two) times daily. HOLD for SBP <110 and HR < 60   60 tablet   0   . mirabegron ER (MYRBETRIQ) 50 MG TB24 tablet      Take one tablet by mouth once daily for kidneys         .  nitroGLYCERIN (NITROSTAT) 0.4 MG SL tablet   Sublingual   Place 0.4 mg under the tongue every 5 (five) minutes as needed for chest pain.         Marland Kitchen nystatin cream (MYCOSTATIN)   Topical   Apply 1 application topically daily.      1   . omeprazole (PRILOSEC) 20 MG capsule               . ONE TOUCH ULTRA TEST test strip                 Dispense as written.   Marland Kitchen PARoxetine (PAXIL) 10 MG tablet               . Rivaroxaban (XARELTO) 15 MG TABS tablet   Oral   Take 15 mg by mouth daily with supper.         Marland Kitchen rOPINIRole (REQUIP) 0.5 MG tablet               . senna-docusate (SENOKOT-S) 8.6-50 MG tablet      Take 1 tablet by mouth at bedtime for constipation         . sitaGLIPtin (JANUVIA) 50 MG tablet   Oral   Take 50 mg by mouth at bedtime.          Marland Kitchen  VESICARE 5 MG tablet      TAKE 1 TABLET (5 MG TOTAL) BY MOUTH ONCE DAILY.      11     Dispense as written.      Allergies Lisinopril; Losartan; and Sulfa antibiotics   Family History  Problem Relation Age of Onset  . Colon cancer Neg Hx   . Esophageal cancer Neg Hx   . Stomach cancer Neg Hx   . COPD Mother   . Heart attack Father   . Heart disease Father   . Cancer Sister   . Cancer Sister   . Pulmonary fibrosis Mother   . Clotting disorder Sister   . Heart attack Sister   . Stroke Father   . Arthritis    . Breast cancer Sister     Social History Social History  Substance Use Topics  . Smoking status: Never Smoker   . Smokeless tobacco: Never Used  . Alcohol Use: No    Review of Systems  Constitutional:   No fever or chills. No weight changes Eyes:   No blurry vision or double vision.  ENT:   No sore throat.  Cardiovascular:   No chest pain. Respiratory:   No dyspnea or cough. Gastrointestinal:   Negative for abdominal pain, vomiting and diarrhea.  No BRBPR or melena. Genitourinary:   Negative for dysuria or difficulty urinating. Musculoskeletal:   Negative for back pain.  No joint swelling or pain. Skin:   Negative for rash. Neurological:   Bilateral headache, syncope. No weakness or paresthesia Psychiatric:  No anxiety or depression.   Endocrine:  No changes in energy or sleep difficulty.  10-point ROS otherwise negative.  ____________________________________________   PHYSICAL EXAM:  VITAL SIGNS: ED Triage Vitals  Enc Vitals Group     BP 04/18/15 2156 142/92 mmHg     Pulse Rate 04/18/15 2156 75     Resp 04/18/15 2156 20     Temp 04/18/15 2156 98 F (36.7 C)     Temp Source 04/18/15 2156 Oral     SpO2 04/18/15 2156 95 %     Weight 04/18/15 2156 180 lb (81.647 kg)     Height 04/18/15 2156 5\' 4"  (1.626 m)     Head Cir --      Peak Flow --      Pain Score 04/18/15 2156 8     Pain Loc --      Pain Edu? --      Excl. in [place]? --     Vital signs reviewed, nursing assessments reviewed.   Constitutional:   Alert and oriented. Well appearing and in no distress. Eyes:   No scleral icterus. No conjunctival pallor. PERRL. EOMI ENT   Head:   Normocephalic and atraumatic.   Nose:   No congestion/rhinnorhea. No septal hematoma   Mouth/Throat:   MMM, no pharyngeal erythema. No peritonsillar mass.    Neck:   No stridor. No SubQ emphysema. No meningismus. Hematological/Lymphatic/Immunilogical:   No cervical lymphadenopathy. Cardiovascular:   RRR. Symmetric bilateral radial and DP pulses.  No murmurs.  Respiratory:   Normal respiratory effort without tachypnea nor retractions. Breath sounds are clear and equal bilaterally. No wheezes/rales/rhonchi. Gastrointestinal:   Soft and nontender. Non distended. There is no CVA tenderness.  No rebound, rigidity, or guarding. Genitourinary:   deferred Musculoskeletal:   Nontender with normal range of motion in all extremities. No joint effusions.  No lower extremity tenderness.  No edema. Neurologic:   Normal speech and language.  CN 2-10 normal. Motor grossly intact. No gross focal neurologic  deficits are appreciated.  Skin:    Skin is warm, dry and intact. No rash noted.  No petechiae, purpura, or bullae. Psychiatric:   Mood and affect are normal. ____________________________________________    LABS (pertinent positives/negatives) (all labs ordered are listed, but only abnormal results are displayed) Labs Reviewed  CBC - Abnormal; Notable for the following:    RBC 3.77 (*)    All other components within normal limits  URINALYSIS COMPLETEWITH MICROSCOPIC ([facility] ONLY)  BASIC METABOLIC PANEL   ____________________________________________   EKG  Interpreted by me Normal sinus rhythm rate of 75, normal axis and intervals. Incomplete right bundle-branch block. Normal ST segments and T waves.  ____________________________________________    RADIOLOGY  CT head unremarkable  ____________________________________________   PROCEDURES   ____________________________________________   INITIAL IMPRESSION / ASSESSMENT AND PLAN / ED COURSE  Pertinent labs & imaging results that were available during my care of the patient were reviewed by me and considered in my medical decision making (see chart for details).  Patient presents with syncope of unclear etiology. Because she had some mild headache afterward we'll do a CT scan of the head. However this is bilateral and has already resolved, so I have low suspicion that this represents a stroke or intracranial hemorrhage. Low suspicion for temporal arteritis, meningitis or encephalitis. She still overall very well-appearing at her usual baseline, vital signs are stable and essentially unremarkable. We'll check labs including chemistry and urinalysis. If no severe abnormalities think the patient is suitable for discharge home and follow-up with primary care.  Case signed out to Dr. Dahlia Client 11:30 PM     ____________________________________________   FINAL CLINICAL IMPRESSION(S) / ED DIAGNOSES  Final diagnoses:  Syncope,  unspecified syncope type      Carrie Mew, MD 04/18/15 2325

## 2015-04-19 LAB — BASIC METABOLIC PANEL
Anion gap: 9 (ref 5–15)
BUN: 34 mg/dL — ABNORMAL HIGH (ref 6–20)
CALCIUM: 8.9 mg/dL (ref 8.9–10.3)
CO2: 26 mmol/L (ref 22–32)
CREATININE: 1.33 mg/dL — AB (ref 0.44–1.00)
Chloride: 103 mmol/L (ref 101–111)
GFR, EST AFRICAN AMERICAN: 42 mL/min — AB (ref >60)
GFR, EST NON AFRICAN AMERICAN: 36 mL/min — AB (ref >60)
Glucose, Bld: 234 mg/dL — ABNORMAL HIGH (ref 65–99)
Potassium: 4 mmol/L (ref 3.5–5.1)
Sodium: 138 mmol/L (ref 135–145)

## 2015-04-19 LAB — TROPONIN I

## 2015-04-19 NOTE — Discharge Instructions (Signed)

## 2015-04-19 NOTE — ED Provider Notes (Signed)
-----------------------------------------   2:15 AM on 04/19/2015 -----------------------------------------   Blood pressure 142/59, pulse 77, temperature 98 F (36.7 C), temperature source Oral, resp. rate 13, height 5\' 4"  (1.626 m), weight 180 lb (81.647 kg), SpO2 94 %.  Assuming care from Dr. Joni Fears.  In short, Abigail Boyd is a 82 y.o. female with a chief complaint of Loss of Consciousness and Headache .  Refer to the original H&P for additional details.  The current plan of care is to follow-up the results of the patient's BMP and reassess the patient.  Patient's BMP is unremarkable. I was asked to check the patient's legs by the nurse and the patient's legs are warm and pink without any specific concern for cellulitis. The patient's CT scan was also unremarkable and she is not feeling dizzy or lightheaded at this time. As discussed with Dr. Joni Fears and his plan the patient will be discharged home to follow-up with her primary care physician. I discussed this with the patient's daughter and the patient and they feel comfortable with this plan.   Loney Hering, MD 04/19/15 [phone]

## 2015-04-20 DIAGNOSIS — E1122 Type 2 diabetes mellitus with diabetic chronic kidney disease: Secondary | ICD-10-CM | POA: Diagnosis not present

## 2015-04-20 DIAGNOSIS — M25561 Pain in right knee: Secondary | ICD-10-CM | POA: Diagnosis not present

## 2015-04-20 DIAGNOSIS — I69351 Hemiplegia and hemiparesis following cerebral infarction affecting right dominant side: Secondary | ICD-10-CM | POA: Diagnosis not present

## 2015-04-20 DIAGNOSIS — I129 Hypertensive chronic kidney disease with stage 1 through stage 4 chronic kidney disease, or unspecified chronic kidney disease: Secondary | ICD-10-CM | POA: Diagnosis not present

## 2015-04-21 DIAGNOSIS — I129 Hypertensive chronic kidney disease with stage 1 through stage 4 chronic kidney disease, or unspecified chronic kidney disease: Secondary | ICD-10-CM | POA: Diagnosis not present

## 2015-04-21 DIAGNOSIS — E1122 Type 2 diabetes mellitus with diabetic chronic kidney disease: Secondary | ICD-10-CM | POA: Diagnosis not present

## 2015-04-21 DIAGNOSIS — M25561 Pain in right knee: Secondary | ICD-10-CM | POA: Diagnosis not present

## 2015-04-21 DIAGNOSIS — I69351 Hemiplegia and hemiparesis following cerebral infarction affecting right dominant side: Secondary | ICD-10-CM | POA: Diagnosis not present

## 2015-04-22 DIAGNOSIS — I129 Hypertensive chronic kidney disease with stage 1 through stage 4 chronic kidney disease, or unspecified chronic kidney disease: Secondary | ICD-10-CM | POA: Diagnosis not present

## 2015-04-22 DIAGNOSIS — E1122 Type 2 diabetes mellitus with diabetic chronic kidney disease: Secondary | ICD-10-CM | POA: Diagnosis not present

## 2015-04-22 DIAGNOSIS — I4589 Other specified conduction disorders: Secondary | ICD-10-CM | POA: Diagnosis not present

## 2015-04-22 DIAGNOSIS — M25561 Pain in right knee: Secondary | ICD-10-CM | POA: Diagnosis not present

## 2015-04-22 DIAGNOSIS — I69351 Hemiplegia and hemiparesis following cerebral infarction affecting right dominant side: Secondary | ICD-10-CM | POA: Diagnosis not present

## 2015-04-22 DIAGNOSIS — R0609 Other forms of dyspnea: Secondary | ICD-10-CM | POA: Diagnosis not present

## 2015-04-22 DIAGNOSIS — R609 Edema, unspecified: Secondary | ICD-10-CM | POA: Diagnosis not present

## 2015-04-22 DIAGNOSIS — Z79899 Other long term (current) drug therapy: Secondary | ICD-10-CM | POA: Diagnosis not present

## 2015-04-23 ENCOUNTER — Encounter: Payer: Self-pay | Admitting: Family Medicine

## 2015-04-23 ENCOUNTER — Ambulatory Visit (INDEPENDENT_AMBULATORY_CARE_PROVIDER_SITE_OTHER): Payer: Medicare Other | Admitting: Family Medicine

## 2015-04-23 VITALS — BP 142/78 | HR 68 | Temp 98.3°F | Ht 64.0 in | Wt 176.0 lb

## 2015-04-23 DIAGNOSIS — R55 Syncope and collapse: Secondary | ICD-10-CM

## 2015-04-23 LAB — BASIC METABOLIC PANEL
BUN / CREAT RATIO: 22 (ref 11–26)
BUN: 26 mg/dL (ref 8–27)
CHLORIDE: 98 mmol/L (ref 96–106)
CO2: 24 mmol/L (ref 18–29)
Calcium: 9.8 mg/dL (ref 8.7–10.3)
Creatinine, Ser: 1.18 mg/dL — ABNORMAL HIGH (ref 0.57–1.00)
GFR calc non Af Amer: 43 mL/min/{1.73_m2} — ABNORMAL LOW (ref >59)
GFR, EST AFRICAN AMERICAN: 49 mL/min/{1.73_m2} — AB (ref >59)
Glucose: 185 mg/dL — ABNORMAL HIGH (ref 65–99)
POTASSIUM: 4.9 mmol/L (ref 3.5–5.2)
Sodium: 140 mmol/L (ref 134–144)

## 2015-04-23 LAB — BRAIN NATRIURETIC PEPTIDE: BNP: 75.3 pg/mL (ref 0.0–100.0)

## 2015-04-23 NOTE — Progress Notes (Signed)
Pre visit review using our clinic review tool, if applicable. No additional management support is needed unless otherwise documented below in the visit note. 

## 2015-04-23 NOTE — Patient Instructions (Signed)
Nice to see you. We are going to have a follow-up with her cardiologist next week for further evaluation. If you develop chest pain, shortness of breath, palpitations, syncope, blood sugar less than 100, blood sugar greater than 300, or any new or changing symptoms please seek medical attention.

## 2015-04-23 NOTE — Progress Notes (Signed)
Patient ID: Abigail Boyd, female   DOB: Aug 14, 1929, 82 y.o.   MRN: [id number]  Tommi Rumps, MD Phone: [phone]  Abigail Boyd is a 82 y.o. female who presents today for emergency room follow-up.  Patient presented to the emergency room after having a syncopal event. She notes she was sitting at her kitchen table talking and then noted she felt funny though had difficulty describing what this meant. She leaned forward and then reportedly doesn't remember anything after this. Her daughter states she was mumbling some when this occurred, though she was not responsive. They felt as though she had passed out. Notes her eyes rolled back in her head and her lips turned blue. Her color drained from her. Patient does not remember any of this. No seizure activity. No incontinence. No chest pain, shortness breath, or palpitations with this. They checked her blood sugar was 242. They checked her blood pressure and they read as an error. She returned to baseline relatively quickly after this. She has a history of doing this in the past. She was evaluated in the emergency room and no cause was apparent. She was discharged home to follow-up with me in cardiology. He has not had any recurrent issues. No chest pain, shortness breath, or palpitations. No she is neurologically at her baseline.  PMH: nonsmoker.   ROS see history of present illness  Objective  Physical Exam Filed Vitals:   04/23/15 1530  BP: 142/78  Pulse: 68  Temp: 98.3 F (36.8 C)    BP Readings from Last 3 Encounters:  04/23/15 142/78  04/19/15 137/66  04/14/15 132/68   Wt Readings from Last 3 Encounters:  04/23/15 176 lb (79.833 kg)  04/18/15 180 lb (81.647 kg)  04/14/15 180 lb (81.647 kg)    Physical Exam  Constitutional: No distress.  HENT:  Head: Normocephalic and atraumatic.  Mouth/Throat: Oropharynx is clear and moist.  Eyes: Conjunctivae are normal. Pupils are equal, round, and reactive to light.  Cardiovascular:  Normal rate, regular rhythm and normal heart sounds.  Exam reveals no gallop and no friction rub.   No murmur heard. Pulmonary/Chest: Effort normal and breath sounds normal. No respiratory distress. She has no wheezes. She has no rales.  Neurological: She is alert.  CN 2-12 intact, 5/5 strength in bilateral biceps, triceps, grip, quads, hamstrings, plantar and dorsiflexion, sensation to light touch intact in bilateral UE and LE, 2+ patellar reflexes  Skin: Skin is warm and dry. She is not diaphoretic.     Assessment/Plan: Please see individual problem list.  Syncope Patient with a recent syncopal event. Evaluated in the emergency room with no obvious cause. Blood sugars in the normal range when this occurred. No seizure activity. It is concerning that she had no prior symptoms to this occurring bringing into question whether or not this could've been related to an arrhythmia. We will have her follow-up with cardiology to determine if further evaluation is needed. She is neurologically intact today. She feels well. She will continue to monitor. She is given return precautions.    No orders of the defined types were placed in this encounter.    No orders of the defined types were placed in this encounter.     Tommi Rumps

## 2015-04-23 NOTE — Assessment & Plan Note (Signed)
Patient with a recent syncopal event. Evaluated in the emergency room with no obvious cause. Blood sugars in the normal range when this occurred. No seizure activity. It is concerning that she had no prior symptoms to this occurring bringing into question whether or not this could've been related to an arrhythmia. We will have her follow-up with cardiology to determine if further evaluation is needed. She is neurologically intact today. She feels well. She will continue to monitor. She is given return precautions.

## 2015-04-26 ENCOUNTER — Encounter: Payer: Medicare Other | Admitting: Physician Assistant

## 2015-04-26 ENCOUNTER — Encounter: Payer: Self-pay | Admitting: Physician Assistant

## 2015-04-26 ENCOUNTER — Ambulatory Visit: Payer: Medicare Other | Admitting: Cardiovascular Disease

## 2015-04-26 ENCOUNTER — Ambulatory Visit (INDEPENDENT_AMBULATORY_CARE_PROVIDER_SITE_OTHER): Payer: Medicare Other | Admitting: Physician Assistant

## 2015-04-26 VITALS — BP 132/70 | HR 64 | Ht 64.0 in | Wt 174.6 lb

## 2015-04-26 DIAGNOSIS — E1122 Type 2 diabetes mellitus with diabetic chronic kidney disease: Secondary | ICD-10-CM | POA: Diagnosis not present

## 2015-04-26 DIAGNOSIS — I2510 Atherosclerotic heart disease of native coronary artery without angina pectoris: Secondary | ICD-10-CM | POA: Diagnosis not present

## 2015-04-26 DIAGNOSIS — N182 Chronic kidney disease, stage 2 (mild): Secondary | ICD-10-CM | POA: Diagnosis not present

## 2015-04-26 DIAGNOSIS — I10 Essential (primary) hypertension: Secondary | ICD-10-CM

## 2015-04-26 DIAGNOSIS — I480 Paroxysmal atrial fibrillation: Secondary | ICD-10-CM

## 2015-04-26 DIAGNOSIS — Z9181 History of falling: Secondary | ICD-10-CM | POA: Diagnosis not present

## 2015-04-26 DIAGNOSIS — Z794 Long term (current) use of insulin: Secondary | ICD-10-CM | POA: Diagnosis not present

## 2015-04-26 DIAGNOSIS — R55 Syncope and collapse: Secondary | ICD-10-CM | POA: Diagnosis not present

## 2015-04-26 DIAGNOSIS — Z7901 Long term (current) use of anticoagulants: Secondary | ICD-10-CM | POA: Diagnosis not present

## 2015-04-26 DIAGNOSIS — M25561 Pain in right knee: Secondary | ICD-10-CM | POA: Diagnosis not present

## 2015-04-26 DIAGNOSIS — I69351 Hemiplegia and hemiparesis following cerebral infarction affecting right dominant side: Secondary | ICD-10-CM | POA: Diagnosis not present

## 2015-04-26 DIAGNOSIS — I129 Hypertensive chronic kidney disease with stage 1 through stage 4 chronic kidney disease, or unspecified chronic kidney disease: Secondary | ICD-10-CM | POA: Diagnosis not present

## 2015-04-26 MED ORDER — METOPROLOL TARTRATE 25 MG PO TABS: 25.0000 mg | ORAL_TABLET | Freq: Two times a day (BID) | ORAL | Status: DC

## 2015-04-26 NOTE — Assessment & Plan Note (Signed)
Was in normal sinus rhythm in ER. Maintained on low-dose metoprolol and Xarelto.

## 2015-04-26 NOTE — Assessment & Plan Note (Signed)
Blood pressure is stable. No symptoms of dizziness with change of position.

## 2015-04-26 NOTE — Patient Instructions (Addendum)
Medication Instructions:     If you need a refill on your cardiac medications before your next appointment, please call your pharmacy.  Labwork:   Testing/Procedures: Your physician has recommended that you wear a 48 HOUR holter monitor. Holter monitors are medical devices that record the heart's electrical activity. Doctors most often use these monitors to diagnose arrhythmias. Arrhythmias are problems with the speed or rhythm of the heartbeat. The monitor is a small, portable device. You can wear one while you do your normal daily activities. This is usually used to diagnose what is causing palpitations/syncope (passing out).    Follow-Up:  .WITH  DR Ellyn Hack IN [place] NEXT ASAP AVAILABLE APPT SLOT    Any Other Special Instructions Will Be Listed Below (If Applicable).

## 2015-04-26 NOTE — Progress Notes (Signed)
Cardiology Office Note   Date:  04/26/2015   ID:  Abigail Boyd, DOB [birth year], MRN [id number]  PCP:  Tommi Rumps, MD  Cardiologist:  Dr. Ellyn Hack   Chief Complaint:    History of Present Illness: Abigail Boyd is a 82 y.o. female who presents for post ER visit for syncope.  Last week she was sitting at the table she felt dizzy and her eyes rolled back in her head and she passed out briefly. By the time EMS got there she had recovered.  Her sister said she had been struggling to produce saliva disputed and a cup for a DNA tests the family wanted to have done. She is wondering if this had something to do with it but they never told the ER docs at. In the emergency room blood work was stable as well as vital signs. Head CT showed stable atrophy and chronic small vessel ischemia. Expected evolution of previous small subcortical infarct in the left frontal lobe.  Had a CVA in 11/2014 and Coumadin was switched to Xarelto.She had a history of unstable angina back in 2013 underwent PCI to an OM. She was also noted to have a chronically occluded RCA with collaterals. She suddenly developed atrial fibrillation with mild tachybradycardia syndrome. She has had at least one syncopal event they were not sure the true etiology. She was seen by Dr. Caryl Comes from electrophysiology who felt that she did not meet criteria for pacemaker.   She is accompanied by her sister today. Her sister thinks she might need a pacemaker to fix her atrial fibrillation. In the emergency room she was in normal sinus rhythm and the patient has no symptoms of palpitations or arrhythmias. She has had a total of 3 syncopal episodes over the years. One was while she was being evaluated at [facility] and other ones while she was getting her hair done. She denies any chest pain, palpitations, dyspnea, dyspnea on exertion, dizziness or presyncope.   Past Medical History  Diagnosis Date  . History of unstable angina 06/13/2011    Jaw  pain awakening from sleep -- Myoview --CATH --> PCI  . Abnormal nuclear stress test 06/21/2011    Inferolateral reversible defect;--> cardiac cath & PCI of CxOM, occluded RCA with collaterals;; followup Myoview 11/2012: Low risk. Fixed basal inferior artifact normal EF. No ischemia  . CAD S/P percutaneous coronary angioplasty     s/P PCI to proximal OM1 w/ DES; occluded RCA with bridging and L-R collaterals (medical management)  . Shortness of breath on exertion October 2013    2-D echo: Normal EF>55%, Gr 1DD, mild aortic sclerosis; Evaluated with CPET - peak VO2 97%; Chronotropic Incompetence (submaximal effort)  . Chronotropic incompetence with sinus node dysfunction [facility]) October 2013    On CPET test; beta blockers reduced  . PAF (paroxysmal atrial fibrillation) ([place]) 10-11/ 2014    CardioNet Event Monitor: NSR & S Brady -- Rates 50-100; Total A. fib burden 35 hours and 27 minutes. 1296 episodes, longest was 1 hour 29 minutes. Rate ranged from 52-169 beats per minute.  . Tachycardia-bradycardia syndrome ([place]) 11/2012  . History of syncope     Per EP - neurocardiogenic & not Bradycardia related (no PPM)  . OSA (obstructive sleep apnea)     hx bladder infections  . Diabetes mellitus type 2, controlled ([place])     On oral medications  . Essential hypertension     Allowing for permissive hypertension to avoid orthostatic hypotension  . Hyperlipidemia with  target LDL less than 70     HDL at goal, LDL not at goal, borderline triglycerides. On Crestor 20 mg  . Chronic kidney disease (CKD), stage II (mild)     Related to current bladder infections (although diabetes cannot be excluded)  . Spinal stenosis of lumbar region 11/2011  . Osteoarthritis   . GERD (gastroesophageal reflux disease)     On PPI  . Seasonal allergies   . Anxiety     When necessary Xanax  . Glaucoma   . Bilateral cataracts     Status post stroke or correction  . Diverticulitis   . Migraines   . Stroke ([place])   . Urge  incontinence     Past Surgical History  Procedure Laterality Date  . Cataracts      both eyes  . Colonoscopy    . Vaginal hysterectomy    . Breast biopsy      both breast  . Cardiac catheterization  07/05/2011    Unstable Angina --> Myoview wiht Inf-Lat Ischemia.  Proximal OM1 lesion --> PCI; 100% mid RCA with right to right and left to right bridging collaterals from circumflex RPL and LAD the PDA.  Marland Kitchen Coronary angioplasty with stent placement  07/05/2011    Promus Element DES 2.5 mm x 16 mm-post dilated to 2.65 mm CX-Proximal OM1  . Nm myoview ltd  October 2014     Low risk. Fixed basal inferior artifact normal EF. No ischemia --> (as compared to pre-PCI Myoview revealing inferolateral ischemia)  . Transthoracic echocardiogram  October 2013    Normal EF, >55%. No regional WMA, Grd 1 DD,  mildly sclerotic aortic valve without stenosis  . Left heart catheterization with coronary angiogram N/A 07/06/2011    Procedure: LEFT HEART CATHETERIZATION WITH CORONARY ANGIOGRAM;  Surgeon: Leonie Man, MD;  Location: [facility] CATH LAB;  Service: Cardiovascular;  Laterality: N/A;  . Knee arthroscopy with medial menisectomy Right 06/24/2014    Procedure: RIGHT KNEE ARTHROSCOPY WITH partial lateral MENISECTOMY, abrasion chondroplasty of medial femoral condyle and patella, microfracture technique;  Surgeon: Latanya Maudlin, MD;  Location: [facility] ORS;  Service: Orthopedics;  Laterality: Right;  . Appendectomy       Current Outpatient Prescriptions  Medication Sig Dispense Refill  . acetaminophen (TYLENOL) 650 MG CR tablet Take 1,300 mg by mouth 2 (two) times daily as needed for pain.    Marland Kitchen ALPRAZolam (XANAX) 0.25 MG tablet Take 1 tablet (0.25 mg total) by mouth at bedtime as needed for sleep or anxiety. 30 tablet 0  . AZELASTINE HCL NA Place 1 spray into both nostrils daily.     Marland Kitchen BACLOFEN PO Take 5mg  by mouth daily for muscle spasm    . cholecalciferol (VITAMIN D) 1000 UNITS tablet Take 1,000 Units by mouth  daily.    Marland Kitchen docusate sodium (STOOL SOFTENER) 100 MG capsule Take by mouth.    . fexofenadine (ALLEGRA) 180 MG tablet Take 180 mg by mouth at bedtime.     . fish oil-omega-3 fatty acids 1000 MG capsule Take 1 g by mouth daily.     . furosemide (LASIX) 20 MG tablet Take 1 tablet (20 mg total) by mouth daily as needed. Once daily as needed for swelling 30 tablet 3  . Insulin Glargine (LANTUS) 100 UNIT/ML Solostar Pen Inject 48 Units into the skin.    Marland Kitchen insulin lispro (HUMALOG KWIKPEN) 100 UNIT/ML KiwkPen Inject 1 Units into the skin 3 (three) times daily as needed (CBG >200).    Marland Kitchen  ipratropium (ATROVENT) 0.06 % nasal spray Place 1 spray into the nose daily.     Marland Kitchen latanoprost (XALATAN) 0.005 % ophthalmic solution Instill one drop into each eye every night at bedtime    . levocetirizine (XYZAL) 5 MG tablet     . LUMIGAN 0.01 % SOLN     . Magnesium 500 MG CAPS Take 500 mg by mouth daily as needed (constipation).     . metoprolol tartrate (LOPRESSOR) 25 MG tablet Take 1 tablet (25 mg total) by mouth 2 (two) times daily. HOLD for SBP <110 and HR < 60 60 tablet 0  . mirabegron ER (MYRBETRIQ) 50 MG TB24 tablet Take one tablet by mouth once daily for kidneys    . nitroGLYCERIN (NITROSTAT) 0.4 MG SL tablet Place 0.4 mg under the tongue every 5 (five) minutes as needed for chest pain.    Marland Kitchen nystatin cream (MYCOSTATIN) Apply 1 application topically daily.  1  . omeprazole (PRILOSEC) 20 MG capsule     . ONE TOUCH ULTRA TEST test strip     . PARoxetine (PAXIL) 10 MG tablet     . Rivaroxaban (XARELTO) 15 MG TABS tablet Take 15 mg by mouth daily with supper.    Marland Kitchen rOPINIRole (REQUIP) 0.5 MG tablet     . senna-docusate (SENOKOT-S) 8.6-50 MG tablet Take 1 tablet by mouth at bedtime for constipation    . sitaGLIPtin (JANUVIA) 50 MG tablet Take 50 mg by mouth at bedtime.     . VESICARE 5 MG tablet TAKE 1 TABLET (5 MG TOTAL) BY MOUTH ONCE DAILY.  11   No current facility-administered medications for this visit.     Allergies:   Lisinopril; Losartan; and Sulfa antibiotics    Social History:  The patient  reports that she has never smoked. She has never used smokeless tobacco. She reports that she does not drink alcohol or use illicit drugs.   Family History:  The patient's    family history includes Breast cancer in her sister; COPD in her mother; Cancer in her sister and sister; Clotting disorder in her sister; Heart attack in her father and sister; Heart disease in her father; Pulmonary fibrosis in her mother; Stroke in her father. There is no history of Colon cancer, Esophageal cancer, or Stomach cancer.    ROS:  Please see the history of present illness.   Otherwise, review of systems are positive for chronic knee pain , in a wheelchair, back pain.   All other systems are reviewed and negative.    PHYSICAL EXAM: VS:  BP 132/70 mmHg  Pulse 64  Ht 5\' 4"  (1.626 m)  Wt 174 lb 9.6 oz (79.198 kg)  BMI 29.96 kg/m2  SpO2 96% , BMI Body mass index is 29.96 kg/(m^2). GEN: Well nourished, well developed, in no acute distress Neck: no JVD, HJR, carotid bruits, or masses Cardiac: RRR;  1/6 systolic murmur at the left sternal bordergallop, rubs, thrill or heave,  Respiratory:  clear to auscultation bilaterally, normal work of breathing GI: soft, nontender, nondistended, + BS MS: no deformity or atrophy Extremities: without cyanosis, clubbing, edema, good distal pulses bilaterally.  Skin: warm and dry, no rash Neuro:  Strength and sensation are intact    EKG:  EKG is not ordered today.  EKG from emergency room reviewed on 04/18/15 showed normal sinus rhythm     Recent Labs: 03/15/2015: TSH 2.60 04/14/2015: ALT 11 04/18/2015: Hemoglobin 12.2; Platelets 234 04/22/2015: BNP 75.3; BUN 26; Creatinine, Ser 1.18*; Potassium 4.9;  Sodium 140    Lipid Panel    Component Value Date/Time   CHOL 163 12/27/2014 0735   CHOL 177 12/18/2012 0941   TRIG 131 12/27/2014 0735   HDL 39* 12/27/2014 0735   HDL 50  12/18/2012 0941   CHOLHDL 4.2 12/27/2014 0735   CHOLHDL 3.5 12/18/2012 0941   VLDL 26 12/27/2014 0735   LDLCALC 98 12/27/2014 0735   LDLCALC 90 12/18/2012 0941      Wt Readings from Last 3 Encounters:  04/26/15 174 lb 9.6 oz (79.198 kg)  04/23/15 176 lb (79.833 kg)  04/18/15 180 lb (81.647 kg)      Other studies Reviewed: Additional studies/ records that were reviewed today include and review of the records demonstrates:   IMPRESSION: Stable atrophy and chronic small vessel ischemia. Expected evolution of previous small subcortical infarct in the left frontal lobe. No acute intracranial abnormality.     Electronically Signed   By: Jeb Levering M.D.   On: 04/18/2015 22:39   Summary:   - Technically difficult due to high bifurcations and tortuosity. - Bilateral -1% to 39% ICA stenosis lower end of range. Vertebral   artery flow is anterade.   Other specific details can be found in the table(s) above. Prepared and Electronically Authenticated by   Antony Contras 2016-10-31T13:46:17 Study Conclusions  - Left ventricle: The cavity size was normal. Wall thickness was   increased in a pattern of moderate LVH. Systolic function was   vigorous. The estimated ejection fraction was in the range of 65%   to 70%. Wall motion was normal; there were no regional wall   motion abnormalities. Doppler parameters are consistent with   abnormal left ventricular relaxation (grade 1 diastolic   dysfunction). - Aortic valve: Valve area (VTI): 1.71 cm^2. Valve area (Vmax):   1.45 cm^2. Valve area (Vmean): 1.37 cm^2.  ASSESSMENT AND PLAN:  Syncope  Patient had an episode of syncope prompting an emergency room visit on 04/18/15. It occurred while sitting down after she had struggled to produce saliva for DNA test for her family. Workup in the emergency room was inconclusive. She does have history of tachybradycardia syndrome but has had no symptoms of this. She does have PAF. We'll place  a 72 hour monitor to try to document any possible arrhythmias to cause syncope. Follow-up with Dr. Ellyn Hack.  PAF (paroxysmal atrial fibrillation) (HCC)  Was in normal sinus rhythm in ER. Maintained on low-dose metoprolol and Xarelto.  Essential hypertension  Blood pressure is stable. No symptoms of dizziness with change of position.  Cerebrovascular accident (CVA) [facility])  Recent CVA in October 2016 now maintained on Xarelto.    Sumner Boast, PA-C  04/26/2015 2:09 PM    [facility] [place], [place], [place]  [address] Phone: [phone]; Fax: [phone]

## 2015-04-26 NOTE — Assessment & Plan Note (Signed)
Patient had an episode of syncope prompting an emergency room visit on 04/18/15. It occurred while sitting down after she had struggled to produce saliva for DNA test for her family. Workup in the emergency room was inconclusive. She does have history of tachybradycardia syndrome but has had no symptoms of this. She does have PAF. We'll place a 72 hour monitor to try to document any possible arrhythmias to cause syncope. Follow-up with Dr. Ellyn Hack.

## 2015-04-26 NOTE — Assessment & Plan Note (Signed)
Recent CVA in October 2016 now maintained on Xarelto.

## 2015-04-27 DIAGNOSIS — Z9181 History of falling: Secondary | ICD-10-CM | POA: Diagnosis not present

## 2015-04-27 DIAGNOSIS — E1122 Type 2 diabetes mellitus with diabetic chronic kidney disease: Secondary | ICD-10-CM | POA: Diagnosis not present

## 2015-04-27 DIAGNOSIS — Z794 Long term (current) use of insulin: Secondary | ICD-10-CM | POA: Diagnosis not present

## 2015-04-27 DIAGNOSIS — I480 Paroxysmal atrial fibrillation: Secondary | ICD-10-CM | POA: Diagnosis not present

## 2015-04-27 DIAGNOSIS — M25561 Pain in right knee: Secondary | ICD-10-CM | POA: Diagnosis not present

## 2015-04-27 DIAGNOSIS — Z7901 Long term (current) use of anticoagulants: Secondary | ICD-10-CM | POA: Diagnosis not present

## 2015-04-27 DIAGNOSIS — I2510 Atherosclerotic heart disease of native coronary artery without angina pectoris: Secondary | ICD-10-CM | POA: Diagnosis not present

## 2015-04-27 DIAGNOSIS — I129 Hypertensive chronic kidney disease with stage 1 through stage 4 chronic kidney disease, or unspecified chronic kidney disease: Secondary | ICD-10-CM | POA: Diagnosis not present

## 2015-04-27 DIAGNOSIS — I69351 Hemiplegia and hemiparesis following cerebral infarction affecting right dominant side: Secondary | ICD-10-CM | POA: Diagnosis not present

## 2015-04-27 DIAGNOSIS — N182 Chronic kidney disease, stage 2 (mild): Secondary | ICD-10-CM | POA: Diagnosis not present

## 2015-04-28 DIAGNOSIS — M25561 Pain in right knee: Secondary | ICD-10-CM | POA: Diagnosis not present

## 2015-04-28 DIAGNOSIS — I2510 Atherosclerotic heart disease of native coronary artery without angina pectoris: Secondary | ICD-10-CM | POA: Diagnosis not present

## 2015-04-28 DIAGNOSIS — N182 Chronic kidney disease, stage 2 (mild): Secondary | ICD-10-CM | POA: Diagnosis not present

## 2015-04-28 DIAGNOSIS — I480 Paroxysmal atrial fibrillation: Secondary | ICD-10-CM | POA: Diagnosis not present

## 2015-04-28 DIAGNOSIS — I129 Hypertensive chronic kidney disease with stage 1 through stage 4 chronic kidney disease, or unspecified chronic kidney disease: Secondary | ICD-10-CM | POA: Diagnosis not present

## 2015-04-28 DIAGNOSIS — Z9181 History of falling: Secondary | ICD-10-CM | POA: Diagnosis not present

## 2015-04-28 DIAGNOSIS — I69351 Hemiplegia and hemiparesis following cerebral infarction affecting right dominant side: Secondary | ICD-10-CM | POA: Diagnosis not present

## 2015-04-28 DIAGNOSIS — Z7901 Long term (current) use of anticoagulants: Secondary | ICD-10-CM | POA: Diagnosis not present

## 2015-04-28 DIAGNOSIS — Z794 Long term (current) use of insulin: Secondary | ICD-10-CM | POA: Diagnosis not present

## 2015-04-28 DIAGNOSIS — E1122 Type 2 diabetes mellitus with diabetic chronic kidney disease: Secondary | ICD-10-CM | POA: Diagnosis not present

## 2015-04-28 HISTORY — PX: OTHER SURGICAL HISTORY: SHX169

## 2015-04-29 DIAGNOSIS — I129 Hypertensive chronic kidney disease with stage 1 through stage 4 chronic kidney disease, or unspecified chronic kidney disease: Secondary | ICD-10-CM | POA: Diagnosis not present

## 2015-04-29 DIAGNOSIS — I2510 Atherosclerotic heart disease of native coronary artery without angina pectoris: Secondary | ICD-10-CM | POA: Diagnosis not present

## 2015-04-29 DIAGNOSIS — N182 Chronic kidney disease, stage 2 (mild): Secondary | ICD-10-CM | POA: Diagnosis not present

## 2015-04-29 DIAGNOSIS — Z794 Long term (current) use of insulin: Secondary | ICD-10-CM | POA: Diagnosis not present

## 2015-04-29 DIAGNOSIS — I69351 Hemiplegia and hemiparesis following cerebral infarction affecting right dominant side: Secondary | ICD-10-CM | POA: Diagnosis not present

## 2015-04-29 DIAGNOSIS — E1122 Type 2 diabetes mellitus with diabetic chronic kidney disease: Secondary | ICD-10-CM | POA: Diagnosis not present

## 2015-04-29 DIAGNOSIS — Z9181 History of falling: Secondary | ICD-10-CM | POA: Diagnosis not present

## 2015-04-29 DIAGNOSIS — M25561 Pain in right knee: Secondary | ICD-10-CM | POA: Diagnosis not present

## 2015-04-29 DIAGNOSIS — Z7901 Long term (current) use of anticoagulants: Secondary | ICD-10-CM | POA: Diagnosis not present

## 2015-04-29 DIAGNOSIS — I480 Paroxysmal atrial fibrillation: Secondary | ICD-10-CM | POA: Diagnosis not present

## 2015-04-30 ENCOUNTER — Ambulatory Visit (INDEPENDENT_AMBULATORY_CARE_PROVIDER_SITE_OTHER): Payer: Medicare Other

## 2015-04-30 DIAGNOSIS — R55 Syncope and collapse: Secondary | ICD-10-CM | POA: Diagnosis not present

## 2015-05-02 ENCOUNTER — Other Ambulatory Visit: Payer: Self-pay | Admitting: Internal Medicine

## 2015-05-03 DIAGNOSIS — E1122 Type 2 diabetes mellitus with diabetic chronic kidney disease: Secondary | ICD-10-CM | POA: Diagnosis not present

## 2015-05-03 DIAGNOSIS — N182 Chronic kidney disease, stage 2 (mild): Secondary | ICD-10-CM | POA: Diagnosis not present

## 2015-05-03 DIAGNOSIS — Z794 Long term (current) use of insulin: Secondary | ICD-10-CM | POA: Diagnosis not present

## 2015-05-03 DIAGNOSIS — I2510 Atherosclerotic heart disease of native coronary artery without angina pectoris: Secondary | ICD-10-CM | POA: Diagnosis not present

## 2015-05-03 DIAGNOSIS — Z9181 History of falling: Secondary | ICD-10-CM | POA: Diagnosis not present

## 2015-05-03 DIAGNOSIS — Z7901 Long term (current) use of anticoagulants: Secondary | ICD-10-CM | POA: Diagnosis not present

## 2015-05-03 DIAGNOSIS — I69351 Hemiplegia and hemiparesis following cerebral infarction affecting right dominant side: Secondary | ICD-10-CM | POA: Diagnosis not present

## 2015-05-03 DIAGNOSIS — I129 Hypertensive chronic kidney disease with stage 1 through stage 4 chronic kidney disease, or unspecified chronic kidney disease: Secondary | ICD-10-CM | POA: Diagnosis not present

## 2015-05-03 DIAGNOSIS — I480 Paroxysmal atrial fibrillation: Secondary | ICD-10-CM | POA: Diagnosis not present

## 2015-05-03 DIAGNOSIS — M25561 Pain in right knee: Secondary | ICD-10-CM | POA: Diagnosis not present

## 2015-05-03 NOTE — Telephone Encounter (Signed)
This should be filled by the previously prescribing physician.

## 2015-05-03 NOTE — Telephone Encounter (Signed)
Rx is usually filled by [facility].  Okay to refill?

## 2015-05-04 ENCOUNTER — Other Ambulatory Visit: Payer: Self-pay

## 2015-05-04 DIAGNOSIS — Z794 Long term (current) use of insulin: Secondary | ICD-10-CM | POA: Diagnosis not present

## 2015-05-04 DIAGNOSIS — N182 Chronic kidney disease, stage 2 (mild): Secondary | ICD-10-CM | POA: Diagnosis not present

## 2015-05-04 DIAGNOSIS — E1122 Type 2 diabetes mellitus with diabetic chronic kidney disease: Secondary | ICD-10-CM | POA: Diagnosis not present

## 2015-05-04 DIAGNOSIS — I129 Hypertensive chronic kidney disease with stage 1 through stage 4 chronic kidney disease, or unspecified chronic kidney disease: Secondary | ICD-10-CM | POA: Diagnosis not present

## 2015-05-04 DIAGNOSIS — I480 Paroxysmal atrial fibrillation: Secondary | ICD-10-CM | POA: Diagnosis not present

## 2015-05-04 DIAGNOSIS — M25561 Pain in right knee: Secondary | ICD-10-CM | POA: Diagnosis not present

## 2015-05-04 DIAGNOSIS — Z9181 History of falling: Secondary | ICD-10-CM | POA: Diagnosis not present

## 2015-05-04 DIAGNOSIS — Z7901 Long term (current) use of anticoagulants: Secondary | ICD-10-CM | POA: Diagnosis not present

## 2015-05-04 DIAGNOSIS — I2510 Atherosclerotic heart disease of native coronary artery without angina pectoris: Secondary | ICD-10-CM | POA: Diagnosis not present

## 2015-05-04 DIAGNOSIS — I69351 Hemiplegia and hemiparesis following cerebral infarction affecting right dominant side: Secondary | ICD-10-CM | POA: Diagnosis not present

## 2015-05-04 NOTE — Telephone Encounter (Signed)
Pt's daughter states that the Requip she was prescribed through her pervious doctor has retired Laurian Brim, MD. Pt is requesting a refill, she has only seen Dr. Hardin Negus once after having a stroke. I see in a previous message from Almyra Free that it was denied but daughter states that her mother needs medication in order to sleep at night. Please advise okay to refill, thanks

## 2015-05-04 NOTE — Telephone Encounter (Signed)
Please confirm current dosing with patients daughter. Once I see the dosing we will consider refill. Thanks.

## 2015-05-05 DIAGNOSIS — Z9181 History of falling: Secondary | ICD-10-CM | POA: Diagnosis not present

## 2015-05-05 DIAGNOSIS — I69351 Hemiplegia and hemiparesis following cerebral infarction affecting right dominant side: Secondary | ICD-10-CM | POA: Diagnosis not present

## 2015-05-05 DIAGNOSIS — M25561 Pain in right knee: Secondary | ICD-10-CM | POA: Diagnosis not present

## 2015-05-05 DIAGNOSIS — I2510 Atherosclerotic heart disease of native coronary artery without angina pectoris: Secondary | ICD-10-CM | POA: Diagnosis not present

## 2015-05-05 DIAGNOSIS — E1122 Type 2 diabetes mellitus with diabetic chronic kidney disease: Secondary | ICD-10-CM | POA: Diagnosis not present

## 2015-05-05 DIAGNOSIS — Z794 Long term (current) use of insulin: Secondary | ICD-10-CM | POA: Diagnosis not present

## 2015-05-05 DIAGNOSIS — N182 Chronic kidney disease, stage 2 (mild): Secondary | ICD-10-CM | POA: Diagnosis not present

## 2015-05-05 DIAGNOSIS — I480 Paroxysmal atrial fibrillation: Secondary | ICD-10-CM | POA: Diagnosis not present

## 2015-05-05 DIAGNOSIS — Z7901 Long term (current) use of anticoagulants: Secondary | ICD-10-CM | POA: Diagnosis not present

## 2015-05-05 DIAGNOSIS — I129 Hypertensive chronic kidney disease with stage 1 through stage 4 chronic kidney disease, or unspecified chronic kidney disease: Secondary | ICD-10-CM | POA: Diagnosis not present

## 2015-05-05 MED ORDER — ROPINIROLE HCL 0.5 MG PO TABS: 0.5000 mg | ORAL_TABLET | Freq: Every day | ORAL | Status: DC

## 2015-05-05 NOTE — Telephone Encounter (Signed)
Daughter confirmed pt is currently taking 0.5mg  of Requip. Please advise, thanks

## 2015-05-05 NOTE — Telephone Encounter (Signed)
Sent to pharmacy 

## 2015-05-05 NOTE — Telephone Encounter (Signed)
Notified pts daughter

## 2015-05-06 ENCOUNTER — Other Ambulatory Visit: Payer: Self-pay | Admitting: Cardiology

## 2015-05-06 ENCOUNTER — Ambulatory Visit
Admission: RE | Admit: 2015-05-06 | Discharge: 2015-05-06 | Disposition: A | Discharge status: Home / Self Care | Payer: Medicare Other | Source: Ambulatory Visit | Attending: Cardiology | Admitting: Cardiology

## 2015-05-06 DIAGNOSIS — R69 Illness, unspecified: Secondary | ICD-10-CM | POA: Insufficient documentation

## 2015-05-07 DIAGNOSIS — I129 Hypertensive chronic kidney disease with stage 1 through stage 4 chronic kidney disease, or unspecified chronic kidney disease: Secondary | ICD-10-CM | POA: Diagnosis not present

## 2015-05-07 DIAGNOSIS — Z794 Long term (current) use of insulin: Secondary | ICD-10-CM | POA: Diagnosis not present

## 2015-05-07 DIAGNOSIS — I480 Paroxysmal atrial fibrillation: Secondary | ICD-10-CM | POA: Diagnosis not present

## 2015-05-07 DIAGNOSIS — I69351 Hemiplegia and hemiparesis following cerebral infarction affecting right dominant side: Secondary | ICD-10-CM | POA: Diagnosis not present

## 2015-05-07 DIAGNOSIS — Z9181 History of falling: Secondary | ICD-10-CM | POA: Diagnosis not present

## 2015-05-07 DIAGNOSIS — M25561 Pain in right knee: Secondary | ICD-10-CM | POA: Diagnosis not present

## 2015-05-07 DIAGNOSIS — N182 Chronic kidney disease, stage 2 (mild): Secondary | ICD-10-CM | POA: Diagnosis not present

## 2015-05-07 DIAGNOSIS — I2510 Atherosclerotic heart disease of native coronary artery without angina pectoris: Secondary | ICD-10-CM | POA: Diagnosis not present

## 2015-05-07 DIAGNOSIS — E1122 Type 2 diabetes mellitus with diabetic chronic kidney disease: Secondary | ICD-10-CM | POA: Diagnosis not present

## 2015-05-07 DIAGNOSIS — Z7901 Long term (current) use of anticoagulants: Secondary | ICD-10-CM | POA: Diagnosis not present

## 2015-05-10 ENCOUNTER — Other Ambulatory Visit: Payer: Self-pay | Admitting: Family Medicine

## 2015-05-10 DIAGNOSIS — M25561 Pain in right knee: Secondary | ICD-10-CM | POA: Diagnosis not present

## 2015-05-10 DIAGNOSIS — Z794 Long term (current) use of insulin: Secondary | ICD-10-CM | POA: Diagnosis not present

## 2015-05-10 DIAGNOSIS — Z9181 History of falling: Secondary | ICD-10-CM | POA: Diagnosis not present

## 2015-05-10 DIAGNOSIS — I480 Paroxysmal atrial fibrillation: Secondary | ICD-10-CM | POA: Diagnosis not present

## 2015-05-10 DIAGNOSIS — R55 Syncope and collapse: Secondary | ICD-10-CM

## 2015-05-10 DIAGNOSIS — Z7901 Long term (current) use of anticoagulants: Secondary | ICD-10-CM | POA: Diagnosis not present

## 2015-05-10 DIAGNOSIS — I2510 Atherosclerotic heart disease of native coronary artery without angina pectoris: Secondary | ICD-10-CM | POA: Diagnosis not present

## 2015-05-10 DIAGNOSIS — E1122 Type 2 diabetes mellitus with diabetic chronic kidney disease: Secondary | ICD-10-CM | POA: Diagnosis not present

## 2015-05-10 DIAGNOSIS — I129 Hypertensive chronic kidney disease with stage 1 through stage 4 chronic kidney disease, or unspecified chronic kidney disease: Secondary | ICD-10-CM | POA: Diagnosis not present

## 2015-05-10 DIAGNOSIS — N182 Chronic kidney disease, stage 2 (mild): Secondary | ICD-10-CM | POA: Diagnosis not present

## 2015-05-10 DIAGNOSIS — I69351 Hemiplegia and hemiparesis following cerebral infarction affecting right dominant side: Secondary | ICD-10-CM | POA: Diagnosis not present

## 2015-05-12 ENCOUNTER — Ambulatory Visit: Payer: Medicare Other | Admitting: Cardiology

## 2015-05-12 DIAGNOSIS — E118 Type 2 diabetes mellitus with unspecified complications: Secondary | ICD-10-CM | POA: Diagnosis not present

## 2015-05-12 DIAGNOSIS — E119 Type 2 diabetes mellitus without complications: Secondary | ICD-10-CM | POA: Diagnosis not present

## 2015-05-12 DIAGNOSIS — Z794 Long term (current) use of insulin: Secondary | ICD-10-CM | POA: Diagnosis not present

## 2015-05-13 ENCOUNTER — Ambulatory Visit (INDEPENDENT_AMBULATORY_CARE_PROVIDER_SITE_OTHER): Payer: Medicare Other | Admitting: Internal Medicine

## 2015-05-13 ENCOUNTER — Encounter: Payer: Self-pay | Admitting: Internal Medicine

## 2015-05-13 VITALS — BP 117/76 | HR 64 | Ht 64.0 in | Wt 178.4 lb

## 2015-05-13 DIAGNOSIS — I480 Paroxysmal atrial fibrillation: Secondary | ICD-10-CM | POA: Diagnosis not present

## 2015-05-13 DIAGNOSIS — I69351 Hemiplegia and hemiparesis following cerebral infarction affecting right dominant side: Secondary | ICD-10-CM | POA: Diagnosis not present

## 2015-05-13 DIAGNOSIS — R55 Syncope and collapse: Secondary | ICD-10-CM

## 2015-05-13 DIAGNOSIS — I129 Hypertensive chronic kidney disease with stage 1 through stage 4 chronic kidney disease, or unspecified chronic kidney disease: Secondary | ICD-10-CM | POA: Diagnosis not present

## 2015-05-13 DIAGNOSIS — E1122 Type 2 diabetes mellitus with diabetic chronic kidney disease: Secondary | ICD-10-CM | POA: Diagnosis not present

## 2015-05-13 DIAGNOSIS — Z794 Long term (current) use of insulin: Secondary | ICD-10-CM | POA: Diagnosis not present

## 2015-05-13 DIAGNOSIS — M25561 Pain in right knee: Secondary | ICD-10-CM | POA: Diagnosis not present

## 2015-05-13 DIAGNOSIS — Z9181 History of falling: Secondary | ICD-10-CM | POA: Diagnosis not present

## 2015-05-13 DIAGNOSIS — N182 Chronic kidney disease, stage 2 (mild): Secondary | ICD-10-CM | POA: Diagnosis not present

## 2015-05-13 DIAGNOSIS — I2510 Atherosclerotic heart disease of native coronary artery without angina pectoris: Secondary | ICD-10-CM | POA: Diagnosis not present

## 2015-05-13 DIAGNOSIS — Z7901 Long term (current) use of anticoagulants: Secondary | ICD-10-CM | POA: Diagnosis not present

## 2015-05-13 NOTE — Patient Instructions (Signed)
Medication Instructions:  Your physician recommends that you continue on your current medications as directed. Please refer to the Current Medication list given to you today.   Labwork: none  Testing/Procedures: none  Follow-Up: Your physician recommends that you schedule a follow-up appointment as needed.    Any Other Special Instructions Will Be Listed Below (If Applicable).     If you need a refill on your cardiac medications before your next appointment, please call your pharmacy.   

## 2015-05-13 NOTE — Progress Notes (Signed)
ELECTROPHYSIOLOGY CONSULT NOTE  Patient ID: Abigail Boyd, MRN: [id number], DOB/AGE: 1929-11-25 82 y.o. Admit date: (Not on file) Date of Consult: 05/13/2015  Primary Physician: Tommi Rumps, MD Primary Cardiologist:  [facility]  Chief Complaint: syncope   HPI Abigail Boyd is a 82 y.o. female  Seen again because of recurrent syncope.  I had seen her a couple years ago most recently 2015 for syncope which is felt at that time to be neurally mediated.  This episode occurred while she was trying to spit up for a DNA test; she has a dry mouth and this was a very vigorous and repeated activity.  She also was seen at [facility] for an episode of syncope that occurred in the endocrine clinic at which point her blood pressure was measured to be 80. She is seen by cardiology as well as EP. the latter felt that these episodes were neurally mediated  She has a past medical history notable for atrial fibrillation as well as coronary artery disease. She is a prior history of PCI with known residual occluded RCA  ECG 2/17 demonstrated normal sinus rhythm with normal intervals  Holter monitor was reviewed 3/17 demonstrated sinus rhythm with some atrial fibrillation post termination pause was noted that was not significant  Echocardiogram 10/16 demonstrated normal LV function      Past Medical History  Diagnosis Date  . History of unstable angina 06/13/2011    Jaw pain awakening from sleep -- Myoview --CATH --> PCI  . Abnormal nuclear stress test 06/21/2011    Inferolateral reversible defect;--> cardiac cath & PCI of CxOM, occluded RCA with collaterals;; followup Myoview 11/2012: Low risk. Fixed basal inferior artifact normal EF. No ischemia  . CAD S/P percutaneous coronary angioplasty     s/P PCI to proximal OM1 w/ DES; occluded RCA with bridging and L-R collaterals (medical management)  . Shortness of breath on exertion October 2013    2-D echo: Normal EF>55%, Gr 1DD, mild aortic sclerosis;  Evaluated with CPET - peak VO2 97%; Chronotropic Incompetence (submaximal effort)  . Chronotropic incompetence with sinus node dysfunction [facility]) October 2013    On CPET test; beta blockers reduced  . PAF (paroxysmal atrial fibrillation) ([place]) 10-11/ 2014    CardioNet Event Monitor: NSR & S Brady -- Rates 50-100; Total A. fib burden 35 hours and 27 minutes. 1296 episodes, longest was 1 hour 29 minutes. Rate ranged from 52-169 beats per minute.  . Tachycardia-bradycardia syndrome ([place]) 11/2012  . History of syncope     Per EP - neurocardiogenic & not Bradycardia related (no PPM)  . OSA (obstructive sleep apnea)     hx bladder infections  . Diabetes mellitus type 2, controlled ([place])     On oral medications  . Essential hypertension     Allowing for permissive hypertension to avoid orthostatic hypotension  . Hyperlipidemia with target LDL less than 70     HDL at goal, LDL not at goal, borderline triglycerides. On Crestor 20 mg  . Chronic kidney disease (CKD), stage II (mild)     Related to current bladder infections (although diabetes cannot be excluded)  . Spinal stenosis of lumbar region 11/2011  . Osteoarthritis   . GERD (gastroesophageal reflux disease)     On PPI  . Seasonal allergies   . Anxiety     When necessary Xanax  . Glaucoma   . Bilateral cataracts     Status post stroke or correction  . Diverticulitis   .  Migraines   . Stroke ([place])   . Urge incontinence       Surgical History:  Past Surgical History  Procedure Laterality Date  . Cataracts      both eyes  . Colonoscopy    . Vaginal hysterectomy    . Breast biopsy      both breast  . Cardiac catheterization  07/05/2011    Unstable Angina --> Myoview wiht Inf-Lat Ischemia.  Proximal OM1 lesion --> PCI; 100% mid RCA with right to right and left to right bridging collaterals from circumflex RPL and LAD the PDA.  Marland Kitchen Coronary angioplasty with stent placement  07/05/2011    Promus Element DES 2.5 mm x 16 mm-post dilated  to 2.65 mm CX-Proximal OM1  . Nm myoview ltd  October 2014     Low risk. Fixed basal inferior artifact normal EF. No ischemia --> (as compared to pre-PCI Myoview revealing inferolateral ischemia)  . Transthoracic echocardiogram  October 2013    Normal EF, >55%. No regional WMA, Grd 1 DD,  mildly sclerotic aortic valve without stenosis  . Left heart catheterization with coronary angiogram N/A 07/06/2011    Procedure: LEFT HEART CATHETERIZATION WITH CORONARY ANGIOGRAM;  Surgeon: Leonie Man, MD;  Location: [facility] CATH LAB;  Service: Cardiovascular;  Laterality: N/A;  . Knee arthroscopy with medial menisectomy Right 06/24/2014    Procedure: RIGHT KNEE ARTHROSCOPY WITH partial lateral MENISECTOMY, abrasion chondroplasty of medial femoral condyle and patella, microfracture technique;  Surgeon: Latanya Maudlin, MD;  Location: [facility] ORS;  Service: Orthopedics;  Laterality: Right;  . Appendectomy       Home Meds: Prior to Admission medications   Medication Sig Start Date End Date Taking? Authorizing Provider  acetaminophen (TYLENOL) 650 MG CR tablet Take 1,300 mg by mouth 2 (two) times daily as needed for pain.   Yes Historical Provider, MD  ALPRAZolam (XANAX) 0.25 MG tablet Take 1 tablet (0.25 mg total) by mouth at bedtime as needed for sleep or anxiety. 12/29/14  Yes Nita Sells, MD  AZELASTINE HCL NA Place 1 spray into both nostrils daily.    Yes Historical Provider, MD  BACLOFEN PO Take 5mg  by mouth daily for muscle spasm   Yes Historical Provider, MD  cholecalciferol (VITAMIN D) 1000 UNITS tablet Take 1,000 Units by mouth daily.   Yes Historical Provider, MD  docusate sodium (STOOL SOFTENER) 100 MG capsule Take 100 mg by mouth daily.    Yes Historical Provider, MD  fexofenadine (ALLEGRA) 180 MG tablet Take 180 mg by mouth at bedtime.    Yes Historical Provider, MD  fish oil-omega-3 fatty acids 1000 MG capsule Take 1 g by mouth daily.    Yes Historical Provider, MD  furosemide (LASIX) 20 MG tablet  Take 1 tablet (20 mg total) by mouth daily as needed. Once daily as needed for swelling 03/10/15  Yes Leonie Man, MD  Insulin Glargine (LANTUS) 100 UNIT/ML Solostar Pen Inject 48 Units into the skin. 03/31/15  Yes Historical Provider, MD  insulin lispro (HUMALOG KWIKPEN) 100 UNIT/ML KiwkPen Inject 1 Units into the skin 3 (three) times daily as needed (CBG >200).   Yes Historical Provider, MD  ipratropium (ATROVENT) 0.06 % nasal spray Place 1 spray into the nose daily.  12/08/10  Yes Historical Provider, MD  latanoprost (XALATAN) 0.005 % ophthalmic solution Instill one drop into each eye every night at bedtime   Yes Historical Provider, MD  levocetirizine (XYZAL) 5 MG tablet Take 5 mg by mouth every evening.  03/22/15  Yes Historical Provider, MD  LUMIGAN 0.01 % SOLN Place 1 drop into both eyes at bedtime.  03/06/15  Yes Historical Provider, MD  Magnesium 500 MG CAPS Take 500 mg by mouth daily as needed (constipation).    Yes Historical Provider, MD  metoprolol tartrate (LOPRESSOR) 25 MG tablet Take 1 tablet (25 mg total) by mouth 2 (two) times daily. HOLD for SBP <110 and HR < 60 04/26/15  Yes Rhonda G Barrett, PA-C  mirabegron ER (MYRBETRIQ) 50 MG TB24 tablet Take one tablet by mouth once daily for kidneys   Yes Historical Provider, MD  nitroGLYCERIN (NITROSTAT) 0.4 MG SL tablet Place 0.4 mg under the tongue every 5 (five) minutes as needed for chest pain.   Yes Historical Provider, MD  nystatin cream (MYCOSTATIN) Apply 1 application topically daily. 12/03/14  Yes Historical Provider, MD  omeprazole (PRILOSEC) 20 MG capsule Take 20 mg by mouth daily.  02/28/15  Yes Historical Provider, MD  ONE TOUCH ULTRA TEST test strip 1 each by Other route as needed.  03/18/15  Yes Historical Provider, MD  PARoxetine (PAXIL) 10 MG tablet Take 10 mg by mouth daily.  02/28/15  Yes Historical Provider, MD  Rivaroxaban (XARELTO) 15 MG TABS tablet Take 15 mg by mouth daily with supper.   Yes Historical Provider, MD    rOPINIRole (REQUIP) 0.5 MG tablet Take 1 tablet (0.5 mg total) by mouth at bedtime. 05/05/15  Yes Leone Haven, MD  senna-docusate (SENOKOT-S) 8.6-50 MG tablet Take 1 tablet by mouth at bedtime for constipation   Yes Historical Provider, MD  sitaGLIPtin (JANUVIA) 50 MG tablet Take 50 mg by mouth at bedtime.    Yes Historical Provider, MD  VESICARE 5 MG tablet TAKE 1 TABLET (5 MG TOTAL) BY MOUTH ONCE DAILY. 03/30/15  Yes Historical Provider, MD    Allergies:  Allergies  Allergen Reactions  . Lisinopril Cough  . Losartan Cough  . Sulfa Antibiotics Hives    Social History   Social History  . Marital Status: Widowed    Spouse Name: N/A  . Number of Children: N/A  . Years of Education: N/A   Occupational History  . retired    Social History Main Topics  . Smoking status: Never Smoker   . Smokeless tobacco: Never Used  . Alcohol Use: No  . Drug Use: No  . Sexual Activity: No   Other Topics Concern  . Not on file   Social History Narrative   Widowed mother of one, grandmother 2. Has not been using silver take as much lately due to knee discomfort. Usually exercises with Silver Sneakers on regular basis.     Family History  Problem Relation Age of Onset  . Colon cancer Neg Hx   . Esophageal cancer Neg Hx   . Stomach cancer Neg Hx   . COPD Mother   . Heart attack Father   . Heart disease Father   . Cancer Sister   . Cancer Sister   . Pulmonary fibrosis Mother   . Clotting disorder Sister   . Heart attack Sister   . Stroke Father   . Arthritis    . Breast cancer Sister      ROS:  Please see the history of present illness.     All other systems reviewed and negative.    Physical Exam:   Blood pressure 117/76, pulse 64, height 5\' 4"  (1.626 m), weight 178 lb 6.4 oz (80.922 kg). General: Well developed, well nourished  female in no acute distress. Head: Normocephalic, atraumatic, sclera non-icteric, no xanthomas, nares are without discharge. EENT: normal  Lymph  Nodes:  none Neck: Negative for carotid bruits. JVD not elevated. Back:without scoliosis kyphosis   Lungs: Clear bilaterally to auscultation without wheezes, rales, or rhonchi. Breathing is unlabored. Heart: RRR with S1 S2. No  murmur . No rubs, or gallops appreciated. Abdomen: Soft, non-tender, non-distended with normoactive bowel sounds. No hepatomegaly. No rebound/guarding. No obvious abdominal masses. Msk:  Strength and tone appear normal for age. Extremities: No clubbing or cyanosis. No * edema.  Distal pedal pulses are 2+ and equal bilaterally. Skin: Warm and Dry Neuro: Alert and oriented X 3. CN III-XII intact Grossly normal sensory and motor function . Psych:  Responds to questions appropriately with a normal affect.      Labs: Cardiac Enzymes No results for input(s): CKTOTAL, CKMB, TROPONINI in the last 72 hours. CBC Lab Results  Component Value Date   WBC 9.7 04/18/2015   HGB 12.2 04/18/2015   HCT 35.2 04/18/2015   MCV 93.5 04/18/2015   PLT 234 04/18/2015   PROTIME: No results for input(s): LABPROT, INR in the last 72 hours. Chemistry No results for input(s): NA, K, CL, CO2, BUN, CREATININE, CALCIUM, PROT, BILITOT, ALKPHOS, ALT, AST, GLUCOSE in the last 168 hours.  Invalid input(s): LABALBU Lipids Lab Results  Component Value Date   CHOL 163 12/27/2014   HDL 39* 12/27/2014   LDLCALC 98 12/27/2014   TRIG 131 12/27/2014   BNP No results found for: PROBNP Thyroid Function Tests: No results for input(s): TSH, T4TOTAL, T3FREE, THYROIDAB in the last 72 hours.  Invalid input(s): FREET3 Miscellaneous No results found for: DDIMER  Radiology/Studies:  Ct Head Wo Contrast  04/18/2015  CLINICAL DATA:  Headache and weakness.  Syncopal episode. EXAM: CT HEAD WITHOUT CONTRAST TECHNIQUE: Contiguous axial images were obtained from the base of the skull through the vertex without intravenous contrast. COMPARISON:  Head CT and brain MRI 12/26/2014 FINDINGS: Expected evolution  of small periventricular left frontal infarct since prior exam. Degree of atrophy and chronic small vessel ischemia is stable. No intracranial hemorrhage, mass effect, edema or areas of acute ischemia. The calvarium is intact. Small fluid level in the sphenoid sinus. The mastoid air cells are well aerated. IMPRESSION: Stable atrophy and chronic small vessel ischemia. Expected evolution of previous small subcortical infarct in the left frontal lobe. No acute intracranial abnormality. Electronically Signed   By: Jeb Levering M.D.   On: 04/18/2015 22:39        Assessment and Plan: Syncope  The presumption the past has been in this lady syncope has been neurally mediated. I reviewed the notes from [place]. Her blood pressure was in the 50s or 60s; she was seen by Dr. Glennon Mac who concurred that this was likely vasovagal mediated. I have reviewed the importance of the awareness of the prodrome to try to mitigate symptoms.     Virl Axe   enjoyed good night

## 2015-05-17 ENCOUNTER — Telehealth: Payer: Self-pay | Admitting: Cardiology

## 2015-05-17 ENCOUNTER — Telehealth: Payer: Self-pay | Admitting: Family Medicine

## 2015-05-17 NOTE — Telephone Encounter (Signed)
S/w pt daughter, [name], who reports pt passed out last evening. Called EMS, vitals and BS WNL. She was not transported to ED. States pt feels "fine" today. She has appt w/Dr. Caryl Bis tomorrow and is inquiring of HM results. Holter monitor results have been scanned in pt chart and was reviewed by Dr. Caryl Comes at March 16 [place]. Advised daughter to continue to monitor, keep tomorrow's appt and f/u w/Dr. Ellyn Hack as scheduled March 29.  Dr. Caryl Bis should be able to see HM results for his review.  Unable to offer sooner appt as Dr. Ellyn Hack is out of the office all week. Daughter verbalized understanding and is agreeable w/plan.

## 2015-05-17 NOTE — Telephone Encounter (Signed)
Pt daughter states pt appeared to have stopped breathing and blinking eyes on 05/16/2015. EMS was called and they worked on her and she can back. Pulse is so weak. Pt has an appt on 05/20/2015. Daughter wants to know if they're is something else she can do until appt or does she need to come in sooner? Daughter has a hard time getting her up early. Call daughter [phone]. Thank you!

## 2015-05-17 NOTE — Telephone Encounter (Signed)
Pt daughter states last night pt went to take a shower, got out and stated she "feels funny". She then passed out.  Her daughter called 911, and Dr. Caryl Bis is going to see her tomorrow and needs her holter results. She states pt has not received monitor results herself.  States she also needs to be seen before next week. Please call.

## 2015-05-17 NOTE — Telephone Encounter (Signed)
Please advise, thanks.

## 2015-05-17 NOTE — Telephone Encounter (Signed)
Patient is to follow-up in the office. Appointment has been scheduled for tomorrow. Advised that they call cardiology to discuss her Holter monitor results.

## 2015-05-18 ENCOUNTER — Ambulatory Visit
Admission: RE | Admit: 2015-05-18 | Discharge: 2015-05-18 | Disposition: A | Discharge status: Home / Self Care | Payer: Medicare Other | Source: Ambulatory Visit | Attending: Family Medicine | Admitting: Family Medicine

## 2015-05-18 ENCOUNTER — Ambulatory Visit (INDEPENDENT_AMBULATORY_CARE_PROVIDER_SITE_OTHER): Payer: Medicare Other | Admitting: Family Medicine

## 2015-05-18 ENCOUNTER — Encounter: Payer: Self-pay | Admitting: Family Medicine

## 2015-05-18 ENCOUNTER — Telehealth: Payer: Self-pay | Admitting: Family Medicine

## 2015-05-18 VITALS — BP 144/88 | HR 70 | Temp 98.0°F | Ht 64.0 in | Wt 177.6 lb

## 2015-05-18 DIAGNOSIS — R55 Syncope and collapse: Secondary | ICD-10-CM | POA: Diagnosis not present

## 2015-05-18 DIAGNOSIS — I6623 Occlusion and stenosis of bilateral posterior cerebral arteries: Secondary | ICD-10-CM | POA: Diagnosis not present

## 2015-05-18 DIAGNOSIS — I679 Cerebrovascular disease, unspecified: Secondary | ICD-10-CM | POA: Insufficient documentation

## 2015-05-18 DIAGNOSIS — R29810 Facial weakness: Secondary | ICD-10-CM | POA: Insufficient documentation

## 2015-05-18 DIAGNOSIS — R7989 Other specified abnormal findings of blood chemistry: Secondary | ICD-10-CM | POA: Diagnosis not present

## 2015-05-18 DIAGNOSIS — I638 Other cerebral infarction: Secondary | ICD-10-CM | POA: Diagnosis not present

## 2015-05-18 DIAGNOSIS — I639 Cerebral infarction, unspecified: Secondary | ICD-10-CM | POA: Diagnosis not present

## 2015-05-18 DIAGNOSIS — I6523 Occlusion and stenosis of bilateral carotid arteries: Secondary | ICD-10-CM | POA: Insufficient documentation

## 2015-05-18 LAB — COMPREHENSIVE METABOLIC PANEL
ALBUMIN: 4.2 g/dL (ref 3.5–5.2)
ALT: 15 U/L (ref 0–35)
AST: 17 U/L (ref 0–37)
Alkaline Phosphatase: 63 U/L (ref 39–117)
BUN: 25 mg/dL — AB (ref 6–23)
CHLORIDE: 102 meq/L (ref 96–112)
CO2: 29 meq/L (ref 19–32)
CREATININE: 1.27 mg/dL — AB (ref 0.40–1.20)
Calcium: 9.9 mg/dL (ref 8.4–10.5)
GFR: 42.65 mL/min — ABNORMAL LOW (ref >60.00)
Glucose, Bld: 102 mg/dL — ABNORMAL HIGH (ref 70–99)
POTASSIUM: 4.6 meq/L (ref 3.5–5.1)
SODIUM: 140 meq/L (ref 135–145)
Total Bilirubin: 0.3 mg/dL (ref 0.2–1.2)
Total Protein: 7 g/dL (ref 6.0–8.3)

## 2015-05-18 LAB — LIPID PANEL
CHOL/HDL RATIO: 4
Cholesterol: 212 mg/dL — ABNORMAL HIGH (ref 0–200)
HDL: 54.2 mg/dL (ref >39.00)
NonHDL: 157.46
Triglycerides: 225 mg/dL — ABNORMAL HIGH (ref 0.0–149.0)
VLDL: 45 mg/dL — AB (ref 0.0–40.0)

## 2015-05-18 LAB — LDL CHOLESTEROL, DIRECT: LDL DIRECT: 106 mg/dL

## 2015-05-18 LAB — HEMOGLOBIN A1C: Hgb A1c MFr Bld: 8.4 % — ABNORMAL HIGH (ref 4.6–6.5)

## 2015-05-18 NOTE — Telephone Encounter (Signed)
I called and spoke to the patient and her daughter. I informed of the MRI results and advised that there was evidence of a subacute stroke on imaging. Discussed that this likely meant that she had an event in the last several weeks and was less likely related to the event over the weekend, especially given quick resolution of symptoms. Discussed that we would need the patient to follow-up with neurology and we would need to aggressively manage her chronic medical issues. We will increase her crestor to 40 mg daily. Discussed diabetes control and it was noted by the daughter that her A1c was 9.1 when previously checked 1 month ago by her endocrinologist. It has improved to 8.4 over the last month. Also reports her cbg ranges have been 125-180 at home. Again I discussed that the episode the patient had on Sunday could have been related to a TIA or arrhythmia and given resolution it would be less likely related to lesion found on imaging today. Patient was asymptomatic in the office today and had been at her baseline since shortly after the event on Sunday. Discussed return precautions at length with the patient. She voiced understanding.

## 2015-05-18 NOTE — Progress Notes (Signed)
Patient ID: Abigail Boyd, female   DOB: Oct 06, 1929, 82 y.o.   MRN: [id number]  Tommi Rumps, MD Phone: [phone]  Abigail Boyd is a 82 y.o. female who presents today for same-day visit.  Patient notes Sunday evening she was in the shower got up and dried off and then was sitting on a stool in the bathroom with her daughter applying lotion to her legs when she suddenly felt funny. Noted some lightheadedness. Seemed as though she was not breathing to the daughter. No preceding palpitations, chest pain, or shortness of breath. No she felt overall weak afterwards though no focal weakness. No numbness. The patient's daughter notes the granddaughter possibly noted some right-sided facial droop with this. They called EMS and the patient refused to go to the emergency room for evaluation as she felt fine after EMS got to their house. She notes this is the second time this has occurred in the last 3 weeks. She does have a history of stroke. She's also had a history of tachybradycardia syndrome and paroxysmal A. fib. She is on Crestor. She is on xarelto. Her blood pressures have been well controlled previously. They report her blood sugar has not been too low and this occurs and was in the upper 100s. She reports she feels well this time. She has no persistent neurological deficits currently.  PMH: nonsmoker.   ROS see history of present illness  Objective  Physical Exam Filed Vitals:   05/18/15 1128 05/18/15 1204  BP: 158/82 144/88  Pulse: 70   Temp: 98 F (36.7 C)     BP Readings from Last 3 Encounters:  05/18/15 144/88  05/13/15 117/76  04/26/15 132/70   Wt Readings from Last 3 Encounters:  05/18/15 177 lb 9.6 oz (80.559 kg)  05/13/15 178 lb 6.4 oz (80.922 kg)  04/26/15 174 lb 9.6 oz (79.198 kg)    Physical Exam  Constitutional: She is well-developed, well-nourished, and in no distress.  HENT:  Head: Normocephalic and atraumatic.  Right Ear: External ear normal.  Left Ear:  External ear normal.  Mouth/Throat: Oropharynx is clear and moist. No oropharyngeal exudate.  Eyes: Conjunctivae are normal. Pupils are equal, round, and reactive to light.  Cardiovascular: Normal rate, regular rhythm and normal heart sounds.  Exam reveals no gallop and no friction rub.   No murmur heard. Pulmonary/Chest: Effort normal and breath sounds normal. No respiratory distress. She has no wheezes. She has no rales.  Neurological: She is alert.  CN 2-12 intact, 5/5 strength in bilateral biceps, triceps, grip, quads, hamstrings, plantar and dorsiflexion, sensation to light touch intact in bilateral UE and LE, deliberate gait with aid of walker, 2+ patellar reflexes  Skin: Skin is warm and dry. She is not diaphoretic.   EKG: Normal sinus rhythm, rate 64, RSR prime in V1, no ST or T-wave changes  Assessment/Plan: Please see individual problem list.  Syncope Patient with recurrent episode of syncope. No preceding symptoms. Possibly her granddaughter noted some right-sided facial droop with this episode and this would be concerning for TIA given this is improved. Could potentially also be related to arrhythmia and she already has follow-up with cardiology. EKG today is reassuring. Given potential of facial droop we will obtain MRI/MRA of her brain to evaluate further. She is already on anticoagulation for A. fib so doubt this is the cause. We'll check lipid panel, A1c, and CMP as well for other risk factor modifications. She is neurologically intact right now and has returned to baseline  quickly following this. Unlikely to be CVA though will rule out with MRI. Discussed that if she has any recurrence of these symptoms she needs to follow EMSs advice and go to the emergency room for evaluation. She is given return precautions.    Orders Placed This Encounter  Procedures  . MR Brain Wo Contrast    Standing Status: Future     Number of Occurrences:      Standing Expiration Date: 07/17/2016     Order Specific Question:  Reason for Exam (SYMPTOM  OR DIAGNOSIS REQUIRED)    Answer:  right sided facial droop and syncope 2 days ago, symptoms resolved quickly    Order Specific Question:  Preferred imaging location?    Answer:  [facility] (table limit-300lbs)    Order Specific Question:  What is the patient's sedation requirement?    Answer:  No Sedation    Order Specific Question:  Does the patient have a pacemaker or implanted devices?    Answer:  Yes    Order Specific Question:  Manufacturer of pacemake or implanted device?    Answer:  patient reports she has a stent in her chest  . MR MRA Head/Brain Wo Cm    Standing Status: Future     Number of Occurrences:      Standing Expiration Date: 07/17/2016    Order Specific Question:  Reason for Exam (SYMPTOM  OR DIAGNOSIS REQUIRED)    Answer:  right sided facial droop and syncope 2 days ago    Order Specific Question:  Preferred imaging location?    Answer:  [facility] (table limit-300lbs)    Order Specific Question:  What is the patient's sedation requirement?    Answer:  No Sedation    Order Specific Question:  Does the patient have a pacemaker or implanted devices?    Answer:  Yes    Order Specific Question:  Manufacturer of pacemake or implanted device?    Answer:  patient reports she has a stent in her chest  . HgB A1c  . Lipid Profile  . Comp Met (CMET)  . EKG 12-Lead    Tommi Rumps, MD [place]

## 2015-05-18 NOTE — Progress Notes (Signed)
Pre visit review using our clinic review tool, if applicable. No additional management support is needed unless otherwise documented below in the visit note. 

## 2015-05-18 NOTE — Telephone Encounter (Signed)
Dr.Cook has called patients daughter with results from MRI and MRA. Please follow up with patient on these results. Dr.Cook stated her results and told daughter to continue medication and follow up on with PCP

## 2015-05-18 NOTE — Patient Instructions (Signed)
Nice to see you. We will obtain imaging of your brain to evaluate further. If you have recurrence of passing out, or develop numbness, weakness, change in speech, facial droop, or any new or changing symptoms please seek medical attention.

## 2015-05-18 NOTE — Assessment & Plan Note (Addendum)
Patient with recurrent episode of syncope. No preceding symptoms. Possibly her granddaughter noted some right-sided facial droop with this episode and this would be concerning for TIA given this is improved. Could potentially also be related to arrhythmia and she already has follow-up with cardiology. EKG today is reassuring. Given potential of facial droop we will obtain MRI/MRA of her brain to evaluate further. She is already on anticoagulation for A. fib so doubt this is the cause. We'll check lipid panel, A1c, and CMP as well for other risk factor modifications. She is neurologically intact right now and has returned to baseline quickly following this. Unlikely to be CVA though will rule out with MRI. Discussed that if she has any recurrence of these symptoms she needs to follow EMSs advice and go to the emergency room for evaluation. She is given return precautions.

## 2015-05-20 ENCOUNTER — Ambulatory Visit: Payer: Medicare Other | Admitting: Family Medicine

## 2015-05-24 ENCOUNTER — Ambulatory Visit (INDEPENDENT_AMBULATORY_CARE_PROVIDER_SITE_OTHER): Payer: Medicare Other | Admitting: Neurology

## 2015-05-24 ENCOUNTER — Encounter: Payer: Self-pay | Admitting: Neurology

## 2015-05-24 VITALS — BP 125/75 | HR 65 | Ht 64.0 in

## 2015-05-24 DIAGNOSIS — I63431 Cerebral infarction due to embolism of right posterior cerebral artery: Secondary | ICD-10-CM

## 2015-05-24 DIAGNOSIS — Z8673 Personal history of transient ischemic attack (TIA), and cerebral infarction without residual deficits: Secondary | ICD-10-CM | POA: Insufficient documentation

## 2015-05-24 NOTE — Progress Notes (Signed)
[facility] [address] [place]. [place] [address] [phone]       OFFICE FOLLOW-UP NOTE  [name] Date of Birth:  [birth year] Medical Record Number:  [id number]   HPI: 64 year caucasian lady who is seen today for first office follow-up visit for hospital admission for stroke in November 2016. Abigail Boyd is an 82 y.o. female who was talking to her nephew mid-week and he noticed that her speech was slurred. She is unclear when this started but her daughter recalls talking to her the night of 12/22/14 at which time her speech was normal. Later in the day noticed numbness in her right hand. On th next day dropped a coffee pot that she was trying to hold with her right hand. Has also noticed that she was not moving her right leg as well. She did have some previous knee problems that limited her but feels things are worse. She did not want to come to the hospital but was encouraged by family to come today.  Date last known well: Date: 12/22/2014 Time last known well: Time: 23:00 tPA Given: No: Outside time window.  CT scan of the head on admission showed a low-density in the left supratentorial brain and subcortical white matter suggestive of an infarct and this was confirmed on MRI which showed a 10 x 15 mm left frontal cortical and subcortical infarct. MRA of the brain showed no large vessel intracranial stenosis or occlusion. Carotid Doppler showed no extracranial stenosis. Transthoracic echo showed normal ejection fraction. Patient was on warfarin and INR on admission was optimal at 2.53. LDL cholesterol was 98 mg percent and hemoglobin A1c was 8.4. Patient was seen by physical occupational speech therapy and warfarin was changed to Xarelto for secondary stroke prevention. Patient states she's done well since discharge and she's had no bleeding or bruising. She is tolerating Xarelto well. She is living at home with her daughter. She has 24-hour care at home. She is  walking with physical and occupational therapies using a walker. She feels her right knee pain is the most limitation for walking. Update 05/24/2015 : She returns for follow-up after last visit 6 weeks ago. She has had 2 episodes of brief loss of consciousness within 1 week. She was seen at [facility] and I have reviewed the ER visit notes as well as imaging studies and cardiologist's office notes. These were felt to be vasovagal syncopal events. As patient did have a feeling of not feeling well before she passed out and loss of consciousness was only brief. There is no documented tongue bite, tonic-clonic activity or incontinence. MRI scan of the brain did not reveal any acute infarct but showed her subacute tiny right corpus callosal infarct which I feel is likely incidental and occurred despite being on anticoagulation with Xarelto. Patient's sister accompanies her today states that she's had previous episodes of brief loss of consciousness in the past as well. Patient had outpatient Holter monitor which showed a several runs of supraventricular tachycardia but no atrial fibrillation. She also had significant bradycardia episodes. She has seen cardiologist Dr. Caryl Comes and not been felt to need a pacemaker. Patient remains on Xarelto which is tolerating well without significant bruising or bleeding. She however takes it with supper which is not the biggest meal of the day. She is currently living with her daughter but does have a caregiver during the day and the daughters out working. She walks with a walker and is  careful and has had no recent falls. She is bothered mainly by her right knee which is quite painful. She is independent in activities of daily living. ROS:   14 system review of systems is positive for difficulty urinating, incontinence of bladder, decreased urination, walking difficulty and all other systems negative  PMH:  Past Medical History  Diagnosis Date  . History of  unstable angina 06/13/2011    Jaw pain awakening from sleep -- Myoview --CATH --> PCI  . Abnormal nuclear stress test 06/21/2011    Inferolateral reversible defect;--> cardiac cath & PCI of CxOM, occluded RCA with collaterals;; followup Myoview 11/2012: Low risk. Fixed basal inferior artifact normal EF. No ischemia  . CAD S/P percutaneous coronary angioplasty     s/P PCI to proximal OM1 w/ DES; occluded RCA with bridging and L-R collaterals (medical management)  . Shortness of breath on exertion October 2013    2-D echo: Normal EF>55%, Gr 1DD, mild aortic sclerosis; Evaluated with CPET - peak VO2 97%; Chronotropic Incompetence (submaximal effort)  . Chronotropic incompetence with sinus node dysfunction [facility]) October 2013    On CPET test; beta blockers reduced  . PAF (paroxysmal atrial fibrillation) ([place]) 10-11/ 2014    CardioNet Event Monitor: NSR & S Brady -- Rates 50-100; Total A. fib burden 35 hours and 27 minutes. 1296 episodes, longest was 1 hour 29 minutes. Rate ranged from 52-169 beats per minute.  . Tachycardia-bradycardia syndrome ([place]) 11/2012  . History of syncope     Per EP - neurocardiogenic & not Bradycardia related (no PPM)  . OSA (obstructive sleep apnea)     hx bladder infections  . Diabetes mellitus type 2, controlled ([place])     On oral medications  . Essential hypertension     Allowing for permissive hypertension to avoid orthostatic hypotension  . Hyperlipidemia with target LDL less than 70     HDL at goal, LDL not at goal, borderline triglycerides. On Crestor 20 mg  . Chronic kidney disease (CKD), stage II (mild)     Related to current bladder infections (although diabetes cannot be excluded)  . Spinal stenosis of lumbar region 11/2011  . Osteoarthritis   . GERD (gastroesophageal reflux disease)     On PPI  . Seasonal allergies   . Anxiety     When necessary Xanax  . Glaucoma   . Bilateral cataracts     Status post stroke or correction  . Diverticulitis   .  Migraines   . Stroke ([place])   . Urge incontinence     Social History:  Social History   Social History  . Marital Status: Widowed    Spouse Name: N/A  . Number of Children: N/A  . Years of Education: N/A   Occupational History  . retired    Social History Main Topics  . Smoking status: Never Smoker   . Smokeless tobacco: Never Used  . Alcohol Use: No  . Drug Use: No  . Sexual Activity: No   Other Topics Concern  . Not on file   Social History Narrative   Widowed mother of one, grandmother 2. Has not been using silver take as much lately due to knee discomfort. Usually exercises with Silver Sneakers on regular basis.    Medications:   Current Outpatient Prescriptions on File Prior to Visit  Medication Sig Dispense Refill  . acetaminophen (TYLENOL) 650 MG CR tablet Take 1,300 mg by mouth 2 (two) times daily as needed for pain.    Marland Kitchen  ALPRAZolam (XANAX) 0.25 MG tablet Take 1 tablet (0.25 mg total) by mouth at bedtime as needed for sleep or anxiety. 30 tablet 0  . BACLOFEN PO Take 5mg  by mouth daily for muscle spasm    . cholecalciferol (VITAMIN D) 1000 UNITS tablet Take 1,000 Units by mouth daily.    Marland Kitchen docusate sodium (STOOL SOFTENER) 100 MG capsule Take 100 mg by mouth daily.     . fexofenadine (ALLEGRA) 180 MG tablet Take 180 mg by mouth at bedtime.     . fish oil-omega-3 fatty acids 1000 MG capsule Take 1 g by mouth daily.     . furosemide (LASIX) 20 MG tablet Take 1 tablet (20 mg total) by mouth daily as needed. Once daily as needed for swelling 30 tablet 3  . Insulin Glargine (LANTUS) 100 UNIT/ML Solostar Pen Inject 48 Units into the skin.    Marland Kitchen insulin lispro (HUMALOG KWIKPEN) 100 UNIT/ML KiwkPen Inject 1 Units into the skin 3 (three) times daily as needed (CBG >200).    Marland Kitchen ipratropium (ATROVENT) 0.06 % nasal spray Place 1 spray into the nose daily.     Marland Kitchen latanoprost (XALATAN) 0.005 % ophthalmic solution Instill one drop into each eye every night at bedtime    .  levocetirizine (XYZAL) 5 MG tablet Take 5 mg by mouth every evening.     Marland Kitchen LUMIGAN 0.01 % SOLN Place 1 drop into both eyes at bedtime.     . Magnesium 500 MG CAPS Take 500 mg by mouth daily as needed (constipation).     . metoprolol tartrate (LOPRESSOR) 25 MG tablet Take 1 tablet (25 mg total) by mouth 2 (two) times daily. HOLD for SBP <110 and HR < 60 60 tablet 6  . mirabegron ER (MYRBETRIQ) 50 MG TB24 tablet Take one tablet by mouth once daily for kidneys    . nitroGLYCERIN (NITROSTAT) 0.4 MG SL tablet Place 0.4 mg under the tongue every 5 (five) minutes as needed for chest pain.    Marland Kitchen nystatin cream (MYCOSTATIN) Apply 1 application topically daily.  1  . omeprazole (PRILOSEC) 20 MG capsule Take 20 mg by mouth daily.     . ONE TOUCH ULTRA TEST test strip 1 each by Other route as needed.     Marland Kitchen PARoxetine (PAXIL) 10 MG tablet Take 10 mg by mouth daily.     . Rivaroxaban (XARELTO) 15 MG TABS tablet Take 15 mg by mouth daily with supper.    Marland Kitchen rOPINIRole (REQUIP) 0.5 MG tablet Take 1 tablet (0.5 mg total) by mouth at bedtime. 30 tablet 2  . senna-docusate (SENOKOT-S) 8.6-50 MG tablet Take 1 tablet by mouth at bedtime for constipation    . sitaGLIPtin (JANUVIA) 50 MG tablet Take 50 mg by mouth at bedtime.     . VESICARE 5 MG tablet TAKE 1 TABLET (5 MG TOTAL) BY MOUTH ONCE DAILY.  11   No current facility-administered medications on file prior to visit.    Allergies:   Allergies  Allergen Reactions  . Lisinopril Cough  . Losartan Cough  . Sulfa Antibiotics Hives    Physical Exam General: well developed, well nourished elderly Caucasian lady, seated in a wheelchair, in no evident distress Head: head normocephalic and atraumatic.  Neck: supple with no carotid or supraclavicular bruits Cardiovascular: regular rate and rhythm, no murmurs Musculoskeletal: no deformity Skin:  no rash/petichiae Vascular:  Normal pulses all extremities Filed Vitals:   05/24/15 1310  BP: 125/75  Pulse: 65  Neurologic Exam Mental Status: Awake and fully alert. Oriented to place and time. Recent and remote memory intact. Attention span, concentration and fund of knowledge appropriate. Mood and affect appropriate. Speech is mostly fluent without occasional word finding difficulties and hesitancy. Cranial Nerves: Fundoscopic exam reveals sharp disc margins. Pupils equal, briskly reactive to light. Extraocular movements full without nystagmus. Visual fields full to confrontation. Hearing intact. Facial sensation intact. Face, tongue, palate moves normally and symmetrically.  Motor: Normal bulk and tone. Normal strength in all tested extremity muscles. Sensory.: intact to touch ,pinprick .position and vibratory sensation.  Coordination: Rapid alternating movements normal in all extremities. Finger-to-nose and heel-to-shin performed accurately bilaterally. Gait and Station: Arises from chair with slight difficulty. Stance is normal. Gait demonstrates normal stride length and balance but favors right knee due to pain.  Reflexes: 1+ and symmetric. Toes downgoing.   NIHSS 0 Modified Rankin  1   ASSESSMENT: 63 year lady with embolic left MCA branch infarct secondary to atrial fibrillation in November 2016 despite optimal anticoagulation with warfarin. She has been changed to Xarelto and is doing well. Recent episodes of brief loss of consciousness likely vasovagal syncopal events doubt seizures. Recent silent subacute infarct on MRI scan    PLAN: I had a long d/w patient and sister about her recent  silent stroke,atrial fibrillation, syncope, risk for recurrent stroke/TIAs, personally independently reviewed imaging studies and stroke evaluation results and answered questions.Continue Xarelto for secondary stroke prevention and maintain strict control of hypertension with blood pressure goal below 130/90, diabetes with hemoglobin A1c goal below 6.5% and lipids with LDL cholesterol goal below 70 mg/dL. we  also discussed the lack of comparative data between Xarelto, Pradaxa and eliquis head to head and hence cannot recommend switching anticoagulants but recommend she take Xarelto with heavy meal consistently. I also advised the patient to eat a healthy diet with plenty of whole grains, cereals, fruits and vegetables, exercise regularly and maintain ideal body weight .check EEG for seizure activity. Greater than 50% of time during this 25 minute visit was spent on counseling and coordination of care Followup in the future with stroke NP in 6 months or call earlier if necessary Antony Contras, MD  Note: This document was prepared with digital dictation and possible smart phrase technology. Any transcriptional errors that result from this process are unintentional

## 2015-05-24 NOTE — Patient Instructions (Signed)
I had a long d/w patient and sister about her recent  silent stroke,atrial fibrillation, syncope, risk for recurrent stroke/TIAs, personally independently reviewed imaging studies and stroke evaluation results and answered questions.Continue Xarelto for secondary stroke prevention and maintain strict control of hypertension with blood pressure goal below 130/90, diabetes with hemoglobin A1c goal below 6.5% and lipids with LDL cholesterol goal below 70 mg/dL. we also discussed the lack of comparative data between Xarelto, Pradaxa and eliquis head to head and hence cannot recommend switching anticoagulants but recommend she take Xarelto with heavy meal consistently. I also advised the patient to eat a healthy diet with plenty of whole grains, cereals, fruits and vegetables, exercise regularly and maintain ideal body weight .check EEG for seizure activity. Followup in the future with stroke NP in 6 months or call earlier if necessary Stroke Prevention Some medical conditions and behaviors are associated with an increased chance of having a stroke. You may prevent a stroke by making healthy choices and managing medical conditions. HOW CAN I REDUCE MY RISK OF HAVING A STROKE?   Stay physically active. Get at least 30 minutes of activity on most or all days.  Do not smoke. It may also be helpful to avoid exposure to secondhand smoke.  Limit alcohol use. Moderate alcohol use is considered to be:  No more than 2 drinks per day for men.  No more than 1 drink per day for nonpregnant women.  Eat healthy foods. This involves:  Eating 5 or more servings of fruits and vegetables a day.  Making dietary changes that address high blood pressure (hypertension), high cholesterol, diabetes, or obesity.  Manage your cholesterol levels.  Making food choices that are high in fiber and low in saturated fat, trans fat, and cholesterol may control cholesterol levels.  Take any prescribed medicines to control  cholesterol as directed by your health care provider.  Manage your diabetes.  Controlling your carbohydrate and sugar intake is recommended to manage diabetes.  Take any prescribed medicines to control diabetes as directed by your health care provider.  Control your hypertension.  Making food choices that are low in salt (sodium), saturated fat, trans fat, and cholesterol is recommended to manage hypertension.  Ask your health care provider if you need treatment to lower your blood pressure. Take any prescribed medicines to control hypertension as directed by your health care provider.  If you are 1-29 years of age, have your blood pressure checked every 3-5 years. If you are 66 years of age or older, have your blood pressure checked every year.  Maintain a healthy weight.  Reducing calorie intake and making food choices that are low in sodium, saturated fat, trans fat, and cholesterol are recommended to manage weight.  Stop drug abuse.  Avoid taking birth control pills.  Talk to your health care provider about the risks of taking birth control pills if you are over 71 years old, smoke, get migraines, or have ever had a blood clot.  Get evaluated for sleep disorders (sleep apnea).  Talk to your health care provider about getting a sleep evaluation if you snore a lot or have excessive sleepiness.  Take medicines only as directed by your health care provider.  For some people, aspirin or blood thinners (anticoagulants) are helpful in reducing the risk of forming abnormal blood clots that can lead to stroke. If you have the irregular heart rhythm of atrial fibrillation, you should be on a blood thinner unless there is a good reason you cannot  take them.  Understand all your medicine instructions.  Make sure that other conditions (such as anemia or atherosclerosis) are addressed. SEEK IMMEDIATE MEDICAL CARE IF:   You have sudden weakness or numbness of the face, arm, or leg,  especially on one side of the body.  Your face or eyelid droops to one side.  You have sudden confusion.  You have trouble speaking (aphasia) or understanding.  You have sudden trouble seeing in one or both eyes.  You have sudden trouble walking.  You have dizziness.  You have a loss of balance or coordination.  You have a sudden, severe headache with no known cause.  You have new chest pain or an irregular heartbeat. Any of these symptoms may represent a serious problem that is an emergency. Do not wait to see if the symptoms will go away. Get medical help at once. Call your local emergency services (911 in [place] Do not drive yourself to the hospital.   This information is not intended to replace advice given to you by your health care provider. Make sure you discuss any questions you have with your health care provider.   Document Released: 03/23/2004 Document Revised: 03/06/2014 Document Reviewed: 08/16/2012 Elsevier Interactive Patient Education [employer].

## 2015-05-26 ENCOUNTER — Telehealth: Payer: Self-pay | Admitting: Cardiology

## 2015-05-26 ENCOUNTER — Ambulatory Visit (INDEPENDENT_AMBULATORY_CARE_PROVIDER_SITE_OTHER): Payer: Medicare Other | Admitting: Cardiology

## 2015-05-26 ENCOUNTER — Encounter: Payer: Self-pay | Admitting: Cardiology

## 2015-05-26 ENCOUNTER — Ambulatory Visit: Payer: Medicare Other | Admitting: Cardiology

## 2015-05-26 VITALS — BP 146/75 | HR 73 | Ht 64.0 in | Wt 181.0 lb

## 2015-05-26 DIAGNOSIS — Z7901 Long term (current) use of anticoagulants: Secondary | ICD-10-CM

## 2015-05-26 DIAGNOSIS — E785 Hyperlipidemia, unspecified: Secondary | ICD-10-CM

## 2015-05-26 DIAGNOSIS — R55 Syncope and collapse: Secondary | ICD-10-CM | POA: Diagnosis not present

## 2015-05-26 DIAGNOSIS — I495 Sick sinus syndrome: Secondary | ICD-10-CM

## 2015-05-26 DIAGNOSIS — Z9861 Coronary angioplasty status: Secondary | ICD-10-CM

## 2015-05-26 DIAGNOSIS — I480 Paroxysmal atrial fibrillation: Secondary | ICD-10-CM

## 2015-05-26 DIAGNOSIS — I10 Essential (primary) hypertension: Secondary | ICD-10-CM

## 2015-05-26 DIAGNOSIS — R600 Localized edema: Secondary | ICD-10-CM

## 2015-05-26 DIAGNOSIS — I2510 Atherosclerotic heart disease of native coronary artery without angina pectoris: Secondary | ICD-10-CM | POA: Diagnosis not present

## 2015-05-26 DIAGNOSIS — I951 Orthostatic hypotension: Secondary | ICD-10-CM

## 2015-05-26 MED ORDER — METOPROLOL TARTRATE 25 MG PO TABS: 25.0000 mg | ORAL_TABLET | Freq: Two times a day (BID) | ORAL | Status: DC

## 2015-05-26 MED ORDER — ROSUVASTATIN CALCIUM 40 MG PO TABS: 40.0000 mg | ORAL_TABLET | Freq: Every day | ORAL | Status: DC

## 2015-05-26 NOTE — Telephone Encounter (Signed)
Pt in the office for appt and asks if she may restart Heart Track. Per verbal from Dr. Ellyn Hack, pt may resume Heart Track.

## 2015-05-26 NOTE — Progress Notes (Signed)
PCP: Tommi Rumps, MD  Clinic Note: Chief Complaint  Patient presents with  . Follow-up    6 month f/u c/o syncope and low BP. Meds reviewed verbally with pt.  . Coronary Artery Disease  . Atrial Fibrillation    With tachybradycardia  . Loss of Consciousness    HPI: Abigail Boyd is a 82 y.o. female with a PMH below who presents today for f/u of CAD-PCI, Afib (with bradycardia issues) & recent episodes of syncope.  Abigail Boyd was last seen on 43/16/'17 by Dr. Caryl Comes @ my request to f/u for her recent syncopal episodes given her h/o of bradycardia with pauses.  Initially, her episodes were felt to be neurally mediated - back in 2015. Her most recent episodes was while while she was trying to spit up for a DNA test; she has a dry mouth and this was a very vigorous and repeated activity. She also was seen at [facility] for an episode of syncope that occurred in the endocrine clinic at which point her blood pressure was measured to be 80. She is seen by cardiology as well as EP - & the episodes were believed to be neurally / vasovagal mediated (low SBPs in 50-60s) -- Discussed prodromal awareness.   Now also being seen by Dr. Leonie Man following her CVA last fall.  Recent Hospitalizations:   [facility] for Syncope recently  11/2014 for CVA  Studies Reviewed:   Holter monitor was reviewed 3/17 demonstrated sinus rhythm with some atrial fibrillation post termination pause was noted that was not significant  Echo 11/2014: EF 65-70%, Mod LVH. Normal wall motion. Gr 1 DD. Normal valves.  Interval History: She presents today doing otherwise well.  Further episodes of any syncope since seeing Dr. Caryl Comes. She does occasionally feel dizzy when she stands up quickly. She is not noticed any rapid or irregular heartbeats. No significant bleeding episodes but just has occasional hemorrhoidal bleed. She denies any anginal symptoms at rest or exertion. No exertional dyspnea. She is somewhat limited as much she  walks by balance issues.  No chest pain or shortness of breath with rest or exertion.  No PND, orthopnea or edema.  No palpitations, No TIA/amaurosis fugax symptoms. No melena, hematochezia, hematuria, or epstaxis. No claudication.  ROS: A comprehensive was performed. Review of Systems  Constitutional: Negative for malaise/fatigue.  HENT: Negative for nosebleeds.   Respiratory: Negative for cough, shortness of breath and wheezing.   Gastrointestinal: Negative for blood in stool and melena.  Genitourinary: Negative for hematuria.  Neurological: Positive for dizziness (Positional) and loss of consciousness (See history of present illness).  Endo/Heme/Allergies: Does not bruise/bleed easily.  Psychiatric/Behavioral: Positive for memory loss. Negative for depression.  All other systems reviewed and are negative.   Past Medical History  Diagnosis Date  . History of unstable angina 06/13/2011    Jaw pain awakening from sleep -- Myoview --CATH --> PCI  . Abnormal nuclear stress test 06/21/2011    Inferolateral reversible defect;--> cardiac cath & PCI of CxOM, occluded RCA with collaterals;; followup Myoview 11/2012: Low risk. Fixed basal inferior artifact normal EF. No ischemia  . CAD S/P percutaneous coronary angioplasty     s/P PCI to proximal OM1 w/ DES; occluded RCA with bridging and L-R collaterals (medical management)  . Shortness of breath on exertion October 2013    2-D echo: Normal EF>55%, Gr 1DD, mild aortic sclerosis; Evaluated with CPET - peak VO2 97%; Chronotropic Incompetence (submaximal effort)  . Chronotropic incompetence with sinus  node dysfunction [facility]) October 2013    On CPET test; beta blockers reduced  . PAF (paroxysmal atrial fibrillation) ([place]) 10-11/ 2014    CardioNet Event Monitor: NSR & S Brady -- Rates 50-100; Total A. fib burden 35 hours and 27 minutes. 1296 episodes, longest was 1 hour 29 minutes. Rate ranged from 52-169 beats per minute.  .  Tachycardia-bradycardia syndrome ([place]) 11/2012  . History of syncope     Per EP - neurocardiogenic & not Bradycardia related (no PPM)  . OSA (obstructive sleep apnea)     hx bladder infections  . Diabetes mellitus type 2, controlled ([place])     On oral medications  . Essential hypertension     Allowing for permissive hypertension to avoid orthostatic hypotension  . Hyperlipidemia with target LDL less than 70     HDL at goal, LDL not at goal, borderline triglycerides. On Crestor 20 mg  . Chronic kidney disease (CKD), stage II (mild)     Related to current bladder infections (although diabetes cannot be excluded)  . Spinal stenosis of lumbar region 11/2011  . Osteoarthritis   . GERD (gastroesophageal reflux disease)     On PPI  . Seasonal allergies   . Anxiety     When necessary Xanax  . Glaucoma   . Bilateral cataracts     Status post stroke or correction  . Diverticulitis   . Migraines   . Stroke ([place])   . Urge incontinence     Past Surgical History  Procedure Laterality Date  . Cataracts      both eyes  . Colonoscopy    . Vaginal hysterectomy    . Breast biopsy      both breast  . Cardiac catheterization  07/05/2011    Unstable Angina --> Myoview wiht Inf-Lat Ischemia.  Proximal OM1 lesion --> PCI; 100% mid RCA with right to right and left to right bridging collaterals from circumflex RPL and LAD the PDA.  Marland Kitchen Coronary angioplasty with stent placement  07/05/2011    Promus Element DES 2.5 mm x 16 mm-post dilated to 2.65 mm CX-Proximal OM1  . Nm myoview ltd  October 2014     Low risk. Fixed basal inferior artifact normal EF. No ischemia --> (as compared to pre-PCI Myoview revealing inferolateral ischemia)  . Left heart catheterization with coronary angiogram N/A 07/06/2011    Procedure: LEFT HEART CATHETERIZATION WITH CORONARY ANGIOGRAM;  Surgeon: Leonie Man, MD;  Location: [facility] CATH LAB;  Service: Cardiovascular;  Laterality: N/A;  . Knee arthroscopy with medial menisectomy  Right 06/24/2014    Procedure: RIGHT KNEE ARTHROSCOPY WITH partial lateral MENISECTOMY, abrasion chondroplasty of medial femoral condyle and patella, microfracture technique;  Surgeon: Latanya Maudlin, MD;  Location: [facility] ORS;  Service: Orthopedics;  Laterality: Right;  . Appendectomy    . Transthoracic echocardiogram  11/2014    EF 65-70%, Mod LVH. Normal wall motion. Gr 1 DD. Normal valves.   Prior to Admission medications   Medication Sig Start Date End Date Taking? Authorizing Provider  acetaminophen (TYLENOL) 650 MG CR tablet Take 1,300 mg by mouth 2 (two) times daily as needed for pain.   Yes Historical Provider, MD  ALPRAZolam (XANAX) 0.25 MG tablet Take 1 tablet (0.25 mg total) by mouth at bedtime as needed for sleep or anxiety. 12/29/14  Yes Nita Sells, MD  cholecalciferol (VITAMIN D) 1000 UNITS tablet Take 1,000 Units by mouth daily.   Yes Historical Provider, MD  docusate sodium (STOOL SOFTENER) 100 MG  capsule Take 100 mg by mouth daily.    Yes Historical Provider, MD  fexofenadine (ALLEGRA) 180 MG tablet Take 180 mg by mouth at bedtime.    Yes Historical Provider, MD  fish oil-omega-3 fatty acids 1000 MG capsule Take 1 g by mouth daily.    Yes Historical Provider, MD  furosemide (LASIX) 20 MG tablet Take 1 tablet (20 mg total) by mouth daily as needed. Once daily as needed for swelling 03/10/15  Yes Leonie Man, MD  Insulin Glargine (LANTUS) 100 UNIT/ML Solostar Pen Inject 50 Units into the skin.  03/31/15  Yes Historical Provider, MD  insulin lispro (HUMALOG KWIKPEN) 100 UNIT/ML KiwkPen Inject 10 Units into the skin 3 (three) times daily as needed (CBG >200).    Yes Historical Provider, MD  ipratropium (ATROVENT) 0.06 % nasal spray Place 1 spray into the nose daily.  12/08/10  Yes Historical Provider, MD  latanoprost (XALATAN) 0.005 % ophthalmic solution Instill one drop into each eye every night at bedtime   Yes Historical Provider, MD  levocetirizine (XYZAL) 5 MG tablet Take 5  mg by mouth every evening.  03/22/15  Yes Historical Provider, MD  LUMIGAN 0.01 % SOLN Place 1 drop into both eyes at bedtime.  03/06/15  Yes Historical Provider, MD  Magnesium 500 MG CAPS Take 500 mg by mouth daily as needed (constipation).    Yes Historical Provider, MD  metoprolol tartrate (LOPRESSOR) 25 MG tablet Take 1 tablet (25 mg total) by mouth 2 (two) times daily. HOLD for SBP <110 and HR < 60 04/26/15  Yes Rhonda G Barrett, PA-C  mirabegron ER (MYRBETRIQ) 50 MG TB24 tablet Take one tablet by mouth once daily for kidneys   Yes Historical Provider, MD  nitroGLYCERIN (NITROSTAT) 0.4 MG SL tablet Place 0.4 mg under the tongue every 5 (five) minutes as needed for chest pain.   Yes Historical Provider, MD  nystatin cream (MYCOSTATIN) Apply 1 application topically daily. 12/03/14  Yes Historical Provider, MD  omeprazole (PRILOSEC) 20 MG capsule Take 20 mg by mouth daily.  02/28/15  Yes Historical Provider, MD  ONE TOUCH ULTRA TEST test strip 1 each by Other route as needed.  03/18/15  Yes Historical Provider, MD  PARoxetine (PAXIL) 10 MG tablet Take 10 mg by mouth daily.  02/28/15  Yes Historical Provider, MD  Rivaroxaban (XARELTO) 15 MG TABS tablet Take 15 mg by mouth daily with supper.   Yes Historical Provider, MD  rOPINIRole (REQUIP) 0.5 MG tablet Take 1 tablet (0.5 mg total) by mouth at bedtime. 05/05/15  Yes Leone Haven, MD  rosuvastatin (CRESTOR) 40 MG tablet Take 40 mg by mouth daily.   Yes Historical Provider, MD  sitaGLIPtin (JANUVIA) 50 MG tablet Take 50 mg by mouth at bedtime.    Yes Historical Provider, MD  VESICARE 5 MG tablet TAKE 1 TABLET (5 MG TOTAL) BY MOUTH ONCE DAILY. 03/30/15  Yes Historical Provider, MD   Allergies  Allergen Reactions  . Lisinopril Cough  . Losartan Cough  . Sulfa Antibiotics Hives     Social History   Social History  . Marital Status: Widowed    Spouse Name: N/A  . Number of Children: N/A  . Years of Education: N/A   Occupational History  . retired     Social History Main Topics  . Smoking status: Never Smoker   . Smokeless tobacco: Never Used  . Alcohol Use: No  . Drug Use: No  . Sexual Activity: No  Other Topics Concern  . None   Social History Narrative   Widowed mother of one, grandmother 2. Has not been using silver take as much lately due to knee discomfort. Usually exercises with Silver Sneakers on regular basis.   Family History  Problem Relation Age of Onset  . Colon cancer Neg Hx   . Esophageal cancer Neg Hx   . Stomach cancer Neg Hx   . COPD Mother   . Heart attack Father   . Heart disease Father   . Cancer Sister   . Cancer Sister   . Pulmonary fibrosis Mother   . Clotting disorder Sister   . Heart attack Sister   . Stroke Father   . Arthritis    . Breast cancer Sister      Wt Readings from Last 3 Encounters:  05/26/15 181 lb (82.101 kg)  05/18/15 177 lb 9.6 oz (80.559 kg)  05/13/15 178 lb 6.4 oz (80.922 kg)    PHYSICAL EXAM BP 146/75 mmHg  Pulse 73  Ht 5\' 4"  (1.626 m)  Wt 181 lb (82.101 kg)  BMI 31.05 kg/m2  Orthostatic VS for the past 24 hrs:  BP- Lying Pulse- Lying BP- Sitting Pulse- Sitting BP- Standing at 0 minutes Pulse- Standing at 0 minutes  05/26/15 2255 153/78 mmHg 72 120/75 mmHg 70 122/71 mmHg 74   General appearance: alert, cooperative, appears stated age, no distress and Mildly obese Neck: no adenopathy, no carotid bruit and no JVD Lungs: clear to auscultation bilaterally, normal percussion bilaterally and non-labored Heart: regular rate and rhythm, S1, S2 normal, no murmur, click, rub or gallop; nondisplaced PMI Abdomen: soft, non-tender; bowel sounds normal; no masses,  no organomegaly; no HSM Extremities: extremities normal, atraumatic, no cyanosis, and edema trace Pulses: 2+ and symmetric;  Skin: mobility and turgor normal  Neurologic: Mental status: Alert, oriented, thought content appropriate Cranial nerves: normal (II-XII grossly intact)    Adult ECG Report  Rate: 73  ;  Rhythm: normal sinus rhythm and Normal axis, intervals and durations.;   Narrative Interpretation: Normal EKG  Other studies Reviewed: Additional studies/ records that were reviewed today include:  Recent Labs:  \ Lab Results  Component Value Date   CHOL 212* 05/18/2015   HDL 54.20 05/18/2015   LDLCALC 98 12/27/2014   LDLDIRECT 106.0 05/18/2015   TRIG 225.0* 05/18/2015   CHOLHDL 4 05/18/2015     ASSESSMENT / PLAN: Problem List Items Addressed This Visit    Tachy-brady syndrome: Rates range from 50-169 bpm in A. fib (Chronic)    Has not had any further recurrence of A. fib that I can tell. Unfortunately she has had both tachycardia and bradycardia spells, therefore limited as far as being a bus stop metoprolol She is fully anticoagulated with Xarelto.      Relevant Medications   rosuvastatin (CRESTOR) 40 MG tablet   metoprolol tartrate (LOPRESSOR) 25 MG tablet   PAF (paroxysmal atrial fibrillation) (HCC) - Primary (Chronic)    This patients CHA2DS2-VASc Score and unadjusted Ischemic Stroke Rate (% per year) is equal to 9.7 % stroke rate/year from a score of 6 - anticoagulated with Xarelto.  On low-dose beta blocker for rate control. Has not had any recurrences back and see lately      Relevant Medications   rosuvastatin (CRESTOR) 40 MG tablet   metoprolol tartrate (LOPRESSOR) 25 MG tablet   Other Relevant Orders   EKG 12-Lead   Orthostatic hypotension (Chronic)    Clearly this is consistent in her  syncope episodes. She didn't have a good heart rate response suggesting chronotropic incompetence however I'm leery of reducing the metoprolol dose because she has a history of tachycardia as well.  See plan for neurocardiac syncope      Relevant Medications   rosuvastatin (CRESTOR) 40 MG tablet   metoprolol tartrate (LOPRESSOR) 25 MG tablet   Neurocardiogenic syncope (Chronic)    Again she's had recurrent syncopal episodes that seem to have more to do with sudden drop in  blood pressure. Probably vasovagal mediated. She was clearly orthostatic on exam today. She is only on low-dose metoprolol for rate control and coronary disease as well as low-dose Lasix. She just saw her neurologist to recommended aggressive blood pressure management, however with this history of vasovagal hypotension, I actually think that lipid permissive hypertension should be warranted and targeting blood pressure 140/80. This way she will drop her blood pressures dangerously low.  Recommend support hose to avoid excess need of Lasix.  Allow permissive hypertension with blood pressures similar today at the target.  Discussed prodromal awareness and intention to sit down or lie down when symptoms began. Avoid rapid position change in  Adequate hydration. Ensure that she has bottled water at the bedside to drink upon awakening.  Allow time for positioning for walking. Can walk with a walker      Relevant Medications   rosuvastatin (CRESTOR) 40 MG tablet   metoprolol tartrate (LOPRESSOR) 25 MG tablet   Long term (current) use of anticoagulants (Chronic)    Was previously on warfarin. This was changed to [place] after her recent stroke. I think it was actually in the setting of having held her warfarin for procedure.      Hyperlipidemia with target LDL less than 70 (Chronic)    Crestor was increased to 40 mg based on recent labs, and following her recent stroke      Relevant Medications   rosuvastatin (CRESTOR) 40 MG tablet   metoprolol tartrate (LOPRESSOR) 25 MG tablet   Essential hypertension (Chronic)    Stable. With static hypotension. Allowing for permissive hypertension.      Relevant Medications   rosuvastatin (CRESTOR) 40 MG tablet   metoprolol tartrate (LOPRESSOR) 25 MG tablet   CAD-PCI OM1, 100% RCA (L-R colllaterals) (Chronic)    No recurrent anginal symptoms. No exertional dyspnea. On low-dose beta blocker, statin. Not on aspirin or Plavix because she is on  Xarelto.      Relevant Medications   rosuvastatin (CRESTOR) 40 MG tablet   metoprolol tartrate (LOPRESSOR) 25 MG tablet   Bilateral lower extremity edema    Mild edema. No real CHF symptoms to go along with it. In the past she had been prescribed compression stockings, but I don't think she is ever worn them. I recommended support hose and feet elevation. Clearly there is some component of venous insufficiency.         Current medicines are reviewed at length with the patient today. (+/- concerns) none The following changes have been made: None.  Allow permissive hypertension. Wear support hose adequately hydrate.     Studies Ordered:   Orders Placed This Encounter  Procedures  . EKG 12-Lead      Leonie Man, M.D., M.S. Interventional Cardiologist   Pager # [phone] Phone # [phone] [address] [place], [place] [address]

## 2015-05-26 NOTE — Patient Instructions (Signed)
Medication Instructions:  Your physician recommends that you continue on your current medications as directed. Please refer to the Current Medication list given to you today.   Labwork: none  Testing/Procedures: none  Follow-Up: Your physician recommends that you schedule a follow-up appointment in: three months with Dr. Ellyn Hack.    Any Other Special Instructions Will Be Listed Below (If Applicable). Hydrate (drink plenty of water). Goal is for your urine to be clear. Wear support hose or compression stockings during the day. You can purchase these over-the-counter at your pharmacy/drugstore     If you need a refill on your cardiac medications before your next appointment, please call your pharmacy.

## 2015-05-30 ENCOUNTER — Encounter: Payer: Self-pay | Admitting: Cardiology

## 2015-05-30 DIAGNOSIS — I951 Orthostatic hypotension: Secondary | ICD-10-CM | POA: Insufficient documentation

## 2015-05-30 NOTE — Assessment & Plan Note (Addendum)
Clearly this is consistent in her syncope episodes. She didn't have a good heart rate response suggesting chronotropic incompetence however I'm leery of reducing the metoprolol dose because she has a history of tachycardia as well.  See plan for neurocardiac syncope

## 2015-05-30 NOTE — Assessment & Plan Note (Signed)
Was previously on warfarin. This was changed to [place] after her recent stroke. I think it was actually in the setting of having held her warfarin for procedure.

## 2015-05-30 NOTE — Assessment & Plan Note (Addendum)
Again she's had recurrent syncopal episodes that seem to have more to do with sudden drop in blood pressure. Probably vasovagal mediated. She was clearly orthostatic on exam today. She is only on low-dose metoprolol for rate control and coronary disease as well as low-dose Lasix. She just saw her neurologist to recommended aggressive blood pressure management, however with this history of vasovagal hypotension, I actually think that lipid permissive hypertension should be warranted and targeting blood pressure 140/80. This way she will drop her blood pressures dangerously low.  Recommend support hose to avoid excess need of Lasix.  Allow permissive hypertension with blood pressures similar today at the target.  Discussed prodromal awareness and intention to sit down or lie down when symptoms began. Avoid rapid position change in  Adequate hydration. Ensure that she has bottled water at the bedside to drink upon awakening.  Allow time for positioning for walking. Can walk with a walker

## 2015-05-30 NOTE — Assessment & Plan Note (Signed)
Stable. With static hypotension. Allowing for permissive hypertension.

## 2015-05-30 NOTE — Assessment & Plan Note (Addendum)
Has not had any further recurrence of A. fib that I can tell. Unfortunately she has had both tachycardia and bradycardia spells, therefore limited as far as being a bus stop metoprolol She is fully anticoagulated with Xarelto.

## 2015-05-30 NOTE — Assessment & Plan Note (Signed)
Mild edema. No real CHF symptoms to go along with it. In the past she had been prescribed compression stockings, but I don't think she is ever worn them. I recommended support hose and feet elevation. Clearly there is some component of venous insufficiency.

## 2015-05-30 NOTE — Assessment & Plan Note (Signed)
Crestor was increased to 40 mg based on recent labs, and following her recent stroke

## 2015-05-30 NOTE — Assessment & Plan Note (Signed)
No recurrent anginal symptoms. No exertional dyspnea. On low-dose beta blocker, statin. Not on aspirin or Plavix because she is on Xarelto.

## 2015-05-30 NOTE — Assessment & Plan Note (Addendum)
This patients CHA2DS2-VASc Score and unadjusted Ischemic Stroke Rate (% per year) is equal to 9.7 % stroke rate/year from a score of 6 - anticoagulated with Xarelto.  On low-dose beta blocker for rate control. Has not had any recurrences back and see lately

## 2015-06-09 ENCOUNTER — Other Ambulatory Visit: Payer: Self-pay | Admitting: Cardiology

## 2015-06-09 NOTE — Telephone Encounter (Signed)
Rx(s) sent to pharmacy electronically.  

## 2015-06-17 ENCOUNTER — Ambulatory Visit: Payer: Medicare Other

## 2015-06-23 ENCOUNTER — Ambulatory Visit: Payer: Self-pay | Admitting: Pharmacist Clinician (PhC)/ Clinical Pharmacy Specialist

## 2015-06-23 DIAGNOSIS — Z7901 Long term (current) use of anticoagulants: Secondary | ICD-10-CM

## 2015-06-23 DIAGNOSIS — I480 Paroxysmal atrial fibrillation: Secondary | ICD-10-CM

## 2015-07-05 ENCOUNTER — Other Ambulatory Visit: Payer: Self-pay

## 2015-07-05 MED ORDER — FUROSEMIDE 20 MG PO TABS: ORAL_TABLET | ORAL | Status: DC

## 2015-07-05 MED ORDER — RIVAROXABAN 15 MG PO TABS: 15.0000 mg | ORAL_TABLET | Freq: Every day | ORAL | Status: DC

## 2015-07-05 NOTE — Telephone Encounter (Signed)
Refill sent for xarelto and furosemide.

## 2015-07-15 ENCOUNTER — Ambulatory Visit (INDEPENDENT_AMBULATORY_CARE_PROVIDER_SITE_OTHER): Payer: Medicare Other | Admitting: Diagnostic Neuroimaging

## 2015-07-15 DIAGNOSIS — R55 Syncope and collapse: Secondary | ICD-10-CM | POA: Diagnosis not present

## 2015-07-15 DIAGNOSIS — I639 Cerebral infarction, unspecified: Secondary | ICD-10-CM

## 2015-07-19 NOTE — Procedures (Signed)
   [facility]  EEG (ELECTROENCEPHALOGRAM) REPORT   STUDY DATE: 07/15/15 PATIENT NAME: Abigail Boyd DOB: 02-Mar-1929 MRN: [id number]  ORDERING CLINICIAN: Antony Contras, MD   TECHNOLOGIST: Laretta Alstrom  TECHNIQUE: Electroencephalogram was recorded utilizing standard 10-20 system of lead placement and reformatted into average and bipolar montages.  RECORDING TIME: 34 minutes ACTIVATION: photic stimulation  CLINICAL INFORMATION: 82 year old female with history of left MCA stroke and atrial fibrillation, now with episode of loss of consciousness.  FINDINGS: Background rhythms of 9-10 hertz and 40-50 microvolts. Rare frontal intermittent delta activity noted. No focal, lateralizing, epileptiform activity or seizures are seen. Patient recorded in the awake and drowsy state. EKG channel shows irregular rhythm of 100-110 beats per minute.   IMPRESSION:  Normal EEG in the awake and drowsy states. EKG channel shows irregular rhythm consistent with patient's known history of atrial fibrillation.     INTERPRETING PHYSICIAN:  Penni Bombard, MD Certified in Neurology, Neurophysiology and Neuroimaging  [facility] [address] [place],  [address] [phone]

## 2015-07-21 ENCOUNTER — Telehealth: Payer: Self-pay | Admitting: Neurology

## 2015-07-21 NOTE — Telephone Encounter (Signed)
Abigail Boyd/daughter called to request results of 07/15/15 EEG

## 2015-07-22 NOTE — Telephone Encounter (Signed)
Kindly inform patient that EEG study was normal

## 2015-07-22 NOTE — Telephone Encounter (Signed)
Per Dr Leonie Man, spoke with daughter, [name] and informed her her mother's EEG result was normal. She stated "That's all I needed to know." she verbal,ized understanding, appreciation.

## 2015-07-27 ENCOUNTER — Telehealth: Payer: Self-pay

## 2015-07-27 NOTE — Telephone Encounter (Signed)
RN talk to daughter Freida Busman on pts dpr form.Rn stated the dopplers that are done at [facility] are scan in,and it would not be on mychart. Rn explain that the carotid ultrasound study did not show any blockages on either side of the neck. Only mild age related hardening of the vessels. Pts daughter verbalized understanding.

## 2015-07-27 NOTE — Telephone Encounter (Signed)
-----   Message from Garvin Fila, MD sent at 07/23/2015  4:05 PM EDT ----- Mitchell Heir inform the patient that EEG study was normal

## 2015-07-28 ENCOUNTER — Other Ambulatory Visit: Payer: Self-pay | Admitting: Family Medicine

## 2015-08-05 DIAGNOSIS — M1711 Unilateral primary osteoarthritis, right knee: Secondary | ICD-10-CM | POA: Diagnosis not present

## 2015-08-17 DIAGNOSIS — H40053 Ocular hypertension, bilateral: Secondary | ICD-10-CM | POA: Diagnosis not present

## 2015-08-17 DIAGNOSIS — M1711 Unilateral primary osteoarthritis, right knee: Secondary | ICD-10-CM | POA: Diagnosis not present

## 2015-08-17 DIAGNOSIS — E119 Type 2 diabetes mellitus without complications: Secondary | ICD-10-CM | POA: Diagnosis not present

## 2015-08-18 DIAGNOSIS — E119 Type 2 diabetes mellitus without complications: Secondary | ICD-10-CM | POA: Diagnosis not present

## 2015-08-18 DIAGNOSIS — Z794 Long term (current) use of insulin: Secondary | ICD-10-CM | POA: Diagnosis not present

## 2015-08-19 ENCOUNTER — Telehealth: Payer: Self-pay | Admitting: Family Medicine

## 2015-08-19 ENCOUNTER — Ambulatory Visit (INDEPENDENT_AMBULATORY_CARE_PROVIDER_SITE_OTHER)
Admission: RE | Admit: 2015-08-19 | Discharge: 2015-08-19 | Disposition: A | Discharge status: Home / Self Care | Payer: Medicare Other | Source: Ambulatory Visit | Attending: Internal Medicine | Admitting: Internal Medicine

## 2015-08-19 ENCOUNTER — Other Ambulatory Visit (INDEPENDENT_AMBULATORY_CARE_PROVIDER_SITE_OTHER): Payer: Medicare Other

## 2015-08-19 ENCOUNTER — Ambulatory Visit (INDEPENDENT_AMBULATORY_CARE_PROVIDER_SITE_OTHER): Payer: Medicare Other | Admitting: Internal Medicine

## 2015-08-19 ENCOUNTER — Ambulatory Visit: Payer: Self-pay | Admitting: Internal Medicine

## 2015-08-19 ENCOUNTER — Encounter: Payer: Self-pay | Admitting: Internal Medicine

## 2015-08-19 VITALS — BP 136/86 | HR 74 | Resp 20 | Wt 186.0 lb

## 2015-08-19 DIAGNOSIS — R509 Fever, unspecified: Secondary | ICD-10-CM

## 2015-08-19 DIAGNOSIS — I10 Essential (primary) hypertension: Secondary | ICD-10-CM | POA: Diagnosis not present

## 2015-08-19 DIAGNOSIS — Z794 Long term (current) use of insulin: Secondary | ICD-10-CM

## 2015-08-19 DIAGNOSIS — E1122 Type 2 diabetes mellitus with diabetic chronic kidney disease: Secondary | ICD-10-CM

## 2015-08-19 DIAGNOSIS — R05 Cough: Secondary | ICD-10-CM | POA: Diagnosis not present

## 2015-08-19 DIAGNOSIS — N182 Chronic kidney disease, stage 2 (mild): Secondary | ICD-10-CM | POA: Diagnosis not present

## 2015-08-19 LAB — CBC WITH DIFFERENTIAL/PLATELET
BASOS ABS: 0 10*3/uL (ref 0.0–0.1)
BASOS PCT: 0 % (ref 0.0–3.0)
EOS ABS: 0 10*3/uL (ref 0.0–0.7)
Eosinophils Relative: 0 % (ref 0.0–5.0)
HEMATOCRIT: 34.3 % — AB (ref 36.0–46.0)
HEMOGLOBIN: 11.4 g/dL — AB (ref 12.0–15.0)
LYMPHS PCT: 8.9 % — AB (ref 12.0–46.0)
Lymphs Abs: 1.5 10*3/uL (ref 0.7–4.0)
MCHC: 33.4 g/dL (ref 30.0–36.0)
MCV: 89.4 fl (ref 78.0–100.0)
MONO ABS: 0.9 10*3/uL (ref 0.1–1.0)
Monocytes Relative: 5 % (ref 3.0–12.0)
Neutro Abs: 15 10*3/uL — ABNORMAL HIGH (ref 1.4–7.7)
Neutrophils Relative %: 86.1 % — ABNORMAL HIGH (ref 43.0–77.0)
Platelets: 289 10*3/uL (ref 150.0–400.0)
RBC: 3.84 Mil/uL — AB (ref 3.87–5.11)
RDW: 13.8 % (ref 11.5–15.5)
WBC: 17.4 10*3/uL — AB (ref 4.0–10.5)

## 2015-08-19 LAB — URINALYSIS, ROUTINE W REFLEX MICROSCOPIC
BILIRUBIN URINE: NEGATIVE
HGB URINE DIPSTICK: NEGATIVE
KETONES UR: NEGATIVE
LEUKOCYTES UA: NEGATIVE
NITRITE: NEGATIVE
Specific Gravity, Urine: <=1.005 — AB (ref 1.000–1.030)
Total Protein, Urine: NEGATIVE
UROBILINOGEN UA: 0.2 (ref 0.0–1.0)
Urine Glucose: NEGATIVE
pH: 6 (ref 5.0–8.0)

## 2015-08-19 LAB — HEPATIC FUNCTION PANEL
ALBUMIN: 4.6 g/dL (ref 3.5–5.2)
ALT: 16 U/L (ref 0–35)
AST: 12 U/L (ref 0–37)
Alkaline Phosphatase: 64 U/L (ref 39–117)
BILIRUBIN TOTAL: 0.3 mg/dL (ref 0.2–1.2)
Bilirubin, Direct: 0.1 mg/dL (ref 0.0–0.3)
Total Protein: 7.8 g/dL (ref 6.0–8.3)

## 2015-08-19 LAB — BASIC METABOLIC PANEL
BUN: 41 mg/dL — AB (ref 6–23)
CALCIUM: 10.3 mg/dL (ref 8.4–10.5)
CHLORIDE: 99 meq/L (ref 96–112)
CO2: 29 meq/L (ref 19–32)
CREATININE: 1.34 mg/dL — AB (ref 0.40–1.20)
GFR: 40.06 mL/min — ABNORMAL LOW (ref >60.00)
Glucose, Bld: 207 mg/dL — ABNORMAL HIGH (ref 70–99)
Potassium: 4.6 mEq/L (ref 3.5–5.1)
Sodium: 136 mEq/L (ref 135–145)

## 2015-08-19 NOTE — Telephone Encounter (Signed)
FYI

## 2015-08-19 NOTE — Telephone Encounter (Signed)
Spoke with patients daughter after seeing that they had called the nurse line. Patient started having headache in the middle of last night and some of the pain was relieved with tylenol. Patient daughter notice that patients right eye was drooping just a little, but patient stated that she thought it was fine. Patients daughter declined patient having any SOB, chest pain, dizziness, or blurred vision. She also gave me three BS readings that was taken at 9 PM 452, 12 AM 411, and 10 AM this morning 365. Seen by endocrinology yesterday and they increased her diabetes medication. After speaking with Dr. Caryl Bis advised patient to go to ED. She has an appointment at [facility] this afternoon is they do not want to go should keep appointment with Elam.

## 2015-08-19 NOTE — Telephone Encounter (Signed)
[name] Patient Name: Abigail Boyd DOB: [birth year] Initial Comment Pt is having a headache-- She has had two or three strokes before. Nurse Assessment Nurse: Dimas Chyle, RN, Dellis Filbert Date/Time Eilene Ghazi Time): 08/19/2015 11:24:17 AM Confirm and document reason for call. If symptomatic, describe symptoms. You must click the next button to save text entered. ---Pt is having a headache-- She has had two or three strokes before. Headache eased off with Tylenol overnight but back this morning. BS is 365. Has the patient traveled out of the country within the last 30 days? ---No Does the patient have any new or worsening symptoms? ---Yes Will a triage be completed? ---Yes Related visit to physician within the last 2 weeks? ---No Does the PT have any chronic conditions? (i.e. diabetes, asthma, etc.) ---Yes List chronic conditions. ---Diabetes type, hx of CVA, afib, HTN Is this a behavioral health or substance abuse call? ---No Guidelines Guideline Title Affirmed Question Affirmed Notes Diabetes - High Blood Sugar [1] Blood glucose > 300 mg/dl (16.5 mmol/ l) AND [2] two or more times in a row Headache [1] New headache AND [2] age > 37 Final Disposition User Call PCP Now Dimas Chyle, RN, Dellis Filbert Comments Caller had contacted endrocrinologist about elevated BS. Was seen in endocrinoligist's office yesterday. Referrals REFERRED TO PCP OFFICE Disagree/Comply: Comply Disagree/Comply: Comply

## 2015-08-19 NOTE — Patient Instructions (Signed)
Please continue all other medications as before, and refills have been done if requested.  Please have the pharmacy call with any other refills you may need.  Please continue your efforts at being more active, low cholesterol diet, and weight control.  You are otherwise up to date with prevention measures today.  Please keep your appointments with your specialists as you may have planned  Please go to the XRAY Department in the Basement (go straight as you get off the elevator) for the x-ray testing  Please go to the LAB in the Basement (turn left off the elevator) for the tests to be done today  You will be contacted by phone if any changes need to be made immediately.  Otherwise, you will receive a letter about your results with an explanation, but please check with MyChart first.  Please remember to sign up for MyChart if you have not done so, as this will be important to you in the future with finding out test results, communicating by private email, and scheduling acute appointments online when needed.  

## 2015-08-19 NOTE — Telephone Encounter (Signed)
Spoke with patient daughters about this. Please see other note.

## 2015-08-19 NOTE — Telephone Encounter (Signed)
Agree. I did advise CMA to tell the patient to go to the emergency room. If not going to the emergency room they need to keep the appointment this afternoon.

## 2015-08-19 NOTE — Telephone Encounter (Signed)
appt has been made for this afternoon w/Dr. Jenny Reichmann...Abigail Boyd

## 2015-08-19 NOTE — Progress Notes (Signed)
Subjective:    Patient ID: Abigail Boyd, female    DOB: 1929/10/23, 82 y.o.   MRN: [id number]  HPI  Here 1-2 days acute onset feeling warm, HA, flushed face since last PM but no ST, cough more than usual, worse last night, had BS over 400 yesterday, insulin increased per [facility] endo yesterday at appt.  Pt denies polydipsia, polyuria, or low sugar symptoms such as weakness or confusion improved with po intake.   Denies urinary symptoms such as dysuria, frequency, urgency, flank pain, hematuria or n/v, fever, chills.  No recent falls.  Pt denies new neurological symptoms such as new facial or extremity weakness or numbness - does have hx of recent very small cva mar 2017. Pt denies chest pain, increased sob or doe, wheezing, orthopnea, PND, increased LE swelling, palpitations, dizziness or syncope.  Denies worsening reflux, abd pain, dysphagia, n/v, bowel change or blood.   Past Medical History  Diagnosis Date  . History of unstable angina 06/13/2011    Jaw pain awakening from sleep -- Myoview --CATH --> PCI  . Abnormal nuclear stress test 06/21/2011    Inferolateral reversible defect;--> cardiac cath & PCI of CxOM, occluded RCA with collaterals;; followup Myoview 11/2012: Low risk. Fixed basal inferior artifact normal EF. No ischemia  . CAD S/P percutaneous coronary angioplasty     s/P PCI to proximal OM1 w/ DES; occluded RCA with bridging and L-R collaterals (medical management)  . Shortness of breath on exertion October 2013    2-D echo: Normal EF>55%, Gr 1DD, mild aortic sclerosis; Evaluated with CPET - peak VO2 97%; Chronotropic Incompetence (submaximal effort)  . Chronotropic incompetence with sinus node dysfunction [facility]) October 2013    On CPET test; beta blockers reduced  . PAF (paroxysmal atrial fibrillation) ([place]) 10-11/ 2014    CardioNet Event Monitor: NSR & S Brady -- Rates 50-100; Total A. fib burden 35 hours and 27 minutes. 1296 episodes, longest was 1 hour 29 minutes. Rate ranged from  52-169 beats per minute.  . Tachycardia-bradycardia syndrome ([place]) 11/2012  . History of syncope     Per EP - neurocardiogenic & not Bradycardia related (no PPM)  . OSA (obstructive sleep apnea)     hx bladder infections  . Diabetes mellitus type 2, controlled ([place])     On oral medications  . Essential hypertension     Allowing for permissive hypertension to avoid orthostatic hypotension  . Hyperlipidemia with target LDL less than 70     HDL at goal, LDL not at goal, borderline triglycerides. On Crestor 20 mg  . Chronic kidney disease (CKD), stage II (mild)     Related to current bladder infections (although diabetes cannot be excluded)  . Spinal stenosis of lumbar region 11/2011  . Osteoarthritis   . GERD (gastroesophageal reflux disease)     On PPI  . Seasonal allergies   . Anxiety     When necessary Xanax  . Glaucoma   . Bilateral cataracts     Status post stroke or correction  . Diverticulitis   . Migraines   . Stroke ([place])   . Urge incontinence    Past Surgical History  Procedure Laterality Date  . Cataracts      both eyes  . Colonoscopy    . Vaginal hysterectomy    . Breast biopsy      both breast  . Cardiac catheterization  07/05/2011    Unstable Angina --> Myoview wiht Inf-Lat Ischemia.  Proximal OM1 lesion -->  PCI; 100% mid RCA with right to right and left to right bridging collaterals from circumflex RPL and LAD the PDA.  Marland Kitchen Coronary angioplasty with stent placement  07/05/2011    Promus Element DES 2.5 mm x 16 mm-post dilated to 2.65 mm CX-Proximal OM1  . Nm myoview ltd  October 2014     Low risk. Fixed basal inferior artifact normal EF. No ischemia --> (as compared to pre-PCI Myoview revealing inferolateral ischemia)  . Left heart catheterization with coronary angiogram N/A 07/06/2011    Procedure: LEFT HEART CATHETERIZATION WITH CORONARY ANGIOGRAM;  Surgeon: Leonie Man, MD;  Location: [facility] CATH LAB;  Service: Cardiovascular;  Laterality: N/A;  . Knee  arthroscopy with medial menisectomy Right 06/24/2014    Procedure: RIGHT KNEE ARTHROSCOPY WITH partial lateral MENISECTOMY, abrasion chondroplasty of medial femoral condyle and patella, microfracture technique;  Surgeon: Latanya Maudlin, MD;  Location: [facility] ORS;  Service: Orthopedics;  Laterality: Right;  . Appendectomy    . Transthoracic echocardiogram  11/2014    EF 65-70%, Mod LVH. Normal wall motion. Gr 1 DD. Normal valves.    reports that she has never smoked. She has never used smokeless tobacco. She reports that she does not drink alcohol or use illicit drugs. family history includes Breast cancer in her sister; COPD in her mother; Cancer in her sister and sister; Clotting disorder in her sister; Heart attack in her father and sister; Heart disease in her father; Pulmonary fibrosis in her mother; Stroke in her father. There is no history of Colon cancer, Esophageal cancer, or Stomach cancer. Allergies  Allergen Reactions  . Lisinopril Cough  . Losartan Cough  . Sulfa Antibiotics Hives   Current Outpatient Prescriptions on File Prior to Visit  Medication Sig Dispense Refill  . acetaminophen (TYLENOL) 650 MG CR tablet Take 1,300 mg by mouth 2 (two) times daily as needed for pain.    Marland Kitchen ALPRAZolam (XANAX) 0.25 MG tablet Take 1 tablet (0.25 mg total) by mouth at bedtime as needed for sleep or anxiety. 30 tablet 0  . cholecalciferol (VITAMIN D) 1000 UNITS tablet Take 1,000 Units by mouth daily.    Marland Kitchen docusate sodium (STOOL SOFTENER) 100 MG capsule Take 100 mg by mouth daily.     . fexofenadine (ALLEGRA) 180 MG tablet Take 180 mg by mouth at bedtime.     . fish oil-omega-3 fatty acids 1000 MG capsule Take 1 g by mouth daily.     . furosemide (LASIX) 20 MG tablet TAKE 1 TABLET BY MOUTH EVERY DAY AS NEEDED THEN 2 TABLETS IF SWELLING 180 tablet 3  . Insulin Glargine (LANTUS) 100 UNIT/ML Solostar Pen Inject 50 Units into the skin.     Marland Kitchen insulin lispro (HUMALOG KWIKPEN) 100 UNIT/ML KiwkPen Inject 10  Units into the skin 3 (three) times daily as needed (CBG >200).     Marland Kitchen ipratropium (ATROVENT) 0.06 % nasal spray Place 1 spray into the nose daily.     Marland Kitchen latanoprost (XALATAN) 0.005 % ophthalmic solution Instill one drop into each eye every night at bedtime    . levocetirizine (XYZAL) 5 MG tablet Take 5 mg by mouth every evening.     Marland Kitchen LUMIGAN 0.01 % SOLN Place 1 drop into both eyes at bedtime.     . Magnesium 500 MG CAPS Take 500 mg by mouth daily as needed (constipation).     . metoprolol tartrate (LOPRESSOR) 25 MG tablet Take 1 tablet (25 mg total) by mouth 2 (two) times daily. HOLD  for SBP <110 and HR < 60 180 tablet 3  . mirabegron ER (MYRBETRIQ) 50 MG TB24 tablet Take one tablet by mouth once daily for kidneys    . nitroGLYCERIN (NITROSTAT) 0.4 MG SL tablet Place 0.4 mg under the tongue every 5 (five) minutes as needed for chest pain.    Marland Kitchen nystatin cream (MYCOSTATIN) Apply 1 application topically daily.  1  . omeprazole (PRILOSEC) 20 MG capsule Take 20 mg by mouth daily.     . ONE TOUCH ULTRA TEST test strip 1 each by Other route as needed.     Marland Kitchen PARoxetine (PAXIL) 10 MG tablet Take 10 mg by mouth daily.     . Rivaroxaban (XARELTO) 15 MG TABS tablet Take 1 tablet (15 mg total) by mouth daily with supper. 90 tablet 3  . rOPINIRole (REQUIP) 0.5 MG tablet TAKE 1 TABLET (0.5 MG TOTAL) BY MOUTH AT BEDTIME. 30 tablet 2  . rosuvastatin (CRESTOR) 40 MG tablet Take 1 tablet (40 mg total) by mouth daily. 90 tablet 3  . sitaGLIPtin (JANUVIA) 50 MG tablet Take 50 mg by mouth at bedtime.     . VESICARE 5 MG tablet TAKE 1 TABLET (5 MG TOTAL) BY MOUTH ONCE DAILY.  11   No current facility-administered medications on file prior to visit.   Review of Systems  Constitutional: Negative for unusual diaphoresis or night sweats HENT: Negative for ear swelling or discharge Eyes: Negative for worsening visual haziness  Respiratory: Negative for choking and stridor.   Gastrointestinal: Negative for distension  or worsening eructation Genitourinary: Negative for retention or change in urine volume.  Musculoskeletal: Negative for other MSK pain or swelling Skin: Negative for color change and worsening wound Neurological: Negative for tremors and numbness other than noted  Psychiatric/Behavioral: Negative for decreased concentration or agitation other than above       Objective:   Physical Exam BP 136/86 mmHg  Pulse 74  Resp 20  Wt 186 lb (84.369 kg)  SpO2 96% VS noted, non toxic but flushed, mild general weakness, walks with cane, slower to get up on exam table  Constitutional: Pt appears in no apparent distress HENT: Head: NCAT.  Right Ear: External ear normal.  Left Ear: External ear normal.  Bilat tm's without erythema.  Max sinus areas non tender.  Pharynx with mild erythema, no exudate Eyes: . Pupils are equal, round, and reactive to light. Conjunctivae and EOM are normal Neck: Normal range of motion. Neck supple.  Cardiovascular: Normal rate and regular rhythm.   Pulmonary/Chest: Effort normal and breath sounds without rales or wheezing.  Abd:  Soft, NT, ND, + BS, no flank tender Neurological: Pt is alert.  HOH but no confused, motor grossly intact Skin: Skin is warm. No rash, no LE edema Psychiatric: Pt behavior is normal. No agitation.   Most recent MRI/MRA summary: CLINICAL DATA: 82 year old diabetic hypertensive female with syncopal episode 2 days ago. History of stroke 2 months ago. Subsequent encounter.  EXAM: MRI HEAD WITHOUT CONTRAST  MRA HEAD WITHOUT CONTRAST  TECHNIQUE: Multiplanar, multiecho pulse sequences of the brain and surrounding structures were obtained without intravenous contrast. Angiographic images of the head were obtained using MRA technique without contrast.  COMPARISON: 04/18/2015 head CT. 12/26/2014 brain MR and MR angiogram.  FINDINGS: MRI HEAD FINDINGS  Very small subacute nonhemorrhagic infarct right splenium of the corpus  callosum.  Remote posterior left corona radiata infarct.  Marked small vessel disease changes.  Moderate global atrophy without hydrocephalus.  No intracranial  mass lesion noted on this unenhanced exam.  No intracranial hemorrhage.  Cervical medullary junction unremarkable. Transverse ligament hypertrophy with mild narrowing ventral thecal sac without cord compression.  Post lens replacement otherwise orbital structures unremarkable. Minimal to mild paranasal sinus mucosal thickening.  MRA HEAD FINDINGS  Mild narrowing and irregularity cavernous segment internal carotid artery bilaterally.  Mild moderate narrowing distal A1 segment left anterior cerebral artery.  Mild to moderate narrowing A2 segment anterior cerebral artery bilaterally.  Mild to moderate narrowing middle cerebral artery branch vessels bilaterally.  No significant stenosis distal vertebral arteries or basilar artery.  Nonvisualized left posterior inferior cerebellar artery with small right posterior inferior cerebellar artery.  Moderate narrowing superior cerebellar arteries bilaterally.  High-grade stenosis left posterior cerebral artery P2 segment. Posterior cerebral artery distal branch vessel mild to moderate narrowing and irregularity greater on the left.  No aneurysm noted.  IMPRESSION: MRI HEAD  Very small subacute nonhemorrhagic infarct right splenium of the corpus callosum.  Remote posterior left corona radiata infarct.  Marked small vessel disease changes.  Moderate global atrophy without hydrocephalus.  Minimal to mild paranasal sinus mucosal thickening.  MRA HEAD  Mild narrowing and irregularity cavernous segment internal carotid artery bilaterally.  Mild to moderate narrowing distal A1 segment left anterior cerebral artery.  Mild to moderate narrowing A2 segment anterior cerebral artery bilaterally.  Mild to moderate narrowing middle  cerebral artery branch vessels bilaterally.  No significant stenosis distal vertebral arteries or basilar artery.  Nonvisualized left posterior inferior cerebellar artery with small right posterior inferior cerebellar artery.  Moderate narrowing superior cerebellar arteries bilaterally.  High-grade stenosis left posterior cerebral artery P2 segment. Posterior cerebral artery distal branch vessel mild to moderate narrowing and irregularity greater on the left.   Electronically Signed  By: Genia Del M.D.  On: 05/18/2015 15:17    Assessment & Plan:

## 2015-08-19 NOTE — Telephone Encounter (Signed)
Spoke to patients daughter,  She stated that patient has pain right behind her eyes, face is beet red and the right side of face is alittle swollen. Patient has had a head since lastnight.  Patient has taken tylenol for headache.  Patients daughter stated that she is not acting her normal self. Patient has a past medical history of strokes.  Patient is weak and very confused at times. I instructed patient to go to the ED but patient does not want to go to the ED. Patient will be going to Urgent care or ED confirmed by Patients daughter who will be driving.

## 2015-08-19 NOTE — Progress Notes (Signed)
Pre visit review using our clinic review tool, if applicable. No additional management support is needed unless otherwise documented below in the visit note. 

## 2015-08-19 NOTE — Telephone Encounter (Signed)
Pt daughter called wanting to know if pt needs to come in? Pt has had a headache since yesterday and her face is flush. Pt has had two or three strokes already.  Call daughter @ [phone]. Thank you!

## 2015-08-20 ENCOUNTER — Other Ambulatory Visit: Payer: Self-pay | Admitting: Internal Medicine

## 2015-08-20 ENCOUNTER — Telehealth: Payer: Self-pay | Admitting: *Deleted

## 2015-08-20 ENCOUNTER — Other Ambulatory Visit (INDEPENDENT_AMBULATORY_CARE_PROVIDER_SITE_OTHER): Payer: Medicare Other

## 2015-08-20 DIAGNOSIS — R509 Fever, unspecified: Secondary | ICD-10-CM

## 2015-08-20 LAB — HEPATIC FUNCTION PANEL
ALBUMIN: 4.5 g/dL (ref 3.5–5.2)
ALK PHOS: 62 U/L (ref 39–117)
ALT: 16 U/L (ref 0–35)
AST: 13 U/L (ref 0–37)
BILIRUBIN DIRECT: 0 mg/dL (ref 0.0–0.3)
BILIRUBIN TOTAL: 0.3 mg/dL (ref 0.2–1.2)
Total Protein: 7.9 g/dL (ref 6.0–8.3)

## 2015-08-20 LAB — URINE CULTURE: Colony Count: 40000

## 2015-08-20 MED ORDER — LEVOFLOXACIN 500 MG PO TABS: 500.0000 mg | ORAL_TABLET | Freq: Every day | ORAL | Status: DC

## 2015-08-20 NOTE — Telephone Encounter (Signed)
Lab scheduled °

## 2015-08-20 NOTE — Telephone Encounter (Signed)
fyi

## 2015-08-20 NOTE — Telephone Encounter (Signed)
FYI Patients daughter stated that patient received a Cortizone shot on 08/17/15,Kenalog 40 mg Marcaine . She stated that her endocrinologist stated that the Cortizone shot increases the white blood count and blood sugar levels. So the hemolog has been increased .   pt daughter Beryle Beams [phone]

## 2015-08-21 NOTE — Assessment & Plan Note (Addendum)
stable overall by history and exam, recent data reviewed with pt, and pt to continue medical treatment as before,  to f/u any worsening symptoms or concerns Lab Results  Component Value Date   HGBA1C 8.4* 05/18/2015  for cont'd med as per [facility] endo.

## 2015-08-21 NOTE — Assessment & Plan Note (Signed)
stable overall by history and exam, recent data reviewed with pt, and pt to continue medical treatment as before,  to f/u any worsening symptoms or concerns BP Readings from Last 3 Encounters:  08/19/15 136/86  05/26/15 146/75  05/24/15 125/75

## 2015-08-21 NOTE — Assessment & Plan Note (Signed)
Etiology unclear, seems mild ill but nontoxic, flushed and mild weakness, afeb here but likely feverish at home, suspect infectious etiology but exam benign, for cxr, lab f/u, consider antibx pending results

## 2015-08-23 NOTE — Telephone Encounter (Signed)
Noted. Appears Dr Jenny Reichmann obtained a blood culture to evaluate. Still in process.

## 2015-08-25 ENCOUNTER — Encounter: Payer: Self-pay | Admitting: Cardiology

## 2015-08-25 ENCOUNTER — Ambulatory Visit (INDEPENDENT_AMBULATORY_CARE_PROVIDER_SITE_OTHER): Payer: Medicare Other | Admitting: Cardiology

## 2015-08-25 VITALS — BP 140/56 | HR 67 | Ht 65.0 in | Wt 182.2 lb

## 2015-08-25 DIAGNOSIS — Z9861 Coronary angioplasty status: Secondary | ICD-10-CM

## 2015-08-25 DIAGNOSIS — I480 Paroxysmal atrial fibrillation: Secondary | ICD-10-CM

## 2015-08-25 DIAGNOSIS — I6310 Cerebral infarction due to embolism of unspecified precerebral artery: Secondary | ICD-10-CM | POA: Diagnosis not present

## 2015-08-25 DIAGNOSIS — I495 Sick sinus syndrome: Secondary | ICD-10-CM

## 2015-08-25 DIAGNOSIS — I2510 Atherosclerotic heart disease of native coronary artery without angina pectoris: Secondary | ICD-10-CM

## 2015-08-25 DIAGNOSIS — I951 Orthostatic hypotension: Secondary | ICD-10-CM

## 2015-08-25 DIAGNOSIS — E785 Hyperlipidemia, unspecified: Secondary | ICD-10-CM

## 2015-08-25 DIAGNOSIS — I10 Essential (primary) hypertension: Secondary | ICD-10-CM | POA: Diagnosis not present

## 2015-08-25 NOTE — Patient Instructions (Signed)
Medication Instructions:  Your physician has recommended you make the following change in your medication:  CONTINUE taking lasix 20mg  daily. You may take 40mg  daily as needed for increased swelling  Labwork: none  Testing/Procedures: none  Follow-Up: Your physician recommends that you schedule a follow-up appointment in: 4 months with Dr. Ellyn Hack in the [place] office.   Any Other Special Instructions Will Be Listed Below (If Applicable).     If you need a refill on your cardiac medications before your next appointment, please call your pharmacy.

## 2015-08-25 NOTE — Progress Notes (Signed)
PCP: Tommi Rumps, MD  Clinic Note: Chief Complaint  Patient presents with  . other    3 month f/u c/o sob. Meds reviewed verbally with pt.    HPI: Abigail Boyd is a 82 y.o. female with a PMH below who presents today for three-month follow-up of CAD-PCI, Afib (with bradycardia issues) & recent episodes of syncope.Abigail Boyd was last seen in March 2017  Recent Hospitalizations: None since last visit  Studies Reviewed:  Holter Monitor March 2017: Study Highlights     Sinus rhythm rates 52-149 bpm  Isolated PACs with rare couplets and bigeminy.  Multiple short runs of PAT/PSVT  Longest run of tachycardia arrhythmia --> 122 bpm - 204 beats on March 3 at 1313 hrs  Estimated 14% of time and atrial fib/flutter -episode of apparent atrial flutter March 3 at 1411  Relatively rare PVCs.   Several short runs of tachycardia (cannot exclude atrial fibrillation/flutter). Question if this could be potentially related to her syncopal events. There was a brief post termination pause.  This was reviewed by Dr. Caryl Comes. Not thought to be significant or a potential etiology for syncope    Interval History: Abigail Boyd presents today really pretty much stable overall. She has some dyspnea, mostly because it is very difficult for her to do much in the way of any walking. She had a joint injection not that long ago, but still has significantly reduced mobility because of her arthritic osteoarthritis. She walks with a rolling walker. She has some exertional dyspnea, but denies any chest tightness or pressure. No PND or orthopnea, but she sleeps in a recliner since because of her back pain. She has not had any further syncopal episodes. No irregular heartbeats or flopping/palpitations. No syncope/near syncope or TIA/amaurosis fugax.   No melena, hematochezia, hematuria, or epstaxis. No claudication.  ROS: A comprehensive was performed. Review of Systems  Constitutional: Positive for fever  (She has a feverish symptoms about a week ago. Unclear what the etiology was.).  HENT: Positive for congestion. Negative for nosebleeds.   Eyes: Negative for blurred vision.  Cardiovascular: Positive for leg swelling (Off-and-on. Has really only been taking 20 mg as needed of Lasix.).       Per history of present illness  Gastrointestinal: Negative for heartburn, blood in stool and melena.  Genitourinary: Positive for frequency. Negative for dysuria and hematuria.  Musculoskeletal: Positive for back pain and joint pain (Bilateral knees and hip pain).  Neurological: Negative for headaches.       Unsteady gait, mostly because of arthritic injuries/pain  Endo/Heme/Allergies: Does not bruise/bleed easily.  Psychiatric/Behavioral: Positive for memory loss (Needs her daughter with her now. She is now moved in with her daughter.). Negative for depression. The patient is not nervous/anxious and does not have insomnia.   All other systems reviewed and are negative.    Past Medical History  Diagnosis Date  . History of unstable angina 06/13/2011    Jaw pain awakening from sleep -- Myoview --CATH --> PCI  . Abnormal nuclear stress test 06/21/2011    Inferolateral reversible defect;--> cardiac cath & PCI of CxOM, occluded RCA with collaterals;; followup Myoview 11/2012: Low risk. Fixed basal inferior artifact normal EF. No ischemia  . CAD S/P percutaneous coronary angioplasty     s/P PCI to proximal OM1 w/ DES; occluded RCA with bridging and L-R collaterals (medical management)  . Shortness of breath on exertion October 2013    2-D echo: Normal EF>55%, Gr 1DD, mild  aortic sclerosis; Evaluated with CPET - peak VO2 97%; Chronotropic Incompetence (submaximal effort)  . Chronotropic incompetence with sinus node dysfunction [facility]) October 2013    On CPET test; beta blockers reduced  . PAF (paroxysmal atrial fibrillation) ([place]) 10-11/ 2014    CardioNet Event Monitor: NSR & S Brady -- Rates 50-100; Total A.  fib burden 35 hours and 27 minutes. 1296 episodes, longest was 1 hour 29 minutes. Rate ranged from 52-169 beats per minute.  . Tachycardia-bradycardia syndrome ([place]) 11/2012  . History of syncope     Per EP - neurocardiogenic & not Bradycardia related (no PPM)  . OSA (obstructive sleep apnea)     hx bladder infections  . Diabetes mellitus type 2, controlled ([place])     On oral medications  . Essential hypertension     Allowing for permissive hypertension to avoid orthostatic hypotension  . Hyperlipidemia with target LDL less than 70     HDL at goal, LDL not at goal, borderline triglycerides. On Crestor 20 mg  . Chronic kidney disease (CKD), stage II (mild)     Related to current bladder infections (although diabetes cannot be excluded)  . Spinal stenosis of lumbar region 11/2011  . Osteoarthritis   . GERD (gastroesophageal reflux disease)     On PPI  . Seasonal allergies   . Anxiety     When necessary Xanax  . Glaucoma   . Bilateral cataracts     Status post stroke or correction  . Diverticulitis   . Migraines   . Stroke ([place])   . Urge incontinence     Past Surgical History  Procedure Laterality Date  . Cataracts      both eyes  . Colonoscopy    . Vaginal hysterectomy    . Breast biopsy      both breast  . Cardiac catheterization  07/05/2011    Unstable Angina --> Myoview wiht Inf-Lat Ischemia.  Proximal OM1 lesion --> PCI; 100% mid RCA with right to right and left to right bridging collaterals from circumflex RPL and LAD the PDA.  Marland Kitchen Coronary angioplasty with stent placement  07/05/2011    Promus Element DES 2.5 mm x 16 mm-post dilated to 2.65 mm CX-Proximal OM1  . Nm myoview ltd  October 2014     Low risk. Fixed basal inferior artifact normal EF. No ischemia --> (as compared to pre-PCI Myoview revealing inferolateral ischemia)  . Left heart catheterization with coronary angiogram N/A 07/06/2011    Procedure: LEFT HEART CATHETERIZATION WITH CORONARY ANGIOGRAM;  Surgeon: Leonie Man, MD;  Location: [facility] C[facility] LAB;  Service: Cardiovascular;  Laterality: N/A;  . Knee arthroscopy with medial menisectomy Right 06/24/2014    Procedure: RIGHT KNEE ARTHROSCOPY WITH partial lateral MENISECTOMY, abrasion chondroplasty of medial femoral condyle and patella, microfracture technique;  Surgeon: Latanya Maudlin, MD;  Location: [facility] ORS;  Service: Orthopedics;  Laterality: Right;  . Appendectomy    . Transthoracic echocardiogram  11/2014    EF 65-70%, Mod LVH. Normal wall motion. Gr 1 DD. Normal valves.    Prior to Admission medications   Medication Sig Start Date End Date Taking? Authorizing Provider  acetaminophen (TYLENOL) 650 MG CR tablet Take 1,300 mg by mouth 2 (two) times daily as needed for pain.   Yes Historical Provider, MD  ALPRAZolam (XANAX) 0.25 MG tablet Take 1 tablet (0.25 mg total) by mouth at bedtime as needed for sleep or anxiety. 12/29/14  Yes Nita Sells, MD  cholecalciferol (VITAMIN D) 1000 UNITS  tablet Take 1,000 Units by mouth daily.   Yes Historical Provider, MD  docusate sodium (STOOL SOFTENER) 100 MG capsule Take 100 mg by mouth daily.    Yes Historical Provider, MD  fexofenadine (ALLEGRA) 180 MG tablet Take 180 mg by mouth at bedtime.    Yes Historical Provider, MD  fish oil-omega-3 fatty acids 1000 MG capsule Take 1 g by mouth daily.    Yes Historical Provider, MD  furosemide (LASIX) 20 MG tablet TAKE 1 TABLET BY MOUTH EVERY DAY AS NEEDED THEN 2 TABLETS IF SWELLING Patient taking differently: Take 40 mg by mouth daily. TAKE 1 TABLET BY MOUTH EVERY DAY AS NEEDED THEN 2 TABLETS IF SWELLING 07/05/15  Yes Leonie Man, MD  Insulin Glargine (LANTUS) 100 UNIT/ML Solostar Pen Inject 50 Units into the skin.  03/31/15  Yes Historical Provider, MD  insulin lispro (HUMALOG KWIKPEN) 100 UNIT/ML KiwkPen Inject 10 Units into the skin 3 (three) times daily as needed (CBG >200).    Yes Historical Provider, MD  ipratropium (ATROVENT) 0.06 % nasal spray Place 1 spray into  the nose daily.  12/08/10  Yes Historical Provider, MD  latanoprost (XALATAN) 0.005 % ophthalmic solution Instill one drop into each eye every night at bedtime   Yes Historical Provider, MD  levocetirizine (XYZAL) 5 MG tablet Take 5 mg by mouth every evening.  03/22/15  Yes Historical Provider, MD  LUMIGAN 0.01 % SOLN Place 1 drop into both eyes at bedtime.  03/06/15  Yes Historical Provider, MD  Magnesium 500 MG CAPS Take 500 mg by mouth daily as needed (constipation).    Yes Historical Provider, MD  metoprolol tartrate (LOPRESSOR) 25 MG tablet Take 1 tablet (25 mg total) by mouth 2 (two) times daily. HOLD for SBP <110 and HR < 60 05/26/15  Yes Leonie Man, MD  mirabegron ER (MYRBETRIQ) 50 MG TB24 tablet Take one tablet by mouth once daily for kidneys   Yes Historical Provider, MD  nitroGLYCERIN (NITROSTAT) 0.4 MG SL tablet Place 0.4 mg under the tongue every 5 (five) minutes as needed for chest pain.   Yes Historical Provider, MD  nystatin cream (MYCOSTATIN) Apply 1 application topically daily. 12/03/14  Yes Historical Provider, MD  omeprazole (PRILOSEC) 20 MG capsule Take 20 mg by mouth daily.  02/28/15  Yes Historical Provider, MD  ONE TOUCH ULTRA TEST test strip 1 each by Other route as needed.  03/18/15  Yes Historical Provider, MD  PARoxetine (PAXIL) 10 MG tablet Take 10 mg by mouth daily.  02/28/15  Yes Historical Provider, MD  Rivaroxaban (XARELTO) 15 MG TABS tablet Take 1 tablet (15 mg total) by mouth daily with supper. 07/05/15  Yes Leonie Man, MD  rOPINIRole (REQUIP) 0.5 MG tablet TAKE 1 TABLET (0.5 MG TOTAL) BY MOUTH AT BEDTIME. 07/28/15  Yes Leone Haven, MD  rosuvastatin (CRESTOR) 40 MG tablet Take 1 tablet (40 mg total) by mouth daily. 05/26/15  Yes Leonie Man, MD  sitaGLIPtin (JANUVIA) 50 MG tablet Take 50 mg by mouth at bedtime.    Yes Historical Provider, MD  VESICARE 5 MG tablet TAKE 1 TABLET (5 MG TOTAL) BY MOUTH ONCE DAILY. 03/30/15  Yes Historical Provider, MD   Allergies   Allergen Reactions  . Lisinopril Cough  . Losartan Cough  . Sulfa Antibiotics Hives     Social History   Social History  . Marital Status: Widowed    Spouse Name: N/A  . Number of Children: N/A  .  Years of Education: N/A   Occupational History  . retired    Social History Main Topics  . Smoking status: Never Smoker   . Smokeless tobacco: Never Used  . Alcohol Use: No  . Drug Use: No  . Sexual Activity: No   Other Topics Concern  . None   Social History Narrative   Widowed mother of one, grandmother 2. Has not been using silver take as much lately due to knee discomfort. Usually exercises with Silver Sneakers on regular basis.   Family History  Problem Relation Age of Onset  . Colon cancer Neg Hx   . Esophageal cancer Neg Hx   . Stomach cancer Neg Hx   . COPD Mother   . Heart attack Father   . Heart disease Father   . Cancer Sister   . Cancer Sister   . Pulmonary fibrosis Mother   . Clotting disorder Sister   . Heart attack Sister   . Stroke Father   . Arthritis    . Breast cancer Sister      Wt Readings from Last 3 Encounters:  08/25/15 182 lb 4 oz (82.668 kg)  08/19/15 186 lb (84.369 kg)  05/26/15 181 lb (82.101 kg)    PHYSICAL EXAM BP 140/56 mmHg  Pulse 67  Ht 5\' 5"  (1.651 m)  Wt 182 lb 4 oz (82.668 kg)  BMI 30.33 kg/m2 General appearance: alert, cooperative, appears stated age, no distress and Mildly obese Neck: no adenopathy, no carotid bruit and no JVD Lungs: clear to auscultation bilaterally, normal percussion bilaterally and non-labored Heart: regular rate and rhythm, S1, S2 normal, no murmur, click, rub or gallop; nondisplaced PMI Abdomen: soft, non-tender; bowel sounds normal; no masses, no organomegaly; no HSM Extremities: extremities normal, atraumatic, no cyanosis, and edema trace Pulses: 2+ and symmetric;  Skin: mobility and turgor normal  Neurologic: Mental status: Alert, oriented, thought content appropriate Cranial nerves:  normal (II-XII grossly intact)   Adult ECG Report  Rate: 67;  Rhythm: normal sinus rhythm and Nonspecific ST and T-wave abnormality.; otherwise normal axis, intervals and durations  Narrative Interpretation: Stable EKG.   Other studies Reviewed: Additional studies/ records that were reviewed today include:  Recent Labs:   Lab Results  Component Value Date   CHOL 212* 05/18/2015   HDL 54.20 05/18/2015   LDLCALC 98 12/27/2014   LDLDIRECT 106.0 05/18/2015   TRIG 225.0* 05/18/2015   CHOLHDL 4 05/18/2015    ASSESSMENT / PLAN: Problem List Items Addressed This Visit    Tachy-brady syndrome: Rates range from 50-169 bpm in A. fib (Chronic)    [place] tell. No further recurrences of rapid heartbeats or syncope/near syncope. Aspirin continue low-dose beta blocker.      Relevant Orders   EKG 12-Lead (Completed)   PAF (paroxysmal atrial fibrillation) (HCC) (Chronic)   Relevant Orders   EKG 12-Lead (Completed)   Orthostatic hypotension (Chronic)    With orthostatic hypotension and neurocardiac syncope, will allow for mild permissive hypertension and not push blood pressure control to much. Continue to recommend adequate hydration and support stockings/compression stockings.      Hyperlipidemia with target LDL less than 70 (Chronic)    Labs just checked in March. Not quite at goal with LDL of 106. She is on Crestor at increased dose since her stroke. I'm inclined to continue to monitor and adjust further.      Relevant Orders   EKG 12-Lead (Completed)   Essential hypertension (Chronic)    Pretty stable. The mucosa  management. On Lopressor at low dose.      Relevant Orders   EKG 12-Lead (Completed)   Embolic stroke ([place])    Remains on Xarelto. On high-dose statin.      Relevant Orders   EKG 12-Lead (Completed)   CAD-PCI OM1, 100% RCA (L-R colllaterals) - Primary (Chronic)    No further of her anginal equivalent left-sided jaw/cheek pain. Remains on beta blocker and statin.  Not on aspirin or Plavix because of Xarelto.      Relevant Orders   EKG 12-Lead (Completed)      Current medicines are reviewed at length with the patient today. (+/- concerns) ? How to take Lasix The following changes have been made:  Medication Instructions:  Your physician has recommended you make the following change in your medication:  CONTINUE taking lasix 20mg  daily. You may take 40mg  daily as needed for increased swelling    Studies Ordered:   Orders Placed This Encounter  Procedures  . EKG 12-Lead      Glenetta Hew, M.D., M.S. Interventional Cardiologist   Pager # [phone] Phone # [phone] [address] [place], [place] [address]

## 2015-08-26 LAB — CULTURE, BLOOD (SINGLE): Organism ID, Bacteria: NO GROWTH

## 2015-08-27 NOTE — Assessment & Plan Note (Signed)
[place] tell. No further recurrences of rapid heartbeats or syncope/near syncope. Aspirin continue low-dose beta blocker.

## 2015-08-27 NOTE — Assessment & Plan Note (Signed)
Remains on Xarelto. On high-dose statin.

## 2015-08-27 NOTE — Assessment & Plan Note (Addendum)
With orthostatic hypotension and neurocardiac syncope, will allow for mild permissive hypertension and not push blood pressure control to much. Continue to recommend adequate hydration and support stockings/compression stockings.

## 2015-08-27 NOTE — Assessment & Plan Note (Signed)
No further of her anginal equivalent left-sided jaw/cheek pain. Remains on beta blocker and statin. Not on aspirin or Plavix because of Xarelto.

## 2015-08-27 NOTE — Assessment & Plan Note (Signed)
Pretty stable. The mucosa management. On Lopressor at low dose.

## 2015-08-27 NOTE — Assessment & Plan Note (Signed)
Labs just checked in March. Not quite at goal with LDL of 106. She is on Crestor at increased dose since her stroke. I'm inclined to continue to monitor and adjust further.

## 2015-09-02 ENCOUNTER — Other Ambulatory Visit: Payer: Self-pay | Admitting: Surgical

## 2015-09-02 MED ORDER — ROPINIROLE HCL 0.5 MG PO TABS: ORAL_TABLET | ORAL | Status: DC

## 2015-09-21 DIAGNOSIS — J300 Vasomotor rhinitis: Secondary | ICD-10-CM | POA: Diagnosis not present

## 2015-09-21 DIAGNOSIS — K219 Gastro-esophageal reflux disease without esophagitis: Secondary | ICD-10-CM | POA: Diagnosis not present

## 2015-09-29 ENCOUNTER — Ambulatory Visit (INDEPENDENT_AMBULATORY_CARE_PROVIDER_SITE_OTHER)
Admission: RE | Admit: 2015-09-29 | Discharge: 2015-09-29 | Disposition: A | Discharge status: Home / Self Care | Payer: Medicare Other | Source: Ambulatory Visit | Attending: Family Medicine | Admitting: Family Medicine

## 2015-09-29 ENCOUNTER — Encounter: Payer: Self-pay | Admitting: Family Medicine

## 2015-09-29 ENCOUNTER — Ambulatory Visit (INDEPENDENT_AMBULATORY_CARE_PROVIDER_SITE_OTHER): Payer: Medicare Other | Admitting: Family Medicine

## 2015-09-29 ENCOUNTER — Telehealth: Payer: Self-pay | Admitting: *Deleted

## 2015-09-29 ENCOUNTER — Telehealth: Payer: Self-pay | Admitting: Family Medicine

## 2015-09-29 VITALS — BP 155/74 | HR 76 | Temp 98.5°F

## 2015-09-29 DIAGNOSIS — R079 Chest pain, unspecified: Secondary | ICD-10-CM

## 2015-09-29 DIAGNOSIS — R0781 Pleurodynia: Secondary | ICD-10-CM

## 2015-09-29 MED ORDER — BENZONATATE 200 MG PO CAPS: 200.0000 mg | ORAL_CAPSULE | Freq: Two times a day (BID) | ORAL | 0 refills | Status: DC | PRN

## 2015-09-29 NOTE — Telephone Encounter (Signed)
Please contact the patient to see what is going on. Thanks.

## 2015-09-29 NOTE — Telephone Encounter (Signed)
Noted  

## 2015-09-29 NOTE — Progress Notes (Signed)
Pre visit review using our clinic review tool, if applicable. No additional management support is needed unless otherwise documented below in the visit note. Pt unable to weigh 

## 2015-09-29 NOTE — Telephone Encounter (Signed)
FYI

## 2015-09-29 NOTE — Telephone Encounter (Signed)
Spoke with patient and her daughter. They was on the way to be seen at [facility]. She stated that the pain has went away since last night. She is having pain in the rib cage under the arm per the patient. Denies any other symptoms and falling.

## 2015-09-29 NOTE — Telephone Encounter (Signed)
Awaiting the [place] note for details and then will call patient, thanks

## 2015-09-29 NOTE — Progress Notes (Signed)
   Subjective:    Patient ID: Abigail Boyd, female    DOB: Sep 21, 1929, 82 y.o.   MRN: [id number]  HPI Here with her daughter for 3 days of sharp pain in the left side. No known trauma, although she does have a chronic cough. This cough os non-productive. She had an unremarkable CXR a few weeks ago. The pain is worse when twisting her trunk or when lying in certain positions. No SOB or fever.    Review of Systems  Constitutional: Negative.   Respiratory: Positive for cough. Negative for apnea, choking, chest tightness, shortness of breath, wheezing and stridor.   Cardiovascular: Positive for chest pain. Negative for palpitations and leg swelling.  Gastrointestinal: Negative.   Neurological: Negative.        Objective:   Physical Exam  Constitutional: She is oriented to person, place, and time. She appears well-developed and well-nourished.  Alert, walks with her walker   Neck: Neck supple. No thyromegaly present.  Cardiovascular: Normal rate, regular rhythm, normal heart sounds and intact distal pulses.   Pulmonary/Chest: Effort normal and breath sounds normal. No respiratory distress. She has no wheezes. She has no rales.  Tender over the left lateral ribs just below the axilla, no swelling or crepitus   Lymphadenopathy:    She has no cervical adenopathy.  Neurological: She is alert and oriented to person, place, and time.  Skin: No rash noted.          Assessment & Plan:  This pain is musculoskeletal in origin, possibly a pulled muscle or a cracked rib. We will send her for rib Xrays today. She will use Tylenol for pain. Try Benzonatate for the cough.  Laurey Morale, MD

## 2015-09-29 NOTE — Telephone Encounter (Signed)
Patient Name: Abigail Boyd DOB: 04/20/1929 Initial Comment Mother has pain in left side, rib cage. Couldn't sleep last night. Has HX of heart attack. Nurse Assessment Nurse: Vallery Sa, RN, Cathy Date/Time (Eastern Time): 09/29/2015 12:12:22 PM Confirm and document reason for call. If symptomatic, describe symptoms. You must click the next button to save text entered. ---Caller states that Abigail Boyd developed left rib pain (rated as moderate) last night yesterday and was unable to sleep last night. No severe breathing difficulty. No injury in the past 3 days. No fever. Alert and responsive. Has the patient traveled out of the country within the last 30 days? ---No Does the patient have any new or worsening symptoms? ---Yes Will a triage be completed? ---Yes Related visit to physician within the last 2 weeks? ---No Does the PT have any chronic conditions? (i.e. diabetes, asthma, etc.) ---Yes List chronic conditions. ---Heart attack several years ago, Diabetes, A-fib, GERD, Allergies Is this a behavioral health or substance abuse call? ---No Guidelines Guideline Title Affirmed Question Affirmed Notes Chest Pain Chest pain lasts > 5 minutes (Exceptions: chest pain occurring > 3 days ago and now asymptomatic; same as previously diagnosed heartburn and has accompanying sour taste in mouth) Final Disposition User Go to ED Now (or PCP triage) Vallery Sa, RN, Cathy Referrals REFERRED TO PCP OFFICE Disagree/Comply: Leta Baptist

## 2015-09-29 NOTE — Telephone Encounter (Signed)
FYI Patients daughter stated that pt is having left side pain, to the point of crying . Daughter transferred to nurse line

## 2015-10-11 ENCOUNTER — Ambulatory Visit (INDEPENDENT_AMBULATORY_CARE_PROVIDER_SITE_OTHER): Payer: Medicare Other | Admitting: Family Medicine

## 2015-10-11 VITALS — BP 132/76 | HR 78 | Temp 98.3°F | Ht 64.0 in | Wt 186.6 lb

## 2015-10-11 DIAGNOSIS — R300 Dysuria: Secondary | ICD-10-CM | POA: Diagnosis not present

## 2015-10-11 DIAGNOSIS — N3001 Acute cystitis with hematuria: Secondary | ICD-10-CM

## 2015-10-11 DIAGNOSIS — N390 Urinary tract infection, site not specified: Secondary | ICD-10-CM | POA: Insufficient documentation

## 2015-10-11 LAB — POCT URINALYSIS DIPSTICK
Bilirubin, UA: NEGATIVE
Blood, UA: 200
Glucose, UA: NEGATIVE
Ketones, UA: NEGATIVE
Nitrite, UA: NEGATIVE
Protein, UA: 300
Spec Grav, UA: 1.02
Urobilinogen, UA: 0.2
pH, UA: 7

## 2015-10-11 LAB — URINALYSIS, MICROSCOPIC ONLY: RBC / HPF: NONE SEEN (ref 0–?)

## 2015-10-11 MED ORDER — CEPHALEXIN 500 MG PO CAPS: 500.0000 mg | ORAL_CAPSULE | Freq: Two times a day (BID) | ORAL | 0 refills | Status: DC

## 2015-10-11 NOTE — Assessment & Plan Note (Signed)
Symptoms and UA most consistent with UTI. We'll proceed with Keflex. Send urine for culture and microscopy. Given return precautions.

## 2015-10-11 NOTE — Progress Notes (Signed)
  Tommi Rumps, MD Phone: [phone]  Abigail Boyd is a 82 y.o. female who presents today for same-day visit.  UTI: Patient notes 1 day fish area, urinary frequency, and urinary urgency. No hematuria. Notes it feels like her prior UTIs. No vaginal discharge. No vaginal itching. Notes mild suprapubic discomfort. Is status post hysterectomy and BSO. Status post appendectomy.   ROS see history of present illness  Objective  Physical Exam Vitals:   10/11/15 1305  BP: 132/76  Pulse: 78  Temp: 98.3 F (36.8 C)    BP Readings from Last 3 Encounters:  10/11/15 132/76  09/29/15 (!) 155/74  08/25/15 (!) 140/56   Wt Readings from Last 3 Encounters:  10/11/15 186 lb 9.6 oz (84.6 kg)  08/25/15 182 lb 4 oz (82.7 kg)  08/19/15 186 lb (84.4 kg)    Physical Exam  Constitutional: No distress.  Cardiovascular: Normal rate, regular rhythm and normal heart sounds.   Pulmonary/Chest: Effort normal and breath sounds normal.  Abdominal: Soft. Bowel sounds are normal. She exhibits no distension. There is tenderness (mild suprapubic tenderness). There is no rebound and no guarding.  Neurological: She is alert.  Skin: Skin is warm and dry. She is not diaphoretic.     Assessment/Plan: Please see individual problem list.  UTI (urinary tract infection) Symptoms and UA most consistent with UTI. We'll proceed with Keflex. Send urine for culture and microscopy. Given return precautions.   Orders Placed This Encounter  Procedures  . Urine Culture  . Urine Microscopic Only  . POCT Urinalysis Dipstick    Meds ordered this encounter  Medications  . cephALEXin (KEFLEX) 500 MG capsule    Sig: Take 1 capsule (500 mg total) by mouth 2 (two) times daily.    Dispense:  14 capsule    Refill:  0    Tommi Rumps, MD [facility]

## 2015-10-11 NOTE — Patient Instructions (Signed)
Nice to see you. You likely have a UTI. We will treat this with Keflex. If you develop worsening abdominal discomfort, blood in your urine, fevers, chills, started feel sick, or any new or changing symptoms please seek medical attention.

## 2015-10-11 NOTE — Progress Notes (Signed)
Pre visit review using our clinic review tool, if applicable. No additional management support is needed unless otherwise documented below in the visit note. 

## 2015-10-12 LAB — URINE CULTURE
Colony Count: >=100000
Organism ID, Bacteria: 10000

## 2015-10-13 ENCOUNTER — Ambulatory Visit: Payer: Medicare Other | Admitting: Neurology

## 2015-10-19 ENCOUNTER — Telehealth: Payer: Self-pay | Admitting: *Deleted

## 2015-10-19 NOTE — Telephone Encounter (Signed)
Abigail Boyd [phone]

## 2015-10-19 NOTE — Telephone Encounter (Signed)
Patient's daughter has been notified  

## 2015-10-19 NOTE — Telephone Encounter (Signed)
Finish course of antibiotic.

## 2015-10-19 NOTE — Telephone Encounter (Signed)
Spoke to daughter of patient she stated that she was placed on antibiotics for a UTI. Once the Culture returned back negative patient was told to stop taking medication.  Few days later after being told to stop patient is experiencing burning and blood in urine.  Patients daughter says that the [place] are like a UTI for patient.  So patients started her medications back up last night at midnight and seems to be helping. Please advise.

## 2015-10-19 NOTE — Telephone Encounter (Signed)
Patients daughter stated that pt was seen in the office on 10/11/15,She was being seen for a UTI- she started a antibiotic, prior to cultures returning, the culture was negative. She stopped antibiotic when advised.  Currently She has burning and blood in urine, her daughter continued the antibiotic at midnight, due to symptoms.  Please contact [name] her daughter for consult [phone]

## 2015-10-20 DIAGNOSIS — N952 Postmenopausal atrophic vaginitis: Secondary | ICD-10-CM | POA: Diagnosis not present

## 2015-10-20 DIAGNOSIS — N362 Urethral caruncle: Secondary | ICD-10-CM | POA: Diagnosis not present

## 2015-10-20 DIAGNOSIS — R310 Gross hematuria: Secondary | ICD-10-CM | POA: Diagnosis not present

## 2015-10-22 DIAGNOSIS — R310 Gross hematuria: Secondary | ICD-10-CM | POA: Diagnosis not present

## 2015-11-07 ENCOUNTER — Other Ambulatory Visit: Payer: Self-pay | Admitting: Family Medicine

## 2015-11-11 ENCOUNTER — Other Ambulatory Visit: Payer: Self-pay | Admitting: Surgical

## 2015-11-11 NOTE — Progress Notes (Signed)
Can we refill this medication. Historical provider

## 2015-11-11 NOTE — Progress Notes (Signed)
Please confirm that the patient is taking this. Thanks.

## 2015-11-15 MED ORDER — PAROXETINE HCL 10 MG PO TABS: 10.0000 mg | ORAL_TABLET | Freq: Every day | ORAL | 3 refills | Status: DC

## 2015-11-15 NOTE — Progress Notes (Signed)
Spoke with patients daughter and she has been taking this medication for 4 years. Dr. Hardin Negus put the patient on this.

## 2015-11-15 NOTE — Progress Notes (Signed)
Noted. We will need to discuss this at her next visit.

## 2015-11-18 ENCOUNTER — Ambulatory Visit (INDEPENDENT_AMBULATORY_CARE_PROVIDER_SITE_OTHER): Payer: Medicare Other | Admitting: Family Medicine

## 2015-11-18 ENCOUNTER — Other Ambulatory Visit: Payer: Self-pay

## 2015-11-18 ENCOUNTER — Encounter: Payer: Self-pay | Admitting: Family Medicine

## 2015-11-18 ENCOUNTER — Emergency Department: Payer: Medicare Other

## 2015-11-18 ENCOUNTER — Telehealth: Payer: Self-pay | Admitting: Family Medicine

## 2015-11-18 ENCOUNTER — Encounter: Payer: Self-pay | Admitting: Emergency Medicine

## 2015-11-18 ENCOUNTER — Emergency Department
Admission: EM | Admit: 2015-11-18 | Discharge: 2015-11-18 | Disposition: A | Discharge status: Home / Self Care | Payer: Medicare Other | Attending: Student in an Organized Health Care Education/Training Program | Admitting: Student in an Organized Health Care Education/Training Program

## 2015-11-18 VITALS — BP 124/60 | HR 84 | Temp 98.7°F | Wt 188.8 lb

## 2015-11-18 DIAGNOSIS — N182 Chronic kidney disease, stage 2 (mild): Secondary | ICD-10-CM | POA: Insufficient documentation

## 2015-11-18 DIAGNOSIS — Z7984 Long term (current) use of oral hypoglycemic drugs: Secondary | ICD-10-CM | POA: Insufficient documentation

## 2015-11-18 DIAGNOSIS — R531 Weakness: Secondary | ICD-10-CM | POA: Diagnosis not present

## 2015-11-18 DIAGNOSIS — E1122 Type 2 diabetes mellitus with diabetic chronic kidney disease: Secondary | ICD-10-CM | POA: Insufficient documentation

## 2015-11-18 DIAGNOSIS — Z794 Long term (current) use of insulin: Secondary | ICD-10-CM | POA: Diagnosis not present

## 2015-11-18 DIAGNOSIS — R0602 Shortness of breath: Secondary | ICD-10-CM

## 2015-11-18 DIAGNOSIS — I2510 Atherosclerotic heart disease of native coronary artery without angina pectoris: Secondary | ICD-10-CM | POA: Insufficient documentation

## 2015-11-18 DIAGNOSIS — N951 Menopausal and female climacteric states: Secondary | ICD-10-CM | POA: Insufficient documentation

## 2015-11-18 DIAGNOSIS — I129 Hypertensive chronic kidney disease with stage 1 through stage 4 chronic kidney disease, or unspecified chronic kidney disease: Secondary | ICD-10-CM | POA: Insufficient documentation

## 2015-11-18 DIAGNOSIS — R232 Flushing: Secondary | ICD-10-CM

## 2015-11-18 LAB — CBC WITH DIFFERENTIAL/PLATELET
BASOS ABS: 0.1 10*3/uL (ref 0–0.1)
Basophils Relative: 1 %
EOS PCT: 1 %
Eosinophils Absolute: 0.1 10*3/uL (ref 0–0.7)
HEMATOCRIT: 36.4 % (ref 35.0–47.0)
HEMOGLOBIN: 12.2 g/dL (ref 12.0–16.0)
LYMPHS ABS: 1.3 10*3/uL (ref 1.0–3.6)
LYMPHS PCT: 14 %
MCH: 30.3 pg (ref 26.0–34.0)
MCHC: 33.5 g/dL (ref 32.0–36.0)
MCV: 90.5 fL (ref 80.0–100.0)
Monocytes Absolute: 0.7 10*3/uL (ref 0.2–0.9)
Monocytes Relative: 7 %
NEUTROS ABS: 7.3 10*3/uL — AB (ref 1.4–6.5)
NEUTROS PCT: 77 %
PLATELETS: 262 10*3/uL (ref 150–440)
RBC: 4.02 MIL/uL (ref 3.80–5.20)
RDW: 15.5 % — ABNORMAL HIGH (ref 11.5–14.5)
WBC: 9.4 10*3/uL (ref 3.6–11.0)

## 2015-11-18 LAB — TROPONIN I

## 2015-11-18 LAB — URINALYSIS COMPLETE WITH MICROSCOPIC (ARMC ONLY)
Bacteria, UA: NONE SEEN
Bilirubin Urine: NEGATIVE
GLUCOSE, UA: NEGATIVE mg/dL
HGB URINE DIPSTICK: NEGATIVE
Ketones, ur: NEGATIVE mg/dL
Leukocytes, UA: NEGATIVE
Nitrite: NEGATIVE
PH: 6 (ref 5.0–8.0)
Protein, ur: NEGATIVE mg/dL
Specific Gravity, Urine: 1.014 (ref 1.005–1.030)

## 2015-11-18 LAB — COMPREHENSIVE METABOLIC PANEL
ALK PHOS: 65 U/L (ref 38–126)
ALT: 16 U/L (ref 14–54)
AST: 20 U/L (ref 15–41)
Albumin: 3.9 g/dL (ref 3.5–5.0)
Anion gap: 7 (ref 5–15)
BUN: 23 mg/dL — AB (ref 6–20)
CALCIUM: 9.3 mg/dL (ref 8.9–10.3)
CHLORIDE: 102 mmol/L (ref 101–111)
CO2: 27 mmol/L (ref 22–32)
CREATININE: 1.16 mg/dL — AB (ref 0.44–1.00)
GFR calc Af Amer: 49 mL/min — ABNORMAL LOW (ref >60)
GFR, EST NON AFRICAN AMERICAN: 42 mL/min — AB (ref >60)
Glucose, Bld: 257 mg/dL — ABNORMAL HIGH (ref 65–99)
Potassium: 4.5 mmol/L (ref 3.5–5.1)
Sodium: 136 mmol/L (ref 135–145)
Total Bilirubin: 0.3 mg/dL (ref 0.3–1.2)
Total Protein: 7.4 g/dL (ref 6.5–8.1)

## 2015-11-18 NOTE — ED Triage Notes (Signed)
Pt to ED after waking up this morning with clammy feeling, hot sweats, slight nausea and shortness of breath.  Pt has history of atrial fibrillation.  In triage, pt appears in no distress and denies symptoms.

## 2015-11-18 NOTE — Telephone Encounter (Signed)
[facility] Patient Name: RAYNITA NEMETH DOB: 1929-03-23 Initial Comment Caller states mother feeling short of breath Nurse Assessment Nurse: Dimas Chyle, RN, Dellis Filbert Date/Time (Eastern Time): 11/18/2015 11:23:39 AM Confirm and document reason for call. If symptomatic, describe symptoms. You must click the next button to save text entered. ---Caller states mother feeling short of breath. Symptoms started this morning. Has the patient traveled out of the country within the last 30 days? ---No Does the patient have any new or worsening symptoms? ---Yes Will a triage be completed? ---Yes Related visit to physician within the last 2 weeks? ---No Does the PT have any chronic conditions? (i.e. diabetes, asthma, etc.) ---Yes List chronic conditions. ---Diabetes type 2 and afib Is this a behavioral health or substance abuse call? ---No Guidelines Guideline Title Affirmed Question Affirmed Notes Breathing Difficulty [1] MILD difficulty breathing (e.g., minimal/no SOB at rest, SOB with walking, pulse <100) AND [2] NEW-onset or WORSE than normal Final Disposition User See Physician within 4 Hours (or PCP triage) Dimas Chyle, RN, Dellis Filbert Referrals REFERRED TO PCP OFFICE Disagree/Comply: Leta Baptist

## 2015-11-18 NOTE — Assessment & Plan Note (Addendum)
Patient with acute onset shortness of breath this morning associated with diaphoresis and clamminess. Concern is that this could be related to a cardiac cause particularly given her history of CAD and total occlusion RCA. No obvious abnormalities on lung exam. EKG with nonspecific ST depressions. Asymptomatic at this time. Given symptomatology, patient history, and nonspecific changes on EKG we'll recommend the patient go to the emergency room for evaluation. Given that she is asymptomatic now I feel that she is safe to be transported by her family at this time. Advised that if she develops any symptoms in route to call 911. CMA will call the charge nurse to inform them she is on her way.

## 2015-11-18 NOTE — ED Notes (Signed)
Pt ambulated with assist to the toilet for urine sample, then back again using her walker. Her home health aide assisted her as well.

## 2015-11-18 NOTE — Telephone Encounter (Signed)
FYI: pt on your schedule for today

## 2015-11-18 NOTE — Progress Notes (Signed)
Pre visit review using our clinic review tool, if applicable. No additional management support is needed unless otherwise documented below in the visit note. 

## 2015-11-18 NOTE — Discharge Instructions (Signed)
Please follow-up with your primary care physician. Be sure to drink plenty of fluids. Return to the ER should she have any recurrence of symptoms or concerns.

## 2015-11-18 NOTE — ED Notes (Signed)
Pt states had an episode of feeling hot and sweaty on awaking. Per daughter these are the same symptoms pt has when her afib occurs. Pt went to her pcp - not in afib but they sent her in for eval. Pt place on monitor. No pain or distress at this time.

## 2015-11-18 NOTE — ED Provider Notes (Signed)
[facility] Emergency Department Provider Note    First MD Initiated Contact with Patient 11/18/15 1600     (approximate)  I have reviewed the triage vital signs and the nursing notes.   HISTORY  Chief Complaint Irregular Heart Beat    HPI Abigail Boyd is a 82 y.o. female  with a history of brittle diabetes presents due to brief episode of clamminess and feeling flushed this morning before breakfast. She denies any chest pain or shortness of breath. Does have a history of A. fib as well as a history of CAD but denies any pain similar to that episode. During her previous MI she states that she had jaw pain and chest pressure. Denies any discomfort or pressure throughout the day. Episode of clamminess started around 8 AM. She lives at home with her daughter and her caretaker brought a cool cloth and some water. After using a cool cloth and drinking water she felt better.  She denies any discomfort, nausea, headache.     Past Medical History:  Diagnosis Date  . Abnormal nuclear stress test 06/21/2011   Inferolateral reversible defect;--> cardiac cath & PCI of CxOM, occluded RCA with collaterals;; followup Myoview 11/2012: Low risk. Fixed basal inferior artifact normal EF. No ischemia  . Anxiety    When necessary Xanax  . Bilateral cataracts    Status post stroke or correction  . CAD S/P percutaneous coronary angioplasty    s/P PCI to proximal OM1 w/ DES; occluded RCA with bridging and L-R collaterals (medical management)  . Chronic kidney disease (CKD), stage II (mild)    Related to current bladder infections (although diabetes cannot be excluded)  . Chronotropic incompetence with sinus node dysfunction [facility]) October 2013   On CPET test; beta blockers reduced  . Diabetes mellitus type 2, controlled ([place])    On oral medications  . Diverticulitis   . Essential hypertension    Allowing for permissive hypertension to avoid orthostatic hypotension  . GERD  (gastroesophageal reflux disease)    On PPI  . Glaucoma   . History of syncope    Per EP - neurocardiogenic & not Bradycardia related (no PPM)  . History of unstable angina 06/13/2011   Jaw pain awakening from sleep -- Myoview --CATH --> PCI  . Hyperlipidemia with target LDL less than 70    HDL at goal, LDL not at goal, borderline triglycerides. On Crestor 20 mg  . Migraines   . OSA (obstructive sleep apnea)    hx bladder infections  . Osteoarthritis   . PAF (paroxysmal atrial fibrillation) ([place]) 10-11/ 2014   CardioNet Event Monitor: NSR & S Brady -- Rates 50-100; Total A. fib burden 35 hours and 27 minutes. 1296 episodes, longest was 1 hour 29 minutes. Rate ranged from 52-169 beats per minute.  . Seasonal allergies   . Shortness of breath on exertion October 2013   2-D echo: Normal EF>55%, Gr 1DD, mild aortic sclerosis; Evaluated with CPET - peak VO2 97%; Chronotropic Incompetence (submaximal effort)  . Spinal stenosis of lumbar region 11/2011  . Stroke ([place])   . Tachycardia-bradycardia syndrome ([place]) 11/2012  . Urge incontinence     Patient Active Problem List   Diagnosis Date Noted  . Shortness of breath 11/18/2015  . UTI (urinary tract infection) 10/11/2015  . Feels feverish 08/19/2015  . Orthostatic hypotension 05/30/2015  . Embolic stroke ([place]) [id number]  . Neurocardiogenic syncope 04/23/2015  . Urge incontinence 03/15/2015  . Bilateral lower extremity edema  03/15/2015  . Constipation 03/15/2015  . Right knee pain 03/15/2015  . Hematochezia 03/15/2015  . Cerebrovascular accident (CVA) ([place])   . CKD (chronic kidney disease), stage II   . Preop cardiovascular exam 06/10/2014  . PLMD (periodic limb movement disorder) 06/12/2013  . Obstructive sleep apnea 03/05/2013  . Tachy-brady syndrome: Rates range from 50-169 bpm in A. fib 12/20/2012  . History of syncope: Unclear etiology 12/19/2012  . Chronotropic incompetence - partially medication related 08/18/2012  . PAF  (paroxysmal atrial fibrillation) ([place]) 05/16/2012    Class: Diagnosis of  . Long term (current) use of anticoagulants 05/16/2012  . CAD-PCI OM1, 100% RCA (L-R colllaterals) 07/07/2011  . Hyperlipidemia with target LDL less than 70     Class: Diagnosis of  . Essential hypertension     Class: Diagnosis of  . Chronic kidney disease     Class: Diagnosis of  . Spinal stenosis     Class: History of  . Diabetes mellitus (HCC)     Class: Diagnosis of  . Unstable angina - history of; resolved, status post PCI of the OM 06/13/2011    Class: History of    Past Surgical History:  Procedure Laterality Date  . APPENDECTOMY    . BREAST BIOPSY     both breast  . CARDIAC CATHETERIZATION  07/05/2011   Unstable Angina --> Myoview wiht Inf-Lat Ischemia.  Proximal OM1 lesion --> PCI; 100% mid RCA with right to right and left to right bridging collaterals from circumflex RPL and LAD the PDA.  . cataracts     both eyes  . COLONOSCOPY    . CORONARY ANGIOPLASTY WITH STENT PLACEMENT  07/05/2011   Promus Element DES 2.5 mm x 16 mm-post dilated to 2.65 mm CX-Proximal OM1  . KNEE ARTHROSCOPY WITH MEDIAL MENISECTOMY Right 06/24/2014   Procedure: RIGHT KNEE ARTHROSCOPY WITH partial lateral MENISECTOMY, abrasion chondroplasty of medial femoral condyle and patella, microfracture technique;  Surgeon: Latanya Maudlin, MD;  Location: [facility] ORS;  Service: Orthopedics;  Laterality: Right;  . LEFT HEART CATHETERIZATION WITH CORONARY ANGIOGRAM N/A 07/06/2011   Procedure: LEFT HEART CATHETERIZATION WITH CORONARY ANGIOGRAM;  Surgeon: Leonie Man, MD;  Location: [facility] CATH LAB;  Service: Cardiovascular;  Laterality: N/A;  . NM MYOVIEW LTD  October 2014    Low risk. Fixed basal inferior artifact normal EF. No ischemia --> (as compared to pre-PCI Myoview revealing inferolateral ischemia)  . TRANSTHORACIC ECHOCARDIOGRAM  11/2014   EF 65-70%, Mod LVH. Normal wall motion. Gr 1 DD. Normal valves.  Marland Kitchen VAGINAL HYSTERECTOMY      Prior  to Admission medications   Medication Sig Start Date End Date Taking? Authorizing Provider  acetaminophen (TYLENOL) 650 MG CR tablet Take 1,300 mg by mouth 2 (two) times daily as needed for pain.    Historical Provider, MD  ALPRAZolam Duanne Moron) 0.25 MG tablet Take 1 tablet (0.25 mg total) by mouth at bedtime as needed for sleep or anxiety. 12/29/14   Nita Sells, MD  benzonatate (TESSALON) 200 MG capsule Take 1 capsule (200 mg total) by mouth 2 (two) times daily as needed for cough. 09/29/15   Laurey Morale, MD  cephALEXin (KEFLEX) 500 MG capsule Take 1 capsule (500 mg total) by mouth 2 (two) times daily. 10/11/15   Leone Haven, MD  cholecalciferol (VITAMIN D) 1000 UNITS tablet Take 1,000 Units by mouth daily.    Historical Provider, MD  docusate sodium (STOOL SOFTENER) 100 MG capsule Take 100 mg by mouth daily.  Historical Provider, MD  fexofenadine (ALLEGRA) 180 MG tablet Take 180 mg by mouth at bedtime.     Historical Provider, MD  fish oil-omega-3 fatty acids 1000 MG capsule Take 1 g by mouth daily.     Historical Provider, MD  furosemide (LASIX) 20 MG tablet TAKE 1 TABLET BY MOUTH EVERY DAY AS NEEDED THEN 2 TABLETS IF SWELLING Patient taking differently: Take 40 mg by mouth daily. TAKE 1 TABLET BY MOUTH EVERY DAY AS NEEDED THEN 2 TABLETS IF SWELLING 07/05/15   Leonie Man, MD  Insulin Glargine (LANTUS) 100 UNIT/ML Solostar Pen Inject 60 Units into the skin.  03/31/15   Historical Provider, MD  insulin lispro (HUMALOG KWIKPEN) 100 UNIT/ML KiwkPen Inject 18 Units into the skin 3 (three) times daily as needed (CBG >200).     Historical Provider, MD  ipratropium (ATROVENT) 0.06 % nasal spray Place 1 spray into the nose daily.  12/08/10   Historical Provider, MD  latanoprost (XALATAN) 0.005 % ophthalmic solution Instill one drop into each eye every night at bedtime    Historical Provider, MD  levocetirizine (XYZAL) 5 MG tablet Take 5 mg by mouth every evening.  03/22/15   Historical  Provider, MD  LUMIGAN 0.01 % SOLN Place 1 drop into both eyes at bedtime.  03/06/15   Historical Provider, MD  Magnesium 500 MG CAPS Take 1,000 mg by mouth daily as needed (constipation).     Historical Provider, MD  metoprolol tartrate (LOPRESSOR) 25 MG tablet Take 1 tablet (25 mg total) by mouth 2 (two) times daily. HOLD for SBP <110 and HR < 60 05/26/15   Leonie Man, MD  mirabegron ER (MYRBETRIQ) 50 MG TB24 tablet Take one tablet by mouth once daily for kidneys    Historical Provider, MD  nitroGLYCERIN (NITROSTAT) 0.4 MG SL tablet Place 0.4 mg under the tongue every 5 (five) minutes as needed for chest pain.    Historical Provider, MD  nystatin cream (MYCOSTATIN) Apply 1 application topically daily. 12/03/14   Historical Provider, MD  omeprazole (PRILOSEC) 20 MG capsule Take 20 mg by mouth daily.  02/28/15   Historical Provider, MD  ONE TOUCH ULTRA TEST test strip 1 each by Other route as needed.  03/18/15   Historical Provider, MD  PARoxetine (PAXIL) 10 MG tablet Take 1 tablet (10 mg total) by mouth daily. 11/15/15   Leone Haven, MD  Rivaroxaban (XARELTO) 15 MG TABS tablet Take 1 tablet (15 mg total) by mouth daily with supper. 07/05/15   Leonie Man, MD  rOPINIRole (REQUIP) 0.5 MG tablet Take 1 tablet by mouth at  bedtime 11/08/15   Leone Haven, MD  rosuvastatin (CRESTOR) 40 MG tablet Take 1 tablet (40 mg total) by mouth daily. 05/26/15   Leonie Man, MD  sitaGLIPtin (JANUVIA) 50 MG tablet Take 50 mg by mouth at bedtime.     Historical Provider, MD  VESICARE 5 MG tablet TAKE 1 TABLET (5 MG TOTAL) BY MOUTH ONCE DAILY. 03/30/15   Historical Provider, MD    Allergies Lisinopril; Losartan; and Sulfa antibiotics  Family History  Problem Relation Age of Onset  . COPD Mother   . Pulmonary fibrosis Mother   . Heart attack Father   . Heart disease Father   . Stroke Father   . Cancer Sister   . Cancer Sister   . Clotting disorder Sister   . Heart attack Sister   . Arthritis      . Breast cancer  Sister   . Colon cancer Neg Hx   . Esophageal cancer Neg Hx   . Stomach cancer Neg Hx     Social History Social History  Substance Use Topics  . Smoking status: Never Smoker  . Smokeless tobacco: Never Used  . Alcohol use No    Review of Systems Patient denies headaches, rhinorrhea, blurry vision, numbness, shortness of breath, chest pain, edema, cough, abdominal pain, nausea, vomiting, diarrhea, dysuria, fevers, rashes or hallucinations unless otherwise stated above in HPI. ____________________________________________   PHYSICAL EXAM:  VITAL SIGNS: Vitals:   11/18/15 1416  BP: 136/61  Pulse: 76  Resp: 20  Temp: 98 F (36.7 C)    Constitutional: Alert and oriented. Well appearing and in no acute distress. Eyes: Conjunctivae are normal. PERRL. EOMI. Head: Atraumatic. Nose: No congestion/rhinnorhea. Mouth/Throat: Mucous membranes are moist.  Oropharynx non-erythematous. Neck: No stridor. Painless ROM. No cervical spine tenderness to palpation Hematological/Lymphatic/Immunilogical: No cervical lymphadenopathy. Cardiovascular: Normal rate, regular rhythm. Grossly normal heart sounds.  Good peripheral circulation. Respiratory: Normal respiratory effort.  No retractions. Faint bibasilar crackles. Gastrointestinal: Soft and nontender. No distention. No abdominal bruits. No CVA tenderness. Musculoskeletal: No lower extremity tenderness, 1+ bilateral edema.  No joint effusions. Neurologic:  Normal speech and language. No gross focal neurologic deficits are appreciated. No gait instability. Skin:  Skin is warm, dry and intact. No rash noted. Psychiatric: Mood and affect are normal. Speech and behavior are normal.  ____________________________________________   LABS (all labs ordered are listed, but only abnormal results are displayed)  Results for orders placed or performed during the hospital encounter of 11/18/15 (from the past 24 hour(s))  CBC with  Differential/Platelet     Status: Abnormal   Collection Time: 11/18/15  4:01 PM  Result Value Ref Range   WBC 9.4 3.6 - 11.0 K/uL   RBC 4.02 3.80 - 5.20 MIL/uL   Hemoglobin 12.2 12.0 - 16.0 g/dL   HCT 36.4 35.0 - 47.0 %   MCV 90.5 80.0 - 100.0 fL   MCH 30.3 26.0 - 34.0 pg   MCHC 33.5 32.0 - 36.0 g/dL   RDW 15.5 (H) 11.5 - 14.5 %   Platelets 262 150 - 440 K/uL   Neutrophils Relative % 77 %   Neutro Abs 7.3 (H) 1.4 - 6.5 K/uL   Lymphocytes Relative 14 %   Lymphs Abs 1.3 1.0 - 3.6 K/uL   Monocytes Relative 7 %   Monocytes Absolute 0.7 0.2 - 0.9 K/uL   Eosinophils Relative 1 %   Eosinophils Absolute 0.1 0 - 0.7 K/uL   Basophils Relative 1 %   Basophils Absolute 0.1 0 - 0.1 K/uL  Comprehensive metabolic panel     Status: Abnormal   Collection Time: 11/18/15  4:01 PM  Result Value Ref Range   Sodium 136 135 - 145 mmol/L   Potassium 4.5 3.5 - 5.1 mmol/L   Chloride 102 101 - 111 mmol/L   CO2 27 22 - 32 mmol/L   Glucose, Bld 257 (H) 65 - 99 mg/dL   BUN 23 (H) 6 - 20 mg/dL   Creatinine, Ser 1.16 (H) 0.44 - 1.00 mg/dL   Calcium 9.3 8.9 - 10.3 mg/dL   Total Protein 7.4 6.5 - 8.1 g/dL   Albumin 3.9 3.5 - 5.0 g/dL   AST 20 15 - 41 U/L   ALT 16 14 - 54 U/L   Alkaline Phosphatase 65 38 - 126 U/L   Total Bilirubin 0.3 0.3 - 1.2 mg/dL  GFR calc non Af Amer 42 (L) >60 mL/min   GFR calc Af Amer 49 (L) >60 mL/min   Anion gap 7 5 - 15  Troponin I     Status: None   Collection Time: 11/18/15  4:01 PM  Result Value Ref Range   Troponin I <0.03 <0.03 ng/mL   ____________________________________________  EKG My review and personal interpretation at Time: 14:07   Indication: clamminess  Rate: 80  Rhythm: nsr Axis: normal Other: no acute st depressions or elevations.  Normal intervals. ____________________________________________  RADIOLOGY  CXR my read shows no evidence of acute cardiopulmonary process.  ____________________________________________   PROCEDURES  Procedure(s)  performed: none    Critical Care performed: no ____________________________________________   INITIAL IMPRESSION / ASSESSMENT AND PLAN / ED COURSE  Pertinent labs & imaging results that were available during my care of the patient were reviewed by me and considered in my medical decision making (see chart for details).  DDX: Hot flash, hypoglycemia, electrolyte abnormality, ACS, dysrhythmia or sepsis  Magaline F Delahoz is a 82 y.o. who presents to the ED with Complaint of clamminess and feeling flushed this a.m. Episode was very brief and resolved with drinking water and a cold rag. This is not consistent with ACS. EKG shows no acute ischemia and troponin is negative over 6 hours after the episode. She is otherwise asymptomatic at this time. No evidence of significant electrolyte abnormality. No evidence of infection. Chest x-ray will be ordered to evaluate for any edema.  Clinical Course  Comment By Time  No evidence of UTI. Chest x-ray without any significant edema. Electrolytes within normal range. Troponin is negative and EKG shows no evidence of acute ischemia. Merlyn Lot, MD 09/21 [phone]   Patient was able to tolerate PO and was able to ambulate with a steady gait.  Patient requesting discharge home.  Have discussed with the patient and available family all diagnostics and treatments performed thus far and all questions were answered to the best of my ability. The patient demonstrates understanding and agreement with plan.    ____________________________________________   FINAL CLINICAL IMPRESSION(S) / ED DIAGNOSES  Final diagnoses:  Hot flashes      NEW MEDICATIONS STARTED DURING THIS VISIT:  New Prescriptions   No medications on file     Note:  This document was prepared using Dragon voice recognition software and may include unintentional dictation errors.    Merlyn Lot, MD 11/18/15 2226

## 2015-11-18 NOTE — Progress Notes (Signed)
  Tommi Rumps, MD Phone: [phone]  Abigail Boyd is a 82 y.o. female who presents today for same-day visit.  Patient notes acute onset dyspnea this morning while she is laying down in bed. She became diaphoretic and hot and clammy. She is not exerting herself. She had no chest pain with this. She has some chronic cough related to congestion and postnasal drip. She's not had any fevers. No palpitations. No symptoms at this time. Symptoms lasted for 3 hours. Does note some baseline anxiety. Does have chronic mild exertional shortness of breath though this is different than that.  PMH: nonsmoker.   ROS see history of present illness  Objective  Physical Exam Vitals:   11/18/15 1312  BP: 124/60  Pulse: 84  Temp: 98.7 F (37.1 C)    BP Readings from Last 3 Encounters:  11/18/15 124/60  10/11/15 132/76  09/29/15 (!) 155/74   Wt Readings from Last 3 Encounters:  11/18/15 188 lb 12.8 oz (85.6 kg)  10/11/15 186 lb 9.6 oz (84.6 kg)  08/25/15 182 lb 4 oz (82.7 kg)    Physical Exam  Constitutional: No distress.  HENT:  Head: Normocephalic and atraumatic.  Cardiovascular: Normal rate and normal heart sounds.  An irregularly irregular rhythm present.  Pulmonary/Chest: Effort normal and breath sounds normal.  Musculoskeletal: She exhibits no edema.  Neurological: She is alert.  Skin: Skin is warm and dry. She is not diaphoretic.   EKG: Normal sinus rhythm, rate 81, nonspecific ST depressions  Assessment/Plan: Please see individual problem list.  Shortness of breath Patient with acute onset shortness of breath this morning associated with diaphoresis and clamminess. Concern is that this could be related to a cardiac cause particularly given her history of CAD and total occlusion RCA. No obvious abnormalities on lung exam. EKG with nonspecific ST depressions. Asymptomatic at this time. Given symptomatology, patient history, and nonspecific changes on EKG we'll recommend the  patient go to the emergency room for evaluation. Given that she is asymptomatic now I feel that she is safe to be transported by her family at this time. Advised that if she develops any symptoms in route to call 911. CMA will call the charge nurse to inform them she is on her way.   Orders Placed This Encounter  Procedures  . EKG 12-Lead   Tommi Rumps, MD [place]

## 2015-11-18 NOTE — Telephone Encounter (Signed)
Noted  

## 2015-11-24 DIAGNOSIS — E119 Type 2 diabetes mellitus without complications: Secondary | ICD-10-CM | POA: Diagnosis not present

## 2015-11-24 DIAGNOSIS — Z794 Long term (current) use of insulin: Secondary | ICD-10-CM | POA: Diagnosis not present

## 2015-12-02 DIAGNOSIS — E041 Nontoxic single thyroid nodule: Secondary | ICD-10-CM | POA: Diagnosis not present

## 2015-12-06 ENCOUNTER — Ambulatory Visit: Payer: Medicare Other | Admitting: Neurology

## 2015-12-13 ENCOUNTER — Ambulatory Visit (INDEPENDENT_AMBULATORY_CARE_PROVIDER_SITE_OTHER): Payer: Medicare Other | Admitting: Family Medicine

## 2015-12-13 ENCOUNTER — Encounter: Payer: Self-pay | Admitting: Family Medicine

## 2015-12-13 DIAGNOSIS — F419 Anxiety disorder, unspecified: Secondary | ICD-10-CM | POA: Insufficient documentation

## 2015-12-13 DIAGNOSIS — R0602 Shortness of breath: Secondary | ICD-10-CM

## 2015-12-13 DIAGNOSIS — J309 Allergic rhinitis, unspecified: Secondary | ICD-10-CM | POA: Diagnosis not present

## 2015-12-13 MED ORDER — PANTOPRAZOLE SODIUM 40 MG PO TBEC: 40.0000 mg | DELAYED_RELEASE_TABLET | Freq: Every day | ORAL | 0 refills | Status: DC

## 2015-12-13 MED ORDER — ALPRAZOLAM 0.25 MG PO TABS: 0.2500 mg | ORAL_TABLET | Freq: Every evening | ORAL | 0 refills | Status: DC | PRN

## 2015-12-13 NOTE — Assessment & Plan Note (Signed)
Patient with anxiety mostly at night. Currently on Paxil. Taking Xanax intermittently. No adverse effects from the Xanax. Discussed trying trazodone though on review of interactions is not a good option with the Paxil. We will try a low-dose of Xanax at night as needed for her anxiety. Warned against potential for side effects.

## 2015-12-13 NOTE — Assessment & Plan Note (Signed)
Appears to have chronic issues with this. For some reason she is currently on 2 second generation antihistamines. Advised that she could discontinue the Claritin. If her symptoms remain the same she may try coming off the [place] as well. If symptoms remain similar she doesn't need antihistamines. She'll continue the nose sprays. We will try Protonix at the urging of her allergist to see if this will be beneficial for her cough though I have a low suspicion for reflux given lack of reflux symptoms. She'll monitor her symptoms and if no improvement she'll follow-up.

## 2015-12-13 NOTE — Assessment & Plan Note (Signed)
Patient has chronic shortness of breath. Possibly slowly progressing. Could be related to deconditioning. Does have a cardiac history. Asymptomatic at this time. She has follow-up with her cardiologist in the next 1-2 weeks and I advised that she keep this. She's given return precautions.

## 2015-12-13 NOTE — Progress Notes (Signed)
Pre visit review using our clinic review tool, if applicable. No additional management support is needed unless otherwise documented below in the visit note. 

## 2015-12-13 NOTE — Patient Instructions (Signed)
Nice to see you. We will have you discontinue her Claritin. You can continue the Xyzal. If your allergy symptoms are the same you can discontinue the Xyzal as well and see if they remain similar. Please see h your er cardiologist as scheduled. If you develop chest pain, sweatiness, shortness of breath, or any new or changing symptoms please seek medical attention medially. We'll start you on Protonix. We'll continue Xanax as needed for anxiety at night. If this makes you drowsy please let us know.

## 2015-12-13 NOTE — Progress Notes (Signed)
Tommi Rumps, MD Phone: [phone]  Abigail Boyd is a 82 y.o. female who presents today for follow-up.  Anxiety/depression: Patient notes she mostly gets anxious at night. Has had some Xanax from a previous prescription from last year that she has taken to help with her anxiety. Notes this helps her calm down and get to sleep. It does not make her drowsy. She's been on Paxil for a number of years. No SI.  Patient notes she is seeing an allergist for allergies. She is on Claritin and Xyzal. Additionally takes 2 nose sprays. Notes persistent drainage and cough. States they advised that she try a different reflux medication. She notes no burning or sour taste. Notes she does burp some. No blood in her stool.  Shortness of breath: Patient notes that she has chronic shortness of breath that might be slightly worse than previously. Slowly declining per her daughter. No orthopnea though she does sleep in a recliner due to her arthritic issues. No PND. Notes that at times when walking around she can get fatigued and slightly short of breath. Previously had an EKG at her last office visit that had nonspecific findings. She has follow-up with her cardiologist in the next 1-2 weeks. No symptoms at this time.  PMH: nonsmoker.   ROS see history of present illness  Objective  Physical Exam Vitals:   12/13/15 1446  BP: 126/64  Pulse: 82  Temp: 98.2 F (36.8 C)    BP Readings from Last 3 Encounters:  12/13/15 126/64  11/18/15 (!) 182/83  11/18/15 124/60   Wt Readings from Last 3 Encounters:  12/13/15 187 lb (84.8 kg)  11/18/15 188 lb (85.3 kg)  11/18/15 188 lb 12.8 oz (85.6 kg)    Physical Exam  Constitutional: No distress.  HENT:  Head: Normocephalic and atraumatic.  Mouth/Throat: Oropharynx is clear and moist. No oropharyngeal exudate.  Normal TMs bilaterally  Eyes: Conjunctivae are normal. Pupils are equal, round, and reactive to light.  Cardiovascular: Normal rate, regular  rhythm and normal heart sounds.   Pulmonary/Chest: Effort normal and breath sounds normal.  Musculoskeletal: She exhibits no edema.  Neurological: She is alert.  Skin: Skin is warm and dry. She is not diaphoretic.     Assessment/Plan: Please see individual problem list.  Shortness of breath Patient has chronic shortness of breath. Possibly slowly progressing. Could be related to deconditioning. Does have a cardiac history. Asymptomatic at this time. She has follow-up with her cardiologist in the next 1-2 weeks and I advised that she keep this. She's given return precautions.  Anxiety Patient with anxiety mostly at night. Currently on Paxil. Taking Xanax intermittently. No adverse effects from the Xanax. Discussed trying trazodone though on review of interactions is not a good option with the Paxil. We will try a low-dose of Xanax at night as needed for her anxiety. Warned against potential for side effects.  Allergic rhinitis Appears to have chronic issues with this. For some reason she is currently on 2 second generation antihistamines. Advised that she could discontinue the Claritin. If her symptoms remain the same she may try coming off the [employer] as well. If symptoms remain similar she doesn't need antihistamines. She'll continue the nose sprays. We will try Protonix at the urging of her allergist to see if this will be beneficial for her cough though I have a low suspicion for reflux given lack of reflux symptoms. She'll monitor her symptoms and if no improvement she'll follow-up.   No orders of the  defined types were placed in this encounter.   Meds ordered this encounter  Medications  . ALPRAZolam (XANAX) 0.25 MG tablet    Sig: Take 1 tablet (0.25 mg total) by mouth at bedtime as needed for sleep or anxiety.    Dispense:  20 tablet    Refill:  0  . pantoprazole (PROTONIX) 40 MG tablet    Sig: Take 1 tablet (40 mg total) by mouth daily.    Dispense:  30 tablet    Refill:  0     Tommi Rumps, MD [place]

## 2015-12-20 ENCOUNTER — Other Ambulatory Visit: Payer: Self-pay | Admitting: Family Medicine

## 2015-12-23 ENCOUNTER — Encounter: Payer: Self-pay | Admitting: *Deleted

## 2015-12-27 ENCOUNTER — Encounter: Payer: Self-pay | Admitting: Cardiology

## 2015-12-27 ENCOUNTER — Ambulatory Visit (INDEPENDENT_AMBULATORY_CARE_PROVIDER_SITE_OTHER): Payer: Medicare Other | Admitting: Cardiology

## 2015-12-27 VITALS — BP 145/68 | HR 71 | Ht 63.0 in | Wt 188.6 lb

## 2015-12-27 DIAGNOSIS — I480 Paroxysmal atrial fibrillation: Secondary | ICD-10-CM

## 2015-12-27 DIAGNOSIS — I951 Orthostatic hypotension: Secondary | ICD-10-CM

## 2015-12-27 DIAGNOSIS — I4589 Other specified conduction disorders: Secondary | ICD-10-CM | POA: Diagnosis not present

## 2015-12-27 DIAGNOSIS — Z7901 Long term (current) use of anticoagulants: Secondary | ICD-10-CM

## 2015-12-27 DIAGNOSIS — Z87898 Personal history of other specified conditions: Secondary | ICD-10-CM

## 2015-12-27 DIAGNOSIS — I2510 Atherosclerotic heart disease of native coronary artery without angina pectoris: Secondary | ICD-10-CM

## 2015-12-27 DIAGNOSIS — I495 Sick sinus syndrome: Secondary | ICD-10-CM

## 2015-12-27 DIAGNOSIS — R600 Localized edema: Secondary | ICD-10-CM

## 2015-12-27 DIAGNOSIS — I10 Essential (primary) hypertension: Secondary | ICD-10-CM

## 2015-12-27 DIAGNOSIS — E785 Hyperlipidemia, unspecified: Secondary | ICD-10-CM

## 2015-12-27 DIAGNOSIS — Z9861 Coronary angioplasty status: Secondary | ICD-10-CM

## 2015-12-27 DIAGNOSIS — R55 Syncope and collapse: Secondary | ICD-10-CM

## 2015-12-27 NOTE — Patient Instructions (Signed)
Medication Instructions:  TAKE SECOND DOSE OF METOPROLOL 25 MG A COUPLE HOURS EARLIER THAN CURRENTLY TAKING. (10 HOURS APART)   IF HAVING PALPITATIONS AND HAVE ALREADY TAKEN MORNING DOSE AT LEAST  6-7 HOURS PRIOR YOU MAY TAKE 2ND DAILY DOSE  Labwork: NONE  Follow-Up: Your physician recommends that you schedule a follow-up appointment in: 6 MONTHS WITH DR [facility]  Any Other Special Instructions Will Be Listed Below (If Applicable). OK TO DO PHYSICAL THERAPY FOR KNEE TO REMAIN ACTIVE    If you need a refill on your cardiac medications before your next appointment, please call your pharmacy.

## 2015-12-27 NOTE — Progress Notes (Signed)
PCP: Tommi Rumps, MD  Clinic Note: Chief Complaint  Patient presents with  . Follow-up    pt denied chest pain   . Atrial Fibrillation    HPI: Abigail Boyd is a 82 y.o. female with a PMH below who presents today for three-month follow-up of CAD-PCI, Afib (with bradycardia issues) & recent episodes of syncope.Abigail Boyd was last seen in June 2017  Recent Hospitalizations: [facility] 11/18/2015 with clamminess and feeling flushed after breakfast Studies Reviewed: No new studies   Interval History: Abigail Boyd presents today really pretty much stable overall. The most notable at issue is that she is in a wheelchair, because she is very limited in mobility because of significant pain in her right knee. She had a meniscal tear and then knee, but has continued to have symptoms despite having injections. She asked about possibly doing physical therapy, but when she asked her PCP, she was told to ask cardiologist. As a result of being limited by her knee pain, she really does not do much the way of any exertional activity. She therefore is deconditioned and may get exertional dyspnea if she does much, but denies any chest tightness or pressure associated with it. When she does walk, she uses a rolling walker - but cannot go very far..   She has occasional (maybe once or twice a month episodes where she feels that she may be in atrial fibrillation (her daughter will note her heart rate sometimes going as high as 130 bpm area she herself does not feel the atrial fibrillation per se as far as rapid irregular heartbeats. What she feels is just not feeling well, fatigue tired or short of breath. Thankfully these episodes do not last more than about an hour and resolved spontaneously. He has not had any syncope or near syncope or TIA/amaurosis fugax. She says the most the time these occur in the afternoon about 3-4 hours from the time she is due for her next dose of beta blocker.   No PND or orthopnea, but  she sleeps in a recliner since because of her back pain.  No melena, hematochezia, hematuria, or epstaxis. No claudication.  ROS: A comprehensive was performed. Review of Systems  Constitutional: Positive for fever (No episodes since her ER visit in late September.Marland Kitchen). Negative for malaise/fatigue and weight loss.  HENT: Positive for congestion. Negative for nosebleeds.   Eyes: Negative for blurred vision.  Respiratory: Negative for cough, shortness of breath and wheezing.   Cardiovascular: Positive for leg swelling (Off-and-on. Has really only been taking 20 mg as needed of Lasix.).       Per history of present illness  Gastrointestinal: Negative for blood in stool, heartburn and melena.  Genitourinary: Positive for frequency. Negative for dysuria and hematuria.  Musculoskeletal: Positive for back pain and joint pain (Bilateral knees and hip pain - right knee is the worst).  Neurological: Positive for dizziness (Because she is not very mobile, she doesn't have good balance). Negative for headaches.       Unsteady gait, mostly because of arthritic injuries/pain  Endo/Heme/Allergies: Does not bruise/bleed easily.  Psychiatric/Behavioral: Positive for memory loss (Needs her daughter with her now. She is now moved in with her daughter.). Negative for depression. The patient is not nervous/anxious and does not have insomnia.   All other systems reviewed and are negative.   Past Medical History:  Diagnosis Date  . Abnormal nuclear stress test 06/21/2011   Inferolateral reversible defect;--> cardiac cath & PCI  of CxOM, occluded RCA with collaterals;; followup Myoview 11/2012: Low risk. Fixed basal inferior artifact normal EF. No ischemia  . Anxiety    When necessary Xanax  . Bilateral cataracts    Status post stroke or correction  . CAD S/P percutaneous coronary angioplasty 06/2011   s/P PCI to proximal OM1 w/ DES; occluded RCA with bridging and L-R collaterals (medical management)  .  Chronic kidney disease (CKD), stage II (mild)    Related to current bladder infections (although diabetes cannot be excluded)  . Chronotropic incompetence with sinus node dysfunction [facility]) October 2013   On CPET test; beta blockers reduced  . Diabetes mellitus type 2, controlled ([place])    On oral medications  . Diverticulitis   . Essential hypertension    Allowing for permissive hypertension to avoid orthostatic hypotension  . GERD (gastroesophageal reflux disease)    On PPI  . Glaucoma   . History of syncope    Per EP - neurocardiogenic & not Bradycardia related (no PPM)  . History of unstable angina 06/13/2011   Jaw pain awakening from sleep -- Myoview --CATH --> PCI  . Hyperlipidemia with target LDL less than 70    HDL at goal, LDL not at goal, borderline triglycerides. On Crestor 20 mg  . Migraines   . OSA (obstructive sleep apnea)    hx bladder infections  . Osteoarthritis   . PAF (paroxysmal atrial fibrillation) ([place]) 10-11/ 2014   CardioNet Event Monitor: NSR & S Brady -- Rates 50-100; Total A. fib burden 35 hours and 27 minutes. 1296 episodes, longest was 1 hour 29 minutes. Rate ranged from 52-169 beats per minute.  . Seasonal allergies   . Shortness of breath on exertion October 2013   2-D echo: Normal EF>55%, Gr 1DD, mild aortic sclerosis; Evaluated with CPET - peak VO2 97%; Chronotropic Incompetence (submaximal effort)  . Spinal stenosis of lumbar region 11/2011  . Stroke ([place])   . Tachycardia-bradycardia syndrome ([place]) 11/2012  . Urge incontinence     Past Surgical History:  Procedure Laterality Date  . APPENDECTOMY    . BREAST BIOPSY     both breast  . cataracts     both eyes  . COLONOSCOPY    . CORONARY ANGIOPLASTY WITH STENT PLACEMENT  07/05/2011   Promus Element DES 2.5 mm x 16 mm-post dilated to 2.65 mm CX-Proximal OM1  . Holter Monitor  04/2015   Sinus rhythm with rates 52-149 BPM. Isolated PACs with rare couplets and bigeminy. Multiple short runs of  PAT/PSVT. Arrhythmia run 120 bpm for 204 beats. 14% of time was in A. fib/flutter. - Reviewed by Dr. Caryl Comes. Not thought to be significant enough for her syncope.  Marland Kitchen KNEE ARTHROSCOPY WITH MEDIAL MENISECTOMY Right 06/24/2014   Procedure: RIGHT KNEE ARTHROSCOPY WITH partial lateral MENISECTOMY, abrasion chondroplasty of medial femoral condyle and patella, microfracture technique;  Surgeon: Latanya Maudlin, MD;  Location: [facility];  Service: Orthopedics;  Laterality: Right;  . LEFT HEART CATHETERIZATION WITH CORONARY ANGIOGRAM N/A 07/06/2011   Procedure: LEFT HEART CATHETERIZATION WITH CORONARY ANGIOGRAM;  Surgeon: Leonie Man, MD;  Location: [facility] CATH LAB: Unstable Angina --> Myoview wiht Inf-Lat Ischemia.  Proximal OM1 lesion --> PCI; 100% mid RCA with right to right and left to right bridging collaterals from circumflex RPL and LAD the PDA.  Marland Kitchen NM MYOVIEW LTD  October 2014    Low risk. Fixed basal inferior artifact normal EF. No ischemia --> (as compared to pre-PCI Myoview revealing inferolateral ischemia)  .  TRANSTHORACIC ECHOCARDIOGRAM  11/2014   EF 65-70%, Mod LVH. Normal wall motion. Gr 1 DD. Normal valves.  Marland Kitchen VAGINAL HYSTERECTOMY      Prior to Admission medications   Medication Sig Start Date Taking? Authorizing Provider  acetaminophen (TYLENOL) 650 MG CR tablet Take 1,300 mg by mouth 2 (two) times daily as needed for pain.  Yes Historical Provider, MD  ALPRAZolam (XANAX) 0.25 MG tablet Take 1 tablet (0.25 mg total) by mouth at bedtime as needed for sleep or anxiety. 12/13/15 Yes Leone Haven, MD  cholecalciferol (VITAMIN D) 1000 UNITS tablet Take 1,000 Units by mouth daily.  Yes Historical Provider, MD  docusate sodium (STOOL SOFTENER) 100 MG capsule Take 100 mg by mouth daily.   Yes Historical Provider, MD  fexofenadine (ALLEGRA) 180 MG tablet Take 180 mg by mouth at bedtime.   Yes Historical Provider, MD  fish oil-omega-3 fatty acids 1000 MG capsule Take 1 g by mouth daily.   Yes Historical  Provider, MD  furosemide (LASIX) 20 MG tablet TAKE 1 TABLET BY MOUTH EVERY DAY AS NEEDED THEN 2 TABLETS IF SWELLING Patient taking differently: Take 40 mg by mouth daily. TAKE 1 TABLET BY MOUTH EVERY DAY AS NEEDED THEN 2 TABLETS IF SWELLING 07/05/15 Yes Leonie Man, MD  Insulin Glargine (LANTUS) 100 UNIT/ML Solostar Pen Inject 70 Units into the skin.  03/31/15 Yes Historical Provider, MD  insulin lispro (HUMALOG KWIKPEN) 100 UNIT/ML KiwkPen Inject 22 Units into the skin 3 (three) times daily as needed (CBG >200).   Yes Historical Provider, MD  ipratropium (ATROVENT) 0.06 % nasal spray Place 1 spray into the nose daily.  12/08/10 Yes Historical Provider, MD  latanoprost (XALATAN) 0.005 % ophthalmic solution Instill one drop into each eye every night at bedtime  Yes Historical Provider, MD  levocetirizine (XYZAL) 5 MG tablet Take 5 mg by mouth every evening.  03/22/15 Yes Historical Provider, MD  loratadine (CLARITIN) 10 MG tablet Take 10 mg by mouth daily.  Yes Historical Provider, MD  LUMIGAN 0.01 % SOLN Place 1 drop into both eyes at bedtime.  03/06/15 Yes Historical Provider, MD  Magnesium 500 MG CAPS Take 1,000 mg by mouth daily as needed (constipation).   Yes Historical Provider, MD  metoprolol tartrate (LOPRESSOR) 25 MG tablet Take 1 tablet (25 mg total) by mouth 2 (two) times daily. HOLD for SBP <110 and HR < 60 05/26/15 Yes Leonie Man, MD  mirabegron ER (MYRBETRIQ) 50 MG TB24 tablet Take one tablet by mouth once daily for kidneys  Yes Historical Provider, MD  nitroGLYCERIN (NITROSTAT) 0.4 MG SL tablet Place 0.4 mg under the tongue every 5 (five) minutes as needed for chest pain.  Yes Historical Provider, MD  nystatin cream (MYCOSTATIN) Apply 1 application topically daily. 12/03/14 Yes Historical Provider, MD  ONE TOUCH ULTRA TEST test strip 1 each by Other route as needed.  03/18/15 Yes Historical Provider, MD  pantoprazole (PROTONIX) 40 MG tablet TAKE 1 TABLET BY MOUTH  DAILY 12/20/15 Yes Leone Haven, MD  PARoxetine (PAXIL) 10 MG tablet Take 1 tablet (10 mg total) by mouth daily. 11/15/15 Yes Leone Haven, MD  Rivaroxaban (XARELTO) 15 MG TABS tablet Take 1 tablet (15 mg total) by mouth daily with supper. 07/05/15 Yes Leonie Man, MD  rOPINIRole (REQUIP) 0.5 MG tablet Take 1 tablet by mouth at  bedtime 11/08/15 Yes Leone Haven, MD  rosuvastatin (CRESTOR) 40 MG tablet Take 1 tablet (40 mg total) by mouth  daily. 05/26/15 Yes Leonie Man, MD  sitaGLIPtin (JANUVIA) 50 MG tablet Take 50 mg by mouth at bedtime.   Yes Historical Provider, MD  VESICARE 5 MG tablet TAKE 1 TABLET (5 MG TOTAL) BY MOUTH ONCE DAILY. 03/30/15 Yes Historical Provider, MD     Allergies  Allergen Reactions  . Lisinopril Cough  . Losartan Cough  . Sulfa Antibiotics Hives    Social History   Social History  . Marital status: Widowed    Spouse name: N/A  . Number of children: N/A  . Years of education: N/A   Occupational History  . retired    Social History Main Topics  . Smoking status: Never Smoker  . Smokeless tobacco: Never Used  . Alcohol use No  . Drug use: No  . Sexual activity: No   Other Topics Concern  . None   Social History Narrative   Widowed mother of one, grandmother 2. Has not been using silver take as much lately due to knee discomfort. Usually exercises with Silver Sneakers on regular basis.   Family History  Problem Relation Age of Onset  . COPD Mother   . Pulmonary fibrosis Mother   . Heart attack Father   . Heart disease Father   . Stroke Father   . Cancer Sister   . Cancer Sister   . Clotting disorder Sister   . Heart attack Sister   . Arthritis    . Breast cancer Sister   . Colon cancer Neg Hx   . Esophageal cancer Neg Hx   . Stomach cancer Neg Hx      Wt Readings from Last 3 Encounters:  12/27/15 85.5 kg (188 lb 9.6 oz)  12/13/15 84.8 kg (187 lb)  11/18/15 85.3 kg (188 lb)    PHYSICAL EXAM BP (!) 145/68   Pulse 71   Ht 5\' 3"  (1.6 m)    Wt 85.5 kg (188 lb 9.6 oz)   BMI 33.41 kg/m  General appearance: alert, cooperative, appears stated age, no distress and Mildly obese; sitting in a wheelchair. Neck: no adenopathy, no carotid bruit and no JVD Lungs: clear to auscultation bilaterally, normal percussion bilaterally and non-labored Heart: regular rate and rhythm, S1, S2 normal, no murmur, click, rub or gallop; nondisplaced PMI Abdomen: soft, non-tender; bowel sounds normal; no masses, no organomegaly; no HSM Extremities: extremities normal, atraumatic, no cyanosis, and edema trace Pulses: 2+ and symmetric;  Skin: mobility and turgor normal  Neurologic: Mental status: Alert, oriented, thought content appropriate Cranial nerves: normal (II-XII grossly intact)   Adult ECG Report - Not checked  Other studies Reviewed: Additional studies/ records that were reviewed today include:  Recent Labs:   Lab Results  Component Value Date   CHOL 212 (H) 05/18/2015   HDL 54.20 05/18/2015   LDLCALC 98 12/27/2014   LDLDIRECT 106.0 05/18/2015   TRIG 225.0 (H) 05/18/2015   CHOLHDL 4 05/18/2015    ASSESSMENT / PLAN: Problem List Items Addressed This Visit    Tachy-brady syndrome: Rates range from 50-169 bpm in A. fib (Chronic)    She still has a tachycardia spells that her daughter notes. Her only symptoms with this is feeling poorly. She has not had sensation of rapid heartbeats however. Will we have not seen is any documented bradycardia symptoms. No recurrent syncope. I would be very reluctant to increase her beta blocker any. At the same time would also be reluctant to use antiarrhythmic agents.  So far, she has been felt to be  not a candidate for pacemaker. We will continue to monitor.      PAF (paroxysmal atrial fibrillation) (HCC);  CHA2DS2-VASc Score =6 on 15 mg Xarelto (Chronic)    She seems to still have intermittent paroxysmal A. fib. These tend to correlate with nearing the end of the day when her next dose of  metoprolol is due.  Given the concern of bradycardia, I don't want to increase her beta blocker any. Plan for now will be simply to move her evening dose of beta blocker up to 9-10 hours after the morning dose. If she does have a recurrent episode, the recommendation be to go ahead and take her evening dose earlier. She can still take another dose when necessary if it happens to be in middle of the day.  No bleeding issues with Xarelto. No further syncope to suggest bradycardia new syncope.      Orthostatic hypotension (Chronic)    Ensure adequate hydration. Mild permissive hypertension.      Neurocardiogenic syncope (Chronic)    Actually, no recurrent symptoms. At this point allowing for mild permissive hypertension is the goal. Also avoiding rapid changes in positions. Using a walker to walk his best. Also ensuring adequate hydration.      Long term current use of anticoagulant therapy (Chronic)    Continues to be stable on Xarelto at 15 mg daily. No bleeding issues.      Hyperlipidemia with target LDL less than 70 (Chronic)    Continue to monitor on increased dose of Crestor. She should be due to have labs checked again in March. Was not quite at goal last check, but should be okay following up those labs after one year.      Essential hypertension (Chronic)    This is pretty well-controlled for her. I would not want to be more aggressive. She is on low-dose beta blocker, but no other antihypertensive agents.      CAD-PCI OM1, 100% RCA (L-R colllaterals) (Chronic)    No anginal symptoms which in her case was jaw pain. Remains on low-dose beta blocker as well as statin. She is not on aspirin or Plavix secondary to being on Xarelto.      Bilateral lower extremity edema (Chronic)    Mild edema with no heart failure symptoms associated with it. She is on basically when necessary Lasix which she takes most days. She has not been using an additional dose when necessary as we had  recommended in the past. I confirmed that she should be okay on every now noninvasive take an additional dose based on weight change 13 pounds or symptoms of dyspnea or edema.. Also think that support stockings would be helpful.       Other Visit Diagnoses   None.     Current medicines are reviewed at length with the patient today. (+/- concerns) ? How to take Lasix The following changes have been made:  Medication Instructions:  Your physician has recommended you make the following change in your medication:  CONTINUE taking lasix 20mg  daily. You may take 40mg  daily as needed for increased swelling   Studies Ordered:   No orders of the defined types were placed in this encounter.     Glenetta Hew, M.D., M.S. Interventional Cardiologist   Pager # [phone] Phone # [phone] [address]. [place], [place] [address]

## 2015-12-28 ENCOUNTER — Encounter: Payer: Self-pay | Admitting: Cardiology

## 2015-12-28 NOTE — Assessment & Plan Note (Signed)
Actually, no recurrent symptoms. At this point allowing for mild permissive hypertension is the goal. Also avoiding rapid changes in positions. Using a walker to walk his best. Also ensuring adequate hydration.

## 2015-12-28 NOTE — Assessment & Plan Note (Signed)
No anginal symptoms which in her case was jaw pain. Remains on low-dose beta blocker as well as statin. She is not on aspirin or Plavix secondary to being on Xarelto.

## 2015-12-28 NOTE — Assessment & Plan Note (Signed)
She seems to still have intermittent paroxysmal A. fib. These tend to correlate with nearing the end of the day when her next dose of metoprolol is due.  Given the concern of bradycardia, I don't want to increase her beta blocker any. Plan for now will be simply to move her evening dose of beta blocker up to 9-10 hours after the morning dose. If she does have a recurrent episode, the recommendation be to go ahead and take her evening dose earlier. She can still take another dose when necessary if it happens to be in middle of the day.  No bleeding issues with Xarelto. No further syncope to suggest bradycardia new syncope.

## 2015-12-28 NOTE — Assessment & Plan Note (Addendum)
Mild edema with no heart failure symptoms associated with it. She is on basically when necessary Lasix which she takes most days. She has not been using an additional dose when necessary as we had recommended in the past. I confirmed that she should be okay on every now noninvasive take an additional dose based on weight change 13 pounds or symptoms of dyspnea or edema.. Also think that support stockings would be helpful.

## 2015-12-28 NOTE — Assessment & Plan Note (Addendum)
Continues to be stable on Xarelto at 15 mg daily. No bleeding issues.

## 2015-12-28 NOTE — Assessment & Plan Note (Signed)
This is pretty well-controlled for her. I would not want to be more aggressive. She is on low-dose beta blocker, but no other antihypertensive agents.

## 2015-12-28 NOTE — Assessment & Plan Note (Signed)
Continue to monitor on increased dose of Crestor. She should be due to have labs checked again in March. Was not quite at goal last check, but should be okay following up those labs after one year.

## 2015-12-28 NOTE — Assessment & Plan Note (Signed)
Ensure adequate hydration. Mild permissive hypertension.

## 2015-12-28 NOTE — Assessment & Plan Note (Signed)
She still has a tachycardia spells that her daughter notes. Her only symptoms with this is feeling poorly. She has not had sensation of rapid heartbeats however. Will we have not seen is any documented bradycardia symptoms. No recurrent syncope. I would be very reluctant to increase her beta blocker any. At the same time would also be reluctant to use antiarrhythmic agents.  So far, she has been felt to be not a candidate for pacemaker. We will continue to monitor.

## 2016-01-05 DIAGNOSIS — R3129 Other microscopic hematuria: Secondary | ICD-10-CM | POA: Diagnosis not present

## 2016-01-09 ENCOUNTER — Other Ambulatory Visit: Payer: Self-pay | Admitting: Family Medicine

## 2016-01-12 ENCOUNTER — Other Ambulatory Visit: Payer: Self-pay | Admitting: Cardiology

## 2016-01-29 DIAGNOSIS — N3000 Acute cystitis without hematuria: Secondary | ICD-10-CM | POA: Diagnosis not present

## 2016-01-31 DIAGNOSIS — N3000 Acute cystitis without hematuria: Secondary | ICD-10-CM | POA: Diagnosis not present

## 2016-01-31 DIAGNOSIS — M1711 Unilateral primary osteoarthritis, right knee: Secondary | ICD-10-CM | POA: Diagnosis not present

## 2016-01-31 DIAGNOSIS — M5441 Lumbago with sciatica, right side: Secondary | ICD-10-CM | POA: Diagnosis not present

## 2016-01-31 DIAGNOSIS — M5136 Other intervertebral disc degeneration, lumbar region: Secondary | ICD-10-CM | POA: Diagnosis not present

## 2016-02-09 ENCOUNTER — Other Ambulatory Visit: Payer: Self-pay | Admitting: Family Medicine

## 2016-02-10 NOTE — Telephone Encounter (Signed)
Refill given. Please fax. 

## 2016-02-10 NOTE — Telephone Encounter (Signed)
Please advise on refill.

## 2016-02-11 NOTE — Telephone Encounter (Signed)
faxed

## 2016-02-16 DIAGNOSIS — Z961 Presence of intraocular lens: Secondary | ICD-10-CM | POA: Diagnosis not present

## 2016-02-23 ENCOUNTER — Ambulatory Visit (INDEPENDENT_AMBULATORY_CARE_PROVIDER_SITE_OTHER): Payer: Medicare Other

## 2016-02-23 VITALS — BP 128/68 | HR 71 | Temp 98.1°F | Resp 16 | Ht 63.0 in | Wt 190.1 lb

## 2016-02-23 DIAGNOSIS — Z0000 Encounter for general adult medical examination without abnormal findings: Secondary | ICD-10-CM

## 2016-02-23 NOTE — Patient Instructions (Addendum)
Abigail Boyd , Thank you for taking time to come for your Medicare Wellness Visit. I appreciate your ongoing commitment to your health goals. Please review the following plan we discussed and let me know if I can assist you in the future.   Follow up with Dr. Caryl Bis as needed.  Happy Holidays!!  These are the goals we discussed: Goals    . Healthy Lifestyle          Stay hydrated and drink plenty of fluids/water Stay active and continue to do chair exercises.   Low carb foods.  Lean meats and vegetables       This is a list of the screening recommended for you and due dates:  Health Maintenance  Topic Date Due  . Complete foot exam   10/08/1939  . Eye exam for diabetics  10/08/1939  . Urine Protein Check  [birth year]1941  . Tetanus Vaccine  10/09/1950  . Hemoglobin A1C  11/18/2015  . Flu Shot  Completed  . DEXA scan (bone density measurement)  Completed  . Shingles Vaccine  Completed  . Pneumonia vaccines  Completed      Fall Prevention in the Home Introduction Falls can cause injuries. They can happen to people of all ages. There are many things you can do to make your home safe and to help prevent falls. What can I do on the outside of my home?  Regularly fix the edges of walkways and driveways and fix any cracks.  Remove anything that might make you trip as you walk through a door, such as a raised step or threshold.  Trim any bushes or trees on the path to your home.  Use bright outdoor lighting.  Clear any walking paths of anything that might make someone trip, such as rocks or tools.  Regularly check to see if handrails are loose or broken. Make sure that both sides of any steps have handrails.  Any raised decks and porches should have guardrails on the edges.  Have any leaves, snow, or ice cleared regularly.  Use sand or salt on walking paths during winter.  Clean up any spills in your garage right away. This includes oil or grease spills. What can I do  in the bathroom?  Use night lights.  Install grab bars by the toilet and in the tub and shower. Do not use towel bars as grab bars.  Use non-skid mats or decals in the tub or shower.  If you need to sit down in the shower, use a plastic, non-slip stool.  Keep the floor dry. Clean up any water that spills on the floor as soon as it happens.  Remove soap buildup in the tub or shower regularly.  Attach bath mats securely with double-sided non-slip rug tape.  Do not have throw rugs and other things on the floor that can make you trip. What can I do in the bedroom?  Use night lights.  Make sure that you have a light by your bed that is easy to reach.  Do not use any sheets or blankets that are too big for your bed. They should not hang down onto the floor.  Have a firm chair that has side arms. You can use this for support while you get dressed.  Do not have throw rugs and other things on the floor that can make you trip. What can I do in the kitchen?  Clean up any spills right away.  Avoid walking on wet floors.  Keep  items that you use a lot in easy-to-reach places.  If you need to reach something above you, use a strong step stool that has a grab bar.  Keep electrical cords out of the way.  Do not use floor polish or wax that makes floors slippery. If you must use wax, use non-skid floor wax.  Do not have throw rugs and other things on the floor that can make you trip. What can I do with my stairs?  Do not leave any items on the stairs.  Make sure that there are handrails on both sides of the stairs and use them. Fix handrails that are broken or loose. Make sure that handrails are as long as the stairways.  Check any carpeting to make sure that it is firmly attached to the stairs. Fix any carpet that is loose or worn.  Avoid having throw rugs at the top or bottom of the stairs. If you do have throw rugs, attach them to the floor with carpet tape.  Make sure that you  have a light switch at the top of the stairs and the bottom of the stairs. If you do not have them, ask someone to add them for you. What else can I do to help prevent falls?  Wear shoes that:  Do not have high heels.  Have rubber bottoms.  Are comfortable and fit you well.  Are closed at the toe. Do not wear sandals.  If you use a stepladder:  Make sure that it is fully opened. Do not climb a closed stepladder.  Make sure that both sides of the stepladder are locked into place.  Ask someone to hold it for you, if possible.  Clearly mark and make sure that you can see:  Any grab bars or handrails.  First and last steps.  Where the edge of each step is.  Use tools that help you move around (mobility aids) if they are needed. These include:  Canes.  Walkers.  Scooters.  Crutches.  Turn on the lights when you go into a dark area. Replace any light bulbs as soon as they burn out.  Set up your furniture so you have a clear path. Avoid moving your furniture around.  If any of your floors are uneven, fix them.  If there are any pets around you, be aware of where they are.  Review your medicines with your doctor. Some medicines can make you feel dizzy. This can increase your chance of falling. Ask your doctor what other things that you can do to help prevent falls. This information is not intended to replace advice given to you by your health care provider. Make sure you discuss any questions you have with your health care provider. Document Released: 12/10/2008 Document Revised: 07/22/2015 Document Reviewed: 03/20/2014  2017 Elsevier

## 2016-02-23 NOTE — Progress Notes (Signed)
Subjective:   Abigail Boyd is a 82 y.o. female who presents for an Initial Medicare Annual Wellness Visit.  Review of Systems    No ROS.  Medicare Wellness Visit.  Cardiac Risk Factors include: advanced age (>47men, >29 women);diabetes mellitus;hypertension;obesity (BMI >30kg/m2)     Objective:    Today's Vitals   02/23/16 1611  BP: 128/68  Pulse: 71  Resp: 16  Temp: 98.1 F (36.7 C)  TempSrc: Oral  SpO2: 95%  Weight: 190 lb 1.9 oz (86.2 kg)  Height: 5\' 3"  (1.6 m)   Body mass index is 33.68 kg/m.   Current Medications (verified) Outpatient Encounter Prescriptions as of 02/23/2016  Medication Sig  . acetaminophen (TYLENOL) 650 MG CR tablet Take 1,300 mg by mouth 2 (two) times daily as needed for pain.  Marland Kitchen ALPRAZolam (XANAX) 0.25 MG tablet TAKE 1 TABLET BY MOUTH AT BEDTIME AS NEEDED SLEEP  . cholecalciferol (VITAMIN D) 1000 UNITS tablet Take 1,000 Units by mouth daily.  Marland Kitchen docusate sodium (STOOL SOFTENER) 100 MG capsule Take 100 mg by mouth daily.   . fexofenadine (ALLEGRA) 180 MG tablet Take 180 mg by mouth at bedtime.   . fish oil-omega-3 fatty acids 1000 MG capsule Take 1 g by mouth daily.   . furosemide (LASIX) 20 MG tablet TAKE 1 TABLET BY MOUTH EVERY DAY AS NEEDED THEN 2 TABLETS IF SWELLING (Patient taking differently: Take 40 mg by mouth daily. TAKE 1 TABLET BY MOUTH EVERY DAY AS NEEDED THEN 2 TABLETS IF SWELLING)  . furosemide (LASIX) 20 MG tablet TAKE 1 TABLET BY MOUTH EVERY DAY AS NEEDED THEN 2 TABLETS IF SWELLING  . Insulin Glargine (LANTUS) 100 UNIT/ML Solostar Pen Inject 70 Units into the skin.   Marland Kitchen insulin lispro (HUMALOG KWIKPEN) 100 UNIT/ML KiwkPen Inject 22 Units into the skin 3 (three) times daily as needed (CBG >200).   Marland Kitchen ipratropium (ATROVENT) 0.06 % nasal spray Place 1 spray into the nose daily.   Marland Kitchen latanoprost (XALATAN) 0.005 % ophthalmic solution Instill one drop into each eye every night at bedtime  . levocetirizine (XYZAL) 5 MG tablet Take 5 mg by  mouth every evening.   . loratadine (CLARITIN) 10 MG tablet Take 10 mg by mouth daily.  Marland Kitchen LUMIGAN 0.01 % SOLN Place 1 drop into both eyes at bedtime.   . Magnesium 500 MG CAPS Take 1,000 mg by mouth daily as needed (constipation).   . metoprolol tartrate (LOPRESSOR) 25 MG tablet Take 1 tablet (25 mg total) by mouth 2 (two) times daily. HOLD for SBP <110 and HR < 60  . mirabegron ER (MYRBETRIQ) 50 MG TB24 tablet Take one tablet by mouth once daily for kidneys  . nitroGLYCERIN (NITROSTAT) 0.4 MG SL tablet Place 0.4 mg under the tongue every 5 (five) minutes as needed for chest pain.  Marland Kitchen nystatin cream (MYCOSTATIN) Apply 1 application topically daily.  . ONE TOUCH ULTRA TEST test strip 1 each by Other route as needed.   . pantoprazole (PROTONIX) 40 MG tablet TAKE 1 TABLET BY MOUTH  DAILY  . PARoxetine (PAXIL) 10 MG tablet Take 1 tablet (10 mg total) by mouth daily.  . Rivaroxaban (XARELTO) 15 MG TABS tablet Take 1 tablet (15 mg total) by mouth daily with supper.  Marland Kitchen rOPINIRole (REQUIP) 0.5 MG tablet TAKE 1 TABLET BY MOUTH AT  BEDTIME  . rosuvastatin (CRESTOR) 40 MG tablet Take 1 tablet (40 mg total) by mouth daily.  . sitaGLIPtin (JANUVIA) 50 MG tablet Take 50  mg by mouth at bedtime.   . VESICARE 5 MG tablet TAKE 1 TABLET (5 MG TOTAL) BY MOUTH ONCE DAILY.   No facility-administered encounter medications on file as of 02/23/2016.     Allergies (verified) Lisinopril; Losartan; and Sulfa antibiotics   History: Past Medical History:  Diagnosis Date  . Abnormal nuclear stress test 06/21/2011   Inferolateral reversible defect;--> cardiac cath & PCI of CxOM, occluded RCA with collaterals;; followup Myoview 11/2012: Low risk. Fixed basal inferior artifact normal EF. No ischemia  . Anxiety    When necessary Xanax  . Bilateral cataracts    Status post stroke or correction  . CAD S/P percutaneous coronary angioplasty 06/2011   s/P PCI to proximal OM1 w/ DES; occluded RCA with bridging and L-R  collaterals (medical management)  . Chronic kidney disease (CKD), stage II (mild)    Related to current bladder infections (although diabetes cannot be excluded)  . Chronotropic incompetence with sinus node dysfunction [facility]) October 2013   On CPET test; beta blockers reduced  . Diabetes mellitus type 2, controlled ([place])    On oral medications  . Diverticulitis   . Essential hypertension    Allowing for permissive hypertension to avoid orthostatic hypotension  . GERD (gastroesophageal reflux disease)    On PPI  . Glaucoma   . History of syncope    Per EP - neurocardiogenic & not Bradycardia related (no PPM)  . History of unstable angina 06/13/2011   Jaw pain awakening from sleep -- Myoview --CATH --> PCI  . Hyperlipidemia with target LDL less than 70    HDL at goal, LDL not at goal, borderline triglycerides. On Crestor 20 mg  . Migraines   . OSA (obstructive sleep apnea)    hx bladder infections  . Osteoarthritis   . PAF (paroxysmal atrial fibrillation) ([place]) 10-11/ 2014   CardioNet Event Monitor: NSR & S Brady -- Rates 50-100; Total A. fib burden 35 hours and 27 minutes. 1296 episodes, longest was 1 hour 29 minutes. Rate ranged from 52-169 beats per minute.  . Seasonal allergies   . Shortness of breath on exertion October 2013   2-D echo: Normal EF>55%, Gr 1DD, mild aortic sclerosis; Evaluated with CPET - peak VO2 97%; Chronotropic Incompetence (submaximal effort)  . Spinal stenosis of lumbar region 11/2011  . Stroke ([place])   . Tachycardia-bradycardia syndrome ([place]) 11/2012  . Urge incontinence    Past Surgical History:  Procedure Laterality Date  . APPENDECTOMY    . BREAST BIOPSY     both breast  . cataracts     both eyes  . COLONOSCOPY    . CORONARY ANGIOPLASTY WITH STENT PLACEMENT  07/05/2011   Promus Element DES 2.5 mm x 16 mm-post dilated to 2.65 mm CX-Proximal OM1  . Holter Monitor  04/2015   Sinus rhythm with rates 52-149 BPM. Isolated PACs with rare couplets and  bigeminy. Multiple short runs of PAT/PSVT. Arrhythmia run 120 bpm for 204 beats. 14% of time was in A. fib/flutter. - Reviewed by Dr. Caryl Comes. Not thought to be significant enough for her syncope.  Marland Kitchen KNEE ARTHROSCOPY WITH MEDIAL MENISECTOMY Right 06/24/2014   Procedure: RIGHT KNEE ARTHROSCOPY WITH partial lateral MENISECTOMY, abrasion chondroplasty of medial femoral condyle and patella, microfracture technique;  Surgeon: Latanya Maudlin, MD;  Location: [facility];  Service: Orthopedics;  Laterality: Right;  . LEFT HEART CATHETERIZATION WITH CORONARY ANGIOGRAM N/A 07/06/2011   Procedure: LEFT HEART CATHETERIZATION WITH CORONARY ANGIOGRAM;  Surgeon: Leonie Man, MD;  Location: [place] CATH LAB: Unstable Angina --> Myoview wiht Inf-Lat Ischemia.  Proximal OM1 lesion --> PCI; 100% mid RCA with right to right and left to right bridging collaterals from circumflex RPL and LAD the PDA.  Marland Kitchen NM MYOVIEW LTD  October 2014    Low risk. Fixed basal inferior artifact normal EF. No ischemia --> (as compared to pre-PCI Myoview revealing inferolateral ischemia)  . TRANSTHORACIC ECHOCARDIOGRAM  11/2014   EF 65-70%, Mod LVH. Normal wall motion. Gr 1 DD. Normal valves.  Marland Kitchen VAGINAL HYSTERECTOMY     Family History  Problem Relation Age of Onset  . COPD Mother   . Pulmonary fibrosis Mother   . Heart attack Father   . Heart disease Father   . Stroke Father   . Cancer Sister   . Cancer Sister   . Clotting disorder Sister   . Heart attack Sister   . Arthritis    . Breast cancer Sister   . Colon cancer Neg Hx   . Esophageal cancer Neg Hx   . Stomach cancer Neg Hx    Social History   Occupational History  . retired    Social History Main Topics  . Smoking status: Never Smoker  . Smokeless tobacco: Never Used  . Alcohol use No  . Drug use: No  . Sexual activity: No    Tobacco Counseling Counseling given: Not Answered   Activities of Daily Living In your present state of health, do you have any difficulty  performing the following activities: 02/23/2016  Hearing? N  Vision? N  Difficulty concentrating or making decisions? Y  Walking or climbing stairs? Y  Dressing or bathing? Y  Doing errands, shopping? Y  Preparing Food and eating ? Y  Using the Toilet? N  In the past six months, have you accidently leaked urine? Y  Do you have problems with loss of bowel control? N  Managing your Medications? Y  Managing your Finances? Y  Housekeeping or managing your Housekeeping? Y  Some recent data might be hidden    Immunizations and Health Maintenance Immunization History  Administered Date(s) Administered  . Influenza,inj,Quad PF,36+ Mos 11/28/2012  . Influenza-Unspecified 12/29/2014, 11/12/2015  . PPD Test 12/29/2014  . Pneumococcal Conjugate-13 02/27/2006  . Pneumococcal-Unspecified 12/29/2014  . Zoster 03/30/2006   Health Maintenance Due  Topic Date Due  . FOOT EXAM  10/08/1939  . OPHTHALMOLOGY EXAM  10/08/1939  . URINE MICROALBUMIN  10[birth year]08/1941  . TETANUS/TDAP  10/09/1950  . HEMOGLOBIN A1C  11/18/2015    Patient Care Team: Leone Haven, MD as PCP - General (Family Medicine)  Indicate any recent Medical Services you may have received from other than [facility] providers in the past year (date may be approximate).     Assessment:   This is a routine wellness examination for Swara. The goal of the wellness visit is to assist the patient how to close the gaps in care and create a preventative care plan for the patient.   Taking calcium VIT D3 as appropriate/Osteoporosis risk reviewed.  Medications reviewed; taking without issues or barriers.  Safety issues reviewed; lives with daughter. Smoke detectors in the home. No firearms in the home. Wears seatbelts when riding with others. No violence in the home.  No identified risk were noted; The patient was oriented x 3; appropriate in dress and manner.  Assistance needed with bathing, dressing, meal prep, medication management,  home and financial management.  Walker in use when ambulating.  BMI; discussed the importance  of a healthy diet, water intake and exercise. Educational material provided.  Type 2 diabetes; followed by Endocrinology at [facility].  See care everywhere.    Patient Concerns: None at this time. Follow up with PCP as needed.  Hearing/Vision screen Hearing Screening Comments: Passes the whisper test Vision Screening Comments: Followed by [place] 01/2016 Quarterly visits Wears glasses Bilateral cataracts extracted  Dietary issues and exercise activities discussed: Current Exercise Habits: The patient does not participate in regular exercise at present  Goals    . Healthy Lifestyle          Stay hydrated and drink plenty of fluids/water Stay active and continue to do chair exercises.   Low carb foods.  Lean meats and vegetables      Depression Screen PHQ 2/9 Scores 02/23/2016  PHQ - 2 Score 0    Fall Risk Fall Risk  02/23/2016 04/12/2015  Falls in the past year? Yes Yes  Number falls in past yr: 1 1  Injury with Fall? No No  Follow up Falls prevention discussed;Education provided -    Cognitive Function: MMSE - Mini Mental State Exam 02/23/2016  Orientation to time 5  Orientation to Place 5  Registration 3  Attention/ Calculation 5  Recall 2  Language- name 2 objects 2  Language- repeat 1  Language- follow 3 step command 3  Language- read & follow direction 1  Write a sentence 1  Copy design 1  Total score 29        Screening Tests Health Maintenance  Topic Date Due  . FOOT EXAM  10/08/1939  . OPHTHALMOLOGY EXAM  10/08/1939  . URINE MICROALBUMIN  10/08/1939  . TETANUS/TDAP  10/09/1950  . HEMOGLOBIN A1C  11/18/2015  . INFLUENZA VACCINE  Completed  . DEXA SCAN  Completed  . ZOSTAVAX  Completed  . PNA vac Low Risk Adult  Completed      Plan:    End of life planning; Advance aging; Advanced directives discussed. Copy of current  HCPOA/Living Will requested.  Medicare Attestation I have personally reviewed: The patient's medical and social history Their use of alcohol, tobacco or illicit drugs Their current medications and supplements The patient's functional ability including ADLs,fall risks, home safety risks, cognitive, and hearing and visual impairment Diet and physical activities Evidence for depression   The patient's weight, height, BMI, and visual acuity have been recorded in the chart.  I have made referrals and provided education to the patient based on review of the above and I have provided the patient with a written personalized care plan for preventive services.    During the course of the visit, Viona was educated and counseled about the following appropriate screening and preventive services:   Vaccines to include Pneumoccal, Influenza, Hepatitis B, Td, Zostavax, HCV  Electrocardiogram  Cardiovascular disease screening  Colorectal cancer screening  Bone density screening  Diabetes screening  Glaucoma screening  Mammography/PAP  Nutrition counseling  Smoking cessation counseling  Patient Instructions (the written plan) were given to the patient.    Varney Biles, LPN   [phone]

## 2016-02-25 NOTE — Progress Notes (Signed)
I have reviewed the above note and agree.  Jamilyn Pigeon, M.D.  

## 2016-03-01 DIAGNOSIS — E119 Type 2 diabetes mellitus without complications: Secondary | ICD-10-CM | POA: Diagnosis not present

## 2016-03-01 DIAGNOSIS — E1122 Type 2 diabetes mellitus with diabetic chronic kidney disease: Secondary | ICD-10-CM | POA: Diagnosis not present

## 2016-03-01 DIAGNOSIS — Z794 Long term (current) use of insulin: Secondary | ICD-10-CM | POA: Diagnosis not present

## 2016-03-01 DIAGNOSIS — N183 Chronic kidney disease, stage 3 (moderate): Secondary | ICD-10-CM | POA: Diagnosis not present

## 2016-03-08 DIAGNOSIS — M1711 Unilateral primary osteoarthritis, right knee: Secondary | ICD-10-CM | POA: Diagnosis not present

## 2016-03-10 DIAGNOSIS — Z9181 History of falling: Secondary | ICD-10-CM | POA: Diagnosis not present

## 2016-03-10 DIAGNOSIS — M1711 Unilateral primary osteoarthritis, right knee: Secondary | ICD-10-CM | POA: Diagnosis not present

## 2016-03-10 DIAGNOSIS — M5116 Intervertebral disc disorders with radiculopathy, lumbar region: Secondary | ICD-10-CM | POA: Diagnosis not present

## 2016-03-10 DIAGNOSIS — E119 Type 2 diabetes mellitus without complications: Secondary | ICD-10-CM | POA: Diagnosis not present

## 2016-03-10 DIAGNOSIS — Z7901 Long term (current) use of anticoagulants: Secondary | ICD-10-CM | POA: Diagnosis not present

## 2016-03-10 DIAGNOSIS — E7800 Pure hypercholesterolemia, unspecified: Secondary | ICD-10-CM | POA: Diagnosis not present

## 2016-03-10 DIAGNOSIS — K219 Gastro-esophageal reflux disease without esophagitis: Secondary | ICD-10-CM | POA: Diagnosis not present

## 2016-03-10 DIAGNOSIS — I252 Old myocardial infarction: Secondary | ICD-10-CM | POA: Diagnosis not present

## 2016-03-11 ENCOUNTER — Other Ambulatory Visit: Payer: Self-pay | Admitting: Family Medicine

## 2016-03-13 ENCOUNTER — Other Ambulatory Visit: Payer: Self-pay | Admitting: Cardiology

## 2016-03-13 DIAGNOSIS — Z9181 History of falling: Secondary | ICD-10-CM | POA: Diagnosis not present

## 2016-03-13 DIAGNOSIS — K219 Gastro-esophageal reflux disease without esophagitis: Secondary | ICD-10-CM | POA: Diagnosis not present

## 2016-03-13 DIAGNOSIS — M5116 Intervertebral disc disorders with radiculopathy, lumbar region: Secondary | ICD-10-CM | POA: Diagnosis not present

## 2016-03-13 DIAGNOSIS — E7800 Pure hypercholesterolemia, unspecified: Secondary | ICD-10-CM | POA: Diagnosis not present

## 2016-03-13 DIAGNOSIS — M1711 Unilateral primary osteoarthritis, right knee: Secondary | ICD-10-CM | POA: Diagnosis not present

## 2016-03-13 DIAGNOSIS — I252 Old myocardial infarction: Secondary | ICD-10-CM | POA: Diagnosis not present

## 2016-03-13 DIAGNOSIS — Z7901 Long term (current) use of anticoagulants: Secondary | ICD-10-CM | POA: Diagnosis not present

## 2016-03-13 DIAGNOSIS — E119 Type 2 diabetes mellitus without complications: Secondary | ICD-10-CM | POA: Diagnosis not present

## 2016-03-13 NOTE — Telephone Encounter (Signed)
Rx(s) sent to pharmacy electronically.  

## 2016-03-13 NOTE — Telephone Encounter (Signed)
Last filled 02/10/16 30 0rf last OV 01/13/16

## 2016-03-14 DIAGNOSIS — M5116 Intervertebral disc disorders with radiculopathy, lumbar region: Secondary | ICD-10-CM | POA: Diagnosis not present

## 2016-03-14 DIAGNOSIS — M1711 Unilateral primary osteoarthritis, right knee: Secondary | ICD-10-CM | POA: Diagnosis not present

## 2016-03-14 DIAGNOSIS — E119 Type 2 diabetes mellitus without complications: Secondary | ICD-10-CM | POA: Diagnosis not present

## 2016-03-14 DIAGNOSIS — I252 Old myocardial infarction: Secondary | ICD-10-CM | POA: Diagnosis not present

## 2016-03-14 DIAGNOSIS — E7800 Pure hypercholesterolemia, unspecified: Secondary | ICD-10-CM | POA: Diagnosis not present

## 2016-03-14 DIAGNOSIS — Z7901 Long term (current) use of anticoagulants: Secondary | ICD-10-CM | POA: Diagnosis not present

## 2016-03-14 DIAGNOSIS — Z9181 History of falling: Secondary | ICD-10-CM | POA: Diagnosis not present

## 2016-03-14 DIAGNOSIS — K219 Gastro-esophageal reflux disease without esophagitis: Secondary | ICD-10-CM | POA: Diagnosis not present

## 2016-03-14 NOTE — Telephone Encounter (Signed)
faxed

## 2016-03-20 DIAGNOSIS — K219 Gastro-esophageal reflux disease without esophagitis: Secondary | ICD-10-CM | POA: Diagnosis not present

## 2016-03-20 DIAGNOSIS — E119 Type 2 diabetes mellitus without complications: Secondary | ICD-10-CM | POA: Diagnosis not present

## 2016-03-20 DIAGNOSIS — Z9181 History of falling: Secondary | ICD-10-CM | POA: Diagnosis not present

## 2016-03-20 DIAGNOSIS — Z7901 Long term (current) use of anticoagulants: Secondary | ICD-10-CM | POA: Diagnosis not present

## 2016-03-20 DIAGNOSIS — M5116 Intervertebral disc disorders with radiculopathy, lumbar region: Secondary | ICD-10-CM | POA: Diagnosis not present

## 2016-03-20 DIAGNOSIS — M1711 Unilateral primary osteoarthritis, right knee: Secondary | ICD-10-CM | POA: Diagnosis not present

## 2016-03-20 DIAGNOSIS — I252 Old myocardial infarction: Secondary | ICD-10-CM | POA: Diagnosis not present

## 2016-03-20 DIAGNOSIS — E7800 Pure hypercholesterolemia, unspecified: Secondary | ICD-10-CM | POA: Diagnosis not present

## 2016-03-22 DIAGNOSIS — M5116 Intervertebral disc disorders with radiculopathy, lumbar region: Secondary | ICD-10-CM | POA: Diagnosis not present

## 2016-03-22 DIAGNOSIS — Z7901 Long term (current) use of anticoagulants: Secondary | ICD-10-CM | POA: Diagnosis not present

## 2016-03-22 DIAGNOSIS — Z9181 History of falling: Secondary | ICD-10-CM | POA: Diagnosis not present

## 2016-03-22 DIAGNOSIS — M1711 Unilateral primary osteoarthritis, right knee: Secondary | ICD-10-CM | POA: Diagnosis not present

## 2016-03-22 DIAGNOSIS — K219 Gastro-esophageal reflux disease without esophagitis: Secondary | ICD-10-CM | POA: Diagnosis not present

## 2016-03-22 DIAGNOSIS — E119 Type 2 diabetes mellitus without complications: Secondary | ICD-10-CM | POA: Diagnosis not present

## 2016-03-22 DIAGNOSIS — E7800 Pure hypercholesterolemia, unspecified: Secondary | ICD-10-CM | POA: Diagnosis not present

## 2016-03-22 DIAGNOSIS — I252 Old myocardial infarction: Secondary | ICD-10-CM | POA: Diagnosis not present

## 2016-03-23 DIAGNOSIS — M1711 Unilateral primary osteoarthritis, right knee: Secondary | ICD-10-CM | POA: Diagnosis not present

## 2016-03-23 DIAGNOSIS — Z9181 History of falling: Secondary | ICD-10-CM | POA: Diagnosis not present

## 2016-03-23 DIAGNOSIS — I252 Old myocardial infarction: Secondary | ICD-10-CM | POA: Diagnosis not present

## 2016-03-23 DIAGNOSIS — E7800 Pure hypercholesterolemia, unspecified: Secondary | ICD-10-CM | POA: Diagnosis not present

## 2016-03-23 DIAGNOSIS — M5116 Intervertebral disc disorders with radiculopathy, lumbar region: Secondary | ICD-10-CM | POA: Diagnosis not present

## 2016-03-23 DIAGNOSIS — E119 Type 2 diabetes mellitus without complications: Secondary | ICD-10-CM | POA: Diagnosis not present

## 2016-03-23 DIAGNOSIS — Z7901 Long term (current) use of anticoagulants: Secondary | ICD-10-CM | POA: Diagnosis not present

## 2016-03-23 DIAGNOSIS — K219 Gastro-esophageal reflux disease without esophagitis: Secondary | ICD-10-CM | POA: Diagnosis not present

## 2016-03-27 DIAGNOSIS — Z7901 Long term (current) use of anticoagulants: Secondary | ICD-10-CM | POA: Diagnosis not present

## 2016-03-27 DIAGNOSIS — E119 Type 2 diabetes mellitus without complications: Secondary | ICD-10-CM | POA: Diagnosis not present

## 2016-03-27 DIAGNOSIS — K219 Gastro-esophageal reflux disease without esophagitis: Secondary | ICD-10-CM | POA: Diagnosis not present

## 2016-03-27 DIAGNOSIS — E7800 Pure hypercholesterolemia, unspecified: Secondary | ICD-10-CM | POA: Diagnosis not present

## 2016-03-27 DIAGNOSIS — M1711 Unilateral primary osteoarthritis, right knee: Secondary | ICD-10-CM | POA: Diagnosis not present

## 2016-03-27 DIAGNOSIS — M5116 Intervertebral disc disorders with radiculopathy, lumbar region: Secondary | ICD-10-CM | POA: Diagnosis not present

## 2016-03-27 DIAGNOSIS — Z9181 History of falling: Secondary | ICD-10-CM | POA: Diagnosis not present

## 2016-03-27 DIAGNOSIS — I252 Old myocardial infarction: Secondary | ICD-10-CM | POA: Diagnosis not present

## 2016-03-29 DIAGNOSIS — I252 Old myocardial infarction: Secondary | ICD-10-CM | POA: Diagnosis not present

## 2016-03-29 DIAGNOSIS — K219 Gastro-esophageal reflux disease without esophagitis: Secondary | ICD-10-CM | POA: Diagnosis not present

## 2016-03-29 DIAGNOSIS — E7800 Pure hypercholesterolemia, unspecified: Secondary | ICD-10-CM | POA: Diagnosis not present

## 2016-03-29 DIAGNOSIS — Z7901 Long term (current) use of anticoagulants: Secondary | ICD-10-CM | POA: Diagnosis not present

## 2016-03-29 DIAGNOSIS — Z9181 History of falling: Secondary | ICD-10-CM | POA: Diagnosis not present

## 2016-03-29 DIAGNOSIS — E119 Type 2 diabetes mellitus without complications: Secondary | ICD-10-CM | POA: Diagnosis not present

## 2016-03-29 DIAGNOSIS — M5116 Intervertebral disc disorders with radiculopathy, lumbar region: Secondary | ICD-10-CM | POA: Diagnosis not present

## 2016-03-29 DIAGNOSIS — M1711 Unilateral primary osteoarthritis, right knee: Secondary | ICD-10-CM | POA: Diagnosis not present

## 2016-03-30 DIAGNOSIS — E7800 Pure hypercholesterolemia, unspecified: Secondary | ICD-10-CM | POA: Diagnosis not present

## 2016-03-30 DIAGNOSIS — I252 Old myocardial infarction: Secondary | ICD-10-CM | POA: Diagnosis not present

## 2016-03-30 DIAGNOSIS — Z9181 History of falling: Secondary | ICD-10-CM | POA: Diagnosis not present

## 2016-03-30 DIAGNOSIS — K219 Gastro-esophageal reflux disease without esophagitis: Secondary | ICD-10-CM | POA: Diagnosis not present

## 2016-03-30 DIAGNOSIS — E119 Type 2 diabetes mellitus without complications: Secondary | ICD-10-CM | POA: Diagnosis not present

## 2016-03-30 DIAGNOSIS — M5116 Intervertebral disc disorders with radiculopathy, lumbar region: Secondary | ICD-10-CM | POA: Diagnosis not present

## 2016-03-30 DIAGNOSIS — Z7901 Long term (current) use of anticoagulants: Secondary | ICD-10-CM | POA: Diagnosis not present

## 2016-03-30 DIAGNOSIS — M1711 Unilateral primary osteoarthritis, right knee: Secondary | ICD-10-CM | POA: Diagnosis not present

## 2016-04-03 DIAGNOSIS — E119 Type 2 diabetes mellitus without complications: Secondary | ICD-10-CM | POA: Diagnosis not present

## 2016-04-03 DIAGNOSIS — K219 Gastro-esophageal reflux disease without esophagitis: Secondary | ICD-10-CM | POA: Diagnosis not present

## 2016-04-03 DIAGNOSIS — Z9181 History of falling: Secondary | ICD-10-CM | POA: Diagnosis not present

## 2016-04-03 DIAGNOSIS — Z7901 Long term (current) use of anticoagulants: Secondary | ICD-10-CM | POA: Diagnosis not present

## 2016-04-03 DIAGNOSIS — M5116 Intervertebral disc disorders with radiculopathy, lumbar region: Secondary | ICD-10-CM | POA: Diagnosis not present

## 2016-04-03 DIAGNOSIS — E7800 Pure hypercholesterolemia, unspecified: Secondary | ICD-10-CM | POA: Diagnosis not present

## 2016-04-03 DIAGNOSIS — I252 Old myocardial infarction: Secondary | ICD-10-CM | POA: Diagnosis not present

## 2016-04-03 DIAGNOSIS — M1711 Unilateral primary osteoarthritis, right knee: Secondary | ICD-10-CM | POA: Diagnosis not present

## 2016-04-04 DIAGNOSIS — H0014 Chalazion left upper eyelid: Secondary | ICD-10-CM | POA: Diagnosis not present

## 2016-04-05 DIAGNOSIS — E119 Type 2 diabetes mellitus without complications: Secondary | ICD-10-CM | POA: Diagnosis not present

## 2016-04-05 DIAGNOSIS — E7800 Pure hypercholesterolemia, unspecified: Secondary | ICD-10-CM | POA: Diagnosis not present

## 2016-04-05 DIAGNOSIS — K219 Gastro-esophageal reflux disease without esophagitis: Secondary | ICD-10-CM | POA: Diagnosis not present

## 2016-04-05 DIAGNOSIS — M1711 Unilateral primary osteoarthritis, right knee: Secondary | ICD-10-CM | POA: Diagnosis not present

## 2016-04-05 DIAGNOSIS — Z7901 Long term (current) use of anticoagulants: Secondary | ICD-10-CM | POA: Diagnosis not present

## 2016-04-05 DIAGNOSIS — I252 Old myocardial infarction: Secondary | ICD-10-CM | POA: Diagnosis not present

## 2016-04-05 DIAGNOSIS — Z9181 History of falling: Secondary | ICD-10-CM | POA: Diagnosis not present

## 2016-04-05 DIAGNOSIS — M5116 Intervertebral disc disorders with radiculopathy, lumbar region: Secondary | ICD-10-CM | POA: Diagnosis not present

## 2016-04-06 DIAGNOSIS — E119 Type 2 diabetes mellitus without complications: Secondary | ICD-10-CM | POA: Diagnosis not present

## 2016-04-06 DIAGNOSIS — I252 Old myocardial infarction: Secondary | ICD-10-CM | POA: Diagnosis not present

## 2016-04-06 DIAGNOSIS — M5116 Intervertebral disc disorders with radiculopathy, lumbar region: Secondary | ICD-10-CM | POA: Diagnosis not present

## 2016-04-06 DIAGNOSIS — K219 Gastro-esophageal reflux disease without esophagitis: Secondary | ICD-10-CM | POA: Diagnosis not present

## 2016-04-06 DIAGNOSIS — Z9181 History of falling: Secondary | ICD-10-CM | POA: Diagnosis not present

## 2016-04-06 DIAGNOSIS — M1711 Unilateral primary osteoarthritis, right knee: Secondary | ICD-10-CM | POA: Diagnosis not present

## 2016-04-06 DIAGNOSIS — E7800 Pure hypercholesterolemia, unspecified: Secondary | ICD-10-CM | POA: Diagnosis not present

## 2016-04-06 DIAGNOSIS — Z7901 Long term (current) use of anticoagulants: Secondary | ICD-10-CM | POA: Diagnosis not present

## 2016-04-10 DIAGNOSIS — K219 Gastro-esophageal reflux disease without esophagitis: Secondary | ICD-10-CM | POA: Diagnosis not present

## 2016-04-10 DIAGNOSIS — M5116 Intervertebral disc disorders with radiculopathy, lumbar region: Secondary | ICD-10-CM | POA: Diagnosis not present

## 2016-04-10 DIAGNOSIS — Z9181 History of falling: Secondary | ICD-10-CM | POA: Diagnosis not present

## 2016-04-10 DIAGNOSIS — E119 Type 2 diabetes mellitus without complications: Secondary | ICD-10-CM | POA: Diagnosis not present

## 2016-04-10 DIAGNOSIS — Z7901 Long term (current) use of anticoagulants: Secondary | ICD-10-CM | POA: Diagnosis not present

## 2016-04-10 DIAGNOSIS — E7800 Pure hypercholesterolemia, unspecified: Secondary | ICD-10-CM | POA: Diagnosis not present

## 2016-04-10 DIAGNOSIS — I252 Old myocardial infarction: Secondary | ICD-10-CM | POA: Diagnosis not present

## 2016-04-10 DIAGNOSIS — M1711 Unilateral primary osteoarthritis, right knee: Secondary | ICD-10-CM | POA: Diagnosis not present

## 2016-04-12 DIAGNOSIS — M5116 Intervertebral disc disorders with radiculopathy, lumbar region: Secondary | ICD-10-CM | POA: Diagnosis not present

## 2016-04-12 DIAGNOSIS — K219 Gastro-esophageal reflux disease without esophagitis: Secondary | ICD-10-CM | POA: Diagnosis not present

## 2016-04-12 DIAGNOSIS — Z9181 History of falling: Secondary | ICD-10-CM | POA: Diagnosis not present

## 2016-04-12 DIAGNOSIS — E7800 Pure hypercholesterolemia, unspecified: Secondary | ICD-10-CM | POA: Diagnosis not present

## 2016-04-12 DIAGNOSIS — E119 Type 2 diabetes mellitus without complications: Secondary | ICD-10-CM | POA: Diagnosis not present

## 2016-04-12 DIAGNOSIS — M1711 Unilateral primary osteoarthritis, right knee: Secondary | ICD-10-CM | POA: Diagnosis not present

## 2016-04-12 DIAGNOSIS — I252 Old myocardial infarction: Secondary | ICD-10-CM | POA: Diagnosis not present

## 2016-04-12 DIAGNOSIS — Z7901 Long term (current) use of anticoagulants: Secondary | ICD-10-CM | POA: Diagnosis not present

## 2016-04-13 DIAGNOSIS — H0014 Chalazion left upper eyelid: Secondary | ICD-10-CM | POA: Diagnosis not present

## 2016-04-19 DIAGNOSIS — K219 Gastro-esophageal reflux disease without esophagitis: Secondary | ICD-10-CM | POA: Diagnosis not present

## 2016-04-19 DIAGNOSIS — E7800 Pure hypercholesterolemia, unspecified: Secondary | ICD-10-CM | POA: Diagnosis not present

## 2016-04-19 DIAGNOSIS — I252 Old myocardial infarction: Secondary | ICD-10-CM | POA: Diagnosis not present

## 2016-04-19 DIAGNOSIS — M5116 Intervertebral disc disorders with radiculopathy, lumbar region: Secondary | ICD-10-CM | POA: Diagnosis not present

## 2016-04-19 DIAGNOSIS — Z9181 History of falling: Secondary | ICD-10-CM | POA: Diagnosis not present

## 2016-04-19 DIAGNOSIS — M1711 Unilateral primary osteoarthritis, right knee: Secondary | ICD-10-CM | POA: Diagnosis not present

## 2016-04-19 DIAGNOSIS — Z7901 Long term (current) use of anticoagulants: Secondary | ICD-10-CM | POA: Diagnosis not present

## 2016-04-19 DIAGNOSIS — E119 Type 2 diabetes mellitus without complications: Secondary | ICD-10-CM | POA: Diagnosis not present

## 2016-04-20 DIAGNOSIS — I252 Old myocardial infarction: Secondary | ICD-10-CM | POA: Diagnosis not present

## 2016-04-20 DIAGNOSIS — M5116 Intervertebral disc disorders with radiculopathy, lumbar region: Secondary | ICD-10-CM | POA: Diagnosis not present

## 2016-04-20 DIAGNOSIS — Z9181 History of falling: Secondary | ICD-10-CM | POA: Diagnosis not present

## 2016-04-20 DIAGNOSIS — E7800 Pure hypercholesterolemia, unspecified: Secondary | ICD-10-CM | POA: Diagnosis not present

## 2016-04-20 DIAGNOSIS — M1711 Unilateral primary osteoarthritis, right knee: Secondary | ICD-10-CM | POA: Diagnosis not present

## 2016-04-20 DIAGNOSIS — K219 Gastro-esophageal reflux disease without esophagitis: Secondary | ICD-10-CM | POA: Diagnosis not present

## 2016-04-20 DIAGNOSIS — E119 Type 2 diabetes mellitus without complications: Secondary | ICD-10-CM | POA: Diagnosis not present

## 2016-04-20 DIAGNOSIS — Z7901 Long term (current) use of anticoagulants: Secondary | ICD-10-CM | POA: Diagnosis not present

## 2016-04-24 DIAGNOSIS — E119 Type 2 diabetes mellitus without complications: Secondary | ICD-10-CM | POA: Diagnosis not present

## 2016-04-24 DIAGNOSIS — M1711 Unilateral primary osteoarthritis, right knee: Secondary | ICD-10-CM | POA: Diagnosis not present

## 2016-04-24 DIAGNOSIS — I252 Old myocardial infarction: Secondary | ICD-10-CM | POA: Diagnosis not present

## 2016-04-24 DIAGNOSIS — M5116 Intervertebral disc disorders with radiculopathy, lumbar region: Secondary | ICD-10-CM | POA: Diagnosis not present

## 2016-04-24 DIAGNOSIS — Z7901 Long term (current) use of anticoagulants: Secondary | ICD-10-CM | POA: Diagnosis not present

## 2016-04-24 DIAGNOSIS — K219 Gastro-esophageal reflux disease without esophagitis: Secondary | ICD-10-CM | POA: Diagnosis not present

## 2016-04-24 DIAGNOSIS — E7800 Pure hypercholesterolemia, unspecified: Secondary | ICD-10-CM | POA: Diagnosis not present

## 2016-04-24 DIAGNOSIS — Z9181 History of falling: Secondary | ICD-10-CM | POA: Diagnosis not present

## 2016-04-25 ENCOUNTER — Other Ambulatory Visit: Payer: Self-pay

## 2016-04-25 DIAGNOSIS — R310 Gross hematuria: Secondary | ICD-10-CM | POA: Diagnosis not present

## 2016-04-25 DIAGNOSIS — N3281 Overactive bladder: Secondary | ICD-10-CM | POA: Diagnosis not present

## 2016-04-25 NOTE — Telephone Encounter (Signed)
rx request from [facility], called patient to verify she uses [facility] and she uses 90 day supplies

## 2016-04-26 DIAGNOSIS — I252 Old myocardial infarction: Secondary | ICD-10-CM | POA: Diagnosis not present

## 2016-04-26 DIAGNOSIS — Z9181 History of falling: Secondary | ICD-10-CM | POA: Diagnosis not present

## 2016-04-26 DIAGNOSIS — Z7901 Long term (current) use of anticoagulants: Secondary | ICD-10-CM | POA: Diagnosis not present

## 2016-04-26 DIAGNOSIS — M1711 Unilateral primary osteoarthritis, right knee: Secondary | ICD-10-CM | POA: Diagnosis not present

## 2016-04-26 DIAGNOSIS — E7800 Pure hypercholesterolemia, unspecified: Secondary | ICD-10-CM | POA: Diagnosis not present

## 2016-04-26 DIAGNOSIS — E119 Type 2 diabetes mellitus without complications: Secondary | ICD-10-CM | POA: Diagnosis not present

## 2016-04-26 DIAGNOSIS — K219 Gastro-esophageal reflux disease without esophagitis: Secondary | ICD-10-CM | POA: Diagnosis not present

## 2016-04-26 DIAGNOSIS — M5116 Intervertebral disc disorders with radiculopathy, lumbar region: Secondary | ICD-10-CM | POA: Diagnosis not present

## 2016-04-27 MED ORDER — PANTOPRAZOLE SODIUM 40 MG PO TBEC: 40.0000 mg | DELAYED_RELEASE_TABLET | Freq: Every day | ORAL | 2 refills | Status: DC

## 2016-04-27 MED ORDER — PAROXETINE HCL 10 MG PO TABS: 10.0000 mg | ORAL_TABLET | Freq: Every day | ORAL | 2 refills | Status: DC

## 2016-04-27 NOTE — Telephone Encounter (Signed)
Last OV 12/13/15 last filled  Paxil 11/15/15 30 3rf Protonix 12/20/15 30 4rf

## 2016-04-30 ENCOUNTER — Other Ambulatory Visit: Payer: Self-pay | Admitting: Family Medicine

## 2016-05-01 DIAGNOSIS — M1711 Unilateral primary osteoarthritis, right knee: Secondary | ICD-10-CM | POA: Diagnosis not present

## 2016-05-01 DIAGNOSIS — K219 Gastro-esophageal reflux disease without esophagitis: Secondary | ICD-10-CM | POA: Diagnosis not present

## 2016-05-01 DIAGNOSIS — Z9181 History of falling: Secondary | ICD-10-CM | POA: Diagnosis not present

## 2016-05-01 DIAGNOSIS — I252 Old myocardial infarction: Secondary | ICD-10-CM | POA: Diagnosis not present

## 2016-05-01 DIAGNOSIS — M5116 Intervertebral disc disorders with radiculopathy, lumbar region: Secondary | ICD-10-CM | POA: Diagnosis not present

## 2016-05-01 DIAGNOSIS — E7800 Pure hypercholesterolemia, unspecified: Secondary | ICD-10-CM | POA: Diagnosis not present

## 2016-05-01 DIAGNOSIS — Z7901 Long term (current) use of anticoagulants: Secondary | ICD-10-CM | POA: Diagnosis not present

## 2016-05-01 DIAGNOSIS — E119 Type 2 diabetes mellitus without complications: Secondary | ICD-10-CM | POA: Diagnosis not present

## 2016-05-02 DIAGNOSIS — D1800 Hemangioma unspecified site: Secondary | ICD-10-CM | POA: Diagnosis not present

## 2016-05-02 DIAGNOSIS — I8310 Varicose veins of unspecified lower extremity with inflammation: Secondary | ICD-10-CM | POA: Diagnosis not present

## 2016-05-02 DIAGNOSIS — L821 Other seborrheic keratosis: Secondary | ICD-10-CM | POA: Diagnosis not present

## 2016-05-02 DIAGNOSIS — Z1283 Encounter for screening for malignant neoplasm of skin: Secondary | ICD-10-CM | POA: Diagnosis not present

## 2016-05-02 DIAGNOSIS — L820 Inflamed seborrheic keratosis: Secondary | ICD-10-CM | POA: Diagnosis not present

## 2016-05-03 ENCOUNTER — Telehealth: Payer: Self-pay

## 2016-05-03 DIAGNOSIS — M1711 Unilateral primary osteoarthritis, right knee: Secondary | ICD-10-CM | POA: Diagnosis not present

## 2016-05-03 DIAGNOSIS — I252 Old myocardial infarction: Secondary | ICD-10-CM | POA: Diagnosis not present

## 2016-05-03 DIAGNOSIS — M5116 Intervertebral disc disorders with radiculopathy, lumbar region: Secondary | ICD-10-CM | POA: Diagnosis not present

## 2016-05-03 DIAGNOSIS — E7800 Pure hypercholesterolemia, unspecified: Secondary | ICD-10-CM | POA: Diagnosis not present

## 2016-05-03 DIAGNOSIS — Z9181 History of falling: Secondary | ICD-10-CM | POA: Diagnosis not present

## 2016-05-03 DIAGNOSIS — E119 Type 2 diabetes mellitus without complications: Secondary | ICD-10-CM | POA: Diagnosis not present

## 2016-05-03 DIAGNOSIS — Z7901 Long term (current) use of anticoagulants: Secondary | ICD-10-CM | POA: Diagnosis not present

## 2016-05-03 DIAGNOSIS — K219 Gastro-esophageal reflux disease without esophagitis: Secondary | ICD-10-CM | POA: Diagnosis not present

## 2016-05-03 NOTE — Telephone Encounter (Signed)
Abigail Boyd from [facility] would like verbal order to extend Pt, order given per Dr.Sonnenberg

## 2016-05-08 DIAGNOSIS — M5116 Intervertebral disc disorders with radiculopathy, lumbar region: Secondary | ICD-10-CM | POA: Diagnosis not present

## 2016-05-08 DIAGNOSIS — M1711 Unilateral primary osteoarthritis, right knee: Secondary | ICD-10-CM | POA: Diagnosis not present

## 2016-05-08 DIAGNOSIS — Z9181 History of falling: Secondary | ICD-10-CM | POA: Diagnosis not present

## 2016-05-08 DIAGNOSIS — E119 Type 2 diabetes mellitus without complications: Secondary | ICD-10-CM | POA: Diagnosis not present

## 2016-05-08 DIAGNOSIS — E7800 Pure hypercholesterolemia, unspecified: Secondary | ICD-10-CM | POA: Diagnosis not present

## 2016-05-08 DIAGNOSIS — I252 Old myocardial infarction: Secondary | ICD-10-CM | POA: Diagnosis not present

## 2016-05-08 DIAGNOSIS — Z7901 Long term (current) use of anticoagulants: Secondary | ICD-10-CM | POA: Diagnosis not present

## 2016-05-08 DIAGNOSIS — K219 Gastro-esophageal reflux disease without esophagitis: Secondary | ICD-10-CM | POA: Diagnosis not present

## 2016-05-10 DIAGNOSIS — Z9181 History of falling: Secondary | ICD-10-CM | POA: Diagnosis not present

## 2016-05-10 DIAGNOSIS — K219 Gastro-esophageal reflux disease without esophagitis: Secondary | ICD-10-CM | POA: Diagnosis not present

## 2016-05-10 DIAGNOSIS — M5116 Intervertebral disc disorders with radiculopathy, lumbar region: Secondary | ICD-10-CM | POA: Diagnosis not present

## 2016-05-10 DIAGNOSIS — E7800 Pure hypercholesterolemia, unspecified: Secondary | ICD-10-CM | POA: Diagnosis not present

## 2016-05-10 DIAGNOSIS — M1711 Unilateral primary osteoarthritis, right knee: Secondary | ICD-10-CM | POA: Diagnosis not present

## 2016-05-10 DIAGNOSIS — E119 Type 2 diabetes mellitus without complications: Secondary | ICD-10-CM | POA: Diagnosis not present

## 2016-05-10 DIAGNOSIS — Z7901 Long term (current) use of anticoagulants: Secondary | ICD-10-CM | POA: Diagnosis not present

## 2016-05-10 DIAGNOSIS — I252 Old myocardial infarction: Secondary | ICD-10-CM | POA: Diagnosis not present

## 2016-05-15 ENCOUNTER — Telehealth: Payer: Self-pay | Admitting: Family Medicine

## 2016-05-15 DIAGNOSIS — M5116 Intervertebral disc disorders with radiculopathy, lumbar region: Secondary | ICD-10-CM | POA: Diagnosis not present

## 2016-05-15 DIAGNOSIS — Z9181 History of falling: Secondary | ICD-10-CM | POA: Diagnosis not present

## 2016-05-15 DIAGNOSIS — Z7901 Long term (current) use of anticoagulants: Secondary | ICD-10-CM | POA: Diagnosis not present

## 2016-05-15 DIAGNOSIS — K219 Gastro-esophageal reflux disease without esophagitis: Secondary | ICD-10-CM | POA: Diagnosis not present

## 2016-05-15 DIAGNOSIS — E119 Type 2 diabetes mellitus without complications: Secondary | ICD-10-CM | POA: Diagnosis not present

## 2016-05-15 DIAGNOSIS — M1711 Unilateral primary osteoarthritis, right knee: Secondary | ICD-10-CM | POA: Diagnosis not present

## 2016-05-15 DIAGNOSIS — E7800 Pure hypercholesterolemia, unspecified: Secondary | ICD-10-CM | POA: Diagnosis not present

## 2016-05-15 DIAGNOSIS — I252 Old myocardial infarction: Secondary | ICD-10-CM | POA: Diagnosis not present

## 2016-05-15 NOTE — Telephone Encounter (Signed)
Last OV 1016/17 last filled 03/13/16 20 0rf

## 2016-05-15 NOTE — Telephone Encounter (Signed)
Refill given. Patient needs follow-up set up with me given that it has been 5 months since we have seen her. Thanks.

## 2016-05-16 NOTE — Telephone Encounter (Signed)
Pt daughter [name] called back returning your call. Thank you!  Call pt @ [phone]

## 2016-05-16 NOTE — Telephone Encounter (Signed)
Left message to return call 

## 2016-05-17 DIAGNOSIS — E119 Type 2 diabetes mellitus without complications: Secondary | ICD-10-CM | POA: Diagnosis not present

## 2016-05-17 DIAGNOSIS — Z7901 Long term (current) use of anticoagulants: Secondary | ICD-10-CM | POA: Diagnosis not present

## 2016-05-17 DIAGNOSIS — I252 Old myocardial infarction: Secondary | ICD-10-CM | POA: Diagnosis not present

## 2016-05-17 DIAGNOSIS — Z9181 History of falling: Secondary | ICD-10-CM | POA: Diagnosis not present

## 2016-05-17 DIAGNOSIS — K219 Gastro-esophageal reflux disease without esophagitis: Secondary | ICD-10-CM | POA: Diagnosis not present

## 2016-05-17 DIAGNOSIS — M1711 Unilateral primary osteoarthritis, right knee: Secondary | ICD-10-CM | POA: Diagnosis not present

## 2016-05-17 DIAGNOSIS — M5116 Intervertebral disc disorders with radiculopathy, lumbar region: Secondary | ICD-10-CM | POA: Diagnosis not present

## 2016-05-17 DIAGNOSIS — E7800 Pure hypercholesterolemia, unspecified: Secondary | ICD-10-CM | POA: Diagnosis not present

## 2016-05-17 NOTE — Telephone Encounter (Signed)
Faxed, patient is scheduled for follow up

## 2016-05-22 DIAGNOSIS — M1711 Unilateral primary osteoarthritis, right knee: Secondary | ICD-10-CM | POA: Diagnosis not present

## 2016-05-22 DIAGNOSIS — E119 Type 2 diabetes mellitus without complications: Secondary | ICD-10-CM | POA: Diagnosis not present

## 2016-05-22 DIAGNOSIS — Z7901 Long term (current) use of anticoagulants: Secondary | ICD-10-CM | POA: Diagnosis not present

## 2016-05-22 DIAGNOSIS — K219 Gastro-esophageal reflux disease without esophagitis: Secondary | ICD-10-CM | POA: Diagnosis not present

## 2016-05-22 DIAGNOSIS — I252 Old myocardial infarction: Secondary | ICD-10-CM | POA: Diagnosis not present

## 2016-05-22 DIAGNOSIS — Z9181 History of falling: Secondary | ICD-10-CM | POA: Diagnosis not present

## 2016-05-22 DIAGNOSIS — E7800 Pure hypercholesterolemia, unspecified: Secondary | ICD-10-CM | POA: Diagnosis not present

## 2016-05-22 DIAGNOSIS — M5116 Intervertebral disc disorders with radiculopathy, lumbar region: Secondary | ICD-10-CM | POA: Diagnosis not present

## 2016-05-24 DIAGNOSIS — M5116 Intervertebral disc disorders with radiculopathy, lumbar region: Secondary | ICD-10-CM | POA: Diagnosis not present

## 2016-05-24 DIAGNOSIS — E7800 Pure hypercholesterolemia, unspecified: Secondary | ICD-10-CM | POA: Diagnosis not present

## 2016-05-24 DIAGNOSIS — M1711 Unilateral primary osteoarthritis, right knee: Secondary | ICD-10-CM | POA: Diagnosis not present

## 2016-05-24 DIAGNOSIS — K219 Gastro-esophageal reflux disease without esophagitis: Secondary | ICD-10-CM | POA: Diagnosis not present

## 2016-05-24 DIAGNOSIS — Z7901 Long term (current) use of anticoagulants: Secondary | ICD-10-CM | POA: Diagnosis not present

## 2016-05-24 DIAGNOSIS — I252 Old myocardial infarction: Secondary | ICD-10-CM | POA: Diagnosis not present

## 2016-05-24 DIAGNOSIS — E119 Type 2 diabetes mellitus without complications: Secondary | ICD-10-CM | POA: Diagnosis not present

## 2016-05-24 DIAGNOSIS — Z9181 History of falling: Secondary | ICD-10-CM | POA: Diagnosis not present

## 2016-05-29 DIAGNOSIS — Z9181 History of falling: Secondary | ICD-10-CM | POA: Diagnosis not present

## 2016-05-29 DIAGNOSIS — E119 Type 2 diabetes mellitus without complications: Secondary | ICD-10-CM | POA: Diagnosis not present

## 2016-05-29 DIAGNOSIS — E7800 Pure hypercholesterolemia, unspecified: Secondary | ICD-10-CM | POA: Diagnosis not present

## 2016-05-29 DIAGNOSIS — Z7901 Long term (current) use of anticoagulants: Secondary | ICD-10-CM | POA: Diagnosis not present

## 2016-05-29 DIAGNOSIS — M5116 Intervertebral disc disorders with radiculopathy, lumbar region: Secondary | ICD-10-CM | POA: Diagnosis not present

## 2016-05-29 DIAGNOSIS — M1711 Unilateral primary osteoarthritis, right knee: Secondary | ICD-10-CM | POA: Diagnosis not present

## 2016-05-29 DIAGNOSIS — I252 Old myocardial infarction: Secondary | ICD-10-CM | POA: Diagnosis not present

## 2016-05-29 DIAGNOSIS — K219 Gastro-esophageal reflux disease without esophagitis: Secondary | ICD-10-CM | POA: Diagnosis not present

## 2016-05-31 DIAGNOSIS — I252 Old myocardial infarction: Secondary | ICD-10-CM | POA: Diagnosis not present

## 2016-05-31 DIAGNOSIS — Z9181 History of falling: Secondary | ICD-10-CM | POA: Diagnosis not present

## 2016-05-31 DIAGNOSIS — Z7901 Long term (current) use of anticoagulants: Secondary | ICD-10-CM | POA: Diagnosis not present

## 2016-05-31 DIAGNOSIS — M1711 Unilateral primary osteoarthritis, right knee: Secondary | ICD-10-CM | POA: Diagnosis not present

## 2016-05-31 DIAGNOSIS — E7800 Pure hypercholesterolemia, unspecified: Secondary | ICD-10-CM | POA: Diagnosis not present

## 2016-05-31 DIAGNOSIS — E119 Type 2 diabetes mellitus without complications: Secondary | ICD-10-CM | POA: Diagnosis not present

## 2016-05-31 DIAGNOSIS — K219 Gastro-esophageal reflux disease without esophagitis: Secondary | ICD-10-CM | POA: Diagnosis not present

## 2016-05-31 DIAGNOSIS — M5116 Intervertebral disc disorders with radiculopathy, lumbar region: Secondary | ICD-10-CM | POA: Diagnosis not present

## 2016-06-05 DIAGNOSIS — E7800 Pure hypercholesterolemia, unspecified: Secondary | ICD-10-CM | POA: Diagnosis not present

## 2016-06-05 DIAGNOSIS — M1711 Unilateral primary osteoarthritis, right knee: Secondary | ICD-10-CM | POA: Diagnosis not present

## 2016-06-05 DIAGNOSIS — Z7901 Long term (current) use of anticoagulants: Secondary | ICD-10-CM | POA: Diagnosis not present

## 2016-06-05 DIAGNOSIS — Z9181 History of falling: Secondary | ICD-10-CM | POA: Diagnosis not present

## 2016-06-05 DIAGNOSIS — E119 Type 2 diabetes mellitus without complications: Secondary | ICD-10-CM | POA: Diagnosis not present

## 2016-06-05 DIAGNOSIS — K219 Gastro-esophageal reflux disease without esophagitis: Secondary | ICD-10-CM | POA: Diagnosis not present

## 2016-06-05 DIAGNOSIS — M5116 Intervertebral disc disorders with radiculopathy, lumbar region: Secondary | ICD-10-CM | POA: Diagnosis not present

## 2016-06-05 DIAGNOSIS — I252 Old myocardial infarction: Secondary | ICD-10-CM | POA: Diagnosis not present

## 2016-06-06 ENCOUNTER — Encounter: Payer: Self-pay | Admitting: Family Medicine

## 2016-06-06 ENCOUNTER — Ambulatory Visit (INDEPENDENT_AMBULATORY_CARE_PROVIDER_SITE_OTHER): Payer: Medicare Other | Admitting: Family Medicine

## 2016-06-06 VITALS — BP 148/74 | HR 72 | Temp 98.6°F | Resp 18 | Wt 195.1 lb

## 2016-06-06 DIAGNOSIS — F419 Anxiety disorder, unspecified: Secondary | ICD-10-CM

## 2016-06-06 DIAGNOSIS — G8929 Other chronic pain: Secondary | ICD-10-CM | POA: Diagnosis not present

## 2016-06-06 DIAGNOSIS — N182 Chronic kidney disease, stage 2 (mild): Secondary | ICD-10-CM

## 2016-06-06 DIAGNOSIS — Z8673 Personal history of transient ischemic attack (TIA), and cerebral infarction without residual deficits: Secondary | ICD-10-CM | POA: Diagnosis not present

## 2016-06-06 DIAGNOSIS — I10 Essential (primary) hypertension: Secondary | ICD-10-CM | POA: Diagnosis not present

## 2016-06-06 DIAGNOSIS — E1122 Type 2 diabetes mellitus with diabetic chronic kidney disease: Secondary | ICD-10-CM | POA: Diagnosis not present

## 2016-06-06 DIAGNOSIS — Z794 Long term (current) use of insulin: Secondary | ICD-10-CM

## 2016-06-06 DIAGNOSIS — M25561 Pain in right knee: Secondary | ICD-10-CM

## 2016-06-06 MED ORDER — ALPRAZOLAM 0.25 MG PO TABS: 0.2500 mg | ORAL_TABLET | Freq: Every evening | ORAL | 0 refills | Status: DC | PRN

## 2016-06-06 NOTE — Patient Instructions (Signed)
Nice to see you. We'll check some lab work today and contact you with the results.  

## 2016-06-06 NOTE — Progress Notes (Signed)
  Tommi Rumps, MD Phone: [phone]  Abigail Boyd is a 83 y.o. female who presents today for follow-up.  DIABETES Disease Monitoring: Blood Sugar ranges-140-200 Polyuria/phagia/dipsia- no      optho-UTD Medications: Compliance- taking it is 35 units twice daily, Humalog 22 units 3 times a day with meals, Januvia Hypoglycemic symptoms- no  HYPERTENSION  Disease Monitoring  Home BP Monitoring similar to today Chest pain- no    Dyspnea- no Medications  Compliance-  Taking metoprolol, lasix.  Edema- intermittent, no orthopnea, no PND, goes away if she props her legs up  History of stroke: No new numbness or weakness or vision changes. She's on Xarelto and Crestor. No bleeding issues.  Right knee meniscal injury: Followed by orthopedics for this. Had surgery on this. Also has arthritis in the knee. Recently saw orthopedics and had a steroid injection that was beneficial. She's doing physical therapy for this.  Anxiety: Currently on Paxil and Xanax. Notes sometimes she just can't calm herself down. Takes Xanax. No drowsiness or falls with this. No depression.  PMH: nonsmoker.   ROS see history of present illness  Objective  Physical Exam Vitals:   06/06/16 1608  BP: (!) 148/74  Pulse: 72  Resp: 18  Temp: 98.6 F (37 C)    BP Readings from Last 3 Encounters:  06/06/16 (!) 148/74  02/23/16 128/68  12/27/15 (!) 145/68   Wt Readings from Last 3 Encounters:  06/06/16 195 lb 2 oz (88.5 kg)  02/23/16 190 lb 1.9 oz (86.2 kg)  12/27/15 188 lb 9.6 oz (85.5 kg)    Physical Exam  Constitutional: No distress.  Cardiovascular: Normal rate, regular rhythm and normal heart sounds.   Pulmonary/Chest: Effort normal and breath sounds normal.  Musculoskeletal: She exhibits edema (trace edema).  Right knee with minimal joint line tenderness medially and laterally, no swelling, warmth, or erythema, no ligamentous laxity, negative McMurray's, left knee with no tenderness, swelling,  warmth, erythema, or ligamentous laxity, negative McMurray's  Neurological: She is alert.  CN 2-12 intact, 5/5 strength in bilateral biceps, triceps, grip, quads, hamstrings, plantar and dorsiflexion, sensation to light touch intact in bilateral UE and LE  Skin: Skin is warm and dry. She is not diaphoretic.     Assessment/Plan: Please see individual problem list.  History of cardioembolic cerebrovascular accident (CVA) Doing well on Xarelto. No new neurological symptoms. Neurologically intact. We'll continue Xarelto. She'll continue to monitor.  Essential hypertension At goal with age. Continue current medications.  Diabetes mellitus ([place]) Seems to be well-controlled. Plan to check A1c. Continue current medications through endocrinology.  Right knee pain Related to meniscal issue and osteoarthritis. Following with orthopedics. She'll continue physical therapy. Continue to monitor.  Anxiety Stable. No issues with the Xanax. Refills given. Warned that this could make her drowsy.   Orders Placed This Encounter  Procedures  . Comp Met (CMET)  . HgB A1c    Meds ordered this encounter  Medications  . ALPRAZolam (XANAX) 0.25 MG tablet    Sig: Take 1 tablet (0.25 mg total) by mouth at bedtime as needed. for sleep    Dispense:  90 tablet    Refill:  0    Not to exceed 5 additional fills before 09/10/2016   Tommi Rumps, MD [place]

## 2016-06-06 NOTE — Assessment & Plan Note (Addendum)
Stable. No issues with the Xanax. Refills given. Warned that this could make her drowsy.

## 2016-06-06 NOTE — Assessment & Plan Note (Signed)
Related to meniscal issue and osteoarthritis. Following with orthopedics. She'll continue physical therapy. Continue to monitor.

## 2016-06-06 NOTE — Assessment & Plan Note (Signed)
Doing well on Xarelto. No new neurological symptoms. Neurologically intact. We'll continue Xarelto. She'll continue to monitor.

## 2016-06-06 NOTE — Progress Notes (Signed)
Pre visit review using our clinic review tool, if applicable. No additional management support is needed unless otherwise documented below in the visit note. 

## 2016-06-06 NOTE — Assessment & Plan Note (Signed)
Seems to be well-controlled. Plan to check A1c. Continue current medications through endocrinology.

## 2016-06-06 NOTE — Assessment & Plan Note (Signed)
At goal with age. Continue current medications.

## 2016-06-07 DIAGNOSIS — M1711 Unilateral primary osteoarthritis, right knee: Secondary | ICD-10-CM | POA: Diagnosis not present

## 2016-06-07 DIAGNOSIS — I252 Old myocardial infarction: Secondary | ICD-10-CM | POA: Diagnosis not present

## 2016-06-07 DIAGNOSIS — K219 Gastro-esophageal reflux disease without esophagitis: Secondary | ICD-10-CM | POA: Diagnosis not present

## 2016-06-07 DIAGNOSIS — E7800 Pure hypercholesterolemia, unspecified: Secondary | ICD-10-CM | POA: Diagnosis not present

## 2016-06-07 DIAGNOSIS — Z7901 Long term (current) use of anticoagulants: Secondary | ICD-10-CM | POA: Diagnosis not present

## 2016-06-07 DIAGNOSIS — E119 Type 2 diabetes mellitus without complications: Secondary | ICD-10-CM | POA: Diagnosis not present

## 2016-06-07 DIAGNOSIS — Z9181 History of falling: Secondary | ICD-10-CM | POA: Diagnosis not present

## 2016-06-07 DIAGNOSIS — M5116 Intervertebral disc disorders with radiculopathy, lumbar region: Secondary | ICD-10-CM | POA: Diagnosis not present

## 2016-06-07 LAB — COMPREHENSIVE METABOLIC PANEL
ALK PHOS: 57 U/L (ref 39–117)
ALT: 15 U/L (ref 0–35)
AST: 13 U/L (ref 0–37)
Albumin: 4.1 g/dL (ref 3.5–5.2)
BILIRUBIN TOTAL: 0.2 mg/dL (ref 0.2–1.2)
BUN: 26 mg/dL — AB (ref 6–23)
CO2: 27 mEq/L (ref 19–32)
CREATININE: 1.19 mg/dL (ref 0.40–1.20)
Calcium: 9.5 mg/dL (ref 8.4–10.5)
Chloride: 103 mEq/L (ref 96–112)
GFR: 45.86 mL/min — ABNORMAL LOW (ref >60.00)
GLUCOSE: 199 mg/dL — AB (ref 70–99)
Potassium: 4.3 mEq/L (ref 3.5–5.1)
SODIUM: 138 meq/L (ref 135–145)
TOTAL PROTEIN: 7.1 g/dL (ref 6.0–8.3)

## 2016-06-07 LAB — HEMOGLOBIN A1C: Hgb A1c MFr Bld: 8.1 % — ABNORMAL HIGH (ref 4.6–6.5)

## 2016-06-12 DIAGNOSIS — K219 Gastro-esophageal reflux disease without esophagitis: Secondary | ICD-10-CM | POA: Diagnosis not present

## 2016-06-12 DIAGNOSIS — Z7901 Long term (current) use of anticoagulants: Secondary | ICD-10-CM | POA: Diagnosis not present

## 2016-06-12 DIAGNOSIS — M5116 Intervertebral disc disorders with radiculopathy, lumbar region: Secondary | ICD-10-CM | POA: Diagnosis not present

## 2016-06-12 DIAGNOSIS — E7800 Pure hypercholesterolemia, unspecified: Secondary | ICD-10-CM | POA: Diagnosis not present

## 2016-06-12 DIAGNOSIS — E119 Type 2 diabetes mellitus without complications: Secondary | ICD-10-CM | POA: Diagnosis not present

## 2016-06-12 DIAGNOSIS — Z9181 History of falling: Secondary | ICD-10-CM | POA: Diagnosis not present

## 2016-06-12 DIAGNOSIS — I252 Old myocardial infarction: Secondary | ICD-10-CM | POA: Diagnosis not present

## 2016-06-12 DIAGNOSIS — M1711 Unilateral primary osteoarthritis, right knee: Secondary | ICD-10-CM | POA: Diagnosis not present

## 2016-06-14 ENCOUNTER — Encounter: Payer: Self-pay | Admitting: Cardiology

## 2016-06-14 ENCOUNTER — Ambulatory Visit (INDEPENDENT_AMBULATORY_CARE_PROVIDER_SITE_OTHER): Payer: Medicare Other | Admitting: Cardiology

## 2016-06-14 VITALS — BP 138/70 | HR 78 | Ht 63.0 in | Wt 193.0 lb

## 2016-06-14 DIAGNOSIS — E785 Hyperlipidemia, unspecified: Secondary | ICD-10-CM | POA: Diagnosis not present

## 2016-06-14 DIAGNOSIS — I480 Paroxysmal atrial fibrillation: Secondary | ICD-10-CM

## 2016-06-14 DIAGNOSIS — Z7901 Long term (current) use of anticoagulants: Secondary | ICD-10-CM

## 2016-06-14 DIAGNOSIS — E7800 Pure hypercholesterolemia, unspecified: Secondary | ICD-10-CM | POA: Diagnosis not present

## 2016-06-14 DIAGNOSIS — E119 Type 2 diabetes mellitus without complications: Secondary | ICD-10-CM | POA: Diagnosis not present

## 2016-06-14 DIAGNOSIS — I2510 Atherosclerotic heart disease of native coronary artery without angina pectoris: Secondary | ICD-10-CM

## 2016-06-14 DIAGNOSIS — I10 Essential (primary) hypertension: Secondary | ICD-10-CM

## 2016-06-14 DIAGNOSIS — I200 Unstable angina: Secondary | ICD-10-CM

## 2016-06-14 DIAGNOSIS — Z9861 Coronary angioplasty status: Secondary | ICD-10-CM

## 2016-06-14 DIAGNOSIS — Z9181 History of falling: Secondary | ICD-10-CM | POA: Diagnosis not present

## 2016-06-14 DIAGNOSIS — R55 Syncope and collapse: Secondary | ICD-10-CM

## 2016-06-14 DIAGNOSIS — R600 Localized edema: Secondary | ICD-10-CM | POA: Diagnosis not present

## 2016-06-14 DIAGNOSIS — K219 Gastro-esophageal reflux disease without esophagitis: Secondary | ICD-10-CM | POA: Diagnosis not present

## 2016-06-14 DIAGNOSIS — I252 Old myocardial infarction: Secondary | ICD-10-CM | POA: Diagnosis not present

## 2016-06-14 DIAGNOSIS — M5116 Intervertebral disc disorders with radiculopathy, lumbar region: Secondary | ICD-10-CM | POA: Diagnosis not present

## 2016-06-14 DIAGNOSIS — M1711 Unilateral primary osteoarthritis, right knee: Secondary | ICD-10-CM | POA: Diagnosis not present

## 2016-06-14 NOTE — Patient Instructions (Signed)
NO CHANGES WITH CURRENT TREATMENT    Your physician wants you to follow-up in [place]. You will receive a reminder letter in the mail two months in advance. If you don't receive a letter, please call our office to schedule the follow-up appointment.   If you need a refill on your cardiac medications before your next appointment, please call your pharmacy.

## 2016-06-14 NOTE — Progress Notes (Signed)
PCP: Tommi Rumps, MD  Clinic Note: Chief Complaint  Patient presents with  . Follow-up    6 month  . Coronary Artery Disease  . Atrial Fibrillation    HPI: Abigail Boyd is a 83 y.o. female with a PMH below who presents today for 6 month f/u CAD-PCI, Afib & recurrent syncope.   Abigail Boyd was last seen In late October 2017 doing very well overall. The main thing was that she is now very limited with mobility due to her worsening knee pain. She is now most the time in a wheelchair only doing transfer walking at home.  Recent Hospitalizations: None  Studies Reviewed: None  Interval History: Abigail Boyd presents today actually doing quite well from a cardiac standpoint. She has trace swelling bilaterally which is usually well controlled. She is reluctant to take extra Lasix, mostly because is so difficult for her to go to the bathroom. She may take an additional dose once or twice a month. Thankfully however she denies any PND or orthopnea and edema is pretty well-controlled. She is trying to increase her mobility doing some therapeutic exercises but is really been bothered by the knee so much that she is becoming quite deconditioned. She clearly notes exertional dyspnea, that is not a real change. She doesn't really notice the atrial fibrillation unless she is exerting herself when her heart rate is getting up higher. Otherwise her routine activity she doesn't have any symptoms. She when she uses the walker and walks some speeds up and she notes it twice a day regularly. Abigail Boyd she has not had any further syncope or near-syncope symptoms. No TIA or amaurosis fugax symptoms. No chest tightness or pressure with rest or exertion.  She denies any claudication symptoms.    ROS: A comprehensive was performed. Review of Systems  Constitutional: Positive for malaise/fatigue (Mostly because she doesn't do hardly anything.).  HENT: Negative for nosebleeds.   Respiratory: Positive for shortness of  breath (No more than baseline).   Gastrointestinal: Negative for blood in stool, constipation and melena.  Genitourinary: Negative for hematuria.  Musculoskeletal: Positive for joint pain (Severe right knee pain).  Neurological: Positive for dizziness (If she stands up quickly).  Endo/Heme/Allergies: Positive for environmental allergies. Bruises/bleeds easily.  Psychiatric/Behavioral: Positive for memory loss. Negative for depression. The patient is not nervous/anxious and does not have insomnia.   All other systems reviewed and are negative.   Past Medical History:  Diagnosis Date  . Abnormal nuclear stress test 06/21/2011   Inferolateral reversible defect;--> cardiac cath & PCI of CxOM, occluded RCA with collaterals;; followup Myoview 11/2012: Low risk. Fixed basal inferior artifact normal EF. No ischemia  . Anxiety    When necessary Xanax  . Bilateral cataracts    Status post stroke or correction  . CAD S/P percutaneous coronary angioplasty 06/2011   s/P PCI to proximal OM1 w/ DES; occluded RCA with bridging and L-R collaterals (medical management)  . Chronic kidney disease (CKD), stage II (mild)    Related to current bladder infections (although diabetes cannot be excluded)  . Chronotropic incompetence with sinus node dysfunction [facility]) October 2013   On CPET test; beta blockers reduced  . Diabetes mellitus type 2, controlled ([place])    On oral medications  . Diverticulitis   . Essential hypertension    Allowing for permissive hypertension to avoid orthostatic hypotension  . GERD (gastroesophageal reflux disease)    On PPI  . Glaucoma   . History of syncope  Per EP - neurocardiogenic & not Bradycardia related (no PPM)  . History of unstable angina 06/13/2011   Jaw pain awakening from sleep -- Myoview --CATH --> PCI  . Hyperlipidemia with target LDL less than 70    HDL at goal, LDL not at goal, borderline triglycerides. On Crestor 20 mg  . Migraines   . OSA (obstructive sleep  apnea)    hx bladder infections  . Osteoarthritis   . PAF (paroxysmal atrial fibrillation) ([place]) 10-11/ 2014   CardioNet Event Monitor: NSR & S Brady -- Rates 50-100; Total A. fib burden 35 hours and 27 minutes. 1296 episodes, longest was 1 hour 29 minutes. Rate ranged from 52-169 beats per minute.  . Seasonal allergies   . Shortness of breath on exertion October 2013   2-D echo: Normal EF>55%, Gr 1DD, mild aortic sclerosis; Evaluated with CPET - peak VO2 97%; Chronotropic Incompetence (submaximal effort)  . Spinal stenosis of lumbar region 11/2011  . Stroke ([place])   . Tachycardia-bradycardia syndrome ([place]) 11/2012  . Urge incontinence     Past Surgical History:  Procedure Laterality Date  . APPENDECTOMY    . BREAST BIOPSY     both breast  . cataracts     both eyes  . COLONOSCOPY    . CORONARY ANGIOPLASTY WITH STENT PLACEMENT  07/05/2011   Promus Element DES 2.5 mm x 16 mm-post dilated to 2.65 mm CX-Proximal OM1  . Holter Monitor  04/2015   Sinus rhythm with rates 52-149 BPM. Isolated PACs with rare couplets and bigeminy. Multiple short runs of PAT/PSVT. Arrhythmia run 120 bpm for 204 beats. 14% of time was in A. fib/flutter. - Reviewed by Dr. Caryl Boyd. Not thought to be significant enough for her syncope.  Marland Kitchen KNEE ARTHROSCOPY WITH MEDIAL MENISECTOMY Right 06/24/2014   Procedure: RIGHT KNEE ARTHROSCOPY WITH partial lateral MENISECTOMY, abrasion chondroplasty of medial femoral condyle and patella, microfracture technique;  Surgeon: Latanya Maudlin, MD;  Location: [facility];  Service: Orthopedics;  Laterality: Right;  . LEFT HEART CATHETERIZATION WITH CORONARY ANGIOGRAM N/A 07/06/2011   Procedure: LEFT HEART CATHETERIZATION WITH CORONARY ANGIOGRAM;  Surgeon: Leonie Man, MD;  [facility] CATH LAB: Unstable Angina --> Myoview wiht Inf-Lat Ischemia.  Proximal OM1 lesion --> PCI; 100% mid RCA with right to right and left to right bridging collaterals from circumflex RPL and LAD the PDA.  Marland Kitchen NM  MYOVIEW LTD  October 2014    Low risk. Fixed basal inferior artifact normal EF. No ischemia --> (as compared to pre-PCI Myoview revealing inferolateral ischemia)  . TRANSTHORACIC ECHOCARDIOGRAM  11/2014   EF 65-70%, Mod LVH. Normal wall motion. Gr 1 DD. Normal valves.  Marland Kitchen VAGINAL HYSTERECTOMY      Current Meds  Medication Sig  . acetaminophen (TYLENOL) 650 MG CR tablet Take 1,300 mg by mouth 2 (two) times daily as needed for pain.  Marland Kitchen ALPRAZolam (XANAX) 0.25 MG tablet Take 1 tablet (0.25 mg total) by mouth at bedtime as needed. for sleep  . cholecalciferol (VITAMIN D) 1000 UNITS tablet Take 1,000 Units by mouth daily.  Marland Kitchen docusate sodium (STOOL SOFTENER) 100 MG capsule Take 100 mg by mouth daily.   . fish oil-omega-3 fatty acids 1000 MG capsule Take 1 g by mouth daily.   . furosemide (LASIX) 20 MG tablet TAKE 1 TABLET BY MOUTH EVERY DAY AS NEEDED THEN 2 TABLETS IF SWELLING (Patient taking differently: Take 40 mg by mouth daily. TAKE 1 TABLET BY MOUTH EVERY DAY AS NEEDED THEN 2 TABLETS  IF SWELLING)  . furosemide (LASIX) 20 MG tablet TAKE 1 TABLET BY MOUTH EVERY DAY AS NEEDED THEN 2 TABLETS IF SWELLING  . Insulin Glargine (LANTUS) 100 UNIT/ML Solostar Pen Inject 85 Units into the skin.   Marland Kitchen insulin lispro (HUMALOG KWIKPEN) 100 UNIT/ML KiwkPen Inject 22 Units into the skin 3 (three) times daily as needed (CBG >200).   Marland Kitchen ipratropium (ATROVENT) 0.06 % nasal spray Place 1 spray into the nose daily.   Marland Kitchen latanoprost (XALATAN) 0.005 % ophthalmic solution Instill one drop into each eye every night at bedtime  . levocetirizine (XYZAL) 5 MG tablet Take 5 mg by mouth every evening.   . loratadine (CLARITIN) 10 MG tablet Take 10 mg by mouth daily.  Marland Kitchen LUMIGAN 0.01 % SOLN Place 1 drop into both eyes at bedtime.   . Magnesium 500 MG CAPS Take 1,000 mg by mouth daily as needed (constipation).   . metoprolol tartrate (LOPRESSOR) 25 MG tablet Take 1 tablet by mouth twice daily. HOLD if systolic BP is under 235 and  HR under 60  . mirabegron ER (MYRBETRIQ) 50 MG TB24 tablet Take one tablet by mouth once daily for kidneys  . nitroGLYCERIN (NITROSTAT) 0.4 MG SL tablet Place 0.4 mg under the tongue every 5 (five) minutes as needed for chest pain.  Marland Kitchen nystatin cream (MYCOSTATIN) Apply 1 application topically daily.  . ONE TOUCH ULTRA TEST test strip 1 each by Other route as needed.   . pantoprazole (PROTONIX) 40 MG tablet Take 1 tablet (40 mg total) by mouth daily.  Marland Kitchen PARoxetine (PAXIL) 10 MG tablet Take 1 tablet (10 mg total) by mouth daily.  . Rivaroxaban (XARELTO) 15 MG TABS tablet Take 1 tablet (15 mg total) by mouth daily with supper.  Marland Kitchen rOPINIRole (REQUIP) 0.5 MG tablet TAKE 1 TABLET BY MOUTH AT  BEDTIME  . rosuvastatin (CRESTOR) 40 MG tablet TAKE 1 TABLET BY MOUTH  DAILY  . sitaGLIPtin (JANUVIA) 50 MG tablet Take 50 mg by mouth at bedtime.   . VESICARE 5 MG tablet TAKE 1 TABLET (5 MG TOTAL) BY MOUTH ONCE DAILY.    Allergies  Allergen Reactions  . Lisinopril Cough  . Losartan Cough  . Sulfa Antibiotics Hives    Social History   Social History  . Marital status: Widowed    Spouse name: N/A  . Number of children: N/A  . Years of education: N/A   Occupational History  . retired    Social History Main Topics  . Smoking status: Never Smoker  . Smokeless tobacco: Never Used  . Alcohol use No  . Drug use: No  . Sexual activity: No   Other Topics Concern  . None   Social History Narrative   Widowed mother of one, grandmother 2. Has not been using silver take as much lately due to knee discomfort. Usually exercises with Silver Sneakers on regular basis.    family history includes Breast cancer in her sister; COPD in her mother; Cancer in her sister and sister; Clotting disorder in her sister; Heart attack in her father and sister; Heart disease in her father; Pulmonary fibrosis in her mother; Stroke in her father.  Wt Readings from Last 3 Encounters:  06/14/16 193 lb (87.5 kg)  06/06/16  195 lb 2 oz (88.5 kg)  02/23/16 190 lb 1.9 oz (86.2 kg)    PHYSICAL EXAM BP 138/70   Pulse 78   Ht 5\' 3"  (1.6 m)   Wt 193 lb (87.5 kg)  BMI 34.19 kg/m  General appearance: alert, cooperative, appears stated age, no distress and mildly obese. She is sitting in a wheelchair Neck: no adenopathy, no carotid bruit and no JVD Lungs: clear to auscultation bilaterally, normal percussion bilaterally and non-labored Heart: regular rate and rhythm, S1& S2 normal, no murmur, click, rub or gallop ; nondisplaced PMI Abdomen: soft, non-tender; bowel sounds normal; no masses,  no organomegaly; no HJR Extremities: extremities normal, atraumatic, no cyanosis, and edema trace Pulses: 2+ and symmetric;  Neurologic: Mental status: Alert& oriented x3 , thought content appropriate; she is a little bit slow in answering questions and has some memory issues.  Pleasant mood & affect    Adult ECG Report  Rate: 78 ;  Rhythm: normal sinus rhythm and Normal axis, intervals and durations;   Narrative Interpretation: Stable EKG   Other studies Reviewed: Additional studies/ records that were reviewed today include:  Recent Labs:    Lab Results  Component Value Date   CHOL 212 (H) 05/18/2015   HDL 54.20 05/18/2015   LDLCALC 98 12/27/2014   LDLDIRECT 106.0 05/18/2015   TRIG 225.0 (H) 05/18/2015   CHOLHDL 4 05/18/2015   Lab Results  Component Value Date   CREATININE 1.19 06/06/2016   BUN 26 (H) 06/06/2016   NA 138 06/06/2016   K 4.3 06/06/2016   CL 103 06/06/2016   CO2 27 06/06/2016    ASSESSMENT / PLAN: Problem List Items Addressed This Visit    Bilateral lower extremity edema (Chronic)    Mild edema without significant heart failure. She is on a standing dose of Lasix with keeps it at [place] pretty much. We talked about support stockings which she will look into (her daughter)      CAD-PCI OM1, 100% RCA (L-R colllaterals) (Chronic)    No further anginal symptoms. She is on a beta blocker and  Crestor. Not on aspirin or Plavix because she is on Xarelto.      Relevant Orders   EKG 12-Lead   Essential hypertension (Chronic)    I agree that this is a good blood pressure for her. At goal for her age. I would not be any more aggressive with treatment. The beta blocker is mostly for rate control and coronary disease.      Hyperlipidemia with target LDL less than 70 (Chronic)    On stable dose of Crestor. Labs are usually followed by PCP. I think she should be due to have them checked soon.      Long term current use of anticoagulant therapy (Chronic)    On stable dose of Xarelto with no bleeding issues.      Neurocardiogenic syncope (Chronic)    No further symptoms. Not really sure what the etiology truly was. Allowing for some mild permissive hypertension. Also agree with using a walker or wheelchair now. Ensure adequate hydration      Relevant Orders   EKG 12-Lead   PAF (paroxysmal atrial fibrillation) (HCC);  CHA2DS2-VASc Score =6 on 15 mg Xarelto (Chronic)    In sinus rhythm today. Using beta blocker for rate control and Xarelto for anticoagulation. No bleeding issues. At this point would only treat when necessary tachycardia spells as necessary. But for most part would continue beta blocker. Based on her syncope history a would not push too much.      Relevant Orders   EKG 12-Lead   Unstable angina - history of; resolved, status post PCI of the OM (Chronic)    No further episodes of  anginal chest pain. She is on a stable low-dose beta blocker along with when necessary nitroglycerin which she has not had use.      Relevant Orders   EKG 12-Lead      Current medicines are reviewed at length with the patient today. (+/- concerns) n/a The following changes have been made: n/a  Patient Instructions  NO CHANGES WITH CURRENT TREATMENT    Your physician wants you to follow-up in [place]. You will receive a reminder letter in the mail two months in  advance. If you don't receive a letter, please call our office to schedule the follow-up appointment.   If you need a refill on your cardiac medications before your next appointment, please call your pharmacy.    Studies Ordered:   Orders Placed This Encounter  Procedures  . EKG 12-Lead      Glenetta Hew, M.D., M.S. Interventional Cardiologist   Pager # [phone] Phone # [phone] [address] [place], [place] [address]

## 2016-06-16 ENCOUNTER — Encounter: Payer: Self-pay | Admitting: Cardiology

## 2016-06-16 NOTE — Assessment & Plan Note (Signed)
No further symptoms. Not really sure what the etiology truly was. Allowing for some mild permissive hypertension. Also agree with using a walker or wheelchair now. Ensure adequate hydration

## 2016-06-16 NOTE — Assessment & Plan Note (Signed)
Mild edema without significant heart failure. She is on a standing dose of Lasix with keeps it at [place] pretty much. We talked about support stockings which she will look into (her daughter)

## 2016-06-16 NOTE — Assessment & Plan Note (Signed)
On stable dose of Xarelto with no bleeding issues.

## 2016-06-16 NOTE — Assessment & Plan Note (Signed)
On stable dose of Crestor. Labs are usually followed by PCP. I think she should be due to have them checked soon.

## 2016-06-16 NOTE — Assessment & Plan Note (Signed)
I agree that this is a good blood pressure for her. At goal for her age. I would not be any more aggressive with treatment. The beta blocker is mostly for rate control and coronary disease.

## 2016-06-16 NOTE — Assessment & Plan Note (Signed)
No further episodes of anginal chest pain. She is on a stable low-dose beta blocker along with when necessary nitroglycerin which she has not had use.

## 2016-06-16 NOTE — Assessment & Plan Note (Signed)
In sinus rhythm today. Using beta blocker for rate control and Xarelto for anticoagulation. No bleeding issues. At this point would only treat when necessary tachycardia spells as necessary. But for most part would continue beta blocker. Based on her syncope history a would not push too much.

## 2016-06-16 NOTE — Assessment & Plan Note (Signed)
No further anginal symptoms. She is on a beta blocker and Crestor. Not on aspirin or Plavix because she is on Xarelto.

## 2016-06-19 DIAGNOSIS — K219 Gastro-esophageal reflux disease without esophagitis: Secondary | ICD-10-CM | POA: Diagnosis not present

## 2016-06-19 DIAGNOSIS — M1711 Unilateral primary osteoarthritis, right knee: Secondary | ICD-10-CM | POA: Diagnosis not present

## 2016-06-19 DIAGNOSIS — Z9181 History of falling: Secondary | ICD-10-CM | POA: Diagnosis not present

## 2016-06-19 DIAGNOSIS — Z7901 Long term (current) use of anticoagulants: Secondary | ICD-10-CM | POA: Diagnosis not present

## 2016-06-19 DIAGNOSIS — I252 Old myocardial infarction: Secondary | ICD-10-CM | POA: Diagnosis not present

## 2016-06-19 DIAGNOSIS — E119 Type 2 diabetes mellitus without complications: Secondary | ICD-10-CM | POA: Diagnosis not present

## 2016-06-19 DIAGNOSIS — M5116 Intervertebral disc disorders with radiculopathy, lumbar region: Secondary | ICD-10-CM | POA: Diagnosis not present

## 2016-06-19 DIAGNOSIS — E7800 Pure hypercholesterolemia, unspecified: Secondary | ICD-10-CM | POA: Diagnosis not present

## 2016-06-22 DIAGNOSIS — I252 Old myocardial infarction: Secondary | ICD-10-CM | POA: Diagnosis not present

## 2016-06-22 DIAGNOSIS — M5116 Intervertebral disc disorders with radiculopathy, lumbar region: Secondary | ICD-10-CM | POA: Diagnosis not present

## 2016-06-22 DIAGNOSIS — E7800 Pure hypercholesterolemia, unspecified: Secondary | ICD-10-CM | POA: Diagnosis not present

## 2016-06-22 DIAGNOSIS — Z9181 History of falling: Secondary | ICD-10-CM | POA: Diagnosis not present

## 2016-06-22 DIAGNOSIS — K219 Gastro-esophageal reflux disease without esophagitis: Secondary | ICD-10-CM | POA: Diagnosis not present

## 2016-06-22 DIAGNOSIS — Z7901 Long term (current) use of anticoagulants: Secondary | ICD-10-CM | POA: Diagnosis not present

## 2016-06-22 DIAGNOSIS — E119 Type 2 diabetes mellitus without complications: Secondary | ICD-10-CM | POA: Diagnosis not present

## 2016-06-22 DIAGNOSIS — M1711 Unilateral primary osteoarthritis, right knee: Secondary | ICD-10-CM | POA: Diagnosis not present

## 2016-06-26 DIAGNOSIS — K219 Gastro-esophageal reflux disease without esophagitis: Secondary | ICD-10-CM | POA: Diagnosis not present

## 2016-06-26 DIAGNOSIS — E7800 Pure hypercholesterolemia, unspecified: Secondary | ICD-10-CM | POA: Diagnosis not present

## 2016-06-26 DIAGNOSIS — Z9181 History of falling: Secondary | ICD-10-CM | POA: Diagnosis not present

## 2016-06-26 DIAGNOSIS — I252 Old myocardial infarction: Secondary | ICD-10-CM | POA: Diagnosis not present

## 2016-06-26 DIAGNOSIS — Z7901 Long term (current) use of anticoagulants: Secondary | ICD-10-CM | POA: Diagnosis not present

## 2016-06-26 DIAGNOSIS — M1711 Unilateral primary osteoarthritis, right knee: Secondary | ICD-10-CM | POA: Diagnosis not present

## 2016-06-26 DIAGNOSIS — M5116 Intervertebral disc disorders with radiculopathy, lumbar region: Secondary | ICD-10-CM | POA: Diagnosis not present

## 2016-06-26 DIAGNOSIS — E119 Type 2 diabetes mellitus without complications: Secondary | ICD-10-CM | POA: Diagnosis not present

## 2016-06-28 DIAGNOSIS — K219 Gastro-esophageal reflux disease without esophagitis: Secondary | ICD-10-CM | POA: Diagnosis not present

## 2016-06-28 DIAGNOSIS — Z9181 History of falling: Secondary | ICD-10-CM | POA: Diagnosis not present

## 2016-06-28 DIAGNOSIS — I252 Old myocardial infarction: Secondary | ICD-10-CM | POA: Diagnosis not present

## 2016-06-28 DIAGNOSIS — E119 Type 2 diabetes mellitus without complications: Secondary | ICD-10-CM | POA: Diagnosis not present

## 2016-06-28 DIAGNOSIS — M1711 Unilateral primary osteoarthritis, right knee: Secondary | ICD-10-CM | POA: Diagnosis not present

## 2016-06-28 DIAGNOSIS — M5116 Intervertebral disc disorders with radiculopathy, lumbar region: Secondary | ICD-10-CM | POA: Diagnosis not present

## 2016-06-28 DIAGNOSIS — E7800 Pure hypercholesterolemia, unspecified: Secondary | ICD-10-CM | POA: Diagnosis not present

## 2016-06-28 DIAGNOSIS — Z7901 Long term (current) use of anticoagulants: Secondary | ICD-10-CM | POA: Diagnosis not present

## 2016-07-06 DIAGNOSIS — E7800 Pure hypercholesterolemia, unspecified: Secondary | ICD-10-CM | POA: Diagnosis not present

## 2016-07-06 DIAGNOSIS — Z9181 History of falling: Secondary | ICD-10-CM | POA: Diagnosis not present

## 2016-07-06 DIAGNOSIS — M5116 Intervertebral disc disorders with radiculopathy, lumbar region: Secondary | ICD-10-CM | POA: Diagnosis not present

## 2016-07-06 DIAGNOSIS — M1711 Unilateral primary osteoarthritis, right knee: Secondary | ICD-10-CM | POA: Diagnosis not present

## 2016-07-06 DIAGNOSIS — E119 Type 2 diabetes mellitus without complications: Secondary | ICD-10-CM | POA: Diagnosis not present

## 2016-07-06 DIAGNOSIS — K219 Gastro-esophageal reflux disease without esophagitis: Secondary | ICD-10-CM | POA: Diagnosis not present

## 2016-07-06 DIAGNOSIS — I252 Old myocardial infarction: Secondary | ICD-10-CM | POA: Diagnosis not present

## 2016-07-06 DIAGNOSIS — Z7901 Long term (current) use of anticoagulants: Secondary | ICD-10-CM | POA: Diagnosis not present

## 2016-07-07 DIAGNOSIS — K219 Gastro-esophageal reflux disease without esophagitis: Secondary | ICD-10-CM | POA: Diagnosis not present

## 2016-07-07 DIAGNOSIS — M5116 Intervertebral disc disorders with radiculopathy, lumbar region: Secondary | ICD-10-CM | POA: Diagnosis not present

## 2016-07-07 DIAGNOSIS — Z7901 Long term (current) use of anticoagulants: Secondary | ICD-10-CM | POA: Diagnosis not present

## 2016-07-07 DIAGNOSIS — E119 Type 2 diabetes mellitus without complications: Secondary | ICD-10-CM | POA: Diagnosis not present

## 2016-07-07 DIAGNOSIS — Z9181 History of falling: Secondary | ICD-10-CM | POA: Diagnosis not present

## 2016-07-07 DIAGNOSIS — I252 Old myocardial infarction: Secondary | ICD-10-CM | POA: Diagnosis not present

## 2016-07-07 DIAGNOSIS — M1711 Unilateral primary osteoarthritis, right knee: Secondary | ICD-10-CM | POA: Diagnosis not present

## 2016-07-07 DIAGNOSIS — E7800 Pure hypercholesterolemia, unspecified: Secondary | ICD-10-CM | POA: Diagnosis not present

## 2016-08-06 ENCOUNTER — Other Ambulatory Visit: Payer: Self-pay | Admitting: Cardiology

## 2016-08-16 DIAGNOSIS — H40053 Ocular hypertension, bilateral: Secondary | ICD-10-CM | POA: Diagnosis not present

## 2016-08-16 LAB — HM DIABETES EYE EXAM

## 2016-08-21 ENCOUNTER — Encounter: Payer: Self-pay | Admitting: Family Medicine

## 2016-08-21 DIAGNOSIS — G8929 Other chronic pain: Secondary | ICD-10-CM | POA: Diagnosis not present

## 2016-08-21 DIAGNOSIS — M25511 Pain in right shoulder: Secondary | ICD-10-CM | POA: Diagnosis not present

## 2016-08-21 DIAGNOSIS — M1711 Unilateral primary osteoarthritis, right knee: Secondary | ICD-10-CM | POA: Diagnosis not present

## 2016-08-26 ENCOUNTER — Other Ambulatory Visit: Payer: Self-pay | Admitting: Family Medicine

## 2016-09-06 DIAGNOSIS — E049 Nontoxic goiter, unspecified: Secondary | ICD-10-CM | POA: Diagnosis not present

## 2016-09-06 DIAGNOSIS — Z794 Long term (current) use of insulin: Secondary | ICD-10-CM | POA: Diagnosis not present

## 2016-09-06 DIAGNOSIS — N183 Chronic kidney disease, stage 3 (moderate): Secondary | ICD-10-CM | POA: Diagnosis not present

## 2016-09-06 DIAGNOSIS — E559 Vitamin D deficiency, unspecified: Secondary | ICD-10-CM | POA: Diagnosis not present

## 2016-09-06 DIAGNOSIS — E1122 Type 2 diabetes mellitus with diabetic chronic kidney disease: Secondary | ICD-10-CM | POA: Diagnosis not present

## 2016-09-06 DIAGNOSIS — E119 Type 2 diabetes mellitus without complications: Secondary | ICD-10-CM | POA: Diagnosis not present

## 2016-09-27 ENCOUNTER — Telehealth: Payer: Self-pay | Admitting: Family Medicine

## 2016-09-27 NOTE — Telephone Encounter (Signed)
Signed. Please complete the rest of the information and place at front for pickup. Thanks.

## 2016-09-27 NOTE — Telephone Encounter (Signed)
PT dropped off a handicapp form to be signed by Dr. Caryl Bis. Paper is up front in color folder.

## 2016-09-27 NOTE — Telephone Encounter (Signed)
In red folder. 

## 2016-09-28 NOTE — Telephone Encounter (Signed)
Left message to notify form is at front desk

## 2016-10-08 ENCOUNTER — Other Ambulatory Visit: Payer: Self-pay | Admitting: Cardiology

## 2016-10-29 ENCOUNTER — Other Ambulatory Visit: Payer: Self-pay | Admitting: Family Medicine

## 2016-11-12 ENCOUNTER — Other Ambulatory Visit: Payer: Self-pay | Admitting: Family Medicine

## 2016-11-13 NOTE — Telephone Encounter (Signed)
faxed

## 2016-11-13 NOTE — Telephone Encounter (Signed)
Last OV 4/18 and last fill 7/18 for 20 no refills ok to fill.

## 2016-11-13 NOTE — Telephone Encounter (Signed)
Please fax

## 2016-11-14 DIAGNOSIS — J300 Vasomotor rhinitis: Secondary | ICD-10-CM | POA: Diagnosis not present

## 2016-11-14 DIAGNOSIS — K219 Gastro-esophageal reflux disease without esophagitis: Secondary | ICD-10-CM | POA: Diagnosis not present

## 2016-11-15 ENCOUNTER — Other Ambulatory Visit: Payer: Self-pay | Admitting: Family Medicine

## 2016-11-28 DIAGNOSIS — M25561 Pain in right knee: Secondary | ICD-10-CM | POA: Diagnosis not present

## 2016-11-28 DIAGNOSIS — M1711 Unilateral primary osteoarthritis, right knee: Secondary | ICD-10-CM | POA: Diagnosis not present

## 2016-11-30 DIAGNOSIS — M25561 Pain in right knee: Secondary | ICD-10-CM | POA: Diagnosis not present

## 2016-11-30 DIAGNOSIS — M1711 Unilateral primary osteoarthritis, right knee: Secondary | ICD-10-CM | POA: Diagnosis not present

## 2016-12-12 ENCOUNTER — Encounter: Payer: Self-pay | Admitting: Family Medicine

## 2016-12-12 ENCOUNTER — Ambulatory Visit (INDEPENDENT_AMBULATORY_CARE_PROVIDER_SITE_OTHER): Payer: Medicare Other | Admitting: Family Medicine

## 2016-12-12 VITALS — BP 150/88 | HR 76 | Temp 97.5°F | Resp 18 | Wt 191.0 lb

## 2016-12-12 DIAGNOSIS — E1122 Type 2 diabetes mellitus with diabetic chronic kidney disease: Secondary | ICD-10-CM

## 2016-12-12 DIAGNOSIS — F419 Anxiety disorder, unspecified: Secondary | ICD-10-CM

## 2016-12-12 DIAGNOSIS — R0789 Other chest pain: Secondary | ICD-10-CM

## 2016-12-12 DIAGNOSIS — Z794 Long term (current) use of insulin: Secondary | ICD-10-CM | POA: Diagnosis not present

## 2016-12-12 DIAGNOSIS — G8929 Other chronic pain: Secondary | ICD-10-CM | POA: Diagnosis not present

## 2016-12-12 DIAGNOSIS — N182 Chronic kidney disease, stage 2 (mild): Secondary | ICD-10-CM

## 2016-12-12 DIAGNOSIS — M25561 Pain in right knee: Secondary | ICD-10-CM | POA: Diagnosis not present

## 2016-12-12 DIAGNOSIS — R300 Dysuria: Secondary | ICD-10-CM | POA: Diagnosis not present

## 2016-12-12 LAB — POCT URINALYSIS DIPSTICK
Bilirubin, UA: NEGATIVE
Glucose, UA: NEGATIVE
Ketones, UA: NEGATIVE
NITRITE UA: NEGATIVE
Spec Grav, UA: 1.01 (ref 1.010–1.025)
UROBILINOGEN UA: 0.2 U/dL
pH, UA: 6 (ref 5.0–8.0)

## 2016-12-12 MED ORDER — CEPHALEXIN 500 MG PO CAPS: 500.0000 mg | ORAL_CAPSULE | Freq: Two times a day (BID) | ORAL | 0 refills | Status: DC

## 2016-12-12 MED ORDER — NYSTATIN 100000 UNIT/GM EX CREA: 1.0000 "application " | TOPICAL_CREAM | Freq: Every day | CUTANEOUS | 1 refills | Status: DC

## 2016-12-12 MED ORDER — ALPRAZOLAM 0.25 MG PO TABS: ORAL_TABLET | ORAL | 1 refills | Status: DC

## 2016-12-12 NOTE — Assessment & Plan Note (Signed)
Check A1c. Determine regimen after that.

## 2016-12-12 NOTE — Assessment & Plan Note (Signed)
Stable. Has been tolerating xanax. No drowsiness. Refills given.

## 2016-12-12 NOTE — Progress Notes (Signed)
Tommi Rumps, MD Phone: [phone]  Abigail Boyd is a 83 y.o. female who presents today for follow-up.  Patient notes dysuria intermittently for several months. Notes it occurs. She is urinating and slightly afterwards. Has urgency and frequency. No abdominal pain. No fevers. No vaginal discharge. She does note occasionally she'll get cracks on her external labia. She saw gynecology previously for this and they prescribed nystatin cream. She notes this is very helpful.  She continues to have issues with her right knee. She had meniscal surgery previously though continues to have issues with not being a little walk on it due to pain. Only hurts her when she stands on it. She is seeking a second opinion as an MRI scheduled for next week. She is going to contact the orthopedic doctor to see if they will provide medication to help her relax while doing the MRI. If they will not the patient will contact us.  Diabetes: Following with endocrinology. Typically around 200. It was 300 this morning the 172 on last check. Taking Lantus 35 units in the morning and 40 units at night. Taking Humalog 22 units 3 times a day with meals. Taking Januvia as well. No hypoglycemia.  CAD: Has not had to use nitroglycerin. She does note a single episode of indigestion-like pain last night. Did occur after eating. Did not radiate. No shortness of breath. No diaphoresis. Resolved on its own. Asymptomatic currently. Follows with cardiology.  Anxiety: Patient notes anxiety. Difficult for her to settle her mind down at night. Takes Xanax with good benefit. No drowsiness.  PMH: nonsmoker.   ROS see history of present illness  Objective  Physical Exam Vitals:   12/12/16 1611  BP: (!) 150/88  Pulse: 76  Resp: 18  Temp: (!) 97.5 F (36.4 C)  SpO2: 96%    BP Readings from Last 3 Encounters:  12/12/16 (!) 150/88  06/14/16 138/70  06/06/16 (!) 148/74   Wt Readings from Last 3 Encounters:  12/12/16 191 lb  (86.6 kg)  06/14/16 193 lb (87.5 kg)  06/06/16 195 lb 2 oz (88.5 kg)    Physical Exam  Constitutional: No distress.  Cardiovascular: Normal rate, regular rhythm and normal heart sounds.   Pulmonary/Chest: Effort normal and breath sounds normal.  Abdominal: Soft. Bowel sounds are normal. She exhibits no distension. There is no tenderness.  Genitourinary:  Genitourinary Comments: Gluteal cleft observed with CMA in the room as patients daughter notes possible irritation, no irritation noted and no ulceration noted, external inguinal and labial area observed as well with minimally if any irritation  Musculoskeletal: She exhibits no edema.  Neurological: She is alert.  Skin: Skin is warm and dry. She is not diaphoretic.   EKG: Normal sinus rhythm, rate 75, no ST or T-wave changes  Assessment/Plan: Please see individual problem list.  Diabetes mellitus (HCC) Check A1c. Determine regimen after that.  Dysuria Concern for UTI. UA concerning for UTI. We will treat with Keflex and send for urine culture and microscopy. Nystatin for possible external irritation. Minimal external erythema if any noted. No masses noted.  Atypical chest pain Single episode of atypical chest pain. EKG reassuring. Likely GI related given it occurred after eating. Unlikely cardiac given stable EKG and atypical features. Discussed following up with cardiology as planned. If has recurrent GI related indigestion consider further treatment. Given return precautions.   Right knee pain Chronic issue. Has sought second opinion. MRI next week. They will discuss medication to help tolerate the MRI with orthopedics  and if none is provided we could consider filling this.   Anxiety Stable. Has been tolerating xanax. No drowsiness. Refills given.    Orders Placed This Encounter  Procedures  . Urine Culture  . Comp Met (CMET)  . HgB A1c  . Urine Microscopic Only  . POCT Urinalysis Dipstick  . EKG 12-Lead    Meds  ordered this encounter  Medications  . nystatin cream (MYCOSTATIN)    Sig: Apply 1 application topically daily.    Dispense:  30 g    Refill:  1  . cephALEXin (KEFLEX) 500 MG capsule    Sig: Take 1 capsule (500 mg total) by mouth 2 (two) times daily.    Dispense:  14 capsule    Refill:  0  . ALPRAZolam (XANAX) 0.25 MG tablet    Sig: TAKE 1 TABLET BY MOUTH AT BEDTIME AS NEEDED SLEEP    Dispense:  20 tablet    Refill:  1    Not to exceed 5 additional fills before 02/24/2017   Tommi Rumps, MD [place]

## 2016-12-12 NOTE — Assessment & Plan Note (Addendum)
Concern for UTI. UA concerning for UTI. We will treat with Keflex and send for urine culture and microscopy. Nystatin for possible external irritation. Minimal external erythema if any noted. No masses noted.

## 2016-12-12 NOTE — Patient Instructions (Signed)
Nice to see you. We will refill your nystatin. We will treat you for a UTI. We'll check lab work and contact you with the results. If you have persistent chest pain you need to be evaluated. Please keep your appointment with your cardiologist. Please keep the appointment for the MRI. If your orthopedic doctor will not provide medication to help you during the MRI please let us know.

## 2016-12-12 NOTE — Assessment & Plan Note (Signed)
Chronic issue. Has sought second opinion. MRI next week. They will discuss medication to help tolerate the MRI with orthopedics and if none is provided we could consider filling this.

## 2016-12-12 NOTE — Assessment & Plan Note (Signed)
Single episode of atypical chest pain. EKG reassuring. Likely GI related given it occurred after eating. Unlikely cardiac given stable EKG and atypical features. Discussed following up with cardiology as planned. If has recurrent GI related indigestion consider further treatment. Given return precautions.

## 2016-12-13 LAB — COMPREHENSIVE METABOLIC PANEL
ALBUMIN: 4.3 g/dL (ref 3.5–5.2)
ALT: 17 U/L (ref 0–35)
AST: 15 U/L (ref 0–37)
Alkaline Phosphatase: 64 U/L (ref 39–117)
BILIRUBIN TOTAL: 0.3 mg/dL (ref 0.2–1.2)
BUN: 27 mg/dL — ABNORMAL HIGH (ref 6–23)
CALCIUM: 10.1 mg/dL (ref 8.4–10.5)
CO2: 22 meq/L (ref 19–32)
CREATININE: 1.03 mg/dL (ref 0.40–1.20)
Chloride: 102 mEq/L (ref 96–112)
GFR: 54.11 mL/min — ABNORMAL LOW (ref >60.00)
Glucose, Bld: 136 mg/dL — ABNORMAL HIGH (ref 70–99)
Potassium: 4.5 mEq/L (ref 3.5–5.1)
Sodium: 138 mEq/L (ref 135–145)
Total Protein: 7.6 g/dL (ref 6.0–8.3)

## 2016-12-13 LAB — URINALYSIS, MICROSCOPIC ONLY

## 2016-12-13 LAB — HEMOGLOBIN A1C: HEMOGLOBIN A1C: 8.4 % — AB (ref 4.6–6.5)

## 2016-12-13 LAB — URINE CULTURE
MICRO NUMBER: 81153118
SPECIMEN QUALITY: ADEQUATE

## 2016-12-26 ENCOUNTER — Other Ambulatory Visit: Payer: Self-pay | Admitting: Cardiology

## 2016-12-26 ENCOUNTER — Other Ambulatory Visit: Payer: Self-pay | Admitting: Family Medicine

## 2017-01-03 DIAGNOSIS — M4726 Other spondylosis with radiculopathy, lumbar region: Secondary | ICD-10-CM | POA: Diagnosis not present

## 2017-01-03 DIAGNOSIS — M2241 Chondromalacia patellae, right knee: Secondary | ICD-10-CM | POA: Diagnosis not present

## 2017-01-03 DIAGNOSIS — M67961 Unspecified disorder of synovium and tendon, right lower leg: Secondary | ICD-10-CM | POA: Diagnosis not present

## 2017-01-03 DIAGNOSIS — M25461 Effusion, right knee: Secondary | ICD-10-CM | POA: Diagnosis not present

## 2017-01-03 DIAGNOSIS — M23321 Other meniscus derangements, posterior horn of medial meniscus, right knee: Secondary | ICD-10-CM | POA: Diagnosis not present

## 2017-01-03 DIAGNOSIS — M23331 Other meniscus derangements, other medial meniscus, right knee: Secondary | ICD-10-CM | POA: Diagnosis not present

## 2017-01-04 ENCOUNTER — Ambulatory Visit: Payer: Medicare Other | Admitting: Cardiology

## 2017-01-04 ENCOUNTER — Encounter: Payer: Self-pay | Admitting: Cardiology

## 2017-01-04 VITALS — BP 143/66 | HR 72 | Ht 63.0 in

## 2017-01-04 DIAGNOSIS — I2510 Atherosclerotic heart disease of native coronary artery without angina pectoris: Secondary | ICD-10-CM

## 2017-01-04 DIAGNOSIS — Z87898 Personal history of other specified conditions: Secondary | ICD-10-CM | POA: Diagnosis not present

## 2017-01-04 DIAGNOSIS — I480 Paroxysmal atrial fibrillation: Secondary | ICD-10-CM

## 2017-01-04 DIAGNOSIS — I495 Sick sinus syndrome: Secondary | ICD-10-CM | POA: Diagnosis not present

## 2017-01-04 DIAGNOSIS — Z9861 Coronary angioplasty status: Secondary | ICD-10-CM | POA: Diagnosis not present

## 2017-01-04 DIAGNOSIS — I951 Orthostatic hypotension: Secondary | ICD-10-CM | POA: Diagnosis not present

## 2017-01-04 DIAGNOSIS — I10 Essential (primary) hypertension: Secondary | ICD-10-CM | POA: Diagnosis not present

## 2017-01-04 NOTE — Progress Notes (Addendum)
PCP: Leone Haven, MD  Clinic Note: Chief Complaint  Patient presents with  . Follow-up    swelling in feet. always tired. has had some chest pain.   . Coronary Artery Disease    HPI: Abigail Boyd is a 83 y.o. female with a PMH below who presents today for delayed six-month follow-up for paroxysmal atrial fibrillation, with CAD-PCI and recurrent syncope. Abigail Boyd was last seen on June 14, 2016 -she was doing well.  Trace swelling.  No further syncope.  Increasing mobility.  Recent Hospitalizations: None  Studies Personally Reviewed - (if available, images/films reviewed: From Epic Chart or Care Everywhere)  None  Interval History: Abigail Boyd returns here today doing fairly well.  She now really has to be wheelchair-bound for the most part.  Her knee is really bothering her a lot.  The right knee is pretty much giving out on her whenever she does anything.  Besides her knee and her back hurting her so much, she seems to be okay from a cardiac standpoint.  She has had no further syncope or near syncopal type episodes.  No chest tightness or pressure with rest or the amount of exertion that she does.  Simply getting around the house is quite a bit of exertion and with this she denies any chest tightness or pressure.  She has a few skipping beats every now and then, but no rapid irregular heartbeats to suspect an arrhythmia. She has mild lower extremity edema but rarely has to use her additional dose of Lasix.  No PND or orthopnea.  No TIA/amaurosis fugax symptoms. No melena, hematochezia, hematuria, or epstaxis. No claudication.  ROS: A comprehensive was performed. Review of Systems  Constitutional: Negative for malaise/fatigue (Just tires out easily because it takes so much effort to do anything.  Not limited more by back and knee pain than anything).  HENT: Negative for congestion and nosebleeds.   Respiratory: Positive for shortness of breath (Baseline shortness of breath  no change).   Cardiovascular:       Per HPI  Gastrointestinal: Negative for abdominal pain, constipation and heartburn.  Genitourinary: Positive for frequency (More nocturnal). Negative for dysuria.  Musculoskeletal: Positive for back pain and joint pain (Right knee).  Neurological: Positive for dizziness (If she stands up too quickly). Negative for headaches.  Endo/Heme/Allergies: Positive for environmental allergies. Bruises/bleeds easily.  Psychiatric/Behavioral: Positive for memory loss. The patient is nervous/anxious.   All other systems reviewed and are negative.  I have reviewed and (if needed) personally updated the patient's problem list, medications, allergies, past medical and surgical history, social and family history.   Past Medical History:  Diagnosis Date  . Abnormal nuclear stress test 06/21/2011   Inferolateral reversible defect;--> cardiac cath & PCI of CxOM, occluded RCA with collaterals;; followup Myoview 11/2012: Low risk. Fixed basal inferior artifact normal EF. No ischemia  . Anxiety    When necessary Xanax  . Bilateral cataracts    Status post stroke or correction  . CAD S/P percutaneous coronary angioplasty 06/2011   s/P PCI to proximal OM1 w/ DES; occluded RCA with bridging and L-R collaterals (medical management)  . Chronic kidney disease (CKD), stage II (mild)    Related to current bladder infections (although diabetes cannot be excluded)  . Chronotropic incompetence with sinus node dysfunction [facility]) October 2013   On CPET test; beta blockers reduced  . Diabetes mellitus type 2, controlled ([place])    On oral medications  . Diverticulitis   .  Essential hypertension    Allowing for permissive hypertension to avoid orthostatic hypotension  . GERD (gastroesophageal reflux disease)    On PPI  . Glaucoma   . History of syncope    Per EP - neurocardiogenic & not Bradycardia related (no PPM)  . History of unstable angina 06/13/2011   Jaw pain awakening from  sleep -- Myoview --CATH --> PCI  . Hyperlipidemia with target LDL less than 70    HDL at goal, LDL not at goal, borderline triglycerides. On Crestor 20 mg  . Migraines   . OSA (obstructive sleep apnea)    hx bladder infections  . Osteoarthritis   . PAF (paroxysmal atrial fibrillation) ([place]) 10-11/ 2014   CardioNet Event Monitor: NSR & S Brady -- Rates 50-100; Total A. fib burden 35 hours and 27 minutes. 1296 episodes, longest was 1 hour 29 minutes. Rate ranged from 52-169 beats per minute.  . Seasonal allergies   . Shortness of breath on exertion October 2013   2-D echo: Normal EF>55%, Gr 1DD, mild aortic sclerosis; Evaluated with CPET - peak VO2 97%; Chronotropic Incompetence (submaximal effort)  . Spinal stenosis of lumbar region 11/2011  . Stroke ([place])   . Tachycardia-bradycardia syndrome ([place]) 11/2012  . Urge incontinence     Past Surgical History:  Procedure Laterality Date  . APPENDECTOMY    . BREAST BIOPSY     both breast  . cataracts     both eyes  . COLONOSCOPY    . CORONARY ANGIOPLASTY WITH STENT PLACEMENT  07/05/2011   Promus Element DES 2.5 mm x 16 mm-post dilated to 2.65 mm CX-Proximal OM1  . Holter Monitor  04/2015   Sinus rhythm with rates 52-149 BPM. Isolated PACs with rare couplets and bigeminy. Multiple short runs of PAT/PSVT. Arrhythmia run 120 bpm for 204 beats. 14% of time was in A. fib/flutter. - Reviewed by Dr. Caryl Comes. Not thought to be significant enough for her syncope.  Marland Kitchen NM MYOVIEW LTD  October 2014    Low risk. Fixed basal inferior artifact normal EF. No ischemia --> (as compared to pre-PCI Myoview revealing inferolateral ischemia)  . TRANSTHORACIC ECHOCARDIOGRAM  11/2014   EF 65-70%, Mod LVH. Normal wall motion. Gr 1 DD. Normal valves.  Marland Kitchen VAGINAL HYSTERECTOMY      Current Meds  Medication Sig  . acetaminophen (TYLENOL) 650 MG CR tablet Take 1,300 mg by mouth 2 (two) times daily as needed for pain.  Marland Kitchen ALPRAZolam (XANAX) 0.25 MG tablet TAKE 1 TABLET BY  MOUTH AT BEDTIME AS NEEDED SLEEP  . docusate sodium (STOOL SOFTENER) 100 MG capsule Take 100 mg by mouth daily.   . fish oil-omega-3 fatty acids 1000 MG capsule Take 1 g by mouth daily.   . furosemide (LASIX) 20 MG tablet TAKE 1 TABLET BY MOUTH EVERY DAY AS NEEDED THEN 2 TABLETS IF SWELLING (Patient taking differently: Take 40 mg by mouth daily. TAKE 1 TABLET BY MOUTH EVERY DAY AS NEEDED THEN 2 TABLETS IF SWELLING)  . gabapentin (NEURONTIN) 300 MG capsule gabapentin 300 mg capsule  take one cap po X 1 week, then take one cap po bid X 1 week, then take one cap po tid and continue  . Insulin Glargine (LANTUS) 100 UNIT/ML Solostar Pen Inject 85 Units into the skin.   Marland Kitchen insulin lispro (HUMALOG KWIKPEN) 100 UNIT/ML KiwkPen Inject 22 Units into the skin 3 (three) times daily as needed (CBG >200).   Marland Kitchen ipratropium (ATROVENT) 0.06 % nasal spray Place 1 spray  into the nose daily.   Marland Kitchen latanoprost (XALATAN) 0.005 % ophthalmic solution Instill one drop into each eye every night at bedtime  . levocetirizine (XYZAL) 5 MG tablet Take 5 mg by mouth every evening.   . loratadine (CLARITIN) 10 MG tablet Take 10 mg by mouth daily.  Marland Kitchen LUMIGAN 0.01 % SOLN Place 1 drop into both eyes at bedtime.   . Magnesium 500 MG CAPS Take 1,000 mg by mouth daily as needed (constipation).   . metoprolol tartrate (LOPRESSOR) 25 MG tablet TAKE 1 TABLET BY MOUTH  TWICE DAILY. HOLD IF  SYSTOLIC BP IS UNDER 409  AND HEART RATE UNDER 60  . mirabegron ER (MYRBETRIQ) 50 MG TB24 tablet Take one tablet by mouth once daily for kidneys  . nitroGLYCERIN (NITROSTAT) 0.4 MG SL tablet Place 0.4 mg under the tongue every 5 (five) minutes as needed for chest pain.  Marland Kitchen nystatin cream (MYCOSTATIN) Apply 1 application topically daily.  . ONE TOUCH ULTRA TEST test strip 1 each by Other route as needed.   . pantoprazole (PROTONIX) 40 MG tablet TAKE 1 TABLET BY MOUTH  DAILY  . PARoxetine (PAXIL) 10 MG tablet TAKE 1 TABLET BY MOUTH  DAILY  . rOPINIRole  (REQUIP) 0.5 MG tablet TAKE 1 TABLET BY MOUTH AT  BEDTIME  . rosuvastatin (CRESTOR) 40 MG tablet TAKE 1 TABLET BY MOUTH  DAILY  . sitaGLIPtin (JANUVIA) 50 MG tablet Take 50 mg by mouth at bedtime.   . VESICARE 5 MG tablet TAKE 1 TABLET (5 MG TOTAL) BY MOUTH ONCE DAILY.  Marland Kitchen XARELTO 15 MG TABS tablet TAKE 1 TABLET BY MOUTH  DAILY WITH SUPPER    Allergies  Allergen Reactions  . Amlodipine   . Clopidogrel Bisulfate   . Kenalog  [Triamcinolone Acetonide]   . Lisinopril Cough  . Losartan Cough  . Sulfa Antibiotics Hives and Rash    Social History   Socioeconomic History  . Marital status: Widowed    Spouse name: None  . Number of children: None  . Years of education: None  . Highest education level: None  Social Needs  . Financial resource strain: None  . Food insecurity - worry: None  . Food insecurity - inability: None  . Transportation needs - medical: None  . Transportation needs - non-medical: None  Occupational History  . Occupation: retired  Tobacco Use  . Smoking status: Never Smoker  . Smokeless tobacco: Never Used  Substance and Sexual Activity  . Alcohol use: No  . Drug use: No  . Sexual activity: No    Birth control/protection: Post-menopausal  Other Topics Concern  . None  Social History Narrative   Widowed mother of one, grandmother 2. Has not been using silver take as much lately due to knee discomfort. Usually exercises with Silver Sneakers on regular basis.    family history includes Arthritis in her unknown relative; Breast cancer in her sister; COPD in her mother; Cancer in her sister and sister; Clotting disorder in her sister; Heart attack in her father and sister; Heart disease in her father; Pulmonary fibrosis in her mother; Stroke in her father.  Wt Readings from Last 3 Encounters:  12/12/16 191 lb (86.6 kg)  06/14/16 193 lb (87.5 kg)  06/06/16 195 lb 2 oz (88.5 kg)    PHYSICAL EXAM BP (!) 143/66   Pulse 72   Ht 5\' 3"  (1.6 m)   SpO2 95%    BMI 33.83 kg/m  Physical Exam  Constitutional: She is  oriented to person, place, and time. She appears well-developed and well-nourished. No distress.  Very pleasant elderly woman sitting in a wheelchair.  With moving her right leg at all, she is in extreme pain.  HENT:  Head: Normocephalic and atraumatic.  Neck: No hepatojugular reflux and no JVD present. Carotid bruit is not present.  Cardiovascular: Normal rate, regular rhythm, normal heart sounds and normal pulses.  Occasional extrasystoles are present. PMI is not displaced. Exam reveals no gallop.  No murmur heard. Pulmonary/Chest: Effort normal and breath sounds normal. No respiratory distress. She has no wheezes. She has no rales.  Abdominal: Soft. Bowel sounds are normal. She exhibits no distension. There is no tenderness. There is no rebound.  Musculoskeletal: Normal range of motion. She exhibits edema (Trivial).  Neurological: She is alert and oriented to person, place, and time.  Skin: Skin is warm and dry. No rash noted. No erythema.  Psychiatric: She has a normal mood and affect. Her behavior is normal. Judgment and thought content normal.  Somewhat slowed speech.  Deliberate but no aphasia.  Nursing note and vitals reviewed.   Adult ECG Report Not checked  Other studies Reviewed: Additional studies/ records that were reviewed today include:  Recent Labs:   Lab Results  Component Value Date   CHOL 212 (H) 05/18/2015   HDL 54.20 05/18/2015   LDLCALC 98 12/27/2014   LDLDIRECT 106.0 05/18/2015   TRIG 225.0 (H) 05/18/2015   CHOLHDL 4 05/18/2015   Lab Results  Component Value Date   CREATININE 1.03 12/12/2016   BUN 27 (H) 12/12/2016   NA 138 12/12/2016   K 4.5 12/12/2016   CL 102 12/12/2016   CO2 22 12/12/2016    ASSESSMENT / PLAN: Problem List Items Addressed This Visit    CAD-PCI OM1, 100% RCA (L-R colllaterals) - Primary (Chronic)    She is on stable dose of beta-blocker and statin. No further anginal  symptoms. She is no longer on aspirin or Plavix since she is on Xarelto.      Essential hypertension (Chronic)    Blood pressure is well controlled with low dose Beta Blocker -- allowing for mild permissive HTN.      History of syncope: Unclear etiology (Chronic)    No recurrent episodes.  Anti-hypertensive agents.      Orthostatic hypotension (Chronic)    Permissive hypertension. Adequate hydration Additional blood pressure medications/vasodilators      PAF (paroxysmal atrial fibrillation) (HCC);  CHA2DS2-VASc Score =6 on 15 mg Xarelto (Chronic)    Remains in sinus rhythm today.  On Xarelto. Rate controlled with stable dose of beta-blocker.      Tachy-brady syndrome: Rates range from 50-169 bpm in A. fib (Chronic)    She has had any recurrent tachycardia or bradycardia episodes, on stable dose of beta-blocker.  No syncope or near syncope.           Current medicines are reviewed at length with the patient today. (+/- concerns) n/a The following changes have been made: n/a  Patient Instructions  NO CHANGES WITH CURRENT MEDICATIONS    Your physician wants you to follow-up in [place]. You will receive a reminder letter in the mail two months in advance. If you don't receive a letter, please call our office to schedule the follow-up appointment.    If you need a refill on your cardiac medications before your next appointment, please call your pharmacy.    Studies Ordered:   No orders of the defined  types were placed in this encounter.     Glenetta Hew, M.D., M.S. Interventional Cardiologist   Pager # [phone] Phone # [phone] [address] [place], [place] [address]

## 2017-01-04 NOTE — Patient Instructions (Signed)
NO CHANGES WITH CURRENT MEDICATIONS    Your physician wants you to follow-up in 6 MONTHS WITH DR HARDING.You will receive a reminder letter in the mail two months in advance. If you don't receive a letter, please call our office to schedule the follow-up appointment.    If you need a refill on your cardiac medications before your next appointment, please call your pharmacy.  

## 2017-01-06 ENCOUNTER — Encounter: Payer: Self-pay | Admitting: Cardiology

## 2017-01-07 NOTE — Assessment & Plan Note (Signed)
Blood pressure is well controlled with low dose Beta Blocker -- allowing for mild permissive HTN.

## 2017-01-07 NOTE — Assessment & Plan Note (Signed)
Permissive hypertension. Adequate hydration Additional blood pressure medications/vasodilators

## 2017-01-07 NOTE — Assessment & Plan Note (Signed)
No recurrent episodes.  Anti-hypertensive agents.

## 2017-01-07 NOTE — Assessment & Plan Note (Signed)
She is on stable dose of beta-blocker and statin. No further anginal symptoms. She is no longer on aspirin or Plavix since she is on Xarelto.

## 2017-01-07 NOTE — Assessment & Plan Note (Signed)
She has had any recurrent tachycardia or bradycardia episodes, on stable dose of beta-blocker.  No syncope or near syncope.

## 2017-01-07 NOTE — Assessment & Plan Note (Signed)
Remains in sinus rhythm today.  On Xarelto. Rate controlled with stable dose of beta-blocker.

## 2017-01-09 DIAGNOSIS — M1711 Unilateral primary osteoarthritis, right knee: Secondary | ICD-10-CM | POA: Diagnosis not present

## 2017-01-09 DIAGNOSIS — M4726 Other spondylosis with radiculopathy, lumbar region: Secondary | ICD-10-CM | POA: Diagnosis not present

## 2017-01-17 DIAGNOSIS — M25561 Pain in right knee: Secondary | ICD-10-CM | POA: Diagnosis not present

## 2017-01-20 ENCOUNTER — Emergency Department
Admission: EM | Admit: 2017-01-20 | Discharge: 2017-01-20 | Disposition: A | Discharge status: Home / Self Care | Payer: Medicare Other | Attending: Emergency Medicine | Admitting: Emergency Medicine

## 2017-01-20 ENCOUNTER — Encounter: Payer: Self-pay | Admitting: Emergency Medicine

## 2017-01-20 ENCOUNTER — Emergency Department: Payer: Medicare Other

## 2017-01-20 DIAGNOSIS — R4020 Unspecified coma: Secondary | ICD-10-CM | POA: Diagnosis not present

## 2017-01-20 DIAGNOSIS — Z955 Presence of coronary angioplasty implant and graft: Secondary | ICD-10-CM | POA: Diagnosis not present

## 2017-01-20 DIAGNOSIS — G8929 Other chronic pain: Secondary | ICD-10-CM | POA: Insufficient documentation

## 2017-01-20 DIAGNOSIS — Z794 Long term (current) use of insulin: Secondary | ICD-10-CM | POA: Diagnosis not present

## 2017-01-20 DIAGNOSIS — N182 Chronic kidney disease, stage 2 (mild): Secondary | ICD-10-CM | POA: Diagnosis not present

## 2017-01-20 DIAGNOSIS — Z7901 Long term (current) use of anticoagulants: Secondary | ICD-10-CM | POA: Insufficient documentation

## 2017-01-20 DIAGNOSIS — Z79899 Other long term (current) drug therapy: Secondary | ICD-10-CM | POA: Diagnosis not present

## 2017-01-20 DIAGNOSIS — I2510 Atherosclerotic heart disease of native coronary artery without angina pectoris: Secondary | ICD-10-CM | POA: Diagnosis not present

## 2017-01-20 DIAGNOSIS — M25561 Pain in right knee: Secondary | ICD-10-CM | POA: Diagnosis not present

## 2017-01-20 DIAGNOSIS — I129 Hypertensive chronic kidney disease with stage 1 through stage 4 chronic kidney disease, or unspecified chronic kidney disease: Secondary | ICD-10-CM | POA: Diagnosis not present

## 2017-01-20 DIAGNOSIS — R55 Syncope and collapse: Secondary | ICD-10-CM | POA: Diagnosis not present

## 2017-01-20 DIAGNOSIS — N3000 Acute cystitis without hematuria: Secondary | ICD-10-CM

## 2017-01-20 DIAGNOSIS — E1122 Type 2 diabetes mellitus with diabetic chronic kidney disease: Secondary | ICD-10-CM | POA: Diagnosis not present

## 2017-01-20 LAB — URINALYSIS, COMPLETE (UACMP) WITH MICROSCOPIC
BILIRUBIN URINE: NEGATIVE
Glucose, UA: 50 mg/dL — AB
HGB URINE DIPSTICK: NEGATIVE
Ketones, ur: NEGATIVE mg/dL
Nitrite: NEGATIVE
PROTEIN: NEGATIVE mg/dL
SPECIFIC GRAVITY, URINE: 1.012 (ref 1.005–1.030)
pH: 6 (ref 5.0–8.0)

## 2017-01-20 LAB — BASIC METABOLIC PANEL
ANION GAP: 11 (ref 5–15)
BUN: 34 mg/dL — AB (ref 6–20)
CHLORIDE: 100 mmol/L — AB (ref 101–111)
CO2: 26 mmol/L (ref 22–32)
Calcium: 9.4 mg/dL (ref 8.9–10.3)
Creatinine, Ser: 1.14 mg/dL — ABNORMAL HIGH (ref 0.44–1.00)
GFR calc Af Amer: 49 mL/min — ABNORMAL LOW (ref >60)
GFR calc non Af Amer: 43 mL/min — ABNORMAL LOW (ref >60)
GLUCOSE: 191 mg/dL — AB (ref 65–99)
POTASSIUM: 4 mmol/L (ref 3.5–5.1)
Sodium: 137 mmol/L (ref 135–145)

## 2017-01-20 LAB — CBC
HEMATOCRIT: 38.7 % (ref 35.0–47.0)
HEMOGLOBIN: 12.9 g/dL (ref 12.0–16.0)
MCH: 31.6 pg (ref 26.0–34.0)
MCHC: 33.4 g/dL (ref 32.0–36.0)
MCV: 94.6 fL (ref 80.0–100.0)
Platelets: 250 10*3/uL (ref 150–440)
RBC: 4.09 MIL/uL (ref 3.80–5.20)
RDW: 14.1 % (ref 11.5–14.5)
WBC: 9.7 10*3/uL (ref 3.6–11.0)

## 2017-01-20 LAB — GLUCOSE, CAPILLARY
GLUCOSE-CAPILLARY: 180 mg/dL — AB (ref 65–99)
Glucose-Capillary: 199 mg/dL — ABNORMAL HIGH (ref 65–99)

## 2017-01-20 LAB — TROPONIN I: Troponin I: <0.03 ng/mL (ref <0.03)

## 2017-01-20 MED ORDER — ACETAMINOPHEN 500 MG PO TABS
1000.0000 mg | ORAL_TABLET | Freq: Once | ORAL | Status: AC
  Administered 2017-01-20: 1000 mg via ORAL
  Filled 2017-01-20: qty 2

## 2017-01-20 MED ORDER — TRAMADOL HCL 50 MG PO TABS
50.0000 mg | ORAL_TABLET | Freq: Once | ORAL | Status: AC
  Administered 2017-01-20: 50 mg via ORAL
  Filled 2017-01-20: qty 1

## 2017-01-20 MED ORDER — CEPHALEXIN 500 MG PO CAPS
500.0000 mg | ORAL_CAPSULE | Freq: Once | ORAL | Status: AC
  Administered 2017-01-20: 500 mg via ORAL
  Filled 2017-01-20: qty 1

## 2017-01-20 MED ORDER — CEPHALEXIN 500 MG PO CAPS: 500.0000 mg | ORAL_CAPSULE | Freq: Three times a day (TID) | ORAL | 0 refills | Status: AC

## 2017-01-20 MED ORDER — CEFTRIAXONE SODIUM IN DEXTROSE 20 MG/ML IV SOLN
1.0000 g | Freq: Once | INTRAVENOUS | Status: AC
  Administered 2017-01-20: 1 g via INTRAVENOUS
  Filled 2017-01-20: qty 50

## 2017-01-20 MED ORDER — SODIUM CHLORIDE 0.9 % IV BOLUS (SEPSIS)
1000.0000 mL | Freq: Once | INTRAVENOUS | Status: AC
  Administered 2017-01-20: 1000 mL via INTRAVENOUS

## 2017-01-20 NOTE — ED Notes (Signed)
Ambulated patient with walker to toilet.  Patient able to ambulate with walker assistance.  Patient returned to bed.  Tolerated well.

## 2017-01-20 NOTE — ED Notes (Signed)
The patient was helped to the restroom. She was able to walk using a walker and assistance by me. Very slow to walk but denies any dizziness or light headedness. New brief placed on her also. Patient very thankful for the help.

## 2017-01-20 NOTE — ED Notes (Signed)
Report received from Opal Sidles - pt's daughter had left when she thought the pt was getting admitted and took a sleeping pill. Daughter will come to pick up mother when awakes. Pt resting comfortably and a lunch tray ordered.

## 2017-01-20 NOTE — ED Notes (Signed)
Pt went to CT

## 2017-01-20 NOTE — ED Triage Notes (Signed)
Pt presents to ED 25 via EMS from home c/o weakness and right knee; pt is awake, alert and oriented to self, situation but not to time. Pt is not able to tell me what year it is, or who the president is. Pt is able to tell me that it is November and we just Thanksgiving. Pt has history of A-Fib, diabetes, heart attack, and stroke.

## 2017-01-20 NOTE — ED Notes (Signed)
Called patient's daughter to inform her that patient will be discharged home.  Daughter will return to take patient home.

## 2017-01-20 NOTE — ED Notes (Signed)
Loreli Slot, daughter of the patient and Elsa, would like to be kept informed of the patient's condition and updates. Please call her at [phone], home [phone].

## 2017-01-20 NOTE — ED Notes (Signed)
Spoke with dietary to ensure pt is getting a lunch tray

## 2017-01-20 NOTE — ED Notes (Addendum)
Pt given lunch tray - blood sugar checked and documented. Juice in lunch substituted with diet cola

## 2017-01-20 NOTE — ED Provider Notes (Signed)
[facility] Emergency Department Provider Note  ____________________________________________  Time seen: Approximately 2:41 AM  I have reviewed the triage vital signs and the nursing notes.   HISTORY  Chief Complaint Weakness   HPI Abigail Boyd is a 83 y.o. female with h/o CAD, CKD, afib on Xarelto, DM on insulin, HTN who presents for near syncope. Patient reports that she was in her usual state of health this evening when she went to the bathroom to urinate. As she was walking out of the bathroom she started to have severe pain in her R knee and then started to feel dizzy like she was going to pass out. Her daughter assisted her to a chair and patient reports that she continue to feel lightheaded. When EMS arrived patient was neuro intact, normal vitals and normal BG. Patient is complaining of a mild diffuse HA since this episode started. NO head trauma or fall. NO CP, palpitations, SOB, fever, chills, abdominal pain, N/V, diarrhea, dysuria, back pain. Patient has h/o chronic knee pain with several surgeries in the past and has recently established care with pain clinic and has been started on tramadol. Last tramadol dose was 7PM  Past Medical History:  Diagnosis Date  . Abnormal nuclear stress test 06/21/2011   Inferolateral reversible defect;--> cardiac cath & PCI of CxOM, occluded RCA with collaterals;; followup Myoview 11/2012: Low risk. Fixed basal inferior artifact normal EF. No ischemia  . Anxiety    When necessary Xanax  . Bilateral cataracts    Status post stroke or correction  . CAD S/P percutaneous coronary angioplasty 06/2011   s/P PCI to proximal OM1 w/ DES; occluded RCA with bridging and L-R collaterals (medical management)  . Chronic kidney disease (CKD), stage II (mild)    Related to current bladder infections (although diabetes cannot be excluded)  . Chronotropic incompetence with sinus node dysfunction [facility]) October 2013   On CPET test;  beta blockers reduced  . Diabetes mellitus type 2, controlled ([place])    On oral medications  . Diverticulitis   . Essential hypertension    Allowing for permissive hypertension to avoid orthostatic hypotension  . GERD (gastroesophageal reflux disease)    On PPI  . Glaucoma   . History of syncope    Per EP - neurocardiogenic & not Bradycardia related (no PPM)  . History of unstable angina 06/13/2011   Jaw pain awakening from sleep -- Myoview --CATH --> PCI  . Hyperlipidemia with target LDL less than 70    HDL at goal, LDL not at goal, borderline triglycerides. On Crestor 20 mg  . Migraines   . OSA (obstructive sleep apnea)    hx bladder infections  . Osteoarthritis   . PAF (paroxysmal atrial fibrillation) ([place]) 10-11/ 2014   CardioNet Event Monitor: NSR & S Brady -- Rates 50-100; Total A. fib burden 35 hours and 27 minutes. 1296 episodes, longest was 1 hour 29 minutes. Rate ranged from 52-169 beats per minute.  . Seasonal allergies   . Shortness of breath on exertion October 2013   2-D echo: Normal EF>55%, Gr 1DD, mild aortic sclerosis; Evaluated with CPET - peak VO2 97%; Chronotropic Incompetence (submaximal effort)  . Spinal stenosis of lumbar region 11/2011  . Stroke ([place])   . Tachycardia-bradycardia syndrome ([place]) 11/2012  . Urge incontinence     Patient Active Problem List   Diagnosis Date Noted  . Dysuria 12/12/2016  . Atypical chest pain 12/12/2016  . Anxiety 12/13/2015  . Allergic rhinitis  12/13/2015  . Shortness of breath 11/18/2015  . UTI (urinary tract infection) 10/11/2015  . Orthostatic hypotension 05/30/2015  . History of cardioembolic cerebrovascular accident (CVA) 05/24/2015  . Syncope 04/23/2015  . Urge incontinence 03/15/2015  . Bilateral lower extremity edema 03/15/2015  . Constipation 03/15/2015  . Right knee pain 03/15/2015  . Hematochezia 03/15/2015  . CKD (chronic kidney disease), stage II   . Type 2 diabetes mellitus with chronic kidney disease,  with long-term current use of insulin ([place])   . Osteoporosis screening 06/10/2014  . PLMD (periodic limb movement disorder) 06/12/2013  . Obstructive sleep apnea 03/05/2013  . Tachy-brady syndrome: Rates range from 50-169 bpm in A. fib 12/20/2012  . History of syncope: Unclear etiology 12/19/2012  . Chronotropic incompetence - partially medication related 08/18/2012  . PAF (paroxysmal atrial fibrillation) (HCC);  CHA2DS2-VASc Score =6 on 15 mg Xarelto 05/16/2012    Class: Diagnosis of  . Long term current use of anticoagulant therapy 05/16/2012  . CAD-PCI OM1, 100% RCA (L-R colllaterals) 07/07/2011  . Hypercholesteremia     Class: Diagnosis of  . Essential hypertension     Class: Diagnosis of  . Spinal stenosis     Class: History of  . Diabetes mellitus (HCC)     Class: Diagnosis of  . Unstable angina - history of; resolved, status post PCI of the OM 06/13/2011    Class: History of  . Benign endometrial hyperplasia 03/21/2011  . Diverticulitis 03/21/2011  . GERD (gastroesophageal reflux disease) 03/21/2011  . Glaucoma (increased eye pressure) 03/21/2011  . Goiter 03/21/2011  . Hemorrhoid 03/21/2011  . History of anemia 03/21/2011  . History of carpal tunnel syndrome 03/21/2011  . Osteopenia 03/21/2011  . Vitamin D deficiency disease 03/21/2011    Past Surgical History:  Procedure Laterality Date  . APPENDECTOMY    . BREAST BIOPSY     both breast  . cataracts     both eyes  . COLONOSCOPY    . CORONARY ANGIOPLASTY WITH STENT PLACEMENT  07/05/2011   Promus Element DES 2.5 mm x 16 mm-post dilated to 2.65 mm CX-Proximal OM1  . Holter Monitor  04/2015   Sinus rhythm with rates 52-149 BPM. Isolated PACs with rare couplets and bigeminy. Multiple short runs of PAT/PSVT. Arrhythmia run 120 bpm for 204 beats. 14% of time was in A. fib/flutter. - Reviewed by Dr. Caryl Comes. Not thought to be significant enough for her syncope.  Marland Kitchen KNEE ARTHROSCOPY WITH MEDIAL MENISECTOMY Right 06/24/2014    Procedure: RIGHT KNEE ARTHROSCOPY WITH partial lateral MENISECTOMY, abrasion chondroplasty of medial femoral condyle and patella, microfracture technique;  Surgeon: Latanya Maudlin, MD;  Location: [facility];  Service: Orthopedics;  Laterality: Right;  . LEFT HEART CATHETERIZATION WITH CORONARY ANGIOGRAM N/A 07/06/2011   Procedure: LEFT HEART CATHETERIZATION WITH CORONARY ANGIOGRAM;  Surgeon: Leonie Man, MD;  Location: [facility] CATH LAB: Unstable Angina --> Myoview wiht Inf-Lat Ischemia.  Proximal OM1 lesion --> PCI; 100% mid RCA with right to right and left to right bridging collaterals from circumflex RPL and LAD the PDA.  Marland Kitchen NM MYOVIEW LTD  October 2014    Low risk. Fixed basal inferior artifact normal EF. No ischemia --> (as compared to pre-PCI Myoview revealing inferolateral ischemia)  . TRANSTHORACIC ECHOCARDIOGRAM  11/2014   EF 65-70%, Mod LVH. Normal wall motion. Gr 1 DD. Normal valves.  Marland Kitchen VAGINAL HYSTERECTOMY      Prior to Admission medications   Medication Sig Start Date End Date Taking? Authorizing Provider  traMADol (  ULTRAM) 50 MG tablet Take 50 mg by mouth 2 (two) times daily.   Yes [provider]  acetaminophen (TYLENOL) 650 MG CR tablet Take 1,300 mg by mouth 2 (two) times daily as needed for pain.    [provider]  ALPRAZolam Duanne Moron) 0.25 MG tablet TAKE 1 TABLET BY MOUTH AT BEDTIME AS NEEDED SLEEP 12/12/16   Leone Haven, MD  cephALEXin (KEFLEX) 500 MG capsule Take 1 capsule (500 mg total) by mouth 3 (three) times daily for 7 days. 01/20/17 01/27/17  Rudene Re, MD  docusate sodium (STOOL SOFTENER) 100 MG capsule Take 100 mg by mouth daily.     [provider]  fish oil-omega-3 fatty acids 1000 MG capsule Take 1 g by mouth daily.     [provider]  furosemide (LASIX) 20 MG tablet TAKE 1 TABLET BY MOUTH EVERY DAY AS NEEDED THEN 2 TABLETS IF SWELLING Patient taking differently: Take 40 mg by mouth daily. TAKE 1 TABLET BY MOUTH EVERY DAY AS  NEEDED THEN 2 TABLETS IF SWELLING 07/05/15   Leonie Man, MD  gabapentin (NEURONTIN) 300 MG capsule gabapentin 300 mg capsule  take one cap po X 1 week, then take one cap po bid X 1 week, then take one cap po tid and continue    [provider]  Insulin Glargine (LANTUS) 100 UNIT/ML Solostar Pen Inject 85 Units into the skin.  03/31/15   [provider]  insulin lispro (HUMALOG KWIKPEN) 100 UNIT/ML KiwkPen Inject 22 Units into the skin 3 (three) times daily as needed (CBG >200).     [provider]  ipratropium (ATROVENT) 0.06 % nasal spray Place 1 spray into the nose daily.  12/08/10   [provider]  latanoprost (XALATAN) 0.005 % ophthalmic solution Instill one drop into each eye every night at bedtime    [provider]  levocetirizine (XYZAL) 5 MG tablet Take 5 mg by mouth every evening.  03/22/15   [provider]  loratadine (CLARITIN) 10 MG tablet Take 10 mg by mouth daily.    [provider]  LUMIGAN 0.01 % SOLN Place 1 drop into both eyes at bedtime.  03/06/15   [provider]  Magnesium 500 MG CAPS Take 1,000 mg by mouth daily as needed (constipation).     [provider]  metoprolol tartrate (LOPRESSOR) 25 MG tablet TAKE 1 TABLET BY MOUTH  TWICE DAILY. HOLD IF  SYSTOLIC BP IS UNDER 308  AND HEART RATE UNDER 60 12/26/16   Leonie Man, MD  mirabegron ER (MYRBETRIQ) 50 MG TB24 tablet Take one tablet by mouth once daily for kidneys    [provider]  nitroGLYCERIN (NITROSTAT) 0.4 MG SL tablet Place 0.4 mg under the tongue every 5 (five) minutes as needed for chest pain.    [provider]  nystatin cream (MYCOSTATIN) Apply 1 application topically daily. 12/12/16   Leone Haven, MD  ONE TOUCH ULTRA TEST test strip 1 each by Other route as needed.  03/18/15   [provider]  pantoprazole (PROTONIX) 40 MG tablet TAKE 1 TABLET BY MOUTH  DAILY 10/31/16   Leone Haven, MD    PARoxetine (PAXIL) 10 MG tablet TAKE 1 TABLET BY MOUTH  DAILY 11/15/16   Leone Haven, MD  rOPINIRole (REQUIP) 0.5 MG tablet TAKE 1 TABLET BY MOUTH AT  BEDTIME 12/26/16   Leone Haven, MD  rosuvastatin (CRESTOR) 40 MG tablet TAKE 1 TABLET BY MOUTH  DAILY 12/26/16   Leonie Man, MD  sitaGLIPtin (JANUVIA) 50 MG tablet Take 50 mg by mouth at bedtime.     [provider]  VESICARE 5 MG tablet TAKE 1 TABLET (5 MG TOTAL) BY MOUTH ONCE DAILY. 03/30/15   [provider]  XARELTO 15 MG TABS tablet TAKE 1 TABLET BY MOUTH  DAILY WITH SUPPER 10/09/16   Leonie Man, MD    Allergies Amlodipine; Clopidogrel bisulfate; Kenalog  [triamcinolone acetonide]; Lisinopril; Losartan; and Sulfa antibiotics  Family History  Problem Relation Age of Onset  . COPD Mother   . Pulmonary fibrosis Mother   . Heart attack Father   . Heart disease Father   . Stroke Father   . Cancer Sister   . Cancer Sister   . Clotting disorder Sister   . Heart attack Sister   . Arthritis Unknown   . Breast cancer Sister   . Colon cancer Neg Hx   . Esophageal cancer Neg Hx   . Stomach cancer Neg Hx     Social History Social History   Tobacco Use  . Smoking status: Never Smoker  . Smokeless tobacco: Never Used  Substance Use Topics  . Alcohol use: No  . Drug use: No    Review of Systems  Constitutional: Negative for fever. + lightheadedness Eyes: Negative for visual changes. ENT: Negative for sore throat. Neck: No neck pain  Cardiovascular: Negative for chest pain. Respiratory: Negative for shortness of breath. Gastrointestinal: Negative for abdominal pain, vomiting or diarrhea. Genitourinary: Negative for dysuria. Musculoskeletal: Negative for back pain. Skin: Negative for rash. Neurological: Negative for weakness or numbness. +  headache Psych: No SI or HI  ____________________________________________   PHYSICAL EXAM:  VITAL SIGNS: ED Triage Vitals  Enc Vitals Group      BP 01/20/17 0231 (!) 151/66     Pulse Rate 01/20/17 0231 77     Resp 01/20/17 0231 17     Temp --      Temp src --      SpO2 01/20/17 0226 96 %     Weight 01/20/17 0231 191 lb 12.8 oz (87 kg)     Height 01/20/17 0231 5\' 4"  (1.626 m)     Head Circumference --      Peak Flow --      Pain Score --      Pain Loc --      Pain Edu? --      Excl. in [place]? --     Constitutional: Alert and oriented. Well appearing and in no apparent distress. HEENT:      Head: Normocephalic and atraumatic.         Eyes: Conjunctivae are normal. Sclera is non-icteric.       Mouth/Throat: Mucous membranes are dry.       Neck: Supple with no signs of meningismus. Cardiovascular: Regular rate and rhythm. No murmurs, gallops, or rubs. 2+ symmetrical distal pulses are present in all extremities. No JVD. Respiratory: Normal respiratory effort. Lungs are clear to auscultation bilaterally. No wheezes, crackles, or rhonchi.  Gastrointestinal: Soft, non tender, and non distended with positive bowel sounds. No rebound or guarding. Musculoskeletal: 1+ pitting edema on b/l LE. R knee has normal ROM, no swelling or erythema Neurologic: Normal speech and language. A & O x3, PERRL, EOMI, no nystagmus, CN II-XII intact, motor testing reveals good tone and bulk throughout. There is no evidence of pronator drift or dysmetria. Muscle strength is 5/5 on b/l UE  and LLE. 3/5 on RLE which per patient is chronic due to knee pain and arthritis. Sensory examination is intact.  Skin: Skin is warm, dry and intact. No rash noted. Psychiatric: Mood and affect are normal. Speech and behavior are normal.  ____________________________________________   LABS (all labs ordered are listed, but only abnormal results are displayed)  Labs Reviewed  BASIC METABOLIC PANEL - Abnormal; Notable for the following components:      Result Value   Chloride 100 (*)    Glucose, Bld 191 (*)    BUN 34 (*)    Creatinine, Ser 1.14 (*)    GFR calc non  Af Amer 43 (*)    GFR calc Af Amer 49 (*)    All other components within normal limits  URINALYSIS, COMPLETE (UACMP) WITH MICROSCOPIC - Abnormal; Notable for the following components:   Color, Urine YELLOW (*)    APPearance CLEAR (*)    Glucose, UA 50 (*)    Leukocytes, UA MODERATE (*)    Bacteria, UA MANY (*)    Squamous Epithelial / LPF 0-5 (*)    All other components within normal limits  GLUCOSE, CAPILLARY - Abnormal; Notable for the following components:   Glucose-Capillary 180 (*)    All other components within normal limits  URINE CULTURE  CBC  TROPONIN I  TROPONIN I  CBG MONITORING, ED   ____________________________________________  EKG  ED ECG REPORT I, Rudene Re, the attending physician, personally viewed and interpreted this ECG.  Normal sinus rhythm, rate of 76, first-degree AV block, normal QRS and QTc, normal axis, no ST elevations or depressions. Unchanged from prior from October 2008 ____________________________________________  RADIOLOGY  Head CT: No acute intracranial abnormalities. Diffuse cerebral atrophy and small vessel ischemic changes. ____________________________________________   PROCEDURES  Procedure(s) performed: None Procedures Critical Care performed:  None ____________________________________________   INITIAL IMPRESSION / ASSESSMENT AND PLAN / ED COURSE  83 y.o. female with h/o CAD, CKD, afib on Xarelto, DM on insulin, HTN who presents for near syncope. Patient continues to feel lightheaded and has a mild HA. Neuro intact but she is on Xarelto, therefore will do Head CT to rule out bleed. EKG with no evidence of ischemia or arrhythmia, will get troponin, and monitor on telemetry. Blood glucose 180.  Will check UA to rule out UTI, will check labs to rule out anemia, electrolyte abnormalities, dehydration. Will give IVF and tylenol for HA. Episode could also be due to pain or new tramadol.  Clinical Course as of Jan 20 718  Sat  Jan 20, 2017  0458 UA concerning for UTI. Patient will be given rocephin. 2nd troponin pending. Plan to ambulate after 2nd troponin and abx if patient is feeling improved she will be dc home  [CV]  [phone] Patient able to ambulate to and from toilet with no dizziness however complaining of R knee pain which is chronic for her. Will give tramadol and reassess.   [CV]    Clinical Course User Index [CV] Alfred Levins [place], MD   _________________________ 7:20 AM on 01/20/2017 -----------------------------------------  Patient feels improved, no longer feeling dizzy. Troponin x 2 negative. Received rocephin for UTI. Will be dc home on keflex and close f/u with PCPi Discussed return precautions with patient for signs of worsening infection including flank pain, abdominal pain, fever, nausea, vomiting.  As part of my medical decision making, I reviewed the following data within the [place] History obtained from family, Nursing notes reviewed and incorporated, Labs  reviewed , EKG interpreted , Old EKG reviewed, Old chart reviewed, Radiograph reviewed , Notes from prior ED visits and [place] Controlled Substance Database    Pertinent labs & imaging results that were available during my care of the patient were reviewed by me and considered in my medical decision making (see chart for details).    ____________________________________________   FINAL CLINICAL IMPRESSION(S) / ED DIAGNOSES  Final diagnoses:  Near syncope  Acute cystitis without hematuria  Chronic pain of right knee      NEW MEDICATIONS STARTED DURING THIS VISIT:  ED Discharge Orders        Ordered    cephALEXin (KEFLEX) 500 MG capsule  3 times daily     01/20/17 0719       Note:  This document was prepared using Dragon voice recognition software and may include unintentional dictation errors.    Rudene Re, MD 01/20/17 [phone]

## 2017-01-20 NOTE — ED Notes (Addendum)
Pt ambulated to toilet with assistance. Pt asking if we called her daughter. Daughter was called earlier by Network engineer at pt's request. dtr was still sleeping.

## 2017-01-22 LAB — URINE CULTURE

## 2017-01-23 DIAGNOSIS — K219 Gastro-esophageal reflux disease without esophagitis: Secondary | ICD-10-CM | POA: Diagnosis not present

## 2017-01-23 DIAGNOSIS — Z8673 Personal history of transient ischemic attack (TIA), and cerebral infarction without residual deficits: Secondary | ICD-10-CM | POA: Diagnosis not present

## 2017-01-23 DIAGNOSIS — M1711 Unilateral primary osteoarthritis, right knee: Secondary | ICD-10-CM | POA: Diagnosis not present

## 2017-01-23 DIAGNOSIS — M4726 Other spondylosis with radiculopathy, lumbar region: Secondary | ICD-10-CM | POA: Diagnosis not present

## 2017-01-23 DIAGNOSIS — I10 Essential (primary) hypertension: Secondary | ICD-10-CM | POA: Diagnosis not present

## 2017-01-23 DIAGNOSIS — E119 Type 2 diabetes mellitus without complications: Secondary | ICD-10-CM | POA: Diagnosis not present

## 2017-01-23 NOTE — Progress Notes (Signed)
ED CULTURE REPORT  83 yo female seen in ED on 11/24 with c/o weakness. UA in ED showed many bacteria with concern for UTI. Patient was given ceftriaxone in ED and discharged with cephalexin 500mg  TID x 7 days. Urine cultures reported on 11/27 showed E coli sensitive to cefazolin. Spoke with ED MD Dr. Corky Downs who agreed that no further interventions were needed at this time.   Results for orders placed or performed during the hospital encounter of 01/20/17  Urine Culture     Status: Abnormal   Collection Time: 01/20/17  2:51 AM  Result Value Ref Range Status   Specimen Description URINE, RANDOM  Final   Special Requests NONE  Final   Culture >=100,000 COLONIES/mL ESCHERICHIA COLI (A)  Final   Report Status 01/22/2017 FINAL  Final   Organism ID, Bacteria ESCHERICHIA COLI (A)  Final      Susceptibility   Escherichia coli - MIC*    AMPICILLIN <=2 SENSITIVE Sensitive     CEFAZOLIN <=4 SENSITIVE Sensitive     CEFTRIAXONE <=1 SENSITIVE Sensitive     CIPROFLOXACIN <=0.25 SENSITIVE Sensitive     GENTAMICIN <=1 SENSITIVE Sensitive     IMIPENEM <=0.25 SENSITIVE Sensitive     NITROFURANTOIN <=16 SENSITIVE Sensitive     TRIMETH/SULFA <=20 SENSITIVE Sensitive     AMPICILLIN/SULBACTAM <=2 SENSITIVE Sensitive     PIP/TAZO <=4 SENSITIVE Sensitive     Extended ESBL NEGATIVE Sensitive     * >=100,000 COLONIES/mL ESCHERICHIA COLI     Lendon Ka, PharmD Pharmacy Resident

## 2017-01-24 ENCOUNTER — Telehealth: Payer: Self-pay | Admitting: Family Medicine

## 2017-01-24 NOTE — Telephone Encounter (Signed)
Please advise 

## 2017-01-24 NOTE — Telephone Encounter (Signed)
She should be ok to take the xanax while taking tramadol, though she should not take them at the same time and should let us know if she gets excessively drowsy while taking these medications. Please confirm her tramadol dosing and see how often she is taking it. Thanks.

## 2017-01-24 NOTE — Telephone Encounter (Signed)
Copied from [place] [phone]. Topic: Quick Communication - See Telephone Encounter >> Jan 24, 2017  8:18 AM Aurelio Brash B wrote: CRM for notification. See Telephone encounter for:  Abigail Boyd  PT for [place]  [phone] left message for Dr Tharon Aquas nurse - Pt was seen in ER Friday and prescribed tramadol for her pain and was told not to take xanax wile taking the tramadol.  Pt has high anxiety level and wants to know if this is correct to hold the xanax  01/24/17.

## 2017-01-24 NOTE — Telephone Encounter (Signed)
Patients daughter says that patient is taking 50 mg BID of Tramadol. Advised that it would be ok to take xanax .25 in the evening but not at the same time as tramadol. Patients daughter gave verbal understanding to call with excessive drowsiness or any other new symptoms.

## 2017-01-25 DIAGNOSIS — I10 Essential (primary) hypertension: Secondary | ICD-10-CM | POA: Diagnosis not present

## 2017-01-25 DIAGNOSIS — M1711 Unilateral primary osteoarthritis, right knee: Secondary | ICD-10-CM | POA: Diagnosis not present

## 2017-01-25 DIAGNOSIS — K219 Gastro-esophageal reflux disease without esophagitis: Secondary | ICD-10-CM | POA: Diagnosis not present

## 2017-01-25 DIAGNOSIS — E119 Type 2 diabetes mellitus without complications: Secondary | ICD-10-CM | POA: Diagnosis not present

## 2017-01-25 DIAGNOSIS — M4726 Other spondylosis with radiculopathy, lumbar region: Secondary | ICD-10-CM | POA: Diagnosis not present

## 2017-01-25 DIAGNOSIS — Z8673 Personal history of transient ischemic attack (TIA), and cerebral infarction without residual deficits: Secondary | ICD-10-CM | POA: Diagnosis not present

## 2017-01-29 DIAGNOSIS — I10 Essential (primary) hypertension: Secondary | ICD-10-CM | POA: Diagnosis not present

## 2017-01-29 DIAGNOSIS — K219 Gastro-esophageal reflux disease without esophagitis: Secondary | ICD-10-CM | POA: Diagnosis not present

## 2017-01-29 DIAGNOSIS — Z8673 Personal history of transient ischemic attack (TIA), and cerebral infarction without residual deficits: Secondary | ICD-10-CM | POA: Diagnosis not present

## 2017-01-29 DIAGNOSIS — E119 Type 2 diabetes mellitus without complications: Secondary | ICD-10-CM | POA: Diagnosis not present

## 2017-01-29 DIAGNOSIS — M4726 Other spondylosis with radiculopathy, lumbar region: Secondary | ICD-10-CM | POA: Diagnosis not present

## 2017-01-29 DIAGNOSIS — M1711 Unilateral primary osteoarthritis, right knee: Secondary | ICD-10-CM | POA: Diagnosis not present

## 2017-01-31 DIAGNOSIS — E119 Type 2 diabetes mellitus without complications: Secondary | ICD-10-CM | POA: Diagnosis not present

## 2017-01-31 DIAGNOSIS — M4726 Other spondylosis with radiculopathy, lumbar region: Secondary | ICD-10-CM | POA: Diagnosis not present

## 2017-01-31 DIAGNOSIS — K219 Gastro-esophageal reflux disease without esophagitis: Secondary | ICD-10-CM | POA: Diagnosis not present

## 2017-01-31 DIAGNOSIS — M1711 Unilateral primary osteoarthritis, right knee: Secondary | ICD-10-CM | POA: Diagnosis not present

## 2017-01-31 DIAGNOSIS — I10 Essential (primary) hypertension: Secondary | ICD-10-CM | POA: Diagnosis not present

## 2017-01-31 DIAGNOSIS — Z8673 Personal history of transient ischemic attack (TIA), and cerebral infarction without residual deficits: Secondary | ICD-10-CM | POA: Diagnosis not present

## 2017-02-01 DIAGNOSIS — K219 Gastro-esophageal reflux disease without esophagitis: Secondary | ICD-10-CM | POA: Diagnosis not present

## 2017-02-01 DIAGNOSIS — M4726 Other spondylosis with radiculopathy, lumbar region: Secondary | ICD-10-CM | POA: Diagnosis not present

## 2017-02-01 DIAGNOSIS — Z8673 Personal history of transient ischemic attack (TIA), and cerebral infarction without residual deficits: Secondary | ICD-10-CM | POA: Diagnosis not present

## 2017-02-01 DIAGNOSIS — I10 Essential (primary) hypertension: Secondary | ICD-10-CM | POA: Diagnosis not present

## 2017-02-01 DIAGNOSIS — M1711 Unilateral primary osteoarthritis, right knee: Secondary | ICD-10-CM | POA: Diagnosis not present

## 2017-02-01 DIAGNOSIS — E119 Type 2 diabetes mellitus without complications: Secondary | ICD-10-CM | POA: Diagnosis not present

## 2017-02-07 DIAGNOSIS — M1711 Unilateral primary osteoarthritis, right knee: Secondary | ICD-10-CM | POA: Diagnosis not present

## 2017-02-07 DIAGNOSIS — K219 Gastro-esophageal reflux disease without esophagitis: Secondary | ICD-10-CM | POA: Diagnosis not present

## 2017-02-07 DIAGNOSIS — Z8673 Personal history of transient ischemic attack (TIA), and cerebral infarction without residual deficits: Secondary | ICD-10-CM | POA: Diagnosis not present

## 2017-02-07 DIAGNOSIS — E119 Type 2 diabetes mellitus without complications: Secondary | ICD-10-CM | POA: Diagnosis not present

## 2017-02-07 DIAGNOSIS — M4726 Other spondylosis with radiculopathy, lumbar region: Secondary | ICD-10-CM | POA: Diagnosis not present

## 2017-02-07 DIAGNOSIS — I10 Essential (primary) hypertension: Secondary | ICD-10-CM | POA: Diagnosis not present

## 2017-02-08 DIAGNOSIS — M1711 Unilateral primary osteoarthritis, right knee: Secondary | ICD-10-CM | POA: Diagnosis not present

## 2017-02-08 DIAGNOSIS — Z8673 Personal history of transient ischemic attack (TIA), and cerebral infarction without residual deficits: Secondary | ICD-10-CM | POA: Diagnosis not present

## 2017-02-08 DIAGNOSIS — E119 Type 2 diabetes mellitus without complications: Secondary | ICD-10-CM | POA: Diagnosis not present

## 2017-02-08 DIAGNOSIS — M4726 Other spondylosis with radiculopathy, lumbar region: Secondary | ICD-10-CM | POA: Diagnosis not present

## 2017-02-08 DIAGNOSIS — K219 Gastro-esophageal reflux disease without esophagitis: Secondary | ICD-10-CM | POA: Diagnosis not present

## 2017-02-08 DIAGNOSIS — I10 Essential (primary) hypertension: Secondary | ICD-10-CM | POA: Diagnosis not present

## 2017-02-09 DIAGNOSIS — Z8673 Personal history of transient ischemic attack (TIA), and cerebral infarction without residual deficits: Secondary | ICD-10-CM | POA: Diagnosis not present

## 2017-02-09 DIAGNOSIS — M4726 Other spondylosis with radiculopathy, lumbar region: Secondary | ICD-10-CM | POA: Diagnosis not present

## 2017-02-09 DIAGNOSIS — E119 Type 2 diabetes mellitus without complications: Secondary | ICD-10-CM | POA: Diagnosis not present

## 2017-02-09 DIAGNOSIS — K219 Gastro-esophageal reflux disease without esophagitis: Secondary | ICD-10-CM | POA: Diagnosis not present

## 2017-02-09 DIAGNOSIS — M1711 Unilateral primary osteoarthritis, right knee: Secondary | ICD-10-CM | POA: Diagnosis not present

## 2017-02-09 DIAGNOSIS — I10 Essential (primary) hypertension: Secondary | ICD-10-CM | POA: Diagnosis not present

## 2017-02-12 DIAGNOSIS — K219 Gastro-esophageal reflux disease without esophagitis: Secondary | ICD-10-CM | POA: Diagnosis not present

## 2017-02-12 DIAGNOSIS — M1711 Unilateral primary osteoarthritis, right knee: Secondary | ICD-10-CM | POA: Diagnosis not present

## 2017-02-12 DIAGNOSIS — M4726 Other spondylosis with radiculopathy, lumbar region: Secondary | ICD-10-CM | POA: Diagnosis not present

## 2017-02-12 DIAGNOSIS — Z8673 Personal history of transient ischemic attack (TIA), and cerebral infarction without residual deficits: Secondary | ICD-10-CM | POA: Diagnosis not present

## 2017-02-12 DIAGNOSIS — E119 Type 2 diabetes mellitus without complications: Secondary | ICD-10-CM | POA: Diagnosis not present

## 2017-02-12 DIAGNOSIS — I10 Essential (primary) hypertension: Secondary | ICD-10-CM | POA: Diagnosis not present

## 2017-02-13 DIAGNOSIS — H40053 Ocular hypertension, bilateral: Secondary | ICD-10-CM | POA: Diagnosis not present

## 2017-02-13 DIAGNOSIS — I10 Essential (primary) hypertension: Secondary | ICD-10-CM | POA: Diagnosis not present

## 2017-02-13 DIAGNOSIS — Z8673 Personal history of transient ischemic attack (TIA), and cerebral infarction without residual deficits: Secondary | ICD-10-CM | POA: Diagnosis not present

## 2017-02-13 DIAGNOSIS — K219 Gastro-esophageal reflux disease without esophagitis: Secondary | ICD-10-CM | POA: Diagnosis not present

## 2017-02-13 DIAGNOSIS — M4726 Other spondylosis with radiculopathy, lumbar region: Secondary | ICD-10-CM | POA: Diagnosis not present

## 2017-02-13 DIAGNOSIS — M1711 Unilateral primary osteoarthritis, right knee: Secondary | ICD-10-CM | POA: Diagnosis not present

## 2017-02-13 DIAGNOSIS — E119 Type 2 diabetes mellitus without complications: Secondary | ICD-10-CM | POA: Diagnosis not present

## 2017-02-14 DIAGNOSIS — M1711 Unilateral primary osteoarthritis, right knee: Secondary | ICD-10-CM | POA: Diagnosis not present

## 2017-02-14 DIAGNOSIS — Z8673 Personal history of transient ischemic attack (TIA), and cerebral infarction without residual deficits: Secondary | ICD-10-CM | POA: Diagnosis not present

## 2017-02-14 DIAGNOSIS — M4726 Other spondylosis with radiculopathy, lumbar region: Secondary | ICD-10-CM | POA: Diagnosis not present

## 2017-02-14 DIAGNOSIS — E119 Type 2 diabetes mellitus without complications: Secondary | ICD-10-CM | POA: Diagnosis not present

## 2017-02-14 DIAGNOSIS — K219 Gastro-esophageal reflux disease without esophagitis: Secondary | ICD-10-CM | POA: Diagnosis not present

## 2017-02-14 DIAGNOSIS — I10 Essential (primary) hypertension: Secondary | ICD-10-CM | POA: Diagnosis not present

## 2017-02-15 ENCOUNTER — Telehealth: Payer: Self-pay | Admitting: *Deleted

## 2017-02-15 DIAGNOSIS — M1711 Unilateral primary osteoarthritis, right knee: Secondary | ICD-10-CM | POA: Diagnosis not present

## 2017-02-15 DIAGNOSIS — Z8673 Personal history of transient ischemic attack (TIA), and cerebral infarction without residual deficits: Secondary | ICD-10-CM | POA: Diagnosis not present

## 2017-02-15 DIAGNOSIS — I10 Essential (primary) hypertension: Secondary | ICD-10-CM | POA: Diagnosis not present

## 2017-02-15 DIAGNOSIS — K219 Gastro-esophageal reflux disease without esophagitis: Secondary | ICD-10-CM | POA: Diagnosis not present

## 2017-02-15 DIAGNOSIS — E119 Type 2 diabetes mellitus without complications: Secondary | ICD-10-CM | POA: Diagnosis not present

## 2017-02-15 DIAGNOSIS — M4726 Other spondylosis with radiculopathy, lumbar region: Secondary | ICD-10-CM | POA: Diagnosis not present

## 2017-02-15 DIAGNOSIS — Z5181 Encounter for therapeutic drug level monitoring: Secondary | ICD-10-CM

## 2017-02-15 NOTE — Telephone Encounter (Signed)
Patients daughter states the patient has had some swelling in her feet, ankles and calf. She has added an additional fluid pill in the morning and it is not helping the swelling.Patient has had the increased swelling for two weeks now, please advise. [phone]

## 2017-02-15 NOTE — Telephone Encounter (Signed)
Please find out how long she has been doing the additional fluid pill and if the patient has any additional symptoms. Thanks.

## 2017-02-15 NOTE — Telephone Encounter (Signed)
Copied from [place] [phone]. Topic: Inquiry >> Feb 15, 2017 12:06 PM Patrice Paradise wrote: Reason for CRM: Patient's daughter Loreli Slot [phone] would like for Dr. Caryl Bis or his assistant to get her a call back. She have some concerns about the extra fluid around her mother ankles and feet.

## 2017-02-16 NOTE — Telephone Encounter (Signed)
Left message to ask daughter if patient is still taking an additional fluid pill, ok for pec to speak to daughter  Daughter states she is not having any other symptoms

## 2017-02-16 NOTE — Telephone Encounter (Signed)
Patient is scheduled for lab and daughter notified to have patient evaluated if she develops SOB please place lab orders

## 2017-02-16 NOTE — Telephone Encounter (Signed)
fyi

## 2017-02-16 NOTE — Telephone Encounter (Signed)
They should monitor and the patient will likely need lab work next week to make sure her kidneys have tolerated the increased dose. If she develops shortness of breath she should be evaluated. Thanks.

## 2017-02-16 NOTE — Telephone Encounter (Signed)
Daughter called and says that she has been giving her mother a fluid pill "on and off for 2 weeks now. She stated that it would depend if the pt was leaving home for an appt or not due to potential of incontinence. Daughter said that the edema is looking better.

## 2017-02-17 NOTE — Addendum Note (Signed)
Addended by: Caryl Bis, Kali Deadwyler G on: 02/17/2017 10:40 AM   Modules accepted: Orders

## 2017-02-17 NOTE — Telephone Encounter (Signed)
Ordered

## 2017-02-19 ENCOUNTER — Other Ambulatory Visit: Payer: Self-pay | Admitting: Family Medicine

## 2017-02-21 NOTE — Telephone Encounter (Signed)
Last OV 12/12/16 filed under historical

## 2017-02-21 NOTE — Telephone Encounter (Signed)
Please contact the patient to confirm dosing and see who she received this from previously.  Thanks.

## 2017-02-22 ENCOUNTER — Other Ambulatory Visit: Payer: Medicare Other

## 2017-02-22 ENCOUNTER — Ambulatory Visit: Payer: Medicare Other

## 2017-02-22 DIAGNOSIS — I10 Essential (primary) hypertension: Secondary | ICD-10-CM | POA: Diagnosis not present

## 2017-02-22 DIAGNOSIS — E119 Type 2 diabetes mellitus without complications: Secondary | ICD-10-CM | POA: Diagnosis not present

## 2017-02-22 DIAGNOSIS — M1711 Unilateral primary osteoarthritis, right knee: Secondary | ICD-10-CM | POA: Diagnosis not present

## 2017-02-22 DIAGNOSIS — M4726 Other spondylosis with radiculopathy, lumbar region: Secondary | ICD-10-CM | POA: Diagnosis not present

## 2017-02-22 DIAGNOSIS — Z8673 Personal history of transient ischemic attack (TIA), and cerebral infarction without residual deficits: Secondary | ICD-10-CM | POA: Diagnosis not present

## 2017-02-22 DIAGNOSIS — K219 Gastro-esophageal reflux disease without esophagitis: Secondary | ICD-10-CM | POA: Diagnosis not present

## 2017-02-23 DIAGNOSIS — I10 Essential (primary) hypertension: Secondary | ICD-10-CM | POA: Diagnosis not present

## 2017-02-23 DIAGNOSIS — M4726 Other spondylosis with radiculopathy, lumbar region: Secondary | ICD-10-CM | POA: Diagnosis not present

## 2017-02-23 DIAGNOSIS — Z8673 Personal history of transient ischemic attack (TIA), and cerebral infarction without residual deficits: Secondary | ICD-10-CM | POA: Diagnosis not present

## 2017-02-23 DIAGNOSIS — M1711 Unilateral primary osteoarthritis, right knee: Secondary | ICD-10-CM | POA: Diagnosis not present

## 2017-02-23 DIAGNOSIS — E119 Type 2 diabetes mellitus without complications: Secondary | ICD-10-CM | POA: Diagnosis not present

## 2017-02-23 DIAGNOSIS — K219 Gastro-esophageal reflux disease without esophagitis: Secondary | ICD-10-CM | POA: Diagnosis not present

## 2017-02-23 NOTE — Telephone Encounter (Signed)
Dr.Kalman already filled it

## 2017-02-28 DIAGNOSIS — M25561 Pain in right knee: Secondary | ICD-10-CM | POA: Diagnosis not present

## 2017-03-01 DIAGNOSIS — I10 Essential (primary) hypertension: Secondary | ICD-10-CM | POA: Diagnosis not present

## 2017-03-01 DIAGNOSIS — E119 Type 2 diabetes mellitus without complications: Secondary | ICD-10-CM | POA: Diagnosis not present

## 2017-03-01 DIAGNOSIS — K219 Gastro-esophageal reflux disease without esophagitis: Secondary | ICD-10-CM | POA: Diagnosis not present

## 2017-03-01 DIAGNOSIS — M1711 Unilateral primary osteoarthritis, right knee: Secondary | ICD-10-CM | POA: Diagnosis not present

## 2017-03-01 DIAGNOSIS — M4726 Other spondylosis with radiculopathy, lumbar region: Secondary | ICD-10-CM | POA: Diagnosis not present

## 2017-03-01 DIAGNOSIS — Z8673 Personal history of transient ischemic attack (TIA), and cerebral infarction without residual deficits: Secondary | ICD-10-CM | POA: Diagnosis not present

## 2017-03-05 ENCOUNTER — Telehealth: Payer: Self-pay | Admitting: Family Medicine

## 2017-03-05 DIAGNOSIS — M25561 Pain in right knee: Secondary | ICD-10-CM | POA: Diagnosis not present

## 2017-03-05 NOTE — Telephone Encounter (Signed)
Copied from [place] [phone]. Topic: Inquiry >> Mar 05, 2017  9:16 AM Malena Catholic I, NT wrote: Reason for CRM: physical therapy call to verbal  orders for one more visit for next  week mor more inf call mr Evan @ [phone]

## 2017-03-05 NOTE — Telephone Encounter (Signed)
Verbal order can be given.  

## 2017-03-05 NOTE — Telephone Encounter (Signed)
Done

## 2017-03-05 NOTE — Telephone Encounter (Signed)
Abigail Boyd called wanting a verbal to make up a day of physical therapy that was missed on Friday. Verbal given to do so.

## 2017-03-05 NOTE — Telephone Encounter (Signed)
Pt daughter will not be able to come with her mom for her appt but pt will have a caregiver with her. Daughter stated that pt is having some burning when she urinates or feels like she has to go and when she gets to the bathroom she cannot go or wets on herself before she gets there. Also she wanted pt to have a full work up on blood work not sure if she has had that in a while. Please advise? Thank you!

## 2017-03-06 ENCOUNTER — Ambulatory Visit (INDEPENDENT_AMBULATORY_CARE_PROVIDER_SITE_OTHER): Payer: Medicare Other | Admitting: Family Medicine

## 2017-03-06 ENCOUNTER — Other Ambulatory Visit (INDEPENDENT_AMBULATORY_CARE_PROVIDER_SITE_OTHER): Payer: Medicare Other

## 2017-03-06 ENCOUNTER — Other Ambulatory Visit: Payer: Self-pay

## 2017-03-06 ENCOUNTER — Ambulatory Visit: Payer: Medicare Other

## 2017-03-06 ENCOUNTER — Encounter: Payer: Self-pay | Admitting: Family Medicine

## 2017-03-06 VITALS — BP 136/72 | HR 77 | Temp 97.7°F | Wt 208.0 lb

## 2017-03-06 DIAGNOSIS — M4726 Other spondylosis with radiculopathy, lumbar region: Secondary | ICD-10-CM | POA: Diagnosis not present

## 2017-03-06 DIAGNOSIS — Z794 Long term (current) use of insulin: Secondary | ICD-10-CM

## 2017-03-06 DIAGNOSIS — R300 Dysuria: Secondary | ICD-10-CM

## 2017-03-06 DIAGNOSIS — Z5181 Encounter for therapeutic drug level monitoring: Secondary | ICD-10-CM

## 2017-03-06 DIAGNOSIS — N182 Chronic kidney disease, stage 2 (mild): Secondary | ICD-10-CM

## 2017-03-06 DIAGNOSIS — E1122 Type 2 diabetes mellitus with diabetic chronic kidney disease: Secondary | ICD-10-CM

## 2017-03-06 DIAGNOSIS — I10 Essential (primary) hypertension: Secondary | ICD-10-CM | POA: Diagnosis not present

## 2017-03-06 DIAGNOSIS — K219 Gastro-esophageal reflux disease without esophagitis: Secondary | ICD-10-CM | POA: Diagnosis not present

## 2017-03-06 DIAGNOSIS — E119 Type 2 diabetes mellitus without complications: Secondary | ICD-10-CM | POA: Diagnosis not present

## 2017-03-06 DIAGNOSIS — Z8673 Personal history of transient ischemic attack (TIA), and cerebral infarction without residual deficits: Secondary | ICD-10-CM | POA: Diagnosis not present

## 2017-03-06 DIAGNOSIS — M1711 Unilateral primary osteoarthritis, right knee: Secondary | ICD-10-CM | POA: Diagnosis not present

## 2017-03-06 LAB — POCT URINALYSIS DIPSTICK
GLUCOSE UA: NEGATIVE
Ketones, UA: NEGATIVE
Nitrite, UA: NEGATIVE
SPEC GRAV UA: 1.015 (ref 1.010–1.025)
Urobilinogen, UA: 0.2 E.U./dL
pH, UA: 6 (ref 5.0–8.0)

## 2017-03-06 LAB — BASIC METABOLIC PANEL
BUN: 33 mg/dL — AB (ref 6–23)
CALCIUM: 9.8 mg/dL (ref 8.4–10.5)
CO2: 27 mEq/L (ref 19–32)
Chloride: 100 mEq/L (ref 96–112)
Creatinine, Ser: 1.09 mg/dL (ref 0.40–1.20)
GFR: 50.66 mL/min — AB (ref >60.00)
GLUCOSE: 228 mg/dL — AB (ref 70–99)
Potassium: 4.4 mEq/L (ref 3.5–5.1)
Sodium: 136 mEq/L (ref 135–145)

## 2017-03-06 LAB — URINALYSIS, MICROSCOPIC ONLY: RBC / HPF: NONE SEEN (ref 0–?)

## 2017-03-06 MED ORDER — CEPHALEXIN 500 MG PO CAPS: 500.0000 mg | ORAL_CAPSULE | Freq: Two times a day (BID) | ORAL | 0 refills | Status: DC

## 2017-03-06 NOTE — Patient Instructions (Signed)
Nice to see you. We will send your urine for culture. We will treat you with Keflex. If your symptoms worsen please let us know.

## 2017-03-06 NOTE — Progress Notes (Signed)
  Tommi Rumps, MD Phone: [phone]  Abigail Boyd is a 84 y.o. female who presents today for same-day visit.  Patient notes onset of dysuria the day prior to this visit.  She notes some suprapubic discomfort particularly just prior to urinating.  When she urinates this does improve.  Notes frequency and urgency.  No discharge.  No nausea, vomiting, or diarrhea.  Afebrile.  Feels like a UTI to her.  Does note her CBGs have been more elevated than usual since she got a knee injection.  She notes up to 300s.  She is taking her glargine and lispro.  Social History   Tobacco Use  Smoking Status Never Smoker  Smokeless Tobacco Never Used     ROS see history of present illness  Objective  Physical Exam Vitals:   03/06/17 1501  BP: 136/72  Pulse: 77  Temp: 97.7 F (36.5 C)  SpO2: 96%    BP Readings from Last 3 Encounters:  03/06/17 136/72  01/20/17 (!) 158/76  01/04/17 (!) 143/66   Wt Readings from Last 3 Encounters:  03/06/17 208 lb (94.3 kg)  01/20/17 191 lb 12.8 oz (87 kg)  12/12/16 191 lb (86.6 kg)    Physical Exam  Constitutional: No distress.  Cardiovascular: Normal rate, regular rhythm and normal heart sounds.  Pulmonary/Chest: Effort normal and breath sounds normal.  Abdominal: Soft. Bowel sounds are normal. She exhibits no distension. There is no tenderness.  Done seated at patient request  Neurological: She is alert. Gait normal.  Skin: Skin is warm and dry. She is not diaphoretic.     Assessment/Plan: Please see individual problem list.  Dysuria Concerning for UTI.  UA concerning as well.  Will treat with Keflex.  Send urine for culture and microscopy.  Return precautions given.  Diabetes mellitus ([place]) Not quite due for an A1c.  CBGs likely elevated in setting of UTI as well as steroid injection in her knee.  She will monitor her CBGs and if they do not start to come down to let us know.  Plan to recheck in 6-8 weeks.   [name] was seen today for  dysuria.  Diagnoses and all orders for this visit:  Dysuria -     POCT Urinalysis Dipstick -     Urine Culture -     Urine Microscopic Only  Type 2 diabetes mellitus with stage 2 chronic kidney disease, with long-term current use of insulin (HCC)  Other orders -     cephALEXin (KEFLEX) 500 MG capsule; Take 1 capsule (500 mg total) by mouth 2 (two) times daily.    Orders Placed This Encounter  Procedures  . Urine Culture  . Urine Microscopic Only  . POCT Urinalysis Dipstick    Meds ordered this encounter  Medications  . cephALEXin (KEFLEX) 500 MG capsule    Sig: Take 1 capsule (500 mg total) by mouth 2 (two) times daily.    Dispense:  14 capsule    Refill:  0     Tommi Rumps, MD [place]

## 2017-03-06 NOTE — Progress Notes (Signed)
poc

## 2017-03-06 NOTE — Telephone Encounter (Signed)
Patient was seen for an office visit with PCP, concerns addressed.  Annual wellness visit to be completed at a later date.

## 2017-03-07 NOTE — Assessment & Plan Note (Signed)
Concerning for UTI.  UA concerning as well.  Will treat with Keflex.  Send urine for culture and microscopy.  Return precautions given.

## 2017-03-07 NOTE — Assessment & Plan Note (Signed)
Not quite due for an A1c.  CBGs likely elevated in setting of UTI as well as steroid injection in her knee.  She will monitor her CBGs and if they do not start to come down to let us know.  Plan to recheck in 6-8 weeks.

## 2017-03-08 DIAGNOSIS — Z8673 Personal history of transient ischemic attack (TIA), and cerebral infarction without residual deficits: Secondary | ICD-10-CM | POA: Diagnosis not present

## 2017-03-08 DIAGNOSIS — I10 Essential (primary) hypertension: Secondary | ICD-10-CM | POA: Diagnosis not present

## 2017-03-08 DIAGNOSIS — E119 Type 2 diabetes mellitus without complications: Secondary | ICD-10-CM | POA: Diagnosis not present

## 2017-03-08 DIAGNOSIS — M1711 Unilateral primary osteoarthritis, right knee: Secondary | ICD-10-CM | POA: Diagnosis not present

## 2017-03-08 DIAGNOSIS — K219 Gastro-esophageal reflux disease without esophagitis: Secondary | ICD-10-CM | POA: Diagnosis not present

## 2017-03-08 DIAGNOSIS — M4726 Other spondylosis with radiculopathy, lumbar region: Secondary | ICD-10-CM | POA: Diagnosis not present

## 2017-03-09 LAB — URINE CULTURE
MICRO NUMBER:: 90029074
SPECIMEN QUALITY:: ADEQUATE

## 2017-03-12 DIAGNOSIS — E119 Type 2 diabetes mellitus without complications: Secondary | ICD-10-CM | POA: Diagnosis not present

## 2017-03-12 DIAGNOSIS — M4726 Other spondylosis with radiculopathy, lumbar region: Secondary | ICD-10-CM | POA: Diagnosis not present

## 2017-03-12 DIAGNOSIS — Z8673 Personal history of transient ischemic attack (TIA), and cerebral infarction without residual deficits: Secondary | ICD-10-CM | POA: Diagnosis not present

## 2017-03-12 DIAGNOSIS — M1711 Unilateral primary osteoarthritis, right knee: Secondary | ICD-10-CM | POA: Diagnosis not present

## 2017-03-12 DIAGNOSIS — I10 Essential (primary) hypertension: Secondary | ICD-10-CM | POA: Diagnosis not present

## 2017-03-12 DIAGNOSIS — K219 Gastro-esophageal reflux disease without esophagitis: Secondary | ICD-10-CM | POA: Diagnosis not present

## 2017-03-13 ENCOUNTER — Other Ambulatory Visit: Payer: Self-pay | Admitting: Cardiology

## 2017-03-15 DIAGNOSIS — M4726 Other spondylosis with radiculopathy, lumbar region: Secondary | ICD-10-CM | POA: Diagnosis not present

## 2017-03-15 DIAGNOSIS — E119 Type 2 diabetes mellitus without complications: Secondary | ICD-10-CM | POA: Diagnosis not present

## 2017-03-15 DIAGNOSIS — K219 Gastro-esophageal reflux disease without esophagitis: Secondary | ICD-10-CM | POA: Diagnosis not present

## 2017-03-15 DIAGNOSIS — I10 Essential (primary) hypertension: Secondary | ICD-10-CM | POA: Diagnosis not present

## 2017-03-15 DIAGNOSIS — Z8673 Personal history of transient ischemic attack (TIA), and cerebral infarction without residual deficits: Secondary | ICD-10-CM | POA: Diagnosis not present

## 2017-03-15 DIAGNOSIS — M1711 Unilateral primary osteoarthritis, right knee: Secondary | ICD-10-CM | POA: Diagnosis not present

## 2017-03-20 DIAGNOSIS — I10 Essential (primary) hypertension: Secondary | ICD-10-CM | POA: Diagnosis not present

## 2017-03-20 DIAGNOSIS — K219 Gastro-esophageal reflux disease without esophagitis: Secondary | ICD-10-CM | POA: Diagnosis not present

## 2017-03-20 DIAGNOSIS — Z8673 Personal history of transient ischemic attack (TIA), and cerebral infarction without residual deficits: Secondary | ICD-10-CM | POA: Diagnosis not present

## 2017-03-20 DIAGNOSIS — M4726 Other spondylosis with radiculopathy, lumbar region: Secondary | ICD-10-CM | POA: Diagnosis not present

## 2017-03-20 DIAGNOSIS — M1711 Unilateral primary osteoarthritis, right knee: Secondary | ICD-10-CM | POA: Diagnosis not present

## 2017-03-20 DIAGNOSIS — E119 Type 2 diabetes mellitus without complications: Secondary | ICD-10-CM | POA: Diagnosis not present

## 2017-03-22 DIAGNOSIS — I10 Essential (primary) hypertension: Secondary | ICD-10-CM | POA: Diagnosis not present

## 2017-03-22 DIAGNOSIS — Z8673 Personal history of transient ischemic attack (TIA), and cerebral infarction without residual deficits: Secondary | ICD-10-CM | POA: Diagnosis not present

## 2017-03-22 DIAGNOSIS — M4726 Other spondylosis with radiculopathy, lumbar region: Secondary | ICD-10-CM | POA: Diagnosis not present

## 2017-03-22 DIAGNOSIS — K219 Gastro-esophageal reflux disease without esophagitis: Secondary | ICD-10-CM | POA: Diagnosis not present

## 2017-03-22 DIAGNOSIS — E119 Type 2 diabetes mellitus without complications: Secondary | ICD-10-CM | POA: Diagnosis not present

## 2017-03-22 DIAGNOSIS — M1711 Unilateral primary osteoarthritis, right knee: Secondary | ICD-10-CM | POA: Diagnosis not present

## 2017-04-03 ENCOUNTER — Other Ambulatory Visit: Payer: Self-pay | Admitting: Family Medicine

## 2017-04-05 ENCOUNTER — Other Ambulatory Visit: Payer: Self-pay

## 2017-04-05 MED ORDER — PANTOPRAZOLE SODIUM 40 MG PO TBEC: 40.0000 mg | DELAYED_RELEASE_TABLET | Freq: Every day | ORAL | 1 refills | Status: DC

## 2017-04-06 ENCOUNTER — Other Ambulatory Visit: Payer: Self-pay | Admitting: Family Medicine

## 2017-04-09 ENCOUNTER — Other Ambulatory Visit: Payer: Self-pay | Admitting: *Deleted

## 2017-04-09 MED ORDER — FUROSEMIDE 20 MG PO TABS: 40.0000 mg | ORAL_TABLET | Freq: Every day | ORAL | 3 refills | Status: DC

## 2017-04-09 NOTE — Telephone Encounter (Signed)
Spoke with pt daughter about her Furosemide pt take 2 20 mg(40 mg) tablet daily Rx has been sent to the pharmacy electronically.

## 2017-04-11 ENCOUNTER — Ambulatory Visit: Payer: Self-pay | Admitting: *Deleted

## 2017-04-11 NOTE — Telephone Encounter (Signed)
Left message for return call to discuss symptoms.

## 2017-04-11 NOTE — Telephone Encounter (Signed)
Mrs. Abigail Boyd daughter called to report her mother has frequency and urgency along with lower abdomen pressure. She stated mother was just treated for UTI in January. She denies flank pain, fever and blood in urine. She is asking if she can drop off a urine sample tomorrow? No appointments available at this time. Please advise.   Answer Assessment - Initial Assessment Questions 1. SYMPTOM: "What's the main symptom you're concerned about?" (e.g., frequency, incontinence)     Going to void every 15-30 minutes 2. ONSET: "When did the  ________  start?"    Symptoms started Monday, 3 days ago. 3. PAIN: "Is there any pain?" If so, ask: "How bad is it?" (Scale: 1-10; mild, moderate, severe)     Pressure at bladder, moderate 4. CAUSE: "What do you think is causing the symptoms?"    Possibly UTI 5. OTHER SYMPTOMS: "Do you have any other symptoms?" (e.g., fever, flank pain, blood in urine, pain with urination)   none 6. PREGNANCY: "Is there any chance you are pregnant?" "When was your last menstrual period?"   no  Protocols used: URINARY [facility]

## 2017-04-12 ENCOUNTER — Encounter: Payer: Self-pay | Admitting: Family Medicine

## 2017-04-12 ENCOUNTER — Ambulatory Visit: Payer: Medicare Other | Admitting: Family Medicine

## 2017-04-12 VITALS — BP 140/64 | HR 73 | Temp 97.4°F

## 2017-04-12 DIAGNOSIS — R300 Dysuria: Secondary | ICD-10-CM | POA: Diagnosis not present

## 2017-04-12 DIAGNOSIS — N3090 Cystitis, unspecified without hematuria: Secondary | ICD-10-CM

## 2017-04-12 LAB — POC URINALSYSI DIPSTICK (AUTOMATED)
Bilirubin, UA: NEGATIVE
Glucose, UA: NEGATIVE
Ketones, UA: NEGATIVE
NITRITE UA: NEGATIVE
PH UA: 6 (ref 5.0–8.0)
Spec Grav, UA: 1.02 (ref 1.010–1.025)
UROBILINOGEN UA: 0.2 U/dL

## 2017-04-12 LAB — URINALYSIS, MICROSCOPIC ONLY

## 2017-04-12 MED ORDER — CEPHALEXIN 500 MG PO CAPS: 500.0000 mg | ORAL_CAPSULE | Freq: Two times a day (BID) | ORAL | 0 refills | Status: DC

## 2017-04-12 NOTE — Patient Instructions (Signed)
Please complete antibiotic as directed. If you develop symptoms of fever >101, pain in your back, or do not improve with treatment that has been provided in 3 to 4 days, please follow up with your provider for further evaluation and treatment.  You will be contact about your referral.  Please let us know if you have not heard back within 1 week about your referral.   Urinary Tract Infection, Adult A urinary tract infection (UTI) is an infection of any part of the urinary tract. The urinary tract includes the:  Kidneys.  Ureters.  Bladder.  Urethra.  These organs make, store, and get rid of pee (urine) in the body. Follow these instructions at home:  Take over-the-counter and prescription medicines only as told by your doctor.  If you were prescribed an antibiotic medicine, take it as told by your doctor. Do not stop taking the antibiotic even if you start to feel better.  Avoid the following drinks: ? Alcohol. ? Caffeine. ? Tea. ? Carbonated drinks.  Drink enough fluid to keep your pee clear or pale yellow.  Keep all follow-up visits as told by your doctor. This is important.  Make sure to: ? Empty your bladder often and completely. Do not to hold pee for long periods of time. ? Empty your bladder before and after sex. ? Wipe from front to back after a bowel movement if you are female. Use each tissue one time when you wipe. Contact a doctor if:  You have back pain.  You have a fever.  You feel sick to your stomach (nauseous).  You throw up (vomit).  Your symptoms do not get better after 3 days.  Your symptoms go away and then come back. Get help right away if:  You have very bad back pain.  You have very bad lower belly (abdominal) pain.  You are throwing up and cannot keep down any medicines or water. This information is not intended to replace advice given to you by your health care provider. Make sure you discuss any questions you have with your health  care provider. Document Released: 08/02/2007 Document Revised: 07/22/2015 Document Reviewed: 01/04/2015 Elsevier Interactive Patient Education  [employer].

## 2017-04-12 NOTE — Addendum Note (Signed)
Addended by: Johna Sheriff on: 04/12/2017 02:38 PM   Modules accepted: Orders

## 2017-04-12 NOTE — Telephone Encounter (Signed)
Patient coming in at 1:15

## 2017-04-12 NOTE — Progress Notes (Signed)
Patient ID: Abigail Boyd, female   DOB: [birth year], 84 y.o.   MRN: [id number]      PCP: Leone Haven, MD  Subjective:  Abigail Boyd is a 84 y.o.  year old very pleasant female patient who presents with Urinary Tract symptoms: symptoms including dysuria and urgency. -started: 3 days ago, symptoms are not improving -previous treatments: No treatments for this episode. Recent UTI treatment 03/06/17; she reports completing antibiotic without adverse effects -  ROS-denies fever, chills, sweats, N/V, flank pain, or blood in urine  Pertinent Past Medical History- HTN,PAF, CKD stage II, DM, CAD,   Medications- reviewed  Current Outpatient Medications  Medication Sig Dispense Refill  . acetaminophen (TYLENOL) 650 MG CR tablet Take 1,300 mg by mouth 2 (two) times daily as needed for pain.    Marland Kitchen ALPRAZolam (XANAX) 0.25 MG tablet TAKE 1 TABLET BY MOUTH AT BEDTIME AS NEEDED SLEEP 20 tablet 1  . docusate sodium (STOOL SOFTENER) 100 MG capsule Take 100 mg by mouth daily.     . furosemide (LASIX) 20 MG tablet Take 2 tablets (40 mg total) by mouth daily. 180 tablet 3  . gabapentin (NEURONTIN) 300 MG capsule gabapentin 300 mg capsule  take one cap po X 1 week, then take one cap po bid X 1 week, then take one cap po tid and continue    . Insulin Glargine (LANTUS) 100 UNIT/ML Solostar Pen Inject 85 Units into the skin.     Marland Kitchen insulin lispro (HUMALOG KWIKPEN) 100 UNIT/ML KiwkPen Inject 22 Units into the skin 3 (three) times daily as needed (CBG >200).     Marland Kitchen ipratropium (ATROVENT) 0.06 % nasal spray Place 1 spray into the nose daily.     Marland Kitchen latanoprost (XALATAN) 0.005 % ophthalmic solution Instill one drop into each eye every night at bedtime    . levocetirizine (XYZAL) 5 MG tablet Take 5 mg by mouth every evening.     . loratadine (CLARITIN) 10 MG tablet Take 10 mg by mouth daily.    Marland Kitchen LUMIGAN 0.01 % SOLN Place 1 drop into both eyes at bedtime.     . Magnesium 500 MG CAPS Take 1,000 mg by mouth daily as  needed (constipation).     . metoprolol tartrate (LOPRESSOR) 25 MG tablet TAKE 1 TABLET BY MOUTH  TWICE DAILY. HOLD IF  SYSTOLIC BP IS UNDER 269  AND HEART RATE UNDER 60 180 tablet 1  . mirabegron ER (MYRBETRIQ) 50 MG TB24 tablet Take one tablet by mouth once daily for kidneys    . nitroGLYCERIN (NITROSTAT) 0.4 MG SL tablet Place 0.4 mg under the tongue every 5 (five) minutes as needed for chest pain.    Marland Kitchen nystatin cream (MYCOSTATIN) Apply 1 application topically daily. 30 g 1  . ONE TOUCH ULTRA TEST test strip 1 each by Other route as needed.     . pantoprazole (PROTONIX) 40 MG tablet Take 1 tablet (40 mg total) by mouth daily. 90 tablet 1  . PARoxetine (PAXIL) 10 MG tablet TAKE 1 TABLET BY MOUTH  DAILY 90 tablet 1  . rOPINIRole (REQUIP) 0.5 MG tablet TAKE 1 TABLET BY MOUTH AT  BEDTIME 90 tablet 1  . rosuvastatin (CRESTOR) 40 MG tablet TAKE 1 TABLET BY MOUTH  DAILY 90 tablet 1  . sitaGLIPtin (JANUVIA) 50 MG tablet Take 50 mg by mouth at bedtime.     . traMADol (ULTRAM) 50 MG tablet Take 50 mg by mouth 2 (two) times daily.    Marland Kitchen  VESICARE 5 MG tablet TAKE 1 TABLET (5 MG TOTAL) BY MOUTH ONCE DAILY.  11  . XARELTO 15 MG TABS tablet TAKE 1 TABLET BY MOUTH  DAILY WITH SUPPER 90 tablet 1  . cephALEXin (KEFLEX) 500 MG capsule Take 1 capsule (500 mg total) by mouth 2 (two) times daily. (Patient not taking: Reported on 04/12/2017) 14 capsule 0  . fish oil-omega-3 fatty acids 1000 MG capsule Take 1 g by mouth daily.      No current facility-administered medications for this visit.     Objective: BP 140/64 (BP Location: Left Arm, Patient Position: Sitting, Cuff Size: Large)   Pulse 73   Temp (!) 97.4 F (36.3 C) (Oral)   SpO2 97%  Gen: NAD, resting comfortably HEENT: oropharynx is clear and moist CV: Normal rate, regular rhythm, and normal heart sounds Lungs: CTAB no crackles, wheeze, rhonchi Abdomen: soft/nontender/nondistended/normal bowel sounds. No rebound or guarding.  No CVA tenderness.    Suprapubic tenderness not present Exam completed in wheelchair as patient requested.  Ext: no edema Skin: warm, dry, no rash Neuro: grossly normal, moves all extremities  Assessment/Plan: 1. Cystitis UA concerning for UTI; see below.  - Ambulatory referral to Urology  2. Dysuria  - POCT Urinalysis Dipstick (Automated)  UA indicates 3+leukocytes, +blood, and negative for nitrites. History and exam today are suggestive of UTI.  Will send for culture. Will treat with cephalexin today and refer to urology with history of multiple UTIs. Review of previous urine culture indicated sensitivity to cephalexin.Patient's PCP agreed with referral.  Advised patient to complete antibiotic and she should follow up if her symptoms do not improve in 2 to 3 days, worsen, she develops a fever >101, or back pain. Also advised her to increase her water intake. Patient voiced understanding and agreed with plan.   Finally, we reviewed reasons to return to care including if symptoms worsen or persist or new concerns arise- once again particularly fever, N/V, or flank pain.   Laurita Quint, FNP

## 2017-04-15 LAB — URINE CULTURE
MICRO NUMBER:: 90199901
SPECIMEN QUALITY:: ADEQUATE

## 2017-04-18 DIAGNOSIS — M25561 Pain in right knee: Secondary | ICD-10-CM | POA: Diagnosis not present

## 2017-04-18 DIAGNOSIS — G894 Chronic pain syndrome: Secondary | ICD-10-CM | POA: Diagnosis not present

## 2017-04-18 DIAGNOSIS — M1711 Unilateral primary osteoarthritis, right knee: Secondary | ICD-10-CM | POA: Diagnosis not present

## 2017-04-23 ENCOUNTER — Emergency Department: Payer: Medicare Other

## 2017-04-23 ENCOUNTER — Observation Stay: Payer: Medicare Other

## 2017-04-23 ENCOUNTER — Other Ambulatory Visit: Payer: Self-pay

## 2017-04-23 ENCOUNTER — Observation Stay
Admission: EM | Admit: 2017-04-23 | Discharge: 2017-04-25 | Disposition: A | Discharge status: Skilled Nursing Facility | Payer: Medicare Other | Attending: Internal Medicine | Admitting: Internal Medicine

## 2017-04-23 ENCOUNTER — Encounter: Payer: Self-pay | Admitting: Internal Medicine

## 2017-04-23 DIAGNOSIS — R262 Difficulty in walking, not elsewhere classified: Secondary | ICD-10-CM | POA: Insufficient documentation

## 2017-04-23 DIAGNOSIS — E1122 Type 2 diabetes mellitus with diabetic chronic kidney disease: Secondary | ICD-10-CM | POA: Insufficient documentation

## 2017-04-23 DIAGNOSIS — E119 Type 2 diabetes mellitus without complications: Secondary | ICD-10-CM | POA: Diagnosis not present

## 2017-04-23 DIAGNOSIS — Z8673 Personal history of transient ischemic attack (TIA), and cerebral infarction without residual deficits: Secondary | ICD-10-CM | POA: Diagnosis not present

## 2017-04-23 DIAGNOSIS — N390 Urinary tract infection, site not specified: Secondary | ICD-10-CM | POA: Diagnosis not present

## 2017-04-23 DIAGNOSIS — Z882 Allergy status to sulfonamides status: Secondary | ICD-10-CM | POA: Insufficient documentation

## 2017-04-23 DIAGNOSIS — R42 Dizziness and giddiness: Secondary | ICD-10-CM | POA: Diagnosis not present

## 2017-04-23 DIAGNOSIS — R2681 Unsteadiness on feet: Secondary | ICD-10-CM | POA: Insufficient documentation

## 2017-04-23 DIAGNOSIS — M6281 Muscle weakness (generalized): Secondary | ICD-10-CM | POA: Insufficient documentation

## 2017-04-23 DIAGNOSIS — Z79899 Other long term (current) drug therapy: Secondary | ICD-10-CM | POA: Insufficient documentation

## 2017-04-23 DIAGNOSIS — F419 Anxiety disorder, unspecified: Secondary | ICD-10-CM | POA: Insufficient documentation

## 2017-04-23 DIAGNOSIS — Z794 Long term (current) use of insulin: Secondary | ICD-10-CM | POA: Diagnosis not present

## 2017-04-23 DIAGNOSIS — K219 Gastro-esophageal reflux disease without esophagitis: Secondary | ICD-10-CM | POA: Insufficient documentation

## 2017-04-23 DIAGNOSIS — K5900 Constipation, unspecified: Secondary | ICD-10-CM | POA: Insufficient documentation

## 2017-04-23 DIAGNOSIS — Z87440 Personal history of urinary (tract) infections: Secondary | ICD-10-CM | POA: Insufficient documentation

## 2017-04-23 DIAGNOSIS — N182 Chronic kidney disease, stage 2 (mild): Secondary | ICD-10-CM | POA: Diagnosis not present

## 2017-04-23 DIAGNOSIS — E785 Hyperlipidemia, unspecified: Secondary | ICD-10-CM | POA: Diagnosis not present

## 2017-04-23 DIAGNOSIS — Z888 Allergy status to other drugs, medicaments and biological substances status: Secondary | ICD-10-CM | POA: Insufficient documentation

## 2017-04-23 DIAGNOSIS — R55 Syncope and collapse: Secondary | ICD-10-CM

## 2017-04-23 DIAGNOSIS — I480 Paroxysmal atrial fibrillation: Secondary | ICD-10-CM | POA: Diagnosis not present

## 2017-04-23 DIAGNOSIS — E7800 Pure hypercholesterolemia, unspecified: Secondary | ICD-10-CM | POA: Insufficient documentation

## 2017-04-23 DIAGNOSIS — N3001 Acute cystitis with hematuria: Secondary | ICD-10-CM

## 2017-04-23 DIAGNOSIS — R531 Weakness: Secondary | ICD-10-CM | POA: Diagnosis not present

## 2017-04-23 DIAGNOSIS — I10 Essential (primary) hypertension: Secondary | ICD-10-CM | POA: Diagnosis not present

## 2017-04-23 DIAGNOSIS — M48061 Spinal stenosis, lumbar region without neurogenic claudication: Secondary | ICD-10-CM | POA: Diagnosis not present

## 2017-04-23 DIAGNOSIS — G4733 Obstructive sleep apnea (adult) (pediatric): Secondary | ICD-10-CM | POA: Diagnosis not present

## 2017-04-23 DIAGNOSIS — H409 Unspecified glaucoma: Secondary | ICD-10-CM | POA: Diagnosis not present

## 2017-04-23 DIAGNOSIS — Z955 Presence of coronary angioplasty implant and graft: Secondary | ICD-10-CM | POA: Insufficient documentation

## 2017-04-23 DIAGNOSIS — M8580 Other specified disorders of bone density and structure, unspecified site: Secondary | ICD-10-CM | POA: Insufficient documentation

## 2017-04-23 DIAGNOSIS — I2510 Atherosclerotic heart disease of native coronary artery without angina pectoris: Secondary | ICD-10-CM | POA: Insufficient documentation

## 2017-04-23 DIAGNOSIS — Z66 Do not resuscitate: Secondary | ICD-10-CM | POA: Insufficient documentation

## 2017-04-23 DIAGNOSIS — I129 Hypertensive chronic kidney disease with stage 1 through stage 4 chronic kidney disease, or unspecified chronic kidney disease: Secondary | ICD-10-CM | POA: Diagnosis not present

## 2017-04-23 DIAGNOSIS — M1990 Unspecified osteoarthritis, unspecified site: Secondary | ICD-10-CM | POA: Diagnosis not present

## 2017-04-23 DIAGNOSIS — N3281 Overactive bladder: Secondary | ICD-10-CM | POA: Insufficient documentation

## 2017-04-23 DIAGNOSIS — Z7901 Long term (current) use of anticoagulants: Secondary | ICD-10-CM | POA: Insufficient documentation

## 2017-04-23 DIAGNOSIS — R2689 Other abnormalities of gait and mobility: Secondary | ICD-10-CM | POA: Insufficient documentation

## 2017-04-23 LAB — BASIC METABOLIC PANEL
Anion gap: 11 (ref 5–15)
BUN: 28 mg/dL — AB (ref 6–20)
CALCIUM: 9.6 mg/dL (ref 8.9–10.3)
CO2: 26 mmol/L (ref 22–32)
CREATININE: 1.16 mg/dL — AB (ref 0.44–1.00)
Chloride: 100 mmol/L — ABNORMAL LOW (ref 101–111)
GFR calc non Af Amer: 42 mL/min — ABNORMAL LOW (ref >60)
GFR, EST AFRICAN AMERICAN: 48 mL/min — AB (ref >60)
Glucose, Bld: 205 mg/dL — ABNORMAL HIGH (ref 65–99)
Potassium: 4.3 mmol/L (ref 3.5–5.1)
Sodium: 137 mmol/L (ref 135–145)

## 2017-04-23 LAB — CBC WITH DIFFERENTIAL/PLATELET
BASOS ABS: 0.1 10*3/uL (ref 0–0.1)
BASOS PCT: 1 %
Eosinophils Absolute: 0.1 10*3/uL (ref 0–0.7)
Eosinophils Relative: 1 %
HEMATOCRIT: 38.7 % (ref 35.0–47.0)
HEMOGLOBIN: 12.8 g/dL (ref 12.0–16.0)
LYMPHS PCT: 10 %
Lymphs Abs: 1 10*3/uL (ref 1.0–3.6)
MCH: 31.2 pg (ref 26.0–34.0)
MCHC: 33 g/dL (ref 32.0–36.0)
MCV: 94.5 fL (ref 80.0–100.0)
MONOS PCT: 7 %
Monocytes Absolute: 0.7 10*3/uL (ref 0.2–0.9)
NEUTROS ABS: 8.1 10*3/uL — AB (ref 1.4–6.5)
NEUTROS PCT: 81 %
Platelets: 287 10*3/uL (ref 150–440)
RBC: 4.09 MIL/uL (ref 3.80–5.20)
RDW: 14.6 % — ABNORMAL HIGH (ref 11.5–14.5)
WBC: 10 10*3/uL (ref 3.6–11.0)

## 2017-04-23 LAB — URINALYSIS, COMPLETE (UACMP) WITH MICROSCOPIC
BILIRUBIN URINE: NEGATIVE
Glucose, UA: NEGATIVE mg/dL
Hgb urine dipstick: NEGATIVE
KETONES UR: NEGATIVE mg/dL
Nitrite: NEGATIVE
PROTEIN: NEGATIVE mg/dL
Specific Gravity, Urine: 1.014 (ref 1.005–1.030)
pH: 6 (ref 5.0–8.0)

## 2017-04-23 LAB — GLUCOSE, CAPILLARY: Glucose-Capillary: 195 mg/dL — ABNORMAL HIGH (ref 65–99)

## 2017-04-23 LAB — TROPONIN I: Troponin I: <0.03 ng/mL (ref <0.03)

## 2017-04-23 MED ORDER — DOCUSATE SODIUM 100 MG PO CAPS
100.0000 mg | ORAL_CAPSULE | Freq: Every day | ORAL | Status: DC
  Administered 2017-04-23 – 2017-04-24 (×2): 100 mg via ORAL
  Filled 2017-04-23 (×2): qty 1

## 2017-04-23 MED ORDER — LORATADINE 10 MG PO TABS
10.0000 mg | ORAL_TABLET | Freq: Every day | ORAL | Status: DC
Start: 2017-04-24 — End: 2017-04-25
  Administered 2017-04-24 – 2017-04-25 (×2): 10 mg via ORAL
  Filled 2017-04-23 (×2): qty 1

## 2017-04-23 MED ORDER — PANTOPRAZOLE SODIUM 40 MG PO TBEC
40.0000 mg | DELAYED_RELEASE_TABLET | Freq: Every day | ORAL | Status: DC
  Administered 2017-04-24 – 2017-04-25 (×2): 40 mg via ORAL
  Filled 2017-04-23 (×2): qty 1

## 2017-04-23 MED ORDER — PAROXETINE HCL 10 MG PO TABS
10.0000 mg | ORAL_TABLET | Freq: Every day | ORAL | Status: DC
  Administered 2017-04-24 – 2017-04-25 (×2): 10 mg via ORAL
  Filled 2017-04-23 (×2): qty 1

## 2017-04-23 MED ORDER — LATANOPROST 0.005 % OP SOLN
1.0000 [drp] | Freq: Every day | OPHTHALMIC | Status: DC
  Administered 2017-04-24 (×2): 1 [drp] via OPHTHALMIC
  Filled 2017-04-23: qty 2.5

## 2017-04-23 MED ORDER — TRAMADOL HCL 50 MG PO TABS
50.0000 mg | ORAL_TABLET | Freq: Two times a day (BID) | ORAL | Status: DC
  Administered 2017-04-24: 50 mg via ORAL
  Filled 2017-04-23: qty 1

## 2017-04-23 MED ORDER — ACETAMINOPHEN 325 MG PO TABS
650.0000 mg | ORAL_TABLET | Freq: Three times a day (TID) | ORAL | Status: DC | PRN
  Administered 2017-04-24: 650 mg via ORAL
  Filled 2017-04-23: qty 2

## 2017-04-23 MED ORDER — RIVAROXABAN 15 MG PO TABS
15.0000 mg | ORAL_TABLET | Freq: Every day | ORAL | Status: DC
  Administered 2017-04-24 – 2017-04-25 (×2): 15 mg via ORAL
  Filled 2017-04-23 (×2): qty 1

## 2017-04-23 MED ORDER — AMOXICILLIN-POT CLAVULANATE 875-125 MG PO TABS: 1.0000 | ORAL_TABLET | Freq: Two times a day (BID) | ORAL | 0 refills | Status: DC

## 2017-04-23 MED ORDER — GABAPENTIN 300 MG PO CAPS
300.0000 mg | ORAL_CAPSULE | Freq: Three times a day (TID) | ORAL | Status: DC
  Administered 2017-04-23 – 2017-04-25 (×6): 300 mg via ORAL
  Filled 2017-04-23 (×6): qty 1

## 2017-04-23 MED ORDER — DOCUSATE SODIUM 100 MG PO CAPS: 100.0000 mg | ORAL_CAPSULE | Freq: Two times a day (BID) | ORAL | Status: DC | PRN

## 2017-04-23 MED ORDER — NITROGLYCERIN 0.4 MG SL SUBL: 0.4000 mg | SUBLINGUAL_TABLET | SUBLINGUAL | Status: DC | PRN

## 2017-04-23 MED ORDER — SODIUM CHLORIDE 0.9 % IV SOLN
1.0000 g | Freq: Every day | INTRAVENOUS | Status: DC
  Filled 2017-04-23: qty 10

## 2017-04-23 MED ORDER — IPRATROPIUM BROMIDE 0.06 % NA SOLN
1.0000 | Freq: Every day | NASAL | Status: DC
  Administered 2017-04-24 – 2017-04-25 (×2): 1 via NASAL
  Filled 2017-04-23 (×2): qty 15

## 2017-04-23 MED ORDER — OMEGA-3-ACID ETHYL ESTERS 1 G PO CAPS
1.0000 g | ORAL_CAPSULE | Freq: Two times a day (BID) | ORAL | Status: DC
  Administered 2017-04-24 – 2017-04-25 (×3): 1 g via ORAL
  Filled 2017-04-23 (×3): qty 1

## 2017-04-23 MED ORDER — MIRABEGRON ER 25 MG PO TB24
25.0000 mg | ORAL_TABLET | Freq: Every day | ORAL | Status: DC
  Administered 2017-04-24 – 2017-04-25 (×2): 25 mg via ORAL
  Filled 2017-04-23 (×2): qty 1

## 2017-04-23 MED ORDER — ROSUVASTATIN CALCIUM 20 MG PO TABS
40.0000 mg | ORAL_TABLET | Freq: Every day | ORAL | Status: DC
  Administered 2017-04-24 – 2017-04-25 (×2): 40 mg via ORAL
  Filled 2017-04-23 (×2): qty 2

## 2017-04-23 MED ORDER — METOPROLOL TARTRATE 25 MG PO TABS
12.5000 mg | ORAL_TABLET | Freq: Two times a day (BID) | ORAL | Status: DC
  Administered 2017-04-23 – 2017-04-25 (×4): 12.5 mg via ORAL
  Filled 2017-04-23 (×4): qty 1

## 2017-04-23 MED ORDER — CEPHALEXIN 500 MG PO CAPS
500.0000 mg | ORAL_CAPSULE | Freq: Once | ORAL | Status: DC
  Filled 2017-04-23: qty 1

## 2017-04-23 MED ORDER — LINAGLIPTIN 5 MG PO TABS
5.0000 mg | ORAL_TABLET | Freq: Every day | ORAL | Status: DC
  Administered 2017-04-24 – 2017-04-25 (×2): 5 mg via ORAL
  Filled 2017-04-23 (×2): qty 1

## 2017-04-23 MED ORDER — AMOXICILLIN-POT CLAVULANATE 875-125 MG PO TABS
1.0000 | ORAL_TABLET | Freq: Once | ORAL | Status: AC
Start: 2017-04-23 — End: 2017-04-23
  Administered 2017-04-23: 1 via ORAL
  Filled 2017-04-23: qty 1

## 2017-04-23 MED ORDER — FUROSEMIDE 40 MG PO TABS
40.0000 mg | ORAL_TABLET | Freq: Every day | ORAL | Status: DC
  Administered 2017-04-24 – 2017-04-25 (×2): 40 mg via ORAL
  Filled 2017-04-23 (×2): qty 1

## 2017-04-23 MED ORDER — DARIFENACIN HYDROBROMIDE ER 7.5 MG PO TB24: 7.5000 mg | ORAL_TABLET | Freq: Every day | ORAL | Status: DC

## 2017-04-23 MED ORDER — INSULIN ASPART 100 UNIT/ML ~~LOC~~ SOLN
0.0000 [IU] | Freq: Three times a day (TID) | SUBCUTANEOUS | Status: DC
  Administered 2017-04-24: 5 [IU] via SUBCUTANEOUS
  Administered 2017-04-24: 08:00:00 3 [IU] via SUBCUTANEOUS
  Administered 2017-04-24: 17:00:00 7 [IU] via SUBCUTANEOUS
  Administered 2017-04-25: 12:00:00 9 [IU] via SUBCUTANEOUS
  Administered 2017-04-25: 7 [IU] via SUBCUTANEOUS
  Administered 2017-04-25: 3 [IU] via SUBCUTANEOUS
  Filled 2017-04-23 (×6): qty 1

## 2017-04-23 MED ORDER — INSULIN GLARGINE 100 UNIT/ML ~~LOC~~ SOLN
25.0000 [IU] | Freq: Two times a day (BID) | SUBCUTANEOUS | Status: DC
  Administered 2017-04-24 – 2017-04-25 (×4): 25 [IU] via SUBCUTANEOUS
  Filled 2017-04-23 (×5): qty 0.25

## 2017-04-23 MED ORDER — ALPRAZOLAM 0.25 MG PO TABS
0.2500 mg | ORAL_TABLET | Freq: Every evening | ORAL | Status: DC | PRN
  Administered 2017-04-23 – 2017-04-24 (×2): 0.25 mg via ORAL
  Filled 2017-04-23 (×2): qty 1

## 2017-04-23 MED ORDER — OMEGA-3 FATTY ACIDS 1000 MG PO CAPS: 1.0000 g | ORAL_CAPSULE | Freq: Every day | ORAL | Status: DC

## 2017-04-23 MED ORDER — LEVOCETIRIZINE DIHYDROCHLORIDE 5 MG PO TABS
5.0000 mg | ORAL_TABLET | Freq: Every evening | ORAL | Status: DC
  Filled 2017-04-23 (×2): qty 1

## 2017-04-23 NOTE — H&P (Addendum)
Abigail Boyd NAME: Taylynn Easton    MR#:  [id number]  DATE OF BIRTH:  02/01/1930  DATE OF ADMISSION:  04/23/2017  PRIMARY CARE PHYSICIAN: Leone Haven, MD   REQUESTING/REFERRING PHYSICIAN: Alfred Levins  CHIEF COMPLAINT:   Chief Complaint  Patient presents with  . Loss of Consciousness  . Dizziness    HISTORY OF PRESENT ILLNESS: Abigail Boyd  is a 84 y.o. female with a known history of anxiety, bilateral cataracts, coronary artery disease, status post PCI, chronic kidney disease stage II, diverticulitis, essential hypertension, glaucoma, gastroesophageal reflux disease, paroxysmal atrial fibrillation, stroke- lives at home with her daughter and having recurrent infections with UTI in last few weeks. Treated for UTI with Keflex recently from ER. Her Escherichia coli in urine culture was sensitive to that also. Again today when she was in the bathroom she felt very weak and had a syncopal episode and she could not stand up from there, so her daughter called ambulance and brought her to emergency room. Patient denies any associated symptoms and she was noted to have UTI again so ER physician tried to discharge her home with some oral antibiotics, but when she tried to stand her up she could barely stand up or walk. She checked her orthostatic vital signs and they were stable but patient could not stay up or walk so cannot go home, given to hospitalist team for further management.  PAST MEDICAL HISTORY:   Past Medical History:  Diagnosis Date  . Abnormal nuclear stress test 06/21/2011   Inferolateral reversible defect;--> cardiac cath & PCI of CxOM, occluded RCA with collaterals;; followup Myoview 11/2012: Low risk. Fixed basal inferior artifact normal EF. No ischemia  . Anxiety    When necessary Xanax  . Bilateral cataracts    Status post stroke or correction  . CAD S/P percutaneous coronary angioplasty 06/2011   s/P PCI to proximal OM1 w/  DES; occluded RCA with bridging and L-R collaterals (medical management)  . Chronic kidney disease (CKD), stage II (mild)    Related to current bladder infections (although diabetes cannot be excluded)  . Chronotropic incompetence with sinus node dysfunction [facility]) October 2013   On CPET test; beta blockers reduced  . Diabetes mellitus type 2, controlled ([place])    On oral medications  . Diverticulitis   . Essential hypertension    Allowing for permissive hypertension to avoid orthostatic hypotension  . GERD (gastroesophageal reflux disease)    On PPI  . Glaucoma   . History of syncope    Per EP - neurocardiogenic & not Bradycardia related (no PPM)  . History of unstable angina 06/13/2011   Jaw pain awakening from sleep -- Myoview --CATH --> PCI  . Hyperlipidemia with target LDL less than 70    HDL at goal, LDL not at goal, borderline triglycerides. On Crestor 20 mg  . Migraines   . OSA (obstructive sleep apnea)    hx bladder infections  . Osteoarthritis   . PAF (paroxysmal atrial fibrillation) ([place]) 10-11/ 2014   CardioNet Event Monitor: NSR & S Brady -- Rates 50-100; Total A. fib burden 35 hours and 27 minutes. 1296 episodes, longest was 1 hour 29 minutes. Rate ranged from 52-169 beats per minute.  . Seasonal allergies   . Shortness of breath on exertion October 2013   2-D echo: Normal EF>55%, Gr 1DD, mild aortic sclerosis; Evaluated with CPET - peak VO2 97%; Chronotropic Incompetence (submaximal effort)  . Spinal stenosis  of lumbar region 11/2011  . Stroke ([place])   . Tachycardia-bradycardia syndrome ([place]) 11/2012  . Urge incontinence     PAST SURGICAL HISTORY:  Past Surgical History:  Procedure Laterality Date  . APPENDECTOMY    . BREAST BIOPSY     both breast  . cataracts     both eyes  . COLONOSCOPY    . CORONARY ANGIOPLASTY WITH STENT PLACEMENT  07/05/2011   Promus Element DES 2.5 mm x 16 mm-post dilated to 2.65 mm CX-Proximal OM1  . Holter Monitor  04/2015   Sinus  rhythm with rates 52-149 BPM. Isolated PACs with rare couplets and bigeminy. Multiple short runs of PAT/PSVT. Arrhythmia run 120 bpm for 204 beats. 14% of time was in A. fib/flutter. - Reviewed by Dr. Caryl Comes. Not thought to be significant enough for her syncope.  Marland Kitchen KNEE ARTHROSCOPY WITH MEDIAL MENISECTOMY Right 06/24/2014   Procedure: RIGHT KNEE ARTHROSCOPY WITH partial lateral MENISECTOMY, abrasion chondroplasty of medial femoral condyle and patella, microfracture technique;  Surgeon: Latanya Maudlin, MD;  Location: [facility];  Service: Orthopedics;  Laterality: Right;  . LEFT HEART CATHETERIZATION WITH CORONARY ANGIOGRAM N/A 07/06/2011   Procedure: LEFT HEART CATHETERIZATION WITH CORONARY ANGIOGRAM;  Surgeon: Leonie Man, MD;  Location: [facility] CATH LAB: Unstable Angina --> Myoview wiht Inf-Lat Ischemia.  Proximal OM1 lesion --> PCI; 100% mid RCA with right to right and left to right bridging collaterals from circumflex RPL and LAD the PDA.  Marland Kitchen NM MYOVIEW LTD  October 2014    Low risk. Fixed basal inferior artifact normal EF. No ischemia --> (as compared to pre-PCI Myoview revealing inferolateral ischemia)  . TRANSTHORACIC ECHOCARDIOGRAM  11/2014   EF 65-70%, Mod LVH. Normal wall motion. Gr 1 DD. Normal valves.  Marland Kitchen VAGINAL HYSTERECTOMY      SOCIAL HISTORY:  Social History   Tobacco Use  . Smoking status: Never Smoker  . Smokeless tobacco: Never Used  Substance Use Topics  . Alcohol use: No    FAMILY HISTORY:  Family History  Problem Relation Age of Onset  . COPD Mother   . Pulmonary fibrosis Mother   . Heart attack Father   . Heart disease Father   . Stroke Father   . Cancer Sister   . Cancer Sister   . Clotting disorder Sister   . Heart attack Sister   . Arthritis Unknown   . Breast cancer Sister   . Colon cancer Neg Hx   . Esophageal cancer Neg Hx   . Stomach cancer Neg Hx     DRUG ALLERGIES:  Allergies  Allergen Reactions  . Amlodipine Cough  . Clopidogrel Bisulfate Cough  .  Kenalog [Triamcinolone Acetonide]     unknown  . Lisinopril Cough  . Losartan Cough  . Sulfa Antibiotics Hives and Rash    REVIEW OF SYSTEMS:   CONSTITUTIONAL: No fever, positive for fatigue or weakness.  EYES: No blurred or double vision.  EARS, NOSE, AND THROAT: No tinnitus or ear pain.  RESPIRATORY: No cough, shortness of breath, wheezing or hemoptysis.  CARDIOVASCULAR: No chest pain, orthopnea, edema.  GASTROINTESTINAL: No nausea, vomiting, diarrhea or abdominal pain.  GENITOURINARY: No dysuria, hematuria.  ENDOCRINE: No polyuria, nocturia,  HEMATOLOGY: No anemia, easy bruising or bleeding SKIN: No rash or lesion. MUSCULOSKELETAL: No joint pain or arthritis.   NEUROLOGIC: No tingling, numbness, have generalized weakness.  PSYCHIATRY: No anxiety or depression.   MEDICATIONS AT HOME:  Prior to Admission medications   Medication Sig Start Date End  Date Taking? Authorizing Provider  acetaminophen (TYLENOL) 650 MG CR tablet Take 1,300 mg by mouth 2 (two) times daily as needed for pain.   Yes [provider]  ALPRAZolam Duanne Moron) 0.25 MG tablet TAKE 1 TABLET BY MOUTH AT BEDTIME AS NEEDED SLEEP 12/12/16  Yes Leone Haven, MD  docusate sodium (STOOL SOFTENER) 100 MG capsule Take 100 mg by mouth at bedtime.    Yes [provider]  fish oil-omega-3 fatty acids 1000 MG capsule Take 1 g by mouth daily.    Yes [provider]  furosemide (LASIX) 20 MG tablet Take 2 tablets (40 mg total) by mouth daily. 04/09/17  Yes Leonie Man, MD  gabapentin (NEURONTIN) 300 MG capsule Take 300 mg by mouth 3 (three) times daily. gabapentin 300 mg capsule  take one cap po X 1 week, then take one cap po bid X 1 week, then take one cap po tid and continue   Yes [provider]  Insulin Glargine (LANTUS) 100 UNIT/ML Solostar Pen Inject 40 Units into the skin 2 (two) times daily.  03/31/15  Yes [provider]  insulin lispro (HUMALOG KWIKPEN) 100 UNIT/ML KiwkPen  Inject 22 Units into the skin 3 (three) times daily as needed (CBG >200).    Yes [provider]  ipratropium (ATROVENT) 0.06 % nasal spray Place 1 spray into the nose daily.  12/08/10  Yes [provider]  levocetirizine (XYZAL) 5 MG tablet Take 5 mg by mouth every evening.  03/22/15  Yes [provider]  loratadine (CLARITIN) 10 MG tablet Take 10 mg by mouth daily.   Yes [provider]  LUMIGAN 0.01 % SOLN Place 1 drop into both eyes at bedtime.  03/06/15  Yes [provider]  Magnesium 500 MG CAPS Take 1,000 mg by mouth at bedtime.    Yes [provider]  metoprolol tartrate (LOPRESSOR) 25 MG tablet TAKE 1 TABLET BY MOUTH  TWICE DAILY. HOLD IF  SYSTOLIC BP IS UNDER 540  AND HEART RATE UNDER 60 12/26/16  Yes Leonie Man, MD  mirabegron ER (MYRBETRIQ) 50 MG TB24 tablet Take one tablet by mouth once daily for kidneys   Yes [provider]  nitroGLYCERIN (NITROSTAT) 0.4 MG SL tablet Place 0.4 mg under the tongue every 5 (five) minutes as needed for chest pain.   Yes [provider]  nystatin cream (MYCOSTATIN) Apply 1 application topically daily. 12/12/16  Yes Leone Haven, MD  pantoprazole (PROTONIX) 40 MG tablet Take 1 tablet (40 mg total) by mouth daily. 04/05/17  Yes Leone Haven, MD  PARoxetine (PAXIL) 10 MG tablet TAKE 1 TABLET BY MOUTH  DAILY 04/06/17  Yes Leone Haven, MD  rOPINIRole (REQUIP) 0.5 MG tablet TAKE 1 TABLET BY MOUTH AT  BEDTIME 12/26/16  Yes Leone Haven, MD  rosuvastatin (CRESTOR) 40 MG tablet TAKE 1 TABLET BY MOUTH  DAILY 12/26/16  Yes Leonie Man, MD  sitaGLIPtin (JANUVIA) 50 MG tablet Take 50 mg by mouth at bedtime.    Yes [provider]  traMADol (ULTRAM) 50 MG tablet Take 50 mg by mouth 2 (two) times daily.   Yes [provider]  VESICARE 5 MG tablet TAKE 1 TABLET (5 MG TOTAL) BY MOUTH ONCE DAILY. 03/30/15  Yes [provider]  XARELTO 15 MG TABS  tablet TAKE 1 TABLET BY MOUTH  DAILY WITH SUPPER 03/14/17  Yes Leonie Man, MD  amoxicillin-clavulanate (AUGMENTIN) 875-125 MG tablet Take 1  tablet by mouth 2 (two) times daily for 10 days. 04/23/17 05/03/17  Rudene Re, MD  cephALEXin (KEFLEX) 500 MG capsule Take 1 capsule (500 mg total) by mouth 2 (two) times daily. Patient not taking: Reported on 04/23/2017 04/12/17   Delano Metz, FNP  ONE TOUCH ULTRA TEST test strip 1 each by Other route as needed.  03/18/15   [provider]      PHYSICAL EXAMINATION:   VITAL SIGNS: Blood pressure (!) 167/73, pulse 80, temperature (!) 97.2 F (36.2 C), temperature source Oral, resp. rate 15, height 5\' 4"  (1.626 m), weight 99.8 kg (220 lb), SpO2 96 %.  GENERAL:  84 y.o.-year-old patient lying in the bed with no acute distress.  EYES: Pupils equal, round, reactive to light and accommodation. No scleral icterus. Extraocular muscles intact.  HEENT: Head atraumatic, normocephalic. Oropharynx and nasopharynx clear.  NECK:  Supple, no jugular venous distention. No thyroid enlargement, no tenderness.  LUNGS: Normal breath sounds bilaterally, no wheezing, rales,rhonchi or crepitation. No use of accessory muscles of respiration.  CARDIOVASCULAR: S1, S2 normal. No murmurs, rubs, or gallops.  ABDOMEN: Soft, nontender, nondistended. Bowel sounds present. No organomegaly or mass.  EXTREMITIES: No pedal edema, cyanosis, or clubbing.  NEUROLOGIC: Cranial nerves II through XII are intact. Muscle strength 3/5 in all extremities. Sensation intact. Gait not checked.  PSYCHIATRIC: The patient is alert and oriented x 3.  SKIN: No obvious rash, lesion, or ulcer.   LABORATORY PANEL:   CBC Recent Labs  Lab 04/23/17 1514  WBC 10.0  HGB 12.8  HCT 38.7  PLT 287  MCV 94.5  MCH 31.2  MCHC 33.0  RDW 14.6*  LYMPHSABS 1.0  MONOABS 0.7  EOSABS 0.1  BASOSABS 0.1    ------------------------------------------------------------------------------------------------------------------  Chemistries  Recent Labs  Lab 04/23/17 1514  NA 137  K 4.3  CL 100*  CO2 26  GLUCOSE 205*  BUN 28*  CREATININE 1.16*  CALCIUM 9.6   ------------------------------------------------------------------------------------------------------------------ estimated creatinine clearance is 40.7 mL/min (A) (by C-G formula based on SCr of 1.16 mg/dL (H)). ------------------------------------------------------------------------------------------------------------------ No results for input(s): TSH, T4TOTAL, T3FREE, THYROIDAB in the last 72 hours.  Invalid input(s): FREET3   Coagulation profile No results for input(s): INR, PROTIME in the last 168 hours. ------------------------------------------------------------------------------------------------------------------- No results for input(s): DDIMER in the last 72 hours. -------------------------------------------------------------------------------------------------------------------  Cardiac Enzymes Recent Labs  Lab 04/23/17 1514  TROPONINI <0.03   ------------------------------------------------------------------------------------------------------------------ Invalid input(s): POCBNP  ---------------------------------------------------------------------------------------------------------------  Urinalysis    Component Value Date/Time   COLORURINE YELLOW (A) 04/23/2017 1521   APPEARANCEUR HAZY (A) 04/23/2017 1521   LABSPEC 1.014 04/23/2017 1521   PHURINE 6.0 04/23/2017 1521   GLUCOSEU NEGATIVE 04/23/2017 1521   GLUCOSEU NEGATIVE 08/19/2015 1630   HGBUR NEGATIVE 04/23/2017 1521   BILIRUBINUR NEGATIVE 04/23/2017 1521   BILIRUBINUR negative 04/12/2017 1355   KETONESUR NEGATIVE 04/23/2017 1521   PROTEINUR NEGATIVE 04/23/2017 1521   UROBILINOGEN 0.2 04/12/2017 1355   UROBILINOGEN 0.2 08/19/2015 1630    NITRITE NEGATIVE 04/23/2017 1521   LEUKOCYTESUR TRACE (A) 04/23/2017 1521     RADIOLOGY: No results found.  EKG: Orders placed or performed during the hospital encounter of 04/23/17  . ED EKG  . ED EKG    IMPRESSION AND PLAN:  * Syncopal episode   Monitor on telemetry, get physical therapy evaluation.  * Recurrent UTI   She had Escherichia coli every time which was sensitive to most of all the antibiotic   Given Keflex in last admission and Escherichia coli was sensitive,  I will give Rocephin IV this time.   Send urine culture, would like to check for ultrasound on kidneys and get a urology consult for recurrent UTI.  * Diabetes   Continue long-acting insulin at lower dose and keep on sliding scale coverage.  * A. fib with history of stroke   Continue anticoagulation.  * Hypertension   Continue metoprolol  * Hyperlipidemia   Continue Crestor.  All the records are reviewed and case discussed with ED provider. Management plans discussed with the patient, family and they are in agreement.  CODE STATUS: DNR Code Status History    Date Active Date Inactive Code Status Order ID Comments User Context   12/26/2014 18:22 12/29/2014 16:32 Full Code [id number]  Allie Bossier, MD ED   12/19/2012 22:29 12/20/2012 21:24 Full Code [id number]  Consuelo Pandy, PA-C Inpatient       TOTAL TIME TAKING CARE OF THIS PATIENT: 50 minutes.    [name] M.D on 04/23/2017   Between 7am to 6pm - Pager - [phone]  After 6pm go to [email or web address] - password [email or web address] [facility]  Office  [phone]  CC: Primary care physician; Leone Haven, MD   Note: This dictation was prepared with Dragon dictation along with smaller phrase technology. Any transcriptional errors that result from this process are unintentional.

## 2017-04-23 NOTE — ED Provider Notes (Addendum)
[facility] Emergency Department Provider Note  ____________________________________________  Time seen: Approximately 3:25 PM  I have reviewed the triage vital signs and the nursing notes.   HISTORY  Chief Complaint Loss of Consciousness and Dizziness   HPI Abigail Boyd is a 84 y.o. female with a history of CAD, CKD, DM, HTN, paroxysmal afib on Xarelto,syncope who presents for evaluation of syncopal episode. Patient reports that she was in her usual state of health. She finished breakfast this morning and was walking to the bathroom. She started feeling dizzy. She sat on the commode. She was accompanied by her home health aide. According to EMS aide said the patient had a brief syncopal episode. Patient does not recall passing out. She just reports feeling dizzy sitting on the commode. She did not fall. No recent falls or head trauma. No chest pain or shortness of breath, no URI symptoms, no fever or chills, no vomiting or diarrhea, no palpitations, no weakness or numbness, no headache, no facial droop or slurred speech. Patient reports that she feels back to her baseline at this time.  Chief Complaint: syncope Severity: Full LOC Duration: few seconds Timing: prior to arrival Context: while sitting on the commode Associated signs/symptoms: no HA, CP, SOB, stroke like symptoms. Patient had dizziness preceding this episode.   Past Medical History:  Diagnosis Date  . Abnormal nuclear stress test 06/21/2011   Inferolateral reversible defect;--> cardiac cath & PCI of CxOM, occluded RCA with collaterals;; followup Myoview 11/2012: Low risk. Fixed basal inferior artifact normal EF. No ischemia  . Anxiety    When necessary Xanax  . Bilateral cataracts    Status post stroke or correction  . CAD S/P percutaneous coronary angioplasty 06/2011   s/P PCI to proximal OM1 w/ DES; occluded RCA with bridging and L-R collaterals (medical management)  . Chronic kidney  disease (CKD), stage II (mild)    Related to current bladder infections (although diabetes cannot be excluded)  . Chronotropic incompetence with sinus node dysfunction [facility]) October 2013   On CPET test; beta blockers reduced  . Diabetes mellitus type 2, controlled ([place])    On oral medications  . Diverticulitis   . Essential hypertension    Allowing for permissive hypertension to avoid orthostatic hypotension  . GERD (gastroesophageal reflux disease)    On PPI  . Glaucoma   . History of syncope    Per EP - neurocardiogenic & not Bradycardia related (no PPM)  . History of unstable angina 06/13/2011   Jaw pain awakening from sleep -- Myoview --CATH --> PCI  . Hyperlipidemia with target LDL less than 70    HDL at goal, LDL not at goal, borderline triglycerides. On Crestor 20 mg  . Migraines   . OSA (obstructive sleep apnea)    hx bladder infections  . Osteoarthritis   . PAF (paroxysmal atrial fibrillation) ([place]) 10-11/ 2014   CardioNet Event Monitor: NSR & S Brady -- Rates 50-100; Total A. fib burden 35 hours and 27 minutes. 1296 episodes, longest was 1 hour 29 minutes. Rate ranged from 52-169 beats per minute.  . Seasonal allergies   . Shortness of breath on exertion October 2013   2-D echo: Normal EF>55%, Gr 1DD, mild aortic sclerosis; Evaluated with CPET - peak VO2 97%; Chronotropic Incompetence (submaximal effort)  . Spinal stenosis of lumbar region 11/2011  . Stroke ([place])   . Tachycardia-bradycardia syndrome ([place]) 11/2012  . Urge incontinence     Patient Active Problem List  Diagnosis Date Noted  . Dysuria 12/12/2016  . Atypical chest pain 12/12/2016  . Anxiety 12/13/2015  . Allergic rhinitis 12/13/2015  . Shortness of breath 11/18/2015  . Orthostatic hypotension 05/30/2015  . History of cardioembolic cerebrovascular accident (CVA) 05/24/2015  . Syncope 04/23/2015  . Urge incontinence 03/15/2015  . Bilateral lower extremity edema 03/15/2015  . Constipation 03/15/2015    . Right knee pain 03/15/2015  . Hematochezia 03/15/2015  . CKD (chronic kidney disease), stage II   . Osteoporosis screening 06/10/2014  . PLMD (periodic limb movement disorder) 06/12/2013  . Obstructive sleep apnea 03/05/2013  . Tachy-brady syndrome: Rates range from 50-169 bpm in A. fib 12/20/2012  . History of syncope: Unclear etiology 12/19/2012  . Chronotropic incompetence - partially medication related 08/18/2012  . PAF (paroxysmal atrial fibrillation) (HCC);  CHA2DS2-VASc Score =6 on 15 mg Xarelto 05/16/2012    Class: Diagnosis of  . Long term current use of anticoagulant therapy 05/16/2012  . CAD-PCI OM1, 100% RCA (L-R colllaterals) 07/07/2011  . Hypercholesteremia     Class: Diagnosis of  . Essential hypertension     Class: Diagnosis of  . Spinal stenosis     Class: History of  . Diabetes mellitus (HCC)     Class: Diagnosis of  . Unstable angina - history of; resolved, status post PCI of the OM 06/13/2011    Class: History of  . Benign endometrial hyperplasia 03/21/2011  . Diverticulitis 03/21/2011  . GERD (gastroesophageal reflux disease) 03/21/2011  . Glaucoma (increased eye pressure) 03/21/2011  . Goiter 03/21/2011  . Hemorrhoid 03/21/2011  . History of anemia 03/21/2011  . History of carpal tunnel syndrome 03/21/2011  . Osteopenia 03/21/2011  . Vitamin D deficiency disease 03/21/2011    Past Surgical History:  Procedure Laterality Date  . APPENDECTOMY    . BREAST BIOPSY     both breast  . cataracts     both eyes  . COLONOSCOPY    . CORONARY ANGIOPLASTY WITH STENT PLACEMENT  07/05/2011   Promus Element DES 2.5 mm x 16 mm-post dilated to 2.65 mm CX-Proximal OM1  . Holter Monitor  04/2015   Sinus rhythm with rates 52-149 BPM. Isolated PACs with rare couplets and bigeminy. Multiple short runs of PAT/PSVT. Arrhythmia run 120 bpm for 204 beats. 14% of time was in A. fib/flutter. - Reviewed by Dr. Caryl Comes. Not thought to be significant enough for her syncope.  Marland Kitchen  KNEE ARTHROSCOPY WITH MEDIAL MENISECTOMY Right 06/24/2014   Procedure: RIGHT KNEE ARTHROSCOPY WITH partial lateral MENISECTOMY, abrasion chondroplasty of medial femoral condyle and patella, microfracture technique;  Surgeon: Latanya Maudlin, MD;  Location: [facility]S;  Service: Orthopedics;  Laterality: Right;  . LEFT HEART CATHETERIZATION WITH CORONARY ANGIOGRAM N/A 07/06/2011   Procedure: LEFT HEART CATHETERIZATION WITH CORONARY ANGIOGRAM;  Surgeon: Leonie Man, MD;  Location: [facility] CATH LAB: Unstable Angina --> Myoview wiht Inf-Lat Ischemia.  Proximal OM1 lesion --> PCI; 100% mid RCA with right to right and left to right bridging collaterals from circumflex RPL and LAD the PDA.  Marland Kitchen NM MYOVIEW LTD  October 2014    Low risk. Fixed basal inferior artifact normal EF. No ischemia --> (as compared to pre-PCI Myoview revealing inferolateral ischemia)  . TRANSTHORACIC ECHOCARDIOGRAM  11/2014   EF 65-70%, Mod LVH. Normal wall motion. Gr 1 DD. Normal valves.  Marland Kitchen VAGINAL HYSTERECTOMY      Prior to Admission medications   Medication Sig Start Date End Date Taking? Authorizing Provider  acetaminophen (TYLENOL) 650 MG  CR tablet Take 1,300 mg by mouth 2 (two) times daily as needed for pain.   Yes [provider]  ALPRAZolam Duanne Moron) 0.25 MG tablet TAKE 1 TABLET BY MOUTH AT BEDTIME AS NEEDED SLEEP 12/12/16  Yes Leone Haven, MD  docusate sodium (STOOL SOFTENER) 100 MG capsule Take 100 mg by mouth at bedtime.    Yes [provider]  fish oil-omega-3 fatty acids 1000 MG capsule Take 1 g by mouth daily.    Yes [provider]  furosemide (LASIX) 20 MG tablet Take 2 tablets (40 mg total) by mouth daily. 04/09/17  Yes Leonie Man, MD  gabapentin (NEURONTIN) 300 MG capsule Take 300 mg by mouth 3 (three) times daily. gabapentin 300 mg capsule  take one cap po X 1 week, then take one cap po bid X 1 week, then take one cap po tid and continue   Yes [provider]  Insulin Glargine  (LANTUS) 100 UNIT/ML Solostar Pen Inject 40 Units into the skin 2 (two) times daily.  03/31/15  Yes [provider]  insulin lispro (HUMALOG KWIKPEN) 100 UNIT/ML KiwkPen Inject 22 Units into the skin 3 (three) times daily as needed (CBG >200).    Yes [provider]  ipratropium (ATROVENT) 0.06 % nasal spray Place 1 spray into the nose daily.  12/08/10  Yes [provider]  levocetirizine (XYZAL) 5 MG tablet Take 5 mg by mouth every evening.  03/22/15  Yes [provider]  loratadine (CLARITIN) 10 MG tablet Take 10 mg by mouth daily.   Yes [provider]  LUMIGAN 0.01 % SOLN Place 1 drop into both eyes at bedtime.  03/06/15  Yes [provider]  Magnesium 500 MG CAPS Take 1,000 mg by mouth at bedtime.    Yes [provider]  metoprolol tartrate (LOPRESSOR) 25 MG tablet TAKE 1 TABLET BY MOUTH  TWICE DAILY. HOLD IF  SYSTOLIC BP IS UNDER 970  AND HEART RATE UNDER 60 12/26/16  Yes Leonie Man, MD  mirabegron ER (MYRBETRIQ) 50 MG TB24 tablet Take one tablet by mouth once daily for kidneys   Yes [provider]  nitroGLYCERIN (NITROSTAT) 0.4 MG SL tablet Place 0.4 mg under the tongue every 5 (five) minutes as needed for chest pain.   Yes [provider]  nystatin cream (MYCOSTATIN) Apply 1 application topically daily. 12/12/16  Yes Leone Haven, MD  pantoprazole (PROTONIX) 40 MG tablet Take 1 tablet (40 mg total) by mouth daily. 04/05/17  Yes Leone Haven, MD  PARoxetine (PAXIL) 10 MG tablet TAKE 1 TABLET BY MOUTH  DAILY 04/06/17  Yes Leone Haven, MD  rOPINIRole (REQUIP) 0.5 MG tablet TAKE 1 TABLET BY MOUTH AT  BEDTIME 12/26/16  Yes Leone Haven, MD  rosuvastatin (CRESTOR) 40 MG tablet TAKE 1 TABLET BY MOUTH  DAILY 12/26/16  Yes Leonie Man, MD  sitaGLIPtin (JANUVIA) 50 MG tablet Take 50 mg by mouth at bedtime.    Yes [provider]  traMADol (ULTRAM) 50 MG tablet Take 50 mg by mouth 2  (two) times daily.   Yes [provider]  VESICARE 5 MG tablet TAKE 1 TABLET (5 MG TOTAL) BY MOUTH ONCE DAILY. 03/30/15  Yes [provider]  XARELTO 15 MG TABS tablet TAKE 1 TABLET BY MOUTH  DAILY WITH SUPPER 03/14/17  Yes Leonie Man, MD  amoxicillin-clavulanate (AUGMENTIN) 875-125 MG tablet Take 1 tablet by mouth 2 (two) times daily for 10  days. 04/23/17 05/03/17  Rudene Re, MD  cephALEXin (KEFLEX) 500 MG capsule Take 1 capsule (500 mg total) by mouth 2 (two) times daily. Patient not taking: Reported on 04/23/2017 04/12/17   Delano Metz, FNP  ONE TOUCH ULTRA TEST test strip 1 each by Other route as needed.  03/18/15   [provider]    Allergies Amlodipine; Clopidogrel bisulfate; Kenalog [triamcinolone acetonide]; Lisinopril; Losartan; and Sulfa antibiotics  Family History  Problem Relation Age of Onset  . COPD Mother   . Pulmonary fibrosis Mother   . Heart attack Father   . Heart disease Father   . Stroke Father   . Cancer Sister   . Cancer Sister   . Clotting disorder Sister   . Heart attack Sister   . Arthritis Unknown   . Breast cancer Sister   . Colon cancer Neg Hx   . Esophageal cancer Neg Hx   . Stomach cancer Neg Hx     Social History Social History   Tobacco Use  . Smoking status: Never Smoker  . Smokeless tobacco: Never Used  Substance Use Topics  . Alcohol use: No  . Drug use: No    Review of Systems  Constitutional: Negative for fever. + syncope Eyes: Negative for visual changes. ENT: Negative for sore throat. Neck: No neck pain  Cardiovascular: Negative for chest pain. Respiratory: Negative for shortness of breath. Gastrointestinal: Negative for abdominal pain, vomiting or diarrhea. Genitourinary: Negative for dysuria. Musculoskeletal: Negative for back pain. Skin: Negative for rash. Neurological: Negative for headaches, weakness or numbness. Psych: No SI or  HI  ____________________________________________   PHYSICAL EXAM:  VITAL SIGNS: ED Triage Vitals  Enc Vitals Group     BP 04/23/17 1508 138/65     Pulse Rate 04/23/17 1508 69     Resp 04/23/17 1508 16     Temp 04/23/17 1508 (!) 97.2 F (36.2 C)     Temp Source 04/23/17 1508 Oral     SpO2 04/23/17 1508 94 %     Weight 04/23/17 1509 220 lb (99.8 kg)     Height 04/23/17 1509 5\' 4"  (1.626 m)     Head Circumference --      Peak Flow --      Pain Score --      Pain Loc --      Pain Edu? --      Excl. in [place]? --     Constitutional: Alert and oriented. Well appearing and in no apparent distress. HEENT:      Head: Normocephalic and atraumatic.         Eyes: Conjunctivae are normal. Sclera is non-icteric.       Mouth/Throat: Mucous membranes are moist.       Neck: Supple with no signs of meningismus. NO bruits Cardiovascular: Regular rate and rhythm. No murmurs, gallops, or rubs. 2+ symmetrical distal pulses are present in all extremities. No JVD. Respiratory: Normal respiratory effort. Lungs are clear to auscultation bilaterally. No wheezes, crackles, or rhonchi.  Gastrointestinal: Soft, non tender, and non distended with positive bowel sounds. No rebound or guarding. Musculoskeletal: Nontender with normal range of motion in all extremities. No edema, cyanosis, or erythema of extremities. Neurologic: Normal speech and language. A & O x3, PERRL, EOMI, no nystagmus, CN II-XII intact, motor testing reveals good tone and bulk throughout. There is no evidence of pronator drift or dysmetria. Muscle strength is 5/5 throughout. Sensory examination is intact. Gait is normal. Skin: Skin is warm, dry  and intact. No rash noted. Psychiatric: Mood and affect are normal. Speech and behavior are normal.  ____________________________________________   LABS (all labs ordered are listed, but only abnormal results are displayed)  Labs Reviewed  CBC WITH DIFFERENTIAL/PLATELET - Abnormal; Notable for  the following components:      Result Value   RDW 14.6 (*)    Neutro Abs 8.1 (*)    All other components within normal limits  BASIC METABOLIC PANEL - Abnormal; Notable for the following components:   Chloride 100 (*)    Glucose, Bld 205 (*)    BUN 28 (*)    Creatinine, Ser 1.16 (*)    GFR calc non Af Amer 42 (*)    GFR calc Af Amer 48 (*)    All other components within normal limits  URINALYSIS, COMPLETE (UACMP) WITH MICROSCOPIC - Abnormal; Notable for the following components:   Color, Urine YELLOW (*)    APPearance HAZY (*)    Leukocytes, UA TRACE (*)    Bacteria, UA RARE (*)    Squamous Epithelial / LPF 0-5 (*)    All other components within normal limits  URINE CULTURE  TROPONIN I   ____________________________________________  EKG  ED ECG REPORT I, Rudene Re, the attending physician, personally viewed and interpreted this ECG.  Normal sinus rhythm, 1st degree AV block, normal QRS and QTC intervals, normal axis, no STE or depressions, no evidence of HOCM, AV block, delta wave, ARVD, prolonged QTc, WPW, or Brugada.   ____________________________________________  RADIOLOGY  none  ____________________________________________   PROCEDURES  Procedure(s) performed: None Procedures Critical Care performed:  None ____________________________________________   INITIAL IMPRESSION / ASSESSMENT AND PLAN / ED COURSE  84 y.o. female with a history of CAD, CKD, DM, HTN, paroxysmal afib on Xarelto,syncope who presents for evaluation of syncopal episode while on the commode preceded by lightheadedness. patient is well-appearing, in no distress, vital signs are within normal limits, she is neurologically intact, no head trauma and no headache. Therefore the police patient warrants any imaging of her head at this time. We'll get an EKG to rule out dysrhythmia or ischemia. We'll get labs to rule out dehydration, electrolyte abnormalities, UTI. Patient has been seen in the  emergency department several times for similar with the last visit in November for which she was evaluated by me and at that time was found to have a UTI. Differential diagnoses including vasovagal versus orthostatic versus dehydration versus infection versus cardiac arrhythmia although less likely at this time will monitor on telemetry.    _________________________ 6:15 PM on 04/23/2017 -----------------------------------------  orthostatic vital signs are negative. labs showing no evidence of sepsis, anemia, electrolyte abnormalities, or dehydration.UA positive for urinary tract infection. Review of Epic shows that patient's most recent UTI grew Escherichia coli pansensitive. Patient finished a course of Keflex yesterday for UTI therefore we'll start her on Augmentin.patient was monitored in the emergency department for 3.5 hours with no evidence of cardiac arrhythmia. Attempt to ambulate in the ED with walker and patient is very weak and unsteady requiring assistance. Will discuss with Hospitalist for admission    As part of my medical decision making, I reviewed the following data within the [place] notes reviewed and incorporated, Labs reviewed , EKG interpreted , Old EKG reviewed, Old chart reviewed, Notes from prior ED visits and [place] Controlled Substance Database    Pertinent labs & imaging results that were available during my care of the patient were reviewed by me  and considered in my medical decision making (see chart for details).    ____________________________________________   FINAL CLINICAL IMPRESSION(S) / ED DIAGNOSES  Final diagnoses:  Syncope, unspecified syncope type  Acute cystitis with hematuria  Generalized weakness      NEW MEDICATIONS STARTED DURING THIS VISIT:  ED Discharge Orders        Ordered    amoxicillin-clavulanate (AUGMENTIN) 875-125 MG tablet  2 times daily     04/23/17 1814       Note:  This document was prepared  using Dragon voice recognition software and may include unintentional dictation errors.    Alfred Levins, [place], MD 04/23/17 [place], [place], MD 04/23/17 [phone]

## 2017-04-23 NOTE — ED Notes (Signed)
This RN and Dr Alfred Levins in to speak with pt in family. Off going RN went in to d/c pt and as she was getting into wheelchair, daughter and pt state that she cant go home this way because the daughter is unable to take care of her. Per her lab work, the pt baseline ambulation should not be altered. Pt is unable to bear weight on the right leg pr minimal at best. Pt was doing PT at home but completed it this week due to as the pt said "that girl said I didn't do everything she wanted". The house is not wheelchair accessible. The MD and myself prepared the daughter and pt that if she is unable to ambulate that a nursing home would be the next step if they are unable to care for her at home. Expressed to daughter that she needed to be prepared for whatever PT recommended from the hospital as she would only be admitted for possibly a night. Daughter and pt verbalized understanding.

## 2017-04-23 NOTE — ED Triage Notes (Signed)
Pt arrived via EMS from home d/t syncopal episode while walking to the bathroom. Pt reports dizziness while walking to the bathroom. Pt is A&Ox4 and denies any pain or dizziness at this time.

## 2017-04-24 ENCOUNTER — Observation Stay: Payer: Medicare Other

## 2017-04-24 DIAGNOSIS — N390 Urinary tract infection, site not specified: Secondary | ICD-10-CM | POA: Diagnosis not present

## 2017-04-24 DIAGNOSIS — I10 Essential (primary) hypertension: Secondary | ICD-10-CM | POA: Diagnosis not present

## 2017-04-24 DIAGNOSIS — E119 Type 2 diabetes mellitus without complications: Secondary | ICD-10-CM | POA: Diagnosis not present

## 2017-04-24 DIAGNOSIS — R55 Syncope and collapse: Secondary | ICD-10-CM | POA: Diagnosis not present

## 2017-04-24 LAB — CBC
HEMATOCRIT: 36 % (ref 35.0–47.0)
Hemoglobin: 12.1 g/dL (ref 12.0–16.0)
MCH: 31.3 pg (ref 26.0–34.0)
MCHC: 33.5 g/dL (ref 32.0–36.0)
MCV: 93.3 fL (ref 80.0–100.0)
Platelets: 268 10*3/uL (ref 150–440)
RBC: 3.85 MIL/uL (ref 3.80–5.20)
RDW: 14.4 % (ref 11.5–14.5)
WBC: 11.8 10*3/uL — ABNORMAL HIGH (ref 3.6–11.0)

## 2017-04-24 LAB — BASIC METABOLIC PANEL
Anion gap: 12 (ref 5–15)
BUN: 29 mg/dL — AB (ref 6–20)
CALCIUM: 9.3 mg/dL (ref 8.9–10.3)
CO2: 23 mmol/L (ref 22–32)
Chloride: 101 mmol/L (ref 101–111)
Creatinine, Ser: 1.09 mg/dL — ABNORMAL HIGH (ref 0.44–1.00)
GFR calc Af Amer: 52 mL/min — ABNORMAL LOW (ref >60)
GFR, EST NON AFRICAN AMERICAN: 45 mL/min — AB (ref >60)
GLUCOSE: 272 mg/dL — AB (ref 65–99)
Potassium: 3.9 mmol/L (ref 3.5–5.1)
Sodium: 136 mmol/L (ref 135–145)

## 2017-04-24 LAB — GLUCOSE, CAPILLARY
GLUCOSE-CAPILLARY: 238 mg/dL — AB (ref 65–99)
GLUCOSE-CAPILLARY: 290 mg/dL — AB (ref 65–99)
Glucose-Capillary: 314 mg/dL — ABNORMAL HIGH (ref 65–99)
Glucose-Capillary: 322 mg/dL — ABNORMAL HIGH (ref 65–99)

## 2017-04-24 MED ORDER — HYDROCODONE-ACETAMINOPHEN 5-325 MG PO TABS
1.0000 | ORAL_TABLET | ORAL | Status: DC | PRN
Start: 2017-04-24 — End: 2017-04-25
  Administered 2017-04-24 – 2017-04-25 (×4): 1 via ORAL
  Filled 2017-04-24 (×4): qty 1

## 2017-04-24 MED ORDER — AMOXICILLIN-POT CLAVULANATE 875-125 MG PO TABS
1.0000 | ORAL_TABLET | Freq: Two times a day (BID) | ORAL | Status: DC
  Administered 2017-04-25: 10:00:00 1 via ORAL
  Filled 2017-04-24: qty 1

## 2017-04-24 MED ORDER — LORATADINE 10 MG PO TABS
10.0000 mg | ORAL_TABLET | Freq: Every day | ORAL | Status: DC
  Administered 2017-04-24: 10 mg via ORAL
  Filled 2017-04-24: qty 1

## 2017-04-24 MED ORDER — SODIUM CHLORIDE 0.9 % IV SOLN
1.0000 g | Freq: Every day | INTRAVENOUS | Status: DC
  Administered 2017-04-24: 1 g via INTRAVENOUS
  Filled 2017-04-24: qty 10

## 2017-04-24 NOTE — Evaluation (Signed)
Physical Therapy Evaluation Patient Details Name: Abigail Boyd MRN: [id number] DOB: 04/24/1929 Today's Date: 04/24/2017   History of Present Illness  Pt is a 84 y/o F who presented with weakness and syncopal episode.  Pt was noted to have UTI.  Pt's orthostatic vital signs were normal. Pt's PMH includes CKD, glaucoma, migraines, stroke, tachy-brady syndrome, coronary angioplasty with stent placement, R knee arthroscopy with medial menisectomy.    Clinical Impression  Pt admitted with above diagnosis. Pt currently with functional limitations due to the deficits listed below (see PT Problem List). Abigail Boyd currently requires min assist for bed mobility, min guard for sit>stand and min guard to ambulate a short distance.  Pt with significantly decreased gait speed and is a higher risk of falling.  She currently does not have 24/7 assist/superivision at home.  Her daughter is at work during the day and the pt has a caregiver from 11am-5pm.  Given pt's current mobility status, recommending SNF at d/c.  However, if pt were able to have 24/7 assist/supervision then pt would be appropriate to return home at d/c. Pt will benefit from skilled PT to increase their independence and safety with mobility to allow discharge to the venue listed below.      Follow Up Recommendations SNF    Equipment Recommendations  None recommended by PT    Recommendations for Other Services       Precautions / Restrictions Precautions Precautions: Fall Restrictions Weight Bearing Restrictions: No      Mobility  Bed Mobility Overal bed mobility: Needs Assistance Bed Mobility: Supine to Sit     Supine to sit: Min assist     General bed mobility comments: Assist to bring RLE to EOB and to elevate trunk.  Pt relies heavily on bed rail.    Transfers Overall transfer level: Needs assistance Equipment used: Rolling walker (2 wheeled) Transfers: Sit to/from Stand Sit to Stand: Min guard         General  transfer comment: Pt very slow to stand and requires cues to stand upright as pt demonstrates flexed posture.  Orthostatic vital signs negative.   Ambulation/Gait Ambulation/Gait assistance: Min guard Ambulation Distance (Feet): 30 Feet Assistive device: Rolling walker (2 wheeled) Gait Pattern/deviations: Decreased stride length;Antalgic;Trunk flexed Gait velocity: very decreased Gait velocity interpretation: <1.8 ft/sec, indicative of risk for recurrent falls General Gait Details: Pt with very decreased gait speed and flexed posture which improves only temporarily with verbal cues.    Stairs            Wheelchair Mobility    Modified Rankin (Stroke Patients Only)       Balance Overall balance assessment: Needs assistance Sitting-balance support: No upper extremity supported;Feet supported Sitting balance-Leahy Scale: Good     Standing balance support: Bilateral upper extremity supported;During functional activity Standing balance-Leahy Scale: Poor Standing balance comment: Relies on UE support for static and dynamic activities                             Pertinent Vitals/Pain Pain Assessment: Faces Faces Pain Scale: Hurts even more Pain Location: R knee Pain Descriptors / Indicators: Aching;Guarding Pain Intervention(s): Limited activity within patient's tolerance;Monitored during session;Repositioned    Home Living Family/patient expects to be discharged to:: Private residence Living Arrangements: Children Available Help at Discharge: Family;Personal care attendant;Available PRN/intermittently Type of Home: House Home Access: Stairs to enter Entrance Stairs-Rails: Left;Right;Can reach both Entrance Stairs-Number of Steps: 2 Home Layout:  Two level;Able to live on main level with bedroom/bathroom Home Equipment: Toilet riser;Other (comment);Walker - 2 wheels(lift chair) Additional Comments: Lives with daughter.  Has personal care attendant 11am-5pm  each day.  Daughter goes to work during the day.  Pt does spend several hours a day.     Prior Function Level of Independence: Needs assistance   Gait / Transfers Assistance Needed: Pt was working with HHPT PTA.  Pt able to ambulate short household distances with RW.  No falls in the past 6 months.   ADL's / Homemaking Assistance Needed: Pt sleeps in lift chair.  Her caregiver assists with sponge baths and dressing.  Family and caregiver do the cooking, cleaning.          Hand Dominance        Extremity/Trunk Assessment   Upper Extremity Assessment Upper Extremity Assessment: (BUE strength grossly 4-/5)    Lower Extremity Assessment Lower Extremity Assessment: RLE deficits/detail;LLE deficits/detail RLE Deficits / Details: RLE strength grossly 3/5 LLE Deficits / Details: LLE strength grossly 4/5    Cervical / Trunk Assessment Cervical / Trunk Assessment: Kyphotic  Communication   Communication: No difficulties  Cognition Arousal/Alertness: Awake/alert Behavior During Therapy: WFL for tasks assessed/performed Overall Cognitive Status: Within Functional Limits for tasks assessed                                        General Comments      Exercises     Assessment/Plan    PT Assessment Patient needs continued PT services  PT Problem List Decreased strength;Decreased activity tolerance;Decreased balance;Decreased mobility;Decreased knowledge of use of DME;Decreased safety awareness;Pain       PT Treatment Interventions DME instruction;Gait training;Stair training;Functional mobility training;Therapeutic activities;Therapeutic exercise;Balance training;Neuromuscular re-education;Patient/family education;Wheelchair mobility training    PT Goals (Current goals can be found in the Care Plan section)  Acute Rehab PT Goals Patient Stated Goal: to get stronger PT Goal Formulation: With patient Time For Goal Achievement: 05/08/17 Potential to Achieve  Goals: Good    Frequency Min 2X/week   Barriers to discharge Inaccessible home environment;Decreased caregiver support Steps to enter home and pt alone several hours each day    Co-evaluation               AM-PAC PT "6 Clicks" Daily Activity  Outcome Measure Difficulty turning over in bed (including adjusting bedclothes, sheets and blankets)?: Unable Difficulty moving from lying on back to sitting on the side of the bed? : Unable Difficulty sitting down on and standing up from a chair with arms (e.g., wheelchair, bedside commode, etc,.)?: Unable Help needed moving to and from a bed to chair (including a wheelchair)?: A Little Help needed walking in hospital room?: A Little Help needed climbing 3-5 steps with a railing? : A Lot 6 Click Score: 11    End of Session Equipment Utilized During Treatment: Gait belt Activity Tolerance: Patient tolerated treatment well Patient left: in chair;with call bell/phone within reach;with chair alarm set Nurse Communication: Mobility status PT Visit Diagnosis: Muscle weakness (generalized) (M62.81);Unsteadiness on feet (R26.81);Other abnormalities of gait and mobility (R26.89);Difficulty in walking, not elsewhere classified (R26.2);Pain Pain - Right/Left: Right Pain - part of body: Knee    Time: 0928-0958 PT Time Calculation (min) (ACUTE ONLY): 30 min   Charges:   PT Evaluation $PT Eval Low Complexity: 1 Low PT Treatments $Gait Training: 8-22 mins   PT G  Codes:       Collie Siad PT, DPT 04/24/2017, 10:10 AM

## 2017-04-24 NOTE — Clinical Social Work Placement (Addendum)
2:01 PM Call placed to daughter, beds offers given. She is choosing between [facility] and [facility] and would like to speak to family members.  LCSW will call in the morning 2/27 regarding choice and beginning insurance authorization. [facility]  CLINICAL SOCIAL WORK PLACEMENT  NOTE  Date:  04/24/2017  Patient Details  Name: Abigail Boyd MRN: [id number] Date of Birth: [birth year]  Clinical Social Work is seeking post-discharge placement for this patient at the [place] level of care (*CSW will initial, date and re-position this form in  chart as items are completed):  Yes   Patient/family provided with [place] Work Department's list of facilities offering this level of care within the geographic area requested by the patient (or if unable, by the patient's family).  Yes   Patient/family informed of their freedom to choose among providers that offer the needed level of care, that participate in Medicare, Medicaid or managed care program needed by the patient, have an available bed and are willing to accept the patient.  Yes   Patient/family informed of [facility]'s ownership interest in [facility] and [facility], as well as of the fact that they are under no obligation to receive care at these facilities.  PASRR submitted to EDS on       PASRR number received on       Existing PASRR number confirmed on 04/24/17     FL2 transmitted to all facilities in geographic area requested by pt/family on 04/24/17     FL2 transmitted to all facilities within larger geographic area on       Patient informed that his/her managed care company has contracts with or will negotiate with certain facilities, including the following:            Patient/family informed of bed offers received. 04/24/2017  Patient chooses bed at       Physician recommends and patient chooses bed at      Patient to be transferred to   on  .  Patient to be transferred  to facility by       Patient family notified on   of transfer.  Name of family member notified:        PHYSICIAN Please sign DNR, Please sign FL2     Additional Comment:    _______________________________________________ [place], LCSW 04/24/2017, 10:16 AM

## 2017-04-24 NOTE — Progress Notes (Signed)
Pt complaining of pain to right knee.  Has hx of torn meniscus and arthritis.  Elevated knee with two pills and bed.  Pt had Ultram and then tylenol but reports still has pain of 5/10.  Text page to [facility] doc for new orders. Dorna Bloom RN

## 2017-04-24 NOTE — Progress Notes (Signed)
[place] [place]    MR#:  [id number]  DATE OF BIRTH:  [birth year]  SUBJECTIVE:  CHIEF COMPLAINT: Patient is resting comfortably.  Denies any nausea vomiting.  Denies any back pain  REVIEW OF SYSTEMS:  CONSTITUTIONAL: No fever, fatigue or weakness.  EYES: No blurred or double vision.  EARS, NOSE, AND THROAT: No tinnitus or ear pain.  RESPIRATORY: No cough, shortness of breath, wheezing or hemoptysis.  CARDIOVASCULAR: No chest pain, orthopnea, edema.  GASTROINTESTINAL: No nausea, vomiting, diarrhea or abdominal pain.  GENITOURINARY: No dysuria, hematuria.  ENDOCRINE: No polyuria, nocturia,  HEMATOLOGY: No anemia, easy bruising or bleeding SKIN: No rash or lesion. MUSCULOSKELETAL: No joint pain or arthritis.   NEUROLOGIC: No tingling, numbness, weakness.  PSYCHIATRY: No anxiety or depression.   DRUG ALLERGIES:   Allergies  Allergen Reactions  . Amlodipine Cough  . Clopidogrel Bisulfate Cough  . Kenalog [Triamcinolone Acetonide]     unknown  . Lisinopril Cough  . Losartan Cough  . Sulfa Antibiotics Hives and Rash    VITALS:  Blood pressure (!) 143/60, pulse 75, temperature (!) 97.3 F (36.3 C), temperature source Oral, resp. rate 20, height 5\' 4"  (1.626 m), weight 99.8 kg (220 lb), SpO2 96 %.  PHYSICAL EXAMINATION:  GENERAL:  84 y.o.-year-old patient lying in the bed with no acute distress.  EYES: Pupils equal, round, reactive to light and accommodation. No scleral icterus. Extraocular muscles intact.  HEENT: Head atraumatic, normocephalic. Oropharynx and nasopharynx clear.  NECK:  Supple, no jugular venous distention. No thyroid enlargement, no tenderness.  LUNGS: Normal breath sounds bilaterally, no wheezing, rales,rhonchi or crepitation. No use of accessory muscles of respiration.  CARDIOVASCULAR: S1, S2 normal. No murmurs, rubs, or gallops.  ABDOMEN: Soft, nontender, nondistended. Bowel sounds  present. No organomegaly or mass.  EXTREMITIES: No pedal edema, cyanosis, or clubbing.  NEUROLOGIC: Cranial nerves II through XII are intact. Muscle strength 5/5 in all extremities. Sensation intact. Gait not checked.  PSYCHIATRIC: The patient is alert and oriented x 3.  SKIN: No obvious rash, lesion, or ulcer.    LABORATORY PANEL:   CBC Recent Labs  Lab 04/24/17 0418  WBC 11.8*  HGB 12.1  HCT 36.0  PLT 268   ------------------------------------------------------------------------------------------------------------------  Chemistries  Recent Labs  Lab 04/24/17 0418  NA 136  K 3.9  CL 101  CO2 23  GLUCOSE 272*  BUN 29*  CREATININE 1.09*  CALCIUM 9.3   ------------------------------------------------------------------------------------------------------------------  Cardiac Enzymes Recent Labs  Lab 04/23/17 1514  TROPONINI <0.03   ------------------------------------------------------------------------------------------------------------------  RADIOLOGY:  Dg Chest 2 View  Result Date: 04/23/2017 CLINICAL DATA:  Weakness and near syncope EXAM: CHEST  2 VIEW COMPARISON:  11/18/2015 FINDINGS: No acute consolidation or effusion. Minimal atelectasis or scarring at the left base. Mild cardiomegaly. No pleural effusion. Degenerative changes of the spine. IMPRESSION: No active cardiopulmonary disease.  Borderline to mild cardiomegaly. Electronically Signed   By: Donavan Foil M.D.   On: 04/23/2017 22:03   US Renal  Result Date: 04/24/2017 CLINICAL DATA:  Urinary tract infections EXAM: RENAL / URINARY TRACT ULTRASOUND COMPLETE COMPARISON:  CT abdomen and pelvis January 29, 2007 FINDINGS: Right Kidney: Length: 10.4 cm. Echogenicity and renal cortical thickness are within normal limits. No mass, perinephric fluid, or hydronephrosis visualized. No sonographically demonstrable calculus or ureterectasis. Left Kidney: Length: 10.4 cm. Echogenicity and renal cortical thickness are  within normal limits. No mass, perinephric fluid, or hydronephrosis visualized. No sonographically  demonstrable calculus or ureterectasis. Bladder: Appears normal for degree of bladder distention. IMPRESSION: Study within normal limits. Electronically Signed   By: Lowella Grip III M.D.   On: 04/24/2017 09:03    EKG:   Orders placed or performed during the hospital encounter of 04/23/17  . ED EKG  . ED EKG  . EKG    ASSESSMENT AND PLAN:   * Syncopal episode   Monitor on telemetry ivf    physical therapy recs -SNF  * Recurrent UTI   She had Escherichia coli every time which was sensitive to most of all the antibiotic   Given Keflex in last admission and Escherichia coli was sensitive, received  Rocephin IV , will change to augmentin   pending  urine culture nml ultrasound on kidney urology seen pt  for recurrent UTI. OP f/u  * Diabetes   Continue long-acting insulin at lower dose and keep on sliding scale coverage.  * A. fib with history of stroke   Continue anticoagulation.  * Hypertension   Continue metoprolol  * Hyperlipidemia   Continue Crestor.   DISPO- SNF    All the records are reviewed and case discussed with Care Management/Social Workerr. Management plans discussed with the patient, family and they are in agreement.  CODE STATUS:  DNR TOTAL TIME TAKING CARE OF THIS PATIENT: 36 minutes.   POSSIBLE D/C IN  1-2  DAYS, DEPENDING ON CLINICAL CONDITION.  Note: This dictation was prepared with Dragon dictation along with smaller phrase technology. Any transcriptional errors that result from this process are unintentional.   Nicholes Mango M.D on 04/24/2017 at 1:52 PM  Between 7am to 6pm - Pager - [phone] After 6pm go to [email or web address] - password [email or web address] [facility]  [facility] [facility]  Office  [phone]  CC: Primary care physician; Leone Haven, MD

## 2017-04-24 NOTE — NC FL2 (Addendum)
[place] LEVEL OF CARE SCREENING TOOL     IDENTIFICATION  Patient Name: Abigail Boyd Birthdate: May 31, 1929 Sex: female Admission Date (Current Location): 04/23/2017  [place] and [place] Number:  Engineering geologist and Address:  [facility], [address], [place], [place] [address]      Provider Number: [id number]  Attending Physician Name and Address:  Nicholes Mango, MD  Relative Name and Phone Number:       Current Level of Care: Hospital Recommended Level of Care: [place] Prior Approval Number:    Date Approved/Denied:   PASRR Number:   [id number]  Discharge Plan: SNF    Current Diagnoses: Patient Active Problem List   Diagnosis Date Noted  . Acute lower UTI 04/23/2017  . Generalized weakness 04/23/2017  . Recurrent UTI 04/23/2017  . Dysuria 12/12/2016  . Atypical chest pain 12/12/2016  . Anxiety 12/13/2015  . Allergic rhinitis 12/13/2015  . Shortness of breath 11/18/2015  . Orthostatic hypotension 05/30/2015  . History of cardioembolic cerebrovascular accident (CVA) 05/24/2015  . Syncope 04/23/2015  . Urge incontinence 03/15/2015  . Bilateral lower extremity edema 03/15/2015  . Constipation 03/15/2015  . Right knee pain 03/15/2015  . Hematochezia 03/15/2015  . CKD (chronic kidney disease), stage II   . Osteoporosis screening 06/10/2014  . PLMD (periodic limb movement disorder) 06/12/2013  . Obstructive sleep apnea 03/05/2013  . Tachy-brady syndrome: Rates range from 50-169 bpm in A. fib 12/20/2012  . History of syncope: Unclear etiology 12/19/2012  . Chronotropic incompetence - partially medication related 08/18/2012  . PAF (paroxysmal atrial fibrillation) (HCC);  CHA2DS2-VASc Score =6 on 15 mg Xarelto 05/16/2012    Class: Diagnosis of  . Long term current use of anticoagulant therapy 05/16/2012  . CAD-PCI OM1, 100% RCA (L-R colllaterals) 07/07/2011  . Hypercholesteremia     Class: Diagnosis of   . Essential hypertension     Class: Diagnosis of  . Spinal stenosis     Class: History of  . Diabetes mellitus (HCC)     Class: Diagnosis of  . Unstable angina - history of; resolved, status post PCI of the OM 06/13/2011    Class: History of  . Benign endometrial hyperplasia 03/21/2011  . Diverticulitis 03/21/2011  . GERD (gastroesophageal reflux disease) 03/21/2011  . Glaucoma (increased eye pressure) 03/21/2011  . Goiter 03/21/2011  . Hemorrhoid 03/21/2011  . History of anemia 03/21/2011  . History of carpal tunnel syndrome 03/21/2011  . Osteopenia 03/21/2011  . Vitamin D deficiency disease 03/21/2011    Orientation RESPIRATION BLADDER Height & Weight     Self, Time, Situation, Place  Normal Incontinent Weight: 220 lb (99.8 kg) Height:  5\' 4"  (162.6 cm)  BEHAVIORAL SYMPTOMS/MOOD NEUROLOGICAL BOWEL NUTRITION STATUS      Continent Diet(See DC summary)  AMBULATORY STATUS COMMUNICATION OF NEEDS Skin   Extensive Assist Verbally Normal                       Personal Care Assistance Level of Assistance  Bathing, Feeding, Dressing Bathing Assistance: Limited assistance Feeding assistance: Independent Dressing Assistance: Limited assistance     Functional Limitations Info  Sight, Hearing, Speech Sight Info: Adequate Hearing Info: Adequate Speech Info: Adequate    SPECIAL CARE FACTORS FREQUENCY  PT (By licensed PT), OT (By licensed OT)     PT Frequency: 5x/week OT Frequency: 5x/week            Contractures Contractures Info: Not present  Additional Factors Info  Code Status, Allergies Code Status Info: DNR Allergies Info:  Amlodipine, Clopidogrel Bisulfate, Kenalog Triamcinolone Acetonide, Lisinopril, Losartan, Sulfa Antibiotics           Current Medications (04/24/2017):  This is the current hospital active medication list Current Facility-Administered Medications  Medication Dose Route Frequency Provider Last Rate Last Dose  . acetaminophen  (TYLENOL) tablet 650 mg  650 mg Oral TID PRN [name], MD   650 mg at 04/24/17 0409  . ALPRAZolam Duanne Moron) tablet 0.25 mg  0.25 mg Oral QHS PRN [name], MD   0.25 mg at 04/23/17 2327  . cefTRIAXone (ROCEPHIN) 1 g in sodium chloride 0.9 % 100 mL IVPB  1 g Intravenous q1800 Kenosha Doster, MD 200 mL/hr at 04/24/17 1009 1 g at 04/24/17 1009  . docusate sodium (COLACE) capsule 100 mg  100 mg Oral QHS [name], MD   100 mg at 04/23/17 2328  . docusate sodium (COLACE) capsule 100 mg  100 mg Oral BID PRN [name], MD      . furosemide (LASIX) tablet 40 mg  40 mg Oral Daily [name], MD   40 mg at 04/24/17 1004  . gabapentin (NEURONTIN) capsule 300 mg  300 mg Oral TID [name], MD   300 mg at 04/24/17 1004  . HYDROcodone-acetaminophen (NORCO/VICODIN) 5-325 MG per tablet 1 tablet  1 tablet Oral Q4H PRN Harrie Foreman, MD   1 tablet at 04/24/17 0654  . insulin aspart (novoLOG) injection 0-9 Units  0-9 Units Subcutaneous TID WC [name], MD   3 Units at 04/24/17 0747  . insulin glargine (LANTUS) injection 25 Units  25 Units Subcutaneous BID [name], MD   25 Units at 04/24/17 0004  . ipratropium (ATROVENT) 0.06 % nasal spray 1 spray  1 spray Nasal Daily [name], MD      . latanoprost (XALATAN) 0.005 % ophthalmic solution 1 drop  1 drop Both Eyes QHS [name], MD   1 drop at 04/24/17 0004  . levocetirizine (XYZAL) tablet 5 mg  5 mg Oral QPM [name], MD      . linagliptin (TRADJENTA) tablet 5 mg  5 mg Oral Daily [name], MD   5 mg at 04/24/17 1004  . loratadine (CLARITIN) tablet 10 mg  10 mg Oral Daily [name], MD   10 mg at 04/24/17 1004  . metoprolol tartrate (LOPRESSOR) tablet 12.5 mg  12.5 mg Oral BID [name], MD   12.5 mg at 04/24/17 1005  . mirabegron ER (MYRBETRIQ) tablet 25 mg  25 mg Oral Daily [name], MD   25 mg at 04/24/17 1005  . nitroGLYCERIN (NITROSTAT) SL tablet 0.4 mg  0.4 mg Sublingual Q5 min PRN [name], MD      . omega-3 acid ethyl esters (LOVAZA) capsule 1 g  1 g Oral BID [name], MD   1 g at 04/24/17 1004  . pantoprazole (PROTONIX) EC tablet 40 mg  40 mg Oral Daily [name], MD   40 mg at 04/24/17 1004  . PARoxetine (PAXIL) tablet 10 mg  10 mg Oral Daily [name], MD   10 mg at 04/24/17 1004  . Rivaroxaban (XARELTO) tablet 15 mg  15 mg Oral Q supper [name], MD      . rosuvastatin (CRESTOR) tablet 40 mg  40 mg Oral Daily [name], MD         Discharge Medications: Please see discharge summary for  a list of discharge medications.  Relevant Imaging Results:  Relevant Lab Results:   Additional Information SSN:  [id number]  [place], [place]

## 2017-04-24 NOTE — Progress Notes (Signed)
Abigail Boyd has a history of recurrent UTIs and overactive bladder with urge incontinence.  Over the past few months she has had recurrent E. coli infections.  She was admitted on 2/25 with weakness and a near syncopal episode.  She has previously been evaluated by [facility].  She has been on Myrbetriq and solifenacin for overactive bladder.  She had a history of gross hematuria and 2017 and CT and cystoscopy is recommended however she and her family declined.  A renal ultrasound performed during his hospitalization showed no significant abnormalities.  She has also been on vaginal estrogen.  Would recommend treatment of her acute infection and follow-up appointment as an outpatient.  She does not desire to return to [facility] due to transportation and will be happy to see her in [place].

## 2017-04-24 NOTE — Clinical Social Work Note (Signed)
Clinical Social Work Assessment  Patient Details  Name: Abigail Boyd MRN: [id number] Date of Birth: 1930/02/06  Date of referral:  04/24/17               Reason for consult:  Facility Placement, Discharge Planning                Permission sought to share information with:  Case Manager, Customer service manager, Family Supports Permission granted to share information::  Yes, Verbal Permission Granted  Name::        Agency::     Relationship::  Daughter: Surveyor, quantity Information:     Housing/Transportation Living arrangements for the past 2 months:  Single Family Home Source of Information:  Patient, Medical Team, Adult Children Patient Interpreter Needed:  None Criminal Activity/Legal Involvement Pertinent to Current Situation/Hospitalization:  No - Comment as needed Significant Relationships:  Adult Children, Warehouse manager, Other Family Members Lives with:  Adult Children(lives with daughter and has additional care givers) Do you feel safe going back to the place where you live?  No Need for family participation in patient care:  Yes (Comment)  Care giving concerns:  Patient admitted to [facility] via EMS from home d/t syncopal episode while walking to the bathroom. Pt reports dizziness while walking to the bathroom.  Abigail Boyd  is a 84 y.o. female with a known history of anxiety, bilateral cataracts, coronary artery disease, status post PCI, chronic kidney disease stage II, diverticulitis, essential hypertension, glaucoma, gastroesophageal reflux disease, paroxysmal atrial fibrillation, stroke- lives at home with her daughter and having recurrent infections with UTI in last few weeks. Treated for UTI with Keflex recently from ER. Her Escherichia coli in urine culture was sensitive to that also. Again today when she was in the bathroom she felt very weak and had a syncopal episode and she could not stand up from there, so her daughter called ambulance and brought her to  emergency room.  Patient lives with her daughter and also has additional caregivers, but due to recurrent UTIs and weakness, patient is unable to move around at baseline per daughter. Daughter reports patient was slow, but could get up and take herself to the bathroom and she is just limited at this time. Agreeable for SNF to increase safety.  Social Worker assessment / plan:  Assessment completed with daughter via phone. Daughter reports she wants her mother home, however needs her more at baseline and little more independent due to her working during the day. Reports she was at [facility] in 2017 and open to SNF bed search with preference in [place], but no specific bed choice.  Plan: Completed SNF work up Will present bed offers when available Will need insurance auth prior to DC.  Anticipate SNF at DC unless course of treatment changes. Will continue to follow.   Employment status:  Retired Nurse, adult PT Recommendations:  [place] / Referral to community resources:  [place]  Patient/Family's Response to care:  Understanding and agreeable  Patient/Family's Understanding of and Emotional Response to Diagnosis, Current Treatment, and Prognosis:  Daughter voices concerns with patient returning home as her baseline is not met. SHe voices understanding of safety and agreeable to SNF.  Emotional Assessment Appearance:  Appears stated age Attitude/Demeanor/Rapport:    Affect (typically observed):  Accepting, Adaptable Orientation:  Oriented to Place, Oriented to Self, Oriented to  Time, Oriented to Situation Alcohol / Substance use:  Not Applicable Psych involvement (Current  and /or in the community):  No (Comment)  Discharge Needs  Concerns to be addressed:  Adjustment to Illness, Care Coordination Readmission within the last 30 days:  No Current discharge risk:  None Barriers to Discharge:  Psychologist, clinical, Continued Medical Work up   Marshell Garfinkel 04/24/2017, 10:17 AM

## 2017-04-25 DIAGNOSIS — Z87440 Personal history of urinary (tract) infections: Secondary | ICD-10-CM | POA: Diagnosis not present

## 2017-04-25 DIAGNOSIS — I495 Sick sinus syndrome: Secondary | ICD-10-CM | POA: Diagnosis not present

## 2017-04-25 DIAGNOSIS — Z794 Long term (current) use of insulin: Secondary | ICD-10-CM | POA: Diagnosis not present

## 2017-04-25 DIAGNOSIS — Z8673 Personal history of transient ischemic attack (TIA), and cerebral infarction without residual deficits: Secondary | ICD-10-CM | POA: Diagnosis not present

## 2017-04-25 DIAGNOSIS — E1122 Type 2 diabetes mellitus with diabetic chronic kidney disease: Secondary | ICD-10-CM | POA: Diagnosis not present

## 2017-04-25 DIAGNOSIS — Z955 Presence of coronary angioplasty implant and graft: Secondary | ICD-10-CM | POA: Diagnosis not present

## 2017-04-25 DIAGNOSIS — G4733 Obstructive sleep apnea (adult) (pediatric): Secondary | ICD-10-CM | POA: Diagnosis not present

## 2017-04-25 DIAGNOSIS — I480 Paroxysmal atrial fibrillation: Secondary | ICD-10-CM | POA: Diagnosis not present

## 2017-04-25 DIAGNOSIS — N3281 Overactive bladder: Secondary | ICD-10-CM | POA: Diagnosis not present

## 2017-04-25 DIAGNOSIS — Z79899 Other long term (current) drug therapy: Secondary | ICD-10-CM | POA: Diagnosis not present

## 2017-04-25 DIAGNOSIS — N182 Chronic kidney disease, stage 2 (mild): Secondary | ICD-10-CM | POA: Diagnosis not present

## 2017-04-25 DIAGNOSIS — E785 Hyperlipidemia, unspecified: Secondary | ICD-10-CM | POA: Diagnosis not present

## 2017-04-25 DIAGNOSIS — I10 Essential (primary) hypertension: Secondary | ICD-10-CM | POA: Diagnosis not present

## 2017-04-25 DIAGNOSIS — Z66 Do not resuscitate: Secondary | ICD-10-CM | POA: Diagnosis not present

## 2017-04-25 DIAGNOSIS — R531 Weakness: Secondary | ICD-10-CM | POA: Diagnosis not present

## 2017-04-25 DIAGNOSIS — Z888 Allergy status to other drugs, medicaments and biological substances status: Secondary | ICD-10-CM | POA: Diagnosis not present

## 2017-04-25 DIAGNOSIS — I2510 Atherosclerotic heart disease of native coronary artery without angina pectoris: Secondary | ICD-10-CM | POA: Diagnosis not present

## 2017-04-25 DIAGNOSIS — M48061 Spinal stenosis, lumbar region without neurogenic claudication: Secondary | ICD-10-CM | POA: Diagnosis not present

## 2017-04-25 DIAGNOSIS — H409 Unspecified glaucoma: Secondary | ICD-10-CM | POA: Diagnosis not present

## 2017-04-25 DIAGNOSIS — G2581 Restless legs syndrome: Secondary | ICD-10-CM | POA: Diagnosis not present

## 2017-04-25 DIAGNOSIS — E119 Type 2 diabetes mellitus without complications: Secondary | ICD-10-CM | POA: Diagnosis not present

## 2017-04-25 DIAGNOSIS — Z7901 Long term (current) use of anticoagulants: Secondary | ICD-10-CM | POA: Diagnosis not present

## 2017-04-25 DIAGNOSIS — G43909 Migraine, unspecified, not intractable, without status migrainosus: Secondary | ICD-10-CM | POA: Diagnosis not present

## 2017-04-25 DIAGNOSIS — R55 Syncope and collapse: Secondary | ICD-10-CM | POA: Diagnosis not present

## 2017-04-25 DIAGNOSIS — J449 Chronic obstructive pulmonary disease, unspecified: Secondary | ICD-10-CM | POA: Diagnosis not present

## 2017-04-25 DIAGNOSIS — G4700 Insomnia, unspecified: Secondary | ICD-10-CM | POA: Diagnosis not present

## 2017-04-25 DIAGNOSIS — N3091 Cystitis, unspecified with hematuria: Secondary | ICD-10-CM | POA: Diagnosis not present

## 2017-04-25 DIAGNOSIS — N183 Chronic kidney disease, stage 3 (moderate): Secondary | ICD-10-CM | POA: Diagnosis not present

## 2017-04-25 DIAGNOSIS — N390 Urinary tract infection, site not specified: Secondary | ICD-10-CM | POA: Diagnosis not present

## 2017-04-25 DIAGNOSIS — M1990 Unspecified osteoarthritis, unspecified site: Secondary | ICD-10-CM | POA: Diagnosis not present

## 2017-04-25 DIAGNOSIS — R110 Nausea: Secondary | ICD-10-CM | POA: Diagnosis not present

## 2017-04-25 DIAGNOSIS — I4891 Unspecified atrial fibrillation: Secondary | ICD-10-CM | POA: Diagnosis not present

## 2017-04-25 DIAGNOSIS — Z882 Allergy status to sulfonamides status: Secondary | ICD-10-CM | POA: Diagnosis not present

## 2017-04-25 DIAGNOSIS — I129 Hypertensive chronic kidney disease with stage 1 through stage 4 chronic kidney disease, or unspecified chronic kidney disease: Secondary | ICD-10-CM | POA: Diagnosis not present

## 2017-04-25 DIAGNOSIS — K219 Gastro-esophageal reflux disease without esophagitis: Secondary | ICD-10-CM | POA: Diagnosis not present

## 2017-04-25 DIAGNOSIS — E1140 Type 2 diabetes mellitus with diabetic neuropathy, unspecified: Secondary | ICD-10-CM | POA: Diagnosis not present

## 2017-04-25 DIAGNOSIS — M8580 Other specified disorders of bone density and structure, unspecified site: Secondary | ICD-10-CM | POA: Diagnosis not present

## 2017-04-25 LAB — GLUCOSE, CAPILLARY
GLUCOSE-CAPILLARY: 249 mg/dL — AB (ref 65–99)
Glucose-Capillary: 357 mg/dL — ABNORMAL HIGH (ref 65–99)
Glucose-Capillary: 314 mg/dL — ABNORMAL HIGH (ref 65–99)

## 2017-04-25 LAB — BASIC METABOLIC PANEL
Anion gap: 11 (ref 5–15)
BUN: 27 mg/dL — AB (ref 6–20)
CHLORIDE: 100 mmol/L — AB (ref 101–111)
CO2: 25 mmol/L (ref 22–32)
Calcium: 9.5 mg/dL (ref 8.9–10.3)
Creatinine, Ser: 1.15 mg/dL — ABNORMAL HIGH (ref 0.44–1.00)
GFR calc Af Amer: 49 mL/min — ABNORMAL LOW (ref >60)
GFR calc non Af Amer: 42 mL/min — ABNORMAL LOW (ref >60)
Glucose, Bld: 252 mg/dL — ABNORMAL HIGH (ref 65–99)
POTASSIUM: 4 mmol/L (ref 3.5–5.1)
SODIUM: 136 mmol/L (ref 135–145)

## 2017-04-25 MED ORDER — INSULIN GLARGINE 100 UNIT/ML SOLOSTAR PEN: 30.0000 [IU] | PEN_INJECTOR | Freq: Two times a day (BID) | SUBCUTANEOUS | 11 refills | Status: DC

## 2017-04-25 MED ORDER — INSULIN GLARGINE 100 UNIT/ML ~~LOC~~ SOLN
30.0000 [IU] | Freq: Two times a day (BID) | SUBCUTANEOUS | Status: DC
  Filled 2017-04-25 (×2): qty 0.3

## 2017-04-25 MED ORDER — TRAMADOL HCL 50 MG PO TABS: 50.0000 mg | ORAL_TABLET | Freq: Two times a day (BID) | ORAL | 0 refills | Status: DC

## 2017-04-25 MED ORDER — POLYETHYLENE GLYCOL 3350 17 G PO PACK: 17.0000 g | PACK | Freq: Every day | ORAL | 0 refills | Status: DC | PRN

## 2017-04-25 MED ORDER — ALPRAZOLAM 0.25 MG PO TABS: ORAL_TABLET | ORAL | 0 refills | Status: DC

## 2017-04-25 MED ORDER — POLYETHYLENE GLYCOL 3350 17 G PO PACK
17.0000 g | PACK | Freq: Every day | ORAL | Status: DC | PRN
  Administered 2017-04-25: 17 g via ORAL
  Filled 2017-04-25: qty 1

## 2017-04-25 MED ORDER — AMOXICILLIN-POT CLAVULANATE 875-125 MG PO TABS: 1.0000 | ORAL_TABLET | Freq: Two times a day (BID) | ORAL | 0 refills | Status: DC

## 2017-04-25 MED ORDER — INSULIN ASPART 100 UNIT/ML ~~LOC~~ SOLN: 0.0000 [IU] | Freq: Three times a day (TID) | SUBCUTANEOUS | 11 refills | Status: DC

## 2017-04-25 MED ORDER — POLYETHYLENE GLYCOL 3350 17 G PO PACK: 17.0000 g | PACK | Freq: Every day | ORAL | Status: DC

## 2017-04-25 MED ORDER — METOPROLOL TARTRATE 25 MG PO TABS: 12.5000 mg | ORAL_TABLET | Freq: Two times a day (BID) | ORAL | 0 refills | Status: DC

## 2017-04-25 NOTE — Progress Notes (Signed)
Inpatient Diabetes Program Recommendations  AACE/ADA: New Consensus Statement on Inpatient Glycemic Control (2015)  Target Ranges:  Prepandial:   less than 140 mg/dL      Peak postprandial:   less than 180 mg/dL (1-2 hours)      Critically ill patients:  140 - 180 mg/dL   Results for ELLYSSA, ZAGAL (MRN [id number]) as of 04/25/2017 08:27  Ref. Range 04/24/2017 07:26 04/24/2017 11:45 04/24/2017 16:49 04/24/2017 22:03 04/25/2017 08:10  Glucose-Capillary Latest Ref Range: 65 - 99 mg/dL 238 (H) 290 (H) 314 (H) 322 (H) 249 (H)   Review of Glycemic Control  Diabetes history: DM2 Outpatient Diabetes medications: Lantus 40 units BID, Humalog 22 units TID with meals, Januvia 50 mg QHS Current orders for Inpatient glycemic control: Lantus 25 units BID, Novolog 0-9 units TID with meals, Tradjenta 5 mg daily  Inpatient Diabetes Program Recommendations: Insulin - Basal: Please consider increasing Lantus to 30 units BID. Correction (SSI): Please consider adding Novolog 0-5 units QHS for bedtime correction. Insulin - Meal Coverage: Please consider ordering Novolog 4 units TID with meals for meal coverage if patient eats at least 50% of meals.  Thanks, Barnie Alderman, RN, MSN, CDE Diabetes Coordinator Inpatient Diabetes Program [phone] (Team Pager from 8am to 5pm)

## 2017-04-25 NOTE — Progress Notes (Signed)
Pt discharged via non-emergent [facility] to [facility]. Report called to accepting RN. IV removed, pt on room air and in no distress. Klare Criss S Fenton, RN 

## 2017-04-25 NOTE — Clinical Social Work Note (Addendum)
CSW spoke with patient's daughter [name] [phone], and provided bed offers.  Patient's daughter stated she would prefer a private room, CSW informed her that [facility] does not have any beds available, but [facility] can accept patient.  CSW spoke to patient as well and she agreed to going to [facility], [facility] has started [facility].  CSW to continue to follow patient's progress throughout discharge planning. CSW received phone call that SNF has received insurance auth.  Patient to be d/c'ed today to [facility]. Patient and family agreeable to plans will transport via ems RN to call report [phone].  CSW updated patient's daughter [name] [phone].   Jones Broom. [place], MSW, [place]  04/25/2017 12:55 PM

## 2017-04-25 NOTE — Discharge Instructions (Signed)
°  Follow-up with PCP in 3-5 days Follow-up with urology [facility]  Or [facility] in 1 week    You have been seen today in the Emergency Department (ED)  for syncope (passing out).  Your workup including labs and EKG did not show a cause for your syncope and were overall reassuring.    Your symptoms may be due to dehydration, so it is important that you drink plenty of non-alcoholic fluids. Emotional stress, pain, or overheating--especially if you have been standing--can make you faint. In these cases, fainting is usually not serious. But fainting can be a sign of a more serious problem. Therefore it is imperative that you follow up with your doctor in 1-2 days for further evaluation.  When should you call for help?  Call 911 anytime you think you may need emergency care. For example, call if:   You have symptoms of a heart problem. These may include:   Chest pain or pressure.   Severe trouble breathing.   A fast or irregular heartbeat.   Lightheadedness or sudden weakness.   Coughing up pink, foamy mucus.   Passing out.  You have symptoms of a stroke. These may include:   Sudden numbness, tingling, weakness, or loss of movement in your face, arm, or leg, especially on only one side of your body.   Sudden vision changes.   Sudden trouble speaking.   Sudden confusion or trouble understanding simple statements.   Sudden problems with walking or balance.   A sudden, severe headache that is different from past headaches.  You passed out (lost consciousness) again.  Watch closely for changes in your health, and be sure to contact your doctor if:   You do not get better as expected.   How can you care for yourself at home?   Drink plenty of fluids to prevent dehydration. If you have kidney, heart, or liver disease and have to limit fluids, talk with your doctor before you increase your fluid intake.

## 2017-04-25 NOTE — Clinical Social Work Placement (Signed)
   CLINICAL SOCIAL WORK PLACEMENT  NOTE  Date:  04/25/2017  Patient Details  Name: Abigail Boyd MRN: [id number] Date of Birth: September 03, 1929  Clinical Social Work is seeking post-discharge placement for this patient at the [place] level of care (*CSW will initial, date and re-position this form in  chart as items are completed):  Yes   Patient/family provided with [place] Work Department's list of facilities offering this level of care within the geographic area requested by the patient (or if unable, by the patient's family).  Yes   Patient/family informed of their freedom to choose among providers that offer the needed level of care, that participate in Medicare, Medicaid or managed care program needed by the patient, have an available bed and are willing to accept the patient.  Yes   Patient/family informed of [facility]'s ownership interest in [facility] and [facility] as well as of the fact that they are under no obligation to receive care at these facilities.  PASRR submitted to EDS on       PASRR number received on       Existing PASRR number confirmed on 04/24/17     FL2 transmitted to all facilities in geographic area requested by pt/family on 04/24/17     FL2 transmitted to all facilities within larger geographic area on       Patient informed that his/her managed care company has contracts with or will negotiate with certain facilities, including the following:        Yes   Patient/family informed of bed offers received.  Patient chooses bed at [facility]     Physician recommends and patient chooses bed at      Patient to be transferred to [facility] on 04/25/17.  Patient to be transferred to facility by [facility]     Patient family notified on   of transfer.  Name of family member notified:  Patient's daughter [name].     PHYSICIAN Please sign DNR, Please sign FL2      Additional Comment:    _______________________________________________ Ross Ludwig, LCSWA 04/25/2017, 4:11 PM

## 2017-04-25 NOTE — Discharge Summary (Addendum)
[place] at [employer] NAME: Abigail Boyd    MR#:  [id number]  DATE OF BIRTH:  May 23, 1929  DATE OF ADMISSION:  04/23/2017 ADMITTING PHYSICIAN: [name], MD  DATE OF DISCHARGE:  04/25/17   PRIMARY CARE PHYSICIAN: Leone Haven, MD    ADMISSION DIAGNOSIS:  UTI (urinary tract infection) [N39.0] Weakness [R53.1] Acute cystitis with hematuria [N30.01] Generalized weakness [R53.1] Syncope, unspecified syncope type [R55]  DISCHARGE DIAGNOSIS:  Principal Problem:   Acute lower UTI Active Problems:   Generalized weakness   Recurrent UTI   SECONDARY DIAGNOSIS:   Past Medical History:  Diagnosis Date  . Abnormal nuclear stress test 06/21/2011   Inferolateral reversible defect;--> cardiac cath & PCI of CxOM, occluded RCA with collaterals;; followup Myoview 11/2012: Low risk. Fixed basal inferior artifact normal EF. No ischemia  . Anxiety    When necessary Xanax  . Bilateral cataracts    Status post stroke or correction  . CAD S/P percutaneous coronary angioplasty 06/2011   s/P PCI to proximal OM1 w/ DES; occluded RCA with bridging and L-R collaterals (medical management)  . Chronic kidney disease (CKD), stage II (mild)    Related to current bladder infections (although diabetes cannot be excluded)  . Chronotropic incompetence with sinus node dysfunction [facility]) October 2013   On CPET test; beta blockers reduced  . Diabetes mellitus type 2, controlled ([place])    On oral medications  . Diverticulitis   . Essential hypertension    Allowing for permissive hypertension to avoid orthostatic hypotension  . GERD (gastroesophageal reflux disease)    On PPI  . Glaucoma   . History of syncope    Per EP - neurocardiogenic & not Bradycardia related (no PPM)  . History of unstable angina 06/13/2011   Jaw pain awakening from sleep -- Myoview --CATH --> PCI  . Hyperlipidemia with target LDL less than 70    HDL at goal, LDL not at  goal, borderline triglycerides. On Crestor 20 mg  . Migraines   . OSA (obstructive sleep apnea)    hx bladder infections  . Osteoarthritis   . PAF (paroxysmal atrial fibrillation) ([place]) 10-11/ 2014   CardioNet Event Monitor: NSR & S Brady -- Rates 50-100; Total A. fib burden 35 hours and 27 minutes. 1296 episodes, longest was 1 hour 29 minutes. Rate ranged from 52-169 beats per minute.  . Seasonal allergies   . Shortness of breath on exertion October 2013   2-D echo: Normal EF>55%, Gr 1DD, mild aortic sclerosis; Evaluated with CPET - peak VO2 97%; Chronotropic Incompetence (submaximal effort)  . Spinal stenosis of lumbar region 11/2011  . Stroke ([place])   . Tachycardia-bradycardia syndrome ([place]) 11/2012  . Urge incontinence     HOSPITAL COURSE:   HISTORY OF PRESENT ILLNESS: Abigail Boyd  is a 84 y.o. female with a known history of anxiety, bilateral cataracts, coronary artery disease, status post PCI, chronic kidney disease stage II, diverticulitis, essential hypertension, glaucoma, gastroesophageal reflux disease, paroxysmal atrial fibrillation, stroke- lives at home with her daughter and having recurrent infections with UTI in last few weeks. Treated for UTI with Keflex recently from ER. Her Escherichia coli in urine culture was sensitive to that also. Again today when she was in the bathroom she felt very weak and had a syncopal episode and she could not stand up from there, so her daughter called ambulance and brought her to emergency room. Patient denies any associated symptoms and she was noted  to have UTI again so ER physician tried to discharge her home with some oral antibiotics, but when she tried to stand her up she could barely stand up or walk. She checked her orthostatic vital signs and they were stable but patient could not stay up or walk so cannot go home, given to hospitalist team for further management.   *Syncopal episode Monitor on telemetry no arrhythmia noticed, not  orthostatic ivf  provided    physical therapy recs -SNF  *Recurrent UTI with a history of overactive bladder without incontinence She had Escherichia coli every time which was sensitive to most of all the antibiotic Given Keflex in last admission and Escherichia coli was sensitive, received  Rocephin IV during this admission, will change to augmentin  pending  urine culture showing gram-negative rods PCP to follow-up on the final sensitivity nml ultrasound on kidney urology seen pt  for recurrent UTI. OP f/u with [facility] or [place] [facility] as patient was seen by [facility] group in the past at patient's discretion  *Diabetes Continue long-acting insulin at lower dose and keep on sliding scale coverage.  *A. fib with history of stroke Continue anticoagulation.  *Hypertension Continue metoprolol  *Hyperlipidemia Continue Crestor.   DISPO- SNF    DISCHARGE CONDITIONS:   FAIR  CONSULTS OBTAINED:  Treatment Team:  Abbie Sons, MD   PROCEDURES NONE   DRUG ALLERGIES:   Allergies  Allergen Reactions  . Amlodipine Cough  . Clopidogrel Bisulfate Cough  . Kenalog [Triamcinolone Acetonide]     unknown  . Lisinopril Cough  . Losartan Cough  . Sulfa Antibiotics Hives and Rash    DISCHARGE MEDICATIONS:   Allergies as of 04/25/2017      Reactions   Amlodipine Cough   Clopidogrel Bisulfate Cough   Kenalog [triamcinolone Acetonide]    unknown   Lisinopril Cough   Losartan Cough   Sulfa Antibiotics Hives, Rash      Medication List    STOP taking these medications   cephALEXin 500 MG capsule Commonly known as:  KEFLEX   HUMALOG KWIKPEN 100 UNIT/ML KiwkPen Generic drug:  insulin lispro   levocetirizine 5 MG tablet Commonly known as:  XYZAL     TAKE these medications   acetaminophen 650 MG CR tablet Commonly known as:  TYLENOL Take 1,300 mg by mouth 2 (two) times daily as needed for pain.   ALPRAZolam 0.25 MG  tablet Commonly known as:  XANAX TAKE 1 TABLET BY MOUTH AT BEDTIME AS NEEDED SLEEP   amoxicillin-clavulanate 875-125 MG tablet Commonly known as:  AUGMENTIN Take 1 tablet by mouth 2 (two) times daily for 10 days.   fish oil-omega-3 fatty acids 1000 MG capsule Take 1 g by mouth daily.   furosemide 20 MG tablet Commonly known as:  LASIX Take 2 tablets (40 mg total) by mouth daily.   gabapentin 300 MG capsule Commonly known as:  NEURONTIN Take 300 mg by mouth 3 (three) times daily. gabapentin 300 mg capsule  take one cap po X 1 week, then take one cap po bid X 1 week, then take one cap po tid and continue   insulin aspart 100 UNIT/ML injection Commonly known as:  novoLOG Inject 0-9 Units into the skin 3 (three) times daily with meals. CBG < 70: implement hypoglycemia protocol CBG 70 - 120: 0 units CBG 121 - 150: 1 unit CBG 151 - 200: 2 units CBG 201 - 250: 3 units CBG 251 - 300: 5 units CBG  301 - 350: 7 units CBG 351 - 400: 9 units CBG > 400: call MD and obtain STAT lab verification   Insulin Glargine 100 UNIT/ML Solostar Pen Commonly known as:  LANTUS Inject 30 Units into the skin 2 (two) times daily. What changed:  how much to take   ipratropium 0.06 % nasal spray Commonly known as:  ATROVENT Place 1 spray into the nose daily.   loratadine 10 MG tablet Commonly known as:  CLARITIN Take 10 mg by mouth daily.   LUMIGAN 0.01 % Soln Generic drug:  bimatoprost Place 1 drop into both eyes at bedtime.   Magnesium 500 MG Caps Take 1,000 mg by mouth at bedtime.   metoprolol tartrate 25 MG tablet Commonly known as:  LOPRESSOR Take 0.5 tablets (12.5 mg total) by mouth 2 (two) times daily. What changed:  See the new instructions.   MYRBETRIQ 50 MG Tb24 tablet Generic drug:  mirabegron ER Take one tablet by mouth once daily for kidneys   nitroGLYCERIN 0.4 MG SL tablet Commonly known as:  NITROSTAT Place 0.4 mg under the tongue every 5 (five) minutes as needed for chest  pain.   nystatin cream Commonly known as:  MYCOSTATIN Apply 1 application topically daily.   ONE TOUCH ULTRA TEST test strip Generic drug:  glucose blood 1 each by Other route as needed.   pantoprazole 40 MG tablet Commonly known as:  PROTONIX Take 1 tablet (40 mg total) by mouth daily.   PARoxetine 10 MG tablet Commonly known as:  PAXIL TAKE 1 TABLET BY MOUTH  DAILY   polyethylene glycol packet Commonly known as:  MIRALAX / GLYCOLAX Take 17 g by mouth daily as needed for moderate constipation.   rOPINIRole 0.5 MG tablet Commonly known as:  REQUIP TAKE 1 TABLET BY MOUTH AT  BEDTIME   rosuvastatin 40 MG tablet Commonly known as:  CRESTOR TAKE 1 TABLET BY MOUTH  DAILY   sitaGLIPtin 50 MG tablet Commonly known as:  JANUVIA Take 50 mg by mouth at bedtime.   STOOL SOFTENER 100 MG capsule Generic drug:  docusate sodium Take 100 mg by mouth at bedtime.   traMADol 50 MG tablet Commonly known as:  ULTRAM Take 1 tablet (50 mg total) by mouth 2 (two) times daily.   VESICARE 5 MG tablet Generic drug:  solifenacin TAKE 1 TABLET (5 MG TOTAL) BY MOUTH ONCE DAILY.   XARELTO 15 MG Tabs tablet Generic drug:  Rivaroxaban TAKE 1 TABLET BY MOUTH  DAILY WITH SUPPER        DISCHARGE INSTRUCTIONS:  Follow-up with PCP in 3-5 days Follow-up with urology [facility]  Or [facility] in 1 week   DIET:  Cardiac diet and Diabetic diet  DISCHARGE CONDITION:  Stable  ACTIVITY:  Activity as tolerated  OXYGEN:  Home Oxygen: No.   Oxygen Delivery: room air  DISCHARGE LOCATION:  nursing home   If you experience worsening of your admission symptoms, develop shortness of breath, life threatening emergency, suicidal or homicidal thoughts you must seek medical attention immediately by calling 911 or calling your MD immediately  if symptoms less severe.  You Must read complete instructions/literature along with all the possible adverse reactions/side effects for all the Medicines you  take and that have been prescribed to you. Take any new Medicines after you have completely understood and accpet all the possible adverse reactions/side effects.   Please note  You were cared for by a hospitalist during your hospital stay. If you have any  questions about your discharge medications or the care you received while you were in the hospital after you are discharged, you can call the unit and asked to speak with the hospitalist on call if the hospitalist that took care of you is not available. Once you are discharged, your primary care physician will handle any further medical issues. Please note that NO REFILLS for any discharge medications will be authorized once you are discharged, as it is imperative that you return to your primary care physician (or establish a relationship with a primary care physician if you do not have one) for your aftercare needs so that they can reassess your need for medications and monitor your lab values.     Today  Chief Complaint  Patient presents with  . Loss of Consciousness  . Dizziness   Patient is feeling fine denies any complaints.  Denies any dizziness or passing out episodes  ROS:  CONSTITUTIONAL: Denies fevers, chills. Denies any fatigue, weakness.  EYES: Denies blurry vision, double vision, eye pain. EARS, NOSE, THROAT: Denies tinnitus, ear pain, hearing loss. RESPIRATORY: Denies cough, wheeze, shortness of breath.  CARDIOVASCULAR: Denies chest pain, palpitations, edema.  GASTROINTESTINAL: Denies nausea, vomiting, diarrhea, abdominal pain. Denies bright red blood per rectum. GENITOURINARY: Denies dysuria, hematuria. ENDOCRINE: Denies nocturia or thyroid problems. HEMATOLOGIC AND LYMPHATIC: Denies easy bruising or bleeding. SKIN: Denies rash or lesion. MUSCULOSKELETAL: Denies pain in neck, back, shoulder, knees, hips or arthritic symptoms.  NEUROLOGIC: Denies paralysis, paresthesias.  PSYCHIATRIC: Denies anxiety or depressive  symptoms.   VITAL SIGNS:  Blood pressure (!) 147/61, pulse 92, temperature 98.3 F (36.8 C), temperature source Oral, resp. rate 18, height 5\' 4"  (1.626 m), weight 99.8 kg (220 lb), SpO2 95 %.  I/O:    Intake/Output Summary (Last 24 hours) at 04/25/2017 1326 Last data filed at 04/25/2017 1027 Gross per 24 hour  Intake 340 ml  Output 3000 ml  Net -2660 ml    PHYSICAL EXAMINATION:  GENERAL:  84 y.o.-year-old patient lying in the bed with no acute distress.  EYES: Pupils equal, round, reactive to light and accommodation. No scleral icterus. Extraocular muscles intact.  HEENT: Head atraumatic, normocephalic. Oropharynx and nasopharynx clear.  NECK:  Supple, no jugular venous distention. No thyroid enlargement, no tenderness.  LUNGS: Normal breath sounds bilaterally, no wheezing, rales,rhonchi or crepitation. No use of accessory muscles of respiration.  CARDIOVASCULAR: S1, S2 normal. No murmurs, rubs, or gallops.  ABDOMEN: Soft, non-tender, non-distended. Bowel sounds present. No organomegaly or mass.  EXTREMITIES: No pedal edema, cyanosis, or clubbing.  NEUROLOGIC: Cranial nerves II through XII are intact. Muscle strength 5/5 in all extremities. Sensation intact. Gait not checked.  PSYCHIATRIC: The patient is alert and oriented x 3.  SKIN: No obvious rash, lesion, or ulcer.   DATA REVIEW:   CBC Recent Labs  Lab 04/24/17 0418  WBC 11.8*  HGB 12.1  HCT 36.0  PLT 268    Chemistries  Recent Labs  Lab 04/25/17 0410  NA 136  K 4.0  CL 100*  CO2 25  GLUCOSE 252*  BUN 27*  CREATININE 1.15*  CALCIUM 9.5    Cardiac Enzymes Recent Labs  Lab 04/23/17 1514  TROPONINI <0.03    Microbiology Results  Results for orders placed or performed during the hospital encounter of 04/23/17  Urine Culture     Status: Abnormal (Preliminary result)   Collection Time: 04/23/17  3:21 PM  Result Value Ref Range Status   Specimen Description   Final  URINE, RANDOM Performed at  [facility], [address] [place], [place] [address]    Special Requests   Final    NONE Performed at [facility], [address] [place], [place] [address]    Culture >=100,000 COLONIES/mL GRAM NEGATIVE RODS (A)  Final   Report Status PENDING  Incomplete    RADIOLOGY:  Dg Chest 2 View  Result Date: 04/23/2017 CLINICAL DATA:  Weakness and near syncope EXAM: CHEST  2 VIEW COMPARISON:  11/18/2015 FINDINGS: No acute consolidation or effusion. Minimal atelectasis or scarring at the left base. Mild cardiomegaly. No pleural effusion. Degenerative changes of the spine. IMPRESSION: No active cardiopulmonary disease.  Borderline to mild cardiomegaly. Electronically Signed   By: Donavan Foil M.D.   On: 04/23/2017 22:03   US Renal  Result Date: 04/24/2017 CLINICAL DATA:  Urinary tract infections EXAM: RENAL / URINARY TRACT ULTRASOUND COMPLETE COMPARISON:  CT abdomen and pelvis January 29, 2007 FINDINGS: Right Kidney: Length: 10.4 cm. Echogenicity and renal cortical thickness are within normal limits. No mass, perinephric fluid, or hydronephrosis visualized. No sonographically demonstrable calculus or ureterectasis. Left Kidney: Length: 10.4 cm. Echogenicity and renal cortical thickness are within normal limits. No mass, perinephric fluid, or hydronephrosis visualized. No sonographically demonstrable calculus or ureterectasis. Bladder: Appears normal for degree of bladder distention. IMPRESSION: Study within normal limits. Electronically Signed   By: Lowella Grip III M.D.   On: 04/24/2017 09:03    EKG:   Orders placed or performed during the hospital encounter of 04/23/17  . ED EKG  . ED EKG  . EKG      Management plans discussed with the patient, family and they are in agreement.  CODE STATUS:     Code Status Orders  (From admission, onward)        Start     Ordered   04/23/17 2217  Do not attempt resuscitation (DNR)  Continuous    Question Answer  Comment  In the event of cardiac or respiratory ARREST Do not call a "code blue"   In the event of cardiac or respiratory ARREST Do not perform Intubation, CPR, defibrillation or ACLS   In the event of cardiac or respiratory ARREST Use medication by any route, position, wound care, and other measures to relive pain and suffering. May use oxygen, suction and manual treatment of airway obstruction as needed for comfort.      04/23/17 2216    Code Status History    Date Active Date Inactive Code Status Order ID Comments User Context   12/26/2014 18:22 12/29/2014 16:32 Full Code [id number]  Allie Bossier, MD ED   12/19/2012 22:29 12/20/2012 21:24 Full Code [id number]  Consuelo Pandy, PA-C Inpatient    Advance Directive Documentation     Most Recent Value  Type of Advance Directive  Healthcare Power of Attorney  Pre-existing out of facility DNR order (yellow form or pink MOST form)  No data  "MOST" Form in Place?  No data      TOTAL TIME TAKING CARE OF THIS PATIENT: 43 minutes.   Note: This dictation was prepared with Dragon dictation along with smaller phrase technology. Any transcriptional errors that result from this process are unintentional.   @[facility] @  on 04/25/2017 at 1:26 PM  Between 7am to 6pm - Pager - [phone]  After 6pm go to [email or web address] - password [email or web address] [facility]  [place]  Office  [phone]  CC: Primary care physician; Leone Haven, MD

## 2017-04-25 NOTE — Care Management Obs Status (Addendum)
[place] NOTIFICATION   Patient Details  Name: Abigail Boyd MRN: [id number] Date of Birth: 1929/09/08   Medicare Observation Status Notification Given:  Yes  Permission given by Mr Jase Himmelberger, RN 04/25/2017, 8:57 AM

## 2017-04-26 DIAGNOSIS — N183 Chronic kidney disease, stage 3 (moderate): Secondary | ICD-10-CM | POA: Diagnosis not present

## 2017-04-26 DIAGNOSIS — E1122 Type 2 diabetes mellitus with diabetic chronic kidney disease: Secondary | ICD-10-CM | POA: Diagnosis not present

## 2017-04-26 DIAGNOSIS — Z794 Long term (current) use of insulin: Secondary | ICD-10-CM | POA: Diagnosis not present

## 2017-04-26 DIAGNOSIS — N390 Urinary tract infection, site not specified: Secondary | ICD-10-CM | POA: Diagnosis not present

## 2017-04-26 DIAGNOSIS — I4891 Unspecified atrial fibrillation: Secondary | ICD-10-CM | POA: Diagnosis not present

## 2017-04-26 NOTE — Telephone Encounter (Signed)
-----   Message from Abbie Sons, MD sent at 04/24/2017  4:08 PM EST ----- Please schedule new patient appointment in approximately 2-3 weeks.  Diagnosis recurrent UTI

## 2017-04-26 NOTE — Telephone Encounter (Signed)
Patient was consulted on in the hospital App made and mailed to the patient  Abigail Boyd

## 2017-04-27 LAB — URINE CULTURE: Culture: 50000 — AB

## 2017-05-01 DIAGNOSIS — R110 Nausea: Secondary | ICD-10-CM | POA: Diagnosis not present

## 2017-05-01 DIAGNOSIS — E119 Type 2 diabetes mellitus without complications: Secondary | ICD-10-CM | POA: Diagnosis not present

## 2017-05-10 ENCOUNTER — Ambulatory Visit: Payer: Self-pay | Admitting: Urology

## 2017-05-11 ENCOUNTER — Inpatient Hospital Stay: Payer: Medicare Other | Admitting: Family Medicine

## 2017-05-14 DIAGNOSIS — R110 Nausea: Secondary | ICD-10-CM | POA: Diagnosis not present

## 2017-05-14 DIAGNOSIS — E119 Type 2 diabetes mellitus without complications: Secondary | ICD-10-CM | POA: Diagnosis not present

## 2017-05-16 ENCOUNTER — Telehealth: Payer: Self-pay | Admitting: Family Medicine

## 2017-05-16 DIAGNOSIS — N182 Chronic kidney disease, stage 2 (mild): Secondary | ICD-10-CM | POA: Diagnosis not present

## 2017-05-16 DIAGNOSIS — E1140 Type 2 diabetes mellitus with diabetic neuropathy, unspecified: Secondary | ICD-10-CM | POA: Diagnosis not present

## 2017-05-16 DIAGNOSIS — J449 Chronic obstructive pulmonary disease, unspecified: Secondary | ICD-10-CM | POA: Diagnosis not present

## 2017-05-16 DIAGNOSIS — G43909 Migraine, unspecified, not intractable, without status migrainosus: Secondary | ICD-10-CM | POA: Diagnosis not present

## 2017-05-16 DIAGNOSIS — I480 Paroxysmal atrial fibrillation: Secondary | ICD-10-CM | POA: Diagnosis not present

## 2017-05-16 DIAGNOSIS — I129 Hypertensive chronic kidney disease with stage 1 through stage 4 chronic kidney disease, or unspecified chronic kidney disease: Secondary | ICD-10-CM | POA: Diagnosis not present

## 2017-05-16 DIAGNOSIS — G4733 Obstructive sleep apnea (adult) (pediatric): Secondary | ICD-10-CM | POA: Diagnosis not present

## 2017-05-16 DIAGNOSIS — I25119 Atherosclerotic heart disease of native coronary artery with unspecified angina pectoris: Secondary | ICD-10-CM | POA: Diagnosis not present

## 2017-05-16 DIAGNOSIS — I495 Sick sinus syndrome: Secondary | ICD-10-CM | POA: Diagnosis not present

## 2017-05-16 DIAGNOSIS — E1122 Type 2 diabetes mellitus with diabetic chronic kidney disease: Secondary | ICD-10-CM | POA: Diagnosis not present

## 2017-05-16 DIAGNOSIS — M48061 Spinal stenosis, lumbar region without neurogenic claudication: Secondary | ICD-10-CM | POA: Diagnosis not present

## 2017-05-16 DIAGNOSIS — M1990 Unspecified osteoarthritis, unspecified site: Secondary | ICD-10-CM | POA: Diagnosis not present

## 2017-05-16 NOTE — Telephone Encounter (Signed)
Copied from [place] [id number]. Topic: Quick Communication - See Telephone Encounter >> May 16, 2017  4:34 PM Cleaster Corin, NT wrote: CRM for notification. See Telephone encounter for:   05/16/17. Merry Proud calling from [facility] to get verbal orders for pt. Skilled nursing for infection diease 2 week 4 PT to evaluate and home health aide for adl assist for 2 week 8 Merry Proud can be reached at [phone] can leave vm

## 2017-05-16 NOTE — Telephone Encounter (Signed)
Verbal orders can be given. 

## 2017-05-16 NOTE — Telephone Encounter (Signed)
Please advise 

## 2017-05-17 DIAGNOSIS — I25119 Atherosclerotic heart disease of native coronary artery with unspecified angina pectoris: Secondary | ICD-10-CM | POA: Diagnosis not present

## 2017-05-17 DIAGNOSIS — I480 Paroxysmal atrial fibrillation: Secondary | ICD-10-CM | POA: Diagnosis not present

## 2017-05-17 DIAGNOSIS — M1990 Unspecified osteoarthritis, unspecified site: Secondary | ICD-10-CM | POA: Diagnosis not present

## 2017-05-17 DIAGNOSIS — N182 Chronic kidney disease, stage 2 (mild): Secondary | ICD-10-CM | POA: Diagnosis not present

## 2017-05-17 DIAGNOSIS — E1140 Type 2 diabetes mellitus with diabetic neuropathy, unspecified: Secondary | ICD-10-CM | POA: Diagnosis not present

## 2017-05-17 DIAGNOSIS — I129 Hypertensive chronic kidney disease with stage 1 through stage 4 chronic kidney disease, or unspecified chronic kidney disease: Secondary | ICD-10-CM | POA: Diagnosis not present

## 2017-05-17 DIAGNOSIS — M48061 Spinal stenosis, lumbar region without neurogenic claudication: Secondary | ICD-10-CM | POA: Diagnosis not present

## 2017-05-17 DIAGNOSIS — I495 Sick sinus syndrome: Secondary | ICD-10-CM | POA: Diagnosis not present

## 2017-05-17 DIAGNOSIS — J449 Chronic obstructive pulmonary disease, unspecified: Secondary | ICD-10-CM | POA: Diagnosis not present

## 2017-05-17 DIAGNOSIS — G43909 Migraine, unspecified, not intractable, without status migrainosus: Secondary | ICD-10-CM | POA: Diagnosis not present

## 2017-05-17 DIAGNOSIS — G4733 Obstructive sleep apnea (adult) (pediatric): Secondary | ICD-10-CM | POA: Diagnosis not present

## 2017-05-17 DIAGNOSIS — E1122 Type 2 diabetes mellitus with diabetic chronic kidney disease: Secondary | ICD-10-CM | POA: Diagnosis not present

## 2017-05-17 NOTE — Telephone Encounter (Signed)
Verbal orders given  

## 2017-05-18 ENCOUNTER — Telehealth: Payer: Self-pay

## 2017-05-18 NOTE — Telephone Encounter (Signed)
Verbal orders can be given.  Thanks.

## 2017-05-18 NOTE — Telephone Encounter (Signed)
Copied from [place] [id number]. Topic: Quick Communication - See Telephone Encounter >> May 16, 2017  4:34 PM Cleaster Corin, NT wrote: CRM for notification. See Telephone encounter for:   05/16/17. Merry Proud calling from [facility] to get verbal orders for pt. Skilled nursing for infection diease 2 week 4 PT to evaluate and home health aide for adl assist for 2 week 8 Merry Proud can be reached at [phone] can leave vm >> May 18, 2017 10:59 AM Carolyn Stare wrote:  Katherine Roan with [facility] cal lto add PT 1 time a week for 1 week, 2 times a week for 6 weeks    [phone]

## 2017-05-18 NOTE — Telephone Encounter (Signed)
Please advise 

## 2017-05-18 NOTE — Telephone Encounter (Signed)
Verbal order given  

## 2017-05-21 DIAGNOSIS — G4733 Obstructive sleep apnea (adult) (pediatric): Secondary | ICD-10-CM | POA: Diagnosis not present

## 2017-05-21 DIAGNOSIS — M1990 Unspecified osteoarthritis, unspecified site: Secondary | ICD-10-CM | POA: Diagnosis not present

## 2017-05-21 DIAGNOSIS — N182 Chronic kidney disease, stage 2 (mild): Secondary | ICD-10-CM | POA: Diagnosis not present

## 2017-05-21 DIAGNOSIS — I25119 Atherosclerotic heart disease of native coronary artery with unspecified angina pectoris: Secondary | ICD-10-CM | POA: Diagnosis not present

## 2017-05-21 DIAGNOSIS — E1140 Type 2 diabetes mellitus with diabetic neuropathy, unspecified: Secondary | ICD-10-CM | POA: Diagnosis not present

## 2017-05-21 DIAGNOSIS — G43909 Migraine, unspecified, not intractable, without status migrainosus: Secondary | ICD-10-CM | POA: Diagnosis not present

## 2017-05-21 DIAGNOSIS — M48061 Spinal stenosis, lumbar region without neurogenic claudication: Secondary | ICD-10-CM | POA: Diagnosis not present

## 2017-05-21 DIAGNOSIS — E1122 Type 2 diabetes mellitus with diabetic chronic kidney disease: Secondary | ICD-10-CM | POA: Diagnosis not present

## 2017-05-21 DIAGNOSIS — I480 Paroxysmal atrial fibrillation: Secondary | ICD-10-CM | POA: Diagnosis not present

## 2017-05-21 DIAGNOSIS — I495 Sick sinus syndrome: Secondary | ICD-10-CM | POA: Diagnosis not present

## 2017-05-21 DIAGNOSIS — J449 Chronic obstructive pulmonary disease, unspecified: Secondary | ICD-10-CM | POA: Diagnosis not present

## 2017-05-21 DIAGNOSIS — I129 Hypertensive chronic kidney disease with stage 1 through stage 4 chronic kidney disease, or unspecified chronic kidney disease: Secondary | ICD-10-CM | POA: Diagnosis not present

## 2017-05-22 DIAGNOSIS — N390 Urinary tract infection, site not specified: Secondary | ICD-10-CM | POA: Diagnosis not present

## 2017-05-22 DIAGNOSIS — Z87448 Personal history of other diseases of urinary system: Secondary | ICD-10-CM | POA: Diagnosis not present

## 2017-05-22 DIAGNOSIS — K5900 Constipation, unspecified: Secondary | ICD-10-CM | POA: Diagnosis not present

## 2017-05-22 DIAGNOSIS — Z79899 Other long term (current) drug therapy: Secondary | ICD-10-CM | POA: Diagnosis not present

## 2017-05-22 DIAGNOSIS — R339 Retention of urine, unspecified: Secondary | ICD-10-CM | POA: Diagnosis not present

## 2017-05-23 DIAGNOSIS — I129 Hypertensive chronic kidney disease with stage 1 through stage 4 chronic kidney disease, or unspecified chronic kidney disease: Secondary | ICD-10-CM | POA: Diagnosis not present

## 2017-05-23 DIAGNOSIS — M48061 Spinal stenosis, lumbar region without neurogenic claudication: Secondary | ICD-10-CM | POA: Diagnosis not present

## 2017-05-23 DIAGNOSIS — G43909 Migraine, unspecified, not intractable, without status migrainosus: Secondary | ICD-10-CM | POA: Diagnosis not present

## 2017-05-23 DIAGNOSIS — J449 Chronic obstructive pulmonary disease, unspecified: Secondary | ICD-10-CM | POA: Diagnosis not present

## 2017-05-23 DIAGNOSIS — I25119 Atherosclerotic heart disease of native coronary artery with unspecified angina pectoris: Secondary | ICD-10-CM | POA: Diagnosis not present

## 2017-05-23 DIAGNOSIS — E1140 Type 2 diabetes mellitus with diabetic neuropathy, unspecified: Secondary | ICD-10-CM | POA: Diagnosis not present

## 2017-05-23 DIAGNOSIS — I495 Sick sinus syndrome: Secondary | ICD-10-CM | POA: Diagnosis not present

## 2017-05-23 DIAGNOSIS — N182 Chronic kidney disease, stage 2 (mild): Secondary | ICD-10-CM | POA: Diagnosis not present

## 2017-05-23 DIAGNOSIS — E1122 Type 2 diabetes mellitus with diabetic chronic kidney disease: Secondary | ICD-10-CM | POA: Diagnosis not present

## 2017-05-23 DIAGNOSIS — G4733 Obstructive sleep apnea (adult) (pediatric): Secondary | ICD-10-CM | POA: Diagnosis not present

## 2017-05-23 DIAGNOSIS — M1990 Unspecified osteoarthritis, unspecified site: Secondary | ICD-10-CM | POA: Diagnosis not present

## 2017-05-23 DIAGNOSIS — I480 Paroxysmal atrial fibrillation: Secondary | ICD-10-CM | POA: Diagnosis not present

## 2017-05-24 DIAGNOSIS — M48061 Spinal stenosis, lumbar region without neurogenic claudication: Secondary | ICD-10-CM | POA: Diagnosis not present

## 2017-05-24 DIAGNOSIS — N182 Chronic kidney disease, stage 2 (mild): Secondary | ICD-10-CM | POA: Diagnosis not present

## 2017-05-24 DIAGNOSIS — E1122 Type 2 diabetes mellitus with diabetic chronic kidney disease: Secondary | ICD-10-CM | POA: Diagnosis not present

## 2017-05-24 DIAGNOSIS — J449 Chronic obstructive pulmonary disease, unspecified: Secondary | ICD-10-CM | POA: Diagnosis not present

## 2017-05-24 DIAGNOSIS — G43909 Migraine, unspecified, not intractable, without status migrainosus: Secondary | ICD-10-CM | POA: Diagnosis not present

## 2017-05-24 DIAGNOSIS — I25119 Atherosclerotic heart disease of native coronary artery with unspecified angina pectoris: Secondary | ICD-10-CM | POA: Diagnosis not present

## 2017-05-24 DIAGNOSIS — G4733 Obstructive sleep apnea (adult) (pediatric): Secondary | ICD-10-CM | POA: Diagnosis not present

## 2017-05-24 DIAGNOSIS — I495 Sick sinus syndrome: Secondary | ICD-10-CM | POA: Diagnosis not present

## 2017-05-24 DIAGNOSIS — E1140 Type 2 diabetes mellitus with diabetic neuropathy, unspecified: Secondary | ICD-10-CM | POA: Diagnosis not present

## 2017-05-24 DIAGNOSIS — M1990 Unspecified osteoarthritis, unspecified site: Secondary | ICD-10-CM | POA: Diagnosis not present

## 2017-05-24 DIAGNOSIS — I480 Paroxysmal atrial fibrillation: Secondary | ICD-10-CM | POA: Diagnosis not present

## 2017-05-24 DIAGNOSIS — I129 Hypertensive chronic kidney disease with stage 1 through stage 4 chronic kidney disease, or unspecified chronic kidney disease: Secondary | ICD-10-CM | POA: Diagnosis not present

## 2017-05-27 ENCOUNTER — Other Ambulatory Visit: Payer: Self-pay | Admitting: Cardiology

## 2017-05-27 ENCOUNTER — Other Ambulatory Visit: Payer: Self-pay | Admitting: Family Medicine

## 2017-05-28 DIAGNOSIS — E1122 Type 2 diabetes mellitus with diabetic chronic kidney disease: Secondary | ICD-10-CM | POA: Diagnosis not present

## 2017-05-28 DIAGNOSIS — Z794 Long term (current) use of insulin: Secondary | ICD-10-CM | POA: Diagnosis not present

## 2017-05-28 DIAGNOSIS — I129 Hypertensive chronic kidney disease with stage 1 through stage 4 chronic kidney disease, or unspecified chronic kidney disease: Secondary | ICD-10-CM | POA: Diagnosis not present

## 2017-05-28 DIAGNOSIS — N183 Chronic kidney disease, stage 3 (moderate): Secondary | ICD-10-CM | POA: Diagnosis not present

## 2017-05-28 NOTE — Telephone Encounter (Signed)
REFILL 

## 2017-05-29 DIAGNOSIS — I480 Paroxysmal atrial fibrillation: Secondary | ICD-10-CM | POA: Diagnosis not present

## 2017-05-29 DIAGNOSIS — M1990 Unspecified osteoarthritis, unspecified site: Secondary | ICD-10-CM | POA: Diagnosis not present

## 2017-05-29 DIAGNOSIS — E1122 Type 2 diabetes mellitus with diabetic chronic kidney disease: Secondary | ICD-10-CM | POA: Diagnosis not present

## 2017-05-29 DIAGNOSIS — G43909 Migraine, unspecified, not intractable, without status migrainosus: Secondary | ICD-10-CM | POA: Diagnosis not present

## 2017-05-29 DIAGNOSIS — I25119 Atherosclerotic heart disease of native coronary artery with unspecified angina pectoris: Secondary | ICD-10-CM | POA: Diagnosis not present

## 2017-05-29 DIAGNOSIS — G4733 Obstructive sleep apnea (adult) (pediatric): Secondary | ICD-10-CM | POA: Diagnosis not present

## 2017-05-29 DIAGNOSIS — I495 Sick sinus syndrome: Secondary | ICD-10-CM | POA: Diagnosis not present

## 2017-05-29 DIAGNOSIS — I129 Hypertensive chronic kidney disease with stage 1 through stage 4 chronic kidney disease, or unspecified chronic kidney disease: Secondary | ICD-10-CM | POA: Diagnosis not present

## 2017-05-29 DIAGNOSIS — M48061 Spinal stenosis, lumbar region without neurogenic claudication: Secondary | ICD-10-CM | POA: Diagnosis not present

## 2017-05-29 DIAGNOSIS — J449 Chronic obstructive pulmonary disease, unspecified: Secondary | ICD-10-CM | POA: Diagnosis not present

## 2017-05-29 DIAGNOSIS — N182 Chronic kidney disease, stage 2 (mild): Secondary | ICD-10-CM | POA: Diagnosis not present

## 2017-05-29 DIAGNOSIS — E1140 Type 2 diabetes mellitus with diabetic neuropathy, unspecified: Secondary | ICD-10-CM | POA: Diagnosis not present

## 2017-05-31 DIAGNOSIS — E1140 Type 2 diabetes mellitus with diabetic neuropathy, unspecified: Secondary | ICD-10-CM | POA: Diagnosis not present

## 2017-05-31 DIAGNOSIS — I129 Hypertensive chronic kidney disease with stage 1 through stage 4 chronic kidney disease, or unspecified chronic kidney disease: Secondary | ICD-10-CM | POA: Diagnosis not present

## 2017-05-31 DIAGNOSIS — I25119 Atherosclerotic heart disease of native coronary artery with unspecified angina pectoris: Secondary | ICD-10-CM | POA: Diagnosis not present

## 2017-05-31 DIAGNOSIS — G4733 Obstructive sleep apnea (adult) (pediatric): Secondary | ICD-10-CM | POA: Diagnosis not present

## 2017-05-31 DIAGNOSIS — G43909 Migraine, unspecified, not intractable, without status migrainosus: Secondary | ICD-10-CM | POA: Diagnosis not present

## 2017-05-31 DIAGNOSIS — I480 Paroxysmal atrial fibrillation: Secondary | ICD-10-CM | POA: Diagnosis not present

## 2017-05-31 DIAGNOSIS — M1990 Unspecified osteoarthritis, unspecified site: Secondary | ICD-10-CM | POA: Diagnosis not present

## 2017-05-31 DIAGNOSIS — J449 Chronic obstructive pulmonary disease, unspecified: Secondary | ICD-10-CM | POA: Diagnosis not present

## 2017-05-31 DIAGNOSIS — M48061 Spinal stenosis, lumbar region without neurogenic claudication: Secondary | ICD-10-CM | POA: Diagnosis not present

## 2017-05-31 DIAGNOSIS — I495 Sick sinus syndrome: Secondary | ICD-10-CM | POA: Diagnosis not present

## 2017-05-31 DIAGNOSIS — N182 Chronic kidney disease, stage 2 (mild): Secondary | ICD-10-CM | POA: Diagnosis not present

## 2017-05-31 DIAGNOSIS — E1122 Type 2 diabetes mellitus with diabetic chronic kidney disease: Secondary | ICD-10-CM | POA: Diagnosis not present

## 2017-06-01 DIAGNOSIS — M48061 Spinal stenosis, lumbar region without neurogenic claudication: Secondary | ICD-10-CM | POA: Diagnosis not present

## 2017-06-01 DIAGNOSIS — I480 Paroxysmal atrial fibrillation: Secondary | ICD-10-CM | POA: Diagnosis not present

## 2017-06-01 DIAGNOSIS — E1140 Type 2 diabetes mellitus with diabetic neuropathy, unspecified: Secondary | ICD-10-CM | POA: Diagnosis not present

## 2017-06-01 DIAGNOSIS — M1990 Unspecified osteoarthritis, unspecified site: Secondary | ICD-10-CM | POA: Diagnosis not present

## 2017-06-01 DIAGNOSIS — E1122 Type 2 diabetes mellitus with diabetic chronic kidney disease: Secondary | ICD-10-CM | POA: Diagnosis not present

## 2017-06-01 DIAGNOSIS — G43909 Migraine, unspecified, not intractable, without status migrainosus: Secondary | ICD-10-CM | POA: Diagnosis not present

## 2017-06-01 DIAGNOSIS — I129 Hypertensive chronic kidney disease with stage 1 through stage 4 chronic kidney disease, or unspecified chronic kidney disease: Secondary | ICD-10-CM | POA: Diagnosis not present

## 2017-06-01 DIAGNOSIS — N182 Chronic kidney disease, stage 2 (mild): Secondary | ICD-10-CM | POA: Diagnosis not present

## 2017-06-01 DIAGNOSIS — J449 Chronic obstructive pulmonary disease, unspecified: Secondary | ICD-10-CM | POA: Diagnosis not present

## 2017-06-01 DIAGNOSIS — G4733 Obstructive sleep apnea (adult) (pediatric): Secondary | ICD-10-CM | POA: Diagnosis not present

## 2017-06-01 DIAGNOSIS — I495 Sick sinus syndrome: Secondary | ICD-10-CM | POA: Diagnosis not present

## 2017-06-01 DIAGNOSIS — I25119 Atherosclerotic heart disease of native coronary artery with unspecified angina pectoris: Secondary | ICD-10-CM | POA: Diagnosis not present

## 2017-06-01 NOTE — Progress Notes (Signed)
06/04/2017 3:25 PM   Abigail Boyd [birth year] [id number]  Referring provider: Leone Haven, MD [address] [place], [place] [address]  Chief Complaint  Patient presents with  . urine retention    vaginal redness    HPI: Patient is a 84 -year-old Caucasian female who is referred to [place] by [facility] for recurrent urinary tract infections who is a poor historian, accompanied by her caregiver, Abigail Boyd, who has been with her for two weeks.    Patient could not give an account on how often she had UTI's.  Her caregiver could not elaborate.    Reviewing her records,  she has had greater than three documented positive UTI's over the last year.  One for pan-sensitive proteus and the rest for pan-sensitive E. Coli.  She was recently seen at [facility] urology on 05/22/2017.  At her visit, she was instructed to stop the Vesicare as it was contributing to her constipation.  She deferred a hematuria work up, CIC and a Foley catheter for incomplete emptying.  She was being followed by them, but they are wishing to have an urologist closer to home.    Patient's daughter filled out her H & P form, but she was not able to be present for today's visit.  The daughter circled urinary frequency, urgency, dysuria, nocturia, incontinence and urinary tract infections.  Caregiver states that in the short time she has been with the patient, she has noted the patient using the restroom every 1-1/2 hours, having a strong urge to urinate, burning with urination every time she urinates, nocturia x3-4 and several episodes of urge incontinence. She is wearing pads.  Her CATH UA is positive for > 30 WBC's and a few bacteria.      Patient denies any gross hematuria, dysuria or suprapubic/flank pain.  Patient denies any fevers, chills, nausea or vomiting.   She does not have a history of nephrolithiasis, GU surgery or GU trauma.   She is not sexually active.  She is postmenopausal.   She admits to constipation.   Her perineal hygiene is compromised due to her dementia.   She does not take tub baths.     RUS on 04/24/2017 was normal.    She is drinking an unknown amount of water daily.  She is not drinking caffeine drinks.  She is not drinking alcohol.  She is not a smoker.    Hospital Course from 04/25/2017 HISTORY OF PRESENT ILLNESS:ErmaWeaksis a82 y.o.femalewith a known history of anxiety, bilateral cataracts, coronary artery disease, status post PCI, chronic kidney disease stage II, diverticulitis, essential hypertension, glaucoma, gastroesophageal reflux disease, paroxysmal atrial fibrillation, stroke-lives at home with her daughter and having recurrent infections with UTI in last few weeks. Treated for UTI with Keflex recently from ER. Her Escherichia coli in urine culture was sensitive to that also. Again today when she was in the bathroom she felt very weak and had a syncopal episode and she could not stand up from there, so her daughter called ambulance and brought her to emergency room. Patient denies any associated symptoms and she was noted to have UTI again so ER physician tried to discharge her home with some oral antibiotics, but when she tried to stand her up she could barely stand up or walk. She checked her orthostatic vital signs and they were stable but patient could not stay up or walk so cannot go home,given to hospital ist team for further management.   *Syncopal episode Monitor on  telemetry no arrhythmia noticed, not orthostatic ivf provided  physical therapy recs -SNF  *Recurrent UTI with a history of overactive bladder without incontinence She had Escherichia coli every time which was sensitive to most of all the antibiotic Given Keflex in last admission and Escherichia coli was sensitive,receivedRocephin IV during this admission, will change to Augmentin  pendingurine culture showing gram-negative rods PCP to follow-up on the final  sensitivity nmlultrasound on kidney urologyseen ptfor recurrent UTI. OP f/u with [facility] urology group or [place] [facility] as patient was seen by [facility] urology group in the past at patient's discretion  *Diabetes Continue long-acting insulin at lower dose and keep on sliding scale coverage.  *A. fib with history of stroke Continue anticoagulation.  *Hypertension Continue metoprolol  *Hyperlipidemia Continue Crestor.     PMH: Past Medical History:  Diagnosis Date  . Abnormal nuclear stress test 06/21/2011   Inferolateral reversible defect;--> cardiac cath & PCI of CxOM, occluded RCA with collaterals;; followup Myoview 11/2012: Low risk. Fixed basal inferior artifact normal EF. No ischemia  . Anxiety    When necessary Xanax  . Anxiety   . Asthma   . Bilateral cataracts    Status post stroke or correction  . CAD S/P percutaneous coronary angioplasty 06/2011   s/P PCI to proximal OM1 w/ DES; occluded RCA with bridging and L-R collaterals (medical management)  . Chronic kidney disease (CKD), stage II (mild)    Related to current bladder infections (although diabetes cannot be excluded)  . Chronotropic incompetence with sinus node dysfunction [facility]) October 2013   On CPET test; beta blockers reduced  . Diabetes mellitus type 2, controlled ([place])    On oral medications  . Diverticulitis   . Essential hypertension    Allowing for permissive hypertension to avoid orthostatic hypotension  . GERD (gastroesophageal reflux disease)    On PPI  . Glaucoma   . History of syncope    Per EP - neurocardiogenic & not Bradycardia related (no PPM)  . History of unstable angina 06/13/2011   Jaw pain awakening from sleep -- Myoview --CATH --> PCI  . Hyperlipidemia with target LDL less than 70    HDL at goal, LDL not at goal, borderline triglycerides. On Crestor 20 mg  . Migraines   . OSA (obstructive sleep apnea)    hx bladder infections  . Osteoarthritis   . PAF  (paroxysmal atrial fibrillation) ([place]) 10-11/ 2014   CardioNet Event Monitor: NSR & S Brady -- Rates 50-100; Total A. fib burden 35 hours and 27 minutes. 1296 episodes, longest was 1 hour 29 minutes. Rate ranged from 52-169 beats per minute.  . Seasonal allergies   . Shortness of breath on exertion October 2013   2-D echo: Normal EF>55%, Gr 1DD, mild aortic sclerosis; Evaluated with CPET - peak VO2 97%; Chronotropic Incompetence (submaximal effort)  . Spinal stenosis of lumbar region 11/2011  . Stroke ([place])   . Tachycardia-bradycardia syndrome ([place]) 11/2012  . Urge incontinence     Surgical History: Past Surgical History:  Procedure Laterality Date  . APPENDECTOMY    . BREAST BIOPSY     both breast  . cataracts     both eyes  . COLONOSCOPY    . CORONARY ANGIOPLASTY WITH STENT PLACEMENT  07/05/2011   Promus Element DES 2.5 mm x 16 mm-post dilated to 2.65 mm CX-Proximal OM1  . Holter Monitor  04/2015   Sinus rhythm with rates 52-149 BPM. Isolated PACs with rare couplets and bigeminy. Multiple short runs  of PAT/PSVT. Arrhythmia run 120 bpm for 204 beats. 14% of time was in A. fib/flutter. - Reviewed by Dr. Caryl Comes. Not thought to be significant enough for her syncope.  Marland Kitchen KNEE ARTHROSCOPY WITH MEDIAL MENISECTOMY Right 06/24/2014   Procedure: RIGHT KNEE ARTHROSCOPY WITH partial lateral MENISECTOMY, abrasion chondroplasty of medial femoral condyle and patella, microfracture technique;  Surgeon: Latanya Maudlin, MD;  Location: [facility] ORS;  Service: Orthopedics;  Laterality: Right;  . LEFT HEART CATHETERIZATION WITH CORONARY ANGIOGRAM N/A 07/06/2011   Procedure: LEFT HEART CATHETERIZATION WITH CORONARY ANGIOGRAM;  Surgeon: Leonie Man, MD;  Location: [facility] CATH LAB: Unstable Angina --> Myoview wiht Inf-Lat Ischemia.  Proximal OM1 lesion --> PCI; 100% mid RCA with right to right and left to right bridging collaterals from circumflex RPL and LAD the PDA.  Marland Kitchen NM MYOVIEW LTD  October 2014    Low risk. Fixed  basal inferior artifact normal EF. No ischemia --> (as compared to pre-PCI Myoview revealing inferolateral ischemia)  . rotator cuff Right   . TRANSTHORACIC ECHOCARDIOGRAM  11/2014   EF 65-70%, Mod LVH. Normal wall motion. Gr 1 DD. Normal valves.  Marland Kitchen VAGINAL HYSTERECTOMY      Home Medications:  Allergies as of 06/04/2017      Reactions   Amlodipine Cough   Clopidogrel Bisulfate Cough   Kenalog [triamcinolone Acetonide]    unknown   Lisinopril Cough   Losartan Cough   Sulfa Antibiotics Hives, Rash      Medication List        Accurate as of 06/04/17  3:25 PM. Always use your most recent med list.          acetaminophen 650 MG CR tablet Commonly known as:  TYLENOL Take 1,300 mg by mouth 2 (two) times daily as needed for pain.   ALPRAZolam 0.25 MG tablet Commonly known as:  XANAX TAKE 1 TABLET BY MOUTH AT BEDTIME AS NEEDED SLEEP   conjugated estrogens vaginal cream Commonly known as:  PREMARIN Place 1 Applicatorful vaginally daily. Apply 0.32m (pea-sized amount)  just inside the vaginal introitus with a finger-tip on  Monday, Wednesday and Friday nights.   estradiol 0.1 MG/GM vaginal cream Commonly known as:  ESTRACE VAGINAL Apply 0.543m(pea-sized amount)  just inside the vaginal introitus with a finger-tip on Monday, Wednesday and Friday nights.   fish oil-omega-3 fatty acids 1000 MG capsule Take 1 g by mouth daily.   furosemide 20 MG tablet Commonly known as:  LASIX Take 2 tablets (40 mg total) by mouth daily.   gabapentin 300 MG capsule Commonly known as:  NEURONTIN Take 300 mg by mouth 3 (three) times daily. gabapentin 300 mg capsule  take one cap po X 1 week, then take one cap po bid X 1 week, then take one cap po tid and continue   insulin aspart 100 UNIT/ML injection Commonly known as:  novoLOG Inject 0-9 Units into the skin 3 (three) times daily with meals. CBG < 70: implement hypoglycemia protocol CBG 70 - 120: 0 units CBG 121 - 150: 1 unit CBG 151 - 200: 2  units CBG 201 - 250: 3 units CBG 251 - 300: 5 units CBG 301 - 350: 7 units CBG 351 - 400: 9 units CBG > 400: call MD and obtain STAT lab verification   Insulin Glargine 100 UNIT/ML Solostar Pen Commonly known as:  LANTUS Inject 30 Units into the skin 2 (two) times daily.   ipratropium 0.06 % nasal spray Commonly known as:  ATROVENT Place  1 spray into the nose daily.   loratadine 10 MG tablet Commonly known as:  CLARITIN Take 10 mg by mouth daily.   LUMIGAN 0.01 % Soln Generic drug:  bimatoprost Place 1 drop into both eyes at bedtime.   Magnesium 500 MG Caps Take 1,000 mg by mouth at bedtime.   metoprolol tartrate 25 MG tablet Commonly known as:  LOPRESSOR TAKE 1 TABLET BY MOUTH  TWICE DAILY. HOLD IF  SYSTOLIC BP IS UNDER 096  AND HEART RATE UNDER 60   MYRBETRIQ 50 MG Tb24 tablet Generic drug:  mirabegron ER Take one tablet by mouth once daily for kidneys   nitroGLYCERIN 0.4 MG SL tablet Commonly known as:  NITROSTAT Place 0.4 mg under the tongue every 5 (five) minutes as needed for chest pain.   nystatin cream Commonly known as:  MYCOSTATIN Apply 1 application topically daily.   ONE TOUCH ULTRA TEST test strip Generic drug:  glucose blood 1 each by Other route as needed.   pantoprazole 40 MG tablet Commonly known as:  PROTONIX Take 1 tablet (40 mg total) by mouth daily.   PARoxetine 10 MG tablet Commonly known as:  PAXIL TAKE 1 TABLET BY MOUTH  DAILY   polyethylene glycol packet Commonly known as:  MIRALAX / GLYCOLAX Take 17 g by mouth daily as needed for moderate constipation.   rOPINIRole 0.5 MG tablet Commonly known as:  REQUIP TAKE 1 TABLET BY MOUTH AT  BEDTIME   rosuvastatin 40 MG tablet Commonly known as:  CRESTOR TAKE 1 TABLET BY MOUTH  DAILY   sitaGLIPtin 50 MG tablet Commonly known as:  JANUVIA Take 50 mg by mouth at bedtime.   STOOL SOFTENER 100 MG capsule Generic drug:  docusate sodium Take 100 mg by mouth at bedtime.   traMADol 50  MG tablet Commonly known as:  ULTRAM Take 1 tablet (50 mg total) by mouth 2 (two) times daily.   VESICARE 5 MG tablet Generic drug:  solifenacin TAKE 1 TABLET (5 MG TOTAL) BY MOUTH ONCE DAILY.       Allergies:  Allergies  Allergen Reactions  . Amlodipine Cough  . Clopidogrel Bisulfate Cough  . Kenalog [Triamcinolone Acetonide]     unknown  . Lisinopril Cough  . Losartan Cough  . Sulfa Antibiotics Hives and Rash    Family History: Family History  Problem Relation Age of Onset  . COPD Mother   . Pulmonary fibrosis Mother   . Heart attack Father   . Heart disease Father   . Stroke Father   . Cancer Sister   . Cancer Sister   . Clotting disorder Sister   . Heart attack Sister   . Arthritis Unknown   . Breast cancer Sister   . Colon cancer Neg Hx   . Esophageal cancer Neg Hx   . Stomach cancer Neg Hx     Social History:  reports that she has never smoked. She has never used smokeless tobacco. She reports that she does not drink alcohol or use drugs.  ROS: UROLOGY Frequent Urination?: Yes Hard to postpone urination?: Yes Burning/pain with urination?: Yes Get up at night to urinate?: Yes Leakage of urine?: Yes Urine stream starts and stops?: No Trouble starting stream?: No Do you have to strain to urinate?: No Blood in urine?: No Urinary tract infection?: Yes Sexually transmitted disease?: No Injury to kidneys or bladder?: No Painful intercourse?: No Weak stream?: No Currently pregnant?: No Vaginal bleeding?: No Last menstrual period?: n  Gastrointestinal Nausea?:  No Vomiting?: No Indigestion/heartburn?: No Diarrhea?: No Constipation?: No  Constitutional Fever: No Night sweats?: No Weight loss?: No Fatigue?: No  Skin Skin rash/lesions?: No Itching?: No  Eyes Blurred vision?: No Double vision?: No  Ears/Nose/Throat Sore throat?: No Sinus problems?: No  Hematologic/Lymphatic Swollen glands?: No Easy bruising?: No  Cardiovascular Leg  swelling?: No Chest pain?: No  Respiratory Cough?: No Shortness of breath?: No  Endocrine Excessive thirst?: No  Musculoskeletal Back pain?: No Joint pain?: No  Neurological Headaches?: No Dizziness?: No  Psychologic Depression?: No Anxiety?: No  Physical Exam: BP 137/70   Boyd 74   Ht 5' 4"  (1.626 m)   Wt 181 lb (82.1 kg)   BMI 31.07 kg/m   Constitutional: Well nourished. Alert and oriented, No acute distress. HEENT: [place] AT, moist mucus membranes. Trachea midline, no masses. Cardiovascular: No clubbing, cyanosis, or edema. Respiratory: Normal respiratory effort, no increased work of breathing. GI: Abdomen is soft, non tender, non distended, no abdominal masses. Liver and spleen not palpable.  No hernias appreciated.  Stool sample for occult testing is not indicated.   GU: No CVA tenderness.  No bladder fullness or masses.  Atrophic external genitalia, normal pubic hair distribution, no lesions.  Normal urethral meatus, no lesions, no prolapse, no discharge.   No urethral masses, tenderness and/or tenderness. No bladder fullness, tenderness or masses.  Erythematous vagina mucosa, poor estrogen effect, no discharge, no lesions.  Could not complete exam due to patient's tenderness.  Anus and perineum are without rashes or lesions.    Skin: No rashes, bruises or suspicious lesions. Lymph: No cervical or inguinal adenopathy. Neurologic: Grossly intact, no focal deficits, moving all 4 extremities. Psychiatric: Normal mood and affect.  Laboratory Data: Lab Results  Component Value Date   WBC 11.8 (H) 04/24/2017   HGB 12.1 04/24/2017   HCT 36.0 04/24/2017   MCV 93.3 04/24/2017   PLT 268 04/24/2017    Lab Results  Component Value Date   CREATININE 1.15 (H) 04/25/2017    No results found for: PSA  No results found for: TESTOSTERONE  Lab Results  Component Value Date   HGBA1C 8.4 (H) 12/12/2016    Lab Results  Component Value Date   TSH 2.60 03/15/2015        Component Value Date/Time   CHOL 212 (H) 05/18/2015 1219   CHOL 177 12/18/2012 0941   HDL 54.20 05/18/2015 1219   HDL 50 12/18/2012 0941   CHOLHDL 4 05/18/2015 1219   VLDL 45.0 (H) 05/18/2015 1219   LDLCALC 98 12/27/2014 0735   LDLCALC 90 12/18/2012 0941    Lab Results  Component Value Date   AST 15 12/12/2016   Lab Results  Component Value Date   ALT 17 12/12/2016   No components found for: ALKALINEPHOPHATASE No components found for: BILIRUBINTOTAL  No results found for: ESTRADIOL  Urinalysis See Epic and HPI.   I have reviewed the labs.   Pertinent Imaging: CLINICAL DATA:  Urinary tract infections  EXAM: RENAL / URINARY TRACT ULTRASOUND COMPLETE  COMPARISON:  CT abdomen and pelvis January 29, 2007  FINDINGS: Right Kidney:  Length: 10.4 cm. Echogenicity and renal cortical thickness are within normal limits. No mass, perinephric fluid, or hydronephrosis visualized. No sonographically demonstrable calculus or ureterectasis.  Left Kidney:  Length: 10.4 cm. Echogenicity and renal cortical thickness are within normal limits. No mass, perinephric fluid, or hydronephrosis visualized. No sonographically demonstrable calculus or ureterectasis.  Bladder:  Appears normal for degree of bladder distention.  IMPRESSION: Study within normal limits.   Electronically Signed   By: Lowella Grip III M.D.   On: 04/24/2017 09:03 I have independently reviewed the films.    Assessment & Plan:    1. Recurrent UTI's  - criteria for recurrent UTI has been met with 2 or more infections in 6 months or 3 or greater infections in one year   - patient is instructed to increase their water intake until the urine is pale yellow or clear (10 to 12 cups daily)   - patient is instructed to take probiotics (yogurt, oral pills or vaginal suppositories), take cranberry pills or drink the juice and Vitamin C 1,000 mg daily to acidify the urine   - avoid soaking in  tubs and wipe front to back after urinating   - advised them to have CATH UA's for urinalysis and culture to prevent skin, vaginal and/or rectal contamination of the specimen  - CATH UA is suspicious for infection - will send for culture - will hold on prescribing antibiotic at this time until urine culture and sensitivities are available as the burning with urination may be the result of the severe vaginal atrophy versus an UTI  2. Vaginal atrophy I explained to the patient that when women go through menopause and her estrogen levels are severely diminished, the normal vaginal flora will change.  This is due to an increase of the vaginal canal's pH. Because of this, the vaginal canal may be colonized by bacteria from the rectum instead of the protective lactobacillus.  This, accompanied by the loss of the mucus barrier with vaginal atrophy, is a cause of recurrent urinary tract infections. In some studies, the use of vaginal estrogen cream has been demonstrated to reduce  recurrent urinary tract infections to one a year.  Patient was given a sample of vaginal estrogen cream (Premarin vaginal cream) and instructed to apply 0.22m (pea-sized amount)  just inside the vaginal introitus with a finger-tip on Monday, Wednesday and Friday nights.  I explained to the patient that vaginally administered estrogen, which causes only a slight increase in the blood estrogen levels, have fewer contraindications and adverse systemic effects that oral HT. I have also given prescriptions for the Estrace cream and Premarin cream, so that the patient may carry them to the pharmacy to see which one of the branded creams would be most economical for her.  If she finds both medications cost prohibitive, she is instructed to call the office.  We can then call in a compounded vaginal estrogen cream for the patient that may be more affordable.   She will follow up in three months for an exam.    Information for recurrent UTI  prevention strategies and vaginal atrophy are given on the AVS.  Caretaker is to present them to patient's daughter and if daughter has any questions she is encouraged to call to speak with me.                                         Return for pending urine culture results .  These notes generated with voice recognition software. I apologize for typographical errors.  SZara Council PA-C  [facility] [address] [phone]

## 2017-06-04 ENCOUNTER — Ambulatory Visit: Payer: Medicare Other | Admitting: Urology

## 2017-06-04 ENCOUNTER — Encounter: Payer: Self-pay | Admitting: Urology

## 2017-06-04 VITALS — BP 137/70 | HR 74 | Ht 64.0 in | Wt 181.0 lb

## 2017-06-04 DIAGNOSIS — I495 Sick sinus syndrome: Secondary | ICD-10-CM | POA: Diagnosis not present

## 2017-06-04 DIAGNOSIS — N952 Postmenopausal atrophic vaginitis: Secondary | ICD-10-CM | POA: Diagnosis not present

## 2017-06-04 DIAGNOSIS — G43909 Migraine, unspecified, not intractable, without status migrainosus: Secondary | ICD-10-CM | POA: Diagnosis not present

## 2017-06-04 DIAGNOSIS — I129 Hypertensive chronic kidney disease with stage 1 through stage 4 chronic kidney disease, or unspecified chronic kidney disease: Secondary | ICD-10-CM | POA: Diagnosis not present

## 2017-06-04 DIAGNOSIS — M48061 Spinal stenosis, lumbar region without neurogenic claudication: Secondary | ICD-10-CM | POA: Diagnosis not present

## 2017-06-04 DIAGNOSIS — J449 Chronic obstructive pulmonary disease, unspecified: Secondary | ICD-10-CM | POA: Diagnosis not present

## 2017-06-04 DIAGNOSIS — M1990 Unspecified osteoarthritis, unspecified site: Secondary | ICD-10-CM | POA: Diagnosis not present

## 2017-06-04 DIAGNOSIS — E1140 Type 2 diabetes mellitus with diabetic neuropathy, unspecified: Secondary | ICD-10-CM | POA: Diagnosis not present

## 2017-06-04 DIAGNOSIS — I25119 Atherosclerotic heart disease of native coronary artery with unspecified angina pectoris: Secondary | ICD-10-CM | POA: Diagnosis not present

## 2017-06-04 DIAGNOSIS — N182 Chronic kidney disease, stage 2 (mild): Secondary | ICD-10-CM | POA: Diagnosis not present

## 2017-06-04 DIAGNOSIS — I480 Paroxysmal atrial fibrillation: Secondary | ICD-10-CM | POA: Diagnosis not present

## 2017-06-04 DIAGNOSIS — E1122 Type 2 diabetes mellitus with diabetic chronic kidney disease: Secondary | ICD-10-CM | POA: Diagnosis not present

## 2017-06-04 DIAGNOSIS — G4733 Obstructive sleep apnea (adult) (pediatric): Secondary | ICD-10-CM | POA: Diagnosis not present

## 2017-06-04 DIAGNOSIS — N390 Urinary tract infection, site not specified: Secondary | ICD-10-CM | POA: Diagnosis not present

## 2017-06-04 LAB — URINALYSIS, COMPLETE
Bilirubin, UA: NEGATIVE
Glucose, UA: NEGATIVE
Ketones, UA: NEGATIVE
Nitrite, UA: NEGATIVE
PH UA: 7 (ref 5.0–7.5)
Specific Gravity, UA: 1.02 (ref 1.005–1.030)
UUROB: 0.2 mg/dL (ref 0.2–1.0)

## 2017-06-04 LAB — MICROSCOPIC EXAMINATION: Epithelial Cells (non renal): NONE SEEN /hpf (ref 0–10)

## 2017-06-04 MED ORDER — ESTROGENS, CONJUGATED 0.625 MG/GM VA CREA: 1.0000 | TOPICAL_CREAM | Freq: Every day | VAGINAL | 12 refills | Status: DC

## 2017-06-04 MED ORDER — ESTRADIOL 0.1 MG/GM VA CREA: TOPICAL_CREAM | VAGINAL | 12 refills | Status: DC

## 2017-06-04 NOTE — Patient Instructions (Addendum)
I have given you two prescriptions for a vaginal estrogen cream.  Estrace and Premarin.  Please take these to your pharmacy and see which one your insurance covers.  If both are too expensive, please call the office at [phone] for an alternative.  You are given a sample of vaginal estrogen cream Premarin and instructed to apply 0.5mg  (pea-sized amount)  just inside the vaginal introitus with a finger-tip every night for two weeks then on Monday, Wednesday and Friday nights,                                                Urinary Tract Infection Prevention Patient Education Stay Hydrated: Urinary tract infections (UTIs) are less likely to occur in someone who is drinking enough water to promote regular urination, so it is very important to stay hydrated in order to help flush out bacteria from the urinary tract. Respond to "Nature's Call": It is always a good idea to urinate as soon as you feel the need. While "holding it in" does not directly cause an infection, it can cause overdistension that can damage the lining of the bladder, making it more vulnerable to bacteria. Remove Tampons Before Going: Remember to always take out tampons before urinating, and change tampons often.  Practice Proper Bathroom Hygiene: To keep bacteria near the urethral opening to a minimum, it is important to practice proper wiping techniques (i.e. front to back wiping) to help prevent rectal bacteria from entering the uretro-genital area. It can also be helpful to take showers and avoid soaking in the bathtub.  Take a Vitamin C Supplement: About 1,000 milligrams of vitamin C taken daily can help inhibit the growth of some bacteria by acidifying the urine. Maintain Control with Cranberries: Cranberries contain hippuronic acid, which is a natural antiseptic that may help prevent the adherence of bacteria to the bladder lining. Drinking 100% pure cranberry juice or taking over the counter cranberry supplements twice daily  may help to prevent an infection. However, it is important to note that cranberry juices/supplements are not helpful once a urinary tract infection (UTI) is present. Strengthen Your Core: Often, a lazy bladder (unable to empty urine properly) occurs due to lower back problem, so consider doing exercises to help strengthen your back, pelvic floor, and stomach muscles.  Pay Attention to Your Urine: Your urine can change color for a variety of reasons, including from the medications you take, so pay close attention to it to monitor your overall health. One key thing to note is that if your urine is typically a darker yellow, your body is dehydrated, so you need to step up your water intake.   Atrophic Vaginitis Atrophic vaginitis is a condition in which the tissues that line the vagina become dry and thin. This condition is most common in women who have stopped having regular menstrual periods (menopause). This usually starts when a woman is 38-94 years old. Estrogen helps to keep the vagina moist. It stimulates the vagina to produce a clear fluid that lubricates the vagina for sexual intercourse. This fluid also protects the vagina from infection. Lack of estrogen can cause the lining of the vagina to get thinner and dryer. The vagina may also shrink in size. It may become less elastic. Atrophic vaginitis tends to get worse over time as a woman's estrogen level drops. What are the causes? This  condition is caused by the normal drop in estrogen that happens around the time of menopause. What increases the risk? Certain conditions or situations may lower a woman's estrogen level, which increases her risk of atrophic vaginitis. These include:  Taking medicine that blocks estrogen.  Having ovaries removed surgically.  Being treated for cancer with X-ray treatment (radiation) or medicines (chemotherapy).  Exercising very hard and often.  Having an eating disorder (anorexia).  Giving birth or  breastfeeding.  Being over the age of 18.  Smoking.  What are the signs or symptoms? Symptoms of this condition include:  Pain, soreness, or bleeding during sexual intercourse (dyspareunia).  Vaginal burning, irritation, or itching.  Pain or bleeding during a vaginal examination using a speculum (pelvic exam).  Loss of interest in sexual activity.  Having burning pain when passing urine.  Vaginal discharge that is brown or yellow.  In some cases, there are no symptoms. How is this diagnosed? This condition is diagnosed with a medical history and physical exam. This will include a pelvic exam that checks whether the inside of your vagina appears pale, thin, or dry. Rarely, you may also have other tests, including:  A urine test.  A test that checks the acid balance in your vaginal fluid (acid balance test).  How is this treated? Treatment for this condition may depend on the severity of your symptoms. Treatment may include:  Using an over-the-counter vaginal lubricant before you have sexual intercourse.  Using a long-acting vaginal moisturizer.  Using low-dose vaginal estrogen for moderate to severe symptoms that do not respond to other treatments. Options include creams, tablets, and inserts (vaginal rings). Before using vaginal estrogen, tell your health care provider if you have a history of: ? Breast cancer. ? Endometrial cancer. ? Blood clots.  Taking medicines. You may be able to take a daily pill for dyspareunia. Discuss all of the risks of this medicine with your health care provider. It is usually not recommended for women who have a family history or personal history of breast cancer.  If your symptoms are very mild and you are not sexually active, you may not need treatment. Follow these instructions at home:  Take medicines only as directed by your health care provider. Do not use herbal or alternative medicines unless your health care provider says that you  can.  Use over-the-counter creams, lubricants, or moisturizers for dryness only as directed by your health care provider.  If your atrophic vaginitis is caused by menopause, discuss all of your menopausal symptoms and treatment options with your health care provider.  Do not douche.  Do not use products that can make your vagina dry. These include: ? Scented feminine sprays. ? Scented tampons. ? Scented soaps.  If it hurts to have sex, talk with your sexual partner. Contact a health care provider if:  Your discharge looks different than normal.  Your vagina has an unusual smell.  You have new symptoms.  Your symptoms do not improve with treatment.  Your symptoms get worse. This information is not intended to replace advice given to you by your health care provider. Make sure you discuss any questions you have with your health care provider. Document Released: 06/30/2014 Document Revised: 07/22/2015 Document Reviewed: 02/04/2014 Elsevier Interactive Patient Education  [employer].

## 2017-06-05 DIAGNOSIS — I129 Hypertensive chronic kidney disease with stage 1 through stage 4 chronic kidney disease, or unspecified chronic kidney disease: Secondary | ICD-10-CM | POA: Diagnosis not present

## 2017-06-05 DIAGNOSIS — I495 Sick sinus syndrome: Secondary | ICD-10-CM | POA: Diagnosis not present

## 2017-06-05 DIAGNOSIS — E1122 Type 2 diabetes mellitus with diabetic chronic kidney disease: Secondary | ICD-10-CM | POA: Diagnosis not present

## 2017-06-05 DIAGNOSIS — N182 Chronic kidney disease, stage 2 (mild): Secondary | ICD-10-CM | POA: Diagnosis not present

## 2017-06-05 DIAGNOSIS — M48061 Spinal stenosis, lumbar region without neurogenic claudication: Secondary | ICD-10-CM | POA: Diagnosis not present

## 2017-06-05 DIAGNOSIS — G4733 Obstructive sleep apnea (adult) (pediatric): Secondary | ICD-10-CM | POA: Diagnosis not present

## 2017-06-05 DIAGNOSIS — G43909 Migraine, unspecified, not intractable, without status migrainosus: Secondary | ICD-10-CM | POA: Diagnosis not present

## 2017-06-05 DIAGNOSIS — I25119 Atherosclerotic heart disease of native coronary artery with unspecified angina pectoris: Secondary | ICD-10-CM | POA: Diagnosis not present

## 2017-06-05 DIAGNOSIS — I480 Paroxysmal atrial fibrillation: Secondary | ICD-10-CM | POA: Diagnosis not present

## 2017-06-05 DIAGNOSIS — M1990 Unspecified osteoarthritis, unspecified site: Secondary | ICD-10-CM | POA: Diagnosis not present

## 2017-06-05 DIAGNOSIS — J449 Chronic obstructive pulmonary disease, unspecified: Secondary | ICD-10-CM | POA: Diagnosis not present

## 2017-06-05 DIAGNOSIS — E1140 Type 2 diabetes mellitus with diabetic neuropathy, unspecified: Secondary | ICD-10-CM | POA: Diagnosis not present

## 2017-06-06 DIAGNOSIS — E1140 Type 2 diabetes mellitus with diabetic neuropathy, unspecified: Secondary | ICD-10-CM | POA: Diagnosis not present

## 2017-06-06 DIAGNOSIS — I480 Paroxysmal atrial fibrillation: Secondary | ICD-10-CM | POA: Diagnosis not present

## 2017-06-06 DIAGNOSIS — E1122 Type 2 diabetes mellitus with diabetic chronic kidney disease: Secondary | ICD-10-CM | POA: Diagnosis not present

## 2017-06-06 DIAGNOSIS — I495 Sick sinus syndrome: Secondary | ICD-10-CM | POA: Diagnosis not present

## 2017-06-06 DIAGNOSIS — I25119 Atherosclerotic heart disease of native coronary artery with unspecified angina pectoris: Secondary | ICD-10-CM | POA: Diagnosis not present

## 2017-06-06 DIAGNOSIS — G4733 Obstructive sleep apnea (adult) (pediatric): Secondary | ICD-10-CM | POA: Diagnosis not present

## 2017-06-06 DIAGNOSIS — M48061 Spinal stenosis, lumbar region without neurogenic claudication: Secondary | ICD-10-CM | POA: Diagnosis not present

## 2017-06-06 DIAGNOSIS — M1990 Unspecified osteoarthritis, unspecified site: Secondary | ICD-10-CM | POA: Diagnosis not present

## 2017-06-06 DIAGNOSIS — J449 Chronic obstructive pulmonary disease, unspecified: Secondary | ICD-10-CM | POA: Diagnosis not present

## 2017-06-06 DIAGNOSIS — G43909 Migraine, unspecified, not intractable, without status migrainosus: Secondary | ICD-10-CM | POA: Diagnosis not present

## 2017-06-06 DIAGNOSIS — I129 Hypertensive chronic kidney disease with stage 1 through stage 4 chronic kidney disease, or unspecified chronic kidney disease: Secondary | ICD-10-CM | POA: Diagnosis not present

## 2017-06-06 DIAGNOSIS — N182 Chronic kidney disease, stage 2 (mild): Secondary | ICD-10-CM | POA: Diagnosis not present

## 2017-06-06 LAB — CULTURE, URINE COMPREHENSIVE

## 2017-06-07 ENCOUNTER — Telehealth: Payer: Self-pay

## 2017-06-07 DIAGNOSIS — I480 Paroxysmal atrial fibrillation: Secondary | ICD-10-CM | POA: Diagnosis not present

## 2017-06-07 DIAGNOSIS — I495 Sick sinus syndrome: Secondary | ICD-10-CM | POA: Diagnosis not present

## 2017-06-07 DIAGNOSIS — I25119 Atherosclerotic heart disease of native coronary artery with unspecified angina pectoris: Secondary | ICD-10-CM | POA: Diagnosis not present

## 2017-06-07 DIAGNOSIS — M48061 Spinal stenosis, lumbar region without neurogenic claudication: Secondary | ICD-10-CM | POA: Diagnosis not present

## 2017-06-07 DIAGNOSIS — J449 Chronic obstructive pulmonary disease, unspecified: Secondary | ICD-10-CM | POA: Diagnosis not present

## 2017-06-07 DIAGNOSIS — E1140 Type 2 diabetes mellitus with diabetic neuropathy, unspecified: Secondary | ICD-10-CM | POA: Diagnosis not present

## 2017-06-07 DIAGNOSIS — M1990 Unspecified osteoarthritis, unspecified site: Secondary | ICD-10-CM | POA: Diagnosis not present

## 2017-06-07 DIAGNOSIS — E1122 Type 2 diabetes mellitus with diabetic chronic kidney disease: Secondary | ICD-10-CM | POA: Diagnosis not present

## 2017-06-07 DIAGNOSIS — G4733 Obstructive sleep apnea (adult) (pediatric): Secondary | ICD-10-CM | POA: Diagnosis not present

## 2017-06-07 DIAGNOSIS — G43909 Migraine, unspecified, not intractable, without status migrainosus: Secondary | ICD-10-CM | POA: Diagnosis not present

## 2017-06-07 DIAGNOSIS — N182 Chronic kidney disease, stage 2 (mild): Secondary | ICD-10-CM | POA: Diagnosis not present

## 2017-06-07 DIAGNOSIS — I129 Hypertensive chronic kidney disease with stage 1 through stage 4 chronic kidney disease, or unspecified chronic kidney disease: Secondary | ICD-10-CM | POA: Diagnosis not present

## 2017-06-07 MED ORDER — AMOXICILLIN-POT CLAVULANATE 875-125 MG PO TABS: 1.0000 | ORAL_TABLET | Freq: Two times a day (BID) | ORAL | 0 refills | Status: AC

## 2017-06-07 NOTE — Telephone Encounter (Signed)
-----   Message from Nori Riis, PA-C sent at 06/06/2017  8:51 PM EDT ----- Please start Augmentin [phone]/125, one bid for seven days.

## 2017-06-07 NOTE — Telephone Encounter (Signed)
Spoke with pt daughter and made aware of abx. Daughter voiced understanding.

## 2017-06-07 NOTE — Telephone Encounter (Signed)
LMOM-medication sent to pharmacy 

## 2017-06-11 DIAGNOSIS — I495 Sick sinus syndrome: Secondary | ICD-10-CM | POA: Diagnosis not present

## 2017-06-11 DIAGNOSIS — J449 Chronic obstructive pulmonary disease, unspecified: Secondary | ICD-10-CM | POA: Diagnosis not present

## 2017-06-11 DIAGNOSIS — E1140 Type 2 diabetes mellitus with diabetic neuropathy, unspecified: Secondary | ICD-10-CM | POA: Diagnosis not present

## 2017-06-11 DIAGNOSIS — M48061 Spinal stenosis, lumbar region without neurogenic claudication: Secondary | ICD-10-CM | POA: Diagnosis not present

## 2017-06-11 DIAGNOSIS — I129 Hypertensive chronic kidney disease with stage 1 through stage 4 chronic kidney disease, or unspecified chronic kidney disease: Secondary | ICD-10-CM | POA: Diagnosis not present

## 2017-06-11 DIAGNOSIS — M1990 Unspecified osteoarthritis, unspecified site: Secondary | ICD-10-CM | POA: Diagnosis not present

## 2017-06-11 DIAGNOSIS — G4733 Obstructive sleep apnea (adult) (pediatric): Secondary | ICD-10-CM | POA: Diagnosis not present

## 2017-06-11 DIAGNOSIS — I480 Paroxysmal atrial fibrillation: Secondary | ICD-10-CM | POA: Diagnosis not present

## 2017-06-11 DIAGNOSIS — I25119 Atherosclerotic heart disease of native coronary artery with unspecified angina pectoris: Secondary | ICD-10-CM | POA: Diagnosis not present

## 2017-06-11 DIAGNOSIS — N182 Chronic kidney disease, stage 2 (mild): Secondary | ICD-10-CM | POA: Diagnosis not present

## 2017-06-11 DIAGNOSIS — G43909 Migraine, unspecified, not intractable, without status migrainosus: Secondary | ICD-10-CM | POA: Diagnosis not present

## 2017-06-11 DIAGNOSIS — E1122 Type 2 diabetes mellitus with diabetic chronic kidney disease: Secondary | ICD-10-CM | POA: Diagnosis not present

## 2017-06-12 DIAGNOSIS — I129 Hypertensive chronic kidney disease with stage 1 through stage 4 chronic kidney disease, or unspecified chronic kidney disease: Secondary | ICD-10-CM | POA: Diagnosis not present

## 2017-06-12 DIAGNOSIS — G43909 Migraine, unspecified, not intractable, without status migrainosus: Secondary | ICD-10-CM | POA: Diagnosis not present

## 2017-06-12 DIAGNOSIS — G4733 Obstructive sleep apnea (adult) (pediatric): Secondary | ICD-10-CM | POA: Diagnosis not present

## 2017-06-12 DIAGNOSIS — M48061 Spinal stenosis, lumbar region without neurogenic claudication: Secondary | ICD-10-CM | POA: Diagnosis not present

## 2017-06-12 DIAGNOSIS — E1140 Type 2 diabetes mellitus with diabetic neuropathy, unspecified: Secondary | ICD-10-CM | POA: Diagnosis not present

## 2017-06-12 DIAGNOSIS — N182 Chronic kidney disease, stage 2 (mild): Secondary | ICD-10-CM | POA: Diagnosis not present

## 2017-06-12 DIAGNOSIS — I25119 Atherosclerotic heart disease of native coronary artery with unspecified angina pectoris: Secondary | ICD-10-CM | POA: Diagnosis not present

## 2017-06-12 DIAGNOSIS — I480 Paroxysmal atrial fibrillation: Secondary | ICD-10-CM | POA: Diagnosis not present

## 2017-06-12 DIAGNOSIS — I495 Sick sinus syndrome: Secondary | ICD-10-CM | POA: Diagnosis not present

## 2017-06-12 DIAGNOSIS — E1122 Type 2 diabetes mellitus with diabetic chronic kidney disease: Secondary | ICD-10-CM | POA: Diagnosis not present

## 2017-06-12 DIAGNOSIS — J449 Chronic obstructive pulmonary disease, unspecified: Secondary | ICD-10-CM | POA: Diagnosis not present

## 2017-06-12 DIAGNOSIS — M1990 Unspecified osteoarthritis, unspecified site: Secondary | ICD-10-CM | POA: Diagnosis not present

## 2017-06-13 DIAGNOSIS — M5416 Radiculopathy, lumbar region: Secondary | ICD-10-CM | POA: Diagnosis not present

## 2017-06-14 ENCOUNTER — Telehealth: Payer: Self-pay | Admitting: Urology

## 2017-06-14 NOTE — Telephone Encounter (Signed)
LMOM for patient to call back to schedule a 1 month follow up appointment for Cath UA, UCX if symptoms, also to make sure she was able to get the Estrogen filled

## 2017-06-14 NOTE — Telephone Encounter (Signed)
Patient will need an office visit in one month for a follow up.  If she is still having symptoms of an UTI after she completes her antibiotic, she will need a CATH UA and culture.  Were they able to get the vaginal estrogen filled?

## 2017-06-15 DIAGNOSIS — E1122 Type 2 diabetes mellitus with diabetic chronic kidney disease: Secondary | ICD-10-CM | POA: Diagnosis not present

## 2017-06-15 DIAGNOSIS — J449 Chronic obstructive pulmonary disease, unspecified: Secondary | ICD-10-CM | POA: Diagnosis not present

## 2017-06-15 DIAGNOSIS — E1140 Type 2 diabetes mellitus with diabetic neuropathy, unspecified: Secondary | ICD-10-CM | POA: Diagnosis not present

## 2017-06-15 DIAGNOSIS — I129 Hypertensive chronic kidney disease with stage 1 through stage 4 chronic kidney disease, or unspecified chronic kidney disease: Secondary | ICD-10-CM | POA: Diagnosis not present

## 2017-06-15 DIAGNOSIS — G43909 Migraine, unspecified, not intractable, without status migrainosus: Secondary | ICD-10-CM | POA: Diagnosis not present

## 2017-06-15 DIAGNOSIS — M1990 Unspecified osteoarthritis, unspecified site: Secondary | ICD-10-CM | POA: Diagnosis not present

## 2017-06-15 DIAGNOSIS — N182 Chronic kidney disease, stage 2 (mild): Secondary | ICD-10-CM | POA: Diagnosis not present

## 2017-06-15 DIAGNOSIS — I25119 Atherosclerotic heart disease of native coronary artery with unspecified angina pectoris: Secondary | ICD-10-CM | POA: Diagnosis not present

## 2017-06-15 DIAGNOSIS — G4733 Obstructive sleep apnea (adult) (pediatric): Secondary | ICD-10-CM | POA: Diagnosis not present

## 2017-06-15 DIAGNOSIS — M48061 Spinal stenosis, lumbar region without neurogenic claudication: Secondary | ICD-10-CM | POA: Diagnosis not present

## 2017-06-15 DIAGNOSIS — I495 Sick sinus syndrome: Secondary | ICD-10-CM | POA: Diagnosis not present

## 2017-06-15 DIAGNOSIS — I480 Paroxysmal atrial fibrillation: Secondary | ICD-10-CM | POA: Diagnosis not present

## 2017-06-19 DIAGNOSIS — E1140 Type 2 diabetes mellitus with diabetic neuropathy, unspecified: Secondary | ICD-10-CM | POA: Diagnosis not present

## 2017-06-19 DIAGNOSIS — G4733 Obstructive sleep apnea (adult) (pediatric): Secondary | ICD-10-CM | POA: Diagnosis not present

## 2017-06-19 DIAGNOSIS — M1990 Unspecified osteoarthritis, unspecified site: Secondary | ICD-10-CM | POA: Diagnosis not present

## 2017-06-19 DIAGNOSIS — E1122 Type 2 diabetes mellitus with diabetic chronic kidney disease: Secondary | ICD-10-CM | POA: Diagnosis not present

## 2017-06-19 DIAGNOSIS — I25119 Atherosclerotic heart disease of native coronary artery with unspecified angina pectoris: Secondary | ICD-10-CM | POA: Diagnosis not present

## 2017-06-19 DIAGNOSIS — J449 Chronic obstructive pulmonary disease, unspecified: Secondary | ICD-10-CM | POA: Diagnosis not present

## 2017-06-19 DIAGNOSIS — I495 Sick sinus syndrome: Secondary | ICD-10-CM | POA: Diagnosis not present

## 2017-06-19 DIAGNOSIS — I129 Hypertensive chronic kidney disease with stage 1 through stage 4 chronic kidney disease, or unspecified chronic kidney disease: Secondary | ICD-10-CM | POA: Diagnosis not present

## 2017-06-19 DIAGNOSIS — M48061 Spinal stenosis, lumbar region without neurogenic claudication: Secondary | ICD-10-CM | POA: Diagnosis not present

## 2017-06-19 DIAGNOSIS — I480 Paroxysmal atrial fibrillation: Secondary | ICD-10-CM | POA: Diagnosis not present

## 2017-06-19 DIAGNOSIS — G43909 Migraine, unspecified, not intractable, without status migrainosus: Secondary | ICD-10-CM | POA: Diagnosis not present

## 2017-06-19 DIAGNOSIS — N182 Chronic kidney disease, stage 2 (mild): Secondary | ICD-10-CM | POA: Diagnosis not present

## 2017-06-20 DIAGNOSIS — M1711 Unilateral primary osteoarthritis, right knee: Secondary | ICD-10-CM | POA: Diagnosis not present

## 2017-06-21 DIAGNOSIS — M1990 Unspecified osteoarthritis, unspecified site: Secondary | ICD-10-CM | POA: Diagnosis not present

## 2017-06-21 DIAGNOSIS — I129 Hypertensive chronic kidney disease with stage 1 through stage 4 chronic kidney disease, or unspecified chronic kidney disease: Secondary | ICD-10-CM | POA: Diagnosis not present

## 2017-06-21 DIAGNOSIS — G4733 Obstructive sleep apnea (adult) (pediatric): Secondary | ICD-10-CM | POA: Diagnosis not present

## 2017-06-21 DIAGNOSIS — I480 Paroxysmal atrial fibrillation: Secondary | ICD-10-CM | POA: Diagnosis not present

## 2017-06-21 DIAGNOSIS — M48061 Spinal stenosis, lumbar region without neurogenic claudication: Secondary | ICD-10-CM | POA: Diagnosis not present

## 2017-06-21 DIAGNOSIS — E1122 Type 2 diabetes mellitus with diabetic chronic kidney disease: Secondary | ICD-10-CM | POA: Diagnosis not present

## 2017-06-21 DIAGNOSIS — N182 Chronic kidney disease, stage 2 (mild): Secondary | ICD-10-CM | POA: Diagnosis not present

## 2017-06-21 DIAGNOSIS — E1140 Type 2 diabetes mellitus with diabetic neuropathy, unspecified: Secondary | ICD-10-CM | POA: Diagnosis not present

## 2017-06-21 DIAGNOSIS — J449 Chronic obstructive pulmonary disease, unspecified: Secondary | ICD-10-CM | POA: Diagnosis not present

## 2017-06-21 DIAGNOSIS — G43909 Migraine, unspecified, not intractable, without status migrainosus: Secondary | ICD-10-CM | POA: Diagnosis not present

## 2017-06-21 DIAGNOSIS — I25119 Atherosclerotic heart disease of native coronary artery with unspecified angina pectoris: Secondary | ICD-10-CM | POA: Diagnosis not present

## 2017-06-21 DIAGNOSIS — I495 Sick sinus syndrome: Secondary | ICD-10-CM | POA: Diagnosis not present

## 2017-06-25 DIAGNOSIS — M1990 Unspecified osteoarthritis, unspecified site: Secondary | ICD-10-CM | POA: Diagnosis not present

## 2017-06-25 DIAGNOSIS — E1122 Type 2 diabetes mellitus with diabetic chronic kidney disease: Secondary | ICD-10-CM | POA: Diagnosis not present

## 2017-06-25 DIAGNOSIS — G4733 Obstructive sleep apnea (adult) (pediatric): Secondary | ICD-10-CM | POA: Diagnosis not present

## 2017-06-25 DIAGNOSIS — I25119 Atherosclerotic heart disease of native coronary artery with unspecified angina pectoris: Secondary | ICD-10-CM | POA: Diagnosis not present

## 2017-06-25 DIAGNOSIS — I480 Paroxysmal atrial fibrillation: Secondary | ICD-10-CM | POA: Diagnosis not present

## 2017-06-25 DIAGNOSIS — N182 Chronic kidney disease, stage 2 (mild): Secondary | ICD-10-CM | POA: Diagnosis not present

## 2017-06-25 DIAGNOSIS — J449 Chronic obstructive pulmonary disease, unspecified: Secondary | ICD-10-CM | POA: Diagnosis not present

## 2017-06-25 DIAGNOSIS — I129 Hypertensive chronic kidney disease with stage 1 through stage 4 chronic kidney disease, or unspecified chronic kidney disease: Secondary | ICD-10-CM | POA: Diagnosis not present

## 2017-06-25 DIAGNOSIS — E1140 Type 2 diabetes mellitus with diabetic neuropathy, unspecified: Secondary | ICD-10-CM | POA: Diagnosis not present

## 2017-06-25 DIAGNOSIS — M48061 Spinal stenosis, lumbar region without neurogenic claudication: Secondary | ICD-10-CM | POA: Diagnosis not present

## 2017-06-25 DIAGNOSIS — G43909 Migraine, unspecified, not intractable, without status migrainosus: Secondary | ICD-10-CM | POA: Diagnosis not present

## 2017-06-25 DIAGNOSIS — I495 Sick sinus syndrome: Secondary | ICD-10-CM | POA: Diagnosis not present

## 2017-06-26 DIAGNOSIS — N182 Chronic kidney disease, stage 2 (mild): Secondary | ICD-10-CM | POA: Diagnosis not present

## 2017-06-26 DIAGNOSIS — I129 Hypertensive chronic kidney disease with stage 1 through stage 4 chronic kidney disease, or unspecified chronic kidney disease: Secondary | ICD-10-CM | POA: Diagnosis not present

## 2017-06-26 DIAGNOSIS — I480 Paroxysmal atrial fibrillation: Secondary | ICD-10-CM | POA: Diagnosis not present

## 2017-06-26 DIAGNOSIS — I25119 Atherosclerotic heart disease of native coronary artery with unspecified angina pectoris: Secondary | ICD-10-CM | POA: Diagnosis not present

## 2017-06-26 DIAGNOSIS — M48061 Spinal stenosis, lumbar region without neurogenic claudication: Secondary | ICD-10-CM | POA: Diagnosis not present

## 2017-06-26 DIAGNOSIS — G4733 Obstructive sleep apnea (adult) (pediatric): Secondary | ICD-10-CM | POA: Diagnosis not present

## 2017-06-26 DIAGNOSIS — I495 Sick sinus syndrome: Secondary | ICD-10-CM | POA: Diagnosis not present

## 2017-06-26 DIAGNOSIS — E1122 Type 2 diabetes mellitus with diabetic chronic kidney disease: Secondary | ICD-10-CM | POA: Diagnosis not present

## 2017-06-26 DIAGNOSIS — E1140 Type 2 diabetes mellitus with diabetic neuropathy, unspecified: Secondary | ICD-10-CM | POA: Diagnosis not present

## 2017-06-26 DIAGNOSIS — G43909 Migraine, unspecified, not intractable, without status migrainosus: Secondary | ICD-10-CM | POA: Diagnosis not present

## 2017-06-26 DIAGNOSIS — J449 Chronic obstructive pulmonary disease, unspecified: Secondary | ICD-10-CM | POA: Diagnosis not present

## 2017-06-26 DIAGNOSIS — M1990 Unspecified osteoarthritis, unspecified site: Secondary | ICD-10-CM | POA: Diagnosis not present

## 2017-06-27 DIAGNOSIS — N182 Chronic kidney disease, stage 2 (mild): Secondary | ICD-10-CM | POA: Diagnosis not present

## 2017-06-27 DIAGNOSIS — G4733 Obstructive sleep apnea (adult) (pediatric): Secondary | ICD-10-CM | POA: Diagnosis not present

## 2017-06-27 DIAGNOSIS — I480 Paroxysmal atrial fibrillation: Secondary | ICD-10-CM | POA: Diagnosis not present

## 2017-06-27 DIAGNOSIS — I129 Hypertensive chronic kidney disease with stage 1 through stage 4 chronic kidney disease, or unspecified chronic kidney disease: Secondary | ICD-10-CM | POA: Diagnosis not present

## 2017-06-27 DIAGNOSIS — E1122 Type 2 diabetes mellitus with diabetic chronic kidney disease: Secondary | ICD-10-CM | POA: Diagnosis not present

## 2017-06-27 DIAGNOSIS — I25119 Atherosclerotic heart disease of native coronary artery with unspecified angina pectoris: Secondary | ICD-10-CM | POA: Diagnosis not present

## 2017-06-27 DIAGNOSIS — G43909 Migraine, unspecified, not intractable, without status migrainosus: Secondary | ICD-10-CM | POA: Diagnosis not present

## 2017-06-27 DIAGNOSIS — E1140 Type 2 diabetes mellitus with diabetic neuropathy, unspecified: Secondary | ICD-10-CM | POA: Diagnosis not present

## 2017-06-27 DIAGNOSIS — I495 Sick sinus syndrome: Secondary | ICD-10-CM | POA: Diagnosis not present

## 2017-06-27 DIAGNOSIS — J449 Chronic obstructive pulmonary disease, unspecified: Secondary | ICD-10-CM | POA: Diagnosis not present

## 2017-06-27 DIAGNOSIS — M48061 Spinal stenosis, lumbar region without neurogenic claudication: Secondary | ICD-10-CM | POA: Diagnosis not present

## 2017-06-27 DIAGNOSIS — I6930 Unspecified sequelae of cerebral infarction: Secondary | ICD-10-CM | POA: Insufficient documentation

## 2017-06-27 DIAGNOSIS — M1990 Unspecified osteoarthritis, unspecified site: Secondary | ICD-10-CM | POA: Diagnosis not present

## 2017-06-29 ENCOUNTER — Telehealth: Payer: Self-pay

## 2017-06-29 DIAGNOSIS — M1990 Unspecified osteoarthritis, unspecified site: Secondary | ICD-10-CM | POA: Diagnosis not present

## 2017-06-29 DIAGNOSIS — J449 Chronic obstructive pulmonary disease, unspecified: Secondary | ICD-10-CM | POA: Diagnosis not present

## 2017-06-29 DIAGNOSIS — I129 Hypertensive chronic kidney disease with stage 1 through stage 4 chronic kidney disease, or unspecified chronic kidney disease: Secondary | ICD-10-CM | POA: Diagnosis not present

## 2017-06-29 DIAGNOSIS — I480 Paroxysmal atrial fibrillation: Secondary | ICD-10-CM | POA: Diagnosis not present

## 2017-06-29 DIAGNOSIS — I25119 Atherosclerotic heart disease of native coronary artery with unspecified angina pectoris: Secondary | ICD-10-CM | POA: Diagnosis not present

## 2017-06-29 DIAGNOSIS — G43909 Migraine, unspecified, not intractable, without status migrainosus: Secondary | ICD-10-CM | POA: Diagnosis not present

## 2017-06-29 DIAGNOSIS — E1122 Type 2 diabetes mellitus with diabetic chronic kidney disease: Secondary | ICD-10-CM | POA: Diagnosis not present

## 2017-06-29 DIAGNOSIS — N182 Chronic kidney disease, stage 2 (mild): Secondary | ICD-10-CM | POA: Diagnosis not present

## 2017-06-29 DIAGNOSIS — E1140 Type 2 diabetes mellitus with diabetic neuropathy, unspecified: Secondary | ICD-10-CM | POA: Diagnosis not present

## 2017-06-29 DIAGNOSIS — I495 Sick sinus syndrome: Secondary | ICD-10-CM | POA: Diagnosis not present

## 2017-06-29 DIAGNOSIS — M48061 Spinal stenosis, lumbar region without neurogenic claudication: Secondary | ICD-10-CM | POA: Diagnosis not present

## 2017-06-29 DIAGNOSIS — G4733 Obstructive sleep apnea (adult) (pediatric): Secondary | ICD-10-CM | POA: Diagnosis not present

## 2017-06-29 NOTE — Telephone Encounter (Signed)
Called and gave verbal order to [facility]) for patient to have in& out cath per Mable Paris.

## 2017-06-29 NOTE — Telephone Encounter (Signed)
Please give verbal okay

## 2017-06-29 NOTE — Telephone Encounter (Signed)
Copied from [place] [phone]. Topic: Quick Communication - See Telephone Encounter >> Jun 29, 2017 12:04 PM Hewitt Shorts wrote: Luanne Bras with [facility] calling to get verbal orders to do either a clean catch urine or in and out catheter pt has a history of  uri and pt is having  Burning and urgency best number ti call kelly at [phone]

## 2017-06-30 DIAGNOSIS — M1990 Unspecified osteoarthritis, unspecified site: Secondary | ICD-10-CM | POA: Diagnosis not present

## 2017-06-30 DIAGNOSIS — G43909 Migraine, unspecified, not intractable, without status migrainosus: Secondary | ICD-10-CM | POA: Diagnosis not present

## 2017-06-30 DIAGNOSIS — I480 Paroxysmal atrial fibrillation: Secondary | ICD-10-CM | POA: Diagnosis not present

## 2017-06-30 DIAGNOSIS — J449 Chronic obstructive pulmonary disease, unspecified: Secondary | ICD-10-CM | POA: Diagnosis not present

## 2017-06-30 DIAGNOSIS — M48061 Spinal stenosis, lumbar region without neurogenic claudication: Secondary | ICD-10-CM | POA: Diagnosis not present

## 2017-06-30 DIAGNOSIS — I129 Hypertensive chronic kidney disease with stage 1 through stage 4 chronic kidney disease, or unspecified chronic kidney disease: Secondary | ICD-10-CM | POA: Diagnosis not present

## 2017-06-30 DIAGNOSIS — I25119 Atherosclerotic heart disease of native coronary artery with unspecified angina pectoris: Secondary | ICD-10-CM | POA: Diagnosis not present

## 2017-06-30 DIAGNOSIS — G4733 Obstructive sleep apnea (adult) (pediatric): Secondary | ICD-10-CM | POA: Diagnosis not present

## 2017-06-30 DIAGNOSIS — E1140 Type 2 diabetes mellitus with diabetic neuropathy, unspecified: Secondary | ICD-10-CM | POA: Diagnosis not present

## 2017-06-30 DIAGNOSIS — E1122 Type 2 diabetes mellitus with diabetic chronic kidney disease: Secondary | ICD-10-CM | POA: Diagnosis not present

## 2017-06-30 DIAGNOSIS — I495 Sick sinus syndrome: Secondary | ICD-10-CM | POA: Diagnosis not present

## 2017-06-30 DIAGNOSIS — N182 Chronic kidney disease, stage 2 (mild): Secondary | ICD-10-CM | POA: Diagnosis not present

## 2017-07-03 DIAGNOSIS — I129 Hypertensive chronic kidney disease with stage 1 through stage 4 chronic kidney disease, or unspecified chronic kidney disease: Secondary | ICD-10-CM | POA: Diagnosis not present

## 2017-07-03 DIAGNOSIS — E1140 Type 2 diabetes mellitus with diabetic neuropathy, unspecified: Secondary | ICD-10-CM | POA: Diagnosis not present

## 2017-07-03 DIAGNOSIS — E1122 Type 2 diabetes mellitus with diabetic chronic kidney disease: Secondary | ICD-10-CM | POA: Diagnosis not present

## 2017-07-03 DIAGNOSIS — M48061 Spinal stenosis, lumbar region without neurogenic claudication: Secondary | ICD-10-CM | POA: Diagnosis not present

## 2017-07-03 DIAGNOSIS — I25119 Atherosclerotic heart disease of native coronary artery with unspecified angina pectoris: Secondary | ICD-10-CM | POA: Diagnosis not present

## 2017-07-03 DIAGNOSIS — I495 Sick sinus syndrome: Secondary | ICD-10-CM | POA: Diagnosis not present

## 2017-07-03 DIAGNOSIS — J449 Chronic obstructive pulmonary disease, unspecified: Secondary | ICD-10-CM | POA: Diagnosis not present

## 2017-07-03 DIAGNOSIS — G4733 Obstructive sleep apnea (adult) (pediatric): Secondary | ICD-10-CM | POA: Diagnosis not present

## 2017-07-03 DIAGNOSIS — M1990 Unspecified osteoarthritis, unspecified site: Secondary | ICD-10-CM | POA: Diagnosis not present

## 2017-07-03 DIAGNOSIS — I480 Paroxysmal atrial fibrillation: Secondary | ICD-10-CM | POA: Diagnosis not present

## 2017-07-03 DIAGNOSIS — G43909 Migraine, unspecified, not intractable, without status migrainosus: Secondary | ICD-10-CM | POA: Diagnosis not present

## 2017-07-03 DIAGNOSIS — N182 Chronic kidney disease, stage 2 (mild): Secondary | ICD-10-CM | POA: Diagnosis not present

## 2017-07-04 ENCOUNTER — Telehealth: Payer: Self-pay

## 2017-07-04 DIAGNOSIS — E1122 Type 2 diabetes mellitus with diabetic chronic kidney disease: Secondary | ICD-10-CM | POA: Diagnosis not present

## 2017-07-04 DIAGNOSIS — I495 Sick sinus syndrome: Secondary | ICD-10-CM | POA: Diagnosis not present

## 2017-07-04 DIAGNOSIS — I25119 Atherosclerotic heart disease of native coronary artery with unspecified angina pectoris: Secondary | ICD-10-CM | POA: Diagnosis not present

## 2017-07-04 DIAGNOSIS — E1140 Type 2 diabetes mellitus with diabetic neuropathy, unspecified: Secondary | ICD-10-CM | POA: Diagnosis not present

## 2017-07-04 DIAGNOSIS — I129 Hypertensive chronic kidney disease with stage 1 through stage 4 chronic kidney disease, or unspecified chronic kidney disease: Secondary | ICD-10-CM | POA: Diagnosis not present

## 2017-07-04 DIAGNOSIS — M48061 Spinal stenosis, lumbar region without neurogenic claudication: Secondary | ICD-10-CM | POA: Diagnosis not present

## 2017-07-04 DIAGNOSIS — N182 Chronic kidney disease, stage 2 (mild): Secondary | ICD-10-CM | POA: Diagnosis not present

## 2017-07-04 DIAGNOSIS — J449 Chronic obstructive pulmonary disease, unspecified: Secondary | ICD-10-CM | POA: Diagnosis not present

## 2017-07-04 DIAGNOSIS — M1990 Unspecified osteoarthritis, unspecified site: Secondary | ICD-10-CM | POA: Diagnosis not present

## 2017-07-04 DIAGNOSIS — G43909 Migraine, unspecified, not intractable, without status migrainosus: Secondary | ICD-10-CM | POA: Diagnosis not present

## 2017-07-04 DIAGNOSIS — G4733 Obstructive sleep apnea (adult) (pediatric): Secondary | ICD-10-CM | POA: Diagnosis not present

## 2017-07-04 DIAGNOSIS — I480 Paroxysmal atrial fibrillation: Secondary | ICD-10-CM | POA: Diagnosis not present

## 2017-07-04 NOTE — Telephone Encounter (Signed)
patient sees urology for reoccurring utis. Merry Proud at [facility] states they did a UA on 06/29/17 per orders from [facility]. Patient is having burning with urination and frequent urination including incontinence.

## 2017-07-04 NOTE — Telephone Encounter (Signed)
Copied from [place] [phone]. Topic: General - Other >> Jul 04, 2017  3:46 PM Yvette Rack wrote: Reason for CRM: Merry Proud with [facility] called in requesting a return call. Merry Proud states the pt and her family wants to know if Dr.Sonnenberg would like to start the pt on any medications. Please return call. Cb# [phone]

## 2017-07-04 NOTE — Telephone Encounter (Signed)
Please see if we can track down the urine culture results. If we can get the results I would consider treating without evaluation. If we are unable to get the results she will need to be evaluated.  Thanks.

## 2017-07-05 ENCOUNTER — Other Ambulatory Visit: Payer: Self-pay

## 2017-07-05 ENCOUNTER — Inpatient Hospital Stay
Admission: EM | Admit: 2017-07-05 | Discharge: 2017-07-09 | DRG: 690 | Disposition: A | Discharge status: Home Health | Payer: Medicare Other | Attending: Internal Medicine | Admitting: Internal Medicine

## 2017-07-05 ENCOUNTER — Encounter: Payer: Self-pay | Admitting: Emergency Medicine

## 2017-07-05 DIAGNOSIS — B961 Klebsiella pneumoniae [K. pneumoniae] as the cause of diseases classified elsewhere: Secondary | ICD-10-CM | POA: Diagnosis present

## 2017-07-05 DIAGNOSIS — H409 Unspecified glaucoma: Secondary | ICD-10-CM | POA: Diagnosis present

## 2017-07-05 DIAGNOSIS — Z9842 Cataract extraction status, left eye: Secondary | ICD-10-CM | POA: Diagnosis not present

## 2017-07-05 DIAGNOSIS — I495 Sick sinus syndrome: Secondary | ICD-10-CM | POA: Diagnosis not present

## 2017-07-05 DIAGNOSIS — F419 Anxiety disorder, unspecified: Secondary | ICD-10-CM | POA: Diagnosis present

## 2017-07-05 DIAGNOSIS — H2589 Other age-related cataract: Secondary | ICD-10-CM | POA: Diagnosis not present

## 2017-07-05 DIAGNOSIS — N390 Urinary tract infection, site not specified: Secondary | ICD-10-CM | POA: Diagnosis present

## 2017-07-05 DIAGNOSIS — N182 Chronic kidney disease, stage 2 (mild): Secondary | ICD-10-CM | POA: Diagnosis not present

## 2017-07-05 DIAGNOSIS — E119 Type 2 diabetes mellitus without complications: Secondary | ICD-10-CM | POA: Diagnosis not present

## 2017-07-05 DIAGNOSIS — G8929 Other chronic pain: Secondary | ICD-10-CM | POA: Diagnosis not present

## 2017-07-05 DIAGNOSIS — Z1624 Resistance to multiple antibiotics: Secondary | ICD-10-CM | POA: Diagnosis present

## 2017-07-05 DIAGNOSIS — Z1612 Extended spectrum beta lactamase (ESBL) resistance: Secondary | ICD-10-CM | POA: Diagnosis present

## 2017-07-05 DIAGNOSIS — Z79890 Hormone replacement therapy (postmenopausal): Secondary | ICD-10-CM

## 2017-07-05 DIAGNOSIS — Z825 Family history of asthma and other chronic lower respiratory diseases: Secondary | ICD-10-CM

## 2017-07-05 DIAGNOSIS — I2510 Atherosclerotic heart disease of native coronary artery without angina pectoris: Secondary | ICD-10-CM | POA: Diagnosis present

## 2017-07-05 DIAGNOSIS — I480 Paroxysmal atrial fibrillation: Secondary | ICD-10-CM | POA: Diagnosis present

## 2017-07-05 DIAGNOSIS — Z7401 Bed confinement status: Secondary | ICD-10-CM | POA: Diagnosis not present

## 2017-07-05 DIAGNOSIS — I129 Hypertensive chronic kidney disease with stage 1 through stage 4 chronic kidney disease, or unspecified chronic kidney disease: Secondary | ICD-10-CM | POA: Diagnosis present

## 2017-07-05 DIAGNOSIS — E559 Vitamin D deficiency, unspecified: Secondary | ICD-10-CM | POA: Diagnosis not present

## 2017-07-05 DIAGNOSIS — G2581 Restless legs syndrome: Secondary | ICD-10-CM | POA: Diagnosis not present

## 2017-07-05 DIAGNOSIS — N183 Chronic kidney disease, stage 3 (moderate): Secondary | ICD-10-CM | POA: Diagnosis not present

## 2017-07-05 DIAGNOSIS — Z8673 Personal history of transient ischemic attack (TIA), and cerebral infarction without residual deficits: Secondary | ICD-10-CM

## 2017-07-05 DIAGNOSIS — Z955 Presence of coronary angioplasty implant and graft: Secondary | ICD-10-CM | POA: Diagnosis not present

## 2017-07-05 DIAGNOSIS — Z66 Do not resuscitate: Secondary | ICD-10-CM | POA: Diagnosis present

## 2017-07-05 DIAGNOSIS — Z90710 Acquired absence of both cervix and uterus: Secondary | ICD-10-CM

## 2017-07-05 DIAGNOSIS — Z9841 Cataract extraction status, right eye: Secondary | ICD-10-CM

## 2017-07-05 DIAGNOSIS — K5900 Constipation, unspecified: Secondary | ICD-10-CM | POA: Diagnosis present

## 2017-07-05 DIAGNOSIS — E7849 Other hyperlipidemia: Secondary | ICD-10-CM | POA: Diagnosis not present

## 2017-07-05 DIAGNOSIS — E785 Hyperlipidemia, unspecified: Secondary | ICD-10-CM | POA: Diagnosis not present

## 2017-07-05 DIAGNOSIS — R300 Dysuria: Secondary | ICD-10-CM | POA: Diagnosis not present

## 2017-07-05 DIAGNOSIS — R339 Retention of urine, unspecified: Secondary | ICD-10-CM | POA: Diagnosis not present

## 2017-07-05 DIAGNOSIS — M150 Primary generalized (osteo)arthritis: Secondary | ICD-10-CM | POA: Diagnosis not present

## 2017-07-05 DIAGNOSIS — M6281 Muscle weakness (generalized): Secondary | ICD-10-CM | POA: Diagnosis not present

## 2017-07-05 DIAGNOSIS — K219 Gastro-esophageal reflux disease without esophagitis: Secondary | ICD-10-CM | POA: Diagnosis present

## 2017-07-05 DIAGNOSIS — E1122 Type 2 diabetes mellitus with diabetic chronic kidney disease: Secondary | ICD-10-CM | POA: Diagnosis present

## 2017-07-05 DIAGNOSIS — J302 Other seasonal allergic rhinitis: Secondary | ICD-10-CM | POA: Diagnosis present

## 2017-07-05 DIAGNOSIS — N3000 Acute cystitis without hematuria: Secondary | ICD-10-CM | POA: Diagnosis not present

## 2017-07-05 DIAGNOSIS — I4589 Other specified conduction disorders: Secondary | ICD-10-CM | POA: Diagnosis present

## 2017-07-05 DIAGNOSIS — Z882 Allergy status to sulfonamides status: Secondary | ICD-10-CM

## 2017-07-05 DIAGNOSIS — R531 Weakness: Secondary | ICD-10-CM

## 2017-07-05 DIAGNOSIS — Z87440 Personal history of urinary (tract) infections: Secondary | ICD-10-CM | POA: Diagnosis not present

## 2017-07-05 DIAGNOSIS — Z881 Allergy status to other antibiotic agents status: Secondary | ICD-10-CM

## 2017-07-05 DIAGNOSIS — B9629 Other Escherichia coli [E. coli] as the cause of diseases classified elsewhere: Secondary | ICD-10-CM

## 2017-07-05 DIAGNOSIS — G4733 Obstructive sleep apnea (adult) (pediatric): Secondary | ICD-10-CM | POA: Diagnosis present

## 2017-07-05 DIAGNOSIS — I10 Essential (primary) hypertension: Secondary | ICD-10-CM | POA: Diagnosis not present

## 2017-07-05 DIAGNOSIS — Z888 Allergy status to other drugs, medicaments and biological substances status: Secondary | ICD-10-CM

## 2017-07-05 DIAGNOSIS — Z79891 Long term (current) use of opiate analgesic: Secondary | ICD-10-CM

## 2017-07-05 DIAGNOSIS — E1140 Type 2 diabetes mellitus with diabetic neuropathy, unspecified: Secondary | ICD-10-CM | POA: Diagnosis not present

## 2017-07-05 DIAGNOSIS — E860 Dehydration: Secondary | ICD-10-CM | POA: Diagnosis not present

## 2017-07-05 DIAGNOSIS — J45909 Unspecified asthma, uncomplicated: Secondary | ICD-10-CM | POA: Diagnosis not present

## 2017-07-05 DIAGNOSIS — Z79899 Other long term (current) drug therapy: Secondary | ICD-10-CM

## 2017-07-05 DIAGNOSIS — Z8249 Family history of ischemic heart disease and other diseases of the circulatory system: Secondary | ICD-10-CM

## 2017-07-05 DIAGNOSIS — Z794 Long term (current) use of insulin: Secondary | ICD-10-CM

## 2017-07-05 LAB — CBC
HEMATOCRIT: 36.8 % (ref 35.0–47.0)
Hemoglobin: 12.2 g/dL (ref 12.0–16.0)
MCH: 31.3 pg (ref 26.0–34.0)
MCHC: 33.1 g/dL (ref 32.0–36.0)
MCV: 94.4 fL (ref 80.0–100.0)
Platelets: 319 10*3/uL (ref 150–440)
RBC: 3.9 MIL/uL (ref 3.80–5.20)
RDW: 14.9 % — AB (ref 11.5–14.5)
WBC: 11.5 10*3/uL — AB (ref 3.6–11.0)

## 2017-07-05 LAB — URINALYSIS, COMPLETE (UACMP) WITH MICROSCOPIC
Bilirubin Urine: NEGATIVE
Glucose, UA: NEGATIVE mg/dL
Hgb urine dipstick: NEGATIVE
KETONES UR: NEGATIVE mg/dL
Nitrite: NEGATIVE
PH: 6 (ref 5.0–8.0)
PROTEIN: NEGATIVE mg/dL
Specific Gravity, Urine: 1.015 (ref 1.005–1.030)
WBC, UA: >50 WBC/hpf — ABNORMAL HIGH (ref 0–5)

## 2017-07-05 LAB — COMPREHENSIVE METABOLIC PANEL
ALT: 19 U/L (ref 14–54)
AST: 20 U/L (ref 15–41)
Albumin: 4.2 g/dL (ref 3.5–5.0)
Alkaline Phosphatase: 67 U/L (ref 38–126)
Anion gap: 8 (ref 5–15)
BUN: 37 mg/dL — AB (ref 6–20)
CHLORIDE: 100 mmol/L — AB (ref 101–111)
CO2: 28 mmol/L (ref 22–32)
Calcium: 9.4 mg/dL (ref 8.9–10.3)
Creatinine, Ser: 1.11 mg/dL — ABNORMAL HIGH (ref 0.44–1.00)
GFR calc Af Amer: 51 mL/min — ABNORMAL LOW (ref >60)
GFR calc non Af Amer: 44 mL/min — ABNORMAL LOW (ref >60)
Glucose, Bld: 151 mg/dL — ABNORMAL HIGH (ref 65–99)
POTASSIUM: 4.4 mmol/L (ref 3.5–5.1)
SODIUM: 136 mmol/L (ref 135–145)
Total Bilirubin: 0.6 mg/dL (ref 0.3–1.2)
Total Protein: 7.9 g/dL (ref 6.5–8.1)

## 2017-07-05 LAB — GLUCOSE, CAPILLARY: GLUCOSE-CAPILLARY: 236 mg/dL — AB (ref 65–99)

## 2017-07-05 LAB — LIPASE, BLOOD: Lipase: 45 U/L (ref 11–51)

## 2017-07-05 MED ORDER — DARIFENACIN HYDROBROMIDE ER 7.5 MG PO TB24
7.5000 mg | ORAL_TABLET | Freq: Every day | ORAL | Status: DC
  Administered 2017-07-06 – 2017-07-09 (×4): 7.5 mg via ORAL
  Filled 2017-07-05 (×6): qty 1

## 2017-07-05 MED ORDER — DOCUSATE SODIUM 100 MG PO CAPS
100.0000 mg | ORAL_CAPSULE | Freq: Every day | ORAL | Status: DC
  Administered 2017-07-06 – 2017-07-08 (×3): 100 mg via ORAL
  Filled 2017-07-05 (×3): qty 1

## 2017-07-05 MED ORDER — SODIUM CHLORIDE 0.9 % IV BOLUS
1000.0000 mL | Freq: Once | INTRAVENOUS | Status: AC
  Administered 2017-07-05: 1000 mL via INTRAVENOUS

## 2017-07-05 MED ORDER — INSULIN ASPART 100 UNIT/ML ~~LOC~~ SOLN
0.0000 [IU] | Freq: Three times a day (TID) | SUBCUTANEOUS | Status: DC
  Administered 2017-07-06: 3 [IU] via SUBCUTANEOUS
  Administered 2017-07-06: 13:00:00 8 [IU] via SUBCUTANEOUS
  Administered 2017-07-06: 5 [IU] via SUBCUTANEOUS
  Administered 2017-07-07: 13:00:00 8 [IU] via SUBCUTANEOUS
  Administered 2017-07-07 (×2): 5 [IU] via SUBCUTANEOUS
  Administered 2017-07-08: 3 [IU] via SUBCUTANEOUS
  Administered 2017-07-08 (×2): 8 [IU] via SUBCUTANEOUS
  Administered 2017-07-09: 09:00:00 3 [IU] via SUBCUTANEOUS
  Administered 2017-07-09 (×2): 8 [IU] via SUBCUTANEOUS
  Filled 2017-07-05 (×14): qty 1

## 2017-07-05 MED ORDER — PIPERACILLIN-TAZOBACTAM 3.375 G IVPB 30 MIN
3.3750 g | Freq: Once | INTRAVENOUS | Status: AC
  Administered 2017-07-05: 3.375 g via INTRAVENOUS
  Filled 2017-07-05: qty 50

## 2017-07-05 MED ORDER — SODIUM CHLORIDE 0.9 % IV SOLN
INTRAVENOUS | Status: DC
  Administered 2017-07-05 – 2017-07-06 (×2): via INTRAVENOUS

## 2017-07-05 MED ORDER — POLYETHYLENE GLYCOL 3350 17 G PO PACK
17.0000 g | PACK | Freq: Every day | ORAL | Status: DC | PRN
  Administered 2017-07-09: 10:00:00 17 g via ORAL
  Filled 2017-07-05: qty 1

## 2017-07-05 MED ORDER — OMEGA-3-ACID ETHYL ESTERS 1 G PO CAPS
1.0000 g | ORAL_CAPSULE | Freq: Every day | ORAL | Status: DC
  Administered 2017-07-06 – 2017-07-09 (×4): 1 g via ORAL
  Filled 2017-07-05 (×4): qty 1

## 2017-07-05 MED ORDER — ONDANSETRON HCL 4 MG PO TABS: 4.0000 mg | ORAL_TABLET | Freq: Four times a day (QID) | ORAL | Status: DC | PRN

## 2017-07-05 MED ORDER — NYSTATIN 100000 UNIT/GM EX CREA
1.0000 "application " | TOPICAL_CREAM | Freq: Every day | CUTANEOUS | Status: DC
  Administered 2017-07-06 – 2017-07-09 (×4): 1 via TOPICAL
  Filled 2017-07-05: qty 15

## 2017-07-05 MED ORDER — IPRATROPIUM BROMIDE 0.06 % NA SOLN
1.0000 | Freq: Every day | NASAL | Status: DC
  Administered 2017-07-05 – 2017-07-09 (×5): 1 via NASAL
  Filled 2017-07-05 (×2): qty 15

## 2017-07-05 MED ORDER — MIRABEGRON ER 25 MG PO TB24
25.0000 mg | ORAL_TABLET | Freq: Every day | ORAL | Status: DC
  Administered 2017-07-05 – 2017-07-09 (×5): 25 mg via ORAL
  Filled 2017-07-05 (×6): qty 1

## 2017-07-05 MED ORDER — ACETAMINOPHEN 650 MG RE SUPP: 650.0000 mg | Freq: Four times a day (QID) | RECTAL | Status: DC | PRN

## 2017-07-05 MED ORDER — LINAGLIPTIN 5 MG PO TABS
5.0000 mg | ORAL_TABLET | Freq: Every day | ORAL | Status: DC
Start: 2017-07-05 — End: 2017-07-09
  Administered 2017-07-06 – 2017-07-09 (×4): 5 mg via ORAL
  Filled 2017-07-05 (×5): qty 1

## 2017-07-05 MED ORDER — TRAMADOL HCL 50 MG PO TABS
50.0000 mg | ORAL_TABLET | Freq: Two times a day (BID) | ORAL | Status: DC
  Administered 2017-07-05 – 2017-07-09 (×8): 50 mg via ORAL
  Filled 2017-07-05 (×8): qty 1

## 2017-07-05 MED ORDER — ROSUVASTATIN CALCIUM 20 MG PO TABS
40.0000 mg | ORAL_TABLET | Freq: Every day | ORAL | Status: DC
  Administered 2017-07-05 – 2017-07-09 (×5): 40 mg via ORAL
  Filled 2017-07-05 (×5): qty 2

## 2017-07-05 MED ORDER — ENOXAPARIN SODIUM 40 MG/0.4ML ~~LOC~~ SOLN
40.0000 mg | SUBCUTANEOUS | Status: DC
  Administered 2017-07-05 – 2017-07-08 (×4): 40 mg via SUBCUTANEOUS
  Filled 2017-07-05 (×4): qty 0.4

## 2017-07-05 MED ORDER — GABAPENTIN 300 MG PO CAPS
300.0000 mg | ORAL_CAPSULE | Freq: Three times a day (TID) | ORAL | Status: DC
  Administered 2017-07-05 – 2017-07-09 (×12): 300 mg via ORAL
  Filled 2017-07-05 (×12): qty 1

## 2017-07-05 MED ORDER — ONDANSETRON HCL 4 MG/2ML IJ SOLN: 4.0000 mg | Freq: Four times a day (QID) | INTRAMUSCULAR | Status: DC | PRN

## 2017-07-05 MED ORDER — ALPRAZOLAM 1 MG PO TABS
1.0000 mg | ORAL_TABLET | Freq: Every evening | ORAL | Status: DC | PRN
  Administered 2017-07-08: 1 mg via ORAL
  Filled 2017-07-05: qty 1

## 2017-07-05 MED ORDER — ACETAMINOPHEN 325 MG PO TABS
650.0000 mg | ORAL_TABLET | Freq: Four times a day (QID) | ORAL | Status: DC | PRN
  Administered 2017-07-06: 650 mg via ORAL
  Filled 2017-07-05: qty 2

## 2017-07-05 MED ORDER — METOPROLOL TARTRATE 25 MG PO TABS
25.0000 mg | ORAL_TABLET | Freq: Two times a day (BID) | ORAL | Status: DC
  Administered 2017-07-05 – 2017-07-09 (×8): 25 mg via ORAL
  Filled 2017-07-05 (×8): qty 1

## 2017-07-05 MED ORDER — NITROGLYCERIN 0.4 MG SL SUBL: 0.4000 mg | SUBLINGUAL_TABLET | SUBLINGUAL | Status: DC | PRN

## 2017-07-05 MED ORDER — PAROXETINE HCL 10 MG PO TABS
10.0000 mg | ORAL_TABLET | Freq: Every day | ORAL | Status: DC
  Administered 2017-07-06 – 2017-07-09 (×4): 10 mg via ORAL
  Filled 2017-07-05 (×5): qty 1

## 2017-07-05 MED ORDER — LORATADINE 10 MG PO TABS
10.0000 mg | ORAL_TABLET | Freq: Every day | ORAL | Status: DC
  Administered 2017-07-05 – 2017-07-09 (×5): 10 mg via ORAL
  Filled 2017-07-05 (×5): qty 1

## 2017-07-05 MED ORDER — ROPINIROLE HCL 1 MG PO TABS
0.5000 mg | ORAL_TABLET | Freq: Every day | ORAL | Status: DC
  Administered 2017-07-05 – 2017-07-08 (×4): 0.5 mg via ORAL
  Filled 2017-07-05 (×4): qty 1

## 2017-07-05 MED ORDER — MAGNESIUM OXIDE 400 (241.3 MG) MG PO TABS
800.0000 mg | ORAL_TABLET | Freq: Every day | ORAL | Status: DC
  Administered 2017-07-05 – 2017-07-08 (×4): 800 mg via ORAL
  Filled 2017-07-05 (×4): qty 2

## 2017-07-05 MED ORDER — LATANOPROST 0.005 % OP SOLN
1.0000 [drp] | Freq: Every day | OPHTHALMIC | Status: DC
  Administered 2017-07-05 – 2017-07-08 (×4): 1 [drp] via OPHTHALMIC
  Filled 2017-07-05: qty 2.5

## 2017-07-05 MED ORDER — PANTOPRAZOLE SODIUM 40 MG PO TBEC
40.0000 mg | DELAYED_RELEASE_TABLET | Freq: Every day | ORAL | Status: DC
  Administered 2017-07-05 – 2017-07-09 (×5): 40 mg via ORAL
  Filled 2017-07-05 (×5): qty 1

## 2017-07-05 MED ORDER — PIPERACILLIN-TAZOBACTAM 3.375 G IVPB
3.3750 g | Freq: Three times a day (TID) | INTRAVENOUS | Status: DC
  Administered 2017-07-05 – 2017-07-06 (×2): 3.375 g via INTRAVENOUS
  Filled 2017-07-05 (×2): qty 50

## 2017-07-05 MED ORDER — INSULIN ASPART 100 UNIT/ML ~~LOC~~ SOLN
0.0000 [IU] | Freq: Every day | SUBCUTANEOUS | Status: DC
  Administered 2017-07-05 – 2017-07-06 (×2): 2 [IU] via SUBCUTANEOUS
  Administered 2017-07-07 – 2017-07-08 (×2): 3 [IU] via SUBCUTANEOUS
  Filled 2017-07-05 (×4): qty 1

## 2017-07-05 NOTE — Telephone Encounter (Signed)
Spoke to patients daughter and she states she will take the patient to [facility]. Spoke to charge nurse to notify that we are sending patient  By private vehicle and informed her that the culture results are in a phone note. Offered to fax results but she was unable to give me the fax number.

## 2017-07-05 NOTE — Progress Notes (Signed)
Advanced care plan.  Purpose of the Encounter: CODE STATUS Parties in Attendance:Patient and family Patient's Decision Capacity: Good Subjective/Patient's story: Presented with weakness and chills Objective/Medical story Has klebsiella uti Needs IV abx Goals of care determination:  Advance directives discussed Patient does not want any cardiac resuscitation, intubation and ventilator if the need arises Goals met CODE STATUS: DNR Time spent discussing advanced care planning: 16 minutes

## 2017-07-05 NOTE — ED Notes (Signed)
Culture results were scanned in.  Susceptible to amikacin only per result.

## 2017-07-05 NOTE — ED Notes (Signed)
Patient transported to 128 

## 2017-07-05 NOTE — ED Notes (Signed)
Up to bathroom with assistance  States she usually uses a walker   Awaiting on room

## 2017-07-05 NOTE — ED Triage Notes (Signed)
Pt here for possible UTI , home health RN obtained Urine Sample 5 days ago. Pt sent here for IV ABX

## 2017-07-05 NOTE — ED Triage Notes (Signed)
FIRST NURSE NOTE-sent for IV abx.  Has UTI and culture came back and only resistant to one IV only abx per family. NAD. Unlabored. Alert

## 2017-07-05 NOTE — ED Notes (Signed)
See triage note  Was told that she had a UTI and needed iv antibiotics   Low grade fever today  Family at bedside

## 2017-07-05 NOTE — Progress Notes (Signed)
Pharmacy Antibiotic Note  Abigail Boyd is a 84 y.o. female admitted on 07/05/2017 with UTI.  Pharmacy has been consulted for Zosyn dosing.  Plan: Zosyn 3.375g IV q8h (4 hour infusion).  Height: 5\' 5"  (165.1 cm) Weight: 185 lb (83.9 kg) IBW/kg (Calculated) : 57  Temp (24hrs), Avg:98.8 F (37.1 C), Min:98 F (36.7 C), Max:99.5 F (37.5 C)  Recent Labs  Lab 07/05/17 1229  WBC 11.5*  CREATININE 1.11*    Estimated Creatinine Clearance: 39.7 mL/min (A) (by C-G formula based on SCr of 1.11 mg/dL (H)).    Allergies  Allergen Reactions  . Amlodipine Cough  . Clopidogrel Bisulfate Cough  . Kenalog [Triamcinolone Acetonide]     unknown  . Lisinopril Cough  . Losartan Cough  . Sulfa Antibiotics Hives and Rash     Thank you for allowing pharmacy to be a part of this patient's care.  Paulina Fusi, PharmD, BCPS 07/05/2017 8:00 PM

## 2017-07-05 NOTE — ED Provider Notes (Signed)
[facility] Emergency Department Provider Note  ____________________________________________  Time seen: Approximately 4:31 PM  I have reviewed the triage vital signs and the nursing notes.   HISTORY  Chief Complaint Weakness    HPI Abigail Boyd is a 84 y.o. female who presents the emergency department complaining of dysuria, polyuria, weakness, urinary retention feeling.  Patient was sent to the emergency department at recommendation of primary care for IV antibiotics for UTI.  Patient has had multiple UTIs, almost once a month.  Patient developed dysuria and increasing weakness over the past 4 days.  Patient had a urinalysis performed by her primary care with culture ordered.  This returns with signs of infection with urine culture only susceptible to significant IV antibiotics such as Zosyn, ertapenem, meropenem, amikacin.  Patient had a UTI 4 weeks ago that was placed on Augmentin.  Symptoms had improved for approximately 3 weeks until they have had returned at this time.  At that time, culture revealed that E. coli was pansensitive/susceptible to common antibiotics.  Patient currently denies any fevers or chills, chest pain, shortness of breath, abdominal pain, nausea or vomiting.  No diarrhea or constipation.  Positive for weakness, dysuria, retained urinary feeling.  Patient reports that after urination she continues to have a feeling of urine in her bladder/pressure to urinate.  Patient has a significant medical history of anxiety, asthma, CAD, CKD, diabetes, GERD, CVA, recurrent UTI,.  Patient's daughter is present with the patient and manages most of patient's medical condition.   Culture from PCP  Bacteria: Klebsiella pneumoniae ESBL >100000 CFU/ML  Amikacin                                             S Amoxicillin +K Clavulanate                 I Ampicillin                                             R Ampicillin/Sulbactam                            R Aztreonam                                           R Cefazolin                                             R           Cefepime                                             R           Cefotaxime  R Ceftriaxone                                          R Cefuroxime                                          R Ciprofloxacin                                       I Ertapenem                                          S Gentamicin                                          R Meropenem                                         S Nitrofurantoin                                      I Piperacillin/Tazobactam                     S Tetracyclin                                           R Tobramycin                                         R Trimethoprim/Sulfamet Hoxazole       R     Past Medical History:  Diagnosis Date  . Abnormal nuclear stress test 06/21/2011   Inferolateral reversible defect;--> cardiac cath & PCI of CxOM, occluded RCA with collaterals;; followup Myoview 11/2012: Low risk. Fixed basal inferior artifact normal EF. No ischemia  . Anxiety    When necessary Xanax  . Anxiety   . Asthma   . Bilateral cataracts    Status post stroke or correction  . CAD S/P percutaneous coronary angioplasty 06/2011   s/P PCI to proximal OM1 w/ DES; occluded RCA with bridging and L-R collaterals (medical management)  . Chronic kidney disease (CKD), stage II (mild)    Related to current bladder infections (although diabetes cannot be excluded)  . Chronotropic incompetence with sinus node dysfunction [facility]) October 2013   On CPET test; beta blockers reduced  . Diabetes mellitus type 2, controlled ([place])    On oral medications  . Diverticulitis   . Essential hypertension    Allowing for permissive hypertension to avoid orthostatic hypotension  . GERD (gastroesophageal reflux disease)    On PPI  . Glaucoma   .  History of syncope    Per EP - neurocardiogenic &  not Bradycardia related (no PPM)  . History of unstable angina 06/13/2011   Jaw pain awakening from sleep -- Myoview --CATH --> PCI  . Hyperlipidemia with target LDL less than 70    HDL at goal, LDL not at goal, borderline triglycerides. On Crestor 20 mg  . Migraines   . OSA (obstructive sleep apnea)    hx bladder infections  . Osteoarthritis   . PAF (paroxysmal atrial fibrillation) ([place]) 10-11/ 2014   CardioNet Event Monitor: NSR & S Brady -- Rates 50-100; Total A. fib burden 35 hours and 27 minutes. 1296 episodes, longest was 1 hour 29 minutes. Rate ranged from 52-169 beats per minute.  . Seasonal allergies   . Shortness of breath on exertion October 2013   2-D echo: Normal EF>55%, Gr 1DD, mild aortic sclerosis; Evaluated with CPET - peak VO2 97%; Chronotropic Incompetence (submaximal effort)  . Spinal stenosis of lumbar region 11/2011  . Stroke ([place])   . Tachycardia-bradycardia syndrome ([place]) 11/2012  . Urge incontinence     Patient Active Problem List   Diagnosis Date Noted  . UTI (urinary tract infection) 07/05/2017  . Acute lower UTI 04/23/2017  . Generalized weakness 04/23/2017  . Recurrent UTI 04/23/2017  . Dysuria 12/12/2016  . Atypical chest pain 12/12/2016  . Anxiety 12/13/2015  . Allergic rhinitis 12/13/2015  . Shortness of breath 11/18/2015  . Orthostatic hypotension 05/30/2015  . History of cardioembolic cerebrovascular accident (CVA) 05/24/2015  . Syncope 04/23/2015  . Urge incontinence 03/15/2015  . Bilateral lower extremity edema 03/15/2015  . Constipation 03/15/2015  . Right knee pain 03/15/2015  . Hematochezia 03/15/2015  . CKD (chronic kidney disease), stage II   . Osteoporosis screening 06/10/2014  . PLMD (periodic limb movement disorder) 06/12/2013  . Obstructive sleep apnea 03/05/2013  . Tachy-brady syndrome: Rates range from 50-169 bpm in A. fib 12/20/2012  . History of syncope: Unclear etiology 12/19/2012  . Chronotropic incompetence - partially  medication related 08/18/2012  . PAF (paroxysmal atrial fibrillation) (HCC);  CHA2DS2-VASc Score =6 on 15 mg Xarelto 05/16/2012    Class: Diagnosis of  . Long term current use of anticoagulant therapy 05/16/2012  . CAD-PCI OM1, 100% RCA (L-R colllaterals) 07/07/2011  . Hypercholesteremia     Class: Diagnosis of  . Essential hypertension     Class: Diagnosis of  . Spinal stenosis     Class: History of  . Diabetes mellitus (HCC)     Class: Diagnosis of  . Unstable angina - history of; resolved, status post PCI of the OM 06/13/2011    Class: History of  . Benign endometrial hyperplasia 03/21/2011  . Diverticulitis 03/21/2011  . GERD (gastroesophageal reflux disease) 03/21/2011  . Glaucoma (increased eye pressure) 03/21/2011  . Goiter 03/21/2011  . Hemorrhoid 03/21/2011  . History of anemia 03/21/2011  . History of carpal tunnel syndrome 03/21/2011  . Osteopenia 03/21/2011  . Vitamin D deficiency disease 03/21/2011    Past Surgical History:  Procedure Laterality Date  . APPENDECTOMY    . BREAST BIOPSY     both breast  . cataracts     both eyes  . COLONOSCOPY    . CORONARY ANGIOPLASTY WITH STENT PLACEMENT  07/05/2011   Promus Element DES 2.5 mm x 16 mm-post dilated to 2.65 mm CX-Proximal OM1  . Holter Monitor  04/2015   Sinus rhythm with rates 52-149 BPM. Isolated PACs with rare couplets and bigeminy. Multiple short runs  of PAT/PSVT. Arrhythmia run 120 bpm for 204 beats. 14% of time was in A. fib/flutter. - Reviewed by Dr. Caryl Comes. Not thought to be significant enough for her syncope.  Marland Kitchen KNEE ARTHROSCOPY WITH MEDIAL MENISECTOMY Right 06/24/2014   Procedure: RIGHT KNEE ARTHROSCOPY WITH partial lateral MENISECTOMY, abrasion chondroplasty of medial femoral condyle and patella, microfracture technique;  Surgeon: Latanya Maudlin, MD;  Location: [facility] ORS;  Service: Orthopedics;  Laterality: Right;  . LEFT HEART CATHETERIZATION WITH CORONARY ANGIOGRAM N/A 07/06/2011   Procedure: LEFT HEART  CATHETERIZATION WITH CORONARY ANGIOGRAM;  Surgeon: Leonie Man, MD;  Location: [facility] CATH LAB: Unstable Angina --> Myoview wiht Inf-Lat Ischemia.  Proximal OM1 lesion --> PCI; 100% mid RCA with right to right and left to right bridging collaterals from circumflex RPL and LAD the PDA.  Marland Kitchen NM MYOVIEW LTD  October 2014    Low risk. Fixed basal inferior artifact normal EF. No ischemia --> (as compared to pre-PCI Myoview revealing inferolateral ischemia)  . rotator cuff Right   . TRANSTHORACIC ECHOCARDIOGRAM  11/2014   EF 65-70%, Mod LVH. Normal wall motion. Gr 1 DD. Normal valves.  Marland Kitchen VAGINAL HYSTERECTOMY      Prior to Admission medications   Medication Sig Start Date End Date Taking? Authorizing Provider  acetaminophen (TYLENOL) 650 MG CR tablet Take 1,300 mg by mouth 2 (two) times daily as needed for pain.    [provider]  ALPRAZolam Duanne Moron) 0.25 MG tablet TAKE 1 TABLET BY MOUTH AT BEDTIME AS NEEDED SLEEP 04/25/17   Gouru, Illene Silver, MD  conjugated estrogens (PREMARIN) vaginal cream Place 1 Applicatorful vaginally daily. Apply 0.5mg  (pea-sized amount)  just inside the vaginal introitus with a finger-tip on  Monday, Wednesday and Friday nights. 06/04/17   Zara Council A, PA-C  docusate sodium (STOOL SOFTENER) 100 MG capsule Take 100 mg by mouth at bedtime.     [provider]  estradiol (ESTRACE VAGINAL) 0.1 MG/GM vaginal cream Apply 0.5mg  (pea-sized amount)  just inside the vaginal introitus with a finger-tip on Monday, Wednesday and Friday nights. 06/04/17   Zara Council A, PA-C  fish oil-omega-3 fatty acids 1000 MG capsule Take 1 g by mouth daily.     [provider]  furosemide (LASIX) 20 MG tablet Take 2 tablets (40 mg total) by mouth daily. 04/09/17   Leonie Man, MD  gabapentin (NEURONTIN) 300 MG capsule Take 300 mg by mouth 3 (three) times daily. gabapentin 300 mg capsule  take one cap po X 1 week, then take one cap po bid X 1 week, then take one cap po tid and  continue    [provider]  insulin aspart (NOVOLOG) 100 UNIT/ML injection Inject 0-9 Units into the skin 3 (three) times daily with meals. CBG < 70: implement hypoglycemia protocol CBG 70 - 120: 0 units CBG 121 - 150: 1 unit CBG 151 - 200: 2 units CBG 201 - 250: 3 units CBG 251 - 300: 5 units CBG 301 - 350: 7 units CBG 351 - 400: 9 units CBG > 400: call MD and obtain STAT lab verification 04/25/17   Gouru, Illene Silver, MD  Insulin Glargine (LANTUS) 100 UNIT/ML Solostar Pen Inject 30 Units into the skin 2 (two) times daily. 04/25/17   Gouru, Illene Silver, MD  ipratropium (ATROVENT) 0.06 % nasal spray Place 1 spray into the nose daily.  12/08/10   [provider]  loratadine (CLARITIN) 10 MG tablet Take 10 mg by mouth daily.    [provider]  LUMIGAN 0.01 % SOLN Place 1 drop into both eyes at bedtime.  03/06/15   [provider]  Magnesium 500 MG CAPS Take 1,000 mg by mouth at bedtime.     [provider]  metoprolol tartrate (LOPRESSOR) 25 MG tablet TAKE 1 TABLET BY MOUTH  TWICE DAILY. HOLD IF  SYSTOLIC BP IS UNDER 785  AND HEART RATE UNDER 60 05/28/17   Leonie Man, MD  mirabegron ER (MYRBETRIQ) 50 MG TB24 tablet Take one tablet by mouth once daily for kidneys    [provider]  nitroGLYCERIN (NITROSTAT) 0.4 MG SL tablet Place 0.4 mg under the tongue every 5 (five) minutes as needed for chest pain.    [provider]  nystatin cream (MYCOSTATIN) Apply 1 application topically daily. 12/12/16   Leone Haven, MD  ONE TOUCH ULTRA TEST test strip 1 each by Other route as needed.  03/18/15   [provider]  pantoprazole (PROTONIX) 40 MG tablet Take 1 tablet (40 mg total) by mouth daily. 04/05/17   Leone Haven, MD  PARoxetine (PAXIL) 10 MG tablet TAKE 1 TABLET BY MOUTH  DAILY 04/06/17   Leone Haven, MD  polyethylene glycol [facility] / GLYCOLAX) packet Take 17 g by mouth daily as needed for moderate constipation. 04/25/17    Nicholes Mango, MD  rOPINIRole (REQUIP) 0.5 MG tablet TAKE 1 TABLET BY MOUTH AT  BEDTIME 05/30/17   Leone Haven, MD  rosuvastatin (CRESTOR) 40 MG tablet TAKE 1 TABLET BY MOUTH  DAILY 05/28/17   Leonie Man, MD  sitaGLIPtin (JANUVIA) 50 MG tablet Take 50 mg by mouth at bedtime.     [provider]  traMADol (ULTRAM) 50 MG tablet Take 1 tablet (50 mg total) by mouth 2 (two) times daily. 04/25/17   Gouru, Illene Silver, MD  VESICARE 5 MG tablet TAKE 1 TABLET (5 MG TOTAL) BY MOUTH ONCE DAILY. 03/30/15   [provider]    Allergies Amlodipine; Clopidogrel bisulfate; Kenalog [triamcinolone acetonide]; Lisinopril; Losartan; and Sulfa antibiotics  Family History  Problem Relation Age of Onset  . COPD Mother   . Pulmonary fibrosis Mother   . Heart attack Father   . Heart disease Father   . Stroke Father   . Cancer Sister   . Cancer Sister   . Clotting disorder Sister   . Heart attack Sister   . Arthritis Unknown   . Breast cancer Sister   . Colon cancer Neg Hx   . Esophageal cancer Neg Hx   . Stomach cancer Neg Hx     Social History Social History   Tobacco Use  . Smoking status: Never Smoker  . Smokeless tobacco: Never Used  Substance Use Topics  . Alcohol use: No  . Drug use: No     Review of Systems  Constitutional: No fever/chills.  Positive for weakness. Eyes: No visual changes. No discharge ENT: No upper respiratory complaints. Cardiovascular: no chest pain. Respiratory: no cough. No SOB. Gastrointestinal: No abdominal pain.  No nausea, no vomiting.  No diarrhea.  No constipation. Genitourinary: Positive for dysuria.  Positive for polyuria.  No hematuria Musculoskeletal: Negative for musculoskeletal pain. Skin: Negative for rash, abrasions, lacerations, ecchymosis. Neurological: Negative for headaches, focal weakness or numbness. 10-point ROS otherwise negative.  ____________________________________________   PHYSICAL EXAM:  VITAL SIGNS: ED  Triage Vitals [07/05/17 1219]  Enc Vitals Group     BP (!) 146/57     Pulse Rate 72  Resp 18     Temp 99.5 F (37.5 C)     Temp Source Oral     SpO2 95 %     Weight 185 lb (83.9 kg)     Height 5\' 5"  (1.651 m)     Head Circumference      Peak Flow      Pain Score      Pain Loc      Pain Edu?      Excl. in [place]?      Constitutional: Alert and oriented. Well appearing and in no acute distress. Eyes: Conjunctivae are normal. PERRL. EOMI. Head: Atraumatic. Neck: No stridor.    Cardiovascular: Normal rate, regular rhythm. Normal S1 and S2.  Good peripheral circulation. Respiratory: Normal respiratory effort without tachypnea or retractions. Lungs CTAB. Good air entry to the bases with no decreased or absent breath sounds. Gastrointestinal: Bowel sounds 4 quadrants. Soft and nontender to palpation. No guarding or rigidity. No palpable masses. No distention. No CVA tenderness. Musculoskeletal: Full range of motion to all extremities. No gross deformities appreciated. Neurologic:  Normal speech and language. No gross focal neurologic deficits are appreciated.  Skin:  Skin is warm, dry and intact. No rash noted. Psychiatric: Mood and affect are normal. Speech and behavior are normal. Patient exhibits appropriate insight and judgement.   ____________________________________________   LABS (all labs ordered are listed, but only abnormal results are displayed)  Labs Reviewed  COMPREHENSIVE METABOLIC PANEL - Abnormal; Notable for the following components:      Result Value   Chloride 100 (*)    Glucose, Bld 151 (*)    BUN 37 (*)    Creatinine, Ser 1.11 (*)    GFR calc non Af Amer 44 (*)    GFR calc Af Amer 51 (*)    All other components within normal limits  CBC - Abnormal; Notable for the following components:   WBC 11.5 (*)    RDW 14.9 (*)    All other components within normal limits  URINALYSIS, COMPLETE (UACMP) WITH MICROSCOPIC - Abnormal; Notable for the following  components:   Color, Urine YELLOW (*)    APPearance CLOUDY (*)    Leukocytes, UA LARGE (*)    WBC, UA >50 (*)    Bacteria, UA MANY (*)    All other components within normal limits  URINE CULTURE  LIPASE, BLOOD   ____________________________________________  EKG   ____________________________________________  RADIOLOGY   No results found.  ____________________________________________    PROCEDURES  Procedure(s) performed:    Procedures    Medications  sodium chloride 0.9 % bolus 1,000 mL (1,000 mLs Intravenous New Bag/Given 07/05/17 1731)  insulin aspart (novoLOG) injection 0-15 Units (has no administration in time range)  insulin aspart (novoLOG) injection 0-5 Units (has no administration in time range)  piperacillin-tazobactam (ZOSYN) IVPB 3.375 g (3.375 g Intravenous New Bag/Given 07/05/17 1731)     ____________________________________________   INITIAL IMPRESSION / ASSESSMENT AND PLAN / ED COURSE  Pertinent labs & imaging results that were available during my care of the patient were reviewed by me and considered in my medical decision making (see chart for details).  Review of the [place] CSRS was performed in accordance of the [place] prior to dispensing any controlled drugs.     Patient's diagnosis is consistent with GI and weakness.  Patient presented to the emergency department at the recommendation of primary care for IV antibiotics for UTI.  Patient routinely has urinary tract infections, sees urology for  same.  Patient developed dysuria and polyuria over the last 4 days.  Urinalysis ordered by primary care with culture reveals that UTI is only susceptible to Zosyn, ertapenem, ertapenem, amikacin.  Exam is reassuring.  Urinalysis reveals findings consistent with UTI as well as results from PCP office.  Patient will be started on Zosyn in the emergency department.  Second urine culture is ordered off of today's urinalysis.  Patient is a good candidate for  admission, talked with Dr. Estanislado Pandy who accepts patient for admission for IV antibiotics.  Patient care will be turned over to hospitalist service at this time.     ____________________________________________  FINAL CLINICAL IMPRESSION(S) / ED DIAGNOSES  Final diagnoses:  Acute cystitis without hematuria  Weakness      NEW MEDICATIONS STARTED DURING THIS VISIT:  ED Discharge Orders    None          This chart was dictated using voice recognition software/Dragon. Despite best efforts to proofread, errors can occur which can change the meaning. Any change was purely unintentional.    Darletta Moll, PA-C 07/05/17 1806    Delman Kitten, MD 07/05/17 [phone]

## 2017-07-05 NOTE — H&P (Signed)
[place] [facility] [place] NAME: Taneal Sonntag    MR#:  [id number]  DATE OF BIRTH:  02-Sep-1929  DATE OF ADMISSION:  07/05/2017  PRIMARY CARE PHYSICIAN: Leone Haven, MD   REQUESTING/REFERRING PHYSICIAN:   CHIEF COMPLAINT:   Chief Complaint  Patient presents with  . Weakness    HISTORY OF PRESENT ILLNESS: Galina Haddox  is a 84 y.o. female with a known history of coronary artery disease, chronic kidney disease stage II, type 2 diabetes mellitus, hypertension, GERD presented to the emergency room for generalized weakness.  She appeared tired and fatigued.  Patient had a urine culture done by primary care physician which grew resistant bug Klebsiella and sensitive to IV antibiotics only.  She was referred to the emergency room.  No history of any syncope.  Has dysuria and low-grade fever and chills.  PAST MEDICAL HISTORY:   Past Medical History:  Diagnosis Date  . Abnormal nuclear stress test 06/21/2011   Inferolateral reversible defect;--> cardiac cath & PCI of CxOM, occluded RCA with collaterals;; followup Myoview 11/2012: Low risk. Fixed basal inferior artifact normal EF. No ischemia  . Anxiety    When necessary Xanax  . Anxiety   . Asthma   . Bilateral cataracts    Status post stroke or correction  . CAD S/P percutaneous coronary angioplasty 06/2011   s/P PCI to proximal OM1 w/ DES; occluded RCA with bridging and L-R collaterals (medical management)  . Chronic kidney disease (CKD), stage II (mild)    Related to current bladder infections (although diabetes cannot be excluded)  . Chronotropic incompetence with sinus node dysfunction [facility]) October 2013   On CPET test; beta blockers reduced  . Diabetes mellitus type 2, controlled ([place])    On oral medications  . Diverticulitis   . Essential hypertension    Allowing for permissive hypertension to avoid orthostatic hypotension  . GERD (gastroesophageal reflux disease)    On PPI  .  Glaucoma   . History of syncope    Per EP - neurocardiogenic & not Bradycardia related (no PPM)  . History of unstable angina 06/13/2011   Jaw pain awakening from sleep -- Myoview --CATH --> PCI  . Hyperlipidemia with target LDL less than 70    HDL at goal, LDL not at goal, borderline triglycerides. On Crestor 20 mg  . Migraines   . OSA (obstructive sleep apnea)    hx bladder infections  . Osteoarthritis   . PAF (paroxysmal atrial fibrillation) ([place]) 10-11/ 2014   CardioNet Event Monitor: NSR & S Brady -- Rates 50-100; Total A. fib burden 35 hours and 27 minutes. 1296 episodes, longest was 1 hour 29 minutes. Rate ranged from 52-169 beats per minute.  . Seasonal allergies   . Shortness of breath on exertion October 2013   2-D echo: Normal EF>55%, Gr 1DD, mild aortic sclerosis; Evaluated with CPET - peak VO2 97%; Chronotropic Incompetence (submaximal effort)  . Spinal stenosis of lumbar region 11/2011  . Stroke ([place])   . Tachycardia-bradycardia syndrome ([place]) 11/2012  . Urge incontinence     PAST SURGICAL HISTORY:  Past Surgical History:  Procedure Laterality Date  . APPENDECTOMY    . BREAST BIOPSY     both breast  . cataracts     both eyes  . COLONOSCOPY    . CORONARY ANGIOPLASTY WITH STENT PLACEMENT  07/05/2011   Promus Element DES 2.5 mm x 16 mm-post dilated to 2.65 mm CX-Proximal OM1  .  Holter Monitor  04/2015   Sinus rhythm with rates 52-149 BPM. Isolated PACs with rare couplets and bigeminy. Multiple short runs of PAT/PSVT. Arrhythmia run 120 bpm for 204 beats. 14% of time was in A. fib/flutter. - Reviewed by Dr. Caryl Comes. Not thought to be significant enough for her syncope.  Marland Kitchen KNEE ARTHROSCOPY WITH MEDIAL MENISECTOMY Right 06/24/2014   Procedure: RIGHT KNEE ARTHROSCOPY WITH partial lateral MENISECTOMY, abrasion chondroplasty of medial femoral condyle and patella, microfracture technique;  Surgeon: Latanya Maudlin, MD;  Location: [facility];  Service: Orthopedics;  Laterality: Right;   . LEFT HEART CATHETERIZATION WITH CORONARY ANGIOGRAM N/A 07/06/2011   Procedure: LEFT HEART CATHETERIZATION WITH CORONARY ANGIOGRAM;  Surgeon: Leonie Man, MD;  Location: [facility] CATH LAB: Unstable Angina --> Myoview wiht Inf-Lat Ischemia.  Proximal OM1 lesion --> PCI; 100% mid RCA with right to right and left to right bridging collaterals from circumflex RPL and LAD the PDA.  Marland Kitchen NM MYOVIEW LTD  October 2014    Low risk. Fixed basal inferior artifact normal EF. No ischemia --> (as compared to pre-PCI Myoview revealing inferolateral ischemia)  . rotator cuff Right   . TRANSTHORACIC ECHOCARDIOGRAM  11/2014   EF 65-70%, Mod LVH. Normal wall motion. Gr 1 DD. Normal valves.  Marland Kitchen VAGINAL HYSTERECTOMY      SOCIAL HISTORY:  Social History   Tobacco Use  . Smoking status: Never Smoker  . Smokeless tobacco: Never Used  Substance Use Topics  . Alcohol use: No    FAMILY HISTORY:  Family History  Problem Relation Age of Onset  . COPD Mother   . Pulmonary fibrosis Mother   . Heart attack Father   . Heart disease Father   . Stroke Father   . Cancer Sister   . Cancer Sister   . Clotting disorder Sister   . Heart attack Sister   . Arthritis Unknown   . Breast cancer Sister   . Colon cancer Neg Hx   . Esophageal cancer Neg Hx   . Stomach cancer Neg Hx     DRUG ALLERGIES:  Allergies  Allergen Reactions  . Amlodipine Cough  . Clopidogrel Bisulfate Cough  . Kenalog [Triamcinolone Acetonide]     unknown  . Lisinopril Cough  . Losartan Cough  . Sulfa Antibiotics Hives and Rash    REVIEW OF SYSTEMS:   CONSTITUTIONAL: Has fever, fatigue or weakness.  EYES: No blurred or double vision.  EARS, NOSE, AND THROAT: No tinnitus or ear pain.  RESPIRATORY: No cough, shortness of breath, wheezing or hemoptysis.  CARDIOVASCULAR: No chest pain, orthopnea, edema.  GASTROINTESTINAL: No nausea, vomiting, diarrhea or abdominal pain.  GENITOURINARY: Has dysuria,no hematuria.  ENDOCRINE: No polyuria,  nocturia,  HEMATOLOGY: No anemia, easy bruising or bleeding SKIN: No rash or lesion. MUSCULOSKELETAL: No joint pain or arthritis.   NEUROLOGIC: No tingling, numbness, weakness.  PSYCHIATRY: No anxiety or depression.   MEDICATIONS AT HOME:  Prior to Admission medications   Medication Sig Start Date End Date Taking? Authorizing Provider  acetaminophen (TYLENOL) 650 MG CR tablet Take 1,300 mg by mouth 2 (two) times daily as needed for pain.    [provider]  ALPRAZolam Duanne Moron) 0.25 MG tablet TAKE 1 TABLET BY MOUTH AT BEDTIME AS NEEDED SLEEP 04/25/17   Gouru, Illene Silver, MD  conjugated estrogens (PREMARIN) vaginal cream Place 1 Applicatorful vaginally daily. Apply 0.5mg  (pea-sized amount)  just inside the vaginal introitus with a finger-tip on  Monday, Wednesday and Friday nights. 06/04/17   Zara Council  A, PA-C  docusate sodium (STOOL SOFTENER) 100 MG capsule Take 100 mg by mouth at bedtime.     [provider]  estradiol (ESTRACE VAGINAL) 0.1 MG/GM vaginal cream Apply 0.5mg  (pea-sized amount)  just inside the vaginal introitus with a finger-tip on Monday, Wednesday and Friday nights. 06/04/17   Zara Council A, PA-C  fish oil-omega-3 fatty acids 1000 MG capsule Take 1 g by mouth daily.     [provider]  furosemide (LASIX) 20 MG tablet Take 2 tablets (40 mg total) by mouth daily. 04/09/17   Leonie Man, MD  gabapentin (NEURONTIN) 300 MG capsule Take 300 mg by mouth 3 (three) times daily. gabapentin 300 mg capsule  take one cap po X 1 week, then take one cap po bid X 1 week, then take one cap po tid and continue    [provider]  insulin aspart (NOVOLOG) 100 UNIT/ML injection Inject 0-9 Units into the skin 3 (three) times daily with meals. CBG < 70: implement hypoglycemia protocol CBG 70 - 120: 0 units CBG 121 - 150: 1 unit CBG 151 - 200: 2 units CBG 201 - 250: 3 units CBG 251 - 300: 5 units CBG 301 - 350: 7 units CBG 351 - 400: 9 units CBG > 400:  call MD and obtain STAT lab verification 04/25/17   Gouru, Illene Silver, MD  Insulin Glargine (LANTUS) 100 UNIT/ML Solostar Pen Inject 30 Units into the skin 2 (two) times daily. 04/25/17   Gouru, Illene Silver, MD  ipratropium (ATROVENT) 0.06 % nasal spray Place 1 spray into the nose daily.  12/08/10   [provider]  loratadine (CLARITIN) 10 MG tablet Take 10 mg by mouth daily.    [provider]  LUMIGAN 0.01 % SOLN Place 1 drop into both eyes at bedtime.  03/06/15   [provider]  Magnesium 500 MG CAPS Take 1,000 mg by mouth at bedtime.     [provider]  metoprolol tartrate (LOPRESSOR) 25 MG tablet TAKE 1 TABLET BY MOUTH  TWICE DAILY. HOLD IF  SYSTOLIC BP IS UNDER 601  AND HEART RATE UNDER 60 05/28/17   Leonie Man, MD  mirabegron ER (MYRBETRIQ) 50 MG TB24 tablet Take one tablet by mouth once daily for kidneys    [provider]  nitroGLYCERIN (NITROSTAT) 0.4 MG SL tablet Place 0.4 mg under the tongue every 5 (five) minutes as needed for chest pain.    [provider]  nystatin cream (MYCOSTATIN) Apply 1 application topically daily. 12/12/16   Leone Haven, MD  ONE TOUCH ULTRA TEST test strip 1 each by Other route as needed.  03/18/15   [provider]  pantoprazole (PROTONIX) 40 MG tablet Take 1 tablet (40 mg total) by mouth daily. 04/05/17   Leone Haven, MD  PARoxetine (PAXIL) 10 MG tablet TAKE 1 TABLET BY MOUTH  DAILY 04/06/17   Leone Haven, MD  polyethylene glycol [facility] / GLYCOLAX) packet Take 17 g by mouth daily as needed for moderate constipation. 04/25/17   Nicholes Mango, MD  rOPINIRole (REQUIP) 0.5 MG tablet TAKE 1 TABLET BY MOUTH AT  BEDTIME 05/30/17   Leone Haven, MD  rosuvastatin (CRESTOR) 40 MG tablet TAKE 1 TABLET BY MOUTH  DAILY 05/28/17   Leonie Man, MD  sitaGLIPtin (JANUVIA) 50 MG tablet Take 50 mg by mouth at bedtime.     [provider]  traMADol (ULTRAM) 50 MG tablet Take 1 tablet (50 mg  total) by mouth 2 (two) times daily. 04/25/17   Gouru, Illene Silver, MD  VESICARE 5 MG tablet TAKE 1 TABLET (5 MG TOTAL) BY MOUTH ONCE DAILY. 03/30/15   [provider]      PHYSICAL EXAMINATION:   VITAL SIGNS: Blood pressure (!) 145/66, pulse 70, temperature 98 F (36.7 C), temperature source Oral, resp. rate 18, height 5\' 5"  (1.651 m), weight 83.9 kg (185 lb), SpO2 98 %.  GENERAL:  84 y.o.-year-old patient lying in the bed with no acute distress.  EYES: Pupils equal, round, reactive to light and accommodation. No scleral icterus. Extraocular muscles intact.  HEENT: Head atraumatic, normocephalic. Oropharynx dry and nasopharynx clear.  NECK:  Supple, no jugular venous distention. No thyroid enlargement, no tenderness.  LUNGS: Normal breath sounds bilaterally, no wheezing, rales,rhonchi or crepitation. No use of accessory muscles of respiration.  CARDIOVASCULAR: S1, S2 normal. No murmurs, rubs, or gallops.  ABDOMEN: Soft, nontender, nondistended. Bowel sounds present. No organomegaly or mass.  EXTREMITIES: No pedal edema, cyanosis, or clubbing.  NEUROLOGIC: Cranial nerves II through XII are intact. Muscle strength 5/5 in all extremities. Sensation intact. Gait not checked.  PSYCHIATRIC: The patient is alert and oriented x 3.  SKIN: No obvious rash, lesion, or ulcer.   LABORATORY PANEL:   CBC Recent Labs  Lab 07/05/17 1229  WBC 11.5*  HGB 12.2  HCT 36.8  PLT 319  MCV 94.4  MCH 31.3  MCHC 33.1  RDW 14.9*   ------------------------------------------------------------------------------------------------------------------  Chemistries  Recent Labs  Lab 07/05/17 1229  NA 136  K 4.4  CL 100*  CO2 28  GLUCOSE 151*  BUN 37*  CREATININE 1.11*  CALCIUM 9.4  AST 20  ALT 19  ALKPHOS 67  BILITOT 0.6   ------------------------------------------------------------------------------------------------------------------ estimated creatinine clearance is 39.7 mL/min (A) (by C-G  formula based on SCr of 1.11 mg/dL (H)). ------------------------------------------------------------------------------------------------------------------ No results for input(s): TSH, T4TOTAL, T3FREE, THYROIDAB in the last 72 hours.  Invalid input(s): FREET3   Coagulation profile No results for input(s): INR, PROTIME in the last 168 hours. ------------------------------------------------------------------------------------------------------------------- No results for input(s): DDIMER in the last 72 hours. -------------------------------------------------------------------------------------------------------------------  Cardiac Enzymes No results for input(s): CKMB, TROPONINI, MYOGLOBIN in the last 168 hours.  Invalid input(s): CK ------------------------------------------------------------------------------------------------------------------ Invalid input(s): POCBNP  ---------------------------------------------------------------------------------------------------------------  Urinalysis    Component Value Date/Time   COLORURINE YELLOW (A) 07/05/2017 1229   APPEARANCEUR CLOUDY (A) 07/05/2017 1229   APPEARANCEUR Cloudy (A) 06/04/2017 1507   LABSPEC 1.015 07/05/2017 1229   PHURINE 6.0 07/05/2017 1229   GLUCOSEU NEGATIVE 07/05/2017 1229   GLUCOSEU NEGATIVE 08/19/2015 1630   HGBUR NEGATIVE 07/05/2017 1229   BILIRUBINUR NEGATIVE 07/05/2017 1229   BILIRUBINUR Negative 06/04/2017 1507   KETONESUR NEGATIVE 07/05/2017 1229   PROTEINUR NEGATIVE 07/05/2017 1229   UROBILINOGEN 0.2 04/12/2017 1355   UROBILINOGEN 0.2 08/19/2015 1630   NITRITE NEGATIVE 07/05/2017 1229   LEUKOCYTESUR LARGE (A) 07/05/2017 1229   LEUKOCYTESUR 3+ (A) 06/04/2017 1507     RADIOLOGY: No results found.  EKG: Orders placed or performed during the hospital encounter of 04/23/17  . ED EKG  . ED EKG  . EKG    IMPRESSION AND PLAN:  Ceci Taliaferro  is a 84 y.o. female with a known history of coronary  artery disease, chronic kidney disease stage II, type 2 diabetes mellitus, hypertension, GERD presented to the emergency room for generalized weakness.   -Klebsiella pneumonia urinary tract infection Start patient on IV Zosyn antibiotic  -Dehydration IV fluid hydration with normal saline  -  Leukocytosis secondary to UTI  follow-up WBC count  -Diabetes mellitus type 2 Sliding scale coverage with insulin  -Chronic kidney disease stage II  monitor renal function   All the records are reviewed and case discussed with ED provider. Management plans discussed with the patient, family and they are in agreement.  CODE STATUS:DNR    Code Status Orders  (From admission, onward)        Start     Ordered   07/05/17 1819  Do not attempt resuscitation (DNR)  Continuous    Question Answer Comment  In the event of cardiac or respiratory ARREST Do not call a "code blue"   In the event of cardiac or respiratory ARREST Do not perform Intubation, CPR, defibrillation or ACLS   In the event of cardiac or respiratory ARREST Use medication by any route, position, wound care, and other measures to relive pain and suffering. May use oxygen, suction and manual treatment of airway obstruction as needed for comfort.      07/05/17 1818    Code Status History    Date Active Date Inactive Code Status Order ID Comments User Context   04/23/2017 2216 04/25/2017 2153 DNR [id number]  [name], MD Inpatient   12/26/2014 1822 12/29/2014 1632 Full Code [id number]  Allie Bossier, MD ED   12/19/2012 2229 12/20/2012 2124 Full Code [id number]  Consuelo Pandy, PA-C Inpatient       TOTAL TIME TAKING CARE OF THIS PATIENT: 52 minutes.    Saundra Shelling M.D on 07/05/2017 at 6:19 PM  Between 7am to 6pm - Pager - [phone]  After 6pm go to [email or web address] - password [email or web address] [facility]  [facility]  Office  [phone]  CC: Primary care physician; Leone Haven, MD

## 2017-07-05 NOTE — Telephone Encounter (Signed)
Received UA and culture results. Given to Dr.Sonnenberg   Bacteria: Klebsiella pneumoniae ESBL >100000 CFU/ML  Amikacin    S Amoxicillin +K Clavulanate  I Ampicillin    R Ampicillin/Sulbactam   R Aztreonam    R Cefazolin    R  Cefepime    R  Cefotaxime    R Ceftriaxone    R Cefuroxime    R Ciprofloxacin    I Ertapenem    S Gentamicin    R Meropenem    S Nitrofurantoin    I Piperacillin/Tazobactam  S Tetracyclin    R Tobramycin    R Trimethoprim/Sulfamet Hoxazole R

## 2017-07-05 NOTE — Telephone Encounter (Signed)
Please contact the patient or the patient's family and let them know that her urine culture grew out a bacteria that is resistant to all of the oral options.  Given this and the fact that she has not been evaluated for this issue she will need to go to the emergency department for evaluation and likely admission to the hospital for antibiotics.  Once you speak with them if she is willing to go please contact the charge nurse and let them know that the culture results are listed in our phone note and if needed we can fax them the culture results.  Thanks.

## 2017-07-06 LAB — GLUCOSE, CAPILLARY
GLUCOSE-CAPILLARY: 321 mg/dL — AB (ref 65–99)
GLUCOSE-CAPILLARY: 280 mg/dL — AB (ref 65–99)
GLUCOSE-CAPILLARY: 249 mg/dL — AB (ref 65–99)
GLUCOSE-CAPILLARY: 237 mg/dL — AB (ref 65–99)
Glucose-Capillary: 182 mg/dL — ABNORMAL HIGH (ref 65–99)
Glucose-Capillary: 205 mg/dL — ABNORMAL HIGH (ref 65–99)
Glucose-Capillary: 200 mg/dL — ABNORMAL HIGH (ref 65–99)

## 2017-07-06 LAB — CBC
HEMATOCRIT: 34.9 % — AB (ref 35.0–47.0)
Hemoglobin: 11.6 g/dL — ABNORMAL LOW (ref 12.0–16.0)
MCH: 31.3 pg (ref 26.0–34.0)
MCHC: 33.2 g/dL (ref 32.0–36.0)
MCV: 94.3 fL (ref 80.0–100.0)
PLATELETS: 296 10*3/uL (ref 150–440)
RBC: 3.7 MIL/uL — ABNORMAL LOW (ref 3.80–5.20)
RDW: 14.6 % — AB (ref 11.5–14.5)
WBC: 11.7 10*3/uL — AB (ref 3.6–11.0)

## 2017-07-06 LAB — BASIC METABOLIC PANEL
Anion gap: 6 (ref 5–15)
BUN: 28 mg/dL — AB (ref 6–20)
CHLORIDE: 102 mmol/L (ref 101–111)
CO2: 28 mmol/L (ref 22–32)
Calcium: 8.9 mg/dL (ref 8.9–10.3)
Creatinine, Ser: 0.95 mg/dL (ref 0.44–1.00)
GFR calc Af Amer: >60 mL/min (ref >60)
GFR calc non Af Amer: 53 mL/min — ABNORMAL LOW (ref >60)
GLUCOSE: 215 mg/dL — AB (ref 65–99)
POTASSIUM: 4.4 mmol/L (ref 3.5–5.1)
Sodium: 136 mmol/L (ref 135–145)

## 2017-07-06 MED ORDER — INSULIN GLARGINE 100 UNIT/ML ~~LOC~~ SOLN
15.0000 [IU] | Freq: Two times a day (BID) | SUBCUTANEOUS | Status: DC
  Administered 2017-07-06 – 2017-07-09 (×6): 15 [IU] via SUBCUTANEOUS
  Filled 2017-07-06 (×9): qty 0.15

## 2017-07-06 MED ORDER — SODIUM CHLORIDE 0.9 % IV SOLN
1.0000 g | INTRAVENOUS | Status: DC
  Administered 2017-07-06 – 2017-07-07 (×2): 1 g via INTRAVENOUS
  Filled 2017-07-06 (×4): qty 1

## 2017-07-06 MED ORDER — KETOROLAC TROMETHAMINE 30 MG/ML IJ SOLN
15.0000 mg | Freq: Once | INTRAMUSCULAR | Status: AC
  Administered 2017-07-06: 15 mg via INTRAVENOUS
  Filled 2017-07-06: qty 1

## 2017-07-06 NOTE — Progress Notes (Signed)
Pharmacy Antibiotic Note  Abigail Boyd is a 84 y.o. female admitted on 07/05/2017 with UTI.  Pharmacy has been consulted for Zosyn dosing.  Plan: Zosyn 3.375g IV q8h (4 hour infusion).  Height: 5\' 4"  (162.6 cm) Weight: 183 lb 4.8 oz (83.1 kg) IBW/kg (Calculated) : 54.7  Temp (24hrs), Avg:98 F (36.7 C), Min:97.7 F (36.5 C), Max:98.3 F (36.8 C)  Recent Labs  Lab 07/05/17 1229 07/06/17 0522  WBC 11.5* 11.7*  CREATININE 1.11* 0.95    Estimated Creatinine Clearance: 45.2 mL/min (by C-G formula based on SCr of 0.95 mg/dL).    Allergies  Allergen Reactions  . Amlodipine Cough  . Clopidogrel Bisulfate Cough  . Kenalog [Triamcinolone Acetonide]     unknown  . Lisinopril Cough  . Losartan Cough  . Sulfa Antibiotics Hives and Rash     Thank you for allowing pharmacy to be a part of this patient's care.  Rayna Sexton, PharmD, BCPS Clinical Pharmacist 07/06/2017 2:39 PM

## 2017-07-06 NOTE — Plan of Care (Signed)
  Problem: Education: Goal: Knowledge of General Education information will improve Outcome: Progressing   Problem: Activity: Goal: Risk for activity intolerance will decrease Outcome: Progressing   Problem: Nutrition: Goal: Adequate nutrition will be maintained Outcome: Progressing   Problem: Elimination: Goal: Will not experience complications related to urinary retention Outcome: Progressing   Problem: Pain Managment: Goal: General experience of comfort will improve Outcome: Progressing   Problem: Safety: Goal: Ability to remain free from injury will improve Outcome: Progressing   Problem: Skin Integrity: Goal: Risk for impaired skin integrity will decrease Outcome: Progressing

## 2017-07-06 NOTE — Progress Notes (Signed)
Patient currently awake, alert and oriented. Patient complains of no pain or dyspnea. Will continue monitor to end of shift.

## 2017-07-06 NOTE — Progress Notes (Signed)
[place] [facility] [place] NAME: Lorilee Cafarella    MR#:  [id number]  DATE OF BIRTH:  [birth year]  SUBJECTIVE:   Pt. Here due to generalized weakness and noted to have UTI. UTI is due to ESBL and sent in for admission for IV abx.  No acute events overnight.   REVIEW OF SYSTEMS:    Review of Systems  Constitutional: Negative for chills and fever.  HENT: Negative for congestion and tinnitus.   Eyes: Negative for blurred vision and double vision.  Respiratory: Negative for cough, shortness of breath and wheezing.   Cardiovascular: Negative for chest pain, orthopnea and PND.  Gastrointestinal: Negative for abdominal pain, diarrhea, nausea and vomiting.  Genitourinary: Negative for dysuria and hematuria.  Neurological: Positive for weakness (Generalized). Negative for dizziness, sensory change and focal weakness.  All other systems reviewed and are negative.   Nutrition: heart healthy/Carb modified Tolerating Diet: Yes Tolerating PT: Await Eval.   DRUG ALLERGIES:   Allergies  Allergen Reactions  . Amlodipine Cough  . Clopidogrel Bisulfate Cough  . Kenalog [Triamcinolone Acetonide]     unknown  . Lisinopril Cough  . Losartan Cough  . Sulfa Antibiotics Hives and Rash    VITALS:  Blood pressure (!) 157/62, pulse 71, temperature 98.3 F (36.8 C), temperature source Oral, resp. rate 20, height 5\' 4"  (1.626 m), weight 83.1 kg (183 lb 4.8 oz), SpO2 99 %.  PHYSICAL EXAMINATION:   Physical Exam  GENERAL:  84 y.o.-year-old patient lying in bed in no acute distress.  EYES: Pupils equal, round, reactive to light and accommodation. No scleral icterus. Extraocular muscles intact.  HEENT: Head atraumatic, normocephalic. Oropharynx and nasopharynx clear.  NECK:  Supple, no jugular venous distention. No thyroid enlargement, no tenderness.  LUNGS: Normal breath sounds bilaterally, no wheezing, rales, rhonchi. No use of accessory muscles of respiration.   CARDIOVASCULAR: S1, S2 normal. No murmurs, rubs, or gallops.  ABDOMEN: Soft, nontender, nondistended. Bowel sounds present. No organomegaly or mass.  EXTREMITIES: No cyanosis, clubbing or edema b/l.    NEUROLOGIC: Cranial nerves II through XII are intact. No focal Motor or sensory deficits b/l. Globally weak.   PSYCHIATRIC: The patient is alert and oriented x 3.  SKIN: No obvious rash, lesion, or ulcer.    LABORATORY PANEL:   CBC Recent Labs  Lab 07/06/17 0522  WBC 11.7*  HGB 11.6*  HCT 34.9*  PLT 296   ------------------------------------------------------------------------------------------------------------------  Chemistries  Recent Labs  Lab 07/05/17 1229 07/06/17 0522  NA 136 136  K 4.4 4.4  CL 100* 102  CO2 28 28  GLUCOSE 151* 215*  BUN 37* 28*  CREATININE 1.11* 0.95  CALCIUM 9.4 8.9  AST 20  --   ALT 19  --   ALKPHOS 67  --   BILITOT 0.6  --    ------------------------------------------------------------------------------------------------------------------  Cardiac Enzymes No results for input(s): TROPONINI in the last 168 hours. ------------------------------------------------------------------------------------------------------------------  RADIOLOGY:  No results found.   ASSESSMENT AND PLAN:   84 year old female with past medical history of diabetes, hypertension, anxiety, incontinence, GERD, anxiety, hyperlipidemia, restless leg syndrome who presented to the hospital due to generalized weakness and noted to have urinary tract infection.  1.  Urinary tract infection-this is secondary to ESBL.  Patient had urine cultures done at her primary care physician's office which was positive for Klebsiella which is ESBL positive. - We will place on IV ertapenem for now. - Follow clinically, repeat cultures have been drawn and  sensitivities are pending.  Currently afebrile and hemodynamically stable.  2.  Generalized weakness-secondary to UTI, continue  antibiotics as mentioned above, await physical therapy evaluation.  3.  DM type II without complication-continue Lantus, NovoLog sliding scale. -Follow blood sugars which are somewhat elevated today.  4.  Anxiety-continue Paxil.  5.  Essential hypertension-continue metoprolol  6.  Hyperlipidemia-continue Crestor.  7.  Restless leg syndrome-continue Requip.     All the records are reviewed and case discussed with Care Management/Social Worker. Management plans discussed with the patient, family and they are in agreement.  CODE STATUS: DNR  DVT Prophylaxis: Lovenox  TOTAL TIME TAKING CARE OF THIS PATIENT: 30 minutes.   POSSIBLE D/C IN 1-2 DAYS, DEPENDING ON CLINICAL CONDITION.   Henreitta Leber M.D on 07/06/2017 at 3:40 PM  Between 7am to 6pm - Pager - [phone]  After 6pm go to [email or web address] - Technical brewer  [facility]ospital[facility]  Office  [phone]  CC: Primary care physician; Leone Haven, MD

## 2017-07-06 NOTE — Evaluation (Signed)
Physical Therapy Evaluation Patient Details Name: Abigail Boyd MRN: [id number] DOB: 12-30-1929 Today's Date: 07/06/2017   History of Present Illness  presented to ER secondary to progressive weakness; admitted for mangement of UTI.  Clinical Impression  Upon evaluation, patient alert and oriented; follows all commands and demonstrates good effort with all functional tasks.  Bilat UE/LE strength and ROM grossly WFL, except R knee guarded/painful due to chronic arthritic changes.  Able to complete bed mobility with min/mod assist; sit/stand, basic transfers and gait (15') with RW, cga.  Very slow and guarded, with limited ability to respond to external perturbation, but no overt buckling or LOB if allowed to move at her own pace. Would benefit from skilled PT to address above deficits and promote optimal return to PLOF; Recommend transition to [place] upon discharge from acute hospitalization.     Follow Up Recommendations Home health PT    Equipment Recommendations  (has RW)    Recommendations for Other Services       Precautions / Restrictions Precautions Precautions: Fall Restrictions Weight Bearing Restrictions: No      Mobility  Bed Mobility Overal bed mobility: Needs Assistance Bed Mobility: Supine to Sit     Supine to sit: Min assist;Mod assist     General bed mobility comments: assist for LE management and truncal elevation  Transfers Overall transfer level: Needs assistance Equipment used: Rolling walker (2 wheeled) Transfers: Sit to/from Stand Sit to Stand: Min guard            Ambulation/Gait Ambulation/Gait assistance: Min guard Ambulation Distance (Feet): 15 Feet Assistive device: Rolling walker (2 wheeled)       General Gait Details: forward flexed posture, very short, shuffling steps; slow, guarded stepping pattern and mobiltiy progression.  Poor balance reactions, but no overt buckling or LOB.  Additional distance limited by pain.  Stairs             Wheelchair Mobility    Modified Rankin (Stroke Patients Only)       Balance Overall balance assessment: Needs assistance Sitting-balance support: No upper extremity supported;Feet supported Sitting balance-Leahy Scale: Good     Standing balance support: Bilateral upper extremity supported Standing balance-Leahy Scale: Fair                               Pertinent Vitals/Pain Pain Assessment: 0-10 Pain Score: 7  Pain Location: R knee Pain Descriptors / Indicators: Aching;Grimacing Pain Intervention(s): Limited activity within patient's tolerance;Monitored during session;Patient requesting pain meds-RN notified;RN gave pain meds during session    Home Living   Living Arrangements: Children               Additional Comments: Lives with daughter.  Has personal care attendant 11am-5pm each day; additional aide 2x/week for assist with ADLs.  Daughter goes to work during the day.  Family/caregiver available 24/7 per patient rpeort    Prior Function Level of Independence: Needs assistance      ADL's / Homemaking Assistance Needed: Pt sleeps in lift chair.  Her caregiver assists with sponge baths and dressing.  Family and caregiver do the cooking, cleaning.    Comments: Sup for basic transfers and household distances with RW; community mobilization for hair and MD appointments only (tends to utilize [facility] for longer distances).  Denies fall history.     Hand Dominance        Extremity/Trunk Assessment   Upper Extremity Assessment Upper Extremity Assessment:  Overall [facility] for tasks assessed    Lower Extremity Assessment Lower Extremity Assessment: Overall WFL for tasks assessed(grossly at least 4-/5; R knee very guarded due to pain (chronic arthritic pain per patient))       Communication   Communication: No difficulties  Cognition Arousal/Alertness: Awake/alert Behavior During Therapy: WFL for tasks assessed/performed Overall Cognitive Status:  Within Functional Limits for tasks assessed                                        General Comments      Exercises     Assessment/Plan    PT Assessment Patient needs continued PT services  PT Problem List Decreased strength;Decreased range of motion;Decreased activity tolerance;Decreased knowledge of use of DME;Decreased balance;Decreased mobility;Decreased knowledge of precautions;Decreased safety awareness       PT Treatment Interventions DME instruction;Gait training;Stair training;Functional mobility training;Therapeutic activities;Therapeutic exercise;Balance training;Patient/family education    PT Goals (Current goals can be found in the Care Plan section)  Acute Rehab PT Goals Patient Stated Goal: to return home PT Goal Formulation: With patient Time For Goal Achievement: 07/20/17 Potential to Achieve Goals: Good    Frequency Min 2X/week   Barriers to discharge        Co-evaluation               AM-PAC PT "6 Clicks" Daily Activity  Outcome Measure Difficulty turning over in bed (including adjusting bedclothes, sheets and blankets)?: Unable Difficulty moving from lying on back to sitting on the side of the bed? : Unable Difficulty sitting down on and standing up from a chair with arms (e.g., wheelchair, bedside commode, etc,.)?: Unable Help needed moving to and from a bed to chair (including a wheelchair)?: A Little Help needed walking in hospital room?: A Little Help needed climbing 3-5 steps with a railing? : A Little 6 Click Score: 12    End of Session Equipment Utilized During Treatment: Gait belt Activity Tolerance: Patient tolerated treatment well Patient left: in chair;with call bell/phone within reach;with chair alarm set Nurse Communication: Mobility status PT Visit Diagnosis: Difficulty in walking, not elsewhere classified (R26.2);Muscle weakness (generalized) (M62.81);Pain Pain - Right/Left: Right Pain - part of body: Knee     Time: 3086-5784 PT Time Calculation (min) (ACUTE ONLY): 32 min   Charges:   PT Evaluation $PT Eval Moderate Complexity: 1 Mod PT Treatments $Therapeutic Activity: 8-22 mins   PT G Codes:        Prophet Renwick H. Owens Shark, PT, DPT, NCS 07/06/17, 4:59 PM [phone]

## 2017-07-07 ENCOUNTER — Inpatient Hospital Stay: Payer: Self-pay

## 2017-07-07 LAB — CBC
HCT: 35.4 % (ref 35.0–47.0)
HEMOGLOBIN: 12.1 g/dL (ref 12.0–16.0)
MCH: 32 pg (ref 26.0–34.0)
MCHC: 34.1 g/dL (ref 32.0–36.0)
MCV: 93.8 fL (ref 80.0–100.0)
Platelets: 277 10*3/uL (ref 150–440)
RBC: 3.77 MIL/uL — AB (ref 3.80–5.20)
RDW: 14.8 % — ABNORMAL HIGH (ref 11.5–14.5)
WBC: 9.1 10*3/uL (ref 3.6–11.0)

## 2017-07-07 LAB — URINE CULTURE
Culture: >=100000 — AB
Special Requests: NORMAL

## 2017-07-07 LAB — GLUCOSE, CAPILLARY
GLUCOSE-CAPILLARY: 175 mg/dL — AB (ref 65–99)
GLUCOSE-CAPILLARY: 293 mg/dL — AB (ref 65–99)
GLUCOSE-CAPILLARY: 241 mg/dL — AB (ref 65–99)
Glucose-Capillary: 202 mg/dL — ABNORMAL HIGH (ref 65–99)
Glucose-Capillary: 290 mg/dL — ABNORMAL HIGH (ref 65–99)

## 2017-07-07 NOTE — Progress Notes (Signed)
[place] at [place] NAME: Abigail Boyd    MR#:  [id number]  DATE OF BIRTH:  [birth year]  SUBJECTIVE:   Pt. Here due to generalized weakness and noted to have UTI. UTI is due to ESBL and sent in for admission for IV abx.  No acute events overnight.  Patient has multidrug-resistant ESBL sensitive to only imipenem, Cipro, Zosyn.  So she needs a PICC line to go home with for IV antibiotics for 10 days. REVIEW OF SYSTEMS:    Review of Systems  Constitutional: Negative for chills and fever.  HENT: Negative for congestion and tinnitus.   Eyes: Negative for blurred vision and double vision.  Respiratory: Negative for cough, shortness of breath and wheezing.   Cardiovascular: Negative for chest pain, orthopnea and PND.  Gastrointestinal: Negative for abdominal pain, diarrhea, nausea and vomiting.  Genitourinary: Negative for dysuria and hematuria.  Neurological: Positive for weakness (Generalized). Negative for dizziness, sensory change and focal weakness.  All other systems reviewed and are negative.   Nutrition: heart healthy/Carb modified Tolerating Diet: Yes Tolerating PT: Await Eval.   DRUG ALLERGIES:   Allergies  Allergen Reactions  . Amlodipine Cough  . Clopidogrel Bisulfate Cough  . Kenalog [Triamcinolone Acetonide]     unknown  . Lisinopril Cough  . Losartan Cough  . Sulfa Antibiotics Hives and Rash    VITALS:  Blood pressure (!) 173/88, pulse 76, temperature 98 F (36.7 C), temperature source Oral, resp. rate 16, height 5\' 4"  (1.626 m), weight 83.1 kg (183 lb 4.8 oz), SpO2 95 %.  PHYSICAL EXAMINATION:   Physical Exam  GENERAL:  84 y.o.-year-old patient lying in bed in no acute distress.  EYES: Pupils equal, round, reactive to light and accommodation. No scleral icterus. Extraocular muscles intact.  HEENT: Head atraumatic, normocephalic. Oropharynx and nasopharynx clear.  NECK:  Supple, no jugular venous distention. No  thyroid enlargement, no tenderness.  LUNGS: Normal breath sounds bilaterally, no wheezing, rales, rhonchi. No use of accessory muscles of respiration.  CARDIOVASCULAR: S1, S2 normal. No murmurs, rubs, or gallops.  ABDOMEN: Soft, nontender, nondistended. Bowel sounds present. No organomegaly or mass.  EXTREMITIES: No cyanosis, clubbing or edema b/l.    NEUROLOGIC: Cranial nerves II through XII are intact. No focal Motor or sensory deficits b/l. Globally weak.   PSYCHIATRIC: The patient is alert and oriented x 3.  SKIN: No obvious rash, lesion, or ulcer.    LABORATORY PANEL:   CBC Recent Labs  Lab 07/07/17 0410  WBC 9.1  HGB 12.1  HCT 35.4  PLT 277   ------------------------------------------------------------------------------------------------------------------  Chemistries  Recent Labs  Lab 07/05/17 1229 07/06/17 0522  NA 136 136  K 4.4 4.4  CL 100* 102  CO2 28 28  GLUCOSE 151* 215*  BUN 37* 28*  CREATININE 1.11* 0.95  CALCIUM 9.4 8.9  AST 20  --   ALT 19  --   ALKPHOS 67  --   BILITOT 0.6  --    ------------------------------------------------------------------------------------------------------------------  Cardiac Enzymes No results for input(s): TROPONINI in the last 168 hours. ------------------------------------------------------------------------------------------------------------------  RADIOLOGY:  No results found.   ASSESSMENT AND PLAN:   84 year old female with past medical history of diabetes, hypertension, anxiety, incontinence, GERD, anxiety, hyperlipidemia, restless leg syndrome who presented to the hospital due to generalized weakness and noted to have urinary tract infection.  1.  Urinary tract infection-this is secondary to ESBL.  Patient had urine cultures done at her primary care physician's office which  was positive for Klebsiella which is ESBL positive. - Patient has more than 100,000 colonies of ESBL UTI and it is multidrug-resistant  and sensitive only to Zosyn, Cipro, ertapenem.  So patient needs a PICC line and ertapenem for at least 10 days   2.  Generalized weakness-secondary to UTI, continue antibiotics as mentioned above,   Physical therapy consulted. 3.  DM type II without complication-continue Lantus, NovoLog sliding scale. -Follow blood sugars which are somewhat elevated today.  4.  Anxiety-continue Paxil.  5.  Essential hypertension-continue metoprolol  6.  Hyperlipidemia-continue Crestor.  7.  Restless leg syndrome-continue Requip.     All the records are reviewed and case discussed with Care Management/Social Worker. Management plans discussed with the patient, family and they are in agreement.  CODE STATUS: DNR  DVT Prophylaxis: Lovenox  TOTAL TIME TAKING CARE OF THIS PATIENT: 30 minutes.   POSSIBLE D/C IN 1-2 DAYS, DEPENDING ON CLINICAL CONDITION.   Epifanio Lesches M.D on 07/07/2017 at 12:46 PM  Between 7am to 6pm - Pager - [phone]  After 6pm go to [email or web address] - [employer] [place] [facility]  Office  [phone]  CC: Primary care physician; Leone Haven, MD

## 2017-07-07 NOTE — Plan of Care (Signed)
  Problem: Education: Goal: Knowledge of General Education information will improve Outcome: Progressing   Problem: Activity: Goal: Risk for activity intolerance will decrease Outcome: Progressing   Problem: Nutrition: Goal: Adequate nutrition will be maintained Outcome: Progressing   Problem: Elimination: Goal: Will not experience complications related to urinary retention Outcome: Progressing   Problem: Pain Managment: Goal: General experience of comfort will improve Outcome: Progressing   Problem: Safety: Goal: Ability to remain free from injury will improve Outcome: Progressing   Problem: Skin Integrity: Goal: Risk for impaired skin integrity will decrease Outcome: Progressing

## 2017-07-07 NOTE — Progress Notes (Signed)
To patient's daughter over the phone, explained about the PICC line, IV antibiotics secondary to multidrug-resistant ESBL UTI.  She understood.  Patient was at [facility] urology and thought she had urine retention so we will check postvoid residual by bladder scan.

## 2017-07-07 NOTE — Progress Notes (Signed)
Took over pt care at 1300, pt alert with no complaints noted

## 2017-07-08 LAB — GLUCOSE, CAPILLARY
GLUCOSE-CAPILLARY: 262 mg/dL — AB (ref 65–99)
Glucose-Capillary: 189 mg/dL — ABNORMAL HIGH (ref 65–99)
Glucose-Capillary: 300 mg/dL — ABNORMAL HIGH (ref 65–99)
Glucose-Capillary: 299 mg/dL — ABNORMAL HIGH (ref 65–99)

## 2017-07-08 MED ORDER — ERTAPENEM IV (FOR PTA / DISCHARGE USE ONLY): 1.0000 g | INTRAVENOUS | 0 refills | Status: DC

## 2017-07-08 MED ORDER — METOPROLOL TARTRATE 25 MG PO TABS: 25.0000 mg | ORAL_TABLET | Freq: Two times a day (BID) | ORAL | 0 refills | Status: DC

## 2017-07-08 MED ORDER — CIPROFLOXACIN HCL 500 MG PO TABS
500.0000 mg | ORAL_TABLET | Freq: Two times a day (BID) | ORAL | Status: DC
  Administered 2017-07-08 – 2017-07-09 (×2): 500 mg via ORAL
  Filled 2017-07-08 (×2): qty 1

## 2017-07-08 MED ORDER — CIPROFLOXACIN HCL 500 MG PO TABS: 500.0000 mg | ORAL_TABLET | Freq: Two times a day (BID) | ORAL | 0 refills | Status: AC

## 2017-07-08 NOTE — Progress Notes (Signed)
Pt x1 assist with walker, OOB to char, pt remains weak

## 2017-07-08 NOTE — Progress Notes (Signed)
[place] at [place] NAME: Toya Palacios    MR#:  [id number]  DATE OF BIRTH:  1929/04/22  SUBJECTIVE: Patient had a PICC line yesterday.  Patient denies any complaints.  He is on ertapenem 1 g IV daily.   Pt. Here due to generalized weakness and noted to have UTI. UTI is due to ESBL and sent in for admission for IV abx.  No acute events overnight.  Patient has multidrug-resistant ESBL sensitive to only imipenem, Cipro, Zosyn.  So she needs a PICC line to go home with for IV antibiotics for 10 days. REVIEW OF SYSTEMS:    Review of Systems  Constitutional: Negative for chills and fever.  HENT: Negative for congestion and tinnitus.   Eyes: Negative for blurred vision and double vision.  Respiratory: Negative for cough, shortness of breath and wheezing.   Cardiovascular: Negative for chest pain, orthopnea and PND.  Gastrointestinal: Negative for abdominal pain, diarrhea, nausea and vomiting.  Genitourinary: Negative for dysuria and hematuria.  Neurological: Positive for weakness (Generalized). Negative for dizziness, sensory change and focal weakness.  All other systems reviewed and are negative.   Nutrition: heart healthy/Carb modified Tolerating Diet: Yes Tolerating PT: Await Eval.   DRUG ALLERGIES:   Allergies  Allergen Reactions  . Amlodipine Cough  . Clopidogrel Bisulfate Cough  . Kenalog [Triamcinolone Acetonide]     unknown  . Lisinopril Cough  . Losartan Cough  . Sulfa Antibiotics Hives and Rash    VITALS:  Blood pressure (!) 125/57, pulse 74, temperature 98.5 F (36.9 C), temperature source Oral, resp. rate 18, height 5\' 4"  (1.626 m), weight 83.1 kg (183 lb 4.8 oz), SpO2 96 %.  PHYSICAL EXAMINATION:   Physical Exam  GENERAL:  84 y.o.-year-old patient lying in bed in no acute distress.  EYES: Pupils equal, round, reactive to light and accommodation. No scleral icterus. Extraocular muscles intact.  HEENT: Head atraumatic,  normocephalic. Oropharynx and nasopharynx clear.  NECK:  Supple, no jugular venous distention. No thyroid enlargement, no tenderness.  LUNGS: Normal breath sounds bilaterally, no wheezing, rales, rhonchi. No use of accessory muscles of respiration.  CARDIOVASCULAR: S1, S2 normal. No murmurs, rubs, or gallops.  ABDOMEN: Soft, nontender, nondistended. Bowel sounds present. No organomegaly or mass.  EXTREMITIES: No cyanosis, clubbing or edema b/l.    NEUROLOGIC: Cranial nerves II through XII are intact. No focal Motor or sensory deficits b/l. Globally weak.   PSYCHIATRIC: The patient is alert and oriented x 3.  SKIN: No obvious rash, lesion, or ulcer.    LABORATORY PANEL:   CBC Recent Labs  Lab 07/07/17 0410  WBC 9.1  HGB 12.1  HCT 35.4  PLT 277   ------------------------------------------------------------------------------------------------------------------  Chemistries  Recent Labs  Lab 07/05/17 1229 07/06/17 0522  NA 136 136  K 4.4 4.4  CL 100* 102  CO2 28 28  GLUCOSE 151* 215*  BUN 37* 28*  CREATININE 1.11* 0.95  CALCIUM 9.4 8.9  AST 20  --   ALT 19  --   ALKPHOS 67  --   BILITOT 0.6  --    ------------------------------------------------------------------------------------------------------------------  Cardiac Enzymes No results for input(s): TROPONINI in the last 168 hours. ------------------------------------------------------------------------------------------------------------------  RADIOLOGY:  [place] Ekg Site Rite  Result Date: 07/07/2017 If Site Rite image not attached, placement could not be confirmed due to current cardiac rhythm.    ASSESSMENT AND PLAN:   84 year old female with past medical history of diabetes, hypertension, anxiety, incontinence, GERD, anxiety, hyperlipidemia,  restless leg syndrome who presented to the hospital due to generalized weakness and noted to have urinary tract infection.  1.  Urinary tract infection-this is secondary  to ESBL.  Patient had urine cultures done at her primary care physician's office which was positive for Klebsiella which is ESBL positive. - Patient has more than 100,000 colonies of ESBL UTI and it is multidrug-resistant and sensitive only to Zosyn, Cipro, ertapenem.  Patient had a PICC line yesterday for 10 days of IV ertapenem.  His home health nurse for IV antibiotics and also teaching the daughter on how to go.  I spoke with case manager Colletta Maryland to discuss.    2.  Generalized weakness-secondary to UTI, continue antibiotics as mentioned above,   Sickle therapy recommended home health physical therapy. 3.  DM type II without complication-continue Lantus, NovoLog sliding scale. -Follow blood sugars which are somewhat elevated today.  4.  Anxiety-continue Paxil.  5.  Essential hypertension-continue metoprolol  6.  Hyperlipidemia-continue Crestor.  7.  Restless leg syndrome-continue Requip.     All the records are reviewed and case discussed with Care Management/Social Worker. Management plans discussed with the patient, family and they are in agreement.  CODE STATUS: DNR  DVT Prophylaxis: Lovenox  TOTAL TIME TAKING CARE OF THIS PATIENT: 30 minutes.   POSSIBLE D/C IN 1-2 DAYS, DEPENDING ON CLINICAL CONDITION.   Epifanio Lesches M.D on 07/08/2017 at 12:13 PM  Between 7am to 6pm - Pager - [phone]  After 6pm go to [email or web address] - [employer] [place] [facility]  Office  [phone]  CC: Primary care physician; Leone Haven, MD

## 2017-07-08 NOTE — Progress Notes (Deleted)
PHARMACY CONSULT NOTE FOR:  OUTPATIENT  PARENTERAL ANTIBIOTIC THERAPY (OPAT)  Indication: ESBL UTI Regimen: Ciprofloxacin 400mg  IV q12h  End date: 07/15/17  IV antibiotic discharge orders are pended. To discharging provider:  please sign these orders via discharge navigator,  Select New Orders & click on the button choice - Manage This Unsigned Work.     Thank you for allowing pharmacy to be a part of this patient's care.  Candelaria Stagers, PharmD Pharmacy Resident  07/08/2017, 12:35 PM

## 2017-07-08 NOTE — Care Management Note (Signed)
Case Management Note  Patient Details  Name: Abigail Boyd MRN: [id number] Date of Birth: 1929/10/25   Patient to discharge home today.  Assessment completed with daughter.  Initially patient was to discharge with IV antibiotics, however now patient will change to oral antibiotics.  MD has updated family.  Patient lives at home with daughter.  Patient has PCS services m-f from 11am-5pm.  PCP [place] Patient has RW in the home. [facility] was selected for home health agency.  Referral sent to [facility] with [facility].  [name] was notified to cancel home IV antibiotics.      Subjective/Objective:                    Action/Plan:   Expected Discharge Date:  07/08/17               Expected Discharge Plan:  [place]  In-House Referral:     Discharge planning Services  CM Consult  Post Acute Care Choice:  Home Health Choice offered to:  Adult Children  DME Arranged:    DME Agency:     HH Arranged:  RN, PT, OT, Nurse's Aide [place] Agency:  [place]  Status of Service:  Completed, signed off  If discussed at [place] of Stay Meetings, dates discussed:    Additional Comments:  Beverly Sessions, RN 07/08/2017, 1:10 PM

## 2017-07-08 NOTE — Progress Notes (Signed)
Dr Vianne Bulls made aware that pts daughter doesn't feel safe taking pt home, daughter requesting rehab, new order to discontinue discharge

## 2017-07-08 NOTE — Progress Notes (Signed)
Patient stable for discharge.  Discussed the urine culture results with Dr. Ola Spurr.  Patient has Klebsiella UTI and it is sensitive to Cipro so he recommended to give Cipro for 10 days.  Discharging the patient with Cipro 500 mg twice daily for 10 days, patient received 3 days of ertapenem.  Postvoid residual is around 270 mL.  Patient has UTI.  Patient already is followed by [facility] urology.  Patient can continue Myrbetriq, follow-up with [facility] urology.  [facility] and previously she refused indwelling Foley catheter and she was told not to have constipation.  Patient is on stool softeners and patient daughter told me she is on tramadol for knee pain and she takes it twice a day.  I told the patient daughter about the urine culture result and also the discussing with Dr. Ola Spurr so we will remove the PICC line, discharge patient home with Cipro 5 mg twice daily for 10 days.  Patient also is on high-dose Toujeo 85 units at night, 22 units of Humalog with each meals.  According to patient's daughter, patient was on Toujeo 40 units in the morning, 40 units at night but the dose has been changed to 85 units at night recently.  Discharge home with home health today. Discussed with patient daughter, more than 50% of time spent in counseling and coordination of care.  Discharge summary to follow.  Discussed the plan with patient's daughter, patient's nurse, case Freight forwarder.

## 2017-07-09 DIAGNOSIS — I480 Paroxysmal atrial fibrillation: Secondary | ICD-10-CM | POA: Diagnosis not present

## 2017-07-09 DIAGNOSIS — G9340 Encephalopathy, unspecified: Secondary | ICD-10-CM | POA: Diagnosis not present

## 2017-07-09 DIAGNOSIS — M79604 Pain in right leg: Secondary | ICD-10-CM | POA: Diagnosis not present

## 2017-07-09 DIAGNOSIS — Z823 Family history of stroke: Secondary | ICD-10-CM | POA: Diagnosis not present

## 2017-07-09 DIAGNOSIS — I2510 Atherosclerotic heart disease of native coronary artery without angina pectoris: Secondary | ICD-10-CM | POA: Diagnosis not present

## 2017-07-09 DIAGNOSIS — I130 Hypertensive heart and chronic kidney disease with heart failure and stage 1 through stage 4 chronic kidney disease, or unspecified chronic kidney disease: Secondary | ICD-10-CM | POA: Diagnosis not present

## 2017-07-09 DIAGNOSIS — G4733 Obstructive sleep apnea (adult) (pediatric): Secondary | ICD-10-CM | POA: Diagnosis not present

## 2017-07-09 DIAGNOSIS — Z7901 Long term (current) use of anticoagulants: Secondary | ICD-10-CM | POA: Diagnosis not present

## 2017-07-09 DIAGNOSIS — E1140 Type 2 diabetes mellitus with diabetic neuropathy, unspecified: Secondary | ICD-10-CM | POA: Diagnosis not present

## 2017-07-09 DIAGNOSIS — I639 Cerebral infarction, unspecified: Secondary | ICD-10-CM | POA: Diagnosis not present

## 2017-07-09 DIAGNOSIS — F419 Anxiety disorder, unspecified: Secondary | ICD-10-CM | POA: Diagnosis present

## 2017-07-09 DIAGNOSIS — R29818 Other symptoms and signs involving the nervous system: Secondary | ICD-10-CM | POA: Diagnosis not present

## 2017-07-09 DIAGNOSIS — I10 Essential (primary) hypertension: Secondary | ICD-10-CM | POA: Diagnosis not present

## 2017-07-09 DIAGNOSIS — Z7401 Bed confinement status: Secondary | ICD-10-CM | POA: Diagnosis not present

## 2017-07-09 DIAGNOSIS — H2589 Other age-related cataract: Secondary | ICD-10-CM | POA: Diagnosis not present

## 2017-07-09 DIAGNOSIS — Z794 Long term (current) use of insulin: Secondary | ICD-10-CM | POA: Diagnosis not present

## 2017-07-09 DIAGNOSIS — R339 Retention of urine, unspecified: Secondary | ICD-10-CM | POA: Diagnosis not present

## 2017-07-09 DIAGNOSIS — R51 Headache: Secondary | ICD-10-CM | POA: Diagnosis not present

## 2017-07-09 DIAGNOSIS — N3000 Acute cystitis without hematuria: Secondary | ICD-10-CM | POA: Diagnosis not present

## 2017-07-09 DIAGNOSIS — J45909 Unspecified asthma, uncomplicated: Secondary | ICD-10-CM | POA: Diagnosis not present

## 2017-07-09 DIAGNOSIS — N390 Urinary tract infection, site not specified: Secondary | ICD-10-CM | POA: Diagnosis not present

## 2017-07-09 DIAGNOSIS — K219 Gastro-esophageal reflux disease without esophagitis: Secondary | ICD-10-CM | POA: Diagnosis not present

## 2017-07-09 DIAGNOSIS — H409 Unspecified glaucoma: Secondary | ICD-10-CM | POA: Diagnosis not present

## 2017-07-09 DIAGNOSIS — I6381 Other cerebral infarction due to occlusion or stenosis of small artery: Secondary | ICD-10-CM | POA: Diagnosis not present

## 2017-07-09 DIAGNOSIS — Z8249 Family history of ischemic heart disease and other diseases of the circulatory system: Secondary | ICD-10-CM | POA: Diagnosis not present

## 2017-07-09 DIAGNOSIS — E119 Type 2 diabetes mellitus without complications: Secondary | ICD-10-CM | POA: Diagnosis not present

## 2017-07-09 DIAGNOSIS — E1122 Type 2 diabetes mellitus with diabetic chronic kidney disease: Secondary | ICD-10-CM | POA: Diagnosis not present

## 2017-07-09 DIAGNOSIS — J302 Other seasonal allergic rhinitis: Secondary | ICD-10-CM | POA: Diagnosis not present

## 2017-07-09 DIAGNOSIS — Z8673 Personal history of transient ischemic attack (TIA), and cerebral infarction without residual deficits: Secondary | ICD-10-CM | POA: Diagnosis not present

## 2017-07-09 DIAGNOSIS — R531 Weakness: Secondary | ICD-10-CM | POA: Diagnosis not present

## 2017-07-09 DIAGNOSIS — Z9861 Coronary angioplasty status: Secondary | ICD-10-CM | POA: Diagnosis not present

## 2017-07-09 DIAGNOSIS — R29810 Facial weakness: Secondary | ICD-10-CM | POA: Diagnosis not present

## 2017-07-09 DIAGNOSIS — Z66 Do not resuscitate: Secondary | ICD-10-CM | POA: Diagnosis not present

## 2017-07-09 DIAGNOSIS — G9341 Metabolic encephalopathy: Secondary | ICD-10-CM | POA: Diagnosis not present

## 2017-07-09 DIAGNOSIS — Z90710 Acquired absence of both cervix and uterus: Secondary | ICD-10-CM | POA: Diagnosis not present

## 2017-07-09 DIAGNOSIS — M150 Primary generalized (osteo)arthritis: Secondary | ICD-10-CM | POA: Diagnosis not present

## 2017-07-09 DIAGNOSIS — E1169 Type 2 diabetes mellitus with other specified complication: Secondary | ICD-10-CM | POA: Diagnosis not present

## 2017-07-09 DIAGNOSIS — R4701 Aphasia: Secondary | ICD-10-CM | POA: Diagnosis not present

## 2017-07-09 DIAGNOSIS — Z955 Presence of coronary angioplasty implant and graft: Secondary | ICD-10-CM | POA: Diagnosis not present

## 2017-07-09 DIAGNOSIS — I708 Atherosclerosis of other arteries: Secondary | ICD-10-CM | POA: Diagnosis not present

## 2017-07-09 DIAGNOSIS — G8929 Other chronic pain: Secondary | ICD-10-CM | POA: Diagnosis not present

## 2017-07-09 DIAGNOSIS — N182 Chronic kidney disease, stage 2 (mild): Secondary | ICD-10-CM | POA: Diagnosis not present

## 2017-07-09 DIAGNOSIS — R29709 NIHSS score 9: Secondary | ICD-10-CM | POA: Diagnosis not present

## 2017-07-09 DIAGNOSIS — E7849 Other hyperlipidemia: Secondary | ICD-10-CM | POA: Diagnosis not present

## 2017-07-09 DIAGNOSIS — M6281 Muscle weakness (generalized): Secondary | ICD-10-CM | POA: Diagnosis not present

## 2017-07-09 DIAGNOSIS — Z79899 Other long term (current) drug therapy: Secondary | ICD-10-CM | POA: Diagnosis not present

## 2017-07-09 DIAGNOSIS — R4020 Unspecified coma: Secondary | ICD-10-CM | POA: Diagnosis not present

## 2017-07-09 DIAGNOSIS — G2581 Restless legs syndrome: Secondary | ICD-10-CM | POA: Diagnosis not present

## 2017-07-09 DIAGNOSIS — I5032 Chronic diastolic (congestive) heart failure: Secondary | ICD-10-CM | POA: Diagnosis not present

## 2017-07-09 DIAGNOSIS — E785 Hyperlipidemia, unspecified: Secondary | ICD-10-CM | POA: Diagnosis not present

## 2017-07-09 DIAGNOSIS — I69351 Hemiplegia and hemiparesis following cerebral infarction affecting right dominant side: Secondary | ICD-10-CM | POA: Diagnosis not present

## 2017-07-09 LAB — GLUCOSE, CAPILLARY
GLUCOSE-CAPILLARY: 257 mg/dL — AB (ref 65–99)
GLUCOSE-CAPILLARY: 291 mg/dL — AB (ref 65–99)
Glucose-Capillary: 175 mg/dL — ABNORMAL HIGH (ref 65–99)

## 2017-07-09 NOTE — Progress Notes (Signed)
Report called to Otila Kluver, Therapist, sports at [facility] and EMS contacted for transport.  Made patient aware that she is next on the list.  Clarise Cruz, RN, BSN

## 2017-07-09 NOTE — Progress Notes (Signed)
Daughter requesting another physical therapy evaluation, case manager spoke to patient's daughter, patient insurance will not approve her for rehab stay as per documentation.

## 2017-07-09 NOTE — Care Management Note (Signed)
Case Management Note  Patient Details  Name: Abigail Boyd MRN: [id number] Date of Birth: 06-09-1929  Subjective/Objective: Home today                   Action/Plan: Corene Cornea with [facility] made aware of discharge.   Expected Discharge Date:  07/09/17               Expected Discharge Plan:  [place]  In-House Referral:     Discharge planning Services  CM Consult  Post Acute Care Choice:  Home Health Choice offered to:  Adult Children  DME Arranged:    DME Agency:     HH Arranged:  RN, PT, OT, Nurse's Aide [place] Agency:  [place]  Status of Service:  Completed, signed off  If discussed at [place] of Stay Meetings, dates discussed:    Additional Comments:  Jolly Mango, RN 07/09/2017, 3:05 PM

## 2017-07-09 NOTE — Discharge Summary (Addendum)
Abigail Boyd, is a 84 y.o. female  DOB 03/21/1929  MRN [id number].  Admission date:  07/05/2017  Admitting Physician  Saundra Shelling, MD  Discharge Date:  07/09/2017   Primary MD  Leone Haven, MD  Recommendations for primary care physician for things to follow:   Follow-up with PCP in 1 week   Admission Diagnosis  Weakness [R53.1] Acute cystitis without hematuria [N30.00]   Discharge Diagnosis  Weakness [R53.1] Acute cystitis without hematuria [N30.00]    Active Problems:   UTI (urinary tract infection)      Past Medical History:  Diagnosis Date  . Abnormal nuclear stress test 06/21/2011   Inferolateral reversible defect;--> cardiac cath & PCI of CxOM, occluded RCA with collaterals;; followup Myoview 11/2012: Low risk. Fixed basal inferior artifact normal EF. No ischemia  . Anxiety    When necessary Xanax  . Anxiety   . Asthma   . Bilateral cataracts    Status post stroke or correction  . CAD S/P percutaneous coronary angioplasty 06/2011   s/P PCI to proximal OM1 w/ DES; occluded RCA with bridging and L-R collaterals (medical management)  . Chronic kidney disease (CKD), stage II (mild)    Related to current bladder infections (although diabetes cannot be excluded)  . Chronotropic incompetence with sinus node dysfunction [facility]) October 2013   On CPET test; beta blockers reduced  . Diabetes mellitus type 2, controlled ([place])    On oral medications  . Diverticulitis   . Essential hypertension    Allowing for permissive hypertension to avoid orthostatic hypotension  . GERD (gastroesophageal reflux disease)    On PPI  . Glaucoma   . History of syncope    Per EP - neurocardiogenic & not Bradycardia related (no PPM)  . History of unstable angina 06/13/2011   Jaw pain awakening from sleep -- Myoview --CATH -->  PCI  . Hyperlipidemia with target LDL less than 70    HDL at goal, LDL not at goal, borderline triglycerides. On Crestor 20 mg  . Migraines   . OSA (obstructive sleep apnea)    hx bladder infections  . Osteoarthritis   . PAF (paroxysmal atrial fibrillation) ([place]) 10-11/ 2014   CardioNet Event Monitor: NSR & S Brady -- Rates 50-100; Total A. fib burden 35 hours and 27 minutes. 1296 episodes, longest was 1 hour 29 minutes. Rate ranged from 52-169 beats per minute.  . Seasonal allergies   . Shortness of breath on exertion October 2013   2-D echo: Normal EF>55%, Gr 1DD, mild aortic sclerosis; Evaluated with CPET - peak VO2 97%; Chronotropic Incompetence (submaximal effort)  . Spinal stenosis of lumbar region 11/2011  . Stroke ([place])   . Tachycardia-bradycardia syndrome ([place]) 11/2012  . Urge incontinence     Past Surgical History:  Procedure Laterality Date  . APPENDECTOMY    . BREAST BIOPSY     both breast  . cataracts     both eyes  . COLONOSCOPY    . CORONARY ANGIOPLASTY WITH STENT PLACEMENT  07/05/2011   Promus Element DES 2.5 mm x 16 mm-post dilated to 2.65 mm CX-Proximal OM1  . Holter Monitor  04/2015   Sinus rhythm with rates 52-149 BPM. Isolated PACs with rare couplets and bigeminy. Multiple short runs of PAT/PSVT. Arrhythmia run 120 bpm for 204 beats. 14% of time was in A. fib/flutter. - Reviewed by Dr. Caryl Comes. Not thought to be significant enough for her syncope.  Marland Kitchen KNEE ARTHROSCOPY WITH MEDIAL MENISECTOMY Right 06/24/2014  Procedure: RIGHT KNEE ARTHROSCOPY WITH partial lateral MENISECTOMY, abrasion chondroplasty of medial femoral condyle and patella, microfracture technique;  Surgeon: Latanya Maudlin, MD;  Location: [facility];  Service: Orthopedics;  Laterality: Right;  . LEFT HEART CATHETERIZATION WITH CORONARY ANGIOGRAM N/A 07/06/2011   Procedure: LEFT HEART CATHETERIZATION WITH CORONARY ANGIOGRAM;  Surgeon: Leonie Man, MD;  Location: [facility] CATH LAB: Unstable Angina --> Myoview wiht  Inf-Lat Ischemia.  Proximal OM1 lesion --> PCI; 100% mid RCA with right to right and left to right bridging collaterals from circumflex RPL and LAD the PDA.  Marland Kitchen NM MYOVIEW LTD  October 2014    Low risk. Fixed basal inferior artifact normal EF. No ischemia --> (as compared to pre-PCI Myoview revealing inferolateral ischemia)  . rotator cuff Right   . TRANSTHORACIC ECHOCARDIOGRAM  11/2014   EF 65-70%, Mod LVH. Normal wall motion. Gr 1 DD. Normal valves.  Marland Kitchen VAGINAL HYSTERECTOMY         History of present illness and  Hospital Course:     Kindly see H&P for history of present illness and admission details, please review complete Labs, Consult reports and Test reports for all details in brief  HPI  from the history and physical done on the day of admission  84 year old female patient is to have diabetes mellitus type 2, CAD, CKD stage II, GERD came in for generalized weakness, found to have UTI with urine cultures done by PCP showing Klebsiella.  Hospital Course  Klebsiella UTI multidrug-resistant.  Patient Klebsiella UTI sensitive to Cipro.  Saw Dr. Ola Spurr recommended discharging her home with Cipro 500 mg twice daily for 10 days.  Initially patient received meropenem 1 g IV daily for 3 days. 2.  Dehydration improved with IV fluids 3.  Generalized weakness, deconditioning: Physical therapy recommended home health physical therapy, mentioned that she will not be able to take care of her at home and she is needing a lot of help.  Unfortunately according to case management her insurance will not pay for her to go to SNF.  But patient daughter is very concerned about that.  Patient high risk for recurrent admission. #4 diabetes mellitus type 2: Sent is on high-dose Toujeo 85 units at night, Humalog 22 units with meals. History of urine retention, patient is on Myrbetriq , Enablex by her urologist.  Patient followed by [facility] urology and she needs to follow-up with them as an outpatient.   According to documentation she refused indwelling Foley before. Constipation: Use stool softeners every day and prevent constipation.    Discharge Condition: stable   Follow UP   Contact information for follow-up providers    Leone Haven, MD. Go on 07/24/2017.   Specialty:  Family Medicine Why:  11:15 AM Contact information: [place] [address] [phone]        [facility] In 1 week.   Why:  called patient daughter to make appointment           Contact information for after-discharge care    [facility] .   Service:  Skilled Nursing Contact information: [place] [phone]                    Discharge Instructions  and  Discharge Medications     Discharge Instructions    Home infusion instructions [facility] May follow [place] Dosing Protocol; May administer Cathflo as needed to maintain  patency of vascular access device.; Flushing of vascular access device: per [facility] Protocol: 0.9% NaCl pre/post medica...   Complete by:  As directed    Instructions:  May follow [place] Dosing Protocol   Instructions:  May administer Cathflo as needed to maintain patency of vascular access device.   Instructions:  Flushing of vascular access device: per [facility] Protocol: 0.9% NaCl pre/post medication administration and prn patency; Heparin 100 u/ml, 52ml for implanted ports and Heparin 10u/ml, 69ml for all other central venous catheters.   Instructions:  May follow [facility] Anaphylaxis Protocol for First Dose Administration in the home: 0.9% NaCl at 25-50 ml/hr to maintain IV access for protocol meds. Epinephrine 0.3 ml IV/IM PRN and Benadryl 25-50 IV/IM PRN s/s of anaphylaxis.   Instructions:  [facility] Infusion Coordinator (RN) to assist per patient IV care needs in the home PRN.     Allergies as of 07/09/2017      Reactions   Amlodipine  Cough   Clopidogrel Bisulfate Cough   Kenalog [triamcinolone Acetonide]    unknown   Lisinopril Cough   Losartan Cough   Sulfa Antibiotics Hives, Rash      Medication List    STOP taking these medications   acetaminophen 650 MG CR tablet Commonly known as:  TYLENOL   insulin aspart 100 UNIT/ML injection Commonly known as:  novoLOG   metFORMIN 500 MG 24 hr tablet Commonly known as:  GLUCOPHAGE-XR     TAKE these medications   ALPRAZolam 0.25 MG tablet Commonly known as:  XANAX TAKE 1 TABLET BY MOUTH AT BEDTIME AS NEEDED SLEEP   azelastine 0.1 % nasal spray Commonly known as:  ASTELIN Place 1-2 sprays into both nostrils at bedtime. Use in each nostril as directed   ciprofloxacin 500 MG tablet Commonly known as:  CIPRO Take 1 tablet (500 mg total) by mouth 2 (two) times daily for 10 days.   estradiol 0.1 MG/GM vaginal cream Commonly known as:  ESTRACE VAGINAL Apply 0.5mg  (pea-sized amount)  just inside the vaginal introitus with a finger-tip on Monday, Wednesday and Friday nights.   fish oil-omega-3 fatty acids 1000 MG capsule Take 1 g by mouth daily.   furosemide 20 MG tablet Commonly known as:  LASIX Take 2 tablets (40 mg total) by mouth daily. What changed:    how much to take  how to take this  when to take this  additional instructions   gabapentin 300 MG capsule Commonly known as:  NEURONTIN Take 300 mg by mouth 3 (three) times daily.   HUMALOG KWIKPEN 100 UNIT/ML KiwkPen Generic drug:  insulin lispro Inject 22 Units into the skin 3 (three) times daily with meals.   ipratropium 0.06 % nasal spray Commonly known as:  ATROVENT Place 1 spray into the nose daily.   levocetirizine 5 MG tablet Commonly known as:  XYZAL Take 5 mg by mouth every evening.   loratadine 10 MG tablet Commonly known as:  CLARITIN Take 10 mg by mouth every evening.   LUMIGAN 0.01 % Soln Generic drug:  bimatoprost Place 1 drop into both eyes at bedtime.   Magnesium  500 MG Caps Take 1,000 mg by mouth at bedtime.   metoprolol tartrate 25 MG tablet Commonly known as:  LOPRESSOR Take 1 tablet (25 mg total) by mouth 2 (two) times daily. What changed:  See the new instructions.   MYRBETRIQ 50 MG Tb24 tablet Generic drug:  mirabegron ER Take one tablet by mouth once daily for kidneys  nitroGLYCERIN 0.4 MG SL tablet Commonly known as:  NITROSTAT Place 0.4 mg under the tongue every 5 (five) minutes as needed for chest pain.   nystatin cream Commonly known as:  MYCOSTATIN Apply 1 application topically daily.   pantoprazole 40 MG tablet Commonly known as:  PROTONIX Take 1 tablet (40 mg total) by mouth daily.   polyethylene glycol packet Commonly known as:  MIRALAX / GLYCOLAX Take 17 g by mouth daily as needed for moderate constipation.   Rivaroxaban 15 MG Tabs tablet Commonly known as:  XARELTO Take 15 mg by mouth every evening.   rOPINIRole 0.5 MG tablet Commonly known as:  REQUIP TAKE 1 TABLET BY MOUTH AT  BEDTIME   sitaGLIPtin 50 MG tablet Commonly known as:  JANUVIA Take 50 mg by mouth at bedtime.   STOOL SOFTENER 100 MG capsule Generic drug:  docusate sodium Take 100 mg by mouth at bedtime.   timolol 0.5 % ophthalmic solution Commonly known as:  BETIMOL Place 1 drop into both eyes every morning.   TOUJEO MAX SOLOSTAR 300 UNIT/ML Sopn Generic drug:  Insulin Glargine Inject 85 Units into the skin at bedtime.   traMADol 50 MG tablet Commonly known as:  ULTRAM Take 1 tablet (50 mg total) by mouth 2 (two) times daily.            Home Infusion Instuctions  (From admission, onward)        Start     Ordered   07/08/17 0000  Home infusion instructions [facility] May follow [place] Dosing Protocol; May administer Cathflo as needed to maintain patency of vascular access device.; Flushing of vascular access device: per [facility] Protocol: 0.9% NaCl pre/post medica...    Question Answer Comment  Instructions May follow  [place] Dosing Protocol   Instructions May administer Cathflo as needed to maintain patency of vascular access device.   Instructions Flushing of vascular access device: per [facility] Protocol: 0.9% NaCl pre/post medication administration and prn patency; Heparin 100 u/ml, 19ml for implanted ports and Heparin 10u/ml, 5ml for all other central venous catheters.   Instructions May follow [facility] Anaphylaxis Protocol for First Dose Administration in the home: 0.9% NaCl at 25-50 ml/hr to maintain IV access for protocol meds. Epinephrine 0.3 ml IV/IM PRN and Benadryl 25-50 IV/IM PRN s/s of anaphylaxis.   Instructions [facility] Infusion Coordinator (RN) to assist per patient IV care needs in the home PRN.      07/08/17 1220        Diet and Activity recommendation: See Discharge Instructions above   Consults obtained -physical therapy   Major procedures and Radiology Reports - PLEASE review detailed and final reports for all details, in brief -    [place] Ekg Site Rite  Result Date: 07/07/2017 If Site Rite image not attached, placement could not be confirmed due to current cardiac rhythm.   Micro Results    Recent Results (from the past 240 hour(s))  Urine culture     Status: Abnormal   Collection Time: 07/05/17  4:35 PM  Result Value Ref Range Status   Specimen Description   Final    URINE, RANDOM Performed at [facility], [address], [place], [place] [address]    Special Requests   Final    Normal Performed at [facility], [address] [place], [place] [address]    Culture (A)  Final    >=100,000 COLONIES/mL KLEBSIELLA PNEUMONIAE Confirmed Extended Spectrum Beta-Lactamase Producer (ESBL).  In bloodstream infections from ESBL organisms, carbapenems are preferred over piperacillin/tazobactam. They are shown to have a lower risk of mortality. Performed at [facility], [place] [address], [place], [place] [address]    Report Status 07/07/2017 FINAL   Final   Organism ID, Bacteria KLEBSIELLA PNEUMONIAE (A)  Final      Susceptibility   Klebsiella pneumoniae - MIC*    AMPICILLIN >=32 RESISTANT Resistant     CEFAZOLIN >=64 RESISTANT Resistant     CEFTRIAXONE >=64 RESISTANT Resistant     CIPROFLOXACIN 1 SENSITIVE Sensitive     GENTAMICIN >=16 RESISTANT Resistant     IMIPENEM <=0.25 SENSITIVE Sensitive     NITROFURANTOIN 64 INTERMEDIATE Intermediate     TRIMETH/SULFA >=320 RESISTANT Resistant     AMPICILLIN/SULBACTAM >=32 RESISTANT Resistant     PIP/TAZO 16 SENSITIVE Sensitive     Extended ESBL POSITIVE Resistant     * >=100,000 COLONIES/mL KLEBSIELLA PNEUMONIAE       Today   Subjective:   Marybella Hartinger today has no headache,no chest abdominal pain,no new weakness tingling or numbness, feels much better wants to go home today.   Objective:   Blood pressure (!) 153/73, pulse 70, temperature 98.2 F (36.8 C), temperature source Oral, resp. rate 20, height 5\' 4"  (1.626 m), weight 83.1 kg (183 lb 4.8 oz), SpO2 93 %.   Intake/Output Summary (Last 24 hours) at 07/09/2017 1654 Last data filed at 07/09/2017 0500 Gross per 24 hour  Intake -  Output 600 ml  Net -600 ml    Exam Awake Alert, Oriented x 3, No new F.N deficits, Normal affect Albert.AT,PERRAL Supple Neck,No JVD, No cervical lymphadenopathy appriciated.  Symmetrical Chest wall movement, Good air movement bilaterally, CTAB RRR,No Gallops,Rubs or new Murmurs, No Parasternal Heave +ve B.Sounds, Abd Soft, Non tender, No organomegaly appriciated, No rebound -guarding or rigidity. No Cyanosis, Clubbing or edema, No new Rash or bruise  Data Review   CBC w Diff:  Lab Results  Component Value Date   WBC 9.1 07/07/2017   HGB 12.1 07/07/2017   HGB 11.0 (L) 01/27/2015   HCT 35.4 07/07/2017   HCT 33.6 (L) 01/27/2015   PLT 277 07/07/2017   PLT 262 01/27/2015   LYMPHOPCT 10 04/23/2017   MONOPCT 7 04/23/2017   EOSPCT 1 04/23/2017   BASOPCT 1 04/23/2017    CMP:  Lab Results   Component Value Date   NA 136 07/06/2017   NA 140 04/22/2015   K 4.4 07/06/2017   CL 102 07/06/2017   CO2 28 07/06/2017   BUN 28 (H) 07/06/2017   BUN 26 04/22/2015   CREATININE 0.95 07/06/2017   GLU 200 01/11/2015   PROT 7.9 07/05/2017   ALBUMIN 4.2 07/05/2017   BILITOT 0.6 07/05/2017   ALKPHOS 67 07/05/2017   AST 20 07/05/2017   ALT 19 07/05/2017  .   Total Time in preparing paper work, data evaluation and todays exam - 35 minutes  Lavon Horn M.D on 07/09/2017 at 4:54 PM    Note: This dictation was prepared with Dragon dictation along with smaller phrase technology. Any transcriptional errors that result from this process are unintentional.

## 2017-07-09 NOTE — Progress Notes (Signed)
Inpatient Diabetes Program Recommendations  AACE/ADA: New Consensus Statement on Inpatient Glycemic Control (2015)  Target Ranges:  Prepandial:   less than 140 mg/dL      Peak postprandial:   less than 180 mg/dL (1-2 hours)      Critically ill patients:  140 - 180 mg/dL   Results for Abigail Boyd, Abigail Boyd (MRN [id number]) as of 07/09/2017 11:11  Ref. Range 07/08/2017 08:03 07/08/2017 11:57 07/08/2017 17:51 07/08/2017 21:27  Glucose-Capillary Latest Ref Range: 65 - 99 mg/dL 189 (H)  3 units NOVOLOG +  15 units LANTUS at 9:30am 262 (H)  8 units NOVOLOG 300 (H)  8 units NOVOLOG 299 (H)  3 units NOVOLOG +  15 units LANTUS    Results for Abigail Boyd, Abigail Boyd (MRN [id number]) as of 07/09/2017 11:11  Ref. Range 07/09/2017 07:39  Glucose-Capillary Latest Ref Range: 65 - 99 mg/dL 175 (H)     Home DM Meds: Toujeo 85 units QHS        Humalog 22 units TID        Metformin 500 mg daily       Januvia 50 mg QHS  Current Orders: Lantus 15 units BID      Novolog Moderate Correction Scale/ SSI (0-15 units) TID AC + HS     Tradjenta 5 mg daily     Initial Consult with Endocrinology Department Malissa Hippo, NP with [facility]) on 05/28/2017.  At that visit, patient was instructed to take the following:  1. Discontinue Januvia 2. Discontinue Lantus 3. Continue Humalog 22 units TID with meals  4. Start Toujeo basal insulin 85 units QHS 5. Start Metformin XR 500 mg QAM    MD- Please consider the following in-hospital insulin adjustments:  1. Increase Lantus slightly to 18 units BID  2. Start Novolog Meal Coverage: Novolog 10 units TID with meals (hold if pt eats <50% of meal)  Patient takes Humalog 22 units TID with meals at home    --Will follow patient during hospitalization--  Wyn Quaker RN, MSN, CDE Diabetes Coordinator Inpatient Glycemic Control Team Team Pager: [phone] (8a-5p)

## 2017-07-09 NOTE — Clinical Social Work Note (Signed)
Clinical Social Work Assessment  Patient Details  Name: Abigail Boyd MRN: [id number] Date of Birth: 10/13/1929  Date of referral:  07/09/17               Reason for consult:  Facility Placement                Permission sought to share information with:  Case Manager, Customer service manager, Family Supports Permission granted to share information::  Yes, Verbal Permission Granted  Name::      SNF  Agency::   [place]   Relationship::     Contact Information:     Housing/Transportation Living arrangements for the past 2 months:  [place] of Information:  Patient, Adult Children Patient Interpreter Needed:  None Criminal Activity/Legal Involvement Pertinent to Current Situation/Hospitalization:  No - Comment as needed Significant Relationships:  Adult Children Lives with:  Adult Children Do you feel safe going back to the place where you live?  Yes Need for family participation in patient care:  Yes (Comment)  Care giving concerns:  Patient lives with her daughter and has sitters during the day from 11-5.    Social Worker assessment / plan:  CSW notified by MD that patient and daughter want her to go to SNF today. Per PT recommendation patient can go home with home health. CSW contacted daughter Abigail Boyd [phone] and explained the PT recommendation and that patient's insurance would not approve SNF because of her progress with PT and patient was medically stable for discharge today and could not stay another night. Daughter continued to call nurse and explain that she does not feel comfortable taking patient home. CSW spoke with patient and she does not feel safe returning home because of weakness. CSW contacted PT to have another evaluation done. PT assessed and reports that patient has declined since original evaluation and could benefit from SNF.  CSW initiated bed search. Patient and daughter are in agreement with this and would like to go to  [facility]. CSW contacted [facility] and they are not in network with patient's insurance. CSW notified patient's daughter of above and they do not want to pay privately for bed at [facility]. CSW informed daughter that [facility] could accept patient with her insurance today. Patient and her family have accepted a bed at [facility].   CSW notified Claiborne Billings at [facility] and notified her of bed acceptance. Patient will discharge to [facility] today. Claiborne Billings at [facility] states that they will get [facility] authorization and patient is okay to come today. CSW notified nurse and patient daughter. RN to call report and call for EMS transport. RN will also call daughter when EMS gets here for transport.   Employment status:  Retired Nurse, adult PT Recommendations:  [place] / Referral to community resources:  [place]  Patient/Family's Response to care:  Patient wants to go to SNF   Patient/Family's Understanding of and Emotional Response to Diagnosis, Current Treatment, and Prognosis:  Daughter is aware of prognosis and would like patient to go to SNF at discharge.   Emotional Assessment Appearance:  Appears stated age Attitude/Demeanor/Rapport:  Apprehensive Affect (typically observed):  Anxious Orientation:  Oriented to Self, Oriented to Place, Oriented to  Time, Oriented to Situation Alcohol / Substance use:  Not Applicable Psych involvement (Current and /or in the community):  No (Comment)  Discharge Needs  Concerns to  be addressed:  Discharge Planning Concerns Readmission within the last 30 days:  No Current discharge risk:  Dependent with Mobility Barriers to Discharge:  No Barriers Identified   Sissy Hoff 07/09/2017, 5:04 PM

## 2017-07-09 NOTE — NC FL2 (Signed)
[place] LEVEL OF CARE SCREENING TOOL     IDENTIFICATION  Patient Name: Abigail Boyd Birthdate: 03/29/1929 Sex: female Admission Date (Current Location): 07/05/2017  [place] and [place] Number:  Engineering geologist and Address:  [facility], [address], [place], [place] [address]      Provider Number: [id number]  Attending Physician Name and Address:  Epifanio Lesches, MD  Relative Name and Phone Number:       Current Level of Care: Hospital Recommended Level of Care: [place] Prior Approval Number:    Date Approved/Denied:   PASRR Number: [id number]  Discharge Plan: SNF    Current Diagnoses: Patient Active Problem List   Diagnosis Date Noted  . UTI (urinary tract infection) 07/05/2017  . Acute lower UTI 04/23/2017  . Generalized weakness 04/23/2017  . Recurrent UTI 04/23/2017  . Dysuria 12/12/2016  . Atypical chest pain 12/12/2016  . Anxiety 12/13/2015  . Allergic rhinitis 12/13/2015  . Shortness of breath 11/18/2015  . Orthostatic hypotension 05/30/2015  . History of cardioembolic cerebrovascular accident (CVA) 05/24/2015  . Syncope 04/23/2015  . Urge incontinence 03/15/2015  . Bilateral lower extremity edema 03/15/2015  . Constipation 03/15/2015  . Right knee pain 03/15/2015  . Hematochezia 03/15/2015  . CKD (chronic kidney disease), stage II   . Osteoporosis screening 06/10/2014  . PLMD (periodic limb movement disorder) 06/12/2013  . Obstructive sleep apnea 03/05/2013  . Tachy-brady syndrome: Rates range from 50-169 bpm in A. fib 12/20/2012  . History of syncope: Unclear etiology 12/19/2012  . Chronotropic incompetence - partially medication related 08/18/2012  . PAF (paroxysmal atrial fibrillation) (HCC);  CHA2DS2-VASc Score =6 on 15 mg Xarelto 05/16/2012    Class: Diagnosis of  . Long term current use of anticoagulant therapy 05/16/2012  . CAD-PCI OM1, 100% RCA (L-R colllaterals)  07/07/2011  . Hypercholesteremia     Class: Diagnosis of  . Essential hypertension     Class: Diagnosis of  . Spinal stenosis     Class: History of  . Diabetes mellitus (HCC)     Class: Diagnosis of  . Unstable angina - history of; resolved, status post PCI of the OM 06/13/2011    Class: History of  . Benign endometrial hyperplasia 03/21/2011  . Diverticulitis 03/21/2011  . GERD (gastroesophageal reflux disease) 03/21/2011  . Glaucoma (increased eye pressure) 03/21/2011  . Goiter 03/21/2011  . Hemorrhoid 03/21/2011  . History of anemia 03/21/2011  . History of carpal tunnel syndrome 03/21/2011  . Osteopenia 03/21/2011  . Vitamin D deficiency disease 03/21/2011    Orientation RESPIRATION BLADDER Height & Weight     Self, Time, Situation, Place  Normal External catheter Weight: 183 lb 4.8 oz (83.1 kg) Height:  5\' 4"  (162.6 cm)  BEHAVIORAL SYMPTOMS/MOOD NEUROLOGICAL BOWEL NUTRITION STATUS  (none) (none) Continent Diet(Heart Healthy )  AMBULATORY STATUS COMMUNICATION OF NEEDS Skin   Limited Assist Verbally Normal                       Personal Care Assistance Level of Assistance  Bathing, Feeding, Dressing Bathing Assistance: Limited assistance Feeding assistance: Independent Dressing Assistance: Limited assistance     Functional Limitations Info  Sight, Hearing, Speech Sight Info: Adequate Hearing Info: Adequate Speech Info: Adequate    SPECIAL CARE FACTORS FREQUENCY  PT (By licensed PT), OT (By licensed OT)                    Contractures  Contractures Info: Not present    Additional Factors Info  Code Status, Allergies Code Status Info: DNR Allergies Info: Amlodipine, Clopidogrel Bisulfate, Kenalog Triamcinolone Acetonide, Sulfa Antibiotics, Lisinopril, Losartan            Current Medications (07/09/2017):  This is the current hospital active medication list Current Facility-Administered Medications  Medication Dose Route Frequency Provider  Last Rate Last Dose  . acetaminophen (TYLENOL) tablet 650 mg  650 mg Oral Q6H PRN Saundra Shelling, MD   650 mg at 07/06/17 1549   Or  . acetaminophen (TYLENOL) suppository 650 mg  650 mg Rectal Q6H PRN Saundra Shelling, MD      . ALPRAZolam Duanne Moron) tablet 1 mg  1 mg Oral QHS PRN Saundra Shelling, MD   1 mg at 07/08/17 2141  . ciprofloxacin (CIPRO) tablet 500 mg  500 mg Oral BID Idelle Crouch, MD   500 mg at 07/09/17 0846  . docusate sodium (COLACE) capsule 100 mg  100 mg Oral QHS Saundra Shelling, MD   100 mg at 07/08/17 2141  . enoxaparin (LOVENOX) injection 40 mg  40 mg Subcutaneous Q24H Saundra Shelling, MD   40 mg at 07/08/17 2143  . gabapentin (NEURONTIN) capsule 300 mg  300 mg Oral TID Saundra Shelling, MD   300 mg at 07/09/17 0905  . insulin aspart (novoLOG) injection 0-15 Units  0-15 Units Subcutaneous TID WC Saundra Shelling, MD   8 Units at 07/09/17 1237  . insulin aspart (novoLOG) injection 0-5 Units  0-5 Units Subcutaneous QHS Saundra Shelling, MD   3 Units at 07/08/17 2143  . insulin glargine (LANTUS) injection 15 Units  15 Units Subcutaneous BID Henreitta Leber, MD   15 Units at 07/09/17 2951  . ipratropium (ATROVENT) 0.06 % nasal spray 1 spray  1 spray Nasal Daily Saundra Shelling, MD   1 spray at 07/09/17 0907  . latanoprost (XALATAN) 0.005 % ophthalmic solution 1 drop  1 drop Both Eyes QHS Pyreddy, Reatha Harps, MD   1 drop at 07/08/17 2143  . linagliptin (TRADJENTA) tablet 5 mg  5 mg Oral Daily Pyreddy, Reatha Harps, MD   5 mg at 07/09/17 0905  . loratadine (CLARITIN) tablet 10 mg  10 mg Oral Daily Pyreddy, Reatha Harps, MD   10 mg at 07/09/17 0904  . magnesium oxide (MAG-OX) tablet 800 mg  800 mg Oral QHS Pyreddy, Reatha Harps, MD   800 mg at 07/08/17 2141  . metoprolol tartrate (LOPRESSOR) tablet 25 mg  25 mg Oral BID Saundra Shelling, MD   25 mg at 07/09/17 0905  . mirabegron ER (MYRBETRIQ) tablet 25 mg  25 mg Oral Daily Saundra Shelling, MD   25 mg at 07/09/17 0905  . nitroGLYCERIN (NITROSTAT) SL tablet 0.4 mg  0.4 mg  Sublingual Q5 min PRN Pyreddy, Reatha Harps, MD      . nystatin cream (MYCOSTATIN) 1 application  1 application Topical Daily Saundra Shelling, MD   1 application at 88/41/66 0906  . omega-3 acid ethyl esters (LOVAZA) capsule 1 g  1 g Oral Daily Pyreddy, Reatha Harps, MD   1 g at 07/09/17 0904  . ondansetron (ZOFRAN) tablet 4 mg  4 mg Oral Q6H PRN Pyreddy, Reatha Harps, MD       Or  . ondansetron (ZOFRAN) injection 4 mg  4 mg Intravenous Q6H PRN Pyreddy, Pavan, MD      . pantoprazole (PROTONIX) EC tablet 40 mg  40 mg Oral Daily Pyreddy, Reatha Harps, MD   40 mg at 07/09/17 0904  . PARoxetine (  PAXIL) tablet 10 mg  10 mg Oral Daily Saundra Shelling, MD   10 mg at 07/09/17 0905  . polyethylene glycol (MIRALAX / GLYCOLAX) packet 17 g  17 g Oral Daily PRN Saundra Shelling, MD   17 g at 07/09/17 0943  . rOPINIRole (REQUIP) tablet 0.5 mg  0.5 mg Oral QHS Pyreddy, Reatha Harps, MD   0.5 mg at 07/08/17 2142  . rosuvastatin (CRESTOR) tablet 40 mg  40 mg Oral Daily Pyreddy, Reatha Harps, MD   40 mg at 07/09/17 0904  . traMADol (ULTRAM) tablet 50 mg  50 mg Oral BID Saundra Shelling, MD   50 mg at 07/09/17 6861     Discharge Medications: Please see discharge summary for a list of discharge medications.  Relevant Imaging Results:  Relevant Lab Results:   Additional Information SSN [id number]  Annamaria Boots, [place]

## 2017-07-09 NOTE — Clinical Social Work Note (Signed)
CSW alerted by nurse and MD that patient's daughter would like her to go to SNF at discharge and not home with home health. CSW called patient's daughter Loreli Slot [phone] and explained that her insurance will not pay for patient to go to SNF because PT recommendation is home with home health. Patient's daughter is concerned about patient going home but CSW explained that the only way for patient to go to SNF would be if they pay out of pocket. Family does not want to pay out of pocket for SNF and will take patient home when ready for DC. Daughter also stated that she will begin trying to set up sitters for patient at home.   Annamaria Boots MSW, [place] [phone]

## 2017-07-09 NOTE — Evaluation (Signed)
Physical Therapy Evaluation Patient Details Name: Abigail Boyd MRN: [id number] DOB: 1929/10/02 Today's Date: 07/09/2017   History of Present Illness  presented to ER secondary to progressive weakness; admitted for mangement of UTI.  Clinical Impression  Upon evaluation, patient alert and oriented; follows commands and demonstrates fair effort with mobility re-assessment this date.  Affect generally flat and discouraged, requiring consistent cuing/encouragement for full, active participation with treatment session.  Bilat UE/LE generally weak and deconditioned due to extended hospitalization.  Requiring mod assist for bed mobility, mod assist for sit/stand and min/mod assist for gait (15') with RW.  Very slow and effortful; assist required for walker position and advancement, weight shift and overall standing balance.  Fatigues quickly and demonstrates significant deficit in functional activity tolerance.  Question ability to fully, safely complete mobility tasks necessary to facilitate safe discharge home at this time. Would benefit from skilled PT to address above deficits and promote optimal return to PLOF; recommend transition to STR upon discharge from acute hospitalization.  Camera operator, Therapist, sports, CSW informed/aware of change in recommendation due to decline in patient's functional status.     Follow Up Recommendations SNF    Equipment Recommendations       Recommendations for Other Services       Precautions / Restrictions Precautions Precautions: Fall Restrictions Weight Bearing Restrictions: No      Mobility  Bed Mobility Overal bed mobility: Needs Assistance Bed Mobility: Supine to Sit     Supine to sit: Mod assist     General bed mobility comments: assist for LE management and truncal elevation; heavy use of UEs to pull trunk up from bed  Transfers Overall transfer level: Needs assistance Equipment used: Rolling walker (2 wheeled) Transfers: Sit to/from  Stand Sit to Stand: Mod assist         General transfer comment: cuing for hand placement; assist for lift off, standing balance and postural extension. Unable to reach full postural extension  Ambulation/Gait Ambulation/Gait assistance: Min assist;Mod assist Ambulation Distance (Feet): 15 Feet Assistive device: Rolling walker (2 wheeled)       General Gait Details: forward flexed posture with very short, shuffling steps; very slow and deliberate.  Poor balance, poor functional endurance.  Significant increase in time required to complete  Stairs            Wheelchair Mobility    Modified Rankin (Stroke Patients Only)       Balance Overall balance assessment: Needs assistance Sitting-balance support: No upper extremity supported;Feet supported Sitting balance-Leahy Scale: Good     Standing balance support: Bilateral upper extremity supported Standing balance-Leahy Scale: Fair                               Pertinent Vitals/Pain Pain Assessment: Faces Faces Pain Scale: Hurts even more Pain Location: R knee Pain Descriptors / Indicators: Aching;Grimacing Pain Intervention(s): Limited activity within patient's tolerance;Monitored during session;Repositioned    Home Living Family/patient expects to be discharged to:: Private residence Living Arrangements: Children Available Help at Discharge: Family;Personal care attendant;Available PRN/intermittently Type of Home: House Home Access: Stairs to enter Entrance Stairs-Rails: Right Entrance Stairs-Number of Steps: 2 Home Layout: Two level;Able to live on main level with bedroom/bathroom Home Equipment: Toilet riser;Other (comment);Walker - 2 wheels Additional Comments: Lives with daughter.  Has personal care attendant 11am-5pm each day; additional aide 2x/week for assist with ADLs.  Daughter goes to work during the day.  Family/caregiver  available 24/7 per patient rpeort    Prior Function Level of  Independence: Needs assistance   Gait / Transfers Assistance Needed: Pt was working with HHPT PTA.  Pt able to ambulate short household distances with RW.  No falls in the past 6 months.   ADL's / Homemaking Assistance Needed: Pt sleeps in lift chair.  Her caregiver assists with sponge baths and dressing.  Family and caregiver do the cooking, cleaning.    Comments: Sup for basic transfers and household distances with RW; community mobilization for hair and MD appointments only (tends to utilize [facility] for longer distances).  Denies fall history.     Hand Dominance   Dominant Hand: Right    Extremity/Trunk Assessment   Upper Extremity Assessment Upper Extremity Assessment: Overall WFL for tasks assessed    Lower Extremity Assessment Lower Extremity Assessment: Generalized weakness(grossly 3-/5 thoughout; unable to complete full R knee extension (due to chronic arthritic pain))       Communication   Communication: No difficulties  Cognition Arousal/Alertness: Awake/alert Behavior During Therapy: Flat affect Overall Cognitive Status: Within Functional Limits for tasks assessed                                 General Comments: depressed affect      General Comments      Exercises Other Exercises Other Exercises: Sit/stand x3 reps, mod assist-consistent cuing for hand placement, lift off, standing balance and postural extension.  Fatigues quickly; constant verbal encouragement for task completion.   Assessment/Plan    PT Assessment Patient needs continued PT services  PT Problem List Decreased strength;Decreased range of motion;Decreased activity tolerance;Decreased knowledge of use of DME;Decreased balance;Decreased mobility;Decreased knowledge of precautions;Decreased safety awareness       PT Treatment Interventions DME instruction;Gait training;Stair training;Functional mobility training;Therapeutic activities;Therapeutic exercise;Balance  training;Patient/family education    PT Goals (Current goals can be found in the Care Plan section)  Acute Rehab PT Goals Patient Stated Goal: to go to a rest home so I can get stronger PT Goal Formulation: With patient Time For Goal Achievement: 07/20/17 Potential to Achieve Goals: Fair    Frequency Min 2X/week   Barriers to discharge Decreased caregiver support      Co-evaluation               AM-PAC PT "6 Clicks" Daily Activity  Outcome Measure Difficulty turning over in bed (including adjusting bedclothes, sheets and blankets)?: Unable Difficulty moving from lying on back to sitting on the side of the bed? : Unable Difficulty sitting down on and standing up from a chair with arms (e.g., wheelchair, bedside commode, etc,.)?: Unable Help needed moving to and from a bed to chair (including a wheelchair)?: A Lot Help needed walking in hospital room?: A Lot Help needed climbing 3-5 steps with a railing? : Total 6 Click Score: 8    End of Session Equipment Utilized During Treatment: Gait belt Activity Tolerance: Patient limited by fatigue;Patient limited by pain Patient left: in chair;with chair alarm set;with call bell/phone within reach Nurse Communication: Mobility status PT Visit Diagnosis: Difficulty in walking, not elsewhere classified (R26.2);Muscle weakness (generalized) (M62.81);Pain Pain - Right/Left: Right Pain - part of body: Knee    Time: 9485-4627 PT Time Calculation (min) (ACUTE ONLY): 29 min   Charges:   PT Evaluation $PT Re-evaluation: 1 Re-eval PT Treatments $Therapeutic Activity: 23-37 mins   PT G Codes:  Lahela Woodin H. Owens Shark, PT, DPT, NCS 07/09/17, 4:42 PM [phone]

## 2017-07-09 NOTE — NC FL2 (Signed)
[place] LEVEL OF CARE SCREENING TOOL     IDENTIFICATION  Patient Name: Abigail Boyd Birthdate: 21-May-1929 Sex: female Admission Date (Current Location): 07/05/2017  [place] and [place] Number:  Engineering geologist and Address:  [facility], [address], [place], [place] [address]      Provider Number: [id number]  Attending Physician Name and Address:  Epifanio Lesches, MD  Relative Name and Phone Number:       Current Level of Care: Hospital Recommended Level of Care: [place] Prior Approval Number:    Date Approved/Denied:   PASRR Number: [id number]  Discharge Plan: SNF    Current Diagnoses: Patient Active Problem List   Diagnosis Date Noted  . UTI (urinary tract infection) 07/05/2017  . Acute lower UTI 04/23/2017  . Generalized weakness 04/23/2017  . Recurrent UTI 04/23/2017  . Dysuria 12/12/2016  . Atypical chest pain 12/12/2016  . Anxiety 12/13/2015  . Allergic rhinitis 12/13/2015  . Shortness of breath 11/18/2015  . Orthostatic hypotension 05/30/2015  . History of cardioembolic cerebrovascular accident (CVA) 05/24/2015  . Syncope 04/23/2015  . Urge incontinence 03/15/2015  . Bilateral lower extremity edema 03/15/2015  . Constipation 03/15/2015  . Right knee pain 03/15/2015  . Hematochezia 03/15/2015  . CKD (chronic kidney disease), stage II   . Osteoporosis screening 06/10/2014  . PLMD (periodic limb movement disorder) 06/12/2013  . Obstructive sleep apnea 03/05/2013  . Tachy-brady syndrome: Rates range from 50-169 bpm in A. fib 12/20/2012  . History of syncope: Unclear etiology 12/19/2012  . Chronotropic incompetence - partially medication related 08/18/2012  . PAF (paroxysmal atrial fibrillation) (HCC);  CHA2DS2-VASc Score =6 on 15 mg Xarelto 05/16/2012    Class: Diagnosis of  . Long term current use of anticoagulant therapy 05/16/2012  . CAD-PCI OM1, 100% RCA (L-R colllaterals)  07/07/2011  . Hypercholesteremia     Class: Diagnosis of  . Essential hypertension     Class: Diagnosis of  . Spinal stenosis     Class: History of  . Diabetes mellitus (HCC)     Class: Diagnosis of  . Unstable angina - history of; resolved, status post PCI of the OM 06/13/2011    Class: History of  . Benign endometrial hyperplasia 03/21/2011  . Diverticulitis 03/21/2011  . GERD (gastroesophageal reflux disease) 03/21/2011  . Glaucoma (increased eye pressure) 03/21/2011  . Goiter 03/21/2011  . Hemorrhoid 03/21/2011  . History of anemia 03/21/2011  . History of carpal tunnel syndrome 03/21/2011  . Osteopenia 03/21/2011  . Vitamin D deficiency disease 03/21/2011    Orientation RESPIRATION BLADDER Height & Weight     Self, Time, Situation, Place  Normal External catheter Weight: 183 lb 4.8 oz (83.1 kg) Height:  5\' 4"  (162.6 cm)  BEHAVIORAL SYMPTOMS/MOOD NEUROLOGICAL BOWEL NUTRITION STATUS  (none) (none) Continent Diet(Heart Healthy )  AMBULATORY STATUS COMMUNICATION OF NEEDS Skin   Limited Assist Verbally Normal                       Personal Care Assistance Level of Assistance  Bathing, Feeding, Dressing Bathing Assistance: Limited assistance Feeding assistance: Independent Dressing Assistance: Limited assistance     Functional Limitations Info  Sight, Hearing, Speech Sight Info: Adequate Hearing Info: Adequate Speech Info: Adequate    SPECIAL CARE FACTORS FREQUENCY  PT (By licensed PT), OT (By licensed OT)                    Contractures  Contractures Info: Not present    Additional Factors Info  Code Status, Allergies Code Status Info: DNR Allergies Info: Amlodipine, Clopidogrel Bisulfate, Kenalog Triamcinolone Acetonide, Sulfa Antibiotics, Lisinopril, Losartan            Current Medications (07/09/2017):  This is the current hospital active medication list Current Facility-Administered Medications  Medication Dose Route Frequency Provider  Last Rate Last Dose  . acetaminophen (TYLENOL) tablet 650 mg  650 mg Oral Q6H PRN Saundra Shelling, MD   650 mg at 07/06/17 1549   Or  . acetaminophen (TYLENOL) suppository 650 mg  650 mg Rectal Q6H PRN Saundra Shelling, MD      . ALPRAZolam Duanne Moron) tablet 1 mg  1 mg Oral QHS PRN Saundra Shelling, MD   1 mg at 07/08/17 2141  . ciprofloxacin (CIPRO) tablet 500 mg  500 mg Oral BID Idelle Crouch, MD   500 mg at 07/09/17 0846  . docusate sodium (COLACE) capsule 100 mg  100 mg Oral QHS Saundra Shelling, MD   100 mg at 07/08/17 2141  . enoxaparin (LOVENOX) injection 40 mg  40 mg Subcutaneous Q24H Saundra Shelling, MD   40 mg at 07/08/17 2143  . gabapentin (NEURONTIN) capsule 300 mg  300 mg Oral TID Saundra Shelling, MD   300 mg at 07/09/17 0905  . insulin aspart (novoLOG) injection 0-15 Units  0-15 Units Subcutaneous TID WC Saundra Shelling, MD   8 Units at 07/09/17 1237  . insulin aspart (novoLOG) injection 0-5 Units  0-5 Units Subcutaneous QHS Saundra Shelling, MD   3 Units at 07/08/17 2143  . insulin glargine (LANTUS) injection 15 Units  15 Units Subcutaneous BID Henreitta Leber, MD   15 Units at 07/09/17 4401  . ipratropium (ATROVENT) 0.06 % nasal spray 1 spray  1 spray Nasal Daily Saundra Shelling, MD   1 spray at 07/09/17 0907  . latanoprost (XALATAN) 0.005 % ophthalmic solution 1 drop  1 drop Both Eyes QHS Pyreddy, Reatha Harps, MD   1 drop at 07/08/17 2143  . linagliptin (TRADJENTA) tablet 5 mg  5 mg Oral Daily Pyreddy, Reatha Harps, MD   5 mg at 07/09/17 0905  . loratadine (CLARITIN) tablet 10 mg  10 mg Oral Daily Pyreddy, Reatha Harps, MD   10 mg at 07/09/17 0904  . magnesium oxide (MAG-OX) tablet 800 mg  800 mg Oral QHS Pyreddy, Reatha Harps, MD   800 mg at 07/08/17 2141  . metoprolol tartrate (LOPRESSOR) tablet 25 mg  25 mg Oral BID Saundra Shelling, MD   25 mg at 07/09/17 0905  . mirabegron ER (MYRBETRIQ) tablet 25 mg  25 mg Oral Daily Saundra Shelling, MD   25 mg at 07/09/17 0905  . nitroGLYCERIN (NITROSTAT) SL tablet 0.4 mg  0.4 mg  Sublingual Q5 min PRN Pyreddy, Reatha Harps, MD      . nystatin cream (MYCOSTATIN) 1 application  1 application Topical Daily Saundra Shelling, MD   1 application at 02/72/53 0906  . omega-3 acid ethyl esters (LOVAZA) capsule 1 g  1 g Oral Daily Pyreddy, Reatha Harps, MD   1 g at 07/09/17 0904  . ondansetron (ZOFRAN) tablet 4 mg  4 mg Oral Q6H PRN Pyreddy, Reatha Harps, MD       Or  . ondansetron (ZOFRAN) injection 4 mg  4 mg Intravenous Q6H PRN Pyreddy, Pavan, MD      . pantoprazole (PROTONIX) EC tablet 40 mg  40 mg Oral Daily Pyreddy, Reatha Harps, MD   40 mg at 07/09/17 0904  . PARoxetine (  PAXIL) tablet 10 mg  10 mg Oral Daily Saundra Shelling, MD   10 mg at 07/09/17 0905  . polyethylene glycol (MIRALAX / GLYCOLAX) packet 17 g  17 g Oral Daily PRN Saundra Shelling, MD   17 g at 07/09/17 0943  . rOPINIRole (REQUIP) tablet 0.5 mg  0.5 mg Oral QHS Pyreddy, Reatha Harps, MD   0.5 mg at 07/08/17 2142  . rosuvastatin (CRESTOR) tablet 40 mg  40 mg Oral Daily Pyreddy, Reatha Harps, MD   40 mg at 07/09/17 0904  . traMADol (ULTRAM) tablet 50 mg  50 mg Oral BID Saundra Shelling, MD   50 mg at 07/09/17 5974     Discharge Medications: Please see discharge summary for a list of discharge medications.  Relevant Imaging Results:  Relevant Lab Results:   Additional Information SSN [id number]  Annamaria Boots, [place]

## 2017-07-09 NOTE — Progress Notes (Signed)
Patient transported via EMS.  Daughter contacted via phone.  Clarise Cruz, RN

## 2017-07-11 DIAGNOSIS — R531 Weakness: Secondary | ICD-10-CM | POA: Diagnosis not present

## 2017-07-11 DIAGNOSIS — E785 Hyperlipidemia, unspecified: Secondary | ICD-10-CM | POA: Diagnosis not present

## 2017-07-11 DIAGNOSIS — N390 Urinary tract infection, site not specified: Secondary | ICD-10-CM | POA: Diagnosis not present

## 2017-07-11 DIAGNOSIS — E1169 Type 2 diabetes mellitus with other specified complication: Secondary | ICD-10-CM | POA: Diagnosis not present

## 2017-07-13 ENCOUNTER — Other Ambulatory Visit: Payer: Self-pay

## 2017-07-13 NOTE — Patient Outreach (Signed)
[facility]) Care Management  07/13/2017  Abigail Boyd November 12, 1929 [id number]   EMMI- General Discharge RED ON EMMI ALERT Day # 1  Date: 07/12/17 Red Alert Reason:   Outreach attempt # 1 Spoke with daughter [name].  She is able to verify HIPAA.  Addressed red alert with her.  She reports she answered the call for patient and that she answered because she did not receive the discharge papers because they went to the facility.  She reports that her mom is at [facility] right now and doing ok. She is not sure of discharge as she does not feel comfortable caring for patient presently.  She offers no questions or concerns at this time.  She is agreeable to receive letter and brochure for future reference.     Plan: RN CM will send letter and close case.    Jone Baseman, RN, MSN [facility] Care Management Care Management Coordinator Direct Line [phone] Toll Free: [phone]  Fax: [phone]

## 2017-07-21 ENCOUNTER — Encounter (HOSPITAL_COMMUNITY): Payer: Self-pay | Admitting: Radiology

## 2017-07-21 ENCOUNTER — Other Ambulatory Visit: Payer: Self-pay

## 2017-07-21 ENCOUNTER — Emergency Department (HOSPITAL_COMMUNITY): Payer: Medicare Other

## 2017-07-21 ENCOUNTER — Inpatient Hospital Stay (HOSPITAL_COMMUNITY)
Admission: EM | Admit: 2017-07-21 | Discharge: 2017-07-25 | DRG: 064 | Disposition: A | Discharge status: Skilled Nursing Facility | Payer: Medicare Other | Attending: Internal Medicine | Admitting: Internal Medicine

## 2017-07-21 DIAGNOSIS — E785 Hyperlipidemia, unspecified: Secondary | ICD-10-CM | POA: Diagnosis present

## 2017-07-21 DIAGNOSIS — E669 Obesity, unspecified: Secondary | ICD-10-CM | POA: Diagnosis present

## 2017-07-21 DIAGNOSIS — E1122 Type 2 diabetes mellitus with diabetic chronic kidney disease: Secondary | ICD-10-CM | POA: Diagnosis present

## 2017-07-21 DIAGNOSIS — Z8673 Personal history of transient ischemic attack (TIA), and cerebral infarction without residual deficits: Secondary | ICD-10-CM

## 2017-07-21 DIAGNOSIS — Z955 Presence of coronary angioplasty implant and graft: Secondary | ICD-10-CM

## 2017-07-21 DIAGNOSIS — Z6830 Body mass index (BMI) 30.0-30.9, adult: Secondary | ICD-10-CM

## 2017-07-21 DIAGNOSIS — R51 Headache: Secondary | ICD-10-CM | POA: Diagnosis not present

## 2017-07-21 DIAGNOSIS — I69351 Hemiplegia and hemiparesis following cerebral infarction affecting right dominant side: Secondary | ICD-10-CM | POA: Diagnosis not present

## 2017-07-21 DIAGNOSIS — I708 Atherosclerosis of other arteries: Secondary | ICD-10-CM | POA: Diagnosis not present

## 2017-07-21 DIAGNOSIS — G9340 Encephalopathy, unspecified: Secondary | ICD-10-CM | POA: Diagnosis present

## 2017-07-21 DIAGNOSIS — E1165 Type 2 diabetes mellitus with hyperglycemia: Secondary | ICD-10-CM | POA: Diagnosis present

## 2017-07-21 DIAGNOSIS — Z90710 Acquired absence of both cervix and uterus: Secondary | ICD-10-CM

## 2017-07-21 DIAGNOSIS — I480 Paroxysmal atrial fibrillation: Secondary | ICD-10-CM | POA: Diagnosis not present

## 2017-07-21 DIAGNOSIS — Z823 Family history of stroke: Secondary | ICD-10-CM

## 2017-07-21 DIAGNOSIS — Z794 Long term (current) use of insulin: Secondary | ICD-10-CM

## 2017-07-21 DIAGNOSIS — F419 Anxiety disorder, unspecified: Secondary | ICD-10-CM | POA: Diagnosis present

## 2017-07-21 DIAGNOSIS — I6381 Other cerebral infarction due to occlusion or stenosis of small artery: Secondary | ICD-10-CM | POA: Diagnosis not present

## 2017-07-21 DIAGNOSIS — R29810 Facial weakness: Secondary | ICD-10-CM | POA: Diagnosis not present

## 2017-07-21 DIAGNOSIS — Z9861 Coronary angioplasty status: Secondary | ICD-10-CM

## 2017-07-21 DIAGNOSIS — H409 Unspecified glaucoma: Secondary | ICD-10-CM | POA: Diagnosis present

## 2017-07-21 DIAGNOSIS — E119 Type 2 diabetes mellitus without complications: Secondary | ICD-10-CM

## 2017-07-21 DIAGNOSIS — R29709 NIHSS score 9: Secondary | ICD-10-CM | POA: Diagnosis not present

## 2017-07-21 DIAGNOSIS — I2510 Atherosclerotic heart disease of native coronary artery without angina pectoris: Secondary | ICD-10-CM

## 2017-07-21 DIAGNOSIS — Z7901 Long term (current) use of anticoagulants: Secondary | ICD-10-CM | POA: Diagnosis not present

## 2017-07-21 DIAGNOSIS — I639 Cerebral infarction, unspecified: Secondary | ICD-10-CM | POA: Diagnosis not present

## 2017-07-21 DIAGNOSIS — F329 Major depressive disorder, single episode, unspecified: Secondary | ICD-10-CM | POA: Diagnosis present

## 2017-07-21 DIAGNOSIS — N182 Chronic kidney disease, stage 2 (mild): Secondary | ICD-10-CM | POA: Diagnosis present

## 2017-07-21 DIAGNOSIS — Z66 Do not resuscitate: Secondary | ICD-10-CM | POA: Diagnosis present

## 2017-07-21 DIAGNOSIS — R531 Weakness: Secondary | ICD-10-CM | POA: Diagnosis not present

## 2017-07-21 DIAGNOSIS — I130 Hypertensive heart and chronic kidney disease with heart failure and stage 1 through stage 4 chronic kidney disease, or unspecified chronic kidney disease: Secondary | ICD-10-CM | POA: Diagnosis present

## 2017-07-21 DIAGNOSIS — E7800 Pure hypercholesterolemia, unspecified: Secondary | ICD-10-CM | POA: Diagnosis present

## 2017-07-21 DIAGNOSIS — I5032 Chronic diastolic (congestive) heart failure: Secondary | ICD-10-CM | POA: Diagnosis not present

## 2017-07-21 DIAGNOSIS — G4733 Obstructive sleep apnea (adult) (pediatric): Secondary | ICD-10-CM | POA: Diagnosis not present

## 2017-07-21 DIAGNOSIS — Z8249 Family history of ischemic heart disease and other diseases of the circulatory system: Secondary | ICD-10-CM

## 2017-07-21 DIAGNOSIS — K219 Gastro-esophageal reflux disease without esophagitis: Secondary | ICD-10-CM | POA: Diagnosis present

## 2017-07-21 DIAGNOSIS — R4701 Aphasia: Secondary | ICD-10-CM | POA: Diagnosis present

## 2017-07-21 DIAGNOSIS — R479 Unspecified speech disturbances: Secondary | ICD-10-CM | POA: Diagnosis not present

## 2017-07-21 DIAGNOSIS — R4020 Unspecified coma: Secondary | ICD-10-CM | POA: Diagnosis not present

## 2017-07-21 DIAGNOSIS — G9341 Metabolic encephalopathy: Secondary | ICD-10-CM | POA: Diagnosis not present

## 2017-07-21 DIAGNOSIS — I10 Essential (primary) hypertension: Secondary | ICD-10-CM | POA: Diagnosis not present

## 2017-07-21 DIAGNOSIS — M79604 Pain in right leg: Secondary | ICD-10-CM | POA: Diagnosis not present

## 2017-07-21 DIAGNOSIS — N390 Urinary tract infection, site not specified: Secondary | ICD-10-CM | POA: Diagnosis present

## 2017-07-21 DIAGNOSIS — R1310 Dysphagia, unspecified: Secondary | ICD-10-CM | POA: Diagnosis present

## 2017-07-21 DIAGNOSIS — R29818 Other symptoms and signs involving the nervous system: Secondary | ICD-10-CM | POA: Diagnosis not present

## 2017-07-21 LAB — URINALYSIS, ROUTINE W REFLEX MICROSCOPIC
BILIRUBIN URINE: NEGATIVE
Bacteria, UA: NONE SEEN
GLUCOSE, UA: NEGATIVE mg/dL
HGB URINE DIPSTICK: NEGATIVE
Ketones, ur: NEGATIVE mg/dL
NITRITE: NEGATIVE
PH: 8 (ref 5.0–8.0)
Protein, ur: NEGATIVE mg/dL
SPECIFIC GRAVITY, URINE: 1.017 (ref 1.005–1.030)

## 2017-07-21 LAB — I-STAT CHEM 8, ED
BUN: 20 mg/dL (ref 6–20)
Calcium, Ion: 1.3 mmol/L (ref 1.15–1.40)
Chloride: 94 mmol/L — ABNORMAL LOW (ref 101–111)
Creatinine, Ser: 1 mg/dL (ref 0.44–1.00)
Glucose, Bld: 218 mg/dL — ABNORMAL HIGH (ref 65–99)
HEMATOCRIT: 43 % (ref 36.0–46.0)
HEMOGLOBIN: 14.6 g/dL (ref 12.0–15.0)
Potassium: 4.2 mmol/L (ref 3.5–5.1)
SODIUM: 139 mmol/L (ref 135–145)
TCO2: 28 mmol/L (ref 22–32)

## 2017-07-21 LAB — CBG MONITORING, ED
GLUCOSE-CAPILLARY: 207 mg/dL — AB (ref 65–99)
Glucose-Capillary: 183 mg/dL — ABNORMAL HIGH (ref 65–99)

## 2017-07-21 LAB — COMPREHENSIVE METABOLIC PANEL WITH GFR
ALT: 25 U/L (ref 14–54)
AST: 23 U/L (ref 15–41)
Albumin: 3.6 g/dL (ref 3.5–5.0)
Alkaline Phosphatase: 85 U/L (ref 38–126)
Anion gap: 13 (ref 5–15)
BUN: 18 mg/dL (ref 6–20)
CO2: 28 mmol/L (ref 22–32)
Calcium: 9.7 mg/dL (ref 8.9–10.3)
Chloride: 95 mmol/L — ABNORMAL LOW (ref 101–111)
Creatinine, Ser: 1.21 mg/dL — ABNORMAL HIGH (ref 0.44–1.00)
GFR calc Af Amer: 46 mL/min — ABNORMAL LOW
GFR calc non Af Amer: 40 mL/min — ABNORMAL LOW
Glucose, Bld: 221 mg/dL — ABNORMAL HIGH (ref 65–99)
Potassium: 4.2 mmol/L (ref 3.5–5.1)
Sodium: 136 mmol/L (ref 135–145)
Total Bilirubin: 0.4 mg/dL (ref 0.3–1.2)
Total Protein: 7.6 g/dL (ref 6.5–8.1)

## 2017-07-21 LAB — DIFFERENTIAL
Abs Immature Granulocytes: 0.1 K/uL (ref 0.0–0.1)
Basophils Absolute: 0.1 K/uL (ref 0.0–0.1)
Basophils Relative: 1 %
Eosinophils Absolute: 0.2 K/uL (ref 0.0–0.7)
Eosinophils Relative: 2 %
Immature Granulocytes: 1 %
Lymphocytes Relative: 14 %
Lymphs Abs: 1.4 K/uL (ref 0.7–4.0)
Monocytes Absolute: 0.7 K/uL (ref 0.1–1.0)
Monocytes Relative: 7 %
Neutro Abs: 7.6 K/uL (ref 1.7–7.7)
Neutrophils Relative %: 75 %

## 2017-07-21 LAB — CBC
HCT: 43.3 % (ref 36.0–46.0)
Hemoglobin: 13.8 g/dL (ref 12.0–15.0)
MCH: 30.1 pg (ref 26.0–34.0)
MCHC: 31.9 g/dL (ref 30.0–36.0)
MCV: 94.5 fL (ref 78.0–100.0)
Platelets: 323 K/uL (ref 150–400)
RBC: 4.58 MIL/uL (ref 3.87–5.11)
RDW: 13.4 % (ref 11.5–15.5)
WBC: 10.1 K/uL (ref 4.0–10.5)

## 2017-07-21 LAB — I-STAT TROPONIN, ED: Troponin i, poc: 0 ng/mL (ref 0.00–0.08)

## 2017-07-21 LAB — APTT: aPTT: 28 seconds (ref 24–36)

## 2017-07-21 LAB — PROTIME-INR
INR: 1.13
Prothrombin Time: 14.4 seconds (ref 11.4–15.2)

## 2017-07-21 MED ORDER — IOPAMIDOL (ISOVUE-370) INJECTION 76%
50.0000 mL | Freq: Once | INTRAVENOUS | Status: AC | PRN
  Administered 2017-07-21: 50 mL via INTRAVENOUS

## 2017-07-21 NOTE — ED Triage Notes (Signed)
Per ems pt is from  health care center and pt went to therapy at 1:00pm today and came back normal. Pt was seen again later at nurses station and was crying and was given a xanex to calm her down. She could not sp[eak to the nurse. Pt was crying. Last pressure was 161/75. cbg 207 upon arrival. Alert and oriented but emotional and can't speak.

## 2017-07-21 NOTE — ED Provider Notes (Addendum)
Medical screening examination/treatment/procedure(s) were conducted as a shared visit with non-physician practitioner(s) and myself.  I personally evaluated the patient during the encounter.  EKG Interpretation  Date/Time:  Saturday Jul 21 2017 18:17:14 EDT Ventricular Rate:  87 PR Interval:    QRS Duration: 93 QT Interval:  385 QTC Calculation: 464 R Axis:   22 Text Interpretation:  Sinus rhythm Borderline repolarization abnormality agree. no change from previous Confirmed by Charlesetta Shanks [id number]) on 07/21/2017 8:13:25 PM  Reports that the patient had gone to physical therapy and reportedly, the patient's roommate reported that the patient came back in severe pain and tearful.  There is then reports that the patient could not speak normally and was garbled.  Patient*reports she later found out that she had been given Xanax to calm her down.  Patient has been extremely somnolent and poorly responsive.  Daughter reports that she has had problems with chronic right knee pain.  Patient is extremely somnolent.  She only arouses slightly to stimulation.  No respiratory distress.  Heart regular.  Lungs grossly clear.  Abdomen soft and nondistended.  Patient has palpable femoral pulses bilaterally.  Examination of patient's lower extremities shows the right lower extremity to be slightly pale compared to the left.  Foot is cool by comparison.  No Cyanotic or edematous changes.  DP pulses are difficult to palpate on either foot.  Brisk dorsalis pedis on right by Doppler, I cannot find DP with Doppler but posterior tibial artery is brisk on right foot..  Right lower extremity seems slightly shorter than left.  She is wearing a Lidoderm patch on the right knee.  Patient is too somnolent to perform any neurologic exam.  Patient has been seen by neurology and at this time ruled out for acute stroke.  She is profoundly somnolent and this may be due to Xanax.  Plan will be for admission and observation.  She has  notable findings of the lower extremities with appearance of shortening and a report of returning from physical therapy with severe pain.  Will proceed with imaging of the right lower extremity.    Charlesetta Shanks, MD 07/21/17 2017    Charlesetta Shanks, MD 07/21/17 2031

## 2017-07-21 NOTE — ED Notes (Signed)
Patient transported to X-ray 

## 2017-07-21 NOTE — ED Notes (Signed)
Nurse will  Call lab about blue top for heperain blood test.

## 2017-07-21 NOTE — ED Provider Notes (Signed)
[facility] EMERGENCY DEPARTMENT Provider Note   CSN: [id number] Arrival date & time: 07/21/17  1738     History   Chief Complaint Chief Complaint  Patient presents with  . Code Stroke    HPI Abigail Boyd is a 84 y.o. female presenting for possible code stroke.  History provided by daughter, as patient is not speaking. Level V caveat.  Per daughter, patient was at physical therapy at 1:00 this afternoon when she became very upset and started crying.  She was given 2 Ativan.  After this, she was unable to talk, has remained very emotional.  Patient denies recent fevers, chills, chest pain, shortness of breath, nausea, vomiting, abdominal pain, urinary symptoms, abnormal bowel movements.  She denies fall, trauma, or injury.  She is on blood thinners.  Has a history of previous strokes. Daughter reports chronic R leg pain.  Patient was recently treated for UTI, finished antibiotics on 5/23. Currently denies urinary sxs.   HPI  Past Medical History:  Diagnosis Date  . Abnormal nuclear stress test 06/21/2011   Inferolateral reversible defect;--> cardiac cath & PCI of CxOM, occluded RCA with collaterals;; followup Myoview 11/2012: Low risk. Fixed basal inferior artifact normal EF. No ischemia  . Anxiety    When necessary Xanax  . Anxiety   . Asthma   . Bilateral cataracts    Status post stroke or correction  . CAD S/P percutaneous coronary angioplasty 06/2011   s/P PCI to proximal OM1 w/ DES; occluded RCA with bridging and L-R collaterals (medical management)  . Chronic kidney disease (CKD), stage II (mild)    Related to current bladder infections (although diabetes cannot be excluded)  . Chronotropic incompetence with sinus node dysfunction [facility]) October 2013   On CPET test; beta blockers reduced  . Diabetes mellitus type 2, controlled ([place])    On oral medications  . Diverticulitis   . Essential hypertension    Allowing for permissive hypertension to avoid  orthostatic hypotension  . GERD (gastroesophageal reflux disease)    On PPI  . Glaucoma   . History of syncope    Per EP - neurocardiogenic & not Bradycardia related (no PPM)  . History of unstable angina 06/13/2011   Jaw pain awakening from sleep -- Myoview --CATH --> PCI  . Hyperlipidemia with target LDL less than 70    HDL at goal, LDL not at goal, borderline triglycerides. On Crestor 20 mg  . Migraines   . OSA (obstructive sleep apnea)    hx bladder infections  . Osteoarthritis   . PAF (paroxysmal atrial fibrillation) ([place]) 10-11/ 2014   CardioNet Event Monitor: NSR & S Brady -- Rates 50-100; Total A. fib burden 35 hours and 27 minutes. 1296 episodes, longest was 1 hour 29 minutes. Rate ranged from 52-169 beats per minute.  . Seasonal allergies   . Shortness of breath on exertion October 2013   2-D echo: Normal EF>55%, Gr 1DD, mild aortic sclerosis; Evaluated with CPET - peak VO2 97%; Chronotropic Incompetence (submaximal effort)  . Spinal stenosis of lumbar region 11/2011  . Stroke ([place])   . Tachycardia-bradycardia syndrome ([place]) 11/2012  . Urge incontinence     Patient Active Problem List   Diagnosis Date Noted  . UTI (urinary tract infection) 07/05/2017  . Acute lower UTI 04/23/2017  . Generalized weakness 04/23/2017  . Recurrent UTI 04/23/2017  . Dysuria 12/12/2016  . Atypical chest pain 12/12/2016  . Anxiety 12/13/2015  . Allergic rhinitis 12/13/2015  .  Shortness of breath 11/18/2015  . Orthostatic hypotension 05/30/2015  . History of cardioembolic cerebrovascular accident (CVA) 05/24/2015  . Syncope 04/23/2015  . Urge incontinence 03/15/2015  . Bilateral lower extremity edema 03/15/2015  . Constipation 03/15/2015  . Right knee pain 03/15/2015  . Hematochezia 03/15/2015  . CKD (chronic kidney disease), stage II   . Osteoporosis screening 06/10/2014  . PLMD (periodic limb movement disorder) 06/12/2013  . Obstructive sleep apnea 03/05/2013  . Tachy-brady  syndrome: Rates range from 50-169 bpm in A. fib 12/20/2012  . History of syncope: Unclear etiology 12/19/2012  . Chronotropic incompetence - partially medication related 08/18/2012  . PAF (paroxysmal atrial fibrillation) (HCC);  CHA2DS2-VASc Score =6 on 15 mg Xarelto 05/16/2012    Class: Diagnosis of  . Long term current use of anticoagulant therapy 05/16/2012  . CAD-PCI OM1, 100% RCA (L-R colllaterals) 07/07/2011  . Hypercholesteremia     Class: Diagnosis of  . Essential hypertension     Class: Diagnosis of  . Spinal stenosis     Class: History of  . Diabetes mellitus (HCC)     Class: Diagnosis of  . Unstable angina - history of; resolved, status post PCI of the OM 06/13/2011    Class: History of  . Benign endometrial hyperplasia 03/21/2011  . Diverticulitis 03/21/2011  . GERD (gastroesophageal reflux disease) 03/21/2011  . Glaucoma (increased eye pressure) 03/21/2011  . Goiter 03/21/2011  . Hemorrhoid 03/21/2011  . History of anemia 03/21/2011  . History of carpal tunnel syndrome 03/21/2011  . Osteopenia 03/21/2011  . Vitamin D deficiency disease 03/21/2011    Past Surgical History:  Procedure Laterality Date  . APPENDECTOMY    . BREAST BIOPSY     both breast  . cataracts     both eyes  . COLONOSCOPY    . CORONARY ANGIOPLASTY WITH STENT PLACEMENT  07/05/2011   Promus Element DES 2.5 mm x 16 mm-post dilated to 2.65 mm CX-Proximal OM1  . Holter Monitor  04/2015   Sinus rhythm with rates 52-149 BPM. Isolated PACs with rare couplets and bigeminy. Multiple short runs of PAT/PSVT. Arrhythmia run 120 bpm for 204 beats. 14% of time was in A. fib/flutter. - Reviewed by Dr. Caryl Comes. Not thought to be significant enough for her syncope.  Marland Kitchen KNEE ARTHROSCOPY WITH MEDIAL MENISECTOMY Right 06/24/2014   Procedure: RIGHT KNEE ARTHROSCOPY WITH partial lateral MENISECTOMY, abrasion chondroplasty of medial femoral condyle and patella, microfracture technique;  Surgeon: Latanya Maudlin, MD;   Location: [facility];  Service: Orthopedics;  Laterality: Right;  . LEFT HEART CATHETERIZATION WITH CORONARY ANGIOGRAM N/A 07/06/2011   Procedure: LEFT HEART CATHETERIZATION WITH CORONARY ANGIOGRAM;  Surgeon: Leonie Man, MD;  Location: [facility] CATH LAB: Unstable Angina --> Myoview wiht Inf-Lat Ischemia.  Proximal OM1 lesion --> PCI; 100% mid RCA with right to right and left to right bridging collaterals from circumflex RPL and LAD the PDA.  Marland Kitchen NM MYOVIEW LTD  October 2014    Low risk. Fixed basal inferior artifact normal EF. No ischemia --> (as compared to pre-PCI Myoview revealing inferolateral ischemia)  . rotator cuff Right   . TRANSTHORACIC ECHOCARDIOGRAM  11/2014   EF 65-70%, Mod LVH. Normal wall motion. Gr 1 DD. Normal valves.  Marland Kitchen VAGINAL HYSTERECTOMY       OB History   None      Home Medications    Prior to Admission medications   Medication Sig Start Date End Date Taking? Authorizing Provider  ALPRAZolam (XANAX) 0.25 MG tablet TAKE 1  TABLET BY MOUTH AT BEDTIME AS NEEDED SLEEP 04/25/17  Yes Gouru, Aruna, MD  azelastine (ASTELIN) 0.1 % nasal spray Place 1-2 sprays into both nostrils at bedtime. Use in each nostril as directed   Yes [provider]  docusate sodium (STOOL SOFTENER) 100 MG capsule Take 100 mg by mouth at bedtime.    Yes [provider]  estradiol (ESTRACE VAGINAL) 0.1 MG/GM vaginal cream Apply 0.5mg  (pea-sized amount)  just inside the vaginal introitus with a finger-tip on Monday, Wednesday and Friday nights. 06/04/17  Yes McGowan, Larene Beach A, PA-C  fish oil-omega-3 fatty acids 1000 MG capsule Take 1 g by mouth daily.    Yes [provider]  furosemide (LASIX) 20 MG tablet Take 2 tablets (40 mg total) by mouth daily. 04/09/17  Yes Leonie Man, MD  gabapentin (NEURONTIN) 300 MG capsule Take 300 mg by mouth 3 (three) times daily.    Yes [provider]  HUMALOG KWIKPEN 100 UNIT/ML KiwkPen Inject 22 Units into the skin 3 (three) times daily  with meals.  05/28/17  Yes [provider]  Insulin Glargine (TOUJEO MAX SOLOSTAR) 300 UNIT/ML SOPN Inject 85 Units into the skin at bedtime.   Yes [provider]  levocetirizine (XYZAL) 5 MG tablet Take 2.5 mg by mouth every other day.    Yes [provider]  LUMIGAN 0.01 % SOLN Place 1 drop into both eyes at bedtime.    Yes [provider]  Magnesium 500 MG CAPS Take 1,000 mg by mouth at bedtime.    Yes [provider]  metFORMIN (GLUCOPHAGE-XR) 500 MG 24 hr tablet Take 500 mg by mouth daily with breakfast.   Yes [provider]  metoprolol tartrate (LOPRESSOR) 25 MG tablet Take 1 tablet (25 mg total) by mouth 2 (two) times daily. 07/08/17  Yes Epifanio Lesches, MD  mirabegron ER (MYRBETRIQ) 50 MG TB24 tablet Take 50 mg by mouth daily.    Yes [provider]  nitroGLYCERIN (NITROSTAT) 0.4 MG SL tablet Place 0.4 mg under the tongue every 5 (five) minutes as needed for chest pain.   Yes [provider]  nystatin cream (MYCOSTATIN) Apply 1 application topically daily. 12/12/16  Yes Leone Haven, MD  pantoprazole (PROTONIX) 40 MG tablet Take 1 tablet (40 mg total) by mouth daily. 04/05/17  Yes Leone Haven, MD  polyethylene glycol [name] / GLYCOLAX) packet Take 17 g by mouth daily as needed for moderate constipation. 04/25/17  Yes Gouru, Illene Silver, MD  Rivaroxaban (XARELTO) 15 MG TABS tablet Take 15 mg by mouth every evening.   Yes [provider]  rOPINIRole (REQUIP) 0.5 MG tablet TAKE 1 TABLET BY MOUTH AT  BEDTIME 05/30/17  Yes Leone Haven, MD  sitaGLIPtin (JANUVIA) 50 MG tablet Take 50 mg by mouth daily.    Yes [provider]  traMADol (ULTRAM) 50 MG tablet Take 1 tablet (50 mg total) by mouth 2 (two) times daily. 04/25/17  Yes GouruIllene Silver, MD    Family History Family History  Problem Relation Age of Onset  . COPD Mother   . Pulmonary fibrosis Mother   . Heart attack Father   . Heart  disease Father   . Stroke Father   . Cancer Sister   . Cancer Sister   . Clotting disorder Sister   . Heart attack Sister   . Arthritis Unknown   . Breast cancer Sister   . Colon cancer Neg Hx   . Esophageal cancer Neg  Hx   . Stomach cancer Neg Hx     Social History Social History   Tobacco Use  . Smoking status: Never Smoker  . Smokeless tobacco: Never Used  Substance Use Topics  . Alcohol use: No  . Drug use: No     Allergies   Amlodipine; Clopidogrel bisulfate; Kenalog [triamcinolone acetonide]; Lisinopril; Losartan; and Sulfa antibiotics   Review of Systems Review of Systems  Unable to perform ROS: Patient nonverbal  Neurological: Positive for speech difficulty.     Physical Exam Updated Vital Signs BP (!) 154/66   Pulse 79   Temp 97.6 F (36.4 C)   Resp 13   SpO2 96%   Physical Exam  Constitutional: She is oriented to person, place, and time. She appears well-developed and well-nourished.  Elderly patient in NAD  HENT:  Head: Normocephalic and atraumatic.  Eyes: Pupils are equal, round, and reactive to light. Conjunctivae and EOM are normal.  Neck: Normal range of motion. Neck supple.  Cardiovascular: Normal rate, regular rhythm and intact distal pulses.  Pulmonary/Chest: Effort normal and breath sounds normal. No respiratory distress. She has no wheezes.  Abdominal: Soft. She exhibits no distension and no mass. There is no tenderness. There is no guarding.  Musculoskeletal: Normal range of motion.  Moves arms and legs on command.  Residual weakness of the right side from previous strokes, apparently unchanged.  Right foot is cool and pale.  Dopplerable pulse.  No obvious deformity or injury.  Neurological: She is alert and oriented to person, place, and time.  Patient responding with 1-2 word answers, easily tongue-tied and easily frustrated.  Will nod yes to knowing who she is, where she is, and what happened.  Skin: Skin is warm and dry.    Psychiatric: She has a normal mood and affect.  Nursing note and vitals reviewed.    ED Treatments / Results  Labs (all labs ordered are listed, but only abnormal results are displayed) Labs Reviewed  COMPREHENSIVE METABOLIC PANEL - Abnormal; Notable for the following components:      Result Value   Chloride 95 (*)    Glucose, Bld 221 (*)    Creatinine, Ser 1.21 (*)    GFR calc non Af Amer 40 (*)    GFR calc Af Amer 46 (*)    All other components within normal limits  CBG MONITORING, ED - Abnormal; Notable for the following components:   Glucose-Capillary 207 (*)    All other components within normal limits  I-STAT CHEM 8, ED - Abnormal; Notable for the following components:   Chloride 94 (*)    Glucose, Bld 218 (*)    All other components within normal limits  URINE CULTURE  PROTIME-INR  APTT  CBC  DIFFERENTIAL  HEPARIN ANTI-XA  URINALYSIS, ROUTINE W REFLEX MICROSCOPIC  I-STAT TROPONIN, ED    EKG EKG Interpretation  Date/Time:  Saturday Jul 21 2017 18:17:14 EDT Ventricular Rate:  87 PR Interval:    QRS Duration: 93 QT Interval:  385 QTC Calculation: 464 R Axis:   22 Text Interpretation:  Sinus rhythm Borderline repolarization abnormality agree. no change from previous Confirmed by Charlesetta Shanks [phone]) on 07/21/2017 8:13:25 PM   Radiology Ct Angio Head W Or Wo Contrast  Result Date: 07/21/2017 CLINICAL DATA:  New onset right-sided weakness. EXAM: CT ANGIOGRAPHY HEAD AND NECK TECHNIQUE: Multidetector CT imaging of the head and neck was performed using the standard protocol during bolus administration of intravenous contrast. Multiplanar CT image reconstructions and  MIPs were obtained to evaluate the vascular anatomy. Carotid stenosis measurements (when applicable) are obtained utilizing NASCET criteria, using the distal internal carotid diameter as the denominator. CONTRAST:  62mL ISOVUE-370 IOPAMIDOL (ISOVUE-370) INJECTION 76% COMPARISON:  CT head without  contrast 07/21/2017 and 01/20/2017. MRI brain and MRA head 05/18/2015. FINDINGS: CTA NECK FINDINGS Aortic arch: A 3 vessel arch configuration is present. Atherosclerotic calcifications are present origin of the left subclavian artery without a significant stenosis. Right carotid system: The right common carotid artery is within normal limits. Bifurcation is unremarkable. There is tortuosity of the cervical right ICA. Irregularity is present in the mid cervical right ICA without significant stenosis. Left carotid system: The left common carotid artery is within normal limits. The bifurcation unremarkable. There is focal irregularity of the mid left cervical ICA. No significant luminal stenosis is present. Overall caliber of the left internal carotid artery is smaller than the right. Vertebral arteries: The right vertebral artery is slightly dominant to the left. Both vertebral arteries originate from the subclavian arteries without significant stenosis. There is no significant stenosis or focal vessel irregularity within either vertebral artery in the neck. Skeleton: Endplate degenerative changes are evident from C3-4 through C6-7. Osseous foraminal narrowing is greatest on the left at C5-6 and on the right at C4-5 and C6-7. Other neck: Multiple hypodense thyroid nodules are present. These measure less than 10 mm. There is no dominant lesion. No significant cervical adenopathy is present. Salivary glands are within normal limits. No focal mucosal or submucosal lesions are evident. Vocal cords are midline and symmetric. Upper chest: Scattered ground-glass attenuation is present in the lung is bilaterally. No focal airspace consolidation is present. There is no nodule or mass lesion. Review of the MIP images confirms the above findings CTA HEAD FINDINGS Anterior circulation: No atherosclerotic calcifications are present within the cavernous internal carotid arteries bilaterally without significant stenosis. The ICA  termini are within normal limits bilaterally. The A1 and M1 segments are normal. The anterior communicating artery is present. MCA bifurcations are within normal limits. The proximal MCA branch vessels are within normal limits bilaterally. There is some distal attenuation bilaterally. No significant proximal stenosis or occlusion is present. There is moderate focal stenosis within the mid left A2 segment. There is some attenuation of distal ACA branch vessels bilaterally. Posterior circulation: The right vertebral artery is slightly dominant. PICA origins are visualized and normal. The basilar artery is normal. Both posterior cerebral arteries originate from basilar tip. A moderate stenosis is present in the right P2 segment. There is asymmetric attenuation of PCA branch vessels on the right. No significant proximal branch vessel occlusion is present. Venous sinuses: The dural sinuses are patent. The straight sinus and deep cerebral veins are intact. Cortical veins are unremarkable. Anatomic variants: None Review of the MIP images confirms the above findings IMPRESSION: 1. Atherosclerotic changes at the origin of the left subclavian artery without significant stenosis in the neck. 2. Vessel irregularity in the mid cervical segments bilaterally, left more prominent than right. Findings are consistent with fibromuscular dysplasia. 3. Atherosclerotic changes within the cavernous internal carotid arteries bilaterally without a significant stenosis. 4. No significant proximal stenosis, aneurysm, or branch vessel occlusion within the circle-of-Willis. 5. Moderate left A2/A3 segment stenosis. 6. Mild multilevel degenerative changes of the cervical spine. 7. Ground-glass attenuation the lung apices bilaterally likely reflects edema or atelectasis. Electronically Signed   By: San Morelle M.D.   On: 07/21/2017 18:41   Ct Angio Neck W Or Wo Contrast  Result Date: 07/21/2017 CLINICAL DATA:  New onset right-sided  weakness. EXAM: CT ANGIOGRAPHY HEAD AND NECK TECHNIQUE: Multidetector CT imaging of the head and neck was performed using the standard protocol during bolus administration of intravenous contrast. Multiplanar CT image reconstructions and MIPs were obtained to evaluate the vascular anatomy. Carotid stenosis measurements (when applicable) are obtained utilizing NASCET criteria, using the distal internal carotid diameter as the denominator. CONTRAST:  28mL ISOVUE-370 IOPAMIDOL (ISOVUE-370) INJECTION 76% COMPARISON:  CT head without contrast 07/21/2017 and 01/20/2017. MRI brain and MRA head 05/18/2015. FINDINGS: CTA NECK FINDINGS Aortic arch: A 3 vessel arch configuration is present. Atherosclerotic calcifications are present origin of the left subclavian artery without a significant stenosis. Right carotid system: The right common carotid artery is within normal limits. Bifurcation is unremarkable. There is tortuosity of the cervical right ICA. Irregularity is present in the mid cervical right ICA without significant stenosis. Left carotid system: The left common carotid artery is within normal limits. The bifurcation unremarkable. There is focal irregularity of the mid left cervical ICA. No significant luminal stenosis is present. Overall caliber of the left internal carotid artery is smaller than the right. Vertebral arteries: The right vertebral artery is slightly dominant to the left. Both vertebral arteries originate from the subclavian arteries without significant stenosis. There is no significant stenosis or focal vessel irregularity within either vertebral artery in the neck. Skeleton: Endplate degenerative changes are evident from C3-4 through C6-7. Osseous foraminal narrowing is greatest on the left at C5-6 and on the right at C4-5 and C6-7. Other neck: Multiple hypodense thyroid nodules are present. These measure less than 10 mm. There is no dominant lesion. No significant cervical adenopathy is present.  Salivary glands are within normal limits. No focal mucosal or submucosal lesions are evident. Vocal cords are midline and symmetric. Upper chest: Scattered ground-glass attenuation is present in the lung is bilaterally. No focal airspace consolidation is present. There is no nodule or mass lesion. Review of the MIP images confirms the above findings CTA HEAD FINDINGS Anterior circulation: No atherosclerotic calcifications are present within the cavernous internal carotid arteries bilaterally without significant stenosis. The ICA termini are within normal limits bilaterally. The A1 and M1 segments are normal. The anterior communicating artery is present. MCA bifurcations are within normal limits. The proximal MCA branch vessels are within normal limits bilaterally. There is some distal attenuation bilaterally. No significant proximal stenosis or occlusion is present. There is moderate focal stenosis within the mid left A2 segment. There is some attenuation of distal ACA branch vessels bilaterally. Posterior circulation: The right vertebral artery is slightly dominant. PICA origins are visualized and normal. The basilar artery is normal. Both posterior cerebral arteries originate from basilar tip. A moderate stenosis is present in the right P2 segment. There is asymmetric attenuation of PCA branch vessels on the right. No significant proximal branch vessel occlusion is present. Venous sinuses: The dural sinuses are patent. The straight sinus and deep cerebral veins are intact. Cortical veins are unremarkable. Anatomic variants: None Review of the MIP images confirms the above findings IMPRESSION: 1. Atherosclerotic changes at the origin of the left subclavian artery without significant stenosis in the neck. 2. Vessel irregularity in the mid cervical segments bilaterally, left more prominent than right. Findings are consistent with fibromuscular dysplasia. 3. Atherosclerotic changes within the cavernous internal  carotid arteries bilaterally without a significant stenosis. 4. No significant proximal stenosis, aneurysm, or branch vessel occlusion within the circle-of-Willis. 5. Moderate left A2/A3 segment stenosis. 6.  Mild multilevel degenerative changes of the cervical spine. 7. Ground-glass attenuation the lung apices bilaterally likely reflects edema or atelectasis. Electronically Signed   By: San Morelle M.D.   On: 07/21/2017 18:41   Ct Head Code Stroke Wo Contrast  Result Date: 07/21/2017 CLINICAL DATA:  Code stroke. New onset of right-sided weakness. The patient is on blood thinners. EXAM: CT HEAD WITHOUT CONTRAST TECHNIQUE: Contiguous axial images were obtained from the base of the skull through the vertex without intravenous contrast. COMPARISON:  CT head without contrast 01/20/2017. FINDINGS: Brain: Paraventricular white matter disease similar the prior exam. A remote lacunar infarct of the right internal capsule is stable. Remote lacunar infarcts are present in the thalami bilaterally. A remote lacunar infarct is present in the left caudate head. No new infarcts are present. The insular cortex is intact. The brainstem and cerebellum are normal. Vascular: Atherosclerotic calcifications are present within the cavernous internal carotid arteries bilaterally. There is no hyperdense vessel. Skull: Calvarium is intact. Sinuses/Orbits: A fluid level is present within the sphenoid sinuses. Paranasal sinuses are otherwise clear. The mastoid air cells are clear. ASPECTS ([place] Stroke Program Early CT Score) - Ganglionic level infarction (caudate, lentiform nuclei, internal capsule, insula, M1-M3 cortex): 7/7 - Supraganglionic infarction (M4-M6 cortex): 3/3 Total score (0-10 with 10 being normal): 10/10 IMPRESSION: 1. Stable appearance of remote lacunar infarcts involving the basal ganglia bilaterally and the right internal capsule. 2. No acute intracranial abnormality. 3. Stable atrophy and white matter  disease. This likely reflects the sequela of chronic microvascular ischemia. 4. Sphenoid sinus disease. 5. ASPECTS is 10/10 The above was relayed via text pager to Dr. Lorraine Lax on 07/21/2017 at 17:59 . Electronically Signed   By: San Morelle M.D.   On: 07/21/2017 18:00    Procedures Procedures (including critical care time)  Medications Ordered in ED Medications  iopamidol (ISOVUE-370) 76 % injection 50 mL (50 mLs Intravenous Contrast Given 07/21/17 1810)     Initial Impression / Assessment and Plan / ED Course  I have reviewed the triage vital signs and the nursing notes.  Pertinent labs & imaging results that were available during my care of the patient were reviewed by me and considered in my medical decision making (see chart for details).     Pt presenting for evaluation of aphasia.  Physical exam shows patient with improving speech, although not back to baseline.  No new or obvious neurologic deficits.  CTA and CT scan reassuring, no intracranial bleed.  Shows remote strokes.  Neurology has evaluated the patient, does not believe patient is having any stroke.  Labs reassuring, although SCr mildly elevated.  Physical exam concerning due to patient's cool right foot.  However pulses are dopplerable.  As patient has persistent right leg pain, will obtain x-rays of the right lower extremity to ensure no occult fracture.  Will order UA and plan for admission to hospitalist service per neurology recommendations.  Case discussed with attending, Dr. Johnney Killian evaluated patient.  X-rays viewed and interpreted by me, no fracture or dislocation.  Shows degenerative changes of the right knee and severe generation of the right hip.  Discussed with Dr. Myna Hidalgo from [facility], patient to be admitted to hospitalist service.  Final Clinical Impressions(s) / ED Diagnoses   Final diagnoses:  Aphasia    ED Discharge Orders    None       Franchot Heidelberg, PA-C 07/22/17 0040    Charlesetta Shanks,  MD 07/30/17 [phone]

## 2017-07-21 NOTE — ED Notes (Signed)
EMERGENCY DEPARTMENT  US GUIDANCE EXAM Emergency Ultrasound:  US Guidance for Needle Guidance  INDICATIONS: Difficult vascular access Linear probe used in real-time to visualize location of needle entry through skin. Blood return noted with no difficulties with flushing.    PERFORMED BY: Myself IMAGES ARCHIVED?: No LIMITATIONS: None VIEWS USED: Transverse INTERPRETATION: Needle visualized within vein   Tolerated well w/no complications.   Aaron Edelman RN  6:03 PM 07/21/17

## 2017-07-21 NOTE — Consult Note (Addendum)
Referring Physician: ER/EMS Consult Reason: Code Stroke Chief Complaint: unable to give c/o  HPI: Abigail Boyd is an 84 y.o. female who has extensive PMH, listed below. She has been in short term rehab for reconditioning and IV abx for UTI due to baseline debility post stroke in 2016. Baseline mRS 4, needs help with all ADLs, uses a walker or w/c currently per facility. Currently the pt is unable to provide any information. I spoke to the RN in charge of her care at her facility for further info. It seems that she went to therapy around 1pm and was at her baseline during rehab with PT/OT. The nurse reports she was normal after therapy, but asked for a Xanex and was quite anxious, tearful. Nurse at the facility says she gave her "two" Xanex.  When pts daughter arrived for visit, she was not talking or responding normally and the daughter called EMS. Due to residual rt side weakness and facial droop EMS called a code stroke. Upon arrival she was unable to answer questions, lethargic and dysarthric speech. CTH shows sevral old bilateral infarcts. No acute infarcts seen nor LVO. Thus no acute stroke treatments offered. I also confirmed with daughter that she has chronic right side facial droop, but seems that her old rt side weakness had resolved for most part, but does note that rt side seems weaker when tired or sick.   Date last known well: 07/21/17 Time last known well: 1300  tPA Given: no, pt is on Xarelto. Last dose was on time at 5pm per facility nurse. No doses missed. No LVO  Past Medical History Past Medical History:  Diagnosis Date  . Abnormal nuclear stress test 06/21/2011   Inferolateral reversible defect;--> cardiac cath & PCI of CxOM, occluded RCA with collaterals;; followup Myoview 11/2012: Low risk. Fixed basal inferior artifact normal EF. No ischemia  . Anxiety    When necessary Xanax  . Anxiety   . Asthma   . Bilateral cataracts    Status post stroke or correction  . CAD S/P  percutaneous coronary angioplasty 06/2011   s/P PCI to proximal OM1 w/ DES; occluded RCA with bridging and L-R collaterals (medical management)  . Chronic kidney disease (CKD), stage II (mild)    Related to current bladder infections (although diabetes cannot be excluded)  . Chronotropic incompetence with sinus node dysfunction [facility]) October 2013   On CPET test; beta blockers reduced  . Diabetes mellitus type 2, controlled ([place])    On oral medications  . Diverticulitis   . Essential hypertension    Allowing for permissive hypertension to avoid orthostatic hypotension  . GERD (gastroesophageal reflux disease)    On PPI  . Glaucoma   . History of syncope    Per EP - neurocardiogenic & not Bradycardia related (no PPM)  . History of unstable angina 06/13/2011   Jaw pain awakening from sleep -- Myoview --CATH --> PCI  . Hyperlipidemia with target LDL less than 70    HDL at goal, LDL not at goal, borderline triglycerides. On Crestor 20 mg  . Migraines   . OSA (obstructive sleep apnea)    hx bladder infections  . Osteoarthritis   . PAF (paroxysmal atrial fibrillation) ([place]) 10-11/ 2014   CardioNet Event Monitor: NSR & S Brady -- Rates 50-100; Total A. fib burden 35 hours and 27 minutes. 1296 episodes, longest was 1 hour 29 minutes. Rate ranged from 52-169 beats per minute.  . Seasonal allergies   . Shortness of  breath on exertion October 2013   2-D echo: Normal EF>55%, Gr 1DD, mild aortic sclerosis; Evaluated with CPET - peak VO2 97%; Chronotropic Incompetence (submaximal effort)  . Spinal stenosis of lumbar region 11/2011  . Stroke ([place])   . Tachycardia-bradycardia syndrome ([place]) 11/2012  . Urge incontinence     Surgical History Past Surgical History:  Procedure Laterality Date  . APPENDECTOMY    . BREAST BIOPSY     both breast  . cataracts     both eyes  . COLONOSCOPY    . CORONARY ANGIOPLASTY WITH STENT PLACEMENT  07/05/2011   Promus Element DES 2.5 mm x 16 mm-post dilated to  2.65 mm CX-Proximal OM1  . Holter Monitor  04/2015   Sinus rhythm with rates 52-149 BPM. Isolated PACs with rare couplets and bigeminy. Multiple short runs of PAT/PSVT. Arrhythmia run 120 bpm for 204 beats. 14% of time was in A. fib/flutter. - Reviewed by Dr. Caryl Comes. Not thought to be significant enough for her syncope.  Marland Kitchen KNEE ARTHROSCOPY WITH MEDIAL MENISECTOMY Right 06/24/2014   Procedure: RIGHT KNEE ARTHROSCOPY WITH partial lateral MENISECTOMY, abrasion chondroplasty of medial femoral condyle and patella, microfracture technique;  Surgeon: Latanya Maudlin, MD;  Location: [facility];  Service: Orthopedics;  Laterality: Right;  . LEFT HEART CATHETERIZATION WITH CORONARY ANGIOGRAM N/A 07/06/2011   Procedure: LEFT HEART CATHETERIZATION WITH CORONARY ANGIOGRAM;  Surgeon: Leonie Man, MD;  Location: [facility] CATH LAB: Unstable Angina --> Myoview wiht Inf-Lat Ischemia.  Proximal OM1 lesion --> PCI; 100% mid RCA with right to right and left to right bridging collaterals from circumflex RPL and LAD the PDA.  Marland Kitchen NM MYOVIEW LTD  October 2014    Low risk. Fixed basal inferior artifact normal EF. No ischemia --> (as compared to pre-PCI Myoview revealing inferolateral ischemia)  . rotator cuff Right   . TRANSTHORACIC ECHOCARDIOGRAM  11/2014   EF 65-70%, Mod LVH. Normal wall motion. Gr 1 DD. Normal valves.  Marland Kitchen VAGINAL HYSTERECTOMY      Family History  Family History  Problem Relation Age of Onset  . COPD Mother   . Pulmonary fibrosis Mother   . Heart attack Father   . Heart disease Father   . Stroke Father   . Cancer Sister   . Cancer Sister   . Clotting disorder Sister   . Heart attack Sister   . Arthritis Unknown   . Breast cancer Sister   . Colon cancer Neg Hx   . Esophageal cancer Neg Hx   . Stomach cancer Neg Hx     Social History:   reports that she has never smoked. She has never used smokeless tobacco. She reports that she does not drink alcohol or use drugs.  Allergies:  Allergies  Allergen  Reactions  . Amlodipine Cough  . Clopidogrel Bisulfate Cough  . Kenalog [Triamcinolone Acetonide]     unknown  . Lisinopril Cough  . Losartan Cough  . Sulfa Antibiotics Hives and Rash    Home Medications:   (Not in a hospital admission)  Hospital Medications   ROS:  Pt unable to provide   Physical Examination:  There were no vitals filed for this visit.  General - debilitated, obese pt Heart - Regular rate and rhythm - no murmer appreciated Lungs - Clear to auscultation Abdomen - Soft - non tender Extremities - Distal pulses intact - no edema Skin - Warm and dry   Neurologic Examination:  Mental Status: Lethargic, but alerts to voice easeily. Attempts to  answer questions, but speech is too dysarthric to understand. Sounds like she says her name, but all other speech is unintelligible. She does follow commands. Cranial Nerves: II: Discs not visualized; Visual fields grossly normal, pupils equal, round, reactive to light III,IV, VI: ptosis not present, extra-ocular motions intact bilaterally V,VII: smile asymmetric with right side droop, facial light touch sensation normal bilaterally VIII: hearing normal bilaterally IX,X: gag reflex present XI: bilateral shoulder shrug XII: midline tongue extension Motor:  RUE - 4+/5    LUE - 5/5   RLE - 4+/5    LLE - 5/5 Tone and bulk:normal tone throughout; no atrophy noted Sensory: Light touch intact throughout, bilaterally Deep Tendon Reflexes: 2+ and symmetric throughout Plantars: Right: downgoing   Left: downgoing Cerebellar: unable Gait: deferred at this time.   NIHSS 1a Level of Conscious:1 1b LOC Questions: 2 1c LOC Commands: 0 2 Best Gaze: 0 3 Visual: 0 4 Facial Palsy: 1 5a Motor Arm - left: 0 5b Motor Arm - Right:0  6a Motor Leg - Left: 0 6b Motor Leg - Right:1 7 Limb Ataxia: 0 8 Sensory: 0 9 Best Language:2  10 Dysarthria:2 11 Extinct. and Inattention:0 TOTAL: 9   LABORATORY STUDIES:  Basic  Metabolic Panel: Recent Labs  Lab 07/21/17 1749  NA 139  K 4.2  CL 94*  GLUCOSE 218*  BUN 20  CREATININE 1.00    CBC: Recent Labs  Lab 07/21/17 1743 07/21/17 1749  WBC 10.1  --   NEUTROABS 7.6  --   HGB 13.8 14.6  HCT 43.3 43.0  MCV 94.5  --   PLT 323  --     Cardiac Enzymes: No results for input(s): CKTOTAL, CKMB, CKMBINDEX, TROPONINI in the last 168 hours.  BNP: Invalid input(s): POCBNP  CBG: Recent Labs  Lab 07/21/17 1741  GLUCAP 207*    Microbiology:   Coagulation Studies: Recent Labs    07/21/17 1743  LABPROT 14.4  INR 1.13    Urinalysis: No results for input(s): COLORURINE, LABSPEC, PHURINE, GLUCOSEU, HGBUR, BILIRUBINUR, KETONESUR, PROTEINUR, UROBILINOGEN, NITRITE, LEUKOCYTESUR in the last 168 hours.  Invalid input(s): APPERANCEUR  Lipid Panel:     Component Value Date/Time   CHOL 212 (H) 05/18/2015 1219   CHOL 177 12/18/2012 0941   TRIG 225.0 (H) 05/18/2015 1219   HDL 54.20 05/18/2015 1219   HDL 50 12/18/2012 0941   CHOLHDL 4 05/18/2015 1219   VLDL 45.0 (H) 05/18/2015 1219   LDLCALC 98 12/27/2014 0735   LDLCALC 90 12/18/2012 0941    HgbA1C:  Lab Results  Component Value Date   HGBA1C 8.4 (H) 12/12/2016   Miscellaneous labs:  EKG  EKG   IMAGING: Ct Angio Head W Or Wo Contrast  Result Date: 07/21/2017 CLINICAL DATA:  New onset right-sided weakness. EXAM: CT ANGIOGRAPHY HEAD AND NECK TECHNIQUE: Multidetector CT imaging of the head and neck was performed using the standard protocol during bolus administration of intravenous contrast. Multiplanar CT image reconstructions and MIPs were obtained to evaluate the vascular anatomy. Carotid stenosis measurements (when applicable) are obtained utilizing NASCET criteria, using the distal internal carotid diameter as the denominator. CONTRAST:  35mL ISOVUE-370 IOPAMIDOL (ISOVUE-370) INJECTION 76% COMPARISON:  CT head without contrast 07/21/2017 and 01/20/2017. MRI brain and MRA head  05/18/2015. FINDINGS: CTA NECK FINDINGS Aortic arch: A 3 vessel arch configuration is present. Atherosclerotic calcifications are present origin of the left subclavian artery without a significant stenosis. Right carotid system: The right common carotid artery is within normal limits. Bifurcation  is unremarkable. There is tortuosity of the cervical right ICA. Irregularity is present in the mid cervical right ICA without significant stenosis. Left carotid system: The left common carotid artery is within normal limits. The bifurcation unremarkable. There is focal irregularity of the mid left cervical ICA. No significant luminal stenosis is present. Overall caliber of the left internal carotid artery is smaller than the right. Vertebral arteries: The right vertebral artery is slightly dominant to the left. Both vertebral arteries originate from the subclavian arteries without significant stenosis. There is no significant stenosis or focal vessel irregularity within either vertebral artery in the neck. Skeleton: Endplate degenerative changes are evident from C3-4 through C6-7. Osseous foraminal narrowing is greatest on the left at C5-6 and on the right at C4-5 and C6-7. Other neck: Multiple hypodense thyroid nodules are present. These measure less than 10 mm. There is no dominant lesion. No significant cervical adenopathy is present. Salivary glands are within normal limits. No focal mucosal or submucosal lesions are evident. Vocal cords are midline and symmetric. Upper chest: Scattered ground-glass attenuation is present in the lung is bilaterally. No focal airspace consolidation is present. There is no nodule or mass lesion. Review of the MIP images confirms the above findings CTA HEAD FINDINGS Anterior circulation: No atherosclerotic calcifications are present within the cavernous internal carotid arteries bilaterally without significant stenosis. The ICA termini are within normal limits bilaterally. The A1 and M1  segments are normal. The anterior communicating artery is present. MCA bifurcations are within normal limits. The proximal MCA branch vessels are within normal limits bilaterally. There is some distal attenuation bilaterally. No significant proximal stenosis or occlusion is present. There is moderate focal stenosis within the mid left A2 segment. There is some attenuation of distal ACA branch vessels bilaterally. Posterior circulation: The right vertebral artery is slightly dominant. PICA origins are visualized and normal. The basilar artery is normal. Both posterior cerebral arteries originate from basilar tip. A moderate stenosis is present in the right P2 segment. There is asymmetric attenuation of PCA branch vessels on the right. No significant proximal branch vessel occlusion is present. Venous sinuses: The dural sinuses are patent. The straight sinus and deep cerebral veins are intact. Cortical veins are unremarkable. Anatomic variants: None Review of the MIP images confirms the above findings IMPRESSION: 1. Atherosclerotic changes at the origin of the left subclavian artery without significant stenosis in the neck. 2. Vessel irregularity in the mid cervical segments bilaterally, left more prominent than right. Findings are consistent with fibromuscular dysplasia. 3. Atherosclerotic changes within the cavernous internal carotid arteries bilaterally without a significant stenosis. 4. No significant proximal stenosis, aneurysm, or branch vessel occlusion within the circle-of-Willis. 5. Moderate left A2/A3 segment stenosis. 6. Mild multilevel degenerative changes of the cervical spine. 7. Ground-glass attenuation the lung apices bilaterally likely reflects edema or atelectasis. Electronically Signed   By: San Morelle M.D.   On: 07/21/2017 18:41   Ct Angio Neck W Or Wo Contrast  Result Date: 07/21/2017 CLINICAL DATA:  New onset right-sided weakness. EXAM: CT ANGIOGRAPHY HEAD AND NECK TECHNIQUE:  Multidetector CT imaging of the head and neck was performed using the standard protocol during bolus administration of intravenous contrast. Multiplanar CT image reconstructions and MIPs were obtained to evaluate the vascular anatomy. Carotid stenosis measurements (when applicable) are obtained utilizing NASCET criteria, using the distal internal carotid diameter as the denominator. CONTRAST:  3mL ISOVUE-370 IOPAMIDOL (ISOVUE-370) INJECTION 76% COMPARISON:  CT head without contrast 07/21/2017 and 01/20/2017. MRI brain and  MRA head 05/18/2015. FINDINGS: CTA NECK FINDINGS Aortic arch: A 3 vessel arch configuration is present. Atherosclerotic calcifications are present origin of the left subclavian artery without a significant stenosis. Right carotid system: The right common carotid artery is within normal limits. Bifurcation is unremarkable. There is tortuosity of the cervical right ICA. Irregularity is present in the mid cervical right ICA without significant stenosis. Left carotid system: The left common carotid artery is within normal limits. The bifurcation unremarkable. There is focal irregularity of the mid left cervical ICA. No significant luminal stenosis is present. Overall caliber of the left internal carotid artery is smaller than the right. Vertebral arteries: The right vertebral artery is slightly dominant to the left. Both vertebral arteries originate from the subclavian arteries without significant stenosis. There is no significant stenosis or focal vessel irregularity within either vertebral artery in the neck. Skeleton: Endplate degenerative changes are evident from C3-4 through C6-7. Osseous foraminal narrowing is greatest on the left at C5-6 and on the right at C4-5 and C6-7. Other neck: Multiple hypodense thyroid nodules are present. These measure less than 10 mm. There is no dominant lesion. No significant cervical adenopathy is present. Salivary glands are within normal limits. No focal mucosal  or submucosal lesions are evident. Vocal cords are midline and symmetric. Upper chest: Scattered ground-glass attenuation is present in the lung is bilaterally. No focal airspace consolidation is present. There is no nodule or mass lesion. Review of the MIP images confirms the above findings CTA HEAD FINDINGS Anterior circulation: No atherosclerotic calcifications are present within the cavernous internal carotid arteries bilaterally without significant stenosis. The ICA termini are within normal limits bilaterally. The A1 and M1 segments are normal. The anterior communicating artery is present. MCA bifurcations are within normal limits. The proximal MCA branch vessels are within normal limits bilaterally. There is some distal attenuation bilaterally. No significant proximal stenosis or occlusion is present. There is moderate focal stenosis within the mid left A2 segment. There is some attenuation of distal ACA branch vessels bilaterally. Posterior circulation: The right vertebral artery is slightly dominant. PICA origins are visualized and normal. The basilar artery is normal. Both posterior cerebral arteries originate from basilar tip. A moderate stenosis is present in the right P2 segment. There is asymmetric attenuation of PCA branch vessels on the right. No significant proximal branch vessel occlusion is present. Venous sinuses: The dural sinuses are patent. The straight sinus and deep cerebral veins are intact. Cortical veins are unremarkable. Anatomic variants: None Review of the MIP images confirms the above findings IMPRESSION: 1. Atherosclerotic changes at the origin of the left subclavian artery without significant stenosis in the neck. 2. Vessel irregularity in the mid cervical segments bilaterally, left more prominent than right. Findings are consistent with fibromuscular dysplasia. 3. Atherosclerotic changes within the cavernous internal carotid arteries bilaterally without a significant stenosis. 4.  No significant proximal stenosis, aneurysm, or branch vessel occlusion within the circle-of-Willis. 5. Moderate left A2/A3 segment stenosis. 6. Mild multilevel degenerative changes of the cervical spine. 7. Ground-glass attenuation the lung apices bilaterally likely reflects edema or atelectasis. Electronically Signed   By: San Morelle M.D.   On: 07/21/2017 18:41   Ct Head Code Stroke Wo Contrast  Result Date: 07/21/2017 CLINICAL DATA:  Code stroke. New onset of right-sided weakness. The patient is on blood thinners. EXAM: CT HEAD WITHOUT CONTRAST TECHNIQUE: Contiguous axial images were obtained from the base of the skull through the vertex without intravenous contrast. COMPARISON:  CT head without  contrast 01/20/2017. FINDINGS: Brain: Paraventricular white matter disease similar the prior exam. A remote lacunar infarct of the right internal capsule is stable. Remote lacunar infarcts are present in the thalami bilaterally. A remote lacunar infarct is present in the left caudate head. No new infarcts are present. The insular cortex is intact. The brainstem and cerebellum are normal. Vascular: Atherosclerotic calcifications are present within the cavernous internal carotid arteries bilaterally. There is no hyperdense vessel. Skull: Calvarium is intact. Sinuses/Orbits: A fluid level is present within the sphenoid sinuses. Paranasal sinuses are otherwise clear. The mastoid air cells are clear. ASPECTS ([name] Stroke Program Early CT Score) - Ganglionic level infarction (caudate, lentiform nuclei, internal capsule, insula, M1-M3 cortex): 7/7 - Supraganglionic infarction (M4-M6 cortex): 3/3 Total score (0-10 with 10 being normal): 10/10 IMPRESSION: 1. Stable appearance of remote lacunar infarcts involving the basal ganglia bilaterally and the right internal capsule. 2. No acute intracranial abnormality. 3. Stable atrophy and white matter disease. This likely reflects the sequela of chronic microvascular  ischemia. 4. Sphenoid sinus disease. 5. ASPECTS is 10/10 The above was relayed via text pager to Dr. Lorraine Lax on 07/21/2017 at 17:59 . Electronically Signed   By: San Morelle M.D.   On: 07/21/2017 18:00      Assessment: 84 y.o. female who is debilitated at baseline Stroke Risk Factors - Afib, prev strokes, HTN, HLD, debility, cerebral vascular disease  # AMS, speech changes and encephalopathy- likely toxic/metabolic, medication effect after Xanex was given. She also has poor neurologic reserve and currently acutely deconditioned in setting of UTI. Ddx: TIA, Seizures, infection. # Cerebral vascular disease and h/o multiple strokes- No new focal deficits at this time. She is at risk for seizures. Will consider EEG if she does not clear in next 24h  # baseline debility- currently in rehab as she decompensated to point of not being able to stand with UTI # Afib- chronic anticoagulation. I confirmed with rehab that she has been given her Louanna Raw on time and never missed a dose. # UTI- currently being treated with IV Abx per report.    Recs/Plan:  Observe and allow time for medication effect to clear   No further stroke work up at this time  If there are new focal deficits, would consider MRI  If AMS, speech does not improve overnight, consider EEG tomorrow  Frequent neuro checks  Fall Precautions  Seen with RN, updated ER PA. I have d/w the pts dtr at bedside the above plan. She is active in goals of care.   NEUROHOSPITALIST ADDENDUM Seen and examined the patient today. I have reviewed the contents of history and physical exam as documented by PA/ARNP/Resident and agree with above documentation.  I have discussed and formulated the above plan as documented. Edits to the note have been made as needed.    Karena Addison Willie Loy MD [facility] [phone]   If 7pm to 7am, please call on call as listed on AMION.

## 2017-07-22 ENCOUNTER — Observation Stay (HOSPITAL_COMMUNITY): Payer: Medicare Other

## 2017-07-22 ENCOUNTER — Encounter (HOSPITAL_COMMUNITY): Payer: Self-pay

## 2017-07-22 DIAGNOSIS — E119 Type 2 diabetes mellitus without complications: Secondary | ICD-10-CM

## 2017-07-22 DIAGNOSIS — G9340 Encephalopathy, unspecified: Secondary | ICD-10-CM

## 2017-07-22 DIAGNOSIS — N182 Chronic kidney disease, stage 2 (mild): Secondary | ICD-10-CM | POA: Diagnosis not present

## 2017-07-22 DIAGNOSIS — Z8673 Personal history of transient ischemic attack (TIA), and cerebral infarction without residual deficits: Secondary | ICD-10-CM | POA: Diagnosis not present

## 2017-07-22 DIAGNOSIS — I10 Essential (primary) hypertension: Secondary | ICD-10-CM

## 2017-07-22 DIAGNOSIS — Z794 Long term (current) use of insulin: Secondary | ICD-10-CM

## 2017-07-22 DIAGNOSIS — I5032 Chronic diastolic (congestive) heart failure: Secondary | ICD-10-CM | POA: Diagnosis not present

## 2017-07-22 DIAGNOSIS — I480 Paroxysmal atrial fibrillation: Secondary | ICD-10-CM

## 2017-07-22 DIAGNOSIS — I2510 Atherosclerotic heart disease of native coronary artery without angina pectoris: Secondary | ICD-10-CM | POA: Diagnosis not present

## 2017-07-22 DIAGNOSIS — Z9861 Coronary angioplasty status: Secondary | ICD-10-CM | POA: Diagnosis not present

## 2017-07-22 DIAGNOSIS — R479 Unspecified speech disturbances: Secondary | ICD-10-CM | POA: Diagnosis not present

## 2017-07-22 LAB — CBC
HCT: 46.4 % — ABNORMAL HIGH (ref 36.0–46.0)
Hemoglobin: 15 g/dL (ref 12.0–15.0)
MCH: 30.5 pg (ref 26.0–34.0)
MCHC: 32.3 g/dL (ref 30.0–36.0)
MCV: 94.5 fL (ref 78.0–100.0)
Platelets: 339 10*3/uL (ref 150–400)
RBC: 4.91 MIL/uL (ref 3.87–5.11)
RDW: 13.6 % (ref 11.5–15.5)
WBC: 12.3 10*3/uL — ABNORMAL HIGH (ref 4.0–10.5)

## 2017-07-22 LAB — CBG MONITORING, ED
GLUCOSE-CAPILLARY: 182 mg/dL — AB (ref 65–99)
GLUCOSE-CAPILLARY: 249 mg/dL — AB (ref 65–99)
GLUCOSE-CAPILLARY: 268 mg/dL — AB (ref 65–99)
Glucose-Capillary: 209 mg/dL — ABNORMAL HIGH (ref 65–99)

## 2017-07-22 LAB — BASIC METABOLIC PANEL
ANION GAP: 16 — AB (ref 5–15)
BUN: 14 mg/dL (ref 6–20)
CALCIUM: 9.8 mg/dL (ref 8.9–10.3)
CO2: 24 mmol/L (ref 22–32)
Chloride: 98 mmol/L — ABNORMAL LOW (ref 101–111)
Creatinine, Ser: 0.99 mg/dL (ref 0.44–1.00)
GFR, EST AFRICAN AMERICAN: 59 mL/min — AB (ref >60)
GFR, EST NON AFRICAN AMERICAN: 51 mL/min — AB (ref >60)
GLUCOSE: 242 mg/dL — AB (ref 65–99)
Potassium: 4.6 mmol/L (ref 3.5–5.1)
SODIUM: 138 mmol/L (ref 135–145)

## 2017-07-22 LAB — GLUCOSE, CAPILLARY
Glucose-Capillary: 174 mg/dL — ABNORMAL HIGH (ref 65–99)
Glucose-Capillary: 201 mg/dL — ABNORMAL HIGH (ref 65–99)
Glucose-Capillary: 230 mg/dL — ABNORMAL HIGH (ref 65–99)

## 2017-07-22 LAB — TSH: TSH: 4.194 u[IU]/mL (ref 0.350–4.500)

## 2017-07-22 LAB — AMMONIA: AMMONIA: 36 umol/L — AB (ref 9–35)

## 2017-07-22 LAB — HIV ANTIBODY (ROUTINE TESTING W REFLEX): HIV Screen 4th Generation wRfx: NONREACTIVE

## 2017-07-22 LAB — VITAMIN B12: Vitamin B-12: 342 pg/mL (ref 180–914)

## 2017-07-22 MED ORDER — INSULIN GLARGINE 100 UNIT/ML ~~LOC~~ SOLN
50.0000 [IU] | Freq: Every day | SUBCUTANEOUS | Status: DC
  Administered 2017-07-22 – 2017-07-24 (×4): 50 [IU] via SUBCUTANEOUS
  Filled 2017-07-22 (×5): qty 0.5

## 2017-07-22 MED ORDER — SODIUM CHLORIDE 0.9 % IV SOLN
INTRAVENOUS | Status: DC
  Administered 2017-07-22: 22:00:00 via INTRAVENOUS

## 2017-07-22 MED ORDER — ENOXAPARIN SODIUM 100 MG/ML ~~LOC~~ SOLN
1.0000 mg/kg | Freq: Two times a day (BID) | SUBCUTANEOUS | Status: DC
  Administered 2017-07-22 – 2017-07-23 (×2): 85 mg via SUBCUTANEOUS
  Filled 2017-07-22 (×2): qty 0.85

## 2017-07-22 MED ORDER — HYDRALAZINE HCL 20 MG/ML IJ SOLN: 10.0000 mg | INTRAMUSCULAR | Status: DC | PRN

## 2017-07-22 MED ORDER — HYDRALAZINE HCL 20 MG/ML IJ SOLN
5.0000 mg | INTRAMUSCULAR | Status: DC | PRN
  Administered 2017-07-22: 5 mg via INTRAVENOUS
  Filled 2017-07-22: qty 1

## 2017-07-22 MED ORDER — DOCUSATE SODIUM 100 MG PO CAPS
100.0000 mg | ORAL_CAPSULE | Freq: Every day | ORAL | Status: DC
  Administered 2017-07-24: 100 mg via ORAL
  Filled 2017-07-22 (×3): qty 1

## 2017-07-22 MED ORDER — ACETAMINOPHEN 325 MG PO TABS
650.0000 mg | ORAL_TABLET | Freq: Four times a day (QID) | ORAL | Status: DC | PRN
  Administered 2017-07-24 – 2017-07-25 (×3): 650 mg via ORAL
  Filled 2017-07-22 (×3): qty 2

## 2017-07-22 MED ORDER — METOPROLOL TARTRATE 5 MG/5ML IV SOLN
5.0000 mg | Freq: Four times a day (QID) | INTRAVENOUS | Status: DC
  Administered 2017-07-22 – 2017-07-23 (×2): 5 mg via INTRAVENOUS
  Filled 2017-07-22: qty 5

## 2017-07-22 MED ORDER — ONDANSETRON HCL 4 MG/2ML IJ SOLN: 4.0000 mg | Freq: Four times a day (QID) | INTRAMUSCULAR | Status: DC | PRN

## 2017-07-22 MED ORDER — GABAPENTIN 300 MG PO CAPS
300.0000 mg | ORAL_CAPSULE | Freq: Three times a day (TID) | ORAL | Status: DC
  Administered 2017-07-23 – 2017-07-25 (×6): 300 mg via ORAL
  Filled 2017-07-22 (×8): qty 1

## 2017-07-22 MED ORDER — BISACODYL 10 MG RE SUPP: 10.0000 mg | Freq: Every day | RECTAL | Status: DC | PRN

## 2017-07-22 MED ORDER — METOPROLOL TARTRATE 5 MG/5ML IV SOLN
INTRAVENOUS | Status: AC
  Filled 2017-07-22: qty 5

## 2017-07-22 MED ORDER — TIMOLOL MALEATE 0.5 % OP SOLN
1.0000 [drp] | Freq: Every day | OPHTHALMIC | Status: DC
  Administered 2017-07-23 – 2017-07-25 (×3): 1 [drp] via OPHTHALMIC
  Filled 2017-07-22 (×2): qty 5

## 2017-07-22 MED ORDER — OMEGA-3-ACID ETHYL ESTERS 1 G PO CAPS
1.0000 g | ORAL_CAPSULE | Freq: Every day | ORAL | Status: DC
  Filled 2017-07-22 (×2): qty 1

## 2017-07-22 MED ORDER — SODIUM CHLORIDE 0.9% FLUSH: 3.0000 mL | INTRAVENOUS | Status: DC | PRN

## 2017-07-22 MED ORDER — SODIUM CHLORIDE 0.9 % IV SOLN: 250.0000 mL | INTRAVENOUS | Status: DC | PRN

## 2017-07-22 MED ORDER — LATANOPROST 0.005 % OP SOLN
1.0000 [drp] | Freq: Every day | OPHTHALMIC | Status: DC
  Administered 2017-07-22 – 2017-07-24 (×3): 1 [drp] via OPHTHALMIC
  Filled 2017-07-22: qty 2.5

## 2017-07-22 MED ORDER — PANTOPRAZOLE SODIUM 40 MG PO TBEC
40.0000 mg | DELAYED_RELEASE_TABLET | Freq: Every day | ORAL | Status: DC
  Administered 2017-07-23 – 2017-07-25 (×3): 40 mg via ORAL
  Filled 2017-07-22 (×3): qty 1

## 2017-07-22 MED ORDER — ENOXAPARIN SODIUM 100 MG/ML ~~LOC~~ SOLN: 1.0000 mg/kg | SUBCUTANEOUS | Status: DC

## 2017-07-22 MED ORDER — MIRABEGRON ER 25 MG PO TB24
50.0000 mg | ORAL_TABLET | Freq: Every day | ORAL | Status: DC
  Administered 2017-07-23 – 2017-07-25 (×3): 50 mg via ORAL
  Filled 2017-07-22 (×3): qty 2
  Filled 2017-07-22: qty 1

## 2017-07-22 MED ORDER — ACETAMINOPHEN 650 MG RE SUPP: 650.0000 mg | Freq: Four times a day (QID) | RECTAL | Status: DC | PRN

## 2017-07-22 MED ORDER — FLUOXETINE HCL 20 MG PO CAPS
20.0000 mg | ORAL_CAPSULE | Freq: Every day | ORAL | Status: DC
  Administered 2017-07-23 – 2017-07-25 (×3): 20 mg via ORAL
  Filled 2017-07-22 (×3): qty 1

## 2017-07-22 MED ORDER — SODIUM CHLORIDE 0.9% FLUSH
3.0000 mL | Freq: Two times a day (BID) | INTRAVENOUS | Status: DC
  Administered 2017-07-22 – 2017-07-24 (×4): 3 mL via INTRAVENOUS

## 2017-07-22 MED ORDER — METOPROLOL TARTRATE 25 MG PO TABS
25.0000 mg | ORAL_TABLET | Freq: Two times a day (BID) | ORAL | Status: DC
  Filled 2017-07-22: qty 1

## 2017-07-22 MED ORDER — POLYETHYLENE GLYCOL 3350 17 G PO PACK: 17.0000 g | PACK | Freq: Every day | ORAL | Status: DC | PRN

## 2017-07-22 MED ORDER — MAGNESIUM OXIDE 400 (241.3 MG) MG PO TABS
400.0000 mg | ORAL_TABLET | Freq: Every day | ORAL | Status: DC
  Administered 2017-07-24: 400 mg via ORAL
  Filled 2017-07-22 (×3): qty 1

## 2017-07-22 MED ORDER — RIVAROXABAN 15 MG PO TABS: 15.0000 mg | ORAL_TABLET | Freq: Every evening | ORAL | Status: DC

## 2017-07-22 MED ORDER — METOPROLOL TARTRATE 5 MG/5ML IV SOLN
5.0000 mg | Freq: Four times a day (QID) | INTRAVENOUS | Status: DC
  Filled 2017-07-22: qty 5

## 2017-07-22 MED ORDER — ONDANSETRON HCL 4 MG PO TABS: 4.0000 mg | ORAL_TABLET | Freq: Four times a day (QID) | ORAL | Status: DC | PRN

## 2017-07-22 MED ORDER — INSULIN ASPART 100 UNIT/ML ~~LOC~~ SOLN
0.0000 [IU] | SUBCUTANEOUS | Status: DC
  Administered 2017-07-22: 5 [IU] via SUBCUTANEOUS
  Administered 2017-07-22: 3 [IU] via SUBCUTANEOUS
  Administered 2017-07-22 – 2017-07-23 (×2): 5 [IU] via SUBCUTANEOUS
  Administered 2017-07-23: 3 [IU] via SUBCUTANEOUS
  Administered 2017-07-23: 8 [IU] via SUBCUTANEOUS
  Administered 2017-07-23: 3 [IU] via SUBCUTANEOUS
  Administered 2017-07-23 – 2017-07-24 (×2): 2 [IU] via SUBCUTANEOUS
  Administered 2017-07-24 (×3): 3 [IU] via SUBCUTANEOUS
  Administered 2017-07-24: 8 [IU] via SUBCUTANEOUS
  Administered 2017-07-24: 3 [IU] via SUBCUTANEOUS
  Administered 2017-07-25: 2 [IU] via SUBCUTANEOUS
  Administered 2017-07-25: 5 [IU] via SUBCUTANEOUS
  Administered 2017-07-25: 3 [IU] via SUBCUTANEOUS
  Filled 2017-07-22: qty 1

## 2017-07-22 MED ORDER — SODIUM CHLORIDE 0.9% FLUSH
3.0000 mL | Freq: Two times a day (BID) | INTRAVENOUS | Status: DC
  Administered 2017-07-22 – 2017-07-24 (×6): 3 mL via INTRAVENOUS

## 2017-07-22 MED ORDER — ROPINIROLE HCL 1 MG PO TABS
0.5000 mg | ORAL_TABLET | Freq: Every day | ORAL | Status: DC
  Administered 2017-07-24: 0.5 mg via ORAL
  Filled 2017-07-22 (×4): qty 1

## 2017-07-22 NOTE — Evaluation (Addendum)
Clinical/Bedside Swallow Evaluation Patient Details  Name: Abigail Boyd MRN: [id number] Date of Birth: [birth year]  Today's Date: 07/22/2017 Time: SLP Start Time (ACUTE ONLY): 75      Past Medical History:  Past Medical History:  Diagnosis Date  . Abnormal nuclear stress test 06/21/2011   Inferolateral reversible defect;--> cardiac cath & PCI of CxOM, occluded RCA with collaterals;; followup Myoview 11/2012: Low risk. Fixed basal inferior artifact normal EF. No ischemia  . Anxiety    When necessary Xanax  . Anxiety   . Asthma   . Bilateral cataracts    Status post stroke or correction  . CAD S/P percutaneous coronary angioplasty 06/2011   s/P PCI to proximal OM1 w/ DES; occluded RCA with bridging and L-R collaterals (medical management)  . Chronic kidney disease (CKD), stage II (mild)    Related to current bladder infections (although diabetes cannot be excluded)  . Chronotropic incompetence with sinus node dysfunction [facility]) October 2013   On CPET test; beta blockers reduced  . Diabetes mellitus type 2, controlled ([place])    On oral medications  . Diverticulitis   . Essential hypertension    Allowing for permissive hypertension to avoid orthostatic hypotension  . GERD (gastroesophageal reflux disease)    On PPI  . Glaucoma   . History of syncope    Per EP - neurocardiogenic & not Bradycardia related (no PPM)  . History of unstable angina 06/13/2011   Jaw pain awakening from sleep -- Myoview --CATH --> PCI  . Hyperlipidemia with target LDL less than 70    HDL at goal, LDL not at goal, borderline triglycerides. On Crestor 20 mg  . Migraines   . OSA (obstructive sleep apnea)    hx bladder infections  . Osteoarthritis   . PAF (paroxysmal atrial fibrillation) ([place]) 10-11/ 2014   CardioNet Event Monitor: NSR & S Brady -- Rates 50-100; Total A. fib burden 35 hours and 27 minutes. 1296 episodes, longest was 1 hour 29 minutes. Rate ranged from 52-169 beats per minute.  . Seasonal  allergies   . Shortness of breath on exertion October 2013   2-D echo: Normal EF>55%, Gr 1DD, mild aortic sclerosis; Evaluated with CPET - peak VO2 97%; Chronotropic Incompetence (submaximal effort)  . Spinal stenosis of lumbar region 11/2011  . Stroke ([place])   . Tachycardia-bradycardia syndrome ([place]) 11/2012  . Urge incontinence    Past Surgical History:  Past Surgical History:  Procedure Laterality Date  . APPENDECTOMY    . BREAST BIOPSY     both breast  . cataracts     both eyes  . COLONOSCOPY    . CORONARY ANGIOPLASTY WITH STENT PLACEMENT  07/05/2011   Promus Element DES 2.5 mm x 16 mm-post dilated to 2.65 mm CX-Proximal OM1  . Holter Monitor  04/2015   Sinus rhythm with rates 52-149 BPM. Isolated PACs with rare couplets and bigeminy. Multiple short runs of PAT/PSVT. Arrhythmia run 120 bpm for 204 beats. 14% of time was in A. fib/flutter. - Reviewed by Dr. Caryl Comes. Not thought to be significant enough for her syncope.  Marland Kitchen KNEE ARTHROSCOPY WITH MEDIAL MENISECTOMY Right 06/24/2014   Procedure: RIGHT KNEE ARTHROSCOPY WITH partial lateral MENISECTOMY, abrasion chondroplasty of medial femoral condyle and patella, microfracture technique;  Surgeon: Latanya Maudlin, MD;  Location: [facility];  Service: Orthopedics;  Laterality: Right;  . LEFT HEART CATHETERIZATION WITH CORONARY ANGIOGRAM N/A 07/06/2011   Procedure: LEFT HEART CATHETERIZATION WITH CORONARY ANGIOGRAM;  Surgeon: Leonie Man, MD;  Location: [facility] CATH LAB: Unstable Angina --> Myoview wiht Inf-Lat Ischemia.  Proximal OM1 lesion --> PCI; 100% mid RCA with right to right and left to right bridging collaterals from circumflex RPL and LAD the PDA.  Marland Kitchen NM MYOVIEW LTD  October 2014    Low risk. Fixed basal inferior artifact normal EF. No ischemia --> (as compared to pre-PCI Myoview revealing inferolateral ischemia)  . rotator cuff Right   . TRANSTHORACIC ECHOCARDIOGRAM  11/2014   EF 65-70%, Mod LVH. Normal wall motion. Gr 1 DD. Normal valves.  Marland Kitchen  VAGINAL HYSTERECTOMY     HPI:  Abigail Boyd is an 84 y.o. female who has been in short term rehab for reconditioning and IV abx for UTI due to baseline debility post stroke in 2016. Baseline mRS 4, as she needs help with all ADLs, uses a walker or w/c currently per facility. The pt was given Xanex after her therapy on the afternoon of 5/25.  When pts daughter arrived for visit, she was not talking or responding normally and the daughter called EMS. Due to residual rt side weakness and facial droop, EMS called a pre-arrival code stroke. Upon arrival she was unable to answer questions, lethargic and dysarthric speech. She had a right side facial droop and not using right side as briskly. CTH shows sevral old bilateral infarcts. No acute infarcts seen nor LVO. Swallow evaluation ordered.   Assessment / Plan / Recommendation Clinical Impression   Patient presents with signs of oropharyngeal dysphagia with suspected sensory and motor impairments. Oral motor examination noted for right sided facial and lingual weakness, not noted in prior SLP assessment in 2016. Swallow initiation appears delayed. Manipulates ice chips, teaspoon of water with delayed swallow initiation noted however no overt signs of aspiration. Small cup sip of liquids results in immediate wet coughing, choking, with dyspnea and pt tachy in 140s, desaturation into low 80s, respiratory rate to upper 30s; strongly suspect aspiration. Pt's voice weak; she is speaking in a whisper and speech is dysarthric. Recommend NPO, discussed objective testing of swallow function (MBS); will follow up next date for readiness.     SLP Visit Diagnosis: Dysphagia, oropharyngeal phase (R13.12)    Aspiration Risk  Severe aspiration risk    Diet Recommendation NPO   Medication Administration: Via alternative means    Other  Recommendations Oral Care Recommendations: Oral care QID Other Recommendations: Have oral suction available;Remove water pitcher    Follow up Recommendations Other (comment)(tbd)      Frequency and Duration            Prognosis        Swallow Study   General Date of Onset: 07/21/17 HPI: DALEIGH POLLINGER is an 84 y.o. female who has been in short term rehab for reconditioning and IV abx for UTI due to baseline debility post stroke in 2016. Baseline mRS 4, as she needs help with all ADLs, uses a walker or w/c currently per facility. The pt was given Xanex after her therapy on the afternoon of 5/25.  When pts daughter arrived for visit, she was not talking or responding normally and the daughter called EMS. Due to residual rt side weakness and facial droop, EMS called a pre-arrival code stroke. Upon arrival she was unable to answer questions, lethargic and dysarthric speech. She had a right side facial droop and not using right side as briskly. CTH shows sevral old bilateral infarcts. No acute infarcts seen nor LVO. Swallow evaluation ordered. Type of Study:  Bedside Swallow Evaluation Previous Swallow Assessment: none in chart; failed stroke swallow screen Diet Prior to this Study: NPO Temperature Spikes Noted: No Respiratory Status: Room air History of Recent Intubation: No Behavior/Cognition: Alert;Cooperative Oral Cavity Assessment: Within Functional Limits Oral Care Completed by SLP: No Vision: Functional for self-feeding Self-Feeding Abilities: Needs assist Patient Positioning: Upright in bed Baseline Vocal Quality: Breathy;Low vocal intensity;Suspected CN X (Vagus) involvement Volitional Cough: Weak Volitional Swallow: Able to elicit    Oral/Motor/Sensory Function Overall Oral Motor/Sensory Function: Moderate impairment Facial ROM: Reduced right;Suspected CN VII (facial) dysfunction Facial Symmetry: Abnormal symmetry right;Suspected CN VII (facial) dysfunction Facial Strength: Reduced right;Suspected CN VII (facial) dysfunction Lingual Symmetry: Abnormal symmetry right;Suspected CN XII (hypoglossal)  dysfunction Mandible: Within Functional Limits   Ice Chips Ice chips: Impaired Pharyngeal Phase Impairments: Suspected delayed Swallow   Thin Liquid Thin Liquid: Impaired Presentation: Spoon;Cup Oral Phase Impairments: Reduced labial seal Oral Phase Functional Implications: Right anterior spillage Pharyngeal  Phase Impairments: Suspected delayed Swallow;Change in Vital Signs;Cough - Immediate    Nectar Thick Nectar Thick Liquid: Not tested   Honey Thick Honey Thick Liquid: Not tested   Puree Puree: Not tested   Solid   GO   Solid: Not tested       Deneise Lever, MS, CCC-SLP Speech-Language Pathologist [phone]  Aliene Altes 07/22/2017,2:40 PM

## 2017-07-22 NOTE — ED Notes (Signed)
Nurse will draw labs. 

## 2017-07-22 NOTE — Progress Notes (Signed)
Pt refuses cpap at this time.  Rt will monitor. 

## 2017-07-22 NOTE — H&P (Signed)
History and Physical    Abigail Boyd HYW:[id number] DOB: April 24, 1929 DOA: 07/21/2017  PCP: Leone Haven, MD   Patient coming from: SNF   Chief Complaint: Acute somnolence and speech difficulty  HPI: Abigail Boyd is a 84 y.o. female with medical history significant for history of CVA with residual right-sided motor deficits, chronic diastolic CHF, paroxysmal atrial fibrillation on Xarelto, insulin-dependent diabetes mellitus, and chronic kidney disease stage II, now presenting with acute somnolence and speech difficulty from her SNF where she is rehabilitating after admission for UTI and generalized weakness.  Patient had reportedly been in her usual state, participated in physical therapy, began to complain of right leg pain and was tearful and anxious, treated with Xanax at her nursing facility.  She was then found to be somnolent, difficult to awake, and with apparent difficulty speaking.  A right facial droop and right arm weakness was noted and she was brought into the ED as a code stroke.  ED Course: Upon arrival to the ED, patient is found to be afebrile, saturating well on room air, and with vitals otherwise stable.  EKG features a sinus rhythm and radiographs of the right leg are negative for acute findings.  Noncontrast head CT is negative for acute intracranial abnormality, but notable for stable remote infarcts.  Chemistry panel features a creatinine 1.21, up from 0.95 earlier this month.  CBC is unremarkable and troponin is undetectable.  Urinalysis appears normal.  Neurology evaluated the patient in the emergency department and notes that the right-sided deficits are from her remote strokes and suspects her current presentation, encephalopathy, is secondary to benzodiazepine.  Medical admission for further observation was recommended.  Review of Systems:  Unable to complete ROS secondary to patient's clinical condition.  Past Medical History:  Diagnosis Date  . Abnormal nuclear  stress test 06/21/2011   Inferolateral reversible defect;--> cardiac cath & PCI of CxOM, occluded RCA with collaterals;; followup Myoview 11/2012: Low risk. Fixed basal inferior artifact normal EF. No ischemia  . Anxiety    When necessary Xanax  . Anxiety   . Asthma   . Bilateral cataracts    Status post stroke or correction  . CAD S/P percutaneous coronary angioplasty 06/2011   s/P PCI to proximal OM1 w/ DES; occluded RCA with bridging and L-R collaterals (medical management)  . Chronic kidney disease (CKD), stage II (mild)    Related to current bladder infections (although diabetes cannot be excluded)  . Chronotropic incompetence with sinus node dysfunction [facility]) October 2013   On CPET test; beta blockers reduced  . Diabetes mellitus type 2, controlled ([place])    On oral medications  . Diverticulitis   . Essential hypertension    Allowing for permissive hypertension to avoid orthostatic hypotension  . GERD (gastroesophageal reflux disease)    On PPI  . Glaucoma   . History of syncope    Per EP - neurocardiogenic & not Bradycardia related (no PPM)  . History of unstable angina 06/13/2011   Jaw pain awakening from sleep -- Myoview --CATH --> PCI  . Hyperlipidemia with target LDL less than 70    HDL at goal, LDL not at goal, borderline triglycerides. On Crestor 20 mg  . Migraines   . OSA (obstructive sleep apnea)    hx bladder infections  . Osteoarthritis   . PAF (paroxysmal atrial fibrillation) ([place]) 10-11/ 2014   CardioNet Event Monitor: NSR & S Brady -- Rates 50-100; Total A. fib burden 35 hours and 27 minutes.  1296 episodes, longest was 1 hour 29 minutes. Rate ranged from 52-169 beats per minute.  . Seasonal allergies   . Shortness of breath on exertion October 2013   2-D echo: Normal EF>55%, Gr 1DD, mild aortic sclerosis; Evaluated with CPET - peak VO2 97%; Chronotropic Incompetence (submaximal effort)  . Spinal stenosis of lumbar region 11/2011  . Stroke ([place])   .  Tachycardia-bradycardia syndrome ([place]) 11/2012  . Urge incontinence     Past Surgical History:  Procedure Laterality Date  . APPENDECTOMY    . BREAST BIOPSY     both breast  . cataracts     both eyes  . COLONOSCOPY    . CORONARY ANGIOPLASTY WITH STENT PLACEMENT  07/05/2011   Promus Element DES 2.5 mm x 16 mm-post dilated to 2.65 mm CX-Proximal OM1  . Holter Monitor  04/2015   Sinus rhythm with rates 52-149 BPM. Isolated PACs with rare couplets and bigeminy. Multiple short runs of PAT/PSVT. Arrhythmia run 120 bpm for 204 beats. 14% of time was in A. fib/flutter. - Reviewed by Dr. Caryl Comes. Not thought to be significant enough for her syncope.  Marland Kitchen KNEE ARTHROSCOPY WITH MEDIAL MENISECTOMY Right 06/24/2014   Procedure: RIGHT KNEE ARTHROSCOPY WITH partial lateral MENISECTOMY, abrasion chondroplasty of medial femoral condyle and patella, microfracture technique;  Surgeon: Latanya Maudlin, MD;  Location: [facility]S;  Service: Orthopedics;  Laterality: Right;  . LEFT HEART CATHETERIZATION WITH CORONARY ANGIOGRAM N/A 07/06/2011   Procedure: LEFT HEART CATHETERIZATION WITH CORONARY ANGIOGRAM;  Surgeon: Leonie Man, MD;  Location: [facility] CATH LAB: Unstable Angina --> Myoview wiht Inf-Lat Ischemia.  Proximal OM1 lesion --> PCI; 100% mid RCA with right to right and left to right bridging collaterals from circumflex RPL and LAD the PDA.  Marland Kitchen NM MYOVIEW LTD  October 2014    Low risk. Fixed basal inferior artifact normal EF. No ischemia --> (as compared to pre-PCI Myoview revealing inferolateral ischemia)  . rotator cuff Right   . TRANSTHORACIC ECHOCARDIOGRAM  11/2014   EF 65-70%, Mod LVH. Normal wall motion. Gr 1 DD. Normal valves.  Marland Kitchen VAGINAL HYSTERECTOMY       reports that she has never smoked. She has never used smokeless tobacco. She reports that she does not drink alcohol or use drugs.  Allergies  Allergen Reactions  . Amlodipine Cough  . Clopidogrel Bisulfate Cough  . Kenalog [Triamcinolone Acetonide] Other  (See Comments)    unknown  . Lisinopril Cough  . Losartan Cough  . Sulfa Antibiotics Hives and Rash    Family History  Problem Relation Age of Onset  . COPD Mother   . Pulmonary fibrosis Mother   . Heart attack Father   . Heart disease Father   . Stroke Father   . Cancer Sister   . Cancer Sister   . Clotting disorder Sister   . Heart attack Sister   . Arthritis Unknown   . Breast cancer Sister   . Colon cancer Neg Hx   . Esophageal cancer Neg Hx   . Stomach cancer Neg Hx      Prior to Admission medications   Medication Sig Start Date End Date Taking? Authorizing Provider  ALPRAZolam (XANAX) 0.25 MG tablet TAKE 1 TABLET BY MOUTH AT BEDTIME AS NEEDED SLEEP 04/25/17  Yes Gouru, Aruna, MD  azelastine (ASTELIN) 0.1 % nasal spray Place 1-2 sprays into both nostrils at bedtime. Use in each nostril as directed   Yes [provider]  docusate sodium (STOOL SOFTENER) 100 MG  capsule Take 100 mg by mouth at bedtime.    Yes [provider]  estradiol (ESTRACE VAGINAL) 0.1 MG/GM vaginal cream Apply 0.5mg  (pea-sized amount)  just inside the vaginal introitus with a finger-tip on Monday, Wednesday and Friday nights. 06/04/17  Yes McGowan, Larene Beach A, PA-C  fish oil-omega-3 fatty acids 1000 MG capsule Take 1 g by mouth daily.    Yes [provider]  furosemide (LASIX) 20 MG tablet Take 2 tablets (40 mg total) by mouth daily. 04/09/17  Yes Leonie Man, MD  gabapentin (NEURONTIN) 300 MG capsule Take 300 mg by mouth 3 (three) times daily.    Yes [provider]  HUMALOG KWIKPEN 100 UNIT/ML KiwkPen Inject 22 Units into the skin 3 (three) times daily with meals.  05/28/17  Yes [provider]  Insulin Glargine (TOUJEO MAX SOLOSTAR) 300 UNIT/ML SOPN Inject 85 Units into the skin at bedtime.   Yes [provider]  levocetirizine (XYZAL) 5 MG tablet Take 2.5 mg by mouth every other day.    Yes [provider]  LUMIGAN 0.01 % SOLN Place 1 drop  into both eyes at bedtime.    Yes [provider]  Magnesium 500 MG CAPS Take 1,000 mg by mouth at bedtime.    Yes [provider]  metFORMIN (GLUCOPHAGE-XR) 500 MG 24 hr tablet Take 500 mg by mouth daily with breakfast.   Yes [provider]  metoprolol tartrate (LOPRESSOR) 25 MG tablet Take 1 tablet (25 mg total) by mouth 2 (two) times daily. 07/08/17  Yes Epifanio Lesches, MD  mirabegron ER (MYRBETRIQ) 50 MG TB24 tablet Take 50 mg by mouth daily.    Yes [provider]  nitroGLYCERIN (NITROSTAT) 0.4 MG SL tablet Place 0.4 mg under the tongue every 5 (five) minutes as needed for chest pain.   Yes [provider]  nystatin cream (MYCOSTATIN) Apply 1 application topically daily. 12/12/16  Yes Leone Haven, MD  pantoprazole (PROTONIX) 40 MG tablet Take 1 tablet (40 mg total) by mouth daily. 04/05/17  Yes Leone Haven, MD  polyethylene glycol [facility] / GLYCOLAX) packet Take 17 g by mouth daily as needed for moderate constipation. 04/25/17  Yes Gouru, Illene Silver, MD  Rivaroxaban (XARELTO) 15 MG TABS tablet Take 15 mg by mouth every evening.   Yes [provider]  rOPINIRole (REQUIP) 0.5 MG tablet TAKE 1 TABLET BY MOUTH AT  BEDTIME 05/30/17  Yes Leone Haven, MD  sitaGLIPtin (JANUVIA) 50 MG tablet Take 50 mg by mouth daily.    Yes [provider]  timolol (TIMOPTIC) 0.5 % ophthalmic solution Place 1 drop into both eyes every morning.   Yes [provider]  traMADol (ULTRAM) 50 MG tablet Take 1 tablet (50 mg total) by mouth 2 (two) times daily. 04/25/17  Yes Nicholes Mango, MD    Physical Exam: Vitals:   07/21/17 2230 07/21/17 2300 07/21/17 2330 07/21/17 2345  BP: (!) 158/79 (!) 166/73 (!) 174/66 (!) 173/73  Pulse: 84 80 80 85  Resp: 13 15 11 16   Temp:      TempSrc:      SpO2: 96% 93% 95% 95%      Constitutional: NAD, obese, somnolent Eyes: PERTLA, lids and conjunctivae normal ENMT: Mucous membranes are moist.  Posterior pharynx clear of any exudate or lesions.   Neck: normal, supple, no masses, no thyromegaly Respiratory: clear to auscultation bilaterally, no wheezing, no crackles. Normal respiratory effort.    Cardiovascular: S1 & S2 heard,  regular rate and rhythm. No significant JVD. Abdomen: No distension, no tenderness, soft. Bowel sounds normal.  Musculoskeletal: no clubbing / cyanosis. No joint deformity upper and lower extremities.    Skin: no significant rashes, lesions, ulcers. Warm, dry, well-perfused. Neurologic: Right lower facial weakness. PERRL. Sensation to light touch intact. Moving all extremities spontaneously.  Psychiatric: Somnolent, easily roused. Anxious, tearful.     Labs on Admission: I have personally reviewed following labs and imaging studies  CBC: Recent Labs  Lab 07/21/17 1743 07/21/17 1749  WBC 10.1  --   NEUTROABS 7.6  --   HGB 13.8 14.6  HCT 43.3 43.0  MCV 94.5  --   PLT 323  --    Basic Metabolic Panel: Recent Labs  Lab 07/21/17 1743 07/21/17 1749  NA 136 139  K 4.2 4.2  CL 95* 94*  CO2 28  --   GLUCOSE 221* 218*  BUN 18 20  CREATININE 1.21* 1.00  CALCIUM 9.7  --    GFR: CrCl cannot be calculated (Unknown ideal weight.). Liver Function Tests: Recent Labs  Lab 07/21/17 1743  AST 23  ALT 25  ALKPHOS 85  BILITOT 0.4  PROT 7.6  ALBUMIN 3.6   No results for input(s): LIPASE, AMYLASE in the last 168 hours. No results for input(s): AMMONIA in the last 168 hours. Coagulation Profile: Recent Labs  Lab 07/21/17 1743  INR 1.13   Cardiac Enzymes: No results for input(s): CKTOTAL, CKMB, CKMBINDEX, TROPONINI in the last 168 hours. BNP (last 3 results) No results for input(s): PROBNP in the last 8760 hours. HbA1C: No results for input(s): HGBA1C in the last 72 hours. CBG: Recent Labs  Lab 07/21/17 1741 07/21/17 2304  GLUCAP 207* 183*   Lipid Profile: No results for input(s): CHOL, HDL, LDLCALC, TRIG, CHOLHDL, LDLDIRECT in the last  72 hours. Thyroid Function Tests: No results for input(s): TSH, T4TOTAL, FREET4, T3FREE, THYROIDAB in the last 72 hours. Anemia Panel: No results for input(s): VITAMINB12, FOLATE, FERRITIN, TIBC, IRON, RETICCTPCT in the last 72 hours. Urine analysis:    Component Value Date/Time   COLORURINE STRAW (A) 07/21/2017 2232   APPEARANCEUR CLEAR 07/21/2017 2232   APPEARANCEUR Cloudy (A) 06/04/2017 1507   LABSPEC 1.017 07/21/2017 2232   PHURINE 8.0 07/21/2017 2232   GLUCOSEU NEGATIVE 07/21/2017 2232   GLUCOSEU NEGATIVE 08/19/2015 1630   HGBUR NEGATIVE 07/21/2017 2232   BILIRUBINUR NEGATIVE 07/21/2017 2232   BILIRUBINUR Negative 06/04/2017 [place] 07/21/2017 2232   PROTEINUR NEGATIVE 07/21/2017 2232   UROBILINOGEN 0.2 04/12/2017 1355   UROBILINOGEN 0.2 08/19/2015 1630   NITRITE NEGATIVE 07/21/2017 2232   LEUKOCYTESUR TRACE (A) 07/21/2017 2232   LEUKOCYTESUR 3+ (A) 06/04/2017 1507   Sepsis Labs: @LABRCNTIP (procalcitonin:4,lacticidven:4) )No results found for this or any previous visit (from the past 240 hour(s)).   Radiological Exams on Admission: Ct Angio Head W Or Wo Contrast  Result Date: 07/21/2017 CLINICAL DATA:  New onset right-sided weakness. EXAM: CT ANGIOGRAPHY HEAD AND NECK TECHNIQUE: Multidetector CT imaging of the head and neck was performed using the standard protocol during bolus administration of intravenous contrast. Multiplanar CT image reconstructions and MIPs were obtained to evaluate the vascular anatomy. Carotid stenosis measurements (when applicable) are obtained utilizing NASCET criteria, using the distal internal carotid diameter as the denominator. CONTRAST:  14mL ISOVUE-370 IOPAMIDOL (ISOVUE-370) INJECTION 76% COMPARISON:  CT head without contrast 07/21/2017 and 01/20/2017. MRI brain and MRA head 05/18/2015. FINDINGS: CTA NECK FINDINGS Aortic arch: A 3 vessel arch configuration  is present. Atherosclerotic calcifications are present origin of the left  subclavian artery without a significant stenosis. Right carotid system: The right common carotid artery is within normal limits. Bifurcation is unremarkable. There is tortuosity of the cervical right ICA. Irregularity is present in the mid cervical right ICA without significant stenosis. Left carotid system: The left common carotid artery is within normal limits. The bifurcation unremarkable. There is focal irregularity of the mid left cervical ICA. No significant luminal stenosis is present. Overall caliber of the left internal carotid artery is smaller than the right. Vertebral arteries: The right vertebral artery is slightly dominant to the left. Both vertebral arteries originate from the subclavian arteries without significant stenosis. There is no significant stenosis or focal vessel irregularity within either vertebral artery in the neck. Skeleton: Endplate degenerative changes are evident from C3-4 through C6-7. Osseous foraminal narrowing is greatest on the left at C5-6 and on the right at C4-5 and C6-7. Other neck: Multiple hypodense thyroid nodules are present. These measure less than 10 mm. There is no dominant lesion. No significant cervical adenopathy is present. Salivary glands are within normal limits. No focal mucosal or submucosal lesions are evident. Vocal cords are midline and symmetric. Upper chest: Scattered ground-glass attenuation is present in the lung is bilaterally. No focal airspace consolidation is present. There is no nodule or mass lesion. Review of the MIP images confirms the above findings CTA HEAD FINDINGS Anterior circulation: No atherosclerotic calcifications are present within the cavernous internal carotid arteries bilaterally without significant stenosis. The ICA termini are within normal limits bilaterally. The A1 and M1 segments are normal. The anterior communicating artery is present. MCA bifurcations are within normal limits. The proximal MCA branch vessels are within normal  limits bilaterally. There is some distal attenuation bilaterally. No significant proximal stenosis or occlusion is present. There is moderate focal stenosis within the mid left A2 segment. There is some attenuation of distal ACA branch vessels bilaterally. Posterior circulation: The right vertebral artery is slightly dominant. PICA origins are visualized and normal. The basilar artery is normal. Both posterior cerebral arteries originate from basilar tip. A moderate stenosis is present in the right P2 segment. There is asymmetric attenuation of PCA branch vessels on the right. No significant proximal branch vessel occlusion is present. Venous sinuses: The dural sinuses are patent. The straight sinus and deep cerebral veins are intact. Cortical veins are unremarkable. Anatomic variants: None Review of the MIP images confirms the above findings IMPRESSION: 1. Atherosclerotic changes at the origin of the left subclavian artery without significant stenosis in the neck. 2. Vessel irregularity in the mid cervical segments bilaterally, left more prominent than right. Findings are consistent with fibromuscular dysplasia. 3. Atherosclerotic changes within the cavernous internal carotid arteries bilaterally without a significant stenosis. 4. No significant proximal stenosis, aneurysm, or branch vessel occlusion within the circle-of-Willis. 5. Moderate left A2/A3 segment stenosis. 6. Mild multilevel degenerative changes of the cervical spine. 7. Ground-glass attenuation the lung apices bilaterally likely reflects edema or atelectasis. Electronically Signed   By: San Morelle M.D.   On: 07/21/2017 18:41   Dg Tibia/fibula Right  Result Date: 07/21/2017 CLINICAL DATA:  Right leg pain.  Patient is unresponsive. EXAM: RIGHT TIBIA AND FIBULA - 2 VIEW COMPARISON:  None. FINDINGS: Right tibia and fibula appear intact. No evidence of acute fracture or dislocation. No focal bone lesion or bone destruction. Soft tissues are  unremarkable. IMPRESSION: No acute bony abnormalities. Electronically Signed   By: Oren Beckmann.D.  On: 07/21/2017 21:16   Ct Angio Neck W Or Wo Contrast  Result Date: 07/21/2017 CLINICAL DATA:  New onset right-sided weakness. EXAM: CT ANGIOGRAPHY HEAD AND NECK TECHNIQUE: Multidetector CT imaging of the head and neck was performed using the standard protocol during bolus administration of intravenous contrast. Multiplanar CT image reconstructions and MIPs were obtained to evaluate the vascular anatomy. Carotid stenosis measurements (when applicable) are obtained utilizing NASCET criteria, using the distal internal carotid diameter as the denominator. CONTRAST:  78mL ISOVUE-370 IOPAMIDOL (ISOVUE-370) INJECTION 76% COMPARISON:  CT head without contrast 07/21/2017 and 01/20/2017. MRI brain and MRA head 05/18/2015. FINDINGS: CTA NECK FINDINGS Aortic arch: A 3 vessel arch configuration is present. Atherosclerotic calcifications are present origin of the left subclavian artery without a significant stenosis. Right carotid system: The right common carotid artery is within normal limits. Bifurcation is unremarkable. There is tortuosity of the cervical right ICA. Irregularity is present in the mid cervical right ICA without significant stenosis. Left carotid system: The left common carotid artery is within normal limits. The bifurcation unremarkable. There is focal irregularity of the mid left cervical ICA. No significant luminal stenosis is present. Overall caliber of the left internal carotid artery is smaller than the right. Vertebral arteries: The right vertebral artery is slightly dominant to the left. Both vertebral arteries originate from the subclavian arteries without significant stenosis. There is no significant stenosis or focal vessel irregularity within either vertebral artery in the neck. Skeleton: Endplate degenerative changes are evident from C3-4 through C6-7. Osseous foraminal narrowing is  greatest on the left at C5-6 and on the right at C4-5 and C6-7. Other neck: Multiple hypodense thyroid nodules are present. These measure less than 10 mm. There is no dominant lesion. No significant cervical adenopathy is present. Salivary glands are within normal limits. No focal mucosal or submucosal lesions are evident. Vocal cords are midline and symmetric. Upper chest: Scattered ground-glass attenuation is present in the lung is bilaterally. No focal airspace consolidation is present. There is no nodule or mass lesion. Review of the MIP images confirms the above findings CTA HEAD FINDINGS Anterior circulation: No atherosclerotic calcifications are present within the cavernous internal carotid arteries bilaterally without significant stenosis. The ICA termini are within normal limits bilaterally. The A1 and M1 segments are normal. The anterior communicating artery is present. MCA bifurcations are within normal limits. The proximal MCA branch vessels are within normal limits bilaterally. There is some distal attenuation bilaterally. No significant proximal stenosis or occlusion is present. There is moderate focal stenosis within the mid left A2 segment. There is some attenuation of distal ACA branch vessels bilaterally. Posterior circulation: The right vertebral artery is slightly dominant. PICA origins are visualized and normal. The basilar artery is normal. Both posterior cerebral arteries originate from basilar tip. A moderate stenosis is present in the right P2 segment. There is asymmetric attenuation of PCA branch vessels on the right. No significant proximal branch vessel occlusion is present. Venous sinuses: The dural sinuses are patent. The straight sinus and deep cerebral veins are intact. Cortical veins are unremarkable. Anatomic variants: None Review of the MIP images confirms the above findings IMPRESSION: 1. Atherosclerotic changes at the origin of the left subclavian artery without significant  stenosis in the neck. 2. Vessel irregularity in the mid cervical segments bilaterally, left more prominent than right. Findings are consistent with fibromuscular dysplasia. 3. Atherosclerotic changes within the cavernous internal carotid arteries bilaterally without a significant stenosis. 4. No significant proximal stenosis, aneurysm, or branch  vessel occlusion within the circle-of-Willis. 5. Moderate left A2/A3 segment stenosis. 6. Mild multilevel degenerative changes of the cervical spine. 7. Ground-glass attenuation the lung apices bilaterally likely reflects edema or atelectasis. Electronically Signed   By: San Morelle M.D.   On: 07/21/2017 18:41   Dg Knee Complete 4 Views Right  Result Date: 07/21/2017 CLINICAL DATA:  Right leg pain.  Patient is unresponsive. EXAM: RIGHT KNEE - COMPLETE 4+ VIEW COMPARISON:  None. FINDINGS: Mild degenerative changes in the right knee with medial compartment narrowing. No evidence of acute fracture or dislocation. No focal bone lesion or bone destruction. Bone cortex appears intact. No significant effusion. Soft tissues are unremarkable. IMPRESSION: Mild degenerative changes in the right knee. No acute bony abnormalities. Electronically Signed   By: Lucienne Capers M.D.   On: 07/21/2017 21:16   Dg Hip Unilat W Or Wo Pelvis 2-3 Views Right  Result Date: 07/21/2017 CLINICAL DATA:  Right leg pain.  Patient is unresponsive. EXAM: DG HIP (WITH OR WITHOUT PELVIS) 2-3V RIGHT COMPARISON:  None. FINDINGS: Severe degenerative changes in the right hip with loss of superior acetabular joint space and bone-on-bone appearance. There is remodeling and sclerosis of the right femoral head. This may indicate severe degenerative changes or avascular necrosis. No evidence of acute fracture or dislocation of the pelvis or right hip. SI joints and symphysis pubis are not displaced. Residual contrast material in the bladder. Degenerative changes in the lower lumbar spine and in the  left hip. Vascular calcifications. IMPRESSION: Severe degenerative changes in the right hip. Remodeling and sclerosis of the right femoral head may indicate severe degenerative changes or avascular necrosis. No acute fracture or dislocation. Electronically Signed   By: Lucienne Capers M.D.   On: 07/21/2017 21:15   Ct Head Code Stroke Wo Contrast  Result Date: 07/21/2017 CLINICAL DATA:  Code stroke. New onset of right-sided weakness. The patient is on blood thinners. EXAM: CT HEAD WITHOUT CONTRAST TECHNIQUE: Contiguous axial images were obtained from the base of the skull through the vertex without intravenous contrast. COMPARISON:  CT head without contrast 01/20/2017. FINDINGS: Brain: Paraventricular white matter disease similar the prior exam. A remote lacunar infarct of the right internal capsule is stable. Remote lacunar infarcts are present in the thalami bilaterally. A remote lacunar infarct is present in the left caudate head. No new infarcts are present. The insular cortex is intact. The brainstem and cerebellum are normal. Vascular: Atherosclerotic calcifications are present within the cavernous internal carotid arteries bilaterally. There is no hyperdense vessel. Skull: Calvarium is intact. Sinuses/Orbits: A fluid level is present within the sphenoid sinuses. Paranasal sinuses are otherwise clear. The mastoid air cells are clear. ASPECTS ([place] Stroke Program Early CT Score) - Ganglionic level infarction (caudate, lentiform nuclei, internal capsule, insula, M1-M3 cortex): 7/7 - Supraganglionic infarction (M4-M6 cortex): 3/3 Total score (0-10 with 10 being normal): 10/10 IMPRESSION: 1. Stable appearance of remote lacunar infarcts involving the basal ganglia bilaterally and the right internal capsule. 2. No acute intracranial abnormality. 3. Stable atrophy and white matter disease. This likely reflects the sequela of chronic microvascular ischemia. 4. Sphenoid sinus disease. 5. ASPECTS is 10/10 The  above was relayed via text pager to Dr. Lorraine Lax on 07/21/2017 at 17:59 . Electronically Signed   By: San Morelle M.D.   On: 07/21/2017 18:00    EKG: Independently reviewed. Sinus rhythm.   Assessment/Plan  1. Acute encephalopathy  - Presents from SNF with acute somnolence and difficulty with speech; code stroke was called  d/t right facial droop and right-sided weakness, but daughter confirmed these findings are chronic  - Head CT negative for acute findings  - Neuro consulting and much appreciated; current condition likely secondary to benzodiazepine rather than CVA or seizure but hospital observation recommended with EEG if fails to improve and MRI brain if new deficits detected  - Continue frequent neuro checks, continue supportive care, keep NPO until she becomes more alert    2. Hx of CVA with residual motor deficits  - Hx of CVA with right-sided motor deficits  - Head CT is negative for acute findings and old strokes appear stable  - Per neuro recs, consider MRI if new deficits emerge   3. Paroxysmal atrial fibrillation  - In a sinus rhythm on admission  - CHADS-VASc is 17 (age x2, gender, CHF, CVA x2, DM)  - Continue Xarelto and metoprolol    4. Chronic diastolic CHF  - Appears well-compensated  - Lasix held on admission as she is NPO and creatinine is increased  - Follow daily wts, continue beta-blocker    5. CAD - No anginal complaints, troponin undetectable   - Continue beta-blocker    6. Insulin-dependent DM  - A1c was 8.4% last October  - Managed at home with metformin, Januvia, Toujeo 85 units qHS, and Humalog 22 units TID  - Follow CBG's, start with Lantus 50 units qHS and Novolog per sliding-scale    7. CKD stage II  - SCr is 1.21 on admission, slightly up from her apparent baseline  - Hold Lasix initially, avoid nephrotoxins, repeat chemistries in am     DVT prophylaxis: Xarelto  Code Status: DNR  Family Communication: Discussed with patient  Consults  called: Neurology Admission status: Obseravation    Vianne Bulls, MD [name] Pager [phone]  If 7PM-7AM, please contact night-coverage [email or web address] Password [email or web address]  07/22/2017, 12:02 AM

## 2017-07-22 NOTE — Progress Notes (Signed)
Neurology Progress Note Chief Complaint: "I s+++ my britches"   HPI: Abigail Boyd is an 84 y.o. female who has been in short term rehab for reconditioning and IV abx for UTI due to baseline debility post stroke in 2016. Baseline mRS 4, as she needs help with all ADLs, uses a walker or w/c currently per facility. The pt was given Xanex after her therapy on the afternoon of 5/25.  When pts daughter arrived for visit, she was not talking or responding normally and the daughter called EMS. Due to residual rt side weakness and facial droop, EMS called a pre-arrival code stroke. Upon arrival she was unable to answer questions, lethargic and dysarthric speech. She had a right side facial droop and not using right side as briskly.   CTH shows sevral old bilateral infarcts. No acute infarcts seen nor LVO. Thus no acute stroke treatments offered. Once daughter arrived to the ER, it was confirmed that she has chronic right side facial droop and weakness from remote stroke.Daughter notes that the rt side seems weaker when tired or sick.   Subjective: Pt is now alert, taking and wants to go home. She appears anxious, depressed, tearful and tells me she is "sad". No new focal deficits and right side seems equal to left today  Reviewed Home meds, allergies and Hospital Medications  ROS:  Denies CP, SOB, Pain, H/A.  Psych: anxiety, depression, pt stating she feels "sad" and is tearful All other systems are neg   Physical Examination:  Vitals:   07/22/17 0945 07/22/17 1030 07/22/17 1056 07/22/17 1115  BP: (!) 187/93 (!) 184/96 (!) 184/96 (!) 185/84  Pulse: (!) 105 (!) 107 (!) 104 74  Resp: 17 11 11 11   Temp:      TempSrc:      SpO2: 95% 94% 96% 100%  Weight:      Height:        General - debilitated, obese pt Heart - Regular rate and rhythm - no murmer appreciated Lungs - Clear to auscultation Abdomen - Soft - non tender Extremities - Distal pulses intact - no edema Skin - Warm and dry Psych-  crying, depressed, anxious  Neurologic Examination:  Mental Status: Alert, answers all orientation questions correctly, but begins to cry and sob during exam and as her voice quivers, she begins to "baby talk".  Follows all commands Cranial Nerves: II: Discs not visualized; Visual fields grossly normal, pupils equal, round, reactive to light III,IV, VI: ptosis not present, extra-ocular motions intact bilaterally V,VII: smile asymmetric with slight right side droop, facial light touch sensation normal bilaterally VIII: hearing normal bilaterally IX,X: gag reflex present XI: bilateral shoulder shrug XII: midline tongue extension Motor:  RUE - 5/5    LUE - 5/5   RLE - 5/5    LLE - 5/5 Tone and bulk:normal tone throughout; no atrophy noted Sensory: Light touch intact throughout, bilaterally Deep Tendon Reflexes: 2+ and symmetric throughout Plantars: Right: downgoing   Left: downgoing Cerebellar: unable Gait: unable at baselin   LABORATORY STUDIES:  Basic Metabolic Panel: Recent Labs  Lab 07/21/17 1743 07/21/17 1749  NA 136 139  K 4.2 4.2  CL 95* 94*  CO2 28  --   GLUCOSE 221* 218*  BUN 18 20  CREATININE 1.21* 1.00  CALCIUM 9.7  --     CBC: Recent Labs  Lab 07/21/17 1743 07/21/17 1749  WBC 10.1  --   NEUTROABS 7.6  --   HGB 13.8 14.6  HCT  43.3 43.0  MCV 94.5  --   PLT 323  --    CBG: Recent Labs  Lab 07/21/17 1741 07/21/17 2304 07/22/17 0057 07/22/17 0549 07/22/17 0722  GLUCAP 207* 183* 182* 209* 249*    Coagulation Studies: Recent Labs    07/21/17 1743  LABPROT 14.4  INR 1.13    Urinalysis:  Recent Labs  Lab 07/21/17 2232  COLORURINE STRAW*  LABSPEC 1.017  PHURINE 8.0  GLUCOSEU NEGATIVE  HGBUR NEGATIVE  BILIRUBINUR NEGATIVE  KETONESUR NEGATIVE  PROTEINUR NEGATIVE  NITRITE NEGATIVE  LEUKOCYTESUR TRACE*    Lipid Panel:     Component Value Date/Time   CHOL 212 (H) 05/18/2015 1219   CHOL 177 12/18/2012 0941   TRIG 225.0 (H)  05/18/2015 1219   HDL 54.20 05/18/2015 1219   HDL 50 12/18/2012 0941   CHOLHDL 4 05/18/2015 1219   VLDL 45.0 (H) 05/18/2015 1219   LDLCALC 98 12/27/2014 0735   LDLCALC 90 12/18/2012 0941    HgbA1C:  Lab Results  Component Value Date   HGBA1C 8.4 (H) 12/12/2016   Miscellaneous labs:  EKG  EKG   IMAGING: Ct Angio Head W Or Wo Contrast  Result Date: 07/21/2017 CLINICAL DATA:  New onset right-sided weakness. EXAM: CT ANGIOGRAPHY HEAD AND NECK TECHNIQUE: Multidetector CT imaging of the head and neck was performed using the standard protocol during bolus administration of intravenous contrast. Multiplanar CT image reconstructions and MIPs were obtained to evaluate the vascular anatomy. Carotid stenosis measurements (when applicable) are obtained utilizing NASCET criteria, using the distal internal carotid diameter as the denominator. CONTRAST:  54mL ISOVUE-370 IOPAMIDOL (ISOVUE-370) INJECTION 76% COMPARISON:  CT head without contrast 07/21/2017 and 01/20/2017. MRI brain and MRA head 05/18/2015. FINDINGS: CTA NECK FINDINGS Aortic arch: A 3 vessel arch configuration is present. Atherosclerotic calcifications are present origin of the left subclavian artery without a significant stenosis. Right carotid system: The right common carotid artery is within normal limits. Bifurcation is unremarkable. There is tortuosity of the cervical right ICA. Irregularity is present in the mid cervical right ICA without significant stenosis. Left carotid system: The left common carotid artery is within normal limits. The bifurcation unremarkable. There is focal irregularity of the mid left cervical ICA. No significant luminal stenosis is present. Overall caliber of the left internal carotid artery is smaller than the right. Vertebral arteries: The right vertebral artery is slightly dominant to the left. Both vertebral arteries originate from the subclavian arteries without significant stenosis. There is no significant  stenosis or focal vessel irregularity within either vertebral artery in the neck. Skeleton: Endplate degenerative changes are evident from C3-4 through C6-7. Osseous foraminal narrowing is greatest on the left at C5-6 and on the right at C4-5 and C6-7. Other neck: Multiple hypodense thyroid nodules are present. These measure less than 10 mm. There is no dominant lesion. No significant cervical adenopathy is present. Salivary glands are within normal limits. No focal mucosal or submucosal lesions are evident. Vocal cords are midline and symmetric. Upper chest: Scattered ground-glass attenuation is present in the lung is bilaterally. No focal airspace consolidation is present. There is no nodule or mass lesion. Review of the MIP images confirms the above findings CTA HEAD FINDINGS Anterior circulation: No atherosclerotic calcifications are present within the cavernous internal carotid arteries bilaterally without significant stenosis. The ICA termini are within normal limits bilaterally. The A1 and M1 segments are normal. The anterior communicating artery is present. MCA bifurcations are within normal limits. The proximal MCA branch vessels  are within normal limits bilaterally. There is some distal attenuation bilaterally. No significant proximal stenosis or occlusion is present. There is moderate focal stenosis within the mid left A2 segment. There is some attenuation of distal ACA branch vessels bilaterally. Posterior circulation: The right vertebral artery is slightly dominant. PICA origins are visualized and normal. The basilar artery is normal. Both posterior cerebral arteries originate from basilar tip. A moderate stenosis is present in the right P2 segment. There is asymmetric attenuation of PCA branch vessels on the right. No significant proximal branch vessel occlusion is present. Venous sinuses: The dural sinuses are patent. The straight sinus and deep cerebral veins are intact. Cortical veins are  unremarkable. Anatomic variants: None Review of the MIP images confirms the above findings IMPRESSION: 1. Atherosclerotic changes at the origin of the left subclavian artery without significant stenosis in the neck. 2. Vessel irregularity in the mid cervical segments bilaterally, left more prominent than right. Findings are consistent with fibromuscular dysplasia. 3. Atherosclerotic changes within the cavernous internal carotid arteries bilaterally without a significant stenosis. 4. No significant proximal stenosis, aneurysm, or branch vessel occlusion within the circle-of-Willis. 5. Moderate left A2/A3 segment stenosis. 6. Mild multilevel degenerative changes of the cervical spine. 7. Ground-glass attenuation the lung apices bilaterally likely reflects edema or atelectasis. Electronically Signed   By: San Morelle M.D.   On: 07/21/2017 18:41   Dg Tibia/fibula Right  Result Date: 07/21/2017 CLINICAL DATA:  Right leg pain.  Patient is unresponsive. EXAM: RIGHT TIBIA AND FIBULA - 2 VIEW COMPARISON:  None. FINDINGS: Right tibia and fibula appear intact. No evidence of acute fracture or dislocation. No focal bone lesion or bone destruction. Soft tissues are unremarkable. IMPRESSION: No acute bony abnormalities. Electronically Signed   By: Lucienne Capers M.D.   On: 07/21/2017 21:16   Ct Angio Neck W Or Wo Contrast  Result Date: 07/21/2017 CLINICAL DATA:  New onset right-sided weakness. EXAM: CT ANGIOGRAPHY HEAD AND NECK TECHNIQUE: Multidetector CT imaging of the head and neck was performed using the standard protocol during bolus administration of intravenous contrast. Multiplanar CT image reconstructions and MIPs were obtained to evaluate the vascular anatomy. Carotid stenosis measurements (when applicable) are obtained utilizing NASCET criteria, using the distal internal carotid diameter as the denominator. CONTRAST:  56mL ISOVUE-370 IOPAMIDOL (ISOVUE-370) INJECTION 76% COMPARISON:  CT head without  contrast 07/21/2017 and 01/20/2017. MRI brain and MRA head 05/18/2015. FINDINGS: CTA NECK FINDINGS Aortic arch: A 3 vessel arch configuration is present. Atherosclerotic calcifications are present origin of the left subclavian artery without a significant stenosis. Right carotid system: The right common carotid artery is within normal limits. Bifurcation is unremarkable. There is tortuosity of the cervical right ICA. Irregularity is present in the mid cervical right ICA without significant stenosis. Left carotid system: The left common carotid artery is within normal limits. The bifurcation unremarkable. There is focal irregularity of the mid left cervical ICA. No significant luminal stenosis is present. Overall caliber of the left internal carotid artery is smaller than the right. Vertebral arteries: The right vertebral artery is slightly dominant to the left. Both vertebral arteries originate from the subclavian arteries without significant stenosis. There is no significant stenosis or focal vessel irregularity within either vertebral artery in the neck. Skeleton: Endplate degenerative changes are evident from C3-4 through C6-7. Osseous foraminal narrowing is greatest on the left at C5-6 and on the right at C4-5 and C6-7. Other neck: Multiple hypodense thyroid nodules are present. These measure less than 10 mm.  There is no dominant lesion. No significant cervical adenopathy is present. Salivary glands are within normal limits. No focal mucosal or submucosal lesions are evident. Vocal cords are midline and symmetric. Upper chest: Scattered ground-glass attenuation is present in the lung is bilaterally. No focal airspace consolidation is present. There is no nodule or mass lesion. Review of the MIP images confirms the above findings CTA HEAD FINDINGS Anterior circulation: No atherosclerotic calcifications are present within the cavernous internal carotid arteries bilaterally without significant stenosis. The ICA  termini are within normal limits bilaterally. The A1 and M1 segments are normal. The anterior communicating artery is present. MCA bifurcations are within normal limits. The proximal MCA branch vessels are within normal limits bilaterally. There is some distal attenuation bilaterally. No significant proximal stenosis or occlusion is present. There is moderate focal stenosis within the mid left A2 segment. There is some attenuation of distal ACA branch vessels bilaterally. Posterior circulation: The right vertebral artery is slightly dominant. PICA origins are visualized and normal. The basilar artery is normal. Both posterior cerebral arteries originate from basilar tip. A moderate stenosis is present in the right P2 segment. There is asymmetric attenuation of PCA branch vessels on the right. No significant proximal branch vessel occlusion is present. Venous sinuses: The dural sinuses are patent. The straight sinus and deep cerebral veins are intact. Cortical veins are unremarkable. Anatomic variants: None Review of the MIP images confirms the above findings IMPRESSION: 1. Atherosclerotic changes at the origin of the left subclavian artery without significant stenosis in the neck. 2. Vessel irregularity in the mid cervical segments bilaterally, left more prominent than right. Findings are consistent with fibromuscular dysplasia. 3. Atherosclerotic changes within the cavernous internal carotid arteries bilaterally without a significant stenosis. 4. No significant proximal stenosis, aneurysm, or branch vessel occlusion within the circle-of-Willis. 5. Moderate left A2/A3 segment stenosis. 6. Mild multilevel degenerative changes of the cervical spine. 7. Ground-glass attenuation the lung apices bilaterally likely reflects edema or atelectasis. Electronically Signed   By: San Morelle M.D.   On: 07/21/2017 18:41   Dg Knee Complete 4 Views Right  Result Date: 07/21/2017 CLINICAL DATA:  Right leg pain.  Patient  is unresponsive. EXAM: RIGHT KNEE - COMPLETE 4+ VIEW COMPARISON:  None. FINDINGS: Mild degenerative changes in the right knee with medial compartment narrowing. No evidence of acute fracture or dislocation. No focal bone lesion or bone destruction. Bone cortex appears intact. No significant effusion. Soft tissues are unremarkable. IMPRESSION: Mild degenerative changes in the right knee. No acute bony abnormalities. Electronically Signed   By: Lucienne Capers M.D.   On: 07/21/2017 21:16   Dg Hip Unilat W Or Wo Pelvis 2-3 Views Right  Result Date: 07/21/2017 CLINICAL DATA:  Right leg pain.  Patient is unresponsive. EXAM: DG HIP (WITH OR WITHOUT PELVIS) 2-3V RIGHT COMPARISON:  None. FINDINGS: Severe degenerative changes in the right hip with loss of superior acetabular joint space and bone-on-bone appearance. There is remodeling and sclerosis of the right femoral head. This may indicate severe degenerative changes or avascular necrosis. No evidence of acute fracture or dislocation of the pelvis or right hip. SI joints and symphysis pubis are not displaced. Residual contrast material in the bladder. Degenerative changes in the lower lumbar spine and in the left hip. Vascular calcifications. IMPRESSION: Severe degenerative changes in the right hip. Remodeling and sclerosis of the right femoral head may indicate severe degenerative changes or avascular necrosis. No acute fracture or dislocation. Electronically Signed   By: Gwyndolyn Saxon  Gerilyn Nestle M.D.   On: 07/21/2017 21:15   Ct Head Code Stroke Wo Contrast  Result Date: 07/21/2017 CLINICAL DATA:  Code stroke. New onset of right-sided weakness. The patient is on blood thinners. EXAM: CT HEAD WITHOUT CONTRAST TECHNIQUE: Contiguous axial images were obtained from the base of the skull through the vertex without intravenous contrast. COMPARISON:  CT head without contrast 01/20/2017. FINDINGS: Brain: Paraventricular white matter disease similar the prior exam. A remote  lacunar infarct of the right internal capsule is stable. Remote lacunar infarcts are present in the thalami bilaterally. A remote lacunar infarct is present in the left caudate head. No new infarcts are present. The insular cortex is intact. The brainstem and cerebellum are normal. Vascular: Atherosclerotic calcifications are present within the cavernous internal carotid arteries bilaterally. There is no hyperdense vessel. Skull: Calvarium is intact. Sinuses/Orbits: A fluid level is present within the sphenoid sinuses. Paranasal sinuses are otherwise clear. The mastoid air cells are clear. ASPECTS ([place] Stroke Program Early CT Score) - Ganglionic level infarction (caudate, lentiform nuclei, internal capsule, insula, M1-M3 cortex): 7/7 - Supraganglionic infarction (M4-M6 cortex): 3/3 Total score (0-10 with 10 being normal): 10/10 IMPRESSION: 1. Stable appearance of remote lacunar infarcts involving the basal ganglia bilaterally and the right internal capsule. 2. No acute intracranial abnormality. 3. Stable atrophy and white matter disease. This likely reflects the sequela of chronic microvascular ischemia. 4. Sphenoid sinus disease. 5. ASPECTS is 10/10 The above was relayed via text pager to Dr. Lorraine Lax on 07/21/2017 at 17:59 . Electronically Signed   By: San Morelle M.D.   On: 07/21/2017 18:00      Assessment: 84 y.o. female who is debilitated at baseline post strokes and has recently decompensated d/t UTI and has been in rehab for re-conditioning and IV abx.  # AMS, speech changes and encephalopathy- likely toxic/metabolic, medication effect after Xanex was given. She also has poor neurologic reserve and currently acutely deconditioned in setting of UTI. Now resolving # Cerebral vascular disease and h/o multiple strokes- No new focal deficits at this time. She is at risk for seizures. EEG was considered EEG if she did not clear after meds washed out, but I do not see need for this today as she  seems back to baseline # Depression, Anxiety- She verbalizes 'sadness' and is incessantly crying. Denies any SI. She would benefit from out pt psych f/u. I do not see any medications for depression on home list. Prozac would be excellent choice for stroke pt. (FOCUS trial). Avoid benzodiazapine in elderly pts with cerebral vascular disease. If anxiety, insomnia or behavior become an issue, would favor atypical antipsychotics such as Seroquel 25mg  Qhs.  # baseline debility- currently in rehab as she decompensated to point of not being able to stand with UTI # Afib- chronic anticoagulation. I confirmed with rehab that she has been given her Louanna Raw on time and never missed a dose. # UTI- currently being treated with IV Abx per report.    Recs/Plan:  No further neuro work up at this time  Consider out pt psych consult to f/u on depression, anxiety  Prozac 20mg  PO daily  Avoid any Benzodiazapine use! (see discussion above)  Fall Precautions  I have updated the RN. She should be ready to d/c back to facility today. We will sign off, please call back if needed.   Gorge Almanza Metzger-Cihelka, ARNP-C [facility]   If 7pm to 7am, please call on call as listed on AMION.

## 2017-07-22 NOTE — ED Notes (Signed)
Attempted to call report, RN to call back.

## 2017-07-22 NOTE — Progress Notes (Signed)
PROGRESS NOTE                                                                                                                                                                                                             Patient Demographics:    Abigail Boyd, is a 84 y.o. female, DOB - 1929/04/25, GYF:[id number]  Admit date - 07/21/2017   Admitting Physician Vianne Bulls, MD  Outpatient Primary MD for the patient is Caryl Bis Angela Adam, MD  LOS - 0   Chief Complaint  Patient presents with  . Code Stroke       Brief Narrative   84 y.o. female with medical history significant for history of CVA with residual right-sided motor deficits, chronic diastolic CHF, paroxysmal atrial fibrillation on Xarelto, insulin-dependent diabetes mellitus, and chronic kidney disease stage II, now presenting with acute somnolence and speech difficulty from her SNF where she is rehabilitating after admission for UTI and generalized weakness.   Subjective:    Keishla Vanover today ports she has a hard time with speech .   Assessment  & Plan :    Principal Problem:   Acute encephalopathy Active Problems:   Essential hypertension   CAD-PCI OM1, 100% RCA (L-R colllaterals)   PAF (paroxysmal atrial fibrillation) (HCC);  CHA2DS2-VASc Score =6 on 15 mg Xarelto   Obstructive sleep apnea   CKD (chronic kidney disease), stage II   Insulin-requiring or dependent type II diabetes mellitus (HCC)   History of cardioembolic cerebrovascular accident (CVA)   Chronic diastolic CHF (congestive heart failure) (HCC)   Acute encephalopathy  - Presents from SNF with acute somnolence and difficulty with speech, code stroke was called d/t right facial droop and right-sided weakness, she was seen by neurology. -Was felt to be initially secondary to Xanax, headache, failed her swallow evaluation, her speech still not back to baseline, I will obtain MRI head. -Evidence of infectious  etiology -N.p.o. as she failed her swallow evaluation, - Neuro consulting and much appreciated; current condition likely secondary to benzodiazepine rather than CVA or seizure but hospital observation recommended with EEG if fails to improve and MRI brain if new deficits detected   Hx of CVA with residual motor deficits  - Hx of CVA with right-sided motor deficits  - Head CT is negative for acute findings and old strokes appear stable  -  Follow on MRI  Paroxysmal atrial fibrillation  - Cannot take oral metoprolol, will start on scheduled metoprolol 5 every 6 hours - CHADS-VASc is 71 (age x2, gender, CHF, CVA x2, DM)  she is on Xarelto, now she is n.p.o. we will switch to Lovenox    Chronic diastolic CHF  - Appears well-compensated  - Lasix held on admission as she is NPO and creatinine is increased  - Follow daily wts, continue beta-blocker    CAD - No anginal complaints, troponin undetectable   - Continue beta-blocker    Insulin-dependent DM  - A1c was 8.4% last October  - Managed at home with metformin, Januvia, Toujeo 85 units qHS, and Humalog 22 units TID  - Follow CBG's, start with Lantus 50 units qHS and Novolog per sliding-scale    CKD stage II  - SCr is 1.21 on admission, slightly up from her apparent baseline  - Hold Lasix initially, avoid nephrotoxins, repeat chemistries in am         Code Status : DNR  Family Communication  : daughter ,sister, niece at bedside  Disposition Plan  : pending further work up.  Consults  :  Neurology  Procedures  : none   DVT Prophylaxis  :  Xarelo>>Lovenox  Lab Results  Component Value Date   PLT 323 07/21/2017    Antibiotics  :    Anti-infectives (From admission, onward)   None        Objective:   Vitals:   07/22/17 1230 07/22/17 1245 07/22/17 1254 07/22/17 1345  BP: (!) 167/89 (!) 179/87 (!) 179/87 (!) 187/74  Pulse: 97 97  97  Resp: (!) 8 12  17   Temp:      TempSrc:      SpO2: 94% 96%  99%  Weight:       Height:        Wt Readings from Last 3 Encounters:  07/22/17 83.9 kg (185 lb)  07/05/17 83.1 kg (183 lb 4.8 oz)  06/04/17 82.1 kg (181 lb)     Intake/Output Summary (Last 24 hours) at 07/22/2017 1528 Last data filed at 07/21/2017 2347 Gross per 24 hour  Intake -  Output 300 ml  Net -300 ml     Physical Exam  Awake Alert, Oriented X 3, has low tone whispered speech, extremely anxious Symmetrical Chest wall movement, Good air movement bilaterally, CTAB IRR IRR,No Gallops,Rubs or new Murmurs, No Parasternal Heave +ve B.Sounds, Abd Soft, No tenderness, No rebound - guarding or rigidity. No Cyanosis, Clubbing or edema, No new Rash or bruise     Data Review:    CBC Recent Labs  Lab 07/21/17 1743 07/21/17 1749  WBC 10.1  --   HGB 13.8 14.6  HCT 43.3 43.0  PLT 323  --   MCV 94.5  --   MCH 30.1  --   MCHC 31.9  --   RDW 13.4  --   LYMPHSABS 1.4  --   MONOABS 0.7  --   EOSABS 0.2  --   BASOSABS 0.1  --     Chemistries  Recent Labs  Lab 07/21/17 1743 07/21/17 1749  NA 136 139  K 4.2 4.2  CL 95* 94*  CO2 28  --   GLUCOSE 221* 218*  BUN 18 20  CREATININE 1.21* 1.00  CALCIUM 9.7  --   AST 23  --   ALT 25  --   ALKPHOS 85  --   BILITOT 0.4  --    ------------------------------------------------------------------------------------------------------------------  No results for input(s): CHOL, HDL, LDLCALC, TRIG, CHOLHDL, LDLDIRECT in the last 72 hours.  Lab Results  Component Value Date   HGBA1C 8.4 (H) 12/12/2016   ------------------------------------------------------------------------------------------------------------------ Recent Labs    07/22/17 0026  TSH 4.194   ------------------------------------------------------------------------------------------------------------------ Recent Labs    07/22/17 0026  VITAMINB12 342    Coagulation profile Recent Labs  Lab 07/21/17 1743  INR 1.13    No results for input(s): DDIMER in the last  72 hours.  Cardiac Enzymes No results for input(s): CKMB, TROPONINI, MYOGLOBIN in the last 168 hours.  Invalid input(s): CK ------------------------------------------------------------------------------------------------------------------    Component Value Date/Time   BNP 75.3 04/22/2015 1451    Inpatient Medications  Scheduled Meds: . docusate sodium  100 mg Oral QHS  . enoxaparin (LOVENOX) injection  1 mg/kg Subcutaneous Q12H  . FLUoxetine  20 mg Oral Daily  . gabapentin  300 mg Oral TID  . insulin aspart  0-15 Units Subcutaneous Q4H  . insulin glargine  50 Units Subcutaneous QHS  . latanoprost  1 drop Both Eyes QHS  . magnesium oxide  400 mg Oral QHS  . metoprolol tartrate  5 mg Intravenous Q6H  . metoprolol tartrate  25 mg Oral BID  . mirabegron ER  50 mg Oral Daily  . omega-3 acid ethyl esters  1 g Oral Daily  . pantoprazole  40 mg Oral Daily  . rOPINIRole  0.5 mg Oral QHS  . sodium chloride flush  3 mL Intravenous Q12H  . sodium chloride flush  3 mL Intravenous Q12H  . timolol  1 drop Both Eyes Daily   Continuous Infusions: . sodium chloride     PRN Meds:.sodium chloride, acetaminophen **OR** acetaminophen, bisacodyl, hydrALAZINE, ondansetron **OR** ondansetron (ZOFRAN) IV, polyethylene glycol, sodium chloride flush  Micro Results No results found for this or any previous visit (from the past 240 hour(s)).  Radiology Reports Ct Angio Head W Or Wo Contrast  Result Date: 07/21/2017 CLINICAL DATA:  New onset right-sided weakness. EXAM: CT ANGIOGRAPHY HEAD AND NECK TECHNIQUE: Multidetector CT imaging of the head and neck was performed using the standard protocol during bolus administration of intravenous contrast. Multiplanar CT image reconstructions and MIPs were obtained to evaluate the vascular anatomy. Carotid stenosis measurements (when applicable) are obtained utilizing NASCET criteria, using the distal internal carotid diameter as the denominator. CONTRAST:  64mL  ISOVUE-370 IOPAMIDOL (ISOVUE-370) INJECTION 76% COMPARISON:  CT head without contrast 07/21/2017 and 01/20/2017. MRI brain and MRA head 05/18/2015. FINDINGS: CTA NECK FINDINGS Aortic arch: A 3 vessel arch configuration is present. Atherosclerotic calcifications are present origin of the left subclavian artery without a significant stenosis. Right carotid system: The right common carotid artery is within normal limits. Bifurcation is unremarkable. There is tortuosity of the cervical right ICA. Irregularity is present in the mid cervical right ICA without significant stenosis. Left carotid system: The left common carotid artery is within normal limits. The bifurcation unremarkable. There is focal irregularity of the mid left cervical ICA. No significant luminal stenosis is present. Overall caliber of the left internal carotid artery is smaller than the right. Vertebral arteries: The right vertebral artery is slightly dominant to the left. Both vertebral arteries originate from the subclavian arteries without significant stenosis. There is no significant stenosis or focal vessel irregularity within either vertebral artery in the neck. Skeleton: Endplate degenerative changes are evident from C3-4 through C6-7. Osseous foraminal narrowing is greatest on the left at C5-6 and on the right at C4-5 and C6-7. Other neck:  Multiple hypodense thyroid nodules are present. These measure less than 10 mm. There is no dominant lesion. No significant cervical adenopathy is present. Salivary glands are within normal limits. No focal mucosal or submucosal lesions are evident. Vocal cords are midline and symmetric. Upper chest: Scattered ground-glass attenuation is present in the lung is bilaterally. No focal airspace consolidation is present. There is no nodule or mass lesion. Review of the MIP images confirms the above findings CTA HEAD FINDINGS Anterior circulation: No atherosclerotic calcifications are present within the cavernous  internal carotid arteries bilaterally without significant stenosis. The ICA termini are within normal limits bilaterally. The A1 and M1 segments are normal. The anterior communicating artery is present. MCA bifurcations are within normal limits. The proximal MCA branch vessels are within normal limits bilaterally. There is some distal attenuation bilaterally. No significant proximal stenosis or occlusion is present. There is moderate focal stenosis within the mid left A2 segment. There is some attenuation of distal ACA branch vessels bilaterally. Posterior circulation: The right vertebral artery is slightly dominant. PICA origins are visualized and normal. The basilar artery is normal. Both posterior cerebral arteries originate from basilar tip. A moderate stenosis is present in the right P2 segment. There is asymmetric attenuation of PCA branch vessels on the right. No significant proximal branch vessel occlusion is present. Venous sinuses: The dural sinuses are patent. The straight sinus and deep cerebral veins are intact. Cortical veins are unremarkable. Anatomic variants: None Review of the MIP images confirms the above findings IMPRESSION: 1. Atherosclerotic changes at the origin of the left subclavian artery without significant stenosis in the neck. 2. Vessel irregularity in the mid cervical segments bilaterally, left more prominent than right. Findings are consistent with fibromuscular dysplasia. 3. Atherosclerotic changes within the cavernous internal carotid arteries bilaterally without a significant stenosis. 4. No significant proximal stenosis, aneurysm, or branch vessel occlusion within the circle-of-Willis. 5. Moderate left A2/A3 segment stenosis. 6. Mild multilevel degenerative changes of the cervical spine. 7. Ground-glass attenuation the lung apices bilaterally likely reflects edema or atelectasis. Electronically Signed   By: San Morelle M.D.   On: 07/21/2017 18:41   Dg Tibia/fibula  Right  Result Date: 07/21/2017 CLINICAL DATA:  Right leg pain.  Patient is unresponsive. EXAM: RIGHT TIBIA AND FIBULA - 2 VIEW COMPARISON:  None. FINDINGS: Right tibia and fibula appear intact. No evidence of acute fracture or dislocation. No focal bone lesion or bone destruction. Soft tissues are unremarkable. IMPRESSION: No acute bony abnormalities. Electronically Signed   By: Lucienne Capers M.D.   On: 07/21/2017 21:16   Ct Angio Neck W Or Wo Contrast  Result Date: 07/21/2017 CLINICAL DATA:  New onset right-sided weakness. EXAM: CT ANGIOGRAPHY HEAD AND NECK TECHNIQUE: Multidetector CT imaging of the head and neck was performed using the standard protocol during bolus administration of intravenous contrast. Multiplanar CT image reconstructions and MIPs were obtained to evaluate the vascular anatomy. Carotid stenosis measurements (when applicable) are obtained utilizing NASCET criteria, using the distal internal carotid diameter as the denominator. CONTRAST:  26mL ISOVUE-370 IOPAMIDOL (ISOVUE-370) INJECTION 76% COMPARISON:  CT head without contrast 07/21/2017 and 01/20/2017. MRI brain and MRA head 05/18/2015. FINDINGS: CTA NECK FINDINGS Aortic arch: A 3 vessel arch configuration is present. Atherosclerotic calcifications are present origin of the left subclavian artery without a significant stenosis. Right carotid system: The right common carotid artery is within normal limits. Bifurcation is unremarkable. There is tortuosity of the cervical right ICA. Irregularity is present in the mid cervical right  ICA without significant stenosis. Left carotid system: The left common carotid artery is within normal limits. The bifurcation unremarkable. There is focal irregularity of the mid left cervical ICA. No significant luminal stenosis is present. Overall caliber of the left internal carotid artery is smaller than the right. Vertebral arteries: The right vertebral artery is slightly dominant to the left. Both  vertebral arteries originate from the subclavian arteries without significant stenosis. There is no significant stenosis or focal vessel irregularity within either vertebral artery in the neck. Skeleton: Endplate degenerative changes are evident from C3-4 through C6-7. Osseous foraminal narrowing is greatest on the left at C5-6 and on the right at C4-5 and C6-7. Other neck: Multiple hypodense thyroid nodules are present. These measure less than 10 mm. There is no dominant lesion. No significant cervical adenopathy is present. Salivary glands are within normal limits. No focal mucosal or submucosal lesions are evident. Vocal cords are midline and symmetric. Upper chest: Scattered ground-glass attenuation is present in the lung is bilaterally. No focal airspace consolidation is present. There is no nodule or mass lesion. Review of the MIP images confirms the above findings CTA HEAD FINDINGS Anterior circulation: No atherosclerotic calcifications are present within the cavernous internal carotid arteries bilaterally without significant stenosis. The ICA termini are within normal limits bilaterally. The A1 and M1 segments are normal. The anterior communicating artery is present. MCA bifurcations are within normal limits. The proximal MCA branch vessels are within normal limits bilaterally. There is some distal attenuation bilaterally. No significant proximal stenosis or occlusion is present. There is moderate focal stenosis within the mid left A2 segment. There is some attenuation of distal ACA branch vessels bilaterally. Posterior circulation: The right vertebral artery is slightly dominant. PICA origins are visualized and normal. The basilar artery is normal. Both posterior cerebral arteries originate from basilar tip. A moderate stenosis is present in the right P2 segment. There is asymmetric attenuation of PCA branch vessels on the right. No significant proximal branch vessel occlusion is present. Venous sinuses:  The dural sinuses are patent. The straight sinus and deep cerebral veins are intact. Cortical veins are unremarkable. Anatomic variants: None Review of the MIP images confirms the above findings IMPRESSION: 1. Atherosclerotic changes at the origin of the left subclavian artery without significant stenosis in the neck. 2. Vessel irregularity in the mid cervical segments bilaterally, left more prominent than right. Findings are consistent with fibromuscular dysplasia. 3. Atherosclerotic changes within the cavernous internal carotid arteries bilaterally without a significant stenosis. 4. No significant proximal stenosis, aneurysm, or branch vessel occlusion within the circle-of-Willis. 5. Moderate left A2/A3 segment stenosis. 6. Mild multilevel degenerative changes of the cervical spine. 7. Ground-glass attenuation the lung apices bilaterally likely reflects edema or atelectasis. Electronically Signed   By: San Morelle M.D.   On: 07/21/2017 18:41   Dg Knee Complete 4 Views Right  Result Date: 07/21/2017 CLINICAL DATA:  Right leg pain.  Patient is unresponsive. EXAM: RIGHT KNEE - COMPLETE 4+ VIEW COMPARISON:  None. FINDINGS: Mild degenerative changes in the right knee with medial compartment narrowing. No evidence of acute fracture or dislocation. No focal bone lesion or bone destruction. Bone cortex appears intact. No significant effusion. Soft tissues are unremarkable. IMPRESSION: Mild degenerative changes in the right knee. No acute bony abnormalities. Electronically Signed   By: Lucienne Capers M.D.   On: 07/21/2017 21:16   Dg Hip Unilat W Or Wo Pelvis 2-3 Views Right  Result Date: 07/21/2017 CLINICAL DATA:  Right leg pain.  Patient is unresponsive. EXAM: DG HIP (WITH OR WITHOUT PELVIS) 2-3V RIGHT COMPARISON:  None. FINDINGS: Severe degenerative changes in the right hip with loss of superior acetabular joint space and bone-on-bone appearance. There is remodeling and sclerosis of the right femoral  head. This may indicate severe degenerative changes or avascular necrosis. No evidence of acute fracture or dislocation of the pelvis or right hip. SI joints and symphysis pubis are not displaced. Residual contrast material in the bladder. Degenerative changes in the lower lumbar spine and in the left hip. Vascular calcifications. IMPRESSION: Severe degenerative changes in the right hip. Remodeling and sclerosis of the right femoral head may indicate severe degenerative changes or avascular necrosis. No acute fracture or dislocation. Electronically Signed   By: Lucienne Capers M.D.   On: 07/21/2017 21:15   Ct Head Code Stroke Wo Contrast  Result Date: 07/21/2017 CLINICAL DATA:  Code stroke. New onset of right-sided weakness. The patient is on blood thinners. EXAM: CT HEAD WITHOUT CONTRAST TECHNIQUE: Contiguous axial images were obtained from the base of the skull through the vertex without intravenous contrast. COMPARISON:  CT head without contrast 01/20/2017. FINDINGS: Brain: Paraventricular white matter disease similar the prior exam. A remote lacunar infarct of the right internal capsule is stable. Remote lacunar infarcts are present in the thalami bilaterally. A remote lacunar infarct is present in the left caudate head. No new infarcts are present. The insular cortex is intact. The brainstem and cerebellum are normal. Vascular: Atherosclerotic calcifications are present within the cavernous internal carotid arteries bilaterally. There is no hyperdense vessel. Skull: Calvarium is intact. Sinuses/Orbits: A fluid level is present within the sphenoid sinuses. Paranasal sinuses are otherwise clear. The mastoid air cells are clear. ASPECTS ([place] Stroke Program Early CT Score) - Ganglionic level infarction (caudate, lentiform nuclei, internal capsule, insula, M1-M3 cortex): 7/7 - Supraganglionic infarction (M4-M6 cortex): 3/3 Total score (0-10 with 10 being normal): 10/10 IMPRESSION: 1. Stable appearance of  remote lacunar infarcts involving the basal ganglia bilaterally and the right internal capsule. 2. No acute intracranial abnormality. 3. Stable atrophy and white matter disease. This likely reflects the sequela of chronic microvascular ischemia. 4. Sphenoid sinus disease. 5. ASPECTS is 10/10 The above was relayed via text pager to Dr. Lorraine Lax on 07/21/2017 at 17:59 . Electronically Signed   By: San Morelle M.D.   On: 07/21/2017 18:00   [place] Ekg Site Rite  Result Date: 07/07/2017 If Site Rite image not attached, placement could not be confirmed due to current cardiac rhythm.   Time Spent in minutes  No charge Note   Phillips Climes M.D on 07/22/2017 at 3:28 PM  Between 7am to 7pm - Pager - [phone]  After 7pm go to [email or web address] - password [facility]  [name] -  Office  [phone]

## 2017-07-23 ENCOUNTER — Observation Stay (HOSPITAL_COMMUNITY): Payer: Medicare Other

## 2017-07-23 DIAGNOSIS — N182 Chronic kidney disease, stage 2 (mild): Secondary | ICD-10-CM | POA: Diagnosis not present

## 2017-07-23 DIAGNOSIS — Z955 Presence of coronary angioplasty implant and graft: Secondary | ICD-10-CM | POA: Diagnosis not present

## 2017-07-23 DIAGNOSIS — I69391 Dysphagia following cerebral infarction: Secondary | ICD-10-CM | POA: Diagnosis not present

## 2017-07-23 DIAGNOSIS — I130 Hypertensive heart and chronic kidney disease with heart failure and stage 1 through stage 4 chronic kidney disease, or unspecified chronic kidney disease: Secondary | ICD-10-CM | POA: Diagnosis not present

## 2017-07-23 DIAGNOSIS — I5032 Chronic diastolic (congestive) heart failure: Secondary | ICD-10-CM | POA: Diagnosis not present

## 2017-07-23 DIAGNOSIS — G4761 Periodic limb movement disorder: Secondary | ICD-10-CM | POA: Diagnosis not present

## 2017-07-23 DIAGNOSIS — I639 Cerebral infarction, unspecified: Secondary | ICD-10-CM | POA: Diagnosis not present

## 2017-07-23 DIAGNOSIS — N183 Chronic kidney disease, stage 3 (moderate): Secondary | ICD-10-CM | POA: Diagnosis not present

## 2017-07-23 DIAGNOSIS — G9340 Encephalopathy, unspecified: Secondary | ICD-10-CM | POA: Diagnosis not present

## 2017-07-23 DIAGNOSIS — H409 Unspecified glaucoma: Secondary | ICD-10-CM | POA: Diagnosis not present

## 2017-07-23 DIAGNOSIS — Z7901 Long term (current) use of anticoagulants: Secondary | ICD-10-CM | POA: Diagnosis not present

## 2017-07-23 DIAGNOSIS — M48061 Spinal stenosis, lumbar region without neurogenic claudication: Secondary | ICD-10-CM | POA: Diagnosis not present

## 2017-07-23 DIAGNOSIS — N3281 Overactive bladder: Secondary | ICD-10-CM | POA: Diagnosis not present

## 2017-07-23 DIAGNOSIS — R29709 NIHSS score 9: Secondary | ICD-10-CM | POA: Diagnosis present

## 2017-07-23 DIAGNOSIS — I6381 Other cerebral infarction due to occlusion or stenosis of small artery: Secondary | ICD-10-CM | POA: Diagnosis present

## 2017-07-23 DIAGNOSIS — E785 Hyperlipidemia, unspecified: Secondary | ICD-10-CM | POA: Diagnosis not present

## 2017-07-23 DIAGNOSIS — Z90710 Acquired absence of both cervix and uterus: Secondary | ICD-10-CM | POA: Diagnosis not present

## 2017-07-23 DIAGNOSIS — I480 Paroxysmal atrial fibrillation: Secondary | ICD-10-CM | POA: Diagnosis not present

## 2017-07-23 DIAGNOSIS — R4701 Aphasia: Secondary | ICD-10-CM | POA: Diagnosis not present

## 2017-07-23 DIAGNOSIS — Z794 Long term (current) use of insulin: Secondary | ICD-10-CM | POA: Diagnosis not present

## 2017-07-23 DIAGNOSIS — K219 Gastro-esophageal reflux disease without esophagitis: Secondary | ICD-10-CM | POA: Diagnosis not present

## 2017-07-23 DIAGNOSIS — I69351 Hemiplegia and hemiparesis following cerebral infarction affecting right dominant side: Secondary | ICD-10-CM | POA: Diagnosis not present

## 2017-07-23 DIAGNOSIS — Z66 Do not resuscitate: Secondary | ICD-10-CM | POA: Diagnosis present

## 2017-07-23 DIAGNOSIS — Z9861 Coronary angioplasty status: Secondary | ICD-10-CM | POA: Diagnosis not present

## 2017-07-23 DIAGNOSIS — F419 Anxiety disorder, unspecified: Secondary | ICD-10-CM | POA: Diagnosis present

## 2017-07-23 DIAGNOSIS — R262 Difficulty in walking, not elsewhere classified: Secondary | ICD-10-CM | POA: Diagnosis not present

## 2017-07-23 DIAGNOSIS — G9341 Metabolic encephalopathy: Secondary | ICD-10-CM | POA: Diagnosis present

## 2017-07-23 DIAGNOSIS — Z7401 Bed confinement status: Secondary | ICD-10-CM | POA: Diagnosis not present

## 2017-07-23 DIAGNOSIS — E1122 Type 2 diabetes mellitus with diabetic chronic kidney disease: Secondary | ICD-10-CM | POA: Diagnosis not present

## 2017-07-23 DIAGNOSIS — G4733 Obstructive sleep apnea (adult) (pediatric): Secondary | ICD-10-CM | POA: Diagnosis not present

## 2017-07-23 DIAGNOSIS — R1312 Dysphagia, oropharyngeal phase: Secondary | ICD-10-CM | POA: Diagnosis not present

## 2017-07-23 DIAGNOSIS — M6281 Muscle weakness (generalized): Secondary | ICD-10-CM | POA: Diagnosis not present

## 2017-07-23 DIAGNOSIS — Z8249 Family history of ischemic heart disease and other diseases of the circulatory system: Secondary | ICD-10-CM | POA: Diagnosis not present

## 2017-07-23 DIAGNOSIS — R29810 Facial weakness: Secondary | ICD-10-CM | POA: Diagnosis present

## 2017-07-23 DIAGNOSIS — Z823 Family history of stroke: Secondary | ICD-10-CM | POA: Diagnosis not present

## 2017-07-23 DIAGNOSIS — I69320 Aphasia following cerebral infarction: Secondary | ICD-10-CM | POA: Diagnosis not present

## 2017-07-23 DIAGNOSIS — I2510 Atherosclerotic heart disease of native coronary artery without angina pectoris: Secondary | ICD-10-CM | POA: Diagnosis not present

## 2017-07-23 DIAGNOSIS — N390 Urinary tract infection, site not specified: Secondary | ICD-10-CM | POA: Diagnosis present

## 2017-07-23 DIAGNOSIS — J302 Other seasonal allergic rhinitis: Secondary | ICD-10-CM | POA: Diagnosis not present

## 2017-07-23 DIAGNOSIS — I10 Essential (primary) hypertension: Secondary | ICD-10-CM | POA: Diagnosis not present

## 2017-07-23 DIAGNOSIS — M2550 Pain in unspecified joint: Secondary | ICD-10-CM | POA: Diagnosis not present

## 2017-07-23 LAB — GLUCOSE, CAPILLARY
GLUCOSE-CAPILLARY: 232 mg/dL — AB (ref 65–99)
GLUCOSE-CAPILLARY: 290 mg/dL — AB (ref 65–99)
Glucose-Capillary: 144 mg/dL — ABNORMAL HIGH (ref 65–99)
Glucose-Capillary: 151 mg/dL — ABNORMAL HIGH (ref 65–99)
Glucose-Capillary: 172 mg/dL — ABNORMAL HIGH (ref 65–99)
Glucose-Capillary: 186 mg/dL — ABNORMAL HIGH (ref 65–99)

## 2017-07-23 LAB — HEMOGLOBIN A1C
HEMOGLOBIN A1C: 7.8 % — AB (ref 4.8–5.6)
Mean Plasma Glucose: 177.16 mg/dL

## 2017-07-23 MED ORDER — METOPROLOL TARTRATE 25 MG PO TABS
25.0000 mg | ORAL_TABLET | Freq: Two times a day (BID) | ORAL | Status: DC
  Administered 2017-07-24 – 2017-07-25 (×3): 25 mg via ORAL
  Filled 2017-07-23 (×4): qty 1

## 2017-07-23 MED ORDER — RIVAROXABAN 15 MG PO TABS
15.0000 mg | ORAL_TABLET | Freq: Every evening | ORAL | Status: DC
  Administered 2017-07-23 – 2017-07-24 (×2): 15 mg via ORAL
  Filled 2017-07-23 (×2): qty 1

## 2017-07-23 MED ORDER — ENOXAPARIN SODIUM 80 MG/0.8ML ~~LOC~~ SOLN: 80.0000 mg | Freq: Two times a day (BID) | SUBCUTANEOUS | Status: DC

## 2017-07-23 NOTE — Clinical Social Work Note (Signed)
Clinical Social Work Assessment  Patient Details  Name: Abigail Boyd MRN: [id number] Date of Birth: 05/25/1929  Date of referral:  07/23/17               Reason for consult:  Facility Placement                Permission sought to share information with:  Family Supports Permission granted to share information::  Yes, Release of Information Signed  Name::     Copy::  [place], [place], [place]  Relationship::  daughter  Contact Information:  [phone]  Housing/Transportation Living arrangements for the past 2 months:  [place], [place] of Information:  Adult Children Patient Interpreter Needed:  None Criminal Activity/Legal Involvement Pertinent to Current Situation/Hospitalization:  No - Comment as needed Significant Relationships:  Adult Children Lives with:  Adult Children Do you feel safe going back to the place where you live?  No Need for family participation in patient care:  No (Coment)  Care giving concerns:  Pt is only alert to self. CSW spoke with pt's daughter via telephone. Pt was at SNF [facility]) prior to admission.   Social Worker assessment / plan:  CSW spoke with pt's daughter via telephone. Pt's daughter states pt was living with her and developed an infection and was admitted at [facility] and then went to [facility] (SNF) for rehab where she was then admitted to [facility]. Pt's daughter does not want her to go back to [facility]. Pt's daughter would like for her mom to be faxed out to [place], and [place] (preference in that order). CSW will follow up with pt's daughter with bed availability once determined.  Employment status:  Retired Nurse, adult PT Recommendations:  Not assessed at this time Information / Referral to community resources:  [place]  Patient/Family's Response to care:  Pt's daughter verbalized understanding of CSW role and expressed  appreciation for support. Pt's daughter denies any concern regarding pt care at this time.   Patient/Family's Understanding of and Emotional Response to Diagnosis, Current Treatment, and Prognosis:  Pt's daughter understanding and realistic regarding pt's physical limitations. Pt's daughter understands the need for pt to dc to SNF--pt's daughter agreeable. Pt's daughter denies any concern regarding pt's treatment plan at this time. CSW will continue to provide support and facilitate d/c needs.  Emotional Assessment Appearance:  Appears stated age Attitude/Demeanor/Rapport:  Unable to Assess Affect (typically observed):  Unable to Assess Orientation:  Oriented to Self Alcohol / Substance use:  Not Applicable Psych involvement (Current and /or in the community):  No (Comment)  Discharge Needs  Concerns to be addressed:  Basic Needs, Care Coordination Readmission within the last 30 days:  Yes Current discharge risk:  Dependent with Mobility Barriers to Discharge:  Continued Medical Work up   [employer], LCSW 07/23/2017, 10:57 AM

## 2017-07-23 NOTE — Progress Notes (Signed)
Modified Barium Swallow Progress Note  Patient Details  Name: Abigail Boyd MRN: [id number] Date of Birth: May 26, 1929  Today's Date: 07/23/2017  Modified Barium Swallow completed.  Full report located under Chart Review in the Imaging Section.  Brief recommendations include the following:  Clinical Impression  Patient presents with moderate oropharyngeal dysphagia due to sensory and motor deficits; there is sensed aspiration of thin liquids and straw sips of nectar. Oral stage characterized by impaired mastication, decreased bolus cohesion, delayed oral transit. There is mild-moderate lingual residue with solids, as well as mild oral holding with liquids, right anterior spillage with thin liquids due to decreased labial seal. Swallow initiation delayed to valleculae for most consistencies, but pyriform sinuses with thin liquids and straw sips of nectar. There is decreased/sluggish hyolarygneal excursion with delayed closure of laryngeal vestibule leading to penetration and aspiration before the swallow thin and straw sips of nectar. Pt did elicit a cough response which cleared remaining penetration but not aspiration. Chin tuck did not prevent deep penetration/questionable trace aspiration of thin liquids. No penetration/aspiration with cup sips of nectar. No abnormal residue remained in the pharynx after the swallow, with the exception of barium tablet which was retained in the valleculae and cleared with reflexive subsequent swallow. Cervical esophageal phase was unremarkable. Limited esophageal sweep performed due to positioning constraints. Recommend initiating dys 2, nectar thick liquids, NO STRAWS, meds whole with nectar thick liquid. Pt would benefit from interventions to address strengthening of hyolaryngeal complex, cough. Will continue to follow. Please order SLP cognitive-linguistic evaluation.    Swallow Evaluation Recommendations   Recommended Consults: Other (Comment)(SLP cognitive  linguistic evaluation)   SLP Diet Recommendations: Dysphagia 2 (Fine chop) solids;Nectar thick liquid   Liquid Administration via: Cup;No straw   Medication Administration: Whole meds with liquid   Supervision: Staff to assist with self feeding;Full supervision/cueing for compensatory strategies   Compensations: Slow rate;Small sips/bites   Postural Changes: Seated upright at 90 degrees   Oral Care Recommendations: Oral care BID   Other Recommendations: Remove water pitcher;Have oral suction available  Deneise Lever, [place], CCC-SLP Speech-Language Pathologist [phone]   Aliene Altes 07/23/2017,11:05 AM

## 2017-07-23 NOTE — Progress Notes (Signed)
PROGRESS NOTE                                                                                                                                                                                                             Patient Demographics:    Abigail Boyd, is a 84 y.o. female, DOB - 03/12/1929, UUV:[id number]  Admit date - 07/21/2017   Admitting Physician Vianne Bulls, MD  Outpatient Primary MD for the patient is Caryl Bis Angela Adam, MD  LOS - 0   Chief Complaint  Patient presents with  . Code Stroke       Brief Narrative   84 y.o. female with medical history significant for history of CVA with residual right-sided motor deficits, chronic diastolic CHF, paroxysmal atrial fibrillation on Xarelto, insulin-dependent diabetes mellitus, and chronic kidney disease stage II, now presenting with acute somnolence and speech difficulty from her SNF where she is rehabilitating after admission for UTI and generalized weakness.  MRI significant for acute CVA.   Subjective:    Abigail Boyd no significant events overnight, with significant aphasia   Assessment  & Plan :    Principal Problem:   Acute encephalopathy Active Problems:   Essential hypertension   CAD-PCI OM1, 100% RCA (L-R colllaterals)   PAF (paroxysmal atrial fibrillation) (HCC);  CHA2DS2-VASc Score =6 on 15 mg Xarelto   Obstructive sleep apnea   CKD (chronic kidney disease), stage II   Insulin-requiring or dependent type II diabetes mellitus (HCC)   History of cardioembolic cerebrovascular accident (CVA)   Chronic diastolic CHF (congestive heart failure) (HCC)   Acute CVA  -MRI significant for acute left thalamic lacunar stroke, neurology input greatly appreciated, likely related to her small vessel risk factors, including hypertension, hyperlipidemia and diabetes, opposed to her A. Fib. -No need to change her anti-coagulation, will continue with Xarelto(on Lovenox yesterday as she was unable  to take p.o., will be resumed back on Xarelto) -We will check lipid panel -Check hemoglobin A1c -No need to repeat echo. -Patient with significant aphasia and dysphagia, had MBS done today, on dysphagia 2 with nectar thick liquid, continue PT/OT/SLP.  Paroxysmal atrial fibrillation  - No able to take oral, I will change from IV to p.o. Metoprolol(heart rate was uncontrolled yesterday). - CHADS-VASc is 36 (age x2, gender, CHF, CVA x2, DM)  she is on Xarelto,  is on Lovenox yesterday as she was n.p.o., now she is cleared by SLP, will resume back on Xarelto.  Chronic diastolic CHF  - Appears well-compensated  - Lasix held on admission as she is NPO and creatinine is increased  - Will DC IV fluids, resume Lasix in 1 to 2 days  CAD - No anginal complaints, troponin undetectable   - Continue beta-blocker    Insulin-dependent DM  - A1c was 8.4% last October  - Managed at home with metformin, Januvia, Toujeo 85 units qHS, and Humalog 22 units TID  - CBGs were controlled on Lantus 50 units nightly, but now back on diet, likely will need to be increased .\  CKD stage II  - SCr is 1.21 on admission, slightly up from her apparent baseline  - Hold Lasix initially, avoid nephrotoxins, repeat chemistries in am         Code Status : DNR  Family Communication  : Multiple  family members at bedside  Disposition Plan  : SNF in 1-2 days  Consults  :  Neurology  Procedures  : none   DVT Prophylaxis  :  Xarelo  Lab Results  Component Value Date   PLT 339 07/22/2017    Antibiotics  :    Anti-infectives (From admission, onward)   None        Objective:   Vitals:   07/23/17 0353 07/23/17 0650 07/23/17 0812 07/23/17 1133  BP: 139/67 (!) 145/75 (!) 158/76 (!) 187/85  Pulse: 98  89 100  Resp: 18  16 19   Temp: 98.6 F (37 C)  98.2 F (36.8 C) 98.3 F (36.8 C)  TempSrc: Oral  Oral Oral  SpO2: 94%  97% 98%  Weight: 81 kg (178 lb 9.2 oz)     Height:        Wt Readings  from Last 3 Encounters:  07/23/17 81 kg (178 lb 9.2 oz)  07/05/17 83.1 kg (183 lb 4.8 oz)  06/04/17 82.1 kg (181 lb)    No intake or output data in the 24 hours ending 07/23/17 1201   Physical Exam  Alert oriented x3, lying comfortable in bed, no apparent distress, with significant expressive aphasia Clear to auscultation bilaterally, good air entry, no use of accessory muscles IRR IRR,No Gallops,Rubs or new Murmurs, No Parasternal Heave Soft, nontender, nondistended, bowel sounds present No Cyanosis, Clubbing or edema, No new Rash or bruise     Data Review:    CBC Recent Labs  Lab 07/21/17 1743 07/21/17 1749 07/22/17 1526  WBC 10.1  --  12.3*  HGB 13.8 14.6 15.0  HCT 43.3 43.0 46.4*  PLT 323  --  339  MCV 94.5  --  94.5  MCH 30.1  --  30.5  MCHC 31.9  --  32.3  RDW 13.4  --  13.6  LYMPHSABS 1.4  --   --   MONOABS 0.7  --   --   EOSABS 0.2  --   --   BASOSABS 0.1  --   --     Chemistries  Recent Labs  Lab 07/21/17 1743 07/21/17 1749 07/22/17 1526  NA 136 139 138  K 4.2 4.2 4.6  CL 95* 94* 98*  CO2 28  --  24  GLUCOSE 221* 218* 242*  BUN 18 20 14   CREATININE 1.21* 1.00 0.99  CALCIUM 9.7  --  9.8  AST 23  --   --   ALT 25  --   --  ALKPHOS 85  --   --   BILITOT 0.4  --   --    ------------------------------------------------------------------------------------------------------------------ No results for input(s): CHOL, HDL, LDLCALC, TRIG, CHOLHDL, LDLDIRECT in the last 72 hours.  Lab Results  Component Value Date   HGBA1C 8.4 (H) 12/12/2016   ------------------------------------------------------------------------------------------------------------------ Recent Labs    07/22/17 0026  TSH 4.194   ------------------------------------------------------------------------------------------------------------------ Recent Labs    07/22/17 0026  VITAMINB12 342    Coagulation profile Recent Labs  Lab 07/21/17 1743  INR 1.13    No results  for input(s): DDIMER in the last 72 hours.  Cardiac Enzymes No results for input(s): CKMB, TROPONINI, MYOGLOBIN in the last 168 hours.  Invalid input(s): CK ------------------------------------------------------------------------------------------------------------------    Component Value Date/Time   BNP 75.3 04/22/2015 1451    Inpatient Medications  Scheduled Meds: . docusate sodium  100 mg Oral QHS  . FLUoxetine  20 mg Oral Daily  . gabapentin  300 mg Oral TID  . insulin aspart  0-15 Units Subcutaneous Q4H  . insulin glargine  50 Units Subcutaneous QHS  . latanoprost  1 drop Both Eyes QHS  . magnesium oxide  400 mg Oral QHS  . metoprolol tartrate  25 mg Oral BID  . mirabegron ER  50 mg Oral Daily  . omega-3 acid ethyl esters  1 g Oral Daily  . pantoprazole  40 mg Oral Daily  . rivaroxaban  15 mg Oral QPM  . rOPINIRole  0.5 mg Oral QHS  . sodium chloride flush  3 mL Intravenous Q12H  . sodium chloride flush  3 mL Intravenous Q12H  . timolol  1 drop Both Eyes Daily   Continuous Infusions: . sodium chloride    . sodium chloride 40 mL/hr at 07/22/17 2220   PRN Meds:.sodium chloride, acetaminophen **OR** acetaminophen, bisacodyl, ondansetron **OR** ondansetron (ZOFRAN) IV, polyethylene glycol, sodium chloride flush  Micro Results Recent Results (from the past 240 hour(s))  Urine culture     Status: Abnormal (Preliminary result)   Collection Time: 07/21/17 10:37 PM  Result Value Ref Range Status   Specimen Description URINE, CLEAN CATCH  Final   Special Requests   Final    NONE Performed at [facility], [place] [address] [place], [place] [address]    Culture 50,000 COLONIES/mL KLEBSIELLA PNEUMONIAE (A)  Final   Report Status PENDING  Incomplete    Radiology Reports Ct Angio Head W Or Wo Contrast  Result Date: 07/21/2017 CLINICAL DATA:  New onset right-sided weakness. EXAM: CT ANGIOGRAPHY HEAD AND NECK TECHNIQUE: Multidetector CT imaging of the head and neck  was performed using the standard protocol during bolus administration of intravenous contrast. Multiplanar CT image reconstructions and MIPs were obtained to evaluate the vascular anatomy. Carotid stenosis measurements (when applicable) are obtained utilizing NASCET criteria, using the distal internal carotid diameter as the denominator. CONTRAST:  85mL ISOVUE-370 IOPAMIDOL (ISOVUE-370) INJECTION 76% COMPARISON:  CT head without contrast 07/21/2017 and 01/20/2017. MRI brain and MRA head 05/18/2015. FINDINGS: CTA NECK FINDINGS Aortic arch: A 3 vessel arch configuration is present. Atherosclerotic calcifications are present origin of the left subclavian artery without a significant stenosis. Right carotid system: The right common carotid artery is within normal limits. Bifurcation is unremarkable. There is tortuosity of the cervical right ICA. Irregularity is present in the mid cervical right ICA without significant stenosis. Left carotid system: The left common carotid artery is within normal limits. The bifurcation unremarkable. There is focal irregularity of the mid left cervical ICA. No  significant luminal stenosis is present. Overall caliber of the left internal carotid artery is smaller than the right. Vertebral arteries: The right vertebral artery is slightly dominant to the left. Both vertebral arteries originate from the subclavian arteries without significant stenosis. There is no significant stenosis or focal vessel irregularity within either vertebral artery in the neck. Skeleton: Endplate degenerative changes are evident from C3-4 through C6-7. Osseous foraminal narrowing is greatest on the left at C5-6 and on the right at C4-5 and C6-7. Other neck: Multiple hypodense thyroid nodules are present. These measure less than 10 mm. There is no dominant lesion. No significant cervical adenopathy is present. Salivary glands are within normal limits. No focal mucosal or submucosal lesions are evident. Vocal cords  are midline and symmetric. Upper chest: Scattered ground-glass attenuation is present in the lung is bilaterally. No focal airspace consolidation is present. There is no nodule or mass lesion. Review of the MIP images confirms the above findings CTA HEAD FINDINGS Anterior circulation: No atherosclerotic calcifications are present within the cavernous internal carotid arteries bilaterally without significant stenosis. The ICA termini are within normal limits bilaterally. The A1 and M1 segments are normal. The anterior communicating artery is present. MCA bifurcations are within normal limits. The proximal MCA branch vessels are within normal limits bilaterally. There is some distal attenuation bilaterally. No significant proximal stenosis or occlusion is present. There is moderate focal stenosis within the mid left A2 segment. There is some attenuation of distal ACA branch vessels bilaterally. Posterior circulation: The right vertebral artery is slightly dominant. PICA origins are visualized and normal. The basilar artery is normal. Both posterior cerebral arteries originate from basilar tip. A moderate stenosis is present in the right P2 segment. There is asymmetric attenuation of PCA branch vessels on the right. No significant proximal branch vessel occlusion is present. Venous sinuses: The dural sinuses are patent. The straight sinus and deep cerebral veins are intact. Cortical veins are unremarkable. Anatomic variants: None Review of the MIP images confirms the above findings IMPRESSION: 1. Atherosclerotic changes at the origin of the left subclavian artery without significant stenosis in the neck. 2. Vessel irregularity in the mid cervical segments bilaterally, left more prominent than right. Findings are consistent with fibromuscular dysplasia. 3. Atherosclerotic changes within the cavernous internal carotid arteries bilaterally without a significant stenosis. 4. No significant proximal stenosis, aneurysm, or  branch vessel occlusion within the circle-of-Willis. 5. Moderate left A2/A3 segment stenosis. 6. Mild multilevel degenerative changes of the cervical spine. 7. Ground-glass attenuation the lung apices bilaterally likely reflects edema or atelectasis. Electronically Signed   By: San Morelle M.D.   On: 07/21/2017 18:41   Dg Tibia/fibula Right  Result Date: 07/21/2017 CLINICAL DATA:  Right leg pain.  Patient is unresponsive. EXAM: RIGHT TIBIA AND FIBULA - 2 VIEW COMPARISON:  None. FINDINGS: Right tibia and fibula appear intact. No evidence of acute fracture or dislocation. No focal bone lesion or bone destruction. Soft tissues are unremarkable. IMPRESSION: No acute bony abnormalities. Electronically Signed   By: Lucienne Capers M.D.   On: 07/21/2017 21:16   Ct Angio Neck W Or Wo Contrast  Result Date: 07/21/2017 CLINICAL DATA:  New onset right-sided weakness. EXAM: CT ANGIOGRAPHY HEAD AND NECK TECHNIQUE: Multidetector CT imaging of the head and neck was performed using the standard protocol during bolus administration of intravenous contrast. Multiplanar CT image reconstructions and MIPs were obtained to evaluate the vascular anatomy. Carotid stenosis measurements (when applicable) are obtained utilizing NASCET criteria, using the distal  internal carotid diameter as the denominator. CONTRAST:  69mL ISOVUE-370 IOPAMIDOL (ISOVUE-370) INJECTION 76% COMPARISON:  CT head without contrast 07/21/2017 and 01/20/2017. MRI brain and MRA head 05/18/2015. FINDINGS: CTA NECK FINDINGS Aortic arch: A 3 vessel arch configuration is present. Atherosclerotic calcifications are present origin of the left subclavian artery without a significant stenosis. Right carotid system: The right common carotid artery is within normal limits. Bifurcation is unremarkable. There is tortuosity of the cervical right ICA. Irregularity is present in the mid cervical right ICA without significant stenosis. Left carotid system: The left  common carotid artery is within normal limits. The bifurcation unremarkable. There is focal irregularity of the mid left cervical ICA. No significant luminal stenosis is present. Overall caliber of the left internal carotid artery is smaller than the right. Vertebral arteries: The right vertebral artery is slightly dominant to the left. Both vertebral arteries originate from the subclavian arteries without significant stenosis. There is no significant stenosis or focal vessel irregularity within either vertebral artery in the neck. Skeleton: Endplate degenerative changes are evident from C3-4 through C6-7. Osseous foraminal narrowing is greatest on the left at C5-6 and on the right at C4-5 and C6-7. Other neck: Multiple hypodense thyroid nodules are present. These measure less than 10 mm. There is no dominant lesion. No significant cervical adenopathy is present. Salivary glands are within normal limits. No focal mucosal or submucosal lesions are evident. Vocal cords are midline and symmetric. Upper chest: Scattered ground-glass attenuation is present in the lung is bilaterally. No focal airspace consolidation is present. There is no nodule or mass lesion. Review of the MIP images confirms the above findings CTA HEAD FINDINGS Anterior circulation: No atherosclerotic calcifications are present within the cavernous internal carotid arteries bilaterally without significant stenosis. The ICA termini are within normal limits bilaterally. The A1 and M1 segments are normal. The anterior communicating artery is present. MCA bifurcations are within normal limits. The proximal MCA branch vessels are within normal limits bilaterally. There is some distal attenuation bilaterally. No significant proximal stenosis or occlusion is present. There is moderate focal stenosis within the mid left A2 segment. There is some attenuation of distal ACA branch vessels bilaterally. Posterior circulation: The right vertebral artery is slightly  dominant. PICA origins are visualized and normal. The basilar artery is normal. Both posterior cerebral arteries originate from basilar tip. A moderate stenosis is present in the right P2 segment. There is asymmetric attenuation of PCA branch vessels on the right. No significant proximal branch vessel occlusion is present. Venous sinuses: The dural sinuses are patent. The straight sinus and deep cerebral veins are intact. Cortical veins are unremarkable. Anatomic variants: None Review of the MIP images confirms the above findings IMPRESSION: 1. Atherosclerotic changes at the origin of the left subclavian artery without significant stenosis in the neck. 2. Vessel irregularity in the mid cervical segments bilaterally, left more prominent than right. Findings are consistent with fibromuscular dysplasia. 3. Atherosclerotic changes within the cavernous internal carotid arteries bilaterally without a significant stenosis. 4. No significant proximal stenosis, aneurysm, or branch vessel occlusion within the circle-of-Willis. 5. Moderate left A2/A3 segment stenosis. 6. Mild multilevel degenerative changes of the cervical spine. 7. Ground-glass attenuation the lung apices bilaterally likely reflects edema or atelectasis. Electronically Signed   By: San Morelle M.D.   On: 07/21/2017 18:41   Mr Brain Wo Contrast  Result Date: 07/22/2017 CLINICAL DATA:  Acute somnolence and speech difficulty. Chronic RIGHT-sided motor deficits. EXAM: MRI HEAD WITHOUT CONTRAST TECHNIQUE: Multiplanar, multiecho  pulse sequences of the brain and surrounding structures were obtained without intravenous contrast. COMPARISON:  CT head and CTA head neck 07/21/2017. FINDINGS: Brain: Restricted diffusion affects the LEFT lentiform nucleus, and periventricular white matter, consistent with an acute LEFT MCA lenticulostriate infarct. No hemorrhage, mass lesion, or extra-axial fluid. Hydrocephalus ex vacuo. Atrophy with small vessel disease.  Chronic lacunar infarcts affect the BILATERAL thalamus and BILATERAL basal ganglia. Vascular: Flow voids are maintained. Skull and upper cervical spine: Normal marrow signal. Pannus surrounds C1-C2. Sinuses/Orbits: No layering sinus fluid.  Negative orbits. Other: None. IMPRESSION: Acute LEFT MCA lenticulostriate territory infarct, nonhemorrhagic. Involvement of the LEFT lentiform nucleus and periventricular white matter. Chronic changes as described. Electronically Signed   By: Staci Righter M.D.   On: 07/22/2017 19:30   Dg Knee Complete 4 Views Right  Result Date: 07/21/2017 CLINICAL DATA:  Right leg pain.  Patient is unresponsive. EXAM: RIGHT KNEE - COMPLETE 4+ VIEW COMPARISON:  None. FINDINGS: Mild degenerative changes in the right knee with medial compartment narrowing. No evidence of acute fracture or dislocation. No focal bone lesion or bone destruction. Bone cortex appears intact. No significant effusion. Soft tissues are unremarkable. IMPRESSION: Mild degenerative changes in the right knee. No acute bony abnormalities. Electronically Signed   By: Lucienne Capers M.D.   On: 07/21/2017 21:16   Dg Swallowing Func-speech Pathology  Result Date: 07/23/2017 Objective Swallowing Evaluation: Type of Study: MBS-Modified Barium Swallow Study  Patient Details Name: AVIANCE COOPERWOOD MRN: [id number] Date of Birth: 18-Feb-1930 Today's Date: 07/23/2017 Time: SLP Start Time (ACUTE ONLY): 0935 -SLP Stop Time (ACUTE ONLY): 1004 SLP Time Calculation (min) (ACUTE ONLY): 29 min Past Medical History: Past Medical History: Diagnosis Date . Abnormal nuclear stress test 06/21/2011  Inferolateral reversible defect;--> cardiac cath & PCI of CxOM, occluded RCA with collaterals;; followup Myoview 11/2012: Low risk. Fixed basal inferior artifact normal EF. No ischemia . Anxiety   When necessary Xanax . Anxiety  . Asthma  . Bilateral cataracts   Status post stroke or correction . CAD S/P percutaneous coronary angioplasty 06/2011  s/P  PCI to proximal OM1 w/ DES; occluded RCA with bridging and L-R collaterals (medical management) . Chronic kidney disease (CKD), stage II (mild)   Related to current bladder infections (although diabetes cannot be excluded) . Chronotropic incompetence with sinus node dysfunction [facility]) October 2013  On CPET test; beta blockers reduced . Diabetes mellitus type 2, controlled ([place])   On oral medications . Diverticulitis  . Essential hypertension   Allowing for permissive hypertension to avoid orthostatic hypotension . GERD (gastroesophageal reflux disease)   On PPI . Glaucoma  . History of syncope   Per EP - neurocardiogenic & not Bradycardia related (no PPM) . History of unstable angina 06/13/2011  Jaw pain awakening from sleep -- Myoview --CATH --> PCI . Hyperlipidemia with target LDL less than 70   HDL at goal, LDL not at goal, borderline triglycerides. On Crestor 20 mg . Migraines  . OSA (obstructive sleep apnea)   hx bladder infections . Osteoarthritis  . PAF (paroxysmal atrial fibrillation) ([place]) 10-11/ 2014  CardioNet Event Monitor: NSR & S Brady -- Rates 50-100; Total A. fib burden 35 hours and 27 minutes. 1296 episodes, longest was 1 hour 29 minutes. Rate ranged from 52-169 beats per minute. . Seasonal allergies  . Shortness of breath on exertion October 2013  2-D echo: Normal EF>55%, Gr 1DD, mild aortic sclerosis; Evaluated with CPET - peak VO2 97%; Chronotropic Incompetence (submaximal effort) . Spinal  stenosis of lumbar region 11/2011 . Stroke ([place])  . Tachycardia-bradycardia syndrome ([place]) 11/2012 . Urge incontinence  Past Surgical History: Past Surgical History: Procedure Laterality Date . APPENDECTOMY   . BREAST BIOPSY    both breast . cataracts    both eyes . COLONOSCOPY   . CORONARY ANGIOPLASTY WITH STENT PLACEMENT  07/05/2011  Promus Element DES 2.5 mm x 16 mm-post dilated to 2.65 mm CX-Proximal OM1 . Holter Monitor  04/2015  Sinus rhythm with rates 52-149 BPM. Isolated PACs with rare couplets and  bigeminy. Multiple short runs of PAT/PSVT. Arrhythmia run 120 bpm for 204 beats. 14% of time was in A. fib/flutter. - Reviewed by Dr. Caryl Comes. Not thought to be significant enough for her syncope. Marland Kitchen KNEE ARTHROSCOPY WITH MEDIAL MENISECTOMY Right 06/24/2014  Procedure: RIGHT KNEE ARTHROSCOPY WITH partial lateral MENISECTOMY, abrasion chondroplasty of medial femoral condyle and patella, microfracture technique;  Surgeon: Latanya Maudlin, MD;  Location: [facility];  Service: Orthopedics;  Laterality: Right; . LEFT HEART CATHETERIZATION WITH CORONARY ANGIOGRAM N/A 07/06/2011  Procedure: LEFT HEART CATHETERIZATION WITH CORONARY ANGIOGRAM;  Surgeon: Leonie Man, MD;  Location: [facility] CATH LAB: Unstable Angina --> Myoview wiht Inf-Lat Ischemia.  Proximal OM1 lesion --> PCI; 100% mid RCA with right to right and left to right bridging collaterals from circumflex RPL and LAD the PDA. Marland Kitchen NM MYOVIEW LTD  October 2014   Low risk. Fixed basal inferior artifact normal EF. No ischemia --> (as compared to pre-PCI Myoview revealing inferolateral ischemia) . rotator cuff Right  . TRANSTHORACIC ECHOCARDIOGRAM  11/2014  EF 65-70%, Mod LVH. Normal wall motion. Gr 1 DD. Normal valves. Marland Kitchen VAGINAL HYSTERECTOMY   HPI: AAYLIAH ROTENBERRY is an 84 y.o. female who has been in short term rehab for reconditioning and IV abx for UTI due to baseline debility post stroke in 2016. Baseline mRS 4, as she needs help with all ADLs, uses a walker or w/c currently per facility. The pt was given Xanex after her therapy on the afternoon of 5/25.  When pts daughter arrived for visit, she was not talking or responding normally and the daughter called EMS. Due to residual rt side weakness and facial droop, EMS called a pre-arrival code stroke. Upon arrival she was unable to answer questions, lethargic and dysarthric speech. She had a right side facial droop and not using right side as briskly. CTH shows sevral old bilateral infarcts. No acute infarcts seen nor LVO. Swallow  evaluation ordered.  Subjective: MRI showed L MCA infarct Assessment / Plan / Recommendation CHL IP CLINICAL IMPRESSIONS 07/23/2017 Clinical Impression Patient presents with moderate oropharyngeal dysphagia due to sensory and motor deficits; there is sensed aspiration of thin liquids and straw sips of nectar. Oral stage characterized by impaired mastication, decreased bolus cohesion, delayed oral transit. There is mild-moderate lingual residue with solids, as well as mild oral holding with liquids, right anterior spillage with thin liquids due to decreased labial seal. Swallow initiation delayed to valleculae for most consistencies, but pyriform sinuses with thin liquids and straw sips of nectar. There is decreased/sluggish hyolarygneal excursion with delayed closure of laryngeal vestibule leading to penetration and aspiration before the swallow thin and straw sips of nectar. Pt did elicit a cough response which cleared remaining penetration but not aspiration. Chin tuck did not prevent deep penetration/questionable trace aspiration of thin liquids. No penetration/aspiration with cup sips of nectar. No abnormal residue remained in the pharynx after the swallow, with the exception of barium tablet which was retained in  the valleculae and cleared with reflexive subsequent swallow. Cervical esophageal phase was unremarkable. Limited esophageal sweep performed due to positioning constraints. Recommend initiating dys 2, nectar thick liquids, NO STRAWS, meds whole with nectar thick liquid. Pt would benefit from interventions to address strengthening of hyolaryngeal complex, cough. Will continue to follow. MD, please order SLP cognitive-linguistic evaluation.  SLP Visit Diagnosis Dysphagia, oropharyngeal phase (R13.12) Attention and concentration deficit following -- Frontal lobe and executive function deficit following -- Impact on safety and function Mild aspiration risk;Moderate aspiration risk   CHL IP TREATMENT  RECOMMENDATION 07/23/2017 Treatment Recommendations Therapy as outlined in treatment plan below;F/U MBS in --- days (Comment)   Prognosis 07/23/2017 Prognosis for Safe Diet Advancement Good Barriers to Reach Goals Language deficits Barriers/Prognosis Comment -- CHL IP DIET RECOMMENDATION 07/23/2017 SLP Diet Recommendations Dysphagia 2 (Fine chop) solids;Nectar thick liquid Liquid Administration via Cup;No straw Medication Administration Whole meds with liquid Compensations Slow rate;Small sips/bites Postural Changes Seated upright at 90 degrees   CHL IP OTHER RECOMMENDATIONS 07/23/2017 Recommended Consults Other (Comment) Oral Care Recommendations Oral care BID Other Recommendations Remove water pitcher;Have oral suction available   CHL IP FOLLOW UP RECOMMENDATIONS 07/23/2017 Follow up Recommendations Other (comment)   CHL IP FREQUENCY AND DURATION 07/23/2017 Speech Therapy Frequency (ACUTE ONLY) min 2x/week Treatment Duration 2 weeks      CHL IP ORAL PHASE 07/23/2017 Oral Phase Impaired Oral - Pudding Teaspoon -- Oral - Pudding Cup -- Oral - Honey Teaspoon WFL Oral - Honey Cup -- Oral - Nectar Teaspoon Holding of bolus Oral - Nectar Cup Holding of bolus Oral - Nectar Straw Holding of bolus Oral - Thin Teaspoon Right anterior bolus loss;Holding of bolus;Decreased bolus cohesion Oral - Thin Cup Right anterior bolus loss;Decreased bolus cohesion Oral - Thin Straw -- Oral - Puree Decreased bolus cohesion;Delayed oral transit Oral - Mech Soft -- Oral - Regular Impaired mastication;Reduced posterior propulsion;Delayed oral transit;Decreased bolus cohesion Oral - Multi-Consistency -- Oral - Pill Delayed oral transit;Decreased bolus cohesion Oral Phase - Comment --  CHL IP PHARYNGEAL PHASE 07/23/2017 Pharyngeal Phase Impaired Pharyngeal- Pudding Teaspoon -- Pharyngeal -- Pharyngeal- Pudding Cup -- Pharyngeal -- Pharyngeal- Honey Teaspoon Delayed swallow initiation-vallecula;Reduced anterior laryngeal mobility;Reduced laryngeal  elevation Pharyngeal -- Pharyngeal- Honey Cup -- Pharyngeal -- Pharyngeal- Nectar Teaspoon Delayed swallow initiation-vallecula;Reduced anterior laryngeal mobility;Reduced laryngeal elevation Pharyngeal -- Pharyngeal- Nectar Cup Delayed swallow initiation-vallecula;Reduced laryngeal elevation;Reduced anterior laryngeal mobility Pharyngeal -- Pharyngeal- Nectar Straw Delayed swallow initiation-pyriform sinuses;Reduced anterior laryngeal mobility;Reduced laryngeal elevation;Reduced airway/laryngeal closure;Penetration/Aspiration before swallow;Trace aspiration Pharyngeal Material enters airway, passes BELOW cords and not ejected out despite cough attempt by patient Pharyngeal- Thin Teaspoon Delayed swallow initiation-vallecula;Reduced laryngeal elevation;Reduced anterior laryngeal mobility Pharyngeal -- Pharyngeal- Thin Cup Delayed swallow initiation-pyriform sinuses;Reduced anterior laryngeal mobility;Reduced laryngeal elevation;Reduced airway/laryngeal closure;Penetration/Aspiration before swallow;Moderate aspiration Pharyngeal Material enters airway, passes BELOW cords and not ejected out despite cough attempt by patient Pharyngeal- Thin Straw -- Pharyngeal -- Pharyngeal- Puree Delayed swallow initiation-vallecula;Reduced epiglottic inversion;Reduced anterior laryngeal mobility Pharyngeal -- Pharyngeal- Mechanical Soft -- Pharyngeal -- Pharyngeal- Regular Delayed swallow initiation-vallecula;Reduced anterior laryngeal mobility;Reduced laryngeal elevation Pharyngeal -- Pharyngeal- Multi-consistency -- Pharyngeal -- Pharyngeal- Pill Delayed swallow initiation-vallecula;Pharyngeal residue - valleculae;Reduced anterior laryngeal mobility;Reduced laryngeal elevation Pharyngeal -- Pharyngeal Comment --  CHL IP CERVICAL ESOPHAGEAL PHASE 07/23/2017 Cervical Esophageal Phase WFL Pudding Teaspoon -- Pudding Cup -- Honey Teaspoon -- Honey Cup -- Nectar Teaspoon -- Nectar Cup -- Nectar Straw -- Thin Teaspoon -- Thin Cup -- Thin  Straw -- Puree -- Mechanical Soft -- Regular --  Multi-consistency -- Pill -- Cervical Esophageal Comment -- No flowsheet data found. Aliene Altes 07/23/2017, 11:08 AM  Deneise Lever, MS, CCC-SLP Speech-Language Pathologist [phone]             Dg Hip Unilat W Or Wo Pelvis 2-3 Views Right  Result Date: 07/21/2017 CLINICAL DATA:  Right leg pain.  Patient is unresponsive. EXAM: DG HIP (WITH OR WITHOUT PELVIS) 2-3V RIGHT COMPARISON:  None. FINDINGS: Severe degenerative changes in the right hip with loss of superior acetabular joint space and bone-on-bone appearance. There is remodeling and sclerosis of the right femoral head. This may indicate severe degenerative changes or avascular necrosis. No evidence of acute fracture or dislocation of the pelvis or right hip. SI joints and symphysis pubis are not displaced. Residual contrast material in the bladder. Degenerative changes in the lower lumbar spine and in the left hip. Vascular calcifications. IMPRESSION: Severe degenerative changes in the right hip. Remodeling and sclerosis of the right femoral head may indicate severe degenerative changes or avascular necrosis. No acute fracture or dislocation. Electronically Signed   By: Lucienne Capers M.D.   On: 07/21/2017 21:15   Ct Head Code Stroke Wo Contrast  Result Date: 07/21/2017 CLINICAL DATA:  Code stroke. New onset of right-sided weakness. The patient is on blood thinners. EXAM: CT HEAD WITHOUT CONTRAST TECHNIQUE: Contiguous axial images were obtained from the base of the skull through the vertex without intravenous contrast. COMPARISON:  CT head without contrast 01/20/2017. FINDINGS: Brain: Paraventricular white matter disease similar the prior exam. A remote lacunar infarct of the right internal capsule is stable. Remote lacunar infarcts are present in the thalami bilaterally. A remote lacunar infarct is present in the left caudate head. No new infarcts are present. The insular cortex is intact. The  brainstem and cerebellum are normal. Vascular: Atherosclerotic calcifications are present within the cavernous internal carotid arteries bilaterally. There is no hyperdense vessel. Skull: Calvarium is intact. Sinuses/Orbits: A fluid level is present within the sphenoid sinuses. Paranasal sinuses are otherwise clear. The mastoid air cells are clear. ASPECTS ([place] Stroke Program Early CT Score) - Ganglionic level infarction (caudate, lentiform nuclei, internal capsule, insula, M1-M3 cortex): 7/7 - Supraganglionic infarction (M4-M6 cortex): 3/3 Total score (0-10 with 10 being normal): 10/10 IMPRESSION: 1. Stable appearance of remote lacunar infarcts involving the basal ganglia bilaterally and the right internal capsule. 2. No acute intracranial abnormality. 3. Stable atrophy and white matter disease. This likely reflects the sequela of chronic microvascular ischemia. 4. Sphenoid sinus disease. 5. ASPECTS is 10/10 The above was relayed via text pager to Dr. Lorraine Lax on 07/21/2017 at 17:59 . Electronically Signed   By: San Morelle M.D.   On: 07/21/2017 18:00   [place] Ekg Site Rite  Result Date: 07/07/2017 If Site Rite image not attached, placement could not be confirmed due to current cardiac rhythm.    Phillips Climes M.D on 07/23/2017 at 12:01 PM  Between 7am to 7pm - Pager - [phone]  After 7pm go to [email or web address] - password [facility]  [name] -  Office  [phone]

## 2017-07-23 NOTE — Evaluation (Signed)
Physical Therapy Evaluation Patient Details Name: Abigail Boyd MRN: [id number] DOB: Nov 04, 1929 Today's Date: 07/23/2017   History of Present Illness  Pt is an 84 y/o female presenting with new onset speech difficulties. MRI revealed L MCA lenticulostriate infarct. PMH includes HTN, a fib, CKD 2, DM, dCHF, and CVA.   Clinical Impression  Pt admitted secondary to problem above with deficits below. Pt presenting with expressive aphasia, and R sided weakness. Required mod to max A for bed mobility and to stand at EOB. Required manual blocking on RLE to stand. Per daughter, pt will be going to SNF, however, would like to go to a different one. Will continue to follow acutely to maximize functional mobility independence and safety.     Follow Up Recommendations SNF    Equipment Recommendations  None recommended by PT    Recommendations for Other Services       Precautions / Restrictions Precautions Precautions: Fall Restrictions Weight Bearing Restrictions: No      Mobility  Bed Mobility Overal bed mobility: Needs Assistance Bed Mobility: Supine to Sit;Sit to Supine     Supine to sit: Mod assist Sit to supine: Max assist   General bed mobility comments: Mod a for LE assist and trunk elevation. Max A for trunk and LE assist to return to supine.   Transfers Overall transfer level: Needs assistance Equipment used: None Transfers: Sit to/from Stand Sit to Stand: Max assist;From elevated surface         General transfer comment: Max A to stand with PT standing in front of pt to block RLE. Increased pain in R knee, therefore unable to tolerate prolonged standing. Unable to grasp PT's arm with RUE.   Ambulation/Gait             General Gait Details: deferred   Stairs            Wheelchair Mobility    Modified Rankin (Stroke Patients Only)       Balance Overall balance assessment: Needs assistance Sitting-balance support: No upper extremity supported;Feet  supported Sitting balance-Leahy Scale: Fair     Standing balance support: Single extremity supported Standing balance-Leahy Scale: Poor Standing balance comment: Reliant on LUE and external support to stand.                              Pertinent Vitals/Pain Pain Assessment: Faces Faces Pain Scale: Hurts a little bit Pain Location: R knee Pain Descriptors / Indicators: Aching;Grimacing Pain Intervention(s): Limited activity within patient's tolerance;Monitored during session;Repositioned    Home Living Family/patient expects to be discharged to:: Skilled nursing facility                      Prior Function Level of Independence: Needs assistance   Gait / Transfers Assistance Needed: Was working with PT in SNF on ambulation with RW.   ADL's / Homemaking Assistance Needed: Was in SNF, so assume staff was assisting with ADLs.         Hand Dominance   Dominant Hand: Right    Extremity/Trunk Assessment   Upper Extremity Assessment Upper Extremity Assessment: Defer to OT evaluation(notable RUE weakness; grossly 2/3 throughout. )    Lower Extremity Assessment Lower Extremity Assessment: RLE deficits/detail RLE Deficits / Details: Grossly 2-3/5 throughout RLE.     Cervical / Trunk Assessment Cervical / Trunk Assessment: Kyphotic  Communication   Communication: Expressive difficulties  Cognition Arousal/Alertness: Awake/alert Behavior  During Therapy: Flat affect Overall Cognitive Status: Difficult to assess                                 General Comments: Pt able to respond with head shakes to yes or no questions. Noted expressive aphasia, so unable to respond verbally to questions.       General Comments General comments (skin integrity, edema, etc.): Pt's daughter present in room and assisted with questions about PLOF.     Exercises     Assessment/Plan    PT Assessment Patient needs continued PT services  PT Problem List  Decreased strength;Decreased range of motion;Decreased activity tolerance;Decreased knowledge of use of DME;Decreased balance;Decreased mobility;Decreased knowledge of precautions;Decreased safety awareness;Pain;Decreased coordination       PT Treatment Interventions DME instruction;Gait training;Functional mobility training;Therapeutic activities;Therapeutic exercise;Balance training;Patient/family education;Neuromuscular re-education    PT Goals (Current goals can be found in the Care Plan section)  Acute Rehab PT Goals Patient Stated Goal: to return to SNF per daughter  PT Goal Formulation: With family Time For Goal Achievement: 08/06/17 Potential to Achieve Goals: Fair    Frequency Min 3X/week   Barriers to discharge Decreased caregiver support      Co-evaluation               AM-PAC PT "6 Clicks" Daily Activity  Outcome Measure Difficulty turning over in bed (including adjusting bedclothes, sheets and blankets)?: Unable Difficulty moving from lying on back to sitting on the side of the bed? : Unable Difficulty sitting down on and standing up from a chair with arms (e.g., wheelchair, bedside commode, etc,.)?: Unable Help needed moving to and from a bed to chair (including a wheelchair)?: Total Help needed walking in hospital room?: Total Help needed climbing 3-5 steps with a railing? : Total 6 Click Score: 6    End of Session Equipment Utilized During Treatment: Gait belt Activity Tolerance: Patient limited by pain Patient left: in bed;with call bell/phone within reach;with bed alarm set;with family/visitor present Nurse Communication: Mobility status PT Visit Diagnosis: Hemiplegia and hemiparesis;Difficulty in walking, not elsewhere classified (R26.2);Muscle weakness (generalized) (M62.81);Unsteadiness on feet (R26.81);Pain Hemiplegia - Right/Left: Right Hemiplegia - dominant/non-dominant: Dominant Hemiplegia - caused by: Cerebral infarction Pain - Right/Left:  Right Pain - part of body: Knee    Time: 1437-1500 PT Time Calculation (min) (ACUTE ONLY): 23 min   Charges:   PT Evaluation $PT Eval Moderate Complexity: 1 Mod PT Treatments $Therapeutic Activity: 8-22 mins   PT G Codes:        [name], PT, DPT  Acute Rehabilitation Services  Pager: [phone]   [name] 07/23/2017, 4:05 PM

## 2017-07-23 NOTE — Progress Notes (Signed)
Subjective: Continues to have difficulty with speech.  MRI is positive for large thalamic looking  Exam: Vitals:   07/23/17 0650 07/23/17 0812  BP: (!) 145/75 (!) 158/76  Pulse:  89  Resp:  16  Temp:  98.2 F (36.8 C)  SpO2:  97%   Gen: In bed, NAD Resp: non-labored breathing, no acute distress Abd: soft, nt  Neuro: MS: Awake, alert, does not tell me her name but does name flashlight and tell me the color red, very little speech output but she does follow commands readily. CN: Pupils equal round and reactive, extraocular movements intact, fixates and tracks, moderate facial weakness Motor: She has right arm and leg weakness, 4/5, with encouragement, she gets better strength, 5/5 on the left Sensory: She reports symmetric sensation  Pertinent Labs: BMP-elevated glucose, otherwise unremarkable  Impression: 84 year old female with thalamic lacunar stroke.  I suspect this is due to her small vessel risk factors including hypertension, hypercholesterolemia, diabetes as opposed to due to her atrial fibrillation.  Therefore, I do not feel strongly about changing her from Xarelto to another anticoagulant.  I do not know that there is any proven benefit of adding antiplatelets to anticoagulants.  The stroke is small, and I think the risk of hemorrhagic conversion is low and she is Artie been continued to be anticoagulated for several days since the stroke.  At this point, I do not think there is any need to continue holding it.  Given that she is anticoagulated, and it does not look like an embolic stroke, I do not think an echocardiogram would add much to her work-up at this time.  She has already had a CT angios of the head and neck so carotid Dopplers are not needed.  Recommendations: 1) PT, OT, ST 2) lipid panel with increased statin therapy for LDL is greater than 70 3) continue anticoagulation 4) begin BP control with goal of gradually reducing blood pressure to normotension over the  next week or 2 5) continue diabetes control with goal A1c less than 7 6) no further recommendations other than the above at this time, neurology is available for any further questions, please call with any concerns.  Roland Rack, MD [facility] [phone]  If 7pm- 7am, please page neurology on call as listed in [place].

## 2017-07-23 NOTE — NC FL2 (Signed)
[place] MEDICAID FL2 LEVEL OF CARE SCREENING TOOL     IDENTIFICATION  Patient Name: Abigail Boyd Birthdate: 1929-09-12 Sex: female Admission Date (Current Location): 07/21/2017  [facility] and [place] Number:  Herbalist and Address:  [facility] [place], [place] [address] [place], [place] [address]      Provider Number: [id number]  Attending Physician Name and Address:  Elgergawy, Silver Huguenin, MD  Relative Name and Phone Number:       Current Level of Care: Hospital Recommended Level of Care: [place] Prior Approval Number:    Date Approved/Denied:   PASRR Number: [id number]  Discharge Plan: SNF    Current Diagnoses: Patient Active Problem List   Diagnosis Date Noted  . Acute encephalopathy 07/21/2017  . Chronic diastolic CHF (congestive heart failure) ([place]) 07/21/2017  . Generalized weakness 04/23/2017  . Recurrent UTI 04/23/2017  . Dysuria 12/12/2016  . Anxiety 12/13/2015  . Allergic rhinitis 12/13/2015  . Orthostatic hypotension 05/30/2015  . History of cardioembolic cerebrovascular accident (CVA) 05/24/2015  . Urge incontinence 03/15/2015  . Bilateral lower extremity edema 03/15/2015  . Constipation 03/15/2015  . Right knee pain 03/15/2015  . CKD (chronic kidney disease), stage II   . Insulin-requiring or dependent type II diabetes mellitus ([place])   . Osteoporosis screening 06/10/2014  . PLMD (periodic limb movement disorder) 06/12/2013  . Obstructive sleep apnea 03/05/2013  . Tachy-brady syndrome: Rates range from 50-169 bpm in A. fib 12/20/2012  . History of syncope: Unclear etiology 12/19/2012  . Chronotropic incompetence - partially medication related 08/18/2012  . PAF (paroxysmal atrial fibrillation) (HCC);  CHA2DS2-VASc Score =6 on 15 mg Xarelto 05/16/2012    Class: Diagnosis of  . Long term current use of anticoagulant therapy 05/16/2012  . CAD-PCI OM1, 100% RCA (L-R colllaterals) 07/07/2011  .  Hypercholesteremia     Class: Diagnosis of  . Essential hypertension     Class: Diagnosis of  . Spinal stenosis     Class: History of  . Diabetes mellitus (HCC)     Class: Diagnosis of  . Benign endometrial hyperplasia 03/21/2011  . Diverticulitis 03/21/2011  . GERD (gastroesophageal reflux disease) 03/21/2011  . Glaucoma (increased eye pressure) 03/21/2011  . Goiter 03/21/2011  . Hemorrhoid 03/21/2011  . History of anemia 03/21/2011  . History of carpal tunnel syndrome 03/21/2011  . Osteopenia 03/21/2011  . Vitamin D deficiency disease 03/21/2011    Orientation RESPIRATION BLADDER Height & Weight     Self  Normal Incontinent, External catheter(placed 5/10) Weight: 178 lb 9.2 oz (81 kg) Height:  5\' 4"  (162.6 cm)  BEHAVIORAL SYMPTOMS/MOOD NEUROLOGICAL BOWEL NUTRITION STATUS      Incontinent Diet(DYS 2: Nectar thick liquids)  AMBULATORY STATUS COMMUNICATION OF NEEDS Skin   Limited Assist Verbally Normal                       Personal Care Assistance Level of Assistance  Bathing, Feeding, Dressing Bathing Assistance: Limited assistance Feeding assistance: Independent Dressing Assistance: Limited assistance     Functional Limitations Info  Sight, Hearing, Speech Sight Info: Adequate Hearing Info: Adequate Speech Info: Adequate    SPECIAL CARE FACTORS FREQUENCY  PT (By licensed PT), OT (By licensed OT)     PT Frequency: 3x OT Frequency: 3x            Contractures Contractures Info: Not present    Additional Factors Info  Code Status, Allergies Code Status Info: DNR Allergies Info:  Amlodipine, Clopidogrel Bisulfate, Kenalog Triamcinolone Acetonide, Lisinopril, Losartan, Sulfa Antibiotics           Current Medications (07/23/2017):  This is the current hospital active medication list Current Facility-Administered Medications  Medication Dose Route Frequency Provider Last Rate Last Dose  . 0.9 %  sodium chloride infusion  250 mL Intravenous PRN Opyd,  Ilene Qua, MD      . 0.9 %  sodium chloride infusion   Intravenous Continuous Elgergawy, Silver Huguenin, MD 40 mL/hr at 07/22/17 2220    . acetaminophen (TYLENOL) tablet 650 mg  650 mg Oral Q6H PRN Opyd, Ilene Qua, MD       Or  . acetaminophen (TYLENOL) suppository 650 mg  650 mg Rectal Q6H PRN Opyd, Ilene Qua, MD      . bisacodyl (DULCOLAX) suppository 10 mg  10 mg Rectal Daily PRN Opyd, Ilene Qua, MD      . docusate sodium (COLACE) capsule 100 mg  100 mg Oral QHS Opyd, Ilene Qua, MD   Stopped at 07/22/17 0052  . enoxaparin (LOVENOX) injection 80 mg  80 mg Subcutaneous Q12H Elgergawy, Silver Huguenin, MD      . FLUoxetine (PROZAC) capsule 20 mg  20 mg Oral Daily Metzger-Cihelka, Desiree, NP   20 mg at 07/23/17 1050  . gabapentin (NEURONTIN) capsule 300 mg  300 mg Oral TID Vianne Bulls, MD   300 mg at 07/23/17 1050  . insulin aspart (novoLOG) injection 0-15 Units  0-15 Units Subcutaneous Q4H Opyd, Ilene Qua, MD   2 Units at 07/23/17 0445  . insulin glargine (LANTUS) injection 50 Units  50 Units Subcutaneous QHS Vianne Bulls, MD   50 Units at 07/22/17 2151  . latanoprost (XALATAN) 0.005 % ophthalmic solution 1 drop  1 drop Both Eyes QHS Opyd, Ilene Qua, MD   1 drop at 07/22/17 2200  . magnesium oxide (MAG-OX) tablet 400 mg  400 mg Oral QHS Opyd, Ilene Qua, MD   Stopped at 07/22/17 0048  . mirabegron ER (MYRBETRIQ) tablet 50 mg  50 mg Oral Daily Opyd, Ilene Qua, MD   50 mg at 07/23/17 1050  . omega-3 acid ethyl esters (LOVAZA) capsule 1 g  1 g Oral Daily Opyd, Ilene Qua, MD      . ondansetron (ZOFRAN) tablet 4 mg  4 mg Oral Q6H PRN Opyd, Ilene Qua, MD       Or  . ondansetron (ZOFRAN) injection 4 mg  4 mg Intravenous Q6H PRN Opyd, Ilene Qua, MD      . pantoprazole (PROTONIX) EC tablet 40 mg  40 mg Oral Daily Opyd, Ilene Qua, MD   40 mg at 07/23/17 1050  . polyethylene glycol (MIRALAX / GLYCOLAX) packet 17 g  17 g Oral Daily PRN Opyd, Ilene Qua, MD      . rOPINIRole (REQUIP) tablet 0.5 mg  0.5 mg Oral QHS  Opyd, Ilene Qua, MD   Stopped at 07/22/17 0052  . sodium chloride flush (NS) 0.9 % injection 3 mL  3 mL Intravenous Q12H Opyd, Ilene Qua, MD   3 mL at 07/22/17 0905  . sodium chloride flush (NS) 0.9 % injection 3 mL  3 mL Intravenous Q12H Opyd, Ilene Qua, MD   3 mL at 07/23/17 1051  . sodium chloride flush (NS) 0.9 % injection 3 mL  3 mL Intravenous PRN Opyd, Ilene Qua, MD      . timolol (TIMOPTIC) 0.5 % ophthalmic solution 1 drop  1 drop Both Eyes Daily Opyd,  Ilene Qua, MD   1 drop at 07/23/17 1050     Discharge Medications: Please see discharge summary for a list of discharge medications.  Relevant Imaging Results:  Relevant Lab Results:   Additional Information SSN [id number]  Eileen Stanford, LCSW

## 2017-07-24 ENCOUNTER — Encounter (HOSPITAL_COMMUNITY): Payer: Self-pay | Admitting: *Deleted

## 2017-07-24 ENCOUNTER — Inpatient Hospital Stay (HOSPITAL_COMMUNITY): Payer: Medicare Other

## 2017-07-24 ENCOUNTER — Inpatient Hospital Stay: Payer: Medicare Other | Admitting: Family Medicine

## 2017-07-24 DIAGNOSIS — R4701 Aphasia: Secondary | ICD-10-CM

## 2017-07-24 DIAGNOSIS — N390 Urinary tract infection, site not specified: Secondary | ICD-10-CM

## 2017-07-24 DIAGNOSIS — I2510 Atherosclerotic heart disease of native coronary artery without angina pectoris: Secondary | ICD-10-CM

## 2017-07-24 LAB — URINE CULTURE: Culture: 50000 — AB

## 2017-07-24 LAB — GLUCOSE, CAPILLARY
GLUCOSE-CAPILLARY: 118 mg/dL — AB (ref 65–99)
Glucose-Capillary: 125 mg/dL — ABNORMAL HIGH (ref 65–99)
Glucose-Capillary: 164 mg/dL — ABNORMAL HIGH (ref 65–99)
Glucose-Capillary: 177 mg/dL — ABNORMAL HIGH (ref 65–99)
Glucose-Capillary: 196 mg/dL — ABNORMAL HIGH (ref 65–99)

## 2017-07-24 LAB — CBC
HEMATOCRIT: 39.5 % (ref 36.0–46.0)
Hemoglobin: 12.9 g/dL (ref 12.0–15.0)
MCH: 30.6 pg (ref 26.0–34.0)
MCHC: 32.7 g/dL (ref 30.0–36.0)
MCV: 93.8 fL (ref 78.0–100.0)
PLATELETS: 331 10*3/uL (ref 150–400)
RBC: 4.21 MIL/uL (ref 3.87–5.11)
RDW: 13.8 % (ref 11.5–15.5)
WBC: 10.5 10*3/uL (ref 4.0–10.5)

## 2017-07-24 LAB — LIPID PANEL
Cholesterol: 234 mg/dL — ABNORMAL HIGH (ref 0–200)
HDL: 33 mg/dL — ABNORMAL LOW (ref >40)
LDL CALC: 157 mg/dL — AB (ref 0–99)
Total CHOL/HDL Ratio: 7.1 RATIO
Triglycerides: 218 mg/dL — ABNORMAL HIGH (ref <150)
VLDL: 44 mg/dL — AB (ref 0–40)

## 2017-07-24 LAB — ECHOCARDIOGRAM COMPLETE
Height: 64 in
WEIGHTICAEL: 2888.91 [oz_av]

## 2017-07-24 LAB — BASIC METABOLIC PANEL
Anion gap: 10 (ref 5–15)
BUN: 18 mg/dL (ref 6–20)
CO2: 25 mmol/L (ref 22–32)
Calcium: 9.4 mg/dL (ref 8.9–10.3)
Chloride: 109 mmol/L (ref 101–111)
Creatinine, Ser: 1.13 mg/dL — ABNORMAL HIGH (ref 0.44–1.00)
GFR calc Af Amer: 50 mL/min — ABNORMAL LOW (ref >60)
GFR, EST NON AFRICAN AMERICAN: 43 mL/min — AB (ref >60)
GLUCOSE: 121 mg/dL — AB (ref 65–99)
POTASSIUM: 4.1 mmol/L (ref 3.5–5.1)
Sodium: 144 mmol/L (ref 135–145)

## 2017-07-24 LAB — FOLATE RBC
FOLATE, HEMOLYSATE: 437.6 ng/mL
FOLATE, RBC: 1209 ng/mL (ref >498)
Hematocrit: 36.2 % (ref 34.0–46.6)

## 2017-07-24 MED ORDER — SODIUM CHLORIDE 0.9 % IV SOLN
500.0000 mg | Freq: Two times a day (BID) | INTRAVENOUS | Status: DC
  Administered 2017-07-24 – 2017-07-25 (×3): 500 mg via INTRAVENOUS
  Filled 2017-07-24 (×3): qty 0.5

## 2017-07-24 MED ORDER — ATORVASTATIN CALCIUM 80 MG PO TABS
80.0000 mg | ORAL_TABLET | Freq: Every day | ORAL | Status: DC
  Administered 2017-07-24: 80 mg via ORAL
  Filled 2017-07-24: qty 1

## 2017-07-24 NOTE — Progress Notes (Signed)
  Speech Language Pathology Treatment: Dysphagia  Patient Details Name: Abigail Boyd MRN: [id number] DOB: November 07, 1929 Today's Date: 07/24/2017 Time: [id number] SLP Time Calculation (min) (ACUTE ONLY): 10 min  Assessment / Plan / Recommendation Clinical Impression  Skilled treatment session focused on dysphagia goals. SLP received pt in bed and pt required more than a reasonable amount of time to arouse. SLP further facilitated session by providing trials of ice chips. Pt with increased oral phase likely d/t decreased attention to bolus. Swallow initiation appeared delayed with cough/throat clear immediately after swallow. Pt was not able to follow compensatory strategies during this session. Recommend continuing current diet of dysphagia 2 with nectar thick liquids via cup NO STRAW, medicine crushed and full supervision during PO intake.    HPI HPI: Abigail Boyd is an 84 y.o. female who has been in short term rehab for reconditioning and IV abx for UTI due to baseline debility post stroke in 2016. Baseline mRS 4, as she needs help with all ADLs, uses a walker or w/c currently per facility. The pt was given Xanex after her therapy on the afternoon of 5/25.  When pts daughter arrived for visit, she was not talking or responding normally and the daughter called EMS. Due to residual rt side weakness and facial droop, EMS called a pre-arrival code stroke. Upon arrival she was unable to answer questions, lethargic and dysarthric speech. She had a right side facial droop and not using right side as briskly. CTH shows sevral old bilateral infarcts. No acute infarcts seen nor LVO. Swallow evaluation ordered.      SLP Plan  Continue with current plan of care       Recommendations  Diet recommendations: Dysphagia 2 (fine chop);Nectar-thick liquid Liquids provided via: Teaspoon;Cup;No straw Medication Administration: Crushed with puree Supervision: Staff to assist with self feeding;Full supervision/cueing  for compensatory strategies Compensations: Slow rate;Small sips/bites Postural Changes and/or Swallow Maneuvers: Seated upright 90 degrees                Oral Care Recommendations: Oral care QID Follow up Recommendations: Skilled Nursing facility SLP Visit Diagnosis: Dysphagia, oropharyngeal phase (R13.12) Plan: Continue with current plan of care       GO                Conswella Bruney 07/24/2017, 1:32 PM

## 2017-07-24 NOTE — Progress Notes (Addendum)
STROKE TEAM PROGRESS NOTE  HPI: ( Abigail Boyd is an 84 y.o. female who has extensive PMH, listed below. She has been in short term rehab for reconditioning and IV abx for UTI due to baseline debility post stroke in 2016. Baseline mRS 4, needs help with all ADLs, uses a walker or w/c currently per facility. Currently the pt is unable to provide any information. I spoke to the RN in charge of her care at her facility for further info. It seems that she went to therapy around 1pm and was at her baseline during rehab with PT/OT. The nurse reports she was normal after therapy, but asked for a Xanex and was quite anxious, tearful. Nurse at the facility says she gave her "two" Xanex.  When pts daughter arrived for visit, she was not talking or responding normally and the daughter called EMS. Due to residual rt side weakness and facial droop EMS called a code stroke. Upon arrival she was unable to answer questions, lethargic and dysarthric speech. CTH shows sevral old bilateral infarcts. No acute infarcts seen nor LVO. Thus no acute stroke treatments offered. I also confirmed with daughter that she has chronic right side facial droop, but seems that her old rt side weakness had resolved for most part, but does note that rt side seems weaker when tired or sick.   Date last known well: 07/21/17 Time last known well: 1300  tPA Given: no, pt is on Xarelto. Last dose was on time at 5pm per facility nurse. No doses missed. No LVO    INTERVAL HISTORY Her RN is at the bedside.  He reported increased cough with lunch. Will have SLP reassess. Patient from SNF. Daughter told Abigail. Leonie Man she was unsure about timing of Xarelto administration there. Pt at SNF x 2 weeks. Plans return there.    Vitals:   07/23/17 2331 07/24/17 0401 07/24/17 0402 07/24/17 0748  BP: (!) 156/84 (!) 146/74  (!) 159/79  Pulse: (!) 103 95  96  Resp: 20 18  18   Temp: 98.9 F (37.2 C) 98.2 F (36.8 C)  98.2 F (36.8 C)  TempSrc:  Oral Oral    SpO2: 94% 94%  97%  Weight:   81.9 kg (180 lb 8.9 oz)   Height:        CBC:  Recent Labs  Lab 07/21/17 1743  07/22/17 1526 07/24/17 0507  WBC 10.1  --  12.3* 10.5  NEUTROABS 7.6  --   --   --   HGB 13.8   < > 15.0 12.9  HCT 43.3   < > 46.4* 39.5  MCV 94.5  --  94.5 93.8  PLT 323  --  339 331   < > = values in this interval not displayed.    Basic Metabolic Panel:  Recent Labs  Lab 07/22/17 1526 07/24/17 0507  NA 138 144  K 4.6 4.1  CL 98* 109  CO2 24 25  GLUCOSE 242* 121*  BUN 14 18  CREATININE 0.99 1.13*  CALCIUM 9.8 9.4   Lipid Panel:     Component Value Date/Time   CHOL 234 (H) 07/24/2017 0507   CHOL 177 12/18/2012 0941   TRIG 218 (H) 07/24/2017 0507   HDL 33 (L) 07/24/2017 0507   HDL 50 12/18/2012 0941   CHOLHDL 7.1 07/24/2017 0507   VLDL 44 (H) 07/24/2017 0507   LDLCALC 157 (H) 07/24/2017 0507   LDLCALC 90 12/18/2012 0941   HgbA1c:  Lab Results  Component Value Date   HGBA1C 7.8 (H) 07/22/2017   Urine Drug Screen: No results found for: LABOPIA, COCAINSCRNUR, LABBENZ, AMPHETMU, THCU, LABBARB  Alcohol Level No results found for: [facility]  IMAGING Mr Brain Wo Contrast  Result Date: 07/22/2017 CLINICAL DATA:  Acute somnolence and speech difficulty. Chronic RIGHT-sided motor deficits. EXAM: MRI HEAD WITHOUT CONTRAST TECHNIQUE: Multiplanar, multiecho pulse sequences of the brain and surrounding structures were obtained without intravenous contrast. COMPARISON:  CT head and CTA head neck 07/21/2017. FINDINGS: Brain: Restricted diffusion affects the LEFT lentiform nucleus, and periventricular white matter, consistent with an acute LEFT MCA lenticulostriate infarct. No hemorrhage, mass lesion, or extra-axial fluid. Hydrocephalus ex vacuo. Atrophy with small vessel disease. Chronic lacunar infarcts affect the BILATERAL thalamus and BILATERAL basal ganglia. Vascular: Flow voids are maintained. Skull and upper cervical spine: Normal marrow signal. Pannus  surrounds C1-C2. Sinuses/Orbits: No layering sinus fluid.  Negative orbits. Other: None. IMPRESSION: Acute LEFT MCA lenticulostriate territory infarct, nonhemorrhagic. Involvement of the LEFT lentiform nucleus and periventricular white matter. Chronic changes as described. Electronically Signed   By: Staci Righter M.D.   On: 07/22/2017 19:30   Dg Swallowing Func-speech Pathology  Result Date: 07/23/2017 Objective Swallowing Evaluation: Type of Study: MBS-Modified Barium Swallow Study  Patient Details Name: Abigail Boyd MRN: [id number] Date of Birth: 1929-08-06 Today's Date: 07/23/2017 Time: SLP Start Time (ACUTE ONLY): 0935 -SLP Stop Time (ACUTE ONLY): 1004 SLP Time Calculation (min) (ACUTE ONLY): 29 min Past Medical History: Past Medical History: Diagnosis Date . Abnormal nuclear stress test 06/21/2011  Inferolateral reversible defect;--> cardiac cath & PCI of CxOM, occluded RCA with collaterals;; followup Myoview 11/2012: Low risk. Fixed basal inferior artifact normal EF. No ischemia . Anxiety   When necessary Xanax . Anxiety  . Asthma  . Bilateral cataracts   Status post stroke or correction . CAD S/P percutaneous coronary angioplasty 06/2011  s/P PCI to proximal OM1 w/ DES; occluded RCA with bridging and L-R collaterals (medical management) . Chronic kidney disease (CKD), stage II (mild)   Related to current bladder infections (although diabetes cannot be excluded) . Chronotropic incompetence with sinus node dysfunction [facility]) October 2013  On CPET test; beta blockers reduced . Diabetes mellitus type 2, controlled ([place])   On oral medications . Diverticulitis  . Essential hypertension   Allowing for permissive hypertension to avoid orthostatic hypotension . GERD (gastroesophageal reflux disease)   On PPI . Glaucoma  . History of syncope   Per EP - neurocardiogenic & not Bradycardia related (no PPM) . History of unstable angina 06/13/2011  Jaw pain awakening from sleep -- Myoview --CATH --> PCI . Hyperlipidemia  with target LDL less than 70   HDL at goal, LDL not at goal, borderline triglycerides. On Crestor 20 mg . Migraines  . OSA (obstructive sleep apnea)   hx bladder infections . Osteoarthritis  . PAF (paroxysmal atrial fibrillation) ([place]) 10-11/ 2014  CardioNet Event Monitor: NSR & S Brady -- Rates 50-100; Total A. fib burden 35 hours and 27 minutes. 1296 episodes, longest was 1 hour 29 minutes. Rate ranged from 52-169 beats per minute. . Seasonal allergies  . Shortness of breath on exertion October 2013  2-D echo: Normal EF>55%, Gr 1DD, mild aortic sclerosis; Evaluated with CPET - peak VO2 97%; Chronotropic Incompetence (submaximal effort) . Spinal stenosis of lumbar region 11/2011 . Stroke ([place])  . Tachycardia-bradycardia syndrome ([place]) 11/2012 . Urge incontinence  Past Surgical History: Past Surgical History: Procedure Laterality Date . APPENDECTOMY   . BREAST  BIOPSY    both breast . cataracts    both eyes . COLONOSCOPY   . CORONARY ANGIOPLASTY WITH STENT PLACEMENT  07/05/2011  Promus Element DES 2.5 mm x 16 mm-post dilated to 2.65 mm CX-Proximal OM1 . Holter Monitor  04/2015  Sinus rhythm with rates 52-149 BPM. Isolated PACs with rare couplets and bigeminy. Multiple short runs of PAT/PSVT. Arrhythmia run 120 bpm for 204 beats. 14% of time was in A. fib/flutter. - Reviewed by Abigail. Caryl Comes. Not thought to be significant enough for her syncope. Marland Kitchen KNEE ARTHROSCOPY WITH MEDIAL MENISECTOMY Right 06/24/2014  Procedure: RIGHT KNEE ARTHROSCOPY WITH partial lateral MENISECTOMY, abrasion chondroplasty of medial femoral condyle and patella, microfracture technique;  Surgeon: Latanya Maudlin, MD;  Location: [facility];  Service: Orthopedics;  Laterality: Right; . LEFT HEART CATHETERIZATION WITH CORONARY ANGIOGRAM N/A 07/06/2011  Procedure: LEFT HEART CATHETERIZATION WITH CORONARY ANGIOGRAM;  Surgeon: Leonie Man, MD;  Location: [facility] CATH LAB: Unstable Angina --> Myoview wiht Inf-Lat Ischemia.  Proximal OM1 lesion --> PCI; 100% mid RCA  with right to right and left to right bridging collaterals from circumflex RPL and LAD the PDA. Marland Kitchen NM MYOVIEW LTD  October 2014   Low risk. Fixed basal inferior artifact normal EF. No ischemia --> (as compared to pre-PCI Myoview revealing inferolateral ischemia) . rotator cuff Right  . TRANSTHORACIC ECHOCARDIOGRAM  11/2014  EF 65-70%, Mod LVH. Normal wall motion. Gr 1 DD. Normal valves. Marland Kitchen VAGINAL HYSTERECTOMY   HPI: Abigail Boyd is an 84 y.o. female who has been in short term rehab for reconditioning and IV abx for UTI due to baseline debility post stroke in 2016. Baseline mRS 4, as she needs help with all ADLs, uses a walker or w/c currently per facility. The pt was given Xanex after her therapy on the afternoon of 5/25.  When pts daughter arrived for visit, she was not talking or responding normally and the daughter called EMS. Due to residual rt side weakness and facial droop, EMS called a pre-arrival code stroke. Upon arrival she was unable to answer questions, lethargic and dysarthric speech. She had a right side facial droop and not using right side as briskly. CTH shows sevral old bilateral infarcts. No acute infarcts seen nor LVO. Swallow evaluation ordered.  Subjective: MRI showed L MCA infarct Assessment / Plan / Recommendation CHL IP CLINICAL IMPRESSIONS 07/23/2017 Clinical Impression Patient presents with moderate oropharyngeal dysphagia due to sensory and motor deficits; there is sensed aspiration of thin liquids and straw sips of nectar. Oral stage characterized by impaired mastication, decreased bolus cohesion, delayed oral transit. There is mild-moderate lingual residue with solids, as well as mild oral holding with liquids, right anterior spillage with thin liquids due to decreased labial seal. Swallow initiation delayed to valleculae for most consistencies, but pyriform sinuses with thin liquids and straw sips of nectar. There is decreased/sluggish hyolarygneal excursion with delayed closure of  laryngeal vestibule leading to penetration and aspiration before the swallow thin and straw sips of nectar. Pt did elicit a cough response which cleared remaining penetration but not aspiration. Chin tuck did not prevent deep penetration/questionable trace aspiration of thin liquids. No penetration/aspiration with cup sips of nectar. No abnormal residue remained in the pharynx after the swallow, with the exception of barium tablet which was retained in the valleculae and cleared with reflexive subsequent swallow. Cervical esophageal phase was unremarkable. Limited esophageal sweep performed due to positioning constraints. Recommend initiating dys 2, nectar thick liquids, NO STRAWS, meds whole with  nectar thick liquid. Pt would benefit from interventions to address strengthening of hyolaryngeal complex, cough. Will continue to follow. MD, please order SLP cognitive-linguistic evaluation.  SLP Visit Diagnosis Dysphagia, oropharyngeal phase (R13.12) Attention and concentration deficit following -- Frontal lobe and executive function deficit following -- Impact on safety and function Mild aspiration risk;Moderate aspiration risk   CHL IP TREATMENT RECOMMENDATION 07/23/2017 Treatment Recommendations Therapy as outlined in treatment plan below;F/U MBS in --- days (Comment)   Prognosis 07/23/2017 Prognosis for Safe Diet Advancement Good Barriers to Reach Goals Language deficits Barriers/Prognosis Comment -- CHL IP DIET RECOMMENDATION 07/23/2017 SLP Diet Recommendations Dysphagia 2 (Fine chop) solids;Nectar thick liquid Liquid Administration via Cup;No straw Medication Administration Whole meds with liquid Compensations Slow rate;Small sips/bites Postural Changes Seated upright at 90 degrees   CHL IP OTHER RECOMMENDATIONS 07/23/2017 Recommended Consults Other (Comment) Oral Care Recommendations Oral care BID Other Recommendations Remove water pitcher;Have oral suction available   CHL IP FOLLOW UP RECOMMENDATIONS 07/23/2017  Follow up Recommendations Other (comment)   CHL IP FREQUENCY AND DURATION 07/23/2017 Speech Therapy Frequency (ACUTE ONLY) min 2x/week Treatment Duration 2 weeks      CHL IP ORAL PHASE 07/23/2017 Oral Phase Impaired Oral - Pudding Teaspoon -- Oral - Pudding Cup -- Oral - Honey Teaspoon WFL Oral - Honey Cup -- Oral - Nectar Teaspoon Holding of bolus Oral - Nectar Cup Holding of bolus Oral - Nectar Straw Holding of bolus Oral - Thin Teaspoon Right anterior bolus loss;Holding of bolus;Decreased bolus cohesion Oral - Thin Cup Right anterior bolus loss;Decreased bolus cohesion Oral - Thin Straw -- Oral - Puree Decreased bolus cohesion;Delayed oral transit Oral - Mech Soft -- Oral - Regular Impaired mastication;Reduced posterior propulsion;Delayed oral transit;Decreased bolus cohesion Oral - Multi-Consistency -- Oral - Pill Delayed oral transit;Decreased bolus cohesion Oral Phase - Comment --  CHL IP PHARYNGEAL PHASE 07/23/2017 Pharyngeal Phase Impaired Pharyngeal- Pudding Teaspoon -- Pharyngeal -- Pharyngeal- Pudding Cup -- Pharyngeal -- Pharyngeal- Honey Teaspoon Delayed swallow initiation-vallecula;Reduced anterior laryngeal mobility;Reduced laryngeal elevation Pharyngeal -- Pharyngeal- Honey Cup -- Pharyngeal -- Pharyngeal- Nectar Teaspoon Delayed swallow initiation-vallecula;Reduced anterior laryngeal mobility;Reduced laryngeal elevation Pharyngeal -- Pharyngeal- Nectar Cup Delayed swallow initiation-vallecula;Reduced laryngeal elevation;Reduced anterior laryngeal mobility Pharyngeal -- Pharyngeal- Nectar Straw Delayed swallow initiation-pyriform sinuses;Reduced anterior laryngeal mobility;Reduced laryngeal elevation;Reduced airway/laryngeal closure;Penetration/Aspiration before swallow;Trace aspiration Pharyngeal Material enters airway, passes BELOW cords and not ejected out despite cough attempt by patient Pharyngeal- Thin Teaspoon Delayed swallow initiation-vallecula;Reduced laryngeal elevation;Reduced anterior  laryngeal mobility Pharyngeal -- Pharyngeal- Thin Cup Delayed swallow initiation-pyriform sinuses;Reduced anterior laryngeal mobility;Reduced laryngeal elevation;Reduced airway/laryngeal closure;Penetration/Aspiration before swallow;Moderate aspiration Pharyngeal Material enters airway, passes BELOW cords and not ejected out despite cough attempt by patient Pharyngeal- Thin Straw -- Pharyngeal -- Pharyngeal- Puree Delayed swallow initiation-vallecula;Reduced epiglottic inversion;Reduced anterior laryngeal mobility Pharyngeal -- Pharyngeal- Mechanical Soft -- Pharyngeal -- Pharyngeal- Regular Delayed swallow initiation-vallecula;Reduced anterior laryngeal mobility;Reduced laryngeal elevation Pharyngeal -- Pharyngeal- Multi-consistency -- Pharyngeal -- Pharyngeal- Pill Delayed swallow initiation-vallecula;Pharyngeal residue - valleculae;Reduced anterior laryngeal mobility;Reduced laryngeal elevation Pharyngeal -- Pharyngeal Comment --  CHL IP CERVICAL ESOPHAGEAL PHASE 07/23/2017 Cervical Esophageal Phase WFL Pudding Teaspoon -- Pudding Cup -- Honey Teaspoon -- Honey Cup -- Nectar Teaspoon -- Nectar Cup -- Nectar Straw -- Thin Teaspoon -- Thin Cup -- Thin Straw -- Puree -- Mechanical Soft -- Regular -- Multi-consistency -- Pill -- Cervical Esophageal Comment -- No flowsheet data found. Aliene Altes 07/23/2017, 11:08 AM  Deneise Lever, [place], [place] Speech-Language Pathologist [phone]  PHYSICAL EXAM  pleasant elderly Caucasian lady currently not in distress. . Afebrile. Head is nontraumatic. Neck is supple without bruit.    Cardiac exam no murmur or gallop. Lungs are clear to auscultation. Distal pulses are well felt.  Neurological Exam : : MS: Awake, alert, cannot name where she is but knows she is in the hospital. Knows her name and age. Can name objects without difficulty. Can repeat. Paucity of speech. Follow  commands readily. Becomes tearful easily, admits to being sad. Severely  dysarthric. CN: Pupils equal round and reactive, extraocular movements intact, fixates and tracks, moderate R facial weakness. Coughs with pudding. Motor: She has mild right arm and leg weakness, 4/5, with encouragement, she gets better strength, 5/5 on the left Sensory: She reports symmetric sensation   ASSESSMENT/PLAN Abigail Boyd is a 84 y.o. female with history of previous stroke, CHF, PAF on xarelto, DM, CKD presenting with somnolence and speech difficulties.   Stroke:  Large left thalamic infarct embolic secondary to known atrial fibrillation on Xarelto  Code Stroke CT head No acute stroke. Old B BG and R IC infarcts. Small vessel disease. Atrophy. Sphenoid sinus dz. ASPECTS 10.     CTA head & neck atherosclerosis of L subclavian, cavernous B ICA. Vessel irregularity L>R mid cervical. Mod stenosis L A2/3. Mild CS degenerative changes. Ground glass lung B apices.  MRI  Acute LEFT MCA lenticulostriate territory infarct, nonhemorrhagic w/ involvement of the LEFT lentiform nucleus and periventricular white matter.  2D Echo  ordered  LDL 157  HgbA1c 7.8  xarelto for VTE prophylaxis  Xarelto (rivaroxaban) daily prior to admission, now on Xarelto (rivaroxaban) daily. Continue at d/c  Therapy recommendations:  SNF. SLP added for language  Disposition:  pending   Atrial Fibrillation  Home anticoagulation:  Xarelto (rivaroxaban) daily continued in the hospital  CHA2DS2-VASc Score = 9, ?2 oral anticoagulation recommended  Age in Years:  ?44   +2    Sex:  Female   Female   +1    Hypertension History:  yes   +1     Diabetes Mellitus:  yes   +1  Congestive Heart Failure History:  yes   +1  Vascular Disease History:  yes   +1     Stroke/TIA/Thromboembolism History:  yes   +2  Continue xarelto at discharge   Dysphagia  Secondary to stroke  Coughed with pudding  SLP to reassess  Diet Order              DIET DYS 2 Room service appropriate? Yes; Fluid consistency:  Nectar Thick  Diet effective now           Hypertension  Stable . Permissive hypertension (OK if < 220/120) but gradually normalize in 5-7 days . Long-term BP goal normotensive  Hyperlipidemia  Home meds:  Fish oil (no statin)   LDL 157, goal < 70  Now on lovaza.   No documented allergy to statin. Agree with addition of lipitor 80  Continue statin at discharge  Diabetes type II  HgbA1c 7.8, goal < 7.0  Uncontrolled  Other Stroke Risk Factors  Advanced age  Obesity, Body mass index is 30.99 kg/m., recommend weight loss, diet and exercise as appropriate   Hx stroke/TIA  11/2014 - L frontal cortical and subcortical infarcts secondary to PAF  Family hx stroke (father)  Coronary artery disease  Chronic diastolic CHF  obstructive sleep apnea   Other Active Problems  ESBL UTI   CKD stage II  NOTHING FURTHER TO ADD FROM THE STROKE STANDPOINT  Stroke Service will sign off. Please call should any needs arise.  Follow-up Stroke Clinic at [facility] in 4 weeks, order placed.    Hospital day # 1  Burnetta Sabin, MSN, APRN, ANVP-BC, AGPCNP-BC Advanced Practice Stroke Nurse [address] for Schedule & Pager information 07/24/2017 2:50 PM  I have personally examined this patient, reviewed notes, independently viewed imaging studies, participated in medical decision making and plan of care.ROS completed by me personally and pertinent positives fully documented  I have made any additions or clarifications directly to the above note. Agree with note above.  She has presented with severe dysarthria and right face and body weakness secondary to left thalamic infarct with known atrial fibrillation despite being on Xarelto. I discussed with the patient and daughter alternative to Xarelto and lack of definite data suggesting any superiority. After discussion of risks benefits patient and family preferred to stay on Xarelto. Continue  ongoing physical occupational speech therapy. Transfer to rehabilitation facility in the next few days. Discuss with Abigail. Gloris Ham.Greater than 50% time during this 35 minute visit was spent on counseling and coordination of care about her embolic stroke and dysarthria and answering questions  Antony Contras, MD Medical Director [employer] Pager: [phone] 07/24/2017 3:06 PM  To contact Stroke Continuity provider, please refer to [email or web address] After hours, contact General Neurology

## 2017-07-24 NOTE — Evaluation (Signed)
Occupational Therapy Evaluation Patient Details Name: Abigail Boyd MRN: [id number] DOB: 11/22/1929 Today's Date: 07/24/2017    History of Present Illness Pt is an 84 y/o female presenting with new onset speech difficulties. MRI revealed L MCA lenticulostriate infarct. PMH includes HTN, a fib, CKD 2, DM, dCHF, and CVA.    Clinical Impression   Pt with decline in function and safety with ADLs and ADL mobility with decreased functional use of R UE. Pt previously at SNF for rehab and currently requires extensive assist with mobility and ADLs. Pt would benefit from acute OT services to address impairments to maximize level of function and safety    Follow Up Recommendations  SNF    Equipment Recommendations  Other (comment)(TBD at next venue of care)    Recommendations for Other Services       Precautions / Restrictions Precautions Precautions: Fall Restrictions Weight Bearing Restrictions: No      Mobility Bed Mobility Overal bed mobility: Needs Assistance Bed Mobility: Supine to Sit;Sit to Supine     Supine to sit: Mod assist Sit to supine: Max assist   General bed mobility comments: Mod a for LE assist and trunk elevation. Max A for trunk and LE assist to return to supine.   Transfers Overall transfer level: Needs assistance               General transfer comment: NT, per PT note pt is max A with transfers    Balance Overall balance assessment: Needs assistance Sitting-balance support: No upper extremity supported;Feet supported Sitting balance-Leahy Scale: Fair                                     ADL either performed or assessed with clinical judgement   ADL Overall ADL's : Needs assistance/impaired     Grooming: Total assistance   Upper Body Bathing: Total assistance   Lower Body Bathing: Total assistance   Upper Body Dressing : Total assistance   Lower Body Dressing: Total assistance   Toilet Transfer: Maximal assistance Toilet  Transfer Details (indicate cue type and reason): NT, per PT note pt is max A with transfers [place] and Hygiene: Total assistance;Bed level       Functional mobility during ADLs: Maximal assistance(NT, per PT note pt is max A with transfers) General ADL Comments: came from SNF with staff assisting with ADLs PTA?     Vision Baseline Vision/History: (unable to properlt assess at this time) Vision Assessment?: Vision impaired- to be further tested in functional context     Perception     Praxis      Pertinent Vitals/Pain Pain Assessment: Faces Faces Pain Scale: Hurts a little bit Pain Location: R knee with bed mobility Pain Descriptors / Indicators: Grimacing Pain Intervention(s): Limited activity within patient's tolerance;Monitored during session;Repositioned     Hand Dominance Right   Extremity/Trunk Assessment Upper Extremity Assessment Upper Extremity Assessment: Overall WFL for tasks assessed;RUE deficits/detail RUE Coordination: decreased fine motor;decreased gross motor   Lower Extremity Assessment Lower Extremity Assessment: Defer to PT evaluation   Cervical / Trunk Assessment Cervical / Trunk Assessment: Kyphotic   Communication Communication Communication: Expressive difficulties   Cognition Arousal/Alertness: Awake/alert Behavior During Therapy: Flat affect Overall Cognitive Status: Difficult to assess  General Comments: Pt able to respond with head shakes to yes or no questions. Noted expressive aphasia, so unable to respond verbally to questions.    General Comments       Exercises     Shoulder Instructions      Home Living Family/patient expects to be discharged to:: Skilled nursing facility   Available Help at Discharge: Family;Personal care attendant;Available PRN/intermittently Type of Home: [place] [facility]                                  Prior  Functioning/Environment Level of Independence: Needs assistance  Gait / Transfers Assistance Needed: Was working with PT in SNF on ambulation with RW.  ADL's / Homemaking Assistance Needed: Was in SNF, staff was assisting with ADLs?             OT Problem List: Decreased strength;Decreased activity tolerance;Decreased cognition;Decreased knowledge of use of DME or AE;Impaired tone;Impaired UE functional use;Decreased range of motion;Impaired balance (sitting and/or standing);Decreased coordination;Decreased safety awareness;Impaired sensation      OT Treatment/Interventions: Self-care/ADL training;Balance training;DME and/or AE instruction;Therapeutic exercise;Neuromuscular education;Manual therapy;Therapeutic activities;Patient/family education    OT Goals(Current goals can be found in the care plan section) Acute Rehab OT Goals Patient Stated Goal: none stated, nodded "yes" for going back to SNF for rehab OT Goal Formulation: With patient Time For Goal Achievement: 08/07/17 Potential to Achieve Goals: Good ADL Goals Pt Will Perform Grooming: with max assist;sitting;with mod assist Pt Will Perform Upper Body Bathing: with max assist;with mod assist;sitting Pt Will Perform Lower Body Bathing: with max assist;with mod assist;sitting/lateral leans Pt Will Perform Upper Body Dressing: with max assist;with mod assist;sitting Pt Will Transfer to Toilet: with mod assist;stand pivot transfer;bedside commode Additional ADL Goal #1: Pt will tolerate PROM to R UE in all planes  OT Frequency: Min 2X/week   Barriers to D/C: Decreased caregiver support          Co-evaluation              AM-PAC PT "6 Clicks" Daily Activity     Outcome Measure Help from another person eating meals?: A Lot Help from another person taking care of personal grooming?: Total Help from another person toileting, which includes using toliet, bedpan, or urinal?: Total Help from another person bathing (including  washing, rinsing, drying)?: Total Help from another person to put on and taking off regular upper body clothing?: Total Help from another person to put on and taking off regular lower body clothing?: Total 6 Click Score: 7   End of Session    Activity Tolerance: Patient limited by fatigue Patient left: in bed;with call bell/phone within reach  OT Visit Diagnosis: Other abnormalities of gait and mobility (R26.89);Muscle weakness (generalized) (M62.81);Unsteadiness on feet (R26.81);Other symptoms and signs involving the nervous system (R29.898);Hemiplegia and hemiparesis Hemiplegia - Right/Left: Right Hemiplegia - dominant/non-dominant: Dominant                Time: 2376-2831 OT Time Calculation (min): 22 min Charges:  OT General Charges $OT Visit: 1 Visit OT Evaluation $OT Eval Moderate Complexity: 1 Mod G-Codes: OT G-codes **NOT FOR INPATIENT CLASS** Functional Assessment Tool Used: AM-PAC 6 Clicks Daily Activity     Britt Bottom 07/24/2017, 2:32 PM

## 2017-07-24 NOTE — Progress Notes (Signed)
Pharmacy Antibiotic Note  Abigail Boyd is a 84 y.o. female admitted on 07/21/2017 with acute somnolence and speec difficultly and was found to have a new acute CVA. Today the patient is more altered and the physician wanted to treat the patient's UTI from 5/25 which showed 50k ESBL K PNA. Discussed other treatment options with MD including oral Fosfomycin however MD wanted to treat with IV Meropenem for now - pharmacy consulted to dose.   Plan: - Meropenem 500 mg IV every 12 hours - F/u transition to po Fosfomycin to complete treatment course - Will continue to follow renal function, culture results, LOT, and antibiotic de-escalation plans    Height: 5\' 4"  (162.6 cm) Weight: 180 lb 8.9 oz (81.9 kg) IBW/kg (Calculated) : 54.7  Temp (24hrs), Avg:98.3 F (36.8 C), Min:97.3 F (36.3 C), Max:98.9 F (37.2 C)  Recent Labs  Lab 07/21/17 1743 07/21/17 1749 07/22/17 1526 07/24/17 0507  WBC 10.1  --  12.3* 10.5  CREATININE 1.21* 1.00 0.99 1.13*    Estimated Creatinine Clearance: 37.7 mL/min (A) (by C-G formula based on SCr of 1.13 mg/dL (H)).    Allergies  Allergen Reactions  . Amlodipine Cough  . Clopidogrel Bisulfate Cough  . Kenalog [Triamcinolone Acetonide] Other (See Comments)    unknown  . Lisinopril Cough  . Losartan Cough  . Sulfa Antibiotics Hives and Rash    Antimicrobials this admission: [facility] 5/28 >>  Microbiology results: 5/25 UCx >> ESBL K PNA (pan-R except S-Imi)  Thank you for allowing pharmacy to be a part of this patient's care.  Alycia Rossetti, PharmD, BCPS Clinical Pharmacist Pager: [phone] Clinical phone for 07/24/2017 from 7a-3:30p: [phone] If after 3:30p, please call main pharmacy at: [phone] 07/24/2017 8:39 AM

## 2017-07-24 NOTE — Evaluation (Signed)
Speech Language Pathology Evaluation Patient Details Name: Abigail Boyd MRN: [id number] DOB: February 09, 1930 Today's Date: 07/24/2017 Time: 0950-1002 SLP Time Calculation (min) (ACUTE ONLY): 12 min  Problem List:  Patient Active Problem List   Diagnosis Date Noted  . Acute CVA (cerebrovascular accident) ([place]) 07/23/2017  . Acute encephalopathy 07/21/2017  . Chronic diastolic CHF (congestive heart failure) ([place]) 07/21/2017  . Generalized weakness 04/23/2017  . Recurrent UTI 04/23/2017  . Dysuria 12/12/2016  . Anxiety 12/13/2015  . Allergic rhinitis 12/13/2015  . Orthostatic hypotension 05/30/2015  . History of cardioembolic cerebrovascular accident (CVA) 05/24/2015  . Urge incontinence 03/15/2015  . Bilateral lower extremity edema 03/15/2015  . Constipation 03/15/2015  . Right knee pain 03/15/2015  . CKD (chronic kidney disease), stage II   . Insulin-requiring or dependent type II diabetes mellitus ([place])   . Osteoporosis screening 06/10/2014  . PLMD (periodic limb movement disorder) 06/12/2013  . Obstructive sleep apnea 03/05/2013  . Tachy-brady syndrome: Rates range from 50-169 bpm in A. fib 12/20/2012  . History of syncope: Unclear etiology 12/19/2012  . Chronotropic incompetence - partially medication related 08/18/2012  . PAF (paroxysmal atrial fibrillation) (HCC);  CHA2DS2-VASc Score =6 on 15 mg Xarelto 05/16/2012    Class: Diagnosis of  . Long term current use of anticoagulant therapy 05/16/2012  . CAD-PCI OM1, 100% RCA (L-R colllaterals) 07/07/2011  . Hypercholesteremia     Class: Diagnosis of  . Essential hypertension     Class: Diagnosis of  . Spinal stenosis     Class: History of  . Diabetes mellitus (HCC)     Class: Diagnosis of  . Benign endometrial hyperplasia 03/21/2011  . Diverticulitis 03/21/2011  . GERD (gastroesophageal reflux disease) 03/21/2011  . Glaucoma (increased eye pressure) 03/21/2011  . Goiter 03/21/2011  . Hemorrhoid 03/21/2011  . History of  anemia 03/21/2011  . History of carpal tunnel syndrome 03/21/2011  . Osteopenia 03/21/2011  . Vitamin D deficiency disease 03/21/2011   Past Medical History:  Past Medical History:  Diagnosis Date  . Abnormal nuclear stress test 06/21/2011   Inferolateral reversible defect;--> cardiac cath & PCI of CxOM, occluded RCA with collaterals;; followup Myoview 11/2012: Low risk. Fixed basal inferior artifact normal EF. No ischemia  . Anxiety    When necessary Xanax  . Anxiety   . Asthma   . Bilateral cataracts    Status post stroke or correction  . CAD S/P percutaneous coronary angioplasty 06/2011   s/P PCI to proximal OM1 w/ DES; occluded RCA with bridging and L-R collaterals (medical management)  . Chronic kidney disease (CKD), stage II (mild)    Related to current bladder infections (although diabetes cannot be excluded)  . Chronotropic incompetence with sinus node dysfunction [facility]) October 2013   On CPET test; beta blockers reduced  . Diabetes mellitus type 2, controlled ([place])    On oral medications  . Diverticulitis   . Essential hypertension    Allowing for permissive hypertension to avoid orthostatic hypotension  . GERD (gastroesophageal reflux disease)    On PPI  . Glaucoma   . History of syncope    Per EP - neurocardiogenic & not Bradycardia related (no PPM)  . History of unstable angina 06/13/2011   Jaw pain awakening from sleep -- Myoview --CATH --> PCI  . Hyperlipidemia with target LDL less than 70    HDL at goal, LDL not at goal, borderline triglycerides. On Crestor 20 mg  . Migraines   . OSA (obstructive sleep apnea)  hx bladder infections  . Osteoarthritis   . PAF (paroxysmal atrial fibrillation) ([place]) 10-11/ 2014   CardioNet Event Monitor: NSR & S Brady -- Rates 50-100; Total A. fib burden 35 hours and 27 minutes. 1296 episodes, longest was 1 hour 29 minutes. Rate ranged from 52-169 beats per minute.  . Seasonal allergies   . Shortness of breath on exertion  October 2013   2-D echo: Normal EF>55%, Gr 1DD, mild aortic sclerosis; Evaluated with CPET - peak VO2 97%; Chronotropic Incompetence (submaximal effort)  . Spinal stenosis of lumbar region 11/2011  . Stroke ([place])   . Tachycardia-bradycardia syndrome ([place]) 11/2012  . Urge incontinence    Past Surgical History:  Past Surgical History:  Procedure Laterality Date  . APPENDECTOMY    . BREAST BIOPSY     both breast  . cataracts     both eyes  . COLONOSCOPY    . CORONARY ANGIOPLASTY WITH STENT PLACEMENT  07/05/2011   Promus Element DES 2.5 mm x 16 mm-post dilated to 2.65 mm CX-Proximal OM1  . Holter Monitor  04/2015   Sinus rhythm with rates 52-149 BPM. Isolated PACs with rare couplets and bigeminy. Multiple short runs of PAT/PSVT. Arrhythmia run 120 bpm for 204 beats. 14% of time was in A. fib/flutter. - Reviewed by Dr. Caryl Comes. Not thought to be significant enough for her syncope.  Marland Kitchen KNEE ARTHROSCOPY WITH MEDIAL MENISECTOMY Right 06/24/2014   Procedure: RIGHT KNEE ARTHROSCOPY WITH partial lateral MENISECTOMY, abrasion chondroplasty of medial femoral condyle and patella, microfracture technique;  Surgeon: Latanya Maudlin, MD;  Location: [facility];  Service: Orthopedics;  Laterality: Right;  . LEFT HEART CATHETERIZATION WITH CORONARY ANGIOGRAM N/A 07/06/2011   Procedure: LEFT HEART CATHETERIZATION WITH CORONARY ANGIOGRAM;  Surgeon: Leonie Man, MD;  Location: [facility] CATH LAB: Unstable Angina --> Myoview wiht Inf-Lat Ischemia.  Proximal OM1 lesion --> PCI; 100% mid RCA with right to right and left to right bridging collaterals from circumflex RPL and LAD the PDA.  Marland Kitchen NM MYOVIEW LTD  October 2014    Low risk. Fixed basal inferior artifact normal EF. No ischemia --> (as compared to pre-PCI Myoview revealing inferolateral ischemia)  . rotator cuff Right   . TRANSTHORACIC ECHOCARDIOGRAM  11/2014   EF 65-70%, Mod LVH. Normal wall motion. Gr 1 DD. Normal valves.  Marland Kitchen VAGINAL HYSTERECTOMY     HPI:  Abigail Boyd  is an 84 y.o. female who has been in short term rehab for reconditioning and IV abx for UTI due to baseline debility post stroke in 2016. Baseline mRS 4, as she needs help with all ADLs, uses a walker or w/c currently per facility. The pt was given Xanex after her therapy on the afternoon of 5/25.  When pts daughter arrived for visit, she was not talking or responding normally and the daughter called EMS. Due to residual rt side weakness and facial droop, EMS called a pre-arrival code stroke. Upon arrival she was unable to answer questions, lethargic and dysarthric speech. She had a right side facial droop and not using right side as briskly. CTH shows sevral old bilateral infarcts. No acute infarcts seen nor LVO. Swallow evaluation ordered.   Assessment / Plan / Recommendation Clinical Impression  Pt presents with dysfluent expressive aphasia c/b severe phonemic paraphasias which leaves her unable to verbally communicate. Pt wa snot able to indicate understanding of communication board and likely has receptive deficits as she was not able to answer simple yes/no questions or follow simple 1  step directions related to self. Additionally, pt has deficits in sustained attention and cognition should be assessed further as language abilities improve. ST to follow acutely for dysphagia and cognitive linguistic abilities.     SLP Assessment  SLP Recommendation/Assessment: Patient needs continued Speech Lanaguage Pathology Services SLP Visit Diagnosis: Aphasia (R47.01);Cognitive communication deficit (R41.841)    Follow Up Recommendations  Skilled Nursing facility    Frequency and Duration min 2x/week  2 weeks      SLP Evaluation Cognition  Overall Cognitive Status: Difficult to assess Arousal/Alertness: Awake/alert Orientation Level: Oriented to person Attention: Sustained Sustained Attention: Impaired Sustained Attention Impairment: Verbal basic;Functional basic Awareness: (she appeared  furstrated that she couldn't verbally communica) Problem Solving: Impaired Problem Solving Impairment: Verbal basic;Functional basic       Comprehension  Auditory Comprehension Overall Auditory Comprehension: Impaired Yes/No Questions: Impaired Basic Biographical Questions: 0-25% accurate Commands: Impaired One Step Basic Commands: 0-24% accurate Visual Recognition/Discrimination Discrimination: Not tested Reading Comprehension Reading Status: Not tested    Expression Expression Primary Mode of Expression: Verbal Verbal Expression Overall Verbal Expression: Impaired Initiation: Impaired Automatic Speech: Name Level of Generative/Spontaneous Verbalization: Word(nonsense words) Repetition: Impaired Level of Impairment: Word level Naming: (unable to attempt) Pragmatics: Impairment Impairments: Eye contact;Abnormal affect Interfering Components: Attention Non-Verbal Means of Communication: Not applicable(unable to utilize communication board) Written Expression Dominant Hand: Right Written Expression: Not tested   Oral / Motor  Oral Motor/Sensory Function Overall Oral Motor/Sensory Function: Moderate impairment Facial ROM: Reduced right;Suspected CN VII (facial) dysfunction Facial Symmetry: Abnormal symmetry right;Suspected CN VII (facial) dysfunction Facial Strength: Reduced right;Suspected CN VII (facial) dysfunction Lingual Symmetry: Abnormal symmetry right;Suspected CN XII (hypoglossal) dysfunction Motor Speech Overall Motor Speech: Impaired Respiration: Within functional limits Resonance: Within functional limits   GO                    Cayson Kalb 07/24/2017, 1:44 PM

## 2017-07-24 NOTE — Progress Notes (Signed)
PROGRESS NOTE  Abigail Boyd CHE:[id number] DOB: 1929-09-26 DOA: 07/21/2017 PCP: Leone Haven, MD   LOS: 1 day   Brief Narrative / Interim history: 84 year old female with prior CVA with residual right-sided weakness, chronic diastolic CHF, paroxysmal A. fib on Xarelto, diabetes, chronic kidney disease stage II was admitted to the hospital on 5/25 with somnolence and speech difficulty from her SNF.  Her MRI on admission was significant for acute CVA.  She was recently admitted and discharged from [facility] for Klebsiella UTI multidrug-resistant.  Apparently in their microbiology was sensitive to ciprofloxacin and that is what she was discharged on however here it appears that it is resistant  Assessment & Plan: Principal Problem:   Acute encephalopathy Active Problems:   Essential hypertension   CAD-PCI OM1, 100% RCA (L-R colllaterals)   PAF (paroxysmal atrial fibrillation) (HCC);  CHA2DS2-VASc Score =6 on 15 mg Xarelto   Obstructive sleep apnea   CKD (chronic kidney disease), stage II   Insulin-requiring or dependent type II diabetes mellitus (HCC)   History of cardioembolic cerebrovascular accident (CVA)   Chronic diastolic CHF (congestive heart failure) (HCC)   Acute CVA (cerebrovascular accident) ([place])   Acute CVA -MRI significant for acute left thalamic lacunar stroke, appreciate neurology consultation, stroke team to see.  Continue Xarelto for A. fib -Lipid panel showed an LDL of 157, start statin -hemoglobin A1c 7.8 -Patient with significant aphasia, speech evaluated and currently on dysphagia 2 diet  Paroxysmal A. fib -Continue metoprolol for rate control, on Xarelto already  Hyperlipidemia -Start statin  Diabetes mellitus -Continue Lantus/sliding scale, continue gabapentin  MDR UTI -Recently hospitalized for UTI, grew Klebsiella which was sensitive to ciprofloxacin, she was sent to SNF on that, cultures here however showed resistance to Cipro -She looks  a little bit more somnolent today, start Carbapenem, will do fosfomycin prior to discharge  Coronary artery disease -No complaints of chest pain, continue beta-blockers, statin  Chronic diastolic CHF -Appears euvolemic  Chronic kidney disease stage II -Creatinine stable   DVT prophylaxis: Xarelto Code Status: DNR Family Communication: no family at bedside Disposition Plan: SNF 1-2 days   Consultants:   Neurology   Procedures:   None  Antimicrobials:  Meropenem 5/28 >>   Subjective: -alert but does not talk. Seen early in am and with significant aphasia.  Reevaluated few hours later and can now tell me her name  Objective: Vitals:   07/23/17 2331 07/24/17 0401 07/24/17 0402 07/24/17 0748  BP: (!) 156/84 (!) 146/74  (!) 159/79  Pulse: (!) 103 95  96  Resp: 20 18  18   Temp: 98.9 F (37.2 C) 98.2 F (36.8 C)  98.2 F (36.8 C)  TempSrc: Oral Oral    SpO2: 94% 94%  97%  Weight:   81.9 kg (180 lb 8.9 oz)   Height:        Intake/Output Summary (Last 24 hours) at 07/24/2017 1136 Last data filed at 07/23/2017 1831 Gross per 24 hour  Intake -  Output 400 ml  Net -400 ml   Filed Weights   07/22/17 0725 07/23/17 0353 07/24/17 0402  Weight: 83.9 kg (185 lb) 81 kg (178 lb 9.2 oz) 81.9 kg (180 lb 8.9 oz)    Examination:  Constitutional: NAD Eyes: lids and conjunctivae normal ENMT: Mucous membranes are moist.  Respiratory: clear to auscultation bilaterally, no wheezing, no crackles. Normal respiratory effort. No accessory muscle use.  Cardiovascular: irregular, no murmurs / rubs / gallops. No LE edema. 2+ pedal pulses.  No carotid bruits.  Abdomen: no tenderness. Bowel sounds positive.  Skin: no rashes Neurologic: aphasic, follows commands, generalized weakness without significant focal deficits   Data Reviewed: I have independently reviewed following labs and imaging studies   CBC: Recent Labs  Lab 07/21/17 1743 07/21/17 1749 07/22/17 1526 07/24/17 0507    WBC 10.1  --  12.3* 10.5  NEUTROABS 7.6  --   --   --   HGB 13.8 14.6 15.0 12.9  HCT 43.3 43.0 46.4* 39.5  MCV 94.5  --  94.5 93.8  PLT 323  --  339 086   Basic Metabolic Panel: Recent Labs  Lab 07/21/17 1743 07/21/17 1749 07/22/17 1526 07/24/17 0507  NA 136 139 138 144  K 4.2 4.2 4.6 4.1  CL 95* 94* 98* 109  CO2 28  --  24 25  GLUCOSE 221* 218* 242* 121*  BUN 18 20 14 18   CREATININE 1.21* 1.00 0.99 1.13*  CALCIUM 9.7  --  9.8 9.4   GFR: Estimated Creatinine Clearance: 37.7 mL/min (A) (by C-G formula based on SCr of 1.13 mg/dL (H)). Liver Function Tests: Recent Labs  Lab 07/21/17 1743  AST 23  ALT 25  ALKPHOS 85  BILITOT 0.4  PROT 7.6  ALBUMIN 3.6   No results for input(s): LIPASE, AMYLASE in the last 168 hours. Recent Labs  Lab 07/22/17 0026  AMMONIA 36*   Coagulation Profile: Recent Labs  Lab 07/21/17 1743  INR 1.13   Cardiac Enzymes: No results for input(s): CKTOTAL, CKMB, CKMBINDEX, TROPONINI in the last 168 hours. BNP (last 3 results) No results for input(s): PROBNP in the last 8760 hours. HbA1C: Recent Labs    07/22/17 1526  HGBA1C 7.8*   CBG: Recent Labs  Lab 07/23/17 1624 07/23/17 1950 07/23/17 2330 07/24/17 0358 07/24/17 0748  GLUCAP 172* 290* 186* 125* 118*   Lipid Profile: Recent Labs    07/24/17 0507  CHOL 234*  HDL 33*  LDLCALC 157*  TRIG 218*  CHOLHDL 7.1   Thyroid Function Tests: Recent Labs    07/22/17 0026  TSH 4.194   Anemia Panel: Recent Labs    07/22/17 0026  VITAMINB12 342   Urine analysis:    Component Value Date/Time   COLORURINE STRAW (A) 07/21/2017 2232   APPEARANCEUR CLEAR 07/21/2017 2232   APPEARANCEUR Cloudy (A) 06/04/2017 1507   LABSPEC 1.017 07/21/2017 2232   PHURINE 8.0 07/21/2017 2232   GLUCOSEU NEGATIVE 07/21/2017 2232   GLUCOSEU NEGATIVE 08/19/2015 1630   HGBUR NEGATIVE 07/21/2017 2232   BILIRUBINUR NEGATIVE 07/21/2017 2232   BILIRUBINUR Negative 06/04/2017 [place] 07/21/2017 2232   PROTEINUR NEGATIVE 07/21/2017 2232   UROBILINOGEN 0.2 04/12/2017 1355   UROBILINOGEN 0.2 08/19/2015 1630   NITRITE NEGATIVE 07/21/2017 2232   LEUKOCYTESUR TRACE (A) 07/21/2017 2232   LEUKOCYTESUR 3+ (A) 06/04/2017 1507   Sepsis Labs: Invalid input(s): PROCALCITONIN, LACTICIDVEN  Recent Results (from the past 240 hour(s))  Urine culture     Status: Abnormal   Collection Time: 07/21/17 10:37 PM  Result Value Ref Range Status   Specimen Description URINE, CLEAN CATCH  Final   Special Requests NONE  Final   Culture (A)  Final    50,000 COLONIES/mL KLEBSIELLA PNEUMONIAE Confirmed Extended Spectrum Beta-Lactamase Producer (ESBL).  In bloodstream infections from ESBL organisms, carbapenems are preferred over piperacillin/tazobactam. They are shown to have a lower risk of mortality. Performed at [facility], [place] [address] [place], [place] [address]  Report Status 07/24/2017 FINAL  Final   Organism ID, Bacteria KLEBSIELLA PNEUMONIAE (A)  Final      Susceptibility   Klebsiella pneumoniae - MIC*    AMPICILLIN >=32 RESISTANT Resistant     CEFAZOLIN >=64 RESISTANT Resistant     CEFTRIAXONE >=64 RESISTANT Resistant     CIPROFLOXACIN >=4 RESISTANT Resistant     GENTAMICIN >=16 RESISTANT Resistant     IMIPENEM <=0.25 SENSITIVE Sensitive     NITROFURANTOIN 256 RESISTANT Resistant     TRIMETH/SULFA >=320 RESISTANT Resistant     AMPICILLIN/SULBACTAM >=32 RESISTANT Resistant     PIP/TAZO >=128 RESISTANT Resistant     Extended ESBL POSITIVE Resistant     * 50,000 COLONIES/mL KLEBSIELLA PNEUMONIAE      Radiology Studies: Mr Brain 56 Contrast  Result Date: 07/22/2017 CLINICAL DATA:  Acute somnolence and speech difficulty. Chronic RIGHT-sided motor deficits. EXAM: MRI HEAD WITHOUT CONTRAST TECHNIQUE: Multiplanar, multiecho pulse sequences of the brain and surrounding structures were obtained without intravenous contrast. COMPARISON:  CT head and CTA head neck  07/21/2017. FINDINGS: Brain: Restricted diffusion affects the LEFT lentiform nucleus, and periventricular white matter, consistent with an acute LEFT MCA lenticulostriate infarct. No hemorrhage, mass lesion, or extra-axial fluid. Hydrocephalus ex vacuo. Atrophy with small vessel disease. Chronic lacunar infarcts affect the BILATERAL thalamus and BILATERAL basal ganglia. Vascular: Flow voids are maintained. Skull and upper cervical spine: Normal marrow signal. Pannus surrounds C1-C2. Sinuses/Orbits: No layering sinus fluid.  Negative orbits. Other: None. IMPRESSION: Acute LEFT MCA lenticulostriate territory infarct, nonhemorrhagic. Involvement of the LEFT lentiform nucleus and periventricular white matter. Chronic changes as described. Electronically Signed   By: Staci Righter M.D.   On: 07/22/2017 19:30   Dg Swallowing Func-speech Pathology  Result Date: 07/23/2017 Objective Swallowing Evaluation: Type of Study: MBS-Modified Barium Swallow Study  Patient Details Name: ERISHA PAUGH MRN: [id number] Date of Birth: 11-Nov-1929 Today's Date: 07/23/2017 Time: SLP Start Time (ACUTE ONLY): 0935 -SLP Stop Time (ACUTE ONLY): 1004 SLP Time Calculation (min) (ACUTE ONLY): 29 min Past Medical History: Past Medical History: Diagnosis Date . Abnormal nuclear stress test 06/21/2011  Inferolateral reversible defect;--> cardiac cath & PCI of CxOM, occluded RCA with collaterals;; followup Myoview 11/2012: Low risk. Fixed basal inferior artifact normal EF. No ischemia . Anxiety   When necessary Xanax . Anxiety  . Asthma  . Bilateral cataracts   Status post stroke or correction . CAD S/P percutaneous coronary angioplasty 06/2011  s/P PCI to proximal OM1 w/ DES; occluded RCA with bridging and L-R collaterals (medical management) . Chronic kidney disease (CKD), stage II (mild)   Related to current bladder infections (although diabetes cannot be excluded) . Chronotropic incompetence with sinus node dysfunction [facility]) October 2013  On CPET  test; beta blockers reduced . Diabetes mellitus type 2, controlled ([place])   On oral medications . Diverticulitis  . Essential hypertension   Allowing for permissive hypertension to avoid orthostatic hypotension . GERD (gastroesophageal reflux disease)   On PPI . Glaucoma  . History of syncope   Per EP - neurocardiogenic & not Bradycardia related (no PPM) . History of unstable angina 06/13/2011  Jaw pain awakening from sleep -- Myoview --CATH --> PCI . Hyperlipidemia with target LDL less than 70   HDL at goal, LDL not at goal, borderline triglycerides. On Crestor 20 mg . Migraines  . OSA (obstructive sleep apnea)   hx bladder infections . Osteoarthritis  . PAF (paroxysmal atrial fibrillation) ([place]) 10-11/ 2014  CardioNet Event Monitor: NSR &  [place] -- Rates 50-100; Total A. fib burden 35 hours and 27 minutes. 1296 episodes, longest was 1 hour 29 minutes. Rate ranged from 52-169 beats per minute. . Seasonal allergies  . Shortness of breath on exertion October 2013  2-D echo: Normal EF>55%, Gr 1DD, mild aortic sclerosis; Evaluated with CPET - peak VO2 97%; Chronotropic Incompetence (submaximal effort) . Spinal stenosis of lumbar region 11/2011 . Stroke ([place])  . Tachycardia-bradycardia syndrome ([place]) 11/2012 . Urge incontinence  Past Surgical History: Past Surgical History: Procedure Laterality Date . APPENDECTOMY   . BREAST BIOPSY    both breast . cataracts    both eyes . COLONOSCOPY   . CORONARY ANGIOPLASTY WITH STENT PLACEMENT  07/05/2011  Promus Element DES 2.5 mm x 16 mm-post dilated to 2.65 mm CX-Proximal OM1 . Holter Monitor  04/2015  Sinus rhythm with rates 52-149 BPM. Isolated PACs with rare couplets and bigeminy. Multiple short runs of PAT/PSVT. Arrhythmia run 120 bpm for 204 beats. 14% of time was in A. fib/flutter. - Reviewed by Dr. Caryl Comes. Not thought to be significant enough for her syncope. Marland Kitchen KNEE ARTHROSCOPY WITH MEDIAL MENISECTOMY Right 06/24/2014  Procedure: RIGHT KNEE ARTHROSCOPY WITH partial lateral  MENISECTOMY, abrasion chondroplasty of medial femoral condyle and patella, microfracture technique;  Surgeon: Latanya Maudlin, MD;  Location: [facility];  Service: Orthopedics;  Laterality: Right; . LEFT HEART CATHETERIZATION WITH CORONARY ANGIOGRAM N/A 07/06/2011  Procedure: LEFT HEART CATHETERIZATION WITH CORONARY ANGIOGRAM;  Surgeon: Leonie Man, MD;  Location: [facility] CATH LAB: Unstable Angina --> Myoview wiht Inf-Lat Ischemia.  Proximal OM1 lesion --> PCI; 100% mid RCA with right to right and left to right bridging collaterals from circumflex RPL and LAD the PDA. Marland Kitchen NM MYOVIEW LTD  October 2014   Low risk. Fixed basal inferior artifact normal EF. No ischemia --> (as compared to pre-PCI Myoview revealing inferolateral ischemia) . rotator cuff Right  . TRANSTHORACIC ECHOCARDIOGRAM  11/2014  EF 65-70%, Mod LVH. Normal wall motion. Gr 1 DD. Normal valves. Marland Kitchen VAGINAL HYSTERECTOMY   HPI: Abigail Boyd is an 84 y.o. female who has been in short term rehab for reconditioning and IV abx for UTI due to baseline debility post stroke in 2016. Baseline mRS 4, as she needs help with all ADLs, uses a walker or w/c currently per facility. The pt was given Xanex after her therapy on the afternoon of 5/25.  When pts daughter arrived for visit, she was not talking or responding normally and the daughter called EMS. Due to residual rt side weakness and facial droop, EMS called a pre-arrival code stroke. Upon arrival she was unable to answer questions, lethargic and dysarthric speech. She had a right side facial droop and not using right side as briskly. CTH shows sevral old bilateral infarcts. No acute infarcts seen nor LVO. Swallow evaluation ordered.  Subjective: MRI showed L MCA infarct Assessment / Plan / Recommendation CHL IP CLINICAL IMPRESSIONS 07/23/2017 Clinical Impression Patient presents with moderate oropharyngeal dysphagia due to sensory and motor deficits; there is sensed aspiration of thin liquids and straw sips of nectar. Oral  stage characterized by impaired mastication, decreased bolus cohesion, delayed oral transit. There is mild-moderate lingual residue with solids, as well as mild oral holding with liquids, right anterior spillage with thin liquids due to decreased labial seal. Swallow initiation delayed to valleculae for most consistencies, but pyriform sinuses with thin liquids and straw sips of nectar. There is decreased/sluggish hyolarygneal excursion with delayed closure of laryngeal vestibule leading to  penetration and aspiration before the swallow thin and straw sips of nectar. Pt did elicit a cough response which cleared remaining penetration but not aspiration. Chin tuck did not prevent deep penetration/questionable trace aspiration of thin liquids. No penetration/aspiration with cup sips of nectar. No abnormal residue remained in the pharynx after the swallow, with the exception of barium tablet which was retained in the valleculae and cleared with reflexive subsequent swallow. Cervical esophageal phase was unremarkable. Limited esophageal sweep performed due to positioning constraints. Recommend initiating dys 2, nectar thick liquids, NO STRAWS, meds whole with nectar thick liquid. Pt would benefit from interventions to address strengthening of hyolaryngeal complex, cough. Will continue to follow. MD, please order SLP cognitive-linguistic evaluation.  SLP Visit Diagnosis Dysphagia, oropharyngeal phase (R13.12) Attention and concentration deficit following -- Frontal lobe and executive function deficit following -- Impact on safety and function Mild aspiration risk;Moderate aspiration risk   CHL IP TREATMENT RECOMMENDATION 07/23/2017 Treatment Recommendations Therapy as outlined in treatment plan below;F/U MBS in --- days (Comment)   Prognosis 07/23/2017 Prognosis for Safe Diet Advancement Good Barriers to Reach Goals Language deficits Barriers/Prognosis Comment -- CHL IP DIET RECOMMENDATION 07/23/2017 SLP Diet Recommendations  Dysphagia 2 (Fine chop) solids;Nectar thick liquid Liquid Administration via Cup;No straw Medication Administration Whole meds with liquid Compensations Slow rate;Small sips/bites Postural Changes Seated upright at 90 degrees   CHL IP OTHER RECOMMENDATIONS 07/23/2017 Recommended Consults Other (Comment) Oral Care Recommendations Oral care BID Other Recommendations Remove water pitcher;Have oral suction available   CHL IP FOLLOW UP RECOMMENDATIONS 07/23/2017 Follow up Recommendations Other (comment)   CHL IP FREQUENCY AND DURATION 07/23/2017 Speech Therapy Frequency (ACUTE ONLY) min 2x/week Treatment Duration 2 weeks      CHL IP ORAL PHASE 07/23/2017 Oral Phase Impaired Oral - Pudding Teaspoon -- Oral - Pudding Cup -- Oral - Honey Teaspoon WFL Oral - Honey Cup -- Oral - Nectar Teaspoon Holding of bolus Oral - Nectar Cup Holding of bolus Oral - Nectar Straw Holding of bolus Oral - Thin Teaspoon Right anterior bolus loss;Holding of bolus;Decreased bolus cohesion Oral - Thin Cup Right anterior bolus loss;Decreased bolus cohesion Oral - Thin Straw -- Oral - Puree Decreased bolus cohesion;Delayed oral transit Oral - Mech Soft -- Oral - Regular Impaired mastication;Reduced posterior propulsion;Delayed oral transit;Decreased bolus cohesion Oral - Multi-Consistency -- Oral - Pill Delayed oral transit;Decreased bolus cohesion Oral Phase - Comment --  CHL IP PHARYNGEAL PHASE 07/23/2017 Pharyngeal Phase Impaired Pharyngeal- Pudding Teaspoon -- Pharyngeal -- Pharyngeal- Pudding Cup -- Pharyngeal -- Pharyngeal- Honey Teaspoon Delayed swallow initiation-vallecula;Reduced anterior laryngeal mobility;Reduced laryngeal elevation Pharyngeal -- Pharyngeal- Honey Cup -- Pharyngeal -- Pharyngeal- Nectar Teaspoon Delayed swallow initiation-vallecula;Reduced anterior laryngeal mobility;Reduced laryngeal elevation Pharyngeal -- Pharyngeal- Nectar Cup Delayed swallow initiation-vallecula;Reduced laryngeal elevation;Reduced anterior laryngeal  mobility Pharyngeal -- Pharyngeal- Nectar Straw Delayed swallow initiation-pyriform sinuses;Reduced anterior laryngeal mobility;Reduced laryngeal elevation;Reduced airway/laryngeal closure;Penetration/Aspiration before swallow;Trace aspiration Pharyngeal Material enters airway, passes BELOW cords and not ejected out despite cough attempt by patient Pharyngeal- Thin Teaspoon Delayed swallow initiation-vallecula;Reduced laryngeal elevation;Reduced anterior laryngeal mobility Pharyngeal -- Pharyngeal- Thin Cup Delayed swallow initiation-pyriform sinuses;Reduced anterior laryngeal mobility;Reduced laryngeal elevation;Reduced airway/laryngeal closure;Penetration/Aspiration before swallow;Moderate aspiration Pharyngeal Material enters airway, passes BELOW cords and not ejected out despite cough attempt by patient Pharyngeal- Thin Straw -- Pharyngeal -- Pharyngeal- Puree Delayed swallow initiation-vallecula;Reduced epiglottic inversion;Reduced anterior laryngeal mobility Pharyngeal -- Pharyngeal- Mechanical Soft -- Pharyngeal -- Pharyngeal- Regular Delayed swallow initiation-vallecula;Reduced anterior laryngeal mobility;Reduced laryngeal elevation Pharyngeal -- Pharyngeal- Multi-consistency -- Pharyngeal -- Pharyngeal- Pill  Delayed swallow initiation-vallecula;Pharyngeal residue - valleculae;Reduced anterior laryngeal mobility;Reduced laryngeal elevation Pharyngeal -- Pharyngeal Comment --  CHL IP CERVICAL ESOPHAGEAL PHASE 07/23/2017 Cervical Esophageal Phase WFL Pudding Teaspoon -- Pudding Cup -- Honey Teaspoon -- Honey Cup -- Nectar Teaspoon -- Nectar Cup -- Nectar Straw -- Thin Teaspoon -- Thin Cup -- Thin Straw -- Puree -- Mechanical Soft -- Regular -- Multi-consistency -- Pill -- Cervical Esophageal Comment -- No flowsheet data found. Aliene Altes 07/23/2017, 11:08 AM  Deneise Lever, MS, CCC-SLP Speech-Language Pathologist [phone]               Scheduled Meds: . docusate sodium  100 mg Oral QHS  . FLUoxetine   20 mg Oral Daily  . gabapentin  300 mg Oral TID  . insulin aspart  0-15 Units Subcutaneous Q4H  . insulin glargine  50 Units Subcutaneous QHS  . latanoprost  1 drop Both Eyes QHS  . magnesium oxide  400 mg Oral QHS  . metoprolol tartrate  25 mg Oral BID  . mirabegron ER  50 mg Oral Daily  . omega-3 acid ethyl esters  1 g Oral Daily  . pantoprazole  40 mg Oral Daily  . rivaroxaban  15 mg Oral QPM  . rOPINIRole  0.5 mg Oral QHS  . sodium chloride flush  3 mL Intravenous Q12H  . sodium chloride flush  3 mL Intravenous Q12H  . timolol  1 drop Both Eyes Daily   Continuous Infusions: . sodium chloride    . meropenem (MERREM) IV 500 mg (07/24/17 1126)     Marzetta Board, MD, PhD [name] Pager [phone]  If 7PM-7AM, please contact night-coverage [email or web address] Password [facility] 07/24/2017, 11:36 AM

## 2017-07-24 NOTE — Progress Notes (Signed)
Patient refuses CPAP for the night. RT will continue to monitor.  

## 2017-07-25 ENCOUNTER — Encounter
Admission: RE | Admit: 2017-07-25 | Discharge: 2017-07-25 | Disposition: A | Discharge status: Home / Self Care | Payer: Medicare Other | Source: Ambulatory Visit | Attending: Internal Medicine | Admitting: Internal Medicine

## 2017-07-25 DIAGNOSIS — M6281 Muscle weakness (generalized): Secondary | ICD-10-CM | POA: Diagnosis not present

## 2017-07-25 DIAGNOSIS — N182 Chronic kidney disease, stage 2 (mild): Secondary | ICD-10-CM | POA: Diagnosis not present

## 2017-07-25 DIAGNOSIS — H409 Unspecified glaucoma: Secondary | ICD-10-CM | POA: Diagnosis not present

## 2017-07-25 DIAGNOSIS — Z7401 Bed confinement status: Secondary | ICD-10-CM | POA: Diagnosis not present

## 2017-07-25 DIAGNOSIS — M25561 Pain in right knee: Secondary | ICD-10-CM | POA: Diagnosis not present

## 2017-07-25 DIAGNOSIS — G4761 Periodic limb movement disorder: Secondary | ICD-10-CM | POA: Diagnosis not present

## 2017-07-25 DIAGNOSIS — I480 Paroxysmal atrial fibrillation: Secondary | ICD-10-CM | POA: Diagnosis not present

## 2017-07-25 DIAGNOSIS — I69391 Dysphagia following cerebral infarction: Secondary | ICD-10-CM | POA: Diagnosis not present

## 2017-07-25 DIAGNOSIS — N3281 Overactive bladder: Secondary | ICD-10-CM | POA: Diagnosis not present

## 2017-07-25 DIAGNOSIS — M79671 Pain in right foot: Secondary | ICD-10-CM | POA: Diagnosis not present

## 2017-07-25 DIAGNOSIS — J302 Other seasonal allergic rhinitis: Secondary | ICD-10-CM | POA: Diagnosis not present

## 2017-07-25 DIAGNOSIS — I2510 Atherosclerotic heart disease of native coronary artery without angina pectoris: Secondary | ICD-10-CM | POA: Diagnosis not present

## 2017-07-25 DIAGNOSIS — M48061 Spinal stenosis, lumbar region without neurogenic claudication: Secondary | ICD-10-CM | POA: Diagnosis not present

## 2017-07-25 DIAGNOSIS — R531 Weakness: Secondary | ICD-10-CM | POA: Diagnosis not present

## 2017-07-25 DIAGNOSIS — R1312 Dysphagia, oropharyngeal phase: Secondary | ICD-10-CM | POA: Diagnosis not present

## 2017-07-25 DIAGNOSIS — Z794 Long term (current) use of insulin: Secondary | ICD-10-CM | POA: Diagnosis not present

## 2017-07-25 DIAGNOSIS — I5032 Chronic diastolic (congestive) heart failure: Secondary | ICD-10-CM | POA: Diagnosis not present

## 2017-07-25 DIAGNOSIS — I639 Cerebral infarction, unspecified: Secondary | ICD-10-CM | POA: Diagnosis not present

## 2017-07-25 DIAGNOSIS — R262 Difficulty in walking, not elsewhere classified: Secondary | ICD-10-CM | POA: Diagnosis not present

## 2017-07-25 DIAGNOSIS — M25551 Pain in right hip: Secondary | ICD-10-CM | POA: Diagnosis not present

## 2017-07-25 DIAGNOSIS — E785 Hyperlipidemia, unspecified: Secondary | ICD-10-CM | POA: Diagnosis not present

## 2017-07-25 DIAGNOSIS — I4891 Unspecified atrial fibrillation: Secondary | ICD-10-CM | POA: Diagnosis not present

## 2017-07-25 DIAGNOSIS — G4733 Obstructive sleep apnea (adult) (pediatric): Secondary | ICD-10-CM | POA: Diagnosis not present

## 2017-07-25 DIAGNOSIS — M79651 Pain in right thigh: Secondary | ICD-10-CM | POA: Diagnosis not present

## 2017-07-25 DIAGNOSIS — R4701 Aphasia: Secondary | ICD-10-CM | POA: Diagnosis not present

## 2017-07-25 DIAGNOSIS — I130 Hypertensive heart and chronic kidney disease with heart failure and stage 1 through stage 4 chronic kidney disease, or unspecified chronic kidney disease: Secondary | ICD-10-CM | POA: Diagnosis not present

## 2017-07-25 DIAGNOSIS — M2550 Pain in unspecified joint: Secondary | ICD-10-CM | POA: Diagnosis not present

## 2017-07-25 DIAGNOSIS — I69320 Aphasia following cerebral infarction: Secondary | ICD-10-CM | POA: Diagnosis not present

## 2017-07-25 DIAGNOSIS — K219 Gastro-esophageal reflux disease without esophagitis: Secondary | ICD-10-CM | POA: Diagnosis not present

## 2017-07-25 DIAGNOSIS — I69351 Hemiplegia and hemiparesis following cerebral infarction affecting right dominant side: Secondary | ICD-10-CM | POA: Diagnosis not present

## 2017-07-25 DIAGNOSIS — E1122 Type 2 diabetes mellitus with diabetic chronic kidney disease: Secondary | ICD-10-CM | POA: Diagnosis not present

## 2017-07-25 DIAGNOSIS — Z7901 Long term (current) use of anticoagulants: Secondary | ICD-10-CM | POA: Diagnosis not present

## 2017-07-25 DIAGNOSIS — G9340 Encephalopathy, unspecified: Secondary | ICD-10-CM | POA: Diagnosis not present

## 2017-07-25 DIAGNOSIS — M25571 Pain in right ankle and joints of right foot: Secondary | ICD-10-CM | POA: Diagnosis not present

## 2017-07-25 DIAGNOSIS — N183 Chronic kidney disease, stage 3 (moderate): Secondary | ICD-10-CM | POA: Diagnosis not present

## 2017-07-25 LAB — GLUCOSE, CAPILLARY
Glucose-Capillary: 289 mg/dL — ABNORMAL HIGH (ref 65–99)
Glucose-Capillary: 140 mg/dL — ABNORMAL HIGH (ref 65–99)
Glucose-Capillary: 172 mg/dL — ABNORMAL HIGH (ref 65–99)
Glucose-Capillary: 225 mg/dL — ABNORMAL HIGH (ref 65–99)

## 2017-07-25 LAB — RPR

## 2017-07-25 MED ORDER — ATORVASTATIN CALCIUM 80 MG PO TABS: 80.0000 mg | ORAL_TABLET | Freq: Every day | ORAL | Status: DC

## 2017-07-25 MED ORDER — TRAMADOL HCL 50 MG PO TABS: 50.0000 mg | ORAL_TABLET | Freq: Two times a day (BID) | ORAL | 0 refills | Status: DC

## 2017-07-25 MED ORDER — FOSFOMYCIN TROMETHAMINE 3 G PO PACK
3.0000 g | PACK | Freq: Once | ORAL | Status: AC
  Administered 2017-07-25: 3 g via ORAL
  Filled 2017-07-25: qty 3

## 2017-07-25 MED ORDER — ALPRAZOLAM 0.25 MG PO TABS: ORAL_TABLET | ORAL | 0 refills | Status: DC

## 2017-07-25 MED ORDER — PAROXETINE HCL 20 MG PO TABS: 20.0000 mg | ORAL_TABLET | Freq: Every day | ORAL | 2 refills | Status: DC

## 2017-07-25 MED ORDER — FLUOXETINE HCL 20 MG PO CAPS: 20.0000 mg | ORAL_CAPSULE | Freq: Every day | ORAL | 3 refills | Status: DC

## 2017-07-25 NOTE — Progress Notes (Signed)
Patient Daughter had question regarding discharge order for Prozac, stated pt was on Paxil at home. Notified MD, order changed to Paxil, this nurse notified the SNF of updated order.

## 2017-07-25 NOTE — Progress Notes (Signed)
Physical Therapy Treatment Patient Details Name: Abigail Boyd MRN: [id number] DOB: August 06, 1929 Today's Date: 07/25/2017    History of Present Illness Pt is an 84 y/o female presenting with new onset speech difficulties. MRI revealed L MCA lenticulostriate infarct. PMH includes HTN, a fib, CKD 2, DM, dCHF, and CVA.     PT Comments    Patient is making progress toward mobility goals and able to ambulate 10ft with mod A +2 and RW. Continue to progress as tolerated with anticipated d/c to SNF for further skilled PT services.     Follow Up Recommendations  SNF     Equipment Recommendations  None recommended by PT    Recommendations for Other Services       Precautions / Restrictions Precautions Precautions: Fall Restrictions Weight Bearing Restrictions: No    Mobility  Bed Mobility Overal bed mobility: Needs Assistance Bed Mobility: Supine to Sit     Supine to sit: Mod assist;HOB elevated     General bed mobility comments: cues for sequencing and for use of bed rail; assist to bring R LE to EOB and elevate trunk into sitting  Transfers Overall transfer level: Needs assistance Equipment used: None Transfers: Sit to/from Stand Sit to Stand: Mod assist;+2 physical assistance         General transfer comment: cues for safe hand placement; assist to power up into standing and for upright posture   Ambulation/Gait Ambulation/Gait assistance: Mod assist;+2 safety/equipment Ambulation Distance (Feet): 5 Feet Assistive device: Rolling walker (2 wheeled) Gait Pattern/deviations: Step-to pattern;Decreased step length - right;Decreased step length - left;Trunk flexed;Wide base of support     General Gait Details: hand over hand assist for R hand on RW; mutlimodal cues for weight shifting and posture and vc for increased R step length; assistance for balance and managing RW    Stairs             Wheelchair Mobility    Modified Rankin (Stroke Patients Only)        Balance Overall balance assessment: Needs assistance Sitting-balance support: No upper extremity supported;Feet supported Sitting balance-Leahy Scale: Fair     Standing balance support: Bilateral upper extremity supported;During functional activity Standing balance-Leahy Scale: Poor                              Cognition Arousal/Alertness: Awake/alert Behavior During Therapy: Flat affect(pt emotional during session and crying at times) Overall Cognitive Status: Impaired/Different from baseline Area of Impairment: Attention;Following commands;Problem solving;Awareness                   Current Attention Level: Sustained   Following Commands: Follows one step commands with increased time   Awareness: Intellectual Problem Solving: Slow processing;Decreased initiation;Difficulty sequencing;Requires verbal cues General Comments: pt answering most questions appropriately however unable to state year and diffculty with word finding      Exercises      General Comments        Pertinent Vitals/Pain Pain Assessment: Faces Faces Pain Scale: Hurts little more Pain Location: R knee with mobility Pain Descriptors / Indicators: Grimacing Pain Intervention(s): Limited activity within patient's tolerance;Monitored during session;Repositioned    Home Living                      Prior Function            PT Goals (current goals can now be found in the care plan section)  Acute Rehab PT Goals PT Goal Formulation: With family Time For Goal Achievement: 08/06/17 Potential to Achieve Goals: Fair Progress towards PT goals: Progressing toward goals    Frequency    Min 3X/week      PT Plan Current plan remains appropriate    Co-evaluation              AM-PAC PT "6 Clicks" Daily Activity  Outcome Measure  Difficulty turning over in bed (including adjusting bedclothes, sheets and blankets)?: Unable Difficulty moving from lying on back to  sitting on the side of the bed? : Unable Difficulty sitting down on and standing up from a chair with arms (e.g., wheelchair, bedside commode, etc,.)?: Unable Help needed moving to and from a bed to chair (including a wheelchair)?: A Lot Help needed walking in hospital room?: A Lot Help needed climbing 3-5 steps with a railing? : Total 6 Click Score: 8    End of Session Equipment Utilized During Treatment: Gait belt Activity Tolerance: Patient tolerated treatment well Patient left: with call bell/phone within reach;in chair;with chair alarm set Nurse Communication: Mobility status PT Visit Diagnosis: Hemiplegia and hemiparesis;Difficulty in walking, not elsewhere classified (R26.2);Muscle weakness (generalized) (M62.81);Unsteadiness on feet (R26.81);Pain Hemiplegia - Right/Left: Right Hemiplegia - dominant/non-dominant: Dominant Hemiplegia - caused by: Cerebral infarction Pain - Right/Left: Right Pain - part of body: Knee     Time: 9983-3825 PT Time Calculation (min) (ACUTE ONLY): 28 min  Charges:  $Gait Training: 8-22 mins $Therapeutic Activity: 8-22 mins                    G Codes:       Earney Navy, PTA Pager: [phone]     Darliss Cheney 07/25/2017, 12:58 PM

## 2017-07-25 NOTE — Discharge Instructions (Signed)

## 2017-07-25 NOTE — Discharge Summary (Addendum)
Physician Discharge Summary  Abigail Boyd YQM:[id number] DOB: [birth year] DOA: 07/21/2017  PCP: Leone Haven, MD  Admit date: 07/21/2017 Discharge date: 07/25/2017  Admitted From: SNF Disposition:  SNF  Recommendations for Outpatient Follow-up:  1. Follow up with PCP in 1-2 weeks  Home Health: none Equipment/Devices: none  Discharge Condition: stable CODE STATUS: DNR Diet recommendation: dysphagia 2 with nectar thick   HPI: Per Dr. Karel Jarvis is a 84 y.o. female with medical history significant for history of CVA with residual right-sided motor deficits, chronic diastolic CHF, paroxysmal atrial fibrillation on Xarelto, insulin-dependent diabetes mellitus, and chronic kidney disease stage II, now presenting with acute somnolence and speech difficulty from her SNF where she is rehabilitating after admission for UTI and generalized weakness.  Patient had reportedly been in her usual state, participated in physical therapy, began to complain of right leg pain and was tearful and anxious, treated with Xanax at her nursing facility.  She was then found to be somnolent, difficult to awake, and with apparent difficulty speaking.  A right facial droop and right arm weakness was noted and she was brought into the ED as a code stroke. ED Course: Upon arrival to the ED, patient is found to be afebrile, saturating well on room air, and with vitals otherwise stable.  EKG features a sinus rhythm and radiographs of the right leg are negative for acute findings.  Noncontrast head CT is negative for acute intracranial abnormality, but notable for stable remote infarcts.  Chemistry panel features a creatinine 1.21, up from 0.95 earlier this month.  CBC is unremarkable and troponin is undetectable.  Urinalysis appears normal.  Neurology evaluated the patient in the emergency department and notes that the right-sided deficits are from her remote strokes and suspects her current presentation,  encephalopathy, is secondary to benzodiazepine.  Medical admission for further observation was recommended.   Hospital Course: Acute CVA -MRI significant for acute left thalamic lacunar stroke, neurology consulted and followed patient while hospitalized. Continue Xarelto for A. Fib. Lipid panel showed an LDL of 157, start statin. Hemoglobin A1c 7.8. Patient with significant aphasia, speech evaluated and currently on dysphagia 2 diet. 2D echo with normal EF as below. CT angio head and neck without significant occlusions. She started to recover, initially completely aphasic but starting to talk more, can answer basic questions with 1-2 words.  Paroxysmal A. Fib -Continue metoprolol for rate control, on Xarelto already Hyperlipidemia -Start statin Diabetes mellitus -Continue Lantus/sliding scale, continue gabapentin MDR UTI -Recently hospitalized for UTI, grew Klebsiella which was sensitive to ciprofloxacin, she was sent to SNF on that, cultures here however showed resistance to Cipro. Treated with Meropenem here and followed by a dose of fosfomycin prior to discharge. Suspect chronic colonization at play Coronary artery disease -No complaints of chest pain, continue beta-blockers, statin Chronic diastolic CHF -Appears euvolemic Chronic kidney disease stage II -Creatinine stable  Discharge Diagnoses:  Principal Problem:   Acute encephalopathy Active Problems:   Essential hypertension   CAD-PCI OM1, 100% RCA (L-R colllaterals)   PAF (paroxysmal atrial fibrillation) (HCC);  CHA2DS2-VASc Score =6 on 15 mg Xarelto   Obstructive sleep apnea   CKD (chronic kidney disease), stage II   Insulin-requiring or dependent type II diabetes mellitus ([place])   History of cardioembolic cerebrovascular accident (CVA)   Chronic diastolic CHF (congestive heart failure) ([place])   Acute CVA (cerebrovascular accident) [facility])  Discharge Instructions  Discharge Instructions    Ambulatory referral to Neurology   Complete  by:  As directed    Follow up with stroke clinic NP (Jessica Vanschaick or Cecille Rubin, if both not available, consider Dr. Antony Contras, Dr. Bess Harvest, or Dr. Sarina Ill) at [facility] in about 4 weeks.     Allergies as of 07/25/2017      Reactions   Amlodipine Cough   Clopidogrel Bisulfate Cough   Kenalog [triamcinolone Acetonide] Other (See Comments)   unknown   Lisinopril Cough   Losartan Cough   Sulfa Antibiotics Hives, Rash      Medication List    TAKE these medications   ALPRAZolam 0.25 MG tablet Commonly known as:  XANAX TAKE 1 TABLET BY MOUTH AT BEDTIME AS NEEDED SLEEP   atorvastatin 80 MG tablet Commonly known as:  LIPITOR Take 1 tablet (80 mg total) by mouth daily at 6 PM.   azelastine 0.1 % nasal spray Commonly known as:  ASTELIN Place 1-2 sprays into both nostrils at bedtime. Use in each nostril as directed   estradiol 0.1 MG/GM vaginal cream Commonly known as:  ESTRACE VAGINAL Apply 0.5mg  (pea-sized amount)  just inside the vaginal introitus with a finger-tip on Monday, Wednesday and Friday nights.   fish oil-omega-3 fatty acids 1000 MG capsule Take 1 g by mouth daily.   furosemide 20 MG tablet Commonly known as:  LASIX Take 2 tablets (40 mg total) by mouth daily.   gabapentin 300 MG capsule Commonly known as:  NEURONTIN Take 300 mg by mouth 3 (three) times daily.   HUMALOG KWIKPEN 100 UNIT/ML KiwkPen Generic drug:  insulin lispro Inject 22 Units into the skin 3 (three) times daily with meals.   levocetirizine 5 MG tablet Commonly known as:  XYZAL Take 2.5 mg by mouth every other day.   LUMIGAN 0.01 % Soln Generic drug:  bimatoprost Place 1 drop into both eyes at bedtime.   Magnesium 500 MG Caps Take 1,000 mg by mouth at bedtime.   metFORMIN 500 MG 24 hr tablet Commonly known as:  GLUCOPHAGE-XR Take 500 mg by mouth daily with breakfast.   metoprolol tartrate 25 MG tablet Commonly known as:  LOPRESSOR Take 1  tablet (25 mg total) by mouth 2 (two) times daily.   MYRBETRIQ 50 MG Tb24 tablet Generic drug:  mirabegron ER Take 50 mg by mouth daily.   nitroGLYCERIN 0.4 MG SL tablet Commonly known as:  NITROSTAT Place 0.4 mg under the tongue every 5 (five) minutes as needed for chest pain.   nystatin cream Commonly known as:  MYCOSTATIN Apply 1 application topically daily.   pantoprazole 40 MG tablet Commonly known as:  PROTONIX Take 1 tablet (40 mg total) by mouth daily.   PARoxetine 20 MG tablet Commonly known as:  PAXIL Take 1 tablet (20 mg total) by mouth daily.   polyethylene glycol packet Commonly known as:  MIRALAX / GLYCOLAX Take 17 g by mouth daily as needed for moderate constipation.   Rivaroxaban 15 MG Tabs tablet Commonly known as:  XARELTO Take 15 mg by mouth every evening.   rOPINIRole 0.5 MG tablet Commonly known as:  REQUIP TAKE 1 TABLET BY MOUTH AT  BEDTIME   sitaGLIPtin 50 MG tablet Commonly known as:  JANUVIA Take 50 mg by mouth daily.   STOOL SOFTENER 100 MG capsule Generic drug:  docusate sodium Take 100 mg by mouth at bedtime.   timolol 0.5 % ophthalmic solution Commonly known as:  TIMOPTIC Place 1 drop into both eyes every morning.   [place]  300 UNIT/ML Sopn Generic drug:  Insulin Glargine Inject 85 Units into the skin at bedtime.   traMADol 50 MG tablet Commonly known as:  ULTRAM Take 1 tablet (50 mg total) by mouth 2 (two) times daily.       Contact information for follow-up providers    [facility] Follow up in 4 week(s).   Specialty:  Neurology Why:  stroke clinic. office will call with appt date and time. Contact information: [address] [place] [phone]           Contact information for after-discharge care    Destination    [facility] .   Service:  Skilled Nursing Contact information: [address] [place]  [place] [phone]                  Consultations:  Neurology   Procedures/Studies:  2D echo  Study Conclusions - Left ventricle: The cavity size was normal. There was mild concentric hypertrophy. Systolic function was normal. The estimated ejection fraction was in the range of 60% to 65%. Wall motion was normal; there were no regional wall motion abnormalities. Doppler parameters are consistent with abnormal left ventricular relaxation (grade 1 diastolic dysfunction). Doppler parameters are consistent with high ventricular filling pressure. - Aortic valve: Transvalvular velocity was within the normal range. There was no stenosis. There was no regurgitation. Valve area (VTI): 2.94 cm^2. Valve area (Vmax): 2.84 cm^2. Valve area (Vmean): 2.89 cm^2. - Mitral valve: Transvalvular velocity was within the normal range. There was no evidence for stenosis. There was no regurgitation. Valve area by pressure half-time: 1.21 cm^2. - Right ventricle: The cavity size was normal. Wall thickness was normal. Systolic function was normal. - Tricuspid valve: There was trivial regurgitation. - Pulmonary arteries: Systolic pressure was within the normal range. PA peak pressure: 28 mm Hg (S).   Ct Angio Head W Or Wo Contrast  Result Date: 07/21/2017 CLINICAL DATA:  New onset right-sided weakness. EXAM: CT ANGIOGRAPHY HEAD AND NECK TECHNIQUE: Multidetector CT imaging of the head and neck was performed using the standard protocol during bolus administration of intravenous contrast. Multiplanar CT image reconstructions and MIPs were obtained to evaluate the vascular anatomy. Carotid stenosis measurements (when applicable) are obtained utilizing NASCET criteria, using the distal internal carotid diameter as the denominator. CONTRAST:  69mL ISOVUE-370 IOPAMIDOL (ISOVUE-370) INJECTION 76% COMPARISON:  CT head without contrast 07/21/2017 and 01/20/2017. MRI brain and MRA head 05/18/2015. FINDINGS: CTA NECK  FINDINGS Aortic arch: A 3 vessel arch configuration is present. Atherosclerotic calcifications are present origin of the left subclavian artery without a significant stenosis. Right carotid system: The right common carotid artery is within normal limits. Bifurcation is unremarkable. There is tortuosity of the cervical right ICA. Irregularity is present in the mid cervical right ICA without significant stenosis. Left carotid system: The left common carotid artery is within normal limits. The bifurcation unremarkable. There is focal irregularity of the mid left cervical ICA. No significant luminal stenosis is present. Overall caliber of the left internal carotid artery is smaller than the right. Vertebral arteries: The right vertebral artery is slightly dominant to the left. Both vertebral arteries originate from the subclavian arteries without significant stenosis. There is no significant stenosis or focal vessel irregularity within either vertebral artery in the neck. Skeleton: Endplate degenerative changes are evident from C3-4 through C6-7. Osseous foraminal narrowing is greatest on the left at C5-6 and on the right at C4-5 and C6-7.  Other neck: Multiple hypodense thyroid nodules are present. These measure less than 10 mm. There is no dominant lesion. No significant cervical adenopathy is present. Salivary glands are within normal limits. No focal mucosal or submucosal lesions are evident. Vocal cords are midline and symmetric. Upper chest: Scattered ground-glass attenuation is present in the lung is bilaterally. No focal airspace consolidation is present. There is no nodule or mass lesion. Review of the MIP images confirms the above findings CTA HEAD FINDINGS Anterior circulation: No atherosclerotic calcifications are present within the cavernous internal carotid arteries bilaterally without significant stenosis. The ICA termini are within normal limits bilaterally. The A1 and M1 segments are normal. The anterior  communicating artery is present. MCA bifurcations are within normal limits. The proximal MCA branch vessels are within normal limits bilaterally. There is some distal attenuation bilaterally. No significant proximal stenosis or occlusion is present. There is moderate focal stenosis within the mid left A2 segment. There is some attenuation of distal ACA branch vessels bilaterally. Posterior circulation: The right vertebral artery is slightly dominant. PICA origins are visualized and normal. The basilar artery is normal. Both posterior cerebral arteries originate from basilar tip. A moderate stenosis is present in the right P2 segment. There is asymmetric attenuation of PCA branch vessels on the right. No significant proximal branch vessel occlusion is present. Venous sinuses: The dural sinuses are patent. The straight sinus and deep cerebral veins are intact. Cortical veins are unremarkable. Anatomic variants: None Review of the MIP images confirms the above findings IMPRESSION: 1. Atherosclerotic changes at the origin of the left subclavian artery without significant stenosis in the neck. 2. Vessel irregularity in the mid cervical segments bilaterally, left more prominent than right. Findings are consistent with fibromuscular dysplasia. 3. Atherosclerotic changes within the cavernous internal carotid arteries bilaterally without a significant stenosis. 4. No significant proximal stenosis, aneurysm, or branch vessel occlusion within the circle-of-Willis. 5. Moderate left A2/A3 segment stenosis. 6. Mild multilevel degenerative changes of the cervical spine. 7. Ground-glass attenuation the lung apices bilaterally likely reflects edema or atelectasis. Electronically Signed   By: San Morelle M.D.   On: 07/21/2017 18:41   Dg Tibia/fibula Right  Result Date: 07/21/2017 CLINICAL DATA:  Right leg pain.  Patient is unresponsive. EXAM: RIGHT TIBIA AND FIBULA - 2 VIEW COMPARISON:  None. FINDINGS: Right tibia and  fibula appear intact. No evidence of acute fracture or dislocation. No focal bone lesion or bone destruction. Soft tissues are unremarkable. IMPRESSION: No acute bony abnormalities. Electronically Signed   By: Lucienne Capers M.D.   On: 07/21/2017 21:16   Ct Angio Neck W Or Wo Contrast  Result Date: 07/21/2017 CLINICAL DATA:  New onset right-sided weakness. EXAM: CT ANGIOGRAPHY HEAD AND NECK TECHNIQUE: Multidetector CT imaging of the head and neck was performed using the standard protocol during bolus administration of intravenous contrast. Multiplanar CT image reconstructions and MIPs were obtained to evaluate the vascular anatomy. Carotid stenosis measurements (when applicable) are obtained utilizing NASCET criteria, using the distal internal carotid diameter as the denominator. CONTRAST:  38mL ISOVUE-370 IOPAMIDOL (ISOVUE-370) INJECTION 76% COMPARISON:  CT head without contrast 07/21/2017 and 01/20/2017. MRI brain and MRA head 05/18/2015. FINDINGS: CTA NECK FINDINGS Aortic arch: A 3 vessel arch configuration is present. Atherosclerotic calcifications are present origin of the left subclavian artery without a significant stenosis. Right carotid system: The right common carotid artery is within normal limits. Bifurcation is unremarkable. There is tortuosity of the cervical right ICA. Irregularity is present in the mid  cervical right ICA without significant stenosis. Left carotid system: The left common carotid artery is within normal limits. The bifurcation unremarkable. There is focal irregularity of the mid left cervical ICA. No significant luminal stenosis is present. Overall caliber of the left internal carotid artery is smaller than the right. Vertebral arteries: The right vertebral artery is slightly dominant to the left. Both vertebral arteries originate from the subclavian arteries without significant stenosis. There is no significant stenosis or focal vessel irregularity within either vertebral artery  in the neck. Skeleton: Endplate degenerative changes are evident from C3-4 through C6-7. Osseous foraminal narrowing is greatest on the left at C5-6 and on the right at C4-5 and C6-7. Other neck: Multiple hypodense thyroid nodules are present. These measure less than 10 mm. There is no dominant lesion. No significant cervical adenopathy is present. Salivary glands are within normal limits. No focal mucosal or submucosal lesions are evident. Vocal cords are midline and symmetric. Upper chest: Scattered ground-glass attenuation is present in the lung is bilaterally. No focal airspace consolidation is present. There is no nodule or mass lesion. Review of the MIP images confirms the above findings CTA HEAD FINDINGS Anterior circulation: No atherosclerotic calcifications are present within the cavernous internal carotid arteries bilaterally without significant stenosis. The ICA termini are within normal limits bilaterally. The A1 and M1 segments are normal. The anterior communicating artery is present. MCA bifurcations are within normal limits. The proximal MCA branch vessels are within normal limits bilaterally. There is some distal attenuation bilaterally. No significant proximal stenosis or occlusion is present. There is moderate focal stenosis within the mid left A2 segment. There is some attenuation of distal ACA branch vessels bilaterally. Posterior circulation: The right vertebral artery is slightly dominant. PICA origins are visualized and normal. The basilar artery is normal. Both posterior cerebral arteries originate from basilar tip. A moderate stenosis is present in the right P2 segment. There is asymmetric attenuation of PCA branch vessels on the right. No significant proximal branch vessel occlusion is present. Venous sinuses: The dural sinuses are patent. The straight sinus and deep cerebral veins are intact. Cortical veins are unremarkable. Anatomic variants: None Review of the MIP images confirms the  above findings IMPRESSION: 1. Atherosclerotic changes at the origin of the left subclavian artery without significant stenosis in the neck. 2. Vessel irregularity in the mid cervical segments bilaterally, left more prominent than right. Findings are consistent with fibromuscular dysplasia. 3. Atherosclerotic changes within the cavernous internal carotid arteries bilaterally without a significant stenosis. 4. No significant proximal stenosis, aneurysm, or branch vessel occlusion within the circle-of-Willis. 5. Moderate left A2/A3 segment stenosis. 6. Mild multilevel degenerative changes of the cervical spine. 7. Ground-glass attenuation the lung apices bilaterally likely reflects edema or atelectasis. Electronically Signed   By: San Morelle M.D.   On: 07/21/2017 18:41   Mr Brain Wo Contrast  Result Date: 07/22/2017 CLINICAL DATA:  Acute somnolence and speech difficulty. Chronic RIGHT-sided motor deficits. EXAM: MRI HEAD WITHOUT CONTRAST TECHNIQUE: Multiplanar, multiecho pulse sequences of the brain and surrounding structures were obtained without intravenous contrast. COMPARISON:  CT head and CTA head neck 07/21/2017. FINDINGS: Brain: Restricted diffusion affects the LEFT lentiform nucleus, and periventricular white matter, consistent with an acute LEFT MCA lenticulostriate infarct. No hemorrhage, mass lesion, or extra-axial fluid. Hydrocephalus ex vacuo. Atrophy with small vessel disease. Chronic lacunar infarcts affect the BILATERAL thalamus and BILATERAL basal ganglia. Vascular: Flow voids are maintained. Skull and upper cervical spine: Normal marrow signal. Pannus surrounds C1-C2.  Sinuses/Orbits: No layering sinus fluid.  Negative orbits. Other: None. IMPRESSION: Acute LEFT MCA lenticulostriate territory infarct, nonhemorrhagic. Involvement of the LEFT lentiform nucleus and periventricular white matter. Chronic changes as described. Electronically Signed   By: Staci Righter M.D.   On: 07/22/2017  19:30   Dg Knee Complete 4 Views Right  Result Date: 07/21/2017 CLINICAL DATA:  Right leg pain.  Patient is unresponsive. EXAM: RIGHT KNEE - COMPLETE 4+ VIEW COMPARISON:  None. FINDINGS: Mild degenerative changes in the right knee with medial compartment narrowing. No evidence of acute fracture or dislocation. No focal bone lesion or bone destruction. Bone cortex appears intact. No significant effusion. Soft tissues are unremarkable. IMPRESSION: Mild degenerative changes in the right knee. No acute bony abnormalities. Electronically Signed   By: Lucienne Capers M.D.   On: 07/21/2017 21:16   Dg Swallowing Func-speech Pathology  Result Date: 07/23/2017 Objective Swallowing Evaluation: Type of Study: MBS-Modified Barium Swallow Study  Patient Details Name: Abigail Boyd MRN: [id number] Date of Birth: 1930/01/18 Today's Date: 07/23/2017 Time: SLP Start Time (ACUTE ONLY): 0935 -SLP Stop Time (ACUTE ONLY): 1004 SLP Time Calculation (min) (ACUTE ONLY): 29 min Past Medical History: Past Medical History: Diagnosis Date . Abnormal nuclear stress test 06/21/2011  Inferolateral reversible defect;--> cardiac cath & PCI of CxOM, occluded RCA with collaterals;; followup Myoview 11/2012: Low risk. Fixed basal inferior artifact normal EF. No ischemia . Anxiety   When necessary Xanax . Anxiety  . Asthma  . Bilateral cataracts   Status post stroke or correction . CAD S/P percutaneous coronary angioplasty 06/2011  s/P PCI to proximal OM1 w/ DES; occluded RCA with bridging and L-R collaterals (medical management) . Chronic kidney disease (CKD), stage II (mild)   Related to current bladder infections (although diabetes cannot be excluded) . Chronotropic incompetence with sinus node dysfunction [facility]) October 2013  On CPET test; beta blockers reduced . Diabetes mellitus type 2, controlled ([place])   On oral medications . Diverticulitis  . Essential hypertension   Allowing for permissive hypertension to avoid orthostatic hypotension .  GERD (gastroesophageal reflux disease)   On PPI . Glaucoma  . History of syncope   Per EP - neurocardiogenic & not Bradycardia related (no PPM) . History of unstable angina 06/13/2011  Jaw pain awakening from sleep -- Myoview --CATH --> PCI . Hyperlipidemia with target LDL less than 70   HDL at goal, LDL not at goal, borderline triglycerides. On Crestor 20 mg . Migraines  . OSA (obstructive sleep apnea)   hx bladder infections . Osteoarthritis  . PAF (paroxysmal atrial fibrillation) ([place]) 10-11/ 2014  CardioNet Event Monitor: NSR & S Brady -- Rates 50-100; Total A. fib burden 35 hours and 27 minutes. 1296 episodes, longest was 1 hour 29 minutes. Rate ranged from 52-169 beats per minute. . Seasonal allergies  . Shortness of breath on exertion October 2013  2-D echo: Normal EF>55%, Gr 1DD, mild aortic sclerosis; Evaluated with CPET - peak VO2 97%; Chronotropic Incompetence (submaximal effort) . Spinal stenosis of lumbar region 11/2011 . Stroke ([place])  . Tachycardia-bradycardia syndrome ([place]) 11/2012 . Urge incontinence  Past Surgical History: Past Surgical History: Procedure Laterality Date . APPENDECTOMY   . BREAST BIOPSY    both breast . cataracts    both eyes . COLONOSCOPY   . CORONARY ANGIOPLASTY WITH STENT PLACEMENT  07/05/2011  Promus Element DES 2.5 mm x 16 mm-post dilated to 2.65 mm CX-Proximal OM1 . Holter Monitor  04/2015  Sinus rhythm with rates 52-149 BPM.  Isolated PACs with rare couplets and bigeminy. Multiple short runs of PAT/PSVT. Arrhythmia run 120 bpm for 204 beats. 14% of time was in A. fib/flutter. - Reviewed by Dr. Caryl Comes. Not thought to be significant enough for her syncope. Marland Kitchen KNEE ARTHROSCOPY WITH MEDIAL MENISECTOMY Right 06/24/2014  Procedure: RIGHT KNEE ARTHROSCOPY WITH partial lateral MENISECTOMY, abrasion chondroplasty of medial femoral condyle and patella, microfracture technique;  Surgeon: Latanya Maudlin, MD;  Location: [facility];  Service: Orthopedics;  Laterality: Right; . LEFT HEART  CATHETERIZATION WITH CORONARY ANGIOGRAM N/A 07/06/2011  Procedure: LEFT HEART CATHETERIZATION WITH CORONARY ANGIOGRAM;  Surgeon: Leonie Man, MD;  Location: [facility] CATH LAB: Unstable Angina --> Myoview wiht Inf-Lat Ischemia.  Proximal OM1 lesion --> PCI; 100% mid RCA with right to right and left to right bridging collaterals from circumflex RPL and LAD the PDA. Marland Kitchen NM MYOVIEW LTD  October 2014   Low risk. Fixed basal inferior artifact normal EF. No ischemia --> (as compared to pre-PCI Myoview revealing inferolateral ischemia) . rotator cuff Right  . TRANSTHORACIC ECHOCARDIOGRAM  11/2014  EF 65-70%, Mod LVH. Normal wall motion. Gr 1 DD. Normal valves. Marland Kitchen VAGINAL HYSTERECTOMY   HPI: Abigail Boyd is an 84 y.o. female who has been in short term rehab for reconditioning and IV abx for UTI due to baseline debility post stroke in 2016. Baseline mRS 4, as she needs help with all ADLs, uses a walker or w/c currently per facility. The pt was given Xanex after her therapy on the afternoon of 5/25.  When pts daughter arrived for visit, she was not talking or responding normally and the daughter called EMS. Due to residual rt side weakness and facial droop, EMS called a pre-arrival code stroke. Upon arrival she was unable to answer questions, lethargic and dysarthric speech. She had a right side facial droop and not using right side as briskly. CTH shows sevral old bilateral infarcts. No acute infarcts seen nor LVO. Swallow evaluation ordered.  Subjective: MRI showed L MCA infarct Assessment / Plan / Recommendation CHL IP CLINICAL IMPRESSIONS 07/23/2017 Clinical Impression Patient presents with moderate oropharyngeal dysphagia due to sensory and motor deficits; there is sensed aspiration of thin liquids and straw sips of nectar. Oral stage characterized by impaired mastication, decreased bolus cohesion, delayed oral transit. There is mild-moderate lingual residue with solids, as well as mild oral holding with liquids, right anterior  spillage with thin liquids due to decreased labial seal. Swallow initiation delayed to valleculae for most consistencies, but pyriform sinuses with thin liquids and straw sips of nectar. There is decreased/sluggish hyolarygneal excursion with delayed closure of laryngeal vestibule leading to penetration and aspiration before the swallow thin and straw sips of nectar. Pt did elicit a cough response which cleared remaining penetration but not aspiration. Chin tuck did not prevent deep penetration/questionable trace aspiration of thin liquids. No penetration/aspiration with cup sips of nectar. No abnormal residue remained in the pharynx after the swallow, with the exception of barium tablet which was retained in the valleculae and cleared with reflexive subsequent swallow. Cervical esophageal phase was unremarkable. Limited esophageal sweep performed due to positioning constraints. Recommend initiating dys 2, nectar thick liquids, NO STRAWS, meds whole with nectar thick liquid. Pt would benefit from interventions to address strengthening of hyolaryngeal complex, cough. Will continue to follow. MD, please order SLP cognitive-linguistic evaluation.  SLP Visit Diagnosis Dysphagia, oropharyngeal phase (R13.12) Attention and concentration deficit following -- Frontal lobe and executive function deficit following -- Impact on safety and function  Mild aspiration risk;Moderate aspiration risk   CHL IP TREATMENT RECOMMENDATION 07/23/2017 Treatment Recommendations Therapy as outlined in treatment plan below;F/U MBS in --- days (Comment)   Prognosis 07/23/2017 Prognosis for Safe Diet Advancement Good Barriers to Reach Goals Language deficits Barriers/Prognosis Comment -- CHL IP DIET RECOMMENDATION 07/23/2017 SLP Diet Recommendations Dysphagia 2 (Fine chop) solids;Nectar thick liquid Liquid Administration via Cup;No straw Medication Administration Whole meds with liquid Compensations Slow rate;Small sips/bites Postural Changes Seated  upright at 90 degrees   CHL IP OTHER RECOMMENDATIONS 07/23/2017 Recommended Consults Other (Comment) Oral Care Recommendations Oral care BID Other Recommendations Remove water pitcher;Have oral suction available   CHL IP FOLLOW UP RECOMMENDATIONS 07/23/2017 Follow up Recommendations Other (comment)   CHL IP FREQUENCY AND DURATION 07/23/2017 Speech Therapy Frequency (ACUTE ONLY) min 2x/week Treatment Duration 2 weeks      CHL IP ORAL PHASE 07/23/2017 Oral Phase Impaired Oral - Pudding Teaspoon -- Oral - Pudding Cup -- Oral - Honey Teaspoon WFL Oral - Honey Cup -- Oral - Nectar Teaspoon Holding of bolus Oral - Nectar Cup Holding of bolus Oral - Nectar Straw Holding of bolus Oral - Thin Teaspoon Right anterior bolus loss;Holding of bolus;Decreased bolus cohesion Oral - Thin Cup Right anterior bolus loss;Decreased bolus cohesion Oral - Thin Straw -- Oral - Puree Decreased bolus cohesion;Delayed oral transit Oral - Mech Soft -- Oral - Regular Impaired mastication;Reduced posterior propulsion;Delayed oral transit;Decreased bolus cohesion Oral - Multi-Consistency -- Oral - Pill Delayed oral transit;Decreased bolus cohesion Oral Phase - Comment --  CHL IP PHARYNGEAL PHASE 07/23/2017 Pharyngeal Phase Impaired Pharyngeal- Pudding Teaspoon -- Pharyngeal -- Pharyngeal- Pudding Cup -- Pharyngeal -- Pharyngeal- Honey Teaspoon Delayed swallow initiation-vallecula;Reduced anterior laryngeal mobility;Reduced laryngeal elevation Pharyngeal -- Pharyngeal- Honey Cup -- Pharyngeal -- Pharyngeal- Nectar Teaspoon Delayed swallow initiation-vallecula;Reduced anterior laryngeal mobility;Reduced laryngeal elevation Pharyngeal -- Pharyngeal- Nectar Cup Delayed swallow initiation-vallecula;Reduced laryngeal elevation;Reduced anterior laryngeal mobility Pharyngeal -- Pharyngeal- Nectar Straw Delayed swallow initiation-pyriform sinuses;Reduced anterior laryngeal mobility;Reduced laryngeal elevation;Reduced airway/laryngeal  closure;Penetration/Aspiration before swallow;Trace aspiration Pharyngeal Material enters airway, passes BELOW cords and not ejected out despite cough attempt by patient Pharyngeal- Thin Teaspoon Delayed swallow initiation-vallecula;Reduced laryngeal elevation;Reduced anterior laryngeal mobility Pharyngeal -- Pharyngeal- Thin Cup Delayed swallow initiation-pyriform sinuses;Reduced anterior laryngeal mobility;Reduced laryngeal elevation;Reduced airway/laryngeal closure;Penetration/Aspiration before swallow;Moderate aspiration Pharyngeal Material enters airway, passes BELOW cords and not ejected out despite cough attempt by patient Pharyngeal- Thin Straw -- Pharyngeal -- Pharyngeal- Puree Delayed swallow initiation-vallecula;Reduced epiglottic inversion;Reduced anterior laryngeal mobility Pharyngeal -- Pharyngeal- Mechanical Soft -- Pharyngeal -- Pharyngeal- Regular Delayed swallow initiation-vallecula;Reduced anterior laryngeal mobility;Reduced laryngeal elevation Pharyngeal -- Pharyngeal- Multi-consistency -- Pharyngeal -- Pharyngeal- Pill Delayed swallow initiation-vallecula;Pharyngeal residue - valleculae;Reduced anterior laryngeal mobility;Reduced laryngeal elevation Pharyngeal -- Pharyngeal Comment --  CHL IP CERVICAL ESOPHAGEAL PHASE 07/23/2017 Cervical Esophageal Phase WFL Pudding Teaspoon -- Pudding Cup -- Honey Teaspoon -- Honey Cup -- Nectar Teaspoon -- Nectar Cup -- Nectar Straw -- Thin Teaspoon -- Thin Cup -- Thin Straw -- Puree -- Mechanical Soft -- Regular -- Multi-consistency -- Pill -- Cervical Esophageal Comment -- No flowsheet data found. Aliene Altes 07/23/2017, 11:08 AM  Deneise Lever, MS, CCC-SLP Speech-Language Pathologist [phone]             Dg Hip Unilat W Or Wo Pelvis 2-3 Views Right  Result Date: 07/21/2017 CLINICAL DATA:  Right leg pain.  Patient is unresponsive. EXAM: DG HIP (WITH OR WITHOUT PELVIS) 2-3V RIGHT COMPARISON:  None. FINDINGS: Severe degenerative changes in the right hip  with  loss of superior acetabular joint space and bone-on-bone appearance. There is remodeling and sclerosis of the right femoral head. This may indicate severe degenerative changes or avascular necrosis. No evidence of acute fracture or dislocation of the pelvis or right hip. SI joints and symphysis pubis are not displaced. Residual contrast material in the bladder. Degenerative changes in the lower lumbar spine and in the left hip. Vascular calcifications. IMPRESSION: Severe degenerative changes in the right hip. Remodeling and sclerosis of the right femoral head may indicate severe degenerative changes or avascular necrosis. No acute fracture or dislocation. Electronically Signed   By: Lucienne Capers M.D.   On: 07/21/2017 21:15   Ct Head Code Stroke Wo Contrast  Result Date: 07/21/2017 CLINICAL DATA:  Code stroke. New onset of right-sided weakness. The patient is on blood thinners. EXAM: CT HEAD WITHOUT CONTRAST TECHNIQUE: Contiguous axial images were obtained from the base of the skull through the vertex without intravenous contrast. COMPARISON:  CT head without contrast 01/20/2017. FINDINGS: Brain: Paraventricular white matter disease similar the prior exam. A remote lacunar infarct of the right internal capsule is stable. Remote lacunar infarcts are present in the thalami bilaterally. A remote lacunar infarct is present in the left caudate head. No new infarcts are present. The insular cortex is intact. The brainstem and cerebellum are normal. Vascular: Atherosclerotic calcifications are present within the cavernous internal carotid arteries bilaterally. There is no hyperdense vessel. Skull: Calvarium is intact. Sinuses/Orbits: A fluid level is present within the sphenoid sinuses. Paranasal sinuses are otherwise clear. The mastoid air cells are clear. ASPECTS (Freedom Stroke Program Early CT Score) - Ganglionic level infarction (caudate, lentiform nuclei, internal capsule, insula, M1-M3 cortex): 7/7 -  Supraganglionic infarction (M4-M6 cortex): 3/3 Total score (0-10 with 10 being normal): 10/10 IMPRESSION: 1. Stable appearance of remote lacunar infarcts involving the basal ganglia bilaterally and the right internal capsule. 2. No acute intracranial abnormality. 3. Stable atrophy and white matter disease. This likely reflects the sequela of chronic microvascular ischemia. 4. Sphenoid sinus disease. 5. ASPECTS is 10/10 The above was relayed via text pager to Dr. Lorraine Lax on 07/21/2017 at 17:59 . Electronically Signed   By: San Morelle M.D.   On: 07/21/2017 18:00   [place] Ekg Site Rite  Result Date: 07/07/2017 If Site Rite image not attached, placement could not be confirmed due to current cardiac rhythm.    Subjective: -no complaints, appears depressed.   Discharge Exam: Vitals:   07/25/17 0925 07/25/17 1200  BP: (!) 148/69 (!) 157/58  Pulse: 75 68  Resp: 18 18  Temp:  97.6 F (36.4 C)  SpO2:  96%    General: Pt is alert, awake, not in acute distress Cardiovascular: irregular  Respiratory: CTA bilaterally, no wheezing, no rhonchi Abdominal: Soft, NT, ND, bowel sounds + Extremities: no edema, no cyanosis    The results of significant diagnostics from this hospitalization (including imaging, microbiology, ancillary and laboratory) are listed below for reference.     Microbiology: Recent Results (from the past 240 hour(s))  Urine culture     Status: Abnormal   Collection Time: 07/21/17 10:37 PM  Result Value Ref Range Status   Specimen Description URINE, CLEAN CATCH  Final   Special Requests NONE  Final   Culture (A)  Final    50,000 COLONIES/mL KLEBSIELLA PNEUMONIAE Confirmed Extended Spectrum Beta-Lactamase Producer (ESBL).  In bloodstream infections from ESBL organisms, carbapenems are preferred over piperacillin/tazobactam. They are shown to have a lower risk of mortality. Performed at [facility]  [facility], [place] [address] [place], [place] [address]    Report Status  07/24/2017 FINAL  Final   Organism ID, Bacteria KLEBSIELLA PNEUMONIAE (A)  Final      Susceptibility   Klebsiella pneumoniae - MIC*    AMPICILLIN >=32 RESISTANT Resistant     CEFAZOLIN >=64 RESISTANT Resistant     CEFTRIAXONE >=64 RESISTANT Resistant     CIPROFLOXACIN >=4 RESISTANT Resistant     GENTAMICIN >=16 RESISTANT Resistant     IMIPENEM <=0.25 SENSITIVE Sensitive     NITROFURANTOIN 256 RESISTANT Resistant     TRIMETH/SULFA >=320 RESISTANT Resistant     AMPICILLIN/SULBACTAM >=32 RESISTANT Resistant     PIP/TAZO >=128 RESISTANT Resistant     Extended ESBL POSITIVE Resistant     * 50,000 COLONIES/mL KLEBSIELLA PNEUMONIAE     Labs: BNP (last 3 results) No results for input(s): BNP in the last 8760 hours. Basic Metabolic Panel: Recent Labs  Lab 07/21/17 1743 07/21/17 1749 07/22/17 1526 07/24/17 0507  NA 136 139 138 144  K 4.2 4.2 4.6 4.1  CL 95* 94* 98* 109  CO2 28  --  24 25  GLUCOSE 221* 218* 242* 121*  BUN 18 20 14 18   CREATININE 1.21* 1.00 0.99 1.13*  CALCIUM 9.7  --  9.8 9.4   Liver Function Tests: Recent Labs  Lab 07/21/17 1743  AST 23  ALT 25  ALKPHOS 85  BILITOT 0.4  PROT 7.6  ALBUMIN 3.6   No results for input(s): LIPASE, AMYLASE in the last 168 hours. Recent Labs  Lab 07/22/17 0026  AMMONIA 36*   CBC: Recent Labs  Lab 07/21/17 1743 07/21/17 1749 07/22/17 0026 07/22/17 1526 07/24/17 0507  WBC 10.1  --   --  12.3* 10.5  NEUTROABS 7.6  --   --   --   --   HGB 13.8 14.6  --  15.0 12.9  HCT 43.3 43.0 36.2 46.4* 39.5  MCV 94.5  --   --  94.5 93.8  PLT 323  --   --  339 331   Cardiac Enzymes: No results for input(s): CKTOTAL, CKMB, CKMBINDEX, TROPONINI in the last 168 hours. BNP: Invalid input(s): POCBNP CBG: Recent Labs  Lab 07/24/17 2012 07/24/17 2345 07/25/17 0416 07/25/17 0729 07/25/17 1122  GLUCAP 289* 196* 140* 172* 225*   D-Dimer No results for input(s): DDIMER in the last 72 hours. Hgb A1c Recent Labs     07/22/17 1526  HGBA1C 7.8*   Lipid Profile Recent Labs    07/24/17 0507  CHOL 234*  HDL 33*  LDLCALC 157*  TRIG 218*  CHOLHDL 7.1   Thyroid function studies No results for input(s): TSH, T4TOTAL, T3FREE, THYROIDAB in the last 72 hours.  Invalid input(s): FREET3 Anemia work up No results for input(s): VITAMINB12, FOLATE, FERRITIN, TIBC, IRON, RETICCTPCT in the last 72 hours. Urinalysis    Component Value Date/Time   COLORURINE STRAW (A) 07/21/2017 2232   APPEARANCEUR CLEAR 07/21/2017 2232   APPEARANCEUR Cloudy (A) 06/04/2017 1507   LABSPEC 1.017 07/21/2017 2232   PHURINE 8.0 07/21/2017 2232   GLUCOSEU NEGATIVE 07/21/2017 2232   GLUCOSEU NEGATIVE 08/19/2015 1630   HGBUR NEGATIVE 07/21/2017 2232   BILIRUBINUR NEGATIVE 07/21/2017 2232   BILIRUBINUR Negative 06/04/2017 [place] 07/21/2017 2232   PROTEINUR NEGATIVE 07/21/2017 2232   UROBILINOGEN 0.2 04/12/2017 1355   UROBILINOGEN 0.2 08/19/2015 1630   NITRITE NEGATIVE 07/21/2017 2232   LEUKOCYTESUR TRACE (A) 07/21/2017 2232   LEUKOCYTESUR  3+ (A) 06/04/2017 1507   Sepsis Labs Invalid input(s): PROCALCITONIN,  WBC,  LACTICIDVEN   Time coordinating discharge: 35 minutes  SIGNED:  Marzetta Board, MD  [name] 07/25/2017, 3:20 PM Pager [phone]  If 7PM-7AM, please contact night-coverage [email or web address] Password [email or web address]

## 2017-07-25 NOTE — Clinical Social Work Placement (Signed)
Nurse to call report to [phone], Room 213    CLINICAL SOCIAL WORK PLACEMENT  NOTE  Date:  07/25/2017  Patient Details  Name: DELTA PICHON MRN: [id number] Date of Birth: 1929-12-19  Clinical Social Work is seeking post-discharge placement for this patient at the [place] level of care (*CSW will initial, date and re-position this form in  chart as items are completed):  Yes   Patient/family provided with [place] Work Department's list of facilities offering this level of care within the geographic area requested by the patient (or if unable, by the patient's family).  Yes   Patient/family informed of their freedom to choose among providers that offer the needed level of care, that participate in Medicare, Medicaid or managed care program needed by the patient, have an available bed and are willing to accept the patient.  Yes   Patient/family informed of 's ownership interest in [facility] and [facility], as well as of the fact that they are under no obligation to receive care at these facilities.  PASRR submitted to EDS on       PASRR number received on 07/23/17     Existing PASRR number confirmed on 07/23/17     FL2 transmitted to all facilities in geographic area requested by pt/family on 07/23/17     FL2 transmitted to all facilities within larger geographic area on       Patient informed that his/her managed care company has contracts with or will negotiate with certain facilities, including the following:        Yes   Patient/family informed of bed offers received.  Patient chooses bed at [facility]     Physician recommends and patient chooses bed at      Patient to be transferred to [facility] on 07/25/17.  Patient to be transferred to facility by [facility]     Patient family notified on 07/25/17 of transfer.  Name of family member notified:  [name]     PHYSICIAN       Additional Comment:     _______________________________________________ Geralynn Ochs, [place] 07/25/2017, 2:45 PM

## 2017-07-25 NOTE — Progress Notes (Signed)
Called SNF, spoke with [facility], report given to SNF nurse.

## 2017-07-25 NOTE — Consult Note (Addendum)
   [facility] CM Inpatient Consult   07/25/2017  JANIAH DEVINNEY 01-25-1930 [id number]   Patient screened for potential [facility] Care Management needs due to hospital readmission.  Spoke with inpatient LCSW who indicates patient will discharge to SNF today.  Spoke with patient's daughterLoreli Slot at bedside. Discussed [facility] Care Management follow up.  Daughter is agreeable and [facility] Care Management consent obtained.  Loreli Slot (daughter) states she could use the additional follow up for medication review upon discharge from SNF.  Confirmed Loreli Slot (daughter) [phone] to be contacted for post discharge calls.  Confirmed Primary Care MD is Dr. Caryl Bis (Velora Heckler at [facility] is listed as doing transition of care calls).  Prior to hospitalization, patient lived with daughter, [name].  Mrs. Keil is slated for discharge to [facility] SNF today.  Will make referral to [facility] LCSW for follow up while at SNF.   Marthenia Rolling, MSN-Ed, RN,BSN [facility] Liaison [phone]

## 2017-07-26 ENCOUNTER — Ambulatory Visit: Payer: Medicare Other | Admitting: Cardiology

## 2017-07-26 ENCOUNTER — Other Ambulatory Visit: Payer: Self-pay | Admitting: Pharmacist

## 2017-07-26 DIAGNOSIS — N183 Chronic kidney disease, stage 3 (moderate): Secondary | ICD-10-CM | POA: Diagnosis not present

## 2017-07-26 DIAGNOSIS — I69320 Aphasia following cerebral infarction: Secondary | ICD-10-CM | POA: Diagnosis not present

## 2017-07-26 DIAGNOSIS — E1122 Type 2 diabetes mellitus with diabetic chronic kidney disease: Secondary | ICD-10-CM | POA: Diagnosis not present

## 2017-07-26 DIAGNOSIS — I4891 Unspecified atrial fibrillation: Secondary | ICD-10-CM | POA: Diagnosis not present

## 2017-07-26 DIAGNOSIS — Z794 Long term (current) use of insulin: Secondary | ICD-10-CM | POA: Diagnosis not present

## 2017-07-27 NOTE — Patient Outreach (Signed)
[place] [facility]) Care Management  07/27/2017  Abigail Boyd October 22, 1929 [id number]   84 y.o. year old female referred to [place] for Medication Management (Initial pharmacy outreach) Referred by Abigail Boyd, [facility] Liaison for medication review. Patient was hospitalized 07/21/17-07/25/17 for aphasia. Discharged to [facility].   PMH includes but is not limited to: history of CVA with right sided motor deficit, CHF, afiv, diabetes mellitus, CKD (stage II), hypertension, OSA, recurrent UTI  Patient with [employer].   Spoke with patient's daughter, Abigail Boyd (written consent on file). Patient's daughter confirms identity with HIPAA-identifiers x2 and gives verbal consent to speak over the phone about medications.    Subjective:  Medication Management: Patient's daughter states that she was hoping for [facility] clinical pharmacy services to assist once her mother is discharged from the SNF. She is worried about medication changes occurring during the transition from SNF to home. She is not sure when her mother will be discharged from the SNF.   [name] stated she did have a question regarding hier mother's medications. She noticed the patient was put back on metformin. States that her mother used to take metformin and is worried that she is back on it, especially since she has been taking Januvia. Asked about this regimen specifically concerning side effects and interactions.   Assessment/Plan: 1. Counseled [name] on the cardioprotective benefits of metformin, it's risks, dose reduction in kidney disease, and drug interactions.  2. Will request medication administration records from [facility] to further evaluate medication regimen.  3. Will contact other members of care management team to best determine follow up after patient is discharged from the SNF.   Ruben Reason, PharmD Clinical Pharmacist, [facility] [phone]

## 2017-07-28 ENCOUNTER — Encounter
Admission: RE | Admit: 2017-07-28 | Discharge: 2017-07-28 | Disposition: A | Discharge status: Home / Self Care | Payer: Medicare Other | Source: Ambulatory Visit | Attending: Internal Medicine | Admitting: Internal Medicine

## 2017-07-31 ENCOUNTER — Other Ambulatory Visit: Payer: Self-pay | Admitting: Pharmacist

## 2017-07-31 NOTE — Patient Outreach (Signed)
Abigail Boyd [facility]) Care Management  07/31/2017  ISSA LUSTER [birth year] [id number]   84 y.o. year old female referred to [place] for Medication Management (Initial pharmacy outreach) Referred by Marthenia Rolling, [facility] Liaison for medication review. Patient was hospitalized 07/21/17-07/25/17 for aphasia. Discharged to [facility]).   PMH includes but is not limited to: history of CVA with right sided motor deficit, CHF, afiv, diabetes mellitus, CKD (stage II), hypertension, OSA, recurrent UTI  Patient with [employer].   Spoke with patient's daughter, Loreli Slot (written consent on file). Patient's daughter confirms identity with HIPAA-identifiers x2 and gives verbal consent to speak over the phone about medications.    Subjective:  Medication Management: Following up from [name]'s medication query last week regarding the appropriate use of metformin for her mom given her decreased kidney function.   Requested MAR from [facility] but request was denied. Answered Ms. Ysidro Evert concerns about metformin to the best of my ability given information available in patient's chart.   Metfomin ER 570m once a day would be appropriate for patient with last known eGFR of 43 mL/min (07/24/17). Metformin is contraindicated for CrCL of <30 mL/min and dose reduced at CrCL <45 mL/min to 5084monce daily.   Cautioned Ms. HuYsidro Evertegarding the gastrointestinal side effects, particularly diarrhea, of metformin, especially with respect to the patient's history of recurrent UTI.   Assessment/Plan: 1. Counseled [name] on the cardioprotective benefits of metformin, it's risks, dose reduction in kidney disease,side effects, and drug interactions.  2. Will keep the pharmacy episode open for 10 days and then close. Will alert other care team members to please re-refer to pharmacy if needed.   JuRuben ReasonPharmD Clinical  Pharmacist, [facility] [phone]

## 2017-08-01 ENCOUNTER — Other Ambulatory Visit: Payer: Self-pay | Admitting: *Deleted

## 2017-08-01 NOTE — Patient Outreach (Signed)
[place] [facility]) Care Management  08/01/2017  Abigail Boyd [birth year] [id number]   Phone call to patient's daughter [name], [place]. [facility] cafre management program discussed, role of social worker clarified. Per patient's daughter, patient continues to receive rehab services at [facility]. The family care meeting took place yesterday, and patient was recommended for long term care. Patient's daughter feels that this determination is pre-mature as she is already seeing progress in patient. Per patient's daughter, she is active in therapy and her verbal skills are starting to improve.   Plan: This social worker will plan to visit patient on 08/02/17 and discuss progress and care goals with discharge planner.   [name] [facility] Care Management [phone]

## 2017-08-02 ENCOUNTER — Other Ambulatory Visit: Payer: Self-pay | Admitting: *Deleted

## 2017-08-02 NOTE — Patient Outreach (Signed)
[place] [facility]) Care Management  08/02/2017  EVAN OSBURN 1929-03-05 [id number]   Co-Post Acute Care visit to see patient at [facility] with Janan Ridge RNCM. Place. This Education officer, museum spoke with the discharge planner Geralyn Flash who states that the treatment team is recommending long term care for patient as patient is total care at this time. Per Joelene Millin, patient is incontinent, requires a hoyer lift to transfer from bed to chair, and only has the use of her left hand. Joelene Millin states that the family is not in agreement with the plan for long term care.  This social met with patient at bedside. Patient has expressive aphasia, patient's speech slow and deliberate. Patient did say that she did not feel that she was physical capable of going home and taking care of herself.   Phone call to patient's daughter [name], discussed recommendation for long term care. Per [name], she has an appointment to speak to an attorney regarding her assets as she does not think that she will qualify for Medicaid at this time. Per daughter,  safety is priority and she does not plan on discharging patient prematurely and will pay out of pocket if needed.   Plan: This Education officer, museum will continue to follow patient's progress while in the SNF.   [name] [facility] Care Management [phone]

## 2017-08-07 ENCOUNTER — Other Ambulatory Visit: Payer: Self-pay | Admitting: *Deleted

## 2017-08-07 ENCOUNTER — Non-Acute Institutional Stay (SKILLED_NURSING_FACILITY): Payer: Medicare Other | Admitting: Gerontology

## 2017-08-07 ENCOUNTER — Encounter: Payer: Self-pay | Admitting: Gerontology

## 2017-08-07 DIAGNOSIS — I480 Paroxysmal atrial fibrillation: Secondary | ICD-10-CM | POA: Diagnosis not present

## 2017-08-07 DIAGNOSIS — I639 Cerebral infarction, unspecified: Secondary | ICD-10-CM | POA: Diagnosis not present

## 2017-08-07 DIAGNOSIS — R531 Weakness: Secondary | ICD-10-CM

## 2017-08-07 NOTE — Patient Outreach (Signed)
Abigail Boyd) Care Management  08/07/2017  Abigail Boyd [birth year] [id number]   Post Acute care coordination visit. Met with discharge planner Cathie Beams who stated that patient has moved to the [place] floor and is still receiving rehab. This Education officer, museum discussed concern that patient may not qualify for Medicaid based on conversation with daughter who previously reported plan to  [facility] with a Elder Law attorney to discuss long term care options. Patient will be self pay if she does not qualify for Medicaid.   Plan: This Education officer, museum will confirm long term plan with patient;s daughter.      [name] [facility] Care Management [phone]

## 2017-08-07 NOTE — Progress Notes (Signed)
Location:   [facility] Room Number: [place] of Service:  SNF [phone]) Provider:  Toni Arthurs, NP-C  Caryl Bis Angela Adam, MD  Patient Care Team: Leone Haven, MD as PCP - General (Family Medicine) Vern Claude, LCSW as [place] Management Kary Kos, Blair Heys, [facility] as [place] Management (Pharmacist) Knox Royalty, RN as [place] Management  Extended Emergency Contact Information Primary Emergency Contact: [facility] Address: [address]          [place], [place] [address] [name] of [place] Phone: [phone] Work Phone: [phone] Mobile Phone: [phone] Relation: Daughter Secondary Emergency Contact: Marliss Czar,  [address] [name] of [place] Phone: [phone] Work Phone: [phone] Mobile Phone: [phone] Relation: Sister  Code Status:  FULL Goals of care: Advanced Directive information Advanced Directives 08/07/2017  Does Patient Have a Medical Advance Directive? No  Type of Advance Directive -  Does patient want to make changes to medical advance directive? -  Copy of [place] in Chart? -  Would patient like information on creating a medical advance directive? No - Patient declined  Pre-existing out of facility DNR order (yellow form or pink MOST form) -     Chief Complaint  Patient presents with  . Medical Management of Chronic Issues    Routine Visit    HPI:  Pt is a 84 y.o. female seen today for medical management of chronic diseases. Pt was admitted to the facility for rehab following hospitalization at [facility] for symptoms of acute CVA and Afib. Pt has generalized weakness and aphasia, but otherwise no new hemiplegia. Pt has been participating in PT/OT. Pt reports her pain is well controlled on current regimen. HR well controlled. Pt reports appetite is good, voiding well and having regular  BMs. VSS. No other complaints.      Past Medical History:  Diagnosis Date  . Abnormal nuclear stress test 06/21/2011   Inferolateral reversible defect;--> cardiac cath & PCI of CxOM, occluded RCA with collaterals;; followup Myoview 11/2012: Low risk. Fixed basal inferior artifact normal EF. No ischemia  . Anxiety    When necessary Xanax  . Anxiety   . Asthma   . Bilateral cataracts    Status post stroke or correction  . CAD S/P percutaneous coronary angioplasty 06/2011   s/P PCI to proximal OM1 w/ DES; occluded RCA with bridging and L-R collaterals (medical management)  . Chronic kidney disease (CKD), stage II (mild)    Related to current bladder infections (although diabetes cannot be excluded)  . Chronotropic incompetence with sinus node dysfunction [facility]) October 2013   On CPET test; beta blockers reduced  . Diabetes mellitus type 2, controlled ([place])    On oral medications  . Diverticulitis   . Essential hypertension    Allowing for permissive hypertension to avoid orthostatic hypotension  . GERD (gastroesophageal reflux disease)    On PPI  . Glaucoma   . History of syncope    Per EP - neurocardiogenic & not Bradycardia related (no PPM)  . History of unstable angina 06/13/2011   Jaw pain awakening from sleep -- Myoview --CATH --> PCI  . Hyperlipidemia with target LDL less than 70    HDL at goal, LDL not at goal, borderline triglycerides. On Crestor 20 mg  . Migraines   . OSA (obstructive sleep apnea)    hx bladder infections  .  Osteoarthritis   . PAF (paroxysmal atrial fibrillation) ([place]) 10-11/ 2014   CardioNet Event Monitor: NSR & S Brady -- Rates 50-100; Total A. fib burden 35 hours and 27 minutes. 1296 episodes, longest was 1 hour 29 minutes. Rate ranged from 52-169 beats per minute.  . Seasonal allergies   . Shortness of breath on exertion October 2013   2-D echo: Normal EF>55%, Gr 1DD, mild aortic sclerosis; Evaluated with CPET - peak VO2 97%; Chronotropic  Incompetence (submaximal effort)  . Spinal stenosis of lumbar region 11/2011  . Stroke ([place])   . Tachycardia-bradycardia syndrome ([place]) 11/2012  . Urge incontinence    Past Surgical History:  Procedure Laterality Date  . APPENDECTOMY    . BREAST BIOPSY     both breast  . cataracts     both eyes  . COLONOSCOPY    . CORONARY ANGIOPLASTY WITH STENT PLACEMENT  07/05/2011   Promus Element DES 2.5 mm x 16 mm-post dilated to 2.65 mm CX-Proximal OM1  . Holter Monitor  04/2015   Sinus rhythm with rates 52-149 BPM. Isolated PACs with rare couplets and bigeminy. Multiple short runs of PAT/PSVT. Arrhythmia run 120 bpm for 204 beats. 14% of time was in A. fib/flutter. - Reviewed by Dr. Caryl Comes. Not thought to be significant enough for her syncope.  Marland Kitchen KNEE ARTHROSCOPY WITH MEDIAL MENISECTOMY Right 06/24/2014   Procedure: RIGHT KNEE ARTHROSCOPY WITH partial lateral MENISECTOMY, abrasion chondroplasty of medial femoral condyle and patella, microfracture technique;  Surgeon: Latanya Maudlin, MD;  Location: [facility];  Service: Orthopedics;  Laterality: Right;  . LEFT HEART CATHETERIZATION WITH CORONARY ANGIOGRAM N/A 07/06/2011   Procedure: LEFT HEART CATHETERIZATION WITH CORONARY ANGIOGRAM;  Surgeon: Leonie Man, MD;  Location: [facility] CATH LAB: Unstable Angina --> Myoview wiht Inf-Lat Ischemia.  Proximal OM1 lesion --> PCI; 100% mid RCA with right to right and left to right bridging collaterals from circumflex RPL and LAD the PDA.  Marland Kitchen NM MYOVIEW LTD  October 2014    Low risk. Fixed basal inferior artifact normal EF. No ischemia --> (as compared to pre-PCI Myoview revealing inferolateral ischemia)  . rotator cuff Right   . TRANSTHORACIC ECHOCARDIOGRAM  11/2014   EF 65-70%, Mod LVH. Normal wall motion. Gr 1 DD. Normal valves.  Marland Kitchen VAGINAL HYSTERECTOMY      Allergies  Allergen Reactions  . Amlodipine Cough  . Clopidogrel Bisulfate Cough  . Kenalog [Triamcinolone Acetonide] Other (See Comments)    unknown  .  Lisinopril Cough  . Losartan Cough  . Sulfa Antibiotics Hives and Rash    Allergies as of 08/07/2017      Reactions   Amlodipine Cough   Clopidogrel Bisulfate Cough   Kenalog [triamcinolone Acetonide] Other (See Comments)   unknown   Lisinopril Cough   Losartan Cough   Sulfa Antibiotics Hives, Rash      Medication List        Accurate as of 08/07/17  3:56 PM. Always use your most recent med list.          acetaminophen 325 MG tablet Commonly known as:  TYLENOL Take 650 mg by mouth every 4 (four) hours as needed. for pain/ increased temp. May be administered orally, per G-tube if needed or rectally if unable to swallow (separate order). Maximum dose for 24 hours is 3,000 mg from all sources of Acetaminophen/ Tylenol   ALPRAZolam 0.25 MG tablet Commonly known as:  XANAX TAKE 1 TABLET BY MOUTH AT BEDTIME AS NEEDED SLEEP   atorvastatin  80 MG tablet Commonly known as:  LIPITOR Take 1 tablet (80 mg total) by mouth daily at 6 PM.   azelastine 0.1 % nasal spray Commonly known as:  ASTELIN Place 1-2 sprays into both nostrils at bedtime. Use in each nostril as directed   estradiol 0.1 MG/GM vaginal cream Commonly known as:  ESTRACE VAGINAL Apply 0.5mg  (pea-sized amount)  just inside the vaginal introitus with a finger-tip on Monday, Wednesday and Friday nights.   fish oil-omega-3 fatty acids 1000 MG capsule Take 1 g by mouth daily.   furosemide 20 MG tablet Commonly known as:  LASIX Take 20 mg by mouth daily.   gabapentin 300 MG capsule Commonly known as:  NEURONTIN Take 300 mg by mouth 3 (three) times daily.   HUMALOG KWIKPEN 100 UNIT/ML KiwkPen Generic drug:  insulin lispro Inject 22 Units into the skin 3 (three) times daily with meals.   levocetirizine 5 MG tablet Commonly known as:  XYZAL Take 2.5 mg by mouth daily.   LUMIGAN 0.01 % Soln Generic drug:  bimatoprost Place 1 drop into both eyes at bedtime.   Magnesium 500 MG Caps Take 500 mg by mouth at  bedtime.   metFORMIN 500 MG 24 hr tablet Commonly known as:  GLUCOPHAGE-XR Take 500 mg by mouth daily with breakfast.   metoprolol tartrate 25 MG tablet Commonly known as:  LOPRESSOR Take 1 tablet (25 mg total) by mouth 2 (two) times daily.   MYRBETRIQ 50 MG Tb24 tablet Generic drug:  mirabegron ER Take 50 mg by mouth daily.   nitroGLYCERIN 0.4 MG SL tablet Commonly known as:  NITROSTAT Place 0.4 mg under the tongue every 5 (five) minutes as needed for chest pain.   nystatin cream Commonly known as:  MYCOSTATIN Apply 1 application topically daily.   pantoprazole 40 MG tablet Commonly known as:  PROTONIX Take 1 tablet (40 mg total) by mouth daily.   PARoxetine 20 MG tablet Commonly known as:  PAXIL Take 1 tablet (20 mg total) by mouth daily.   polyethylene glycol packet Commonly known as:  MIRALAX / GLYCOLAX Take 17 g by mouth daily as needed for moderate constipation.   Rivaroxaban 15 MG Tabs tablet Commonly known as:  XARELTO Take 15 mg by mouth every evening.   rOPINIRole 0.5 MG tablet Commonly known as:  REQUIP TAKE 1 TABLET BY MOUTH AT  BEDTIME   STOOL SOFTENER 100 MG capsule Generic drug:  docusate sodium Take 100 mg by mouth at bedtime.   timolol 0.5 % ophthalmic solution Commonly known as:  TIMOPTIC Place 1 drop into both eyes at bedtime.   TOUJEO MAX SOLOSTAR 300 UNIT/ML Sopn Generic drug:  Insulin Glargine Inject 85 Units into the skin at bedtime.   traMADol 50 MG tablet Commonly known as:  ULTRAM Take 50 mg by mouth 2 (two) times daily.       Review of Systems  Constitutional: Negative for activity change, appetite change, chills, diaphoresis and fever.  HENT: Negative for congestion, mouth sores, nosebleeds, postnasal drip, sneezing, sore throat, trouble swallowing and voice change.   Respiratory: Negative for apnea, cough, choking, chest tightness, shortness of breath and wheezing.   Cardiovascular: Negative for chest pain, palpitations and  leg swelling.  Gastrointestinal: Negative for abdominal distention, abdominal pain, constipation, diarrhea and nausea.  Genitourinary: Negative for difficulty urinating, dysuria, frequency and urgency.  Musculoskeletal: Positive for arthralgias (typical arthritis) and gait problem. Negative for back pain and myalgias.  Skin: Negative for color change, pallor, rash  and wound.  Neurological: Positive for speech difficulty and weakness. Negative for dizziness, tremors, syncope, numbness and headaches.  Psychiatric/Behavioral: Negative for agitation and behavioral problems.  All other systems reviewed and are negative.   Immunization History  Administered Date(s) Administered  . HPV Bivalent 11/11/2016  . Influenza, High Dose Seasonal PF 10/31/2016  . Influenza,inj,Quad PF,6+ Mos 11/28/2012, 12/08/2014  . Influenza,inj,quad, With Preservative 11/27/2016  . Influenza-Unspecified 12/29/2014, 11/12/2015  . PPD Test 12/29/2014  . Pneumococcal Conjugate-13 02/27/2006, 10/31/2016  . Pneumococcal-Unspecified 12/29/2014, 10/28/2016  . Zoster 03/30/2006   Pertinent  Health Maintenance Due  Topic Date Due  . FOOT EXAM  10/08/1939  . URINE MICROALBUMIN  10/08/1939  . OPHTHALMOLOGY EXAM  08/16/2017  . INFLUENZA VACCINE  09/27/2017  . HEMOGLOBIN A1C  01/22/2018  . DEXA SCAN  Completed  . PNA vac Low Risk Adult  Completed   Fall Risk  02/23/2016 04/12/2015  Falls in the past year? Yes Yes  Number falls in past yr: 1 1  Injury with Fall? No No  Follow up Falls prevention discussed;Education provided -   Functional Status Survey:    Vitals:   08/07/17 1051  BP: (!) 113/49  Pulse: 64  Resp: 18  Temp: 98.2 F (36.8 C)  TempSrc: Oral  SpO2: 100%  Weight: 171 lb 14.4 oz (78 kg)  Height: 5\' 4"  (1.626 m)   Body mass index is 29.51 kg/m. Physical Exam  Constitutional: She is oriented to person, place, and time. Vital signs are normal. She appears well-developed and well-nourished. She is  active and cooperative. She does not appear ill. No distress.  HENT:  Head: Normocephalic and atraumatic.  Mouth/Throat: Uvula is midline, oropharynx is clear and moist and mucous membranes are normal. Mucous membranes are not pale, not dry and not cyanotic.  Eyes: Pupils are equal, round, and reactive to light. Conjunctivae, EOM and lids are normal.  Neck: Trachea normal, normal range of motion and full passive range of motion without pain. Neck supple. No JVD present. No tracheal deviation, no edema and no erythema present. No thyromegaly present.  Cardiovascular: Normal rate, regular rhythm, normal heart sounds, intact distal pulses and normal pulses. Exam reveals no gallop, no distant heart sounds and no friction rub.  No murmur heard. Pulses:      Dorsalis pedis pulses are 2+ on the right side, and 2+ on the left side.  No edema  Pulmonary/Chest: Effort normal and breath sounds normal. No accessory muscle usage. No respiratory distress. She has no decreased breath sounds. She has no wheezes. She has no rhonchi. She has no rales. She exhibits no tenderness.  Abdominal: Soft. Normal appearance and bowel sounds are normal. She exhibits no distension and no ascites. There is no tenderness.  Musculoskeletal: Normal range of motion. She exhibits no edema or tenderness.  Expected osteoarthritis, stiffness; Bilateral Calves soft, supple. Negative Homan's Sign. B- pedal pulses equal; generalized weakness  Neurological: She is alert and oriented to person, place, and time. She has normal strength. A cranial nerve deficit and sensory deficit is present. She exhibits abnormal muscle tone. Coordination and gait abnormal.  aphasia  Skin: Skin is warm, dry and intact. She is not diaphoretic. No cyanosis. No pallor. Nails show no clubbing.  Psychiatric: She has a normal mood and affect. Her speech is normal and behavior is normal. Judgment and thought content normal. Cognition and memory are normal.  Nursing  note and vitals reviewed.   Labs reviewed: Recent Labs    07/21/17  1743 07/21/17 1749 07/22/17 1526 07/24/17 0507  NA 136 139 138 144  K 4.2 4.2 4.6 4.1  CL 95* 94* 98* 109  CO2 28  --  24 25  GLUCOSE 221* 218* 242* 121*  BUN 18 20 14 18   CREATININE 1.21* 1.00 0.99 1.13*  CALCIUM 9.7  --  9.8 9.4   Recent Labs    12/12/16 1642 07/05/17 1229 07/21/17 1743  AST 15 20 23   ALT 17 19 25   ALKPHOS 64 67 85  BILITOT 0.3 0.6 0.4  PROT 7.6 7.9 7.6  ALBUMIN 4.3 4.2 3.6   Recent Labs    04/23/17 1514  07/21/17 1743 07/21/17 1749 07/22/17 0026 07/22/17 1526 07/24/17 0507  WBC 10.0   < > 10.1  --   --  12.3* 10.5  NEUTROABS 8.1*  --  7.6  --   --   --   --   HGB 12.8   < > 13.8 14.6  --  15.0 12.9  HCT 38.7   < > 43.3 43.0 36.2 46.4* 39.5  MCV 94.5   < > 94.5  --   --  94.5 93.8  PLT 287   < > 323  --   --  339 331   < > = values in this interval not displayed.   Lab Results  Component Value Date   TSH 4.194 07/22/2017   Lab Results  Component Value Date   HGBA1C 7.8 (H) 07/22/2017   Lab Results  Component Value Date   CHOL 234 (H) 07/24/2017   HDL 33 (L) 07/24/2017   LDLCALC 157 (H) 07/24/2017   LDLDIRECT 106.0 05/18/2015   TRIG 218 (H) 07/24/2017   CHOLHDL 7.1 07/24/2017    Significant Diagnostic Results in last 30 days:  Ct Angio Head W Or Wo Contrast  Result Date: 07/21/2017 CLINICAL DATA:  New onset right-sided weakness. EXAM: CT ANGIOGRAPHY HEAD AND NECK TECHNIQUE: Multidetector CT imaging of the head and neck was performed using the standard protocol during bolus administration of intravenous contrast. Multiplanar CT image reconstructions and MIPs were obtained to evaluate the vascular anatomy. Carotid stenosis measurements (when applicable) are obtained utilizing NASCET criteria, using the distal internal carotid diameter as the denominator. CONTRAST:  63mL ISOVUE-370 IOPAMIDOL (ISOVUE-370) INJECTION 76% COMPARISON:  CT head without contrast 07/21/2017  and 01/20/2017. MRI brain and MRA head 05/18/2015. FINDINGS: CTA NECK FINDINGS Aortic arch: A 3 vessel arch configuration is present. Atherosclerotic calcifications are present origin of the left subclavian artery without a significant stenosis. Right carotid system: The right common carotid artery is within normal limits. Bifurcation is unremarkable. There is tortuosity of the cervical right ICA. Irregularity is present in the mid cervical right ICA without significant stenosis. Left carotid system: The left common carotid artery is within normal limits. The bifurcation unremarkable. There is focal irregularity of the mid left cervical ICA. No significant luminal stenosis is present. Overall caliber of the left internal carotid artery is smaller than the right. Vertebral arteries: The right vertebral artery is slightly dominant to the left. Both vertebral arteries originate from the subclavian arteries without significant stenosis. There is no significant stenosis or focal vessel irregularity within either vertebral artery in the neck. Skeleton: Endplate degenerative changes are evident from C3-4 through C6-7. Osseous foraminal narrowing is greatest on the left at C5-6 and on the right at C4-5 and C6-7. Other neck: Multiple hypodense thyroid nodules are present. These measure less than 10 mm. There is no dominant lesion. No  significant cervical adenopathy is present. Salivary glands are within normal limits. No focal mucosal or submucosal lesions are evident. Vocal cords are midline and symmetric. Upper chest: Scattered ground-glass attenuation is present in the lung is bilaterally. No focal airspace consolidation is present. There is no nodule or mass lesion. Review of the MIP images confirms the above findings CTA HEAD FINDINGS Anterior circulation: No atherosclerotic calcifications are present within the cavernous internal carotid arteries bilaterally without significant stenosis. The ICA termini are within  normal limits bilaterally. The A1 and M1 segments are normal. The anterior communicating artery is present. MCA bifurcations are within normal limits. The proximal MCA branch vessels are within normal limits bilaterally. There is some distal attenuation bilaterally. No significant proximal stenosis or occlusion is present. There is moderate focal stenosis within the mid left A2 segment. There is some attenuation of distal ACA branch vessels bilaterally. Posterior circulation: The right vertebral artery is slightly dominant. PICA origins are visualized and normal. The basilar artery is normal. Both posterior cerebral arteries originate from basilar tip. A moderate stenosis is present in the right P2 segment. There is asymmetric attenuation of PCA branch vessels on the right. No significant proximal branch vessel occlusion is present. Venous sinuses: The dural sinuses are patent. The straight sinus and deep cerebral veins are intact. Cortical veins are unremarkable. Anatomic variants: None Review of the MIP images confirms the above findings IMPRESSION: 1. Atherosclerotic changes at the origin of the left subclavian artery without significant stenosis in the neck. 2. Vessel irregularity in the mid cervical segments bilaterally, left more prominent than right. Findings are consistent with fibromuscular dysplasia. 3. Atherosclerotic changes within the cavernous internal carotid arteries bilaterally without a significant stenosis. 4. No significant proximal stenosis, aneurysm, or branch vessel occlusion within the circle-of-Willis. 5. Moderate left A2/A3 segment stenosis. 6. Mild multilevel degenerative changes of the cervical spine. 7. Ground-glass attenuation the lung apices bilaterally likely reflects edema or atelectasis. Electronically Signed   By: San Morelle M.D.   On: 07/21/2017 18:41   Dg Tibia/fibula Right  Result Date: 07/21/2017 CLINICAL DATA:  Right leg pain.  Patient is unresponsive. EXAM:  RIGHT TIBIA AND FIBULA - 2 VIEW COMPARISON:  None. FINDINGS: Right tibia and fibula appear intact. No evidence of acute fracture or dislocation. No focal bone lesion or bone destruction. Soft tissues are unremarkable. IMPRESSION: No acute bony abnormalities. Electronically Signed   By: Lucienne Capers M.D.   On: 07/21/2017 21:16   Ct Angio Neck W Or Wo Contrast  Result Date: 07/21/2017 CLINICAL DATA:  New onset right-sided weakness. EXAM: CT ANGIOGRAPHY HEAD AND NECK TECHNIQUE: Multidetector CT imaging of the head and neck was performed using the standard protocol during bolus administration of intravenous contrast. Multiplanar CT image reconstructions and MIPs were obtained to evaluate the vascular anatomy. Carotid stenosis measurements (when applicable) are obtained utilizing NASCET criteria, using the distal internal carotid diameter as the denominator. CONTRAST:  19mL ISOVUE-370 IOPAMIDOL (ISOVUE-370) INJECTION 76% COMPARISON:  CT head without contrast 07/21/2017 and 01/20/2017. MRI brain and MRA head 05/18/2015. FINDINGS: CTA NECK FINDINGS Aortic arch: A 3 vessel arch configuration is present. Atherosclerotic calcifications are present origin of the left subclavian artery without a significant stenosis. Right carotid system: The right common carotid artery is within normal limits. Bifurcation is unremarkable. There is tortuosity of the cervical right ICA. Irregularity is present in the mid cervical right ICA without significant stenosis. Left carotid system: The left common carotid artery is within normal limits. The bifurcation  unremarkable. There is focal irregularity of the mid left cervical ICA. No significant luminal stenosis is present. Overall caliber of the left internal carotid artery is smaller than the right. Vertebral arteries: The right vertebral artery is slightly dominant to the left. Both vertebral arteries originate from the subclavian arteries without significant stenosis. There is no  significant stenosis or focal vessel irregularity within either vertebral artery in the neck. Skeleton: Endplate degenerative changes are evident from C3-4 through C6-7. Osseous foraminal narrowing is greatest on the left at C5-6 and on the right at C4-5 and C6-7. Other neck: Multiple hypodense thyroid nodules are present. These measure less than 10 mm. There is no dominant lesion. No significant cervical adenopathy is present. Salivary glands are within normal limits. No focal mucosal or submucosal lesions are evident. Vocal cords are midline and symmetric. Upper chest: Scattered ground-glass attenuation is present in the lung is bilaterally. No focal airspace consolidation is present. There is no nodule or mass lesion. Review of the MIP images confirms the above findings CTA HEAD FINDINGS Anterior circulation: No atherosclerotic calcifications are present within the cavernous internal carotid arteries bilaterally without significant stenosis. The ICA termini are within normal limits bilaterally. The A1 and M1 segments are normal. The anterior communicating artery is present. MCA bifurcations are within normal limits. The proximal MCA branch vessels are within normal limits bilaterally. There is some distal attenuation bilaterally. No significant proximal stenosis or occlusion is present. There is moderate focal stenosis within the mid left A2 segment. There is some attenuation of distal ACA branch vessels bilaterally. Posterior circulation: The right vertebral artery is slightly dominant. PICA origins are visualized and normal. The basilar artery is normal. Both posterior cerebral arteries originate from basilar tip. A moderate stenosis is present in the right P2 segment. There is asymmetric attenuation of PCA branch vessels on the right. No significant proximal branch vessel occlusion is present. Venous sinuses: The dural sinuses are patent. The straight sinus and deep cerebral veins are intact. Cortical veins  are unremarkable. Anatomic variants: None Review of the MIP images confirms the above findings IMPRESSION: 1. Atherosclerotic changes at the origin of the left subclavian artery without significant stenosis in the neck. 2. Vessel irregularity in the mid cervical segments bilaterally, left more prominent than right. Findings are consistent with fibromuscular dysplasia. 3. Atherosclerotic changes within the cavernous internal carotid arteries bilaterally without a significant stenosis. 4. No significant proximal stenosis, aneurysm, or branch vessel occlusion within the circle-of-Willis. 5. Moderate left A2/A3 segment stenosis. 6. Mild multilevel degenerative changes of the cervical spine. 7. Ground-glass attenuation the lung apices bilaterally likely reflects edema or atelectasis. Electronically Signed   By: San Morelle M.D.   On: 07/21/2017 18:41   Mr Brain Wo Contrast  Result Date: 07/22/2017 CLINICAL DATA:  Acute somnolence and speech difficulty. Chronic RIGHT-sided motor deficits. EXAM: MRI HEAD WITHOUT CONTRAST TECHNIQUE: Multiplanar, multiecho pulse sequences of the brain and surrounding structures were obtained without intravenous contrast. COMPARISON:  CT head and CTA head neck 07/21/2017. FINDINGS: Brain: Restricted diffusion affects the LEFT lentiform nucleus, and periventricular white matter, consistent with an acute LEFT MCA lenticulostriate infarct. No hemorrhage, mass lesion, or extra-axial fluid. Hydrocephalus ex vacuo. Atrophy with small vessel disease. Chronic lacunar infarcts affect the BILATERAL thalamus and BILATERAL basal ganglia. Vascular: Flow voids are maintained. Skull and upper cervical spine: Normal marrow signal. Pannus surrounds C1-C2. Sinuses/Orbits: No layering sinus fluid.  Negative orbits. Other: None. IMPRESSION: Acute LEFT MCA lenticulostriate territory infarct, nonhemorrhagic. Involvement of  the LEFT lentiform nucleus and periventricular white matter. Chronic changes as  described. Electronically Signed   By: Staci Righter M.D.   On: 07/22/2017 19:30   Dg Knee Complete 4 Views Right  Result Date: 07/21/2017 CLINICAL DATA:  Right leg pain.  Patient is unresponsive. EXAM: RIGHT KNEE - COMPLETE 4+ VIEW COMPARISON:  None. FINDINGS: Mild degenerative changes in the right knee with medial compartment narrowing. No evidence of acute fracture or dislocation. No focal bone lesion or bone destruction. Bone cortex appears intact. No significant effusion. Soft tissues are unremarkable. IMPRESSION: Mild degenerative changes in the right knee. No acute bony abnormalities. Electronically Signed   By: Lucienne Capers M.D.   On: 07/21/2017 21:16   Dg Swallowing Func-speech Pathology  Result Date: 07/23/2017 Objective Swallowing Evaluation: Type of Study: MBS-Modified Barium Swallow Study  Patient Details Name: ANURADHA CHABOT MRN: [id number] Date of Birth: 1929/05/04 Today's Date: 07/23/2017 Time: SLP Start Time (ACUTE ONLY): 0935 -SLP Stop Time (ACUTE ONLY): 1004 SLP Time Calculation (min) (ACUTE ONLY): 29 min Past Medical History: Past Medical History: Diagnosis Date . Abnormal nuclear stress test 06/21/2011  Inferolateral reversible defect;--> cardiac cath & PCI of CxOM, occluded RCA with collaterals;; followup Myoview 11/2012: Low risk. Fixed basal inferior artifact normal EF. No ischemia . Anxiety   When necessary Xanax . Anxiety  . Asthma  . Bilateral cataracts   Status post stroke or correction . CAD S/P percutaneous coronary angioplasty 06/2011  s/P PCI to proximal OM1 w/ DES; occluded RCA with bridging and L-R collaterals (medical management) . Chronic kidney disease (CKD), stage II (mild)   Related to current bladder infections (although diabetes cannot be excluded) . Chronotropic incompetence with sinus node dysfunction [facility]) October 2013  On CPET test; beta blockers reduced . Diabetes mellitus type 2, controlled ([place])   On oral medications . Diverticulitis  . Essential hypertension    Allowing for permissive hypertension to avoid orthostatic hypotension . GERD (gastroesophageal reflux disease)   On PPI . Glaucoma  . History of syncope   Per EP - neurocardiogenic & not Bradycardia related (no PPM) . History of unstable angina 06/13/2011  Jaw pain awakening from sleep -- Myoview --CATH --> PCI . Hyperlipidemia with target LDL less than 70   HDL at goal, LDL not at goal, borderline triglycerides. On Crestor 20 mg . Migraines  . OSA (obstructive sleep apnea)   hx bladder infections . Osteoarthritis  . PAF (paroxysmal atrial fibrillation) ([place]) 10-11/ 2014  CardioNet Event Monitor: NSR & S Brady -- Rates 50-100; Total A. fib burden 35 hours and 27 minutes. 1296 episodes, longest was 1 hour 29 minutes. Rate ranged from 52-169 beats per minute. . Seasonal allergies  . Shortness of breath on exertion October 2013  2-D echo: Normal EF>55%, Gr 1DD, mild aortic sclerosis; Evaluated with CPET - peak VO2 97%; Chronotropic Incompetence (submaximal effort) . Spinal stenosis of lumbar region 11/2011 . Stroke ([place])  . Tachycardia-bradycardia syndrome ([place]) 11/2012 . Urge incontinence  Past Surgical History: Past Surgical History: Procedure Laterality Date . APPENDECTOMY   . BREAST BIOPSY    both breast . cataracts    both eyes . COLONOSCOPY   . CORONARY ANGIOPLASTY WITH STENT PLACEMENT  07/05/2011  Promus Element DES 2.5 mm x 16 mm-post dilated to 2.65 mm CX-Proximal OM1 . Holter Monitor  04/2015  Sinus rhythm with rates 52-149 BPM. Isolated PACs with rare couplets and bigeminy. Multiple short runs of PAT/PSVT. Arrhythmia run 120 bpm for 204 beats. 14%  of time was in A. fib/flutter. - Reviewed by Dr. Caryl Comes. Not thought to be significant enough for her syncope. Marland Kitchen KNEE ARTHROSCOPY WITH MEDIAL MENISECTOMY Right 06/24/2014  Procedure: RIGHT KNEE ARTHROSCOPY WITH partial lateral MENISECTOMY, abrasion chondroplasty of medial femoral condyle and patella, microfracture technique;  Surgeon: Latanya Maudlin, MD;  Location: [facility]  ORS;  Service: Orthopedics;  Laterality: Right; . LEFT HEART CATHETERIZATION WITH CORONARY ANGIOGRAM N/A 07/06/2011  Procedure: LEFT HEART CATHETERIZATION WITH CORONARY ANGIOGRAM;  Surgeon: Leonie Man, MD;  [facility] CATH LAB: Unstable Angina --> Myoview wiht Inf-Lat Ischemia.  Proximal OM1 lesion --> PCI; 100% mid RCA with right to right and left to right bridging collaterals from circumflex RPL and LAD the PDA. Marland Kitchen NM MYOVIEW LTD  October 2014   Low risk. Fixed basal inferior artifact normal EF. No ischemia --> (as compared to pre-PCI Myoview revealing inferolateral ischemia) . rotator cuff Right  . TRANSTHORACIC ECHOCARDIOGRAM  11/2014  EF 65-70%, Mod LVH. Normal wall motion. Gr 1 DD. Normal valves. Marland Kitchen VAGINAL HYSTERECTOMY   HPI: Abigail Boyd is an 84 y.o. female who has been in short term rehab for reconditioning and IV abx for UTI due to baseline debility post stroke in 2016. Baseline mRS 4, as she needs help with all ADLs, uses a walker or w/c currently per facility. The pt was given Xanex after her therapy on the afternoon of 5/25.  When pts daughter arrived for visit, she was not talking or responding normally and the daughter called EMS. Due to residual rt side weakness and facial droop, EMS called a pre-arrival code stroke. Upon arrival she was unable to answer questions, lethargic and dysarthric speech. She had a right side facial droop and not using right side as briskly. CTH shows sevral old bilateral infarcts. No acute infarcts seen nor LVO. Swallow evaluation ordered.  Subjective: MRI showed L MCA infarct Assessment / Plan / Recommendation CHL IP CLINICAL IMPRESSIONS 07/23/2017 Clinical Impression Patient presents with moderate oropharyngeal dysphagia due to sensory and motor deficits; there is sensed aspiration of thin liquids and straw sips of nectar. Oral stage characterized by impaired mastication, decreased bolus cohesion, delayed oral transit. There is mild-moderate lingual residue with  solids, as well as mild oral holding with liquids, right anterior spillage with thin liquids due to decreased labial seal. Swallow initiation delayed to valleculae for most consistencies, but pyriform sinuses with thin liquids and straw sips of nectar. There is decreased/sluggish hyolarygneal excursion with delayed closure of laryngeal vestibule leading to penetration and aspiration before the swallow thin and straw sips of nectar. Pt did elicit a cough response which cleared remaining penetration but not aspiration. Chin tuck did not prevent deep penetration/questionable trace aspiration of thin liquids. No penetration/aspiration with cup sips of nectar. No abnormal residue remained in the pharynx after the swallow, with the exception of barium tablet which was retained in the valleculae and cleared with reflexive subsequent swallow. Cervical esophageal phase was unremarkable. Limited esophageal sweep performed due to positioning constraints. Recommend initiating dys 2, nectar thick liquids, NO STRAWS, meds whole with nectar thick liquid. Pt would benefit from interventions to address strengthening of hyolaryngeal complex, cough. Will continue to follow. MD, please order SLP cognitive-linguistic evaluation.  SLP Visit Diagnosis Dysphagia, oropharyngeal phase (R13.12) Attention and concentration deficit following -- Frontal lobe and executive function deficit following -- Impact on safety and function Mild aspiration risk;Moderate aspiration risk   CHL IP TREATMENT RECOMMENDATION 07/23/2017 Treatment Recommendations Therapy as outlined in treatment plan  below;F/U MBS in --- days (Comment)   Prognosis 07/23/2017 Prognosis for Safe Diet Advancement Good Barriers to Reach Goals Language deficits Barriers/Prognosis Comment -- CHL IP DIET RECOMMENDATION 07/23/2017 SLP Diet Recommendations Dysphagia 2 (Fine chop) solids;Nectar thick liquid Liquid Administration via Cup;No straw Medication Administration Whole meds with  liquid Compensations Slow rate;Small sips/bites Postural Changes Seated upright at 90 degrees   CHL IP OTHER RECOMMENDATIONS 07/23/2017 Recommended Consults Other (Comment) Oral Care Recommendations Oral care BID Other Recommendations Remove water pitcher;Have oral suction available   CHL IP FOLLOW UP RECOMMENDATIONS 07/23/2017 Follow up Recommendations Other (comment)   CHL IP FREQUENCY AND DURATION 07/23/2017 Speech Therapy Frequency (ACUTE ONLY) min 2x/week Treatment Duration 2 weeks      CHL IP ORAL PHASE 07/23/2017 Oral Phase Impaired Oral - Pudding Teaspoon -- Oral - Pudding Cup -- Oral - Honey Teaspoon WFL Oral - Honey Cup -- Oral - Nectar Teaspoon Holding of bolus Oral - Nectar Cup Holding of bolus Oral - Nectar Straw Holding of bolus Oral - Thin Teaspoon Right anterior bolus loss;Holding of bolus;Decreased bolus cohesion Oral - Thin Cup Right anterior bolus loss;Decreased bolus cohesion Oral - Thin Straw -- Oral - Puree Decreased bolus cohesion;Delayed oral transit Oral - Mech Soft -- Oral - Regular Impaired mastication;Reduced posterior propulsion;Delayed oral transit;Decreased bolus cohesion Oral - Multi-Consistency -- Oral - Pill Delayed oral transit;Decreased bolus cohesion Oral Phase - Comment --  CHL IP PHARYNGEAL PHASE 07/23/2017 Pharyngeal Phase Impaired Pharyngeal- Pudding Teaspoon -- Pharyngeal -- Pharyngeal- Pudding Cup -- Pharyngeal -- Pharyngeal- Honey Teaspoon Delayed swallow initiation-vallecula;Reduced anterior laryngeal mobility;Reduced laryngeal elevation Pharyngeal -- Pharyngeal- Honey Cup -- Pharyngeal -- Pharyngeal- Nectar Teaspoon Delayed swallow initiation-vallecula;Reduced anterior laryngeal mobility;Reduced laryngeal elevation Pharyngeal -- Pharyngeal- Nectar Cup Delayed swallow initiation-vallecula;Reduced laryngeal elevation;Reduced anterior laryngeal mobility Pharyngeal -- Pharyngeal- Nectar Straw Delayed swallow initiation-pyriform sinuses;Reduced anterior laryngeal mobility;Reduced  laryngeal elevation;Reduced airway/laryngeal closure;Penetration/Aspiration before swallow;Trace aspiration Pharyngeal Material enters airway, passes BELOW cords and not ejected out despite cough attempt by patient Pharyngeal- Thin Teaspoon Delayed swallow initiation-vallecula;Reduced laryngeal elevation;Reduced anterior laryngeal mobility Pharyngeal -- Pharyngeal- Thin Cup Delayed swallow initiation-pyriform sinuses;Reduced anterior laryngeal mobility;Reduced laryngeal elevation;Reduced airway/laryngeal closure;Penetration/Aspiration before swallow;Moderate aspiration Pharyngeal Material enters airway, passes BELOW cords and not ejected out despite cough attempt by patient Pharyngeal- Thin Straw -- Pharyngeal -- Pharyngeal- Puree Delayed swallow initiation-vallecula;Reduced epiglottic inversion;Reduced anterior laryngeal mobility Pharyngeal -- Pharyngeal- Mechanical Soft -- Pharyngeal -- Pharyngeal- Regular Delayed swallow initiation-vallecula;Reduced anterior laryngeal mobility;Reduced laryngeal elevation Pharyngeal -- Pharyngeal- Multi-consistency -- Pharyngeal -- Pharyngeal- Pill Delayed swallow initiation-vallecula;Pharyngeal residue - valleculae;Reduced anterior laryngeal mobility;Reduced laryngeal elevation Pharyngeal -- Pharyngeal Comment --  CHL IP CERVICAL ESOPHAGEAL PHASE 07/23/2017 Cervical Esophageal Phase WFL Pudding Teaspoon -- Pudding Cup -- Honey Teaspoon -- Honey Cup -- Nectar Teaspoon -- Nectar Cup -- Nectar Straw -- Thin Teaspoon -- Thin Cup -- Thin Straw -- Puree -- Mechanical Soft -- Regular -- Multi-consistency -- Pill -- Cervical Esophageal Comment -- No flowsheet data found. Abigail Boyd 07/23/2017, 11:08 AM  Abigail Lever, MS, CCC-SLP Speech-Language Pathologist [phone]             Dg Hip Unilat W Or Wo Pelvis 2-3 Views Right  Result Date: 07/21/2017 CLINICAL DATA:  Right leg pain.  Patient is unresponsive. EXAM: DG HIP (WITH OR WITHOUT PELVIS) 2-3V RIGHT COMPARISON:  None. FINDINGS:  Severe degenerative changes in the right hip with loss of superior acetabular joint space and bone-on-bone appearance. There is remodeling and sclerosis of the right femoral head. This  may indicate severe degenerative changes or avascular necrosis. No evidence of acute fracture or dislocation of the pelvis or right hip. SI joints and symphysis pubis are not displaced. Residual contrast material in the bladder. Degenerative changes in the lower lumbar spine and in the left hip. Vascular calcifications. IMPRESSION: Severe degenerative changes in the right hip. Remodeling and sclerosis of the right femoral head may indicate severe degenerative changes or avascular necrosis. No acute fracture or dislocation. Electronically Signed   By: Lucienne Capers M.D.   On: 07/21/2017 21:15   Ct Head Code Stroke Wo Contrast  Result Date: 07/21/2017 CLINICAL DATA:  Code stroke. New onset of right-sided weakness. The patient is on blood thinners. EXAM: CT HEAD WITHOUT CONTRAST TECHNIQUE: Contiguous axial images were obtained from the base of the skull through the vertex without intravenous contrast. COMPARISON:  CT head without contrast 01/20/2017. FINDINGS: Brain: Paraventricular white matter disease similar the prior exam. A remote lacunar infarct of the right internal capsule is stable. Remote lacunar infarcts are present in the thalami bilaterally. A remote lacunar infarct is present in the left caudate head. No new infarcts are present. The insular cortex is intact. The brainstem and cerebellum are normal. Vascular: Atherosclerotic calcifications are present within the cavernous internal carotid arteries bilaterally. There is no hyperdense vessel. Skull: Calvarium is intact. Sinuses/Orbits: A fluid level is present within the sphenoid sinuses. Paranasal sinuses are otherwise clear. The mastoid air cells are clear. ASPECTS ([place] Stroke Program Early CT Score) - Ganglionic level infarction (caudate, lentiform nuclei,  internal capsule, insula, M1-M3 cortex): 7/7 - Supraganglionic infarction (M4-M6 cortex): 3/3 Total score (0-10 with 10 being normal): 10/10 IMPRESSION: 1. Stable appearance of remote lacunar infarcts involving the basal ganglia bilaterally and the right internal capsule. 2. No acute intracranial abnormality. 3. Stable atrophy and white matter disease. This likely reflects the sequela of chronic microvascular ischemia. 4. Sphenoid sinus disease. 5. ASPECTS is 10/10 The above was relayed via text pager to Dr. Lorraine Lax on 07/21/2017 at 17:59 . Electronically Signed   By: San Morelle M.D.   On: 07/21/2017 18:00    Assessment/Plan  Acute CVA (cerebrovascular accident) ([place])  PAF (paroxysmal atrial fibrillation) (HCC);  CHA2DS2-VASc Score =6 on 15 mg Xarelto  Generalized weakness   Continue PT/OT  Continue exercises as taught by PT/OT  Continue Xarelto 15 mg po Q Day  Continue Tramadol 50 mg po BID  Continue Metoprolol 25 mg po BID  Continue Lipitor 80 mg po Q Day  Monitor for increased muscle pain/weakness  Mobile longer distances with wheelchair as needed  Safety precautions  Fall precautions  Family/ staff Communication:   Total Time:  Documentation:  Face to Face:  Family/Phone:   Labs/tests ordered:    Medication list reviewed and assessed for continued appropriateness. Monthly medication orders reviewed and signed.  Vikki Ports, NP-C Geriatrics [facility] [phone], [place] [address] Cell Phone (Mon-Fri 8am-5pm):  [phone] On Call:  [phone] & follow prompts after 5pm & weekends Office Phone:  [phone] Office Fax:  [phone]

## 2017-08-07 NOTE — Patient Outreach (Signed)
[place] [facility]) Care Management  08/07/2017  Abigail Boyd 1929/07/12 [id number]    Phone call to patient's daughter who confirms that she is aware of patient's transfer to the long term care floor. Patient's daughter plans to  Meet with a Elder law attorney tomorrow to discuss private pay options with regards to patient's assets.  Plan: This Education officer, museum will continue to follow patient's progress in rehab.   [name] [facility] Care Management [phone]

## 2017-08-09 ENCOUNTER — Ambulatory Visit: Payer: Medicare Other | Admitting: Urology

## 2017-08-09 ENCOUNTER — Other Ambulatory Visit: Payer: Self-pay | Admitting: Pharmacist

## 2017-08-09 NOTE — Patient Outreach (Signed)
[place] [facility]) Care Management  08/09/2017  Abigail Boyd 12/27/1929 [id number]   Patient was DC'ed from [facility] to skilled nursing facility [facility]) on 07/25/2017. Will refer to [place] [facility] for continued follow-up and assistance with transition of care needs upon discharge. Per review of patient's chart, it seems she will be moving to long term care at [facility].   Plan: Will close pharmacy episode. Will get new referral from [place] if patient discharges home.   Ruben Reason, PharmD Clinical Pharmacist, [facility] [phone]

## 2017-08-13 ENCOUNTER — Ambulatory Visit: Payer: Self-pay | Admitting: Pharmacist

## 2017-08-21 ENCOUNTER — Other Ambulatory Visit: Payer: Self-pay | Admitting: *Deleted

## 2017-08-21 NOTE — Patient Outreach (Signed)
[place] [facility]) Care Management  08/21/2017  Abigail Boyd December 22, 1929 [id number]   Phone call to Geralyn Flash, discharge planner at [facility] to confirm patient's long term plan. Voicemail message left requesting a return call.    [name] [facility] Care Management [phone]

## 2017-08-23 ENCOUNTER — Ambulatory Visit: Payer: Medicare Other | Admitting: Adult Health

## 2017-08-23 ENCOUNTER — Other Ambulatory Visit: Payer: Self-pay | Admitting: *Deleted

## 2017-08-23 NOTE — Patient Outreach (Signed)
[facility]) Care Management  08/23/2017  KELSAY HAGGARD 02-Mar-1929 [id number]   Phone call form Long Term social worker at [facility] who reports that patient's last covered day will be 08/25/17. It will be at that time that she will transition to long term care.  Plan: Patient will be closed to [facility] care management as patient will be remaining at [facility] on a longterm basis.   [name] [facility] Care Management [phone]

## 2017-08-27 ENCOUNTER — Other Ambulatory Visit: Payer: Self-pay | Admitting: *Deleted

## 2017-08-27 ENCOUNTER — Encounter
Admission: RE | Admit: 2017-08-27 | Discharge: 2017-08-27 | Disposition: A | Discharge status: Home / Self Care | Payer: Medicare Other | Source: Ambulatory Visit | Attending: Internal Medicine | Admitting: Internal Medicine

## 2017-08-27 DIAGNOSIS — I69320 Aphasia following cerebral infarction: Secondary | ICD-10-CM | POA: Diagnosis not present

## 2017-08-27 DIAGNOSIS — R1312 Dysphagia, oropharyngeal phase: Secondary | ICD-10-CM | POA: Diagnosis not present

## 2017-08-27 DIAGNOSIS — I130 Hypertensive heart and chronic kidney disease with heart failure and stage 1 through stage 4 chronic kidney disease, or unspecified chronic kidney disease: Secondary | ICD-10-CM | POA: Diagnosis not present

## 2017-08-27 DIAGNOSIS — E1122 Type 2 diabetes mellitus with diabetic chronic kidney disease: Secondary | ICD-10-CM | POA: Diagnosis not present

## 2017-08-27 DIAGNOSIS — I69351 Hemiplegia and hemiparesis following cerebral infarction affecting right dominant side: Secondary | ICD-10-CM | POA: Diagnosis not present

## 2017-08-27 NOTE — Patient Outreach (Signed)
[facility] [facility]) Care Management [facility] CM Case Closure note 08/27/2017  Abigail Boyd 1929/09/10 [id number]  [facility] CM Case Closure re: Abigail Boyd, 84 y/o female previously referred to [facility] after hospitalization May 25-29, 2019 for acute CVA/ encephalopathy; patient was discharged from hospital to SNF for rehabilitation.    Received notification from [facility] CSW that patient has transitioned from short-term SNF placement for rehabilitation to long-term SNF placement.  Plan:  [facility] CM case closure   Oneta Rack, RN, BSN, [place] Coordinator [facility] Care Management  [phone]

## 2017-08-30 DIAGNOSIS — I69351 Hemiplegia and hemiparesis following cerebral infarction affecting right dominant side: Secondary | ICD-10-CM | POA: Diagnosis not present

## 2017-08-30 DIAGNOSIS — E1122 Type 2 diabetes mellitus with diabetic chronic kidney disease: Secondary | ICD-10-CM | POA: Diagnosis not present

## 2017-08-30 DIAGNOSIS — R1312 Dysphagia, oropharyngeal phase: Secondary | ICD-10-CM | POA: Diagnosis not present

## 2017-08-30 DIAGNOSIS — I69320 Aphasia following cerebral infarction: Secondary | ICD-10-CM | POA: Diagnosis not present

## 2017-08-30 DIAGNOSIS — I130 Hypertensive heart and chronic kidney disease with heart failure and stage 1 through stage 4 chronic kidney disease, or unspecified chronic kidney disease: Secondary | ICD-10-CM | POA: Diagnosis not present

## 2017-08-31 DIAGNOSIS — I69351 Hemiplegia and hemiparesis following cerebral infarction affecting right dominant side: Secondary | ICD-10-CM | POA: Diagnosis not present

## 2017-08-31 DIAGNOSIS — I130 Hypertensive heart and chronic kidney disease with heart failure and stage 1 through stage 4 chronic kidney disease, or unspecified chronic kidney disease: Secondary | ICD-10-CM | POA: Diagnosis not present

## 2017-08-31 DIAGNOSIS — R1312 Dysphagia, oropharyngeal phase: Secondary | ICD-10-CM | POA: Diagnosis not present

## 2017-08-31 DIAGNOSIS — E1122 Type 2 diabetes mellitus with diabetic chronic kidney disease: Secondary | ICD-10-CM | POA: Diagnosis not present

## 2017-08-31 DIAGNOSIS — I69320 Aphasia following cerebral infarction: Secondary | ICD-10-CM | POA: Diagnosis not present

## 2017-09-02 DIAGNOSIS — I69320 Aphasia following cerebral infarction: Secondary | ICD-10-CM | POA: Diagnosis not present

## 2017-09-02 DIAGNOSIS — I130 Hypertensive heart and chronic kidney disease with heart failure and stage 1 through stage 4 chronic kidney disease, or unspecified chronic kidney disease: Secondary | ICD-10-CM | POA: Diagnosis not present

## 2017-09-02 DIAGNOSIS — I69351 Hemiplegia and hemiparesis following cerebral infarction affecting right dominant side: Secondary | ICD-10-CM | POA: Diagnosis not present

## 2017-09-02 DIAGNOSIS — R1312 Dysphagia, oropharyngeal phase: Secondary | ICD-10-CM | POA: Diagnosis not present

## 2017-09-02 DIAGNOSIS — E1122 Type 2 diabetes mellitus with diabetic chronic kidney disease: Secondary | ICD-10-CM | POA: Diagnosis not present

## 2017-09-03 ENCOUNTER — Encounter: Payer: Self-pay | Admitting: Adult Health

## 2017-09-03 ENCOUNTER — Non-Acute Institutional Stay (SKILLED_NURSING_FACILITY): Payer: Medicare Other | Admitting: Adult Health

## 2017-09-03 DIAGNOSIS — K219 Gastro-esophageal reflux disease without esophagitis: Secondary | ICD-10-CM

## 2017-09-03 DIAGNOSIS — E1122 Type 2 diabetes mellitus with diabetic chronic kidney disease: Secondary | ICD-10-CM

## 2017-09-03 DIAGNOSIS — I480 Paroxysmal atrial fibrillation: Secondary | ICD-10-CM | POA: Diagnosis not present

## 2017-09-03 DIAGNOSIS — J309 Allergic rhinitis, unspecified: Secondary | ICD-10-CM

## 2017-09-03 DIAGNOSIS — E785 Hyperlipidemia, unspecified: Secondary | ICD-10-CM

## 2017-09-03 DIAGNOSIS — N8501 Benign endometrial hyperplasia: Secondary | ICD-10-CM | POA: Diagnosis not present

## 2017-09-03 DIAGNOSIS — I639 Cerebral infarction, unspecified: Secondary | ICD-10-CM | POA: Diagnosis not present

## 2017-09-03 DIAGNOSIS — N3941 Urge incontinence: Secondary | ICD-10-CM

## 2017-09-03 DIAGNOSIS — I130 Hypertensive heart and chronic kidney disease with heart failure and stage 1 through stage 4 chronic kidney disease, or unspecified chronic kidney disease: Secondary | ICD-10-CM

## 2017-09-03 DIAGNOSIS — E1169 Type 2 diabetes mellitus with other specified complication: Secondary | ICD-10-CM

## 2017-09-03 DIAGNOSIS — F419 Anxiety disorder, unspecified: Secondary | ICD-10-CM

## 2017-09-03 DIAGNOSIS — N182 Chronic kidney disease, stage 2 (mild): Secondary | ICD-10-CM

## 2017-09-03 DIAGNOSIS — E1149 Type 2 diabetes mellitus with other diabetic neurological complication: Secondary | ICD-10-CM | POA: Diagnosis not present

## 2017-09-03 DIAGNOSIS — I69320 Aphasia following cerebral infarction: Secondary | ICD-10-CM | POA: Diagnosis not present

## 2017-09-03 DIAGNOSIS — H401130 Primary open-angle glaucoma, bilateral, stage unspecified: Secondary | ICD-10-CM

## 2017-09-03 DIAGNOSIS — R1312 Dysphagia, oropharyngeal phase: Secondary | ICD-10-CM | POA: Diagnosis not present

## 2017-09-03 DIAGNOSIS — I69351 Hemiplegia and hemiparesis following cerebral infarction affecting right dominant side: Secondary | ICD-10-CM | POA: Diagnosis not present

## 2017-09-03 NOTE — Progress Notes (Signed)
Location:   [facility] Room Number: 307A Place of Service:  SNF (31)   CODE STATUS: FULL  Allergies  Allergen Reactions  . Amlodipine Cough  . Clopidogrel Bisulfate Cough  . Kenalog [Triamcinolone Acetonide] Other (See Comments)    unknown  . Lisinopril Cough  . Losartan Cough  . Sulfa Antibiotics Hives and Rash    Chief Complaint  Patient presents with  . Medical Management of Chronic Issues    Hypertensive heart disease; PAF: dyslipidemia     HPI:  She is a 84 year old long term resident of this facility being seen for the management of her chronic illnesses: hypertensive heart disease; PAF: dyslipidemia. She is unable to fully participate in the hpi or ros. There are no reports of chest pain; changes in appetite. She does have bruising to both arms.   Past Medical History:  Diagnosis Date  . Abnormal nuclear stress test 06/21/2011   Inferolateral reversible defect;--> cardiac cath & PCI of CxOM, occluded RCA with collaterals;; followup Myoview 11/2012: Low risk. Fixed basal inferior artifact normal EF. No ischemia  . Anxiety    When necessary Xanax  . Anxiety   . Asthma   . Bilateral cataracts    Status post stroke or correction  . CAD S/P percutaneous coronary angioplasty 06/2011   s/P PCI to proximal OM1 w/ DES; occluded RCA with bridging and L-R collaterals (medical management)  . Chronic kidney disease (CKD), stage II (mild)    Related to current bladder infections (although diabetes cannot be excluded)  . Chronotropic incompetence with sinus node dysfunction [facility]) October 2013   On CPET test; beta blockers reduced  . Diabetes mellitus type 2, controlled ([place])    On oral medications  . Diverticulitis   . Essential hypertension    Allowing for permissive hypertension to avoid orthostatic hypotension  . GERD (gastroesophageal reflux disease)    On PPI  . Glaucoma   . History of syncope    Per EP - neurocardiogenic & not  Bradycardia related (no PPM)  . History of unstable angina 06/13/2011   Jaw pain awakening from sleep -- Myoview --CATH --> PCI  . Hyperlipidemia with target LDL less than 70    HDL at goal, LDL not at goal, borderline triglycerides. On Crestor 20 mg  . Migraines   . OSA (obstructive sleep apnea)    hx bladder infections  . Osteoarthritis   . PAF (paroxysmal atrial fibrillation) ([place]) 10-11/ 2014   CardioNet Event Monitor: NSR & S Brady -- Rates 50-100; Total A. fib burden 35 hours and 27 minutes. 1296 episodes, longest was 1 hour 29 minutes. Rate ranged from 52-169 beats per minute.  . Seasonal allergies   . Shortness of breath on exertion October 2013   2-D echo: Normal EF>55%, Gr 1DD, mild aortic sclerosis; Evaluated with CPET - peak VO2 97%; Chronotropic Incompetence (submaximal effort)  . Spinal stenosis of lumbar region 11/2011  . Stroke ([place])   . Tachycardia-bradycardia syndrome ([place]) 11/2012  . Urge incontinence     Past Surgical History:  Procedure Laterality Date  . APPENDECTOMY    . BREAST BIOPSY     both breast  . cataracts     both eyes  . COLONOSCOPY    . CORONARY ANGIOPLASTY WITH STENT PLACEMENT  07/05/2011   Promus Element DES 2.5 mm x 16 mm-post dilated to 2.65 mm CX-Proximal OM1  . Holter Monitor  04/2015   Sinus rhythm with rates 52-149  BPM. Isolated PACs with rare couplets and bigeminy. Multiple short runs of PAT/PSVT. Arrhythmia run 120 bpm for 204 beats. 14% of time was in A. fib/flutter. - Reviewed by Dr. Caryl Comes. Not thought to be significant enough for her syncope.  Marland Kitchen KNEE ARTHROSCOPY WITH MEDIAL MENISECTOMY Right 06/24/2014   Procedure: RIGHT KNEE ARTHROSCOPY WITH partial lateral MENISECTOMY, abrasion chondroplasty of medial femoral condyle and patella, microfracture technique;  Surgeon: Latanya Maudlin, MD;  Location: [facility]S;  Service: Orthopedics;  Laterality: Right;  . LEFT HEART CATHETERIZATION WITH CORONARY ANGIOGRAM N/A 07/06/2011   Procedure: LEFT HEART  CATHETERIZATION WITH CORONARY ANGIOGRAM;  Surgeon: Leonie Man, MD;  Location: [facility] CATH LAB: Unstable Angina --> Myoview wiht Inf-Lat Ischemia.  Proximal OM1 lesion --> PCI; 100% mid RCA with right to right and left to right bridging collaterals from circumflex RPL and LAD the PDA.  Marland Kitchen NM MYOVIEW LTD  October 2014    Low risk. Fixed basal inferior artifact normal EF. No ischemia --> (as compared to pre-PCI Myoview revealing inferolateral ischemia)  . rotator cuff Right   . TRANSTHORACIC ECHOCARDIOGRAM  11/2014   EF 65-70%, Mod LVH. Normal wall motion. Gr 1 DD. Normal valves.  Marland Kitchen VAGINAL HYSTERECTOMY      Social History   Socioeconomic History  . Marital status: Widowed    Spouse name: Not on file  . Number of children: Not on file  . Years of education: Not on file  . Highest education level: Not on file  Occupational History  . Occupation: retired  Scientific laboratory technician  . Financial resource strain: Not hard at all  . Food insecurity:    Worry: Patient refused    Inability: Patient refused  . Transportation needs:    Medical: Patient refused    Non-medical: Patient refused  Tobacco Use  . Smoking status: Never Smoker  . Smokeless tobacco: Never Used  Substance and Sexual Activity  . Alcohol use: No  . Drug use: No  . Sexual activity: Never    Birth control/protection: Post-menopausal  Lifestyle  . Physical activity:    Days per week: Patient refused    Minutes per session: Patient refused  . Stress: Only a little  Relationships  . Social connections:    Talks on phone: Patient refused    Gets together: Patient refused    Attends religious service: Patient refused    Active member of club or organization: Patient refused    Attends meetings of clubs or organizations: Patient refused    Relationship status: Patient refused  . Intimate partner violence:    Fear of current or ex partner: Patient refused    Emotionally abused: Patient refused    Physically abused: Patient  refused    Forced sexual activity: Patient refused  Other Topics Concern  . Not on file  Social History Narrative   Widowed mother of one, grandmother 2. Has not been using silver take as much lately due to knee discomfort. Usually exercises with Silver Sneakers on regular basis.   Family History  Problem Relation Age of Onset  . COPD Mother   . Pulmonary fibrosis Mother   . Heart attack Father   . Heart disease Father   . Stroke Father   . Cancer Sister   . Cancer Sister   . Clotting disorder Sister   . Heart attack Sister   . Arthritis Unknown   . Breast cancer Sister   . Colon cancer Neg Hx   . Esophageal cancer Neg Hx   .  Stomach cancer Neg Hx       VITAL SIGNS BP 134/65   Pulse 68   Temp 98.3 F (36.8 C) (Oral)   Resp 16   Ht 5\' 4"  (1.626 m)   Wt 171 lb 14.4 oz (78 kg)   SpO2 95%   BMI 29.51 kg/m   Outpatient Encounter Medications as of 09/03/2017  Medication Sig  . acetaminophen (TYLENOL) 325 MG tablet Take 650 mg by mouth every 4 (four) hours as needed. for pain/ increased temp. May be administered orally, per G-tube if needed or rectally if unable to swallow (separate order). Maximum dose for 24 hours is 3,000 mg from all sources of Acetaminophen/ Tylenol  . ALPRAZolam (XANAX) 0.25 MG tablet Take 0.25 mg by mouth at bedtime as needed for sleep.  Marland Kitchen atorvastatin (LIPITOR) 80 MG tablet Take 1 tablet (80 mg total) by mouth daily at 6 PM.  . azelastine (ASTELIN) 0.1 % nasal spray Place 1-2 sprays into both nostrils at bedtime. Use in each nostril as directed  . docusate sodium (STOOL SOFTENER) 100 MG capsule Take 100 mg by mouth at bedtime.   Marland Kitchen estradiol (ESTRACE VAGINAL) 0.1 MG/GM vaginal cream Apply 0.5mg  (pea-sized amount)  just inside the vaginal introitus with a finger-tip on Monday, Wednesday and Friday nights.  . fish oil-omega-3 fatty acids 1000 MG capsule Take 1 g by mouth daily.   . furosemide (LASIX) 20 MG tablet Take 20 mg by mouth daily.  Marland Kitchen gabapentin  (NEURONTIN) 300 MG capsule Take 300 mg by mouth 3 (three) times daily.   Marland Kitchen glucagon (GLUCAGON EMERGENCY) 1 MG injection Inject 1 mg into the vein once as needed. Give 1 mg IM for Hypoglycemia (symptomatic) not responding to oral glucose, snack. May repeat x 1 after 15 minutes. USE ONLY IF PT IS OR IS BECOMING UNRESPONSIVE  . HUMALOG KWIKPEN 100 UNIT/ML KiwkPen Inject 22 Units into the skin 3 (three) times daily with meals.   . Infant Care Products [facility] EX) Apply liberal amount topically to area of skin irritation prn. OK to leave at bedside.  . Insulin Glargine (TOUJEO MAX SOLOSTAR) 300 UNIT/ML SOPN Inject 75 Units into the skin at bedtime.   Marland Kitchen levocetirizine (XYZAL) 5 MG tablet Take 2.5 mg by mouth daily.   Marland Kitchen LUMIGAN 0.01 % SOLN Place 1 drop into both eyes at bedtime.   . Magnesium 500 MG CAPS Take 500 mg by mouth at bedtime.   . metFORMIN (GLUCOPHAGE-XR) 500 MG 24 hr tablet Take 500 mg by mouth daily with breakfast.   . metoprolol tartrate (LOPRESSOR) 25 MG tablet Take 1 tablet (25 mg total) by mouth 2 (two) times daily.  . mirabegron ER (MYRBETRIQ) 50 MG TB24 tablet Take 50 mg by mouth daily.   . nitroGLYCERIN (NITROSTAT) 0.4 MG SL tablet Place 0.4 mg under the tongue every 5 (five) minutes as needed for chest pain.  Marland Kitchen nystatin cream (MYCOSTATIN) Apply 1 application topically daily.  . pantoprazole (PROTONIX) 40 MG tablet Take 1 tablet (40 mg total) by mouth daily.  Marland Kitchen PARoxetine (PAXIL) 20 MG tablet Take 1 tablet (20 mg total) by mouth daily.  . polyethylene glycol (MIRALAX / GLYCOLAX) packet Take 17 g by mouth daily as needed for moderate constipation.  . Rivaroxaban (XARELTO) 15 MG TABS tablet Take 15 mg by mouth every evening.   Marland Kitchen rOPINIRole (REQUIP) 0.5 MG tablet TAKE 1 TABLET BY MOUTH AT  BEDTIME  . timolol (TIMOPTIC) 0.5 % ophthalmic solution Place 1 drop into  both eyes at bedtime.   . traMADol (ULTRAM) 50 MG tablet Take 50 mg by mouth 3 (three) times daily.   . [DISCONTINUED]  ALPRAZolam (XANAX) 0.25 MG tablet TAKE 1 TABLET BY MOUTH AT BEDTIME AS NEEDED SLEEP   No facility-administered encounter medications on file as of 09/03/2017.      SIGNIFICANT DIAGNOSTIC EXAMS   LABS REVIEWED:   07-21-17: wbc 10.1; hgb 13.8; hct 43.8; mcv 94.5; plt 323 glucose 221; bun 18; creat 1.21; k+ 4.2; na++ 136; ca 9.7 liver normal albumin 3.6  07-22-17: hgb a1c 7.8 tsh 4.194 vit B 12: 342 07-24-17: wbc 10.5; hgb 12.9; hct 39.5; mcv 93.8 plt 331; glucose 121 bun 18; creat 1.13;  4.1; na++ 144; ca 9.4 chol 234; ldl 157; trig 218; hdl 33    Review of Systems  Unable to perform ROS: Other (poor historian )    Physical Exam  Constitutional: She appears well-developed and well-nourished. No distress.  Neck: Thyromegaly present.  Mild thyroid enlargement present.   Cardiovascular: Normal rate, regular rhythm, normal heart sounds and intact distal pulses.  Pulmonary/Chest: Effort normal and breath sounds normal. No respiratory distress.  Abdominal: Soft. Bowel sounds are normal. She exhibits no distension. There is no tenderness.  Musculoskeletal: She exhibits no edema.  Has generalized weakness present.  Is able to move all extremities   Lymphadenopathy:    She has no cervical adenopathy.  Neurological: She is alert.  Skin: Skin is warm and dry. She is not diaphoretic.  Has significant bruising on right forearm which is starting to resolve Has less bruising on left arm.   Psychiatric: She has a normal mood and affect.      ASSESSMENT/ PLAN:  TODAY:   1. Hypertensive heart disease with congestive heart failure and stage 2 kidney disease: is stable b/p 134/65: will continue lopressor 25 mg twice daily   2. PAF( paroxymal atrial fibrillation) CHA2DS2- VASc score+6: heart rate is stable; will continue lopressor 25 mg twice daily for rate control and xarelto 15 mg daily   3.  Chronic diastolic heart failure: is stable will continue lasix 20 mg daily   4. CAD-PCI OM1, 100%  RCA (l-R collaterals): is stable will continue lopressor 25 mg twice daily has prn ntg is on statin  5. Dyslipidemia associated with type 2 diabetes mellitus: stable LDL 157 trig 218 : will continue lipitor 80 mg daily and fish oil 1 gm daily   6.  Diabetes mellitus type 2 with neurological manifestations: is stable hgb a1c 7.8: will continue humalog 22 units with meals and toujeo 75 units nightly metformin xr 500 mg daily   7. Acute CVA: is neurologically stable will continue xarelto 15 mg daily   8. Diabetic neuropathy: is stable will continue neurontin 300 mg three times daily ultram 50 mg three times daily   9.  GERD without esophagitis: stable will continue protonic 40 mg daily   10.  Chronic constipation: is stable will continue colace nightly and miralax daily as needed  11.  Allergic rhinitis: is stable will continue astelin nasal spray nightly and xyzal 2.5 mg daily   12. Benign endometrial hyperplasia: is stable: will continue estrace vaginal cream three times weekly   13. UI: without change: will continue myrbetriq 50 mg daily   14.  Bilateral chronic glaucoma: is stable will continue timoptic to both eyes and lumigan to both eyes.   15. PLMD (periodic limb movement disorder) is stable will continue requip 0.5 mg nightly  16. Anxiety: is stable will continue xanax 0.25 mg nightly as needed   17. CKD stage 2 due to type 2 diabetes mellitus: is stable bun 18; creat 1.13       MD is aware of resident's narcotic use and is in agreement with current plan of care. We will attempt to wean resident as apropriate   Ok Edwards NP [facility]  Contact [phone] Monday through Friday 8am- 5pm  After hours call [phone]

## 2017-09-04 DIAGNOSIS — I130 Hypertensive heart and chronic kidney disease with heart failure and stage 1 through stage 4 chronic kidney disease, or unspecified chronic kidney disease: Secondary | ICD-10-CM | POA: Diagnosis not present

## 2017-09-04 DIAGNOSIS — I69320 Aphasia following cerebral infarction: Secondary | ICD-10-CM | POA: Diagnosis not present

## 2017-09-04 DIAGNOSIS — I69351 Hemiplegia and hemiparesis following cerebral infarction affecting right dominant side: Secondary | ICD-10-CM | POA: Diagnosis not present

## 2017-09-04 DIAGNOSIS — E1122 Type 2 diabetes mellitus with diabetic chronic kidney disease: Secondary | ICD-10-CM | POA: Diagnosis not present

## 2017-09-04 DIAGNOSIS — R1312 Dysphagia, oropharyngeal phase: Secondary | ICD-10-CM | POA: Diagnosis not present

## 2017-09-05 DIAGNOSIS — I69351 Hemiplegia and hemiparesis following cerebral infarction affecting right dominant side: Secondary | ICD-10-CM | POA: Diagnosis not present

## 2017-09-05 DIAGNOSIS — I69320 Aphasia following cerebral infarction: Secondary | ICD-10-CM | POA: Diagnosis not present

## 2017-09-05 DIAGNOSIS — R1312 Dysphagia, oropharyngeal phase: Secondary | ICD-10-CM | POA: Diagnosis not present

## 2017-09-05 DIAGNOSIS — E1122 Type 2 diabetes mellitus with diabetic chronic kidney disease: Secondary | ICD-10-CM | POA: Diagnosis not present

## 2017-09-05 DIAGNOSIS — I130 Hypertensive heart and chronic kidney disease with heart failure and stage 1 through stage 4 chronic kidney disease, or unspecified chronic kidney disease: Secondary | ICD-10-CM | POA: Diagnosis not present

## 2017-09-06 DIAGNOSIS — E1140 Type 2 diabetes mellitus with diabetic neuropathy, unspecified: Secondary | ICD-10-CM | POA: Insufficient documentation

## 2017-09-06 DIAGNOSIS — I130 Hypertensive heart and chronic kidney disease with heart failure and stage 1 through stage 4 chronic kidney disease, or unspecified chronic kidney disease: Secondary | ICD-10-CM | POA: Diagnosis not present

## 2017-09-06 DIAGNOSIS — H401190 Primary open-angle glaucoma, unspecified eye, stage unspecified: Secondary | ICD-10-CM | POA: Insufficient documentation

## 2017-09-06 DIAGNOSIS — N182 Chronic kidney disease, stage 2 (mild): Secondary | ICD-10-CM

## 2017-09-06 DIAGNOSIS — E1122 Type 2 diabetes mellitus with diabetic chronic kidney disease: Secondary | ICD-10-CM | POA: Diagnosis not present

## 2017-09-06 DIAGNOSIS — I69320 Aphasia following cerebral infarction: Secondary | ICD-10-CM | POA: Diagnosis not present

## 2017-09-06 DIAGNOSIS — E1149 Type 2 diabetes mellitus with other diabetic neurological complication: Secondary | ICD-10-CM | POA: Insufficient documentation

## 2017-09-06 DIAGNOSIS — R32 Unspecified urinary incontinence: Secondary | ICD-10-CM | POA: Insufficient documentation

## 2017-09-06 DIAGNOSIS — R1312 Dysphagia, oropharyngeal phase: Secondary | ICD-10-CM | POA: Diagnosis not present

## 2017-09-06 DIAGNOSIS — I69351 Hemiplegia and hemiparesis following cerebral infarction affecting right dominant side: Secondary | ICD-10-CM | POA: Diagnosis not present

## 2017-09-10 ENCOUNTER — Other Ambulatory Visit: Payer: Self-pay

## 2017-09-10 DIAGNOSIS — I130 Hypertensive heart and chronic kidney disease with heart failure and stage 1 through stage 4 chronic kidney disease, or unspecified chronic kidney disease: Secondary | ICD-10-CM | POA: Diagnosis not present

## 2017-09-10 DIAGNOSIS — E1122 Type 2 diabetes mellitus with diabetic chronic kidney disease: Secondary | ICD-10-CM | POA: Diagnosis not present

## 2017-09-10 DIAGNOSIS — R1312 Dysphagia, oropharyngeal phase: Secondary | ICD-10-CM | POA: Diagnosis not present

## 2017-09-10 DIAGNOSIS — I69320 Aphasia following cerebral infarction: Secondary | ICD-10-CM | POA: Diagnosis not present

## 2017-09-10 DIAGNOSIS — I69351 Hemiplegia and hemiparesis following cerebral infarction affecting right dominant side: Secondary | ICD-10-CM | POA: Diagnosis not present

## 2017-09-10 MED ORDER — TRAMADOL HCL 50 MG PO TABS: 50.0000 mg | ORAL_TABLET | ORAL | 0 refills | Status: DC | PRN

## 2017-09-10 MED ORDER — TRAMADOL HCL 50 MG PO TABS: 50.0000 mg | ORAL_TABLET | Freq: Three times a day (TID) | ORAL | 0 refills | Status: DC

## 2017-09-10 NOTE — Telephone Encounter (Signed)
Rx sent to [facility] phone : [phone] , fax : [phone]  

## 2017-09-11 DIAGNOSIS — I69351 Hemiplegia and hemiparesis following cerebral infarction affecting right dominant side: Secondary | ICD-10-CM | POA: Diagnosis not present

## 2017-09-11 DIAGNOSIS — E1122 Type 2 diabetes mellitus with diabetic chronic kidney disease: Secondary | ICD-10-CM | POA: Diagnosis not present

## 2017-09-11 DIAGNOSIS — I130 Hypertensive heart and chronic kidney disease with heart failure and stage 1 through stage 4 chronic kidney disease, or unspecified chronic kidney disease: Secondary | ICD-10-CM | POA: Diagnosis not present

## 2017-09-11 DIAGNOSIS — R1312 Dysphagia, oropharyngeal phase: Secondary | ICD-10-CM | POA: Diagnosis not present

## 2017-09-11 DIAGNOSIS — I69320 Aphasia following cerebral infarction: Secondary | ICD-10-CM | POA: Diagnosis not present

## 2017-09-12 DIAGNOSIS — I130 Hypertensive heart and chronic kidney disease with heart failure and stage 1 through stage 4 chronic kidney disease, or unspecified chronic kidney disease: Secondary | ICD-10-CM | POA: Diagnosis not present

## 2017-09-12 DIAGNOSIS — E1122 Type 2 diabetes mellitus with diabetic chronic kidney disease: Secondary | ICD-10-CM | POA: Diagnosis not present

## 2017-09-12 DIAGNOSIS — R1312 Dysphagia, oropharyngeal phase: Secondary | ICD-10-CM | POA: Diagnosis not present

## 2017-09-12 DIAGNOSIS — I69320 Aphasia following cerebral infarction: Secondary | ICD-10-CM | POA: Diagnosis not present

## 2017-09-12 DIAGNOSIS — I69351 Hemiplegia and hemiparesis following cerebral infarction affecting right dominant side: Secondary | ICD-10-CM | POA: Diagnosis not present

## 2017-09-13 DIAGNOSIS — E1122 Type 2 diabetes mellitus with diabetic chronic kidney disease: Secondary | ICD-10-CM | POA: Diagnosis not present

## 2017-09-13 DIAGNOSIS — I69351 Hemiplegia and hemiparesis following cerebral infarction affecting right dominant side: Secondary | ICD-10-CM | POA: Diagnosis not present

## 2017-09-13 DIAGNOSIS — I130 Hypertensive heart and chronic kidney disease with heart failure and stage 1 through stage 4 chronic kidney disease, or unspecified chronic kidney disease: Secondary | ICD-10-CM | POA: Diagnosis not present

## 2017-09-13 DIAGNOSIS — R1312 Dysphagia, oropharyngeal phase: Secondary | ICD-10-CM | POA: Diagnosis not present

## 2017-09-13 DIAGNOSIS — I69320 Aphasia following cerebral infarction: Secondary | ICD-10-CM | POA: Diagnosis not present

## 2017-09-14 ENCOUNTER — Other Ambulatory Visit: Payer: Self-pay | Admitting: Internal Medicine

## 2017-09-14 DIAGNOSIS — R1312 Dysphagia, oropharyngeal phase: Secondary | ICD-10-CM

## 2017-09-14 DIAGNOSIS — I69351 Hemiplegia and hemiparesis following cerebral infarction affecting right dominant side: Secondary | ICD-10-CM | POA: Diagnosis not present

## 2017-09-14 DIAGNOSIS — E1122 Type 2 diabetes mellitus with diabetic chronic kidney disease: Secondary | ICD-10-CM | POA: Diagnosis not present

## 2017-09-14 DIAGNOSIS — I130 Hypertensive heart and chronic kidney disease with heart failure and stage 1 through stage 4 chronic kidney disease, or unspecified chronic kidney disease: Secondary | ICD-10-CM | POA: Diagnosis not present

## 2017-09-14 DIAGNOSIS — I69320 Aphasia following cerebral infarction: Secondary | ICD-10-CM | POA: Diagnosis not present

## 2017-09-15 DIAGNOSIS — I25119 Atherosclerotic heart disease of native coronary artery with unspecified angina pectoris: Secondary | ICD-10-CM | POA: Diagnosis not present

## 2017-09-15 DIAGNOSIS — Z0000 Encounter for general adult medical examination without abnormal findings: Secondary | ICD-10-CM | POA: Diagnosis not present

## 2017-09-15 DIAGNOSIS — I4891 Unspecified atrial fibrillation: Secondary | ICD-10-CM | POA: Diagnosis not present

## 2017-09-15 DIAGNOSIS — I69320 Aphasia following cerebral infarction: Secondary | ICD-10-CM | POA: Diagnosis not present

## 2017-09-15 DIAGNOSIS — I10 Essential (primary) hypertension: Secondary | ICD-10-CM | POA: Diagnosis not present

## 2017-09-17 DIAGNOSIS — R1312 Dysphagia, oropharyngeal phase: Secondary | ICD-10-CM | POA: Diagnosis not present

## 2017-09-17 DIAGNOSIS — I130 Hypertensive heart and chronic kidney disease with heart failure and stage 1 through stage 4 chronic kidney disease, or unspecified chronic kidney disease: Secondary | ICD-10-CM | POA: Diagnosis not present

## 2017-09-17 DIAGNOSIS — I69320 Aphasia following cerebral infarction: Secondary | ICD-10-CM | POA: Diagnosis not present

## 2017-09-17 DIAGNOSIS — E1122 Type 2 diabetes mellitus with diabetic chronic kidney disease: Secondary | ICD-10-CM | POA: Diagnosis not present

## 2017-09-17 DIAGNOSIS — I69351 Hemiplegia and hemiparesis following cerebral infarction affecting right dominant side: Secondary | ICD-10-CM | POA: Diagnosis not present

## 2017-09-18 DIAGNOSIS — R1312 Dysphagia, oropharyngeal phase: Secondary | ICD-10-CM | POA: Diagnosis not present

## 2017-09-18 DIAGNOSIS — I130 Hypertensive heart and chronic kidney disease with heart failure and stage 1 through stage 4 chronic kidney disease, or unspecified chronic kidney disease: Secondary | ICD-10-CM | POA: Diagnosis not present

## 2017-09-18 DIAGNOSIS — I69320 Aphasia following cerebral infarction: Secondary | ICD-10-CM | POA: Diagnosis not present

## 2017-09-18 DIAGNOSIS — I69351 Hemiplegia and hemiparesis following cerebral infarction affecting right dominant side: Secondary | ICD-10-CM | POA: Diagnosis not present

## 2017-09-18 DIAGNOSIS — E1122 Type 2 diabetes mellitus with diabetic chronic kidney disease: Secondary | ICD-10-CM | POA: Diagnosis not present

## 2017-09-19 DIAGNOSIS — E1122 Type 2 diabetes mellitus with diabetic chronic kidney disease: Secondary | ICD-10-CM | POA: Diagnosis not present

## 2017-09-19 DIAGNOSIS — I69351 Hemiplegia and hemiparesis following cerebral infarction affecting right dominant side: Secondary | ICD-10-CM | POA: Diagnosis not present

## 2017-09-19 DIAGNOSIS — I69320 Aphasia following cerebral infarction: Secondary | ICD-10-CM | POA: Diagnosis not present

## 2017-09-19 DIAGNOSIS — R1312 Dysphagia, oropharyngeal phase: Secondary | ICD-10-CM | POA: Diagnosis not present

## 2017-09-19 DIAGNOSIS — I130 Hypertensive heart and chronic kidney disease with heart failure and stage 1 through stage 4 chronic kidney disease, or unspecified chronic kidney disease: Secondary | ICD-10-CM | POA: Diagnosis not present

## 2017-09-20 DIAGNOSIS — I69351 Hemiplegia and hemiparesis following cerebral infarction affecting right dominant side: Secondary | ICD-10-CM | POA: Diagnosis not present

## 2017-09-20 DIAGNOSIS — I69320 Aphasia following cerebral infarction: Secondary | ICD-10-CM | POA: Diagnosis not present

## 2017-09-20 DIAGNOSIS — R1312 Dysphagia, oropharyngeal phase: Secondary | ICD-10-CM | POA: Diagnosis not present

## 2017-09-20 DIAGNOSIS — I130 Hypertensive heart and chronic kidney disease with heart failure and stage 1 through stage 4 chronic kidney disease, or unspecified chronic kidney disease: Secondary | ICD-10-CM | POA: Diagnosis not present

## 2017-09-20 DIAGNOSIS — E1122 Type 2 diabetes mellitus with diabetic chronic kidney disease: Secondary | ICD-10-CM | POA: Diagnosis not present

## 2017-09-21 DIAGNOSIS — I69320 Aphasia following cerebral infarction: Secondary | ICD-10-CM | POA: Diagnosis not present

## 2017-09-21 DIAGNOSIS — I130 Hypertensive heart and chronic kidney disease with heart failure and stage 1 through stage 4 chronic kidney disease, or unspecified chronic kidney disease: Secondary | ICD-10-CM | POA: Diagnosis not present

## 2017-09-21 DIAGNOSIS — M1711 Unilateral primary osteoarthritis, right knee: Secondary | ICD-10-CM | POA: Diagnosis not present

## 2017-09-21 DIAGNOSIS — R1312 Dysphagia, oropharyngeal phase: Secondary | ICD-10-CM | POA: Diagnosis not present

## 2017-09-21 DIAGNOSIS — I69351 Hemiplegia and hemiparesis following cerebral infarction affecting right dominant side: Secondary | ICD-10-CM | POA: Diagnosis not present

## 2017-09-21 DIAGNOSIS — E1122 Type 2 diabetes mellitus with diabetic chronic kidney disease: Secondary | ICD-10-CM | POA: Diagnosis not present

## 2017-09-25 DIAGNOSIS — R1312 Dysphagia, oropharyngeal phase: Secondary | ICD-10-CM | POA: Diagnosis not present

## 2017-09-25 DIAGNOSIS — I69320 Aphasia following cerebral infarction: Secondary | ICD-10-CM | POA: Diagnosis not present

## 2017-09-25 DIAGNOSIS — I130 Hypertensive heart and chronic kidney disease with heart failure and stage 1 through stage 4 chronic kidney disease, or unspecified chronic kidney disease: Secondary | ICD-10-CM | POA: Diagnosis not present

## 2017-09-25 DIAGNOSIS — E1122 Type 2 diabetes mellitus with diabetic chronic kidney disease: Secondary | ICD-10-CM | POA: Diagnosis not present

## 2017-09-25 DIAGNOSIS — I69351 Hemiplegia and hemiparesis following cerebral infarction affecting right dominant side: Secondary | ICD-10-CM | POA: Diagnosis not present

## 2017-09-26 DIAGNOSIS — R1312 Dysphagia, oropharyngeal phase: Secondary | ICD-10-CM | POA: Diagnosis not present

## 2017-09-26 DIAGNOSIS — I130 Hypertensive heart and chronic kidney disease with heart failure and stage 1 through stage 4 chronic kidney disease, or unspecified chronic kidney disease: Secondary | ICD-10-CM | POA: Diagnosis not present

## 2017-09-26 DIAGNOSIS — I69351 Hemiplegia and hemiparesis following cerebral infarction affecting right dominant side: Secondary | ICD-10-CM | POA: Diagnosis not present

## 2017-09-26 DIAGNOSIS — E1122 Type 2 diabetes mellitus with diabetic chronic kidney disease: Secondary | ICD-10-CM | POA: Diagnosis not present

## 2017-09-26 DIAGNOSIS — I69320 Aphasia following cerebral infarction: Secondary | ICD-10-CM | POA: Diagnosis not present

## 2017-09-27 ENCOUNTER — Encounter
Admission: RE | Admit: 2017-09-27 | Discharge: 2017-09-27 | Disposition: A | Discharge status: Home / Self Care | Payer: Medicare Other | Source: Ambulatory Visit | Attending: Internal Medicine | Admitting: Internal Medicine

## 2017-09-27 DIAGNOSIS — G4761 Periodic limb movement disorder: Secondary | ICD-10-CM | POA: Diagnosis not present

## 2017-09-27 DIAGNOSIS — K219 Gastro-esophageal reflux disease without esophagitis: Secondary | ICD-10-CM | POA: Diagnosis not present

## 2017-09-27 DIAGNOSIS — Z7901 Long term (current) use of anticoagulants: Secondary | ICD-10-CM | POA: Diagnosis not present

## 2017-09-27 DIAGNOSIS — M48061 Spinal stenosis, lumbar region without neurogenic claudication: Secondary | ICD-10-CM | POA: Diagnosis not present

## 2017-09-27 DIAGNOSIS — H409 Unspecified glaucoma: Secondary | ICD-10-CM | POA: Diagnosis not present

## 2017-09-27 DIAGNOSIS — I5032 Chronic diastolic (congestive) heart failure: Secondary | ICD-10-CM | POA: Diagnosis not present

## 2017-09-27 DIAGNOSIS — G4733 Obstructive sleep apnea (adult) (pediatric): Secondary | ICD-10-CM | POA: Diagnosis not present

## 2017-09-27 DIAGNOSIS — J302 Other seasonal allergic rhinitis: Secondary | ICD-10-CM | POA: Diagnosis not present

## 2017-09-27 DIAGNOSIS — I130 Hypertensive heart and chronic kidney disease with heart failure and stage 1 through stage 4 chronic kidney disease, or unspecified chronic kidney disease: Secondary | ICD-10-CM | POA: Diagnosis not present

## 2017-09-27 DIAGNOSIS — E785 Hyperlipidemia, unspecified: Secondary | ICD-10-CM | POA: Diagnosis not present

## 2017-09-27 DIAGNOSIS — Z794 Long term (current) use of insulin: Secondary | ICD-10-CM | POA: Diagnosis not present

## 2017-09-27 DIAGNOSIS — I69391 Dysphagia following cerebral infarction: Secondary | ICD-10-CM | POA: Diagnosis not present

## 2017-09-27 DIAGNOSIS — I69320 Aphasia following cerebral infarction: Secondary | ICD-10-CM | POA: Diagnosis not present

## 2017-09-27 DIAGNOSIS — I69351 Hemiplegia and hemiparesis following cerebral infarction affecting right dominant side: Secondary | ICD-10-CM | POA: Diagnosis not present

## 2017-09-27 DIAGNOSIS — R1312 Dysphagia, oropharyngeal phase: Secondary | ICD-10-CM | POA: Diagnosis not present

## 2017-09-27 DIAGNOSIS — N3281 Overactive bladder: Secondary | ICD-10-CM | POA: Diagnosis not present

## 2017-09-27 DIAGNOSIS — I480 Paroxysmal atrial fibrillation: Secondary | ICD-10-CM | POA: Diagnosis not present

## 2017-09-27 DIAGNOSIS — N183 Chronic kidney disease, stage 3 (moderate): Secondary | ICD-10-CM | POA: Diagnosis not present

## 2017-09-27 DIAGNOSIS — I2510 Atherosclerotic heart disease of native coronary artery without angina pectoris: Secondary | ICD-10-CM | POA: Diagnosis not present

## 2017-09-27 DIAGNOSIS — E1122 Type 2 diabetes mellitus with diabetic chronic kidney disease: Secondary | ICD-10-CM | POA: Diagnosis not present

## 2017-09-28 DIAGNOSIS — M48061 Spinal stenosis, lumbar region without neurogenic claudication: Secondary | ICD-10-CM | POA: Diagnosis not present

## 2017-09-28 DIAGNOSIS — E1122 Type 2 diabetes mellitus with diabetic chronic kidney disease: Secondary | ICD-10-CM | POA: Diagnosis not present

## 2017-09-28 DIAGNOSIS — K219 Gastro-esophageal reflux disease without esophagitis: Secondary | ICD-10-CM | POA: Diagnosis not present

## 2017-09-28 DIAGNOSIS — R1312 Dysphagia, oropharyngeal phase: Secondary | ICD-10-CM | POA: Diagnosis not present

## 2017-09-28 DIAGNOSIS — I480 Paroxysmal atrial fibrillation: Secondary | ICD-10-CM | POA: Diagnosis not present

## 2017-09-28 DIAGNOSIS — I69320 Aphasia following cerebral infarction: Secondary | ICD-10-CM | POA: Diagnosis not present

## 2017-09-28 DIAGNOSIS — G4733 Obstructive sleep apnea (adult) (pediatric): Secondary | ICD-10-CM | POA: Diagnosis not present

## 2017-09-28 DIAGNOSIS — J302 Other seasonal allergic rhinitis: Secondary | ICD-10-CM | POA: Diagnosis not present

## 2017-09-28 DIAGNOSIS — G4761 Periodic limb movement disorder: Secondary | ICD-10-CM | POA: Diagnosis not present

## 2017-09-28 DIAGNOSIS — E785 Hyperlipidemia, unspecified: Secondary | ICD-10-CM | POA: Diagnosis not present

## 2017-09-28 DIAGNOSIS — N3281 Overactive bladder: Secondary | ICD-10-CM | POA: Diagnosis not present

## 2017-09-28 DIAGNOSIS — H409 Unspecified glaucoma: Secondary | ICD-10-CM | POA: Diagnosis not present

## 2017-09-28 DIAGNOSIS — I130 Hypertensive heart and chronic kidney disease with heart failure and stage 1 through stage 4 chronic kidney disease, or unspecified chronic kidney disease: Secondary | ICD-10-CM | POA: Diagnosis not present

## 2017-09-28 DIAGNOSIS — I69391 Dysphagia following cerebral infarction: Secondary | ICD-10-CM | POA: Diagnosis not present

## 2017-09-28 DIAGNOSIS — Z794 Long term (current) use of insulin: Secondary | ICD-10-CM | POA: Diagnosis not present

## 2017-09-28 DIAGNOSIS — I5032 Chronic diastolic (congestive) heart failure: Secondary | ICD-10-CM | POA: Diagnosis not present

## 2017-09-28 DIAGNOSIS — Z7901 Long term (current) use of anticoagulants: Secondary | ICD-10-CM | POA: Diagnosis not present

## 2017-09-28 DIAGNOSIS — N183 Chronic kidney disease, stage 3 (moderate): Secondary | ICD-10-CM | POA: Diagnosis not present

## 2017-09-28 DIAGNOSIS — I2510 Atherosclerotic heart disease of native coronary artery without angina pectoris: Secondary | ICD-10-CM | POA: Diagnosis not present

## 2017-09-28 DIAGNOSIS — I69351 Hemiplegia and hemiparesis following cerebral infarction affecting right dominant side: Secondary | ICD-10-CM | POA: Diagnosis not present

## 2017-10-01 ENCOUNTER — Ambulatory Visit
Admission: RE | Admit: 2017-10-01 | Discharge: 2017-10-01 | Disposition: A | Discharge status: Home / Self Care | Payer: Medicare Other | Source: Ambulatory Visit | Attending: Internal Medicine | Admitting: Internal Medicine

## 2017-10-01 DIAGNOSIS — I69320 Aphasia following cerebral infarction: Secondary | ICD-10-CM | POA: Diagnosis not present

## 2017-10-01 DIAGNOSIS — M48061 Spinal stenosis, lumbar region without neurogenic claudication: Secondary | ICD-10-CM | POA: Diagnosis not present

## 2017-10-01 DIAGNOSIS — I5032 Chronic diastolic (congestive) heart failure: Secondary | ICD-10-CM | POA: Diagnosis not present

## 2017-10-01 DIAGNOSIS — I130 Hypertensive heart and chronic kidney disease with heart failure and stage 1 through stage 4 chronic kidney disease, or unspecified chronic kidney disease: Secondary | ICD-10-CM | POA: Diagnosis not present

## 2017-10-01 DIAGNOSIS — I2510 Atherosclerotic heart disease of native coronary artery without angina pectoris: Secondary | ICD-10-CM | POA: Diagnosis not present

## 2017-10-01 DIAGNOSIS — I480 Paroxysmal atrial fibrillation: Secondary | ICD-10-CM | POA: Diagnosis not present

## 2017-10-01 DIAGNOSIS — I69391 Dysphagia following cerebral infarction: Secondary | ICD-10-CM | POA: Diagnosis not present

## 2017-10-01 DIAGNOSIS — R1312 Dysphagia, oropharyngeal phase: Secondary | ICD-10-CM | POA: Insufficient documentation

## 2017-10-01 DIAGNOSIS — Z7901 Long term (current) use of anticoagulants: Secondary | ICD-10-CM | POA: Diagnosis not present

## 2017-10-01 DIAGNOSIS — N183 Chronic kidney disease, stage 3 (moderate): Secondary | ICD-10-CM | POA: Diagnosis not present

## 2017-10-01 DIAGNOSIS — K219 Gastro-esophageal reflux disease without esophagitis: Secondary | ICD-10-CM | POA: Diagnosis not present

## 2017-10-01 DIAGNOSIS — H409 Unspecified glaucoma: Secondary | ICD-10-CM | POA: Diagnosis not present

## 2017-10-01 DIAGNOSIS — J302 Other seasonal allergic rhinitis: Secondary | ICD-10-CM | POA: Diagnosis not present

## 2017-10-01 DIAGNOSIS — Z794 Long term (current) use of insulin: Secondary | ICD-10-CM | POA: Diagnosis not present

## 2017-10-01 DIAGNOSIS — E785 Hyperlipidemia, unspecified: Secondary | ICD-10-CM | POA: Diagnosis not present

## 2017-10-01 DIAGNOSIS — N3281 Overactive bladder: Secondary | ICD-10-CM | POA: Diagnosis not present

## 2017-10-01 DIAGNOSIS — G4733 Obstructive sleep apnea (adult) (pediatric): Secondary | ICD-10-CM | POA: Diagnosis not present

## 2017-10-01 DIAGNOSIS — E1122 Type 2 diabetes mellitus with diabetic chronic kidney disease: Secondary | ICD-10-CM | POA: Diagnosis not present

## 2017-10-01 DIAGNOSIS — G4761 Periodic limb movement disorder: Secondary | ICD-10-CM | POA: Diagnosis not present

## 2017-10-01 DIAGNOSIS — I69351 Hemiplegia and hemiparesis following cerebral infarction affecting right dominant side: Secondary | ICD-10-CM | POA: Diagnosis not present

## 2017-10-01 NOTE — Therapy (Signed)
[place], [place], [address] Phone: [phone]   Fax:     Modified Barium Swallow  Patient Details  Name: Abigail Boyd MRN: [id number] Date of Birth: 1929/05/03 No data recorded  Encounter Date: 10/01/2017  End of Session - 10/01/17 1525    Visit Number  1    Number of Visits  1    Date for SLP Re-Evaluation  10/01/17    SLP Start Time  106    SLP Stop Time   1345    SLP Time Calculation (min)  45 min    Activity Tolerance  Patient tolerated treatment well       Past Medical History:  Diagnosis Date  . Abnormal nuclear stress test 06/21/2011   Inferolateral reversible defect;--> cardiac cath & PCI of CxOM, occluded RCA with collaterals;; followup Myoview 11/2012: Low risk. Fixed basal inferior artifact normal EF. No ischemia  . Anxiety    When necessary Xanax  . Anxiety   . Asthma   . Bilateral cataracts    Status post stroke or correction  . CAD S/P percutaneous coronary angioplasty 06/2011   s/P PCI to proximal OM1 w/ DES; occluded RCA with bridging and L-R collaterals (medical management)  . Chronic kidney disease (CKD), stage II (mild)    Related to current bladder infections (although diabetes cannot be excluded)  . Chronotropic incompetence with sinus node dysfunction [facility]) October 2013   On CPET test; beta blockers reduced  . Diabetes mellitus type 2, controlled ([place])    On oral medications  . Diverticulitis   . Essential hypertension    Allowing for permissive hypertension to avoid orthostatic hypotension  . GERD (gastroesophageal reflux disease)    On PPI  . Glaucoma   . History of syncope    Per EP - neurocardiogenic & not Bradycardia related (no PPM)  . History of unstable angina 06/13/2011   Jaw pain awakening from sleep -- Myoview --CATH --> PCI  . Hyperlipidemia with target LDL less than 70    HDL at goal, LDL not at goal, borderline triglycerides. On Crestor 20 mg   . Migraines   . OSA (obstructive sleep apnea)    hx bladder infections  . Osteoarthritis   . PAF (paroxysmal atrial fibrillation) ([place]) 10-11/ 2014   CardioNet Event Monitor: NSR & S Brady -- Rates 50-100; Total A. fib burden 35 hours and 27 minutes. 1296 episodes, longest was 1 hour 29 minutes. Rate ranged from 52-169 beats per minute.  . Seasonal allergies   . Shortness of breath on exertion October 2013   2-D echo: Normal EF>55%, Gr 1DD, mild aortic sclerosis; Evaluated with CPET - peak VO2 97%; Chronotropic Incompetence (submaximal effort)  . Spinal stenosis of lumbar region 11/2011  . Stroke ([place])   . Tachycardia-bradycardia syndrome ([place]) 11/2012  . Urge incontinence     Past Surgical History:  Procedure Laterality Date  . APPENDECTOMY    . BREAST BIOPSY     both breast  . cataracts     both eyes  . COLONOSCOPY    . CORONARY ANGIOPLASTY WITH STENT PLACEMENT  07/05/2011   Promus Element DES 2.5 mm x 16 mm-post dilated to 2.65 mm CX-Proximal OM1  . Holter Monitor  04/2015   Sinus rhythm with rates 52-149 BPM. Isolated PACs with rare couplets and bigeminy. Multiple short runs of PAT/PSVT. Arrhythmia run 120 bpm for 204 beats. 14% of time was in A. fib/flutter. -  Reviewed by Dr. Caryl Comes. Not thought to be significant enough for her syncope.  Marland Kitchen KNEE ARTHROSCOPY WITH MEDIAL MENISECTOMY Right 06/24/2014   Procedure: RIGHT KNEE ARTHROSCOPY WITH partial lateral MENISECTOMY, abrasion chondroplasty of medial femoral condyle and patella, microfracture technique;  Surgeon: Latanya Maudlin, MD;  Location: [facility];  Service: Orthopedics;  Laterality: Right;  . LEFT HEART CATHETERIZATION WITH CORONARY ANGIOGRAM N/A 07/06/2011   Procedure: LEFT HEART CATHETERIZATION WITH CORONARY ANGIOGRAM;  Surgeon: Leonie Man, MD;  Location: [facility] CATH LAB: Unstable Angina --> Myoview wiht Inf-Lat Ischemia.  Proximal OM1 lesion --> PCI; 100% mid RCA with right to right and left to right bridging collaterals from  circumflex RPL and LAD the PDA.  Marland Kitchen NM MYOVIEW LTD  October 2014    Low risk. Fixed basal inferior artifact normal EF. No ischemia --> (as compared to pre-PCI Myoview revealing inferolateral ischemia)  . rotator cuff Right   . TRANSTHORACIC ECHOCARDIOGRAM  11/2014   EF 65-70%, Mod LVH. Normal wall motion. Gr 1 DD. Normal valves.  Marland Kitchen VAGINAL HYSTERECTOMY      There were no vitals filed for this visit.   Subjective: Patient behavior: (alertness, ability to follow instructions, etc.): Patient alert and able to participate in study.  Chief complaint: Patient had MBS 07/23/2017 that showed sensed aspiration of thin liquid and nectar-thick liquid via straw, oral hold, oral residue, and delayed pharyngeal swallow initiation.   Objective:  Radiological Procedure: A videoflouroscopic evaluation of oral-preparatory, reflex initiation, and pharyngeal phases of the swallow was performed; as well as a screening of the upper esophageal phase.  I. POSTURE: Upright in MBS chair  II. VIEW: Lateral  III. COMPENSATORY STRATEGIES: N/A  IV. BOLUSES ADMINISTERED:   Thin Liquid: 2 teaspoon presentations, 2 small cup rim sips, 2 small straw sips   Nectar-thick Liquid: 2 cup rim sips   Honey-thick Liquid: DNT   Puree: 2 teaspoon presentations   Mechanical Soft: 1/4 graham cracker in applesauce  V. RESULTS OF EVALUATION: A. ORAL PREPARATORY PHASE: (The lips, tongue, and velum are observed for strength and coordination)       **Overall Severity Rating: Mild; slow, disorganized posterior transfer, oral hold at times. Improved from last study- better bolus cohesion, less oral residue  B. SWALLOW INITIATION/REFLEX: (The reflex is normal if "triggered" by the time the bolus reached the base of the tongue)  **Overall Severity Rating: Mild; triggers while falling from the valleculae to the pyriform sinuses.  Improved from last study- liquids triggered at the pyriform sinuses  C. PHARYNGEAL PHASE:  (Pharyngeal function is normal if the bolus shows rapid, smooth, and continuous transit through the pharynx and there is no pharyngeal residue after the swallow)  **Overall Severity Rating: Within normal limits  D. LARYNGEAL PENETRATION: (Material entering into the laryngeal inlet/vestibule but not aspirated)  None  E. ASPIRATION: None  F. ESOPHAGEAL PHASE: (Screening of the upper esophagus) Could not view cervical esophagus due to positioning constraints.    ASSESSMENT: This 84 year old woman; with history of stroke and prior MBS (07/23/2017) that showed aspiration of thin liquid and nectar-thick liquid via straw; is presenting with mild oropharyngeal dysphagia characterized by mild disorganized oral management and delayed pharyngeal swallow initiation.  There was no observed laryngeal penetration or tracheal aspiration.  The patient self-limited bolus size with liquids- all boluses were small.  In comparison to previous MBS, patient demonstrates improved oral control (minimal oral residue post swallow), improved pharyngeal swallow timing, and no laryngeal penetration or aspiration.  This  study supports regular diet, if patient can manage with current dentition, with thin liquids.    PLAN/RECOMMENDATIONS:   A. Diet: Upgrade to regular with thin liquid, pending treating SLP clinical assessment   B. Swallowing Precautions: Per treating SLP;    C. Recommended consultation to: N/A   D. Therapy recommendations: Clinical swallow assessment to determine optimal diet consistency and swallowing guidelines per clinical assessment in conjunction with results of MBS and patient wishes.   E. Results and recommendations were discussed with the patient and her daughter immediately following the study and the final report routed to the referring MD and treating SLP.  Patient will benefit from skilled therapeutic intervention in order to improve the following deficits and impairments:   Oropharyngeal  dysphagia - Plan: DG OP Swallowing Func-Medicare/Speech Path, DG OP Swallowing Func-Medicare/Speech Path        Problem List Patient Active Problem List   Diagnosis Date Noted  . Hypertensive heart disease with congestive heart failure and stage 2 kidney disease ([place]) 09/06/2017  . Diabetes mellitus type 2 with neurological manifestations ([place]) 09/06/2017  . Diabetic neuropathy ([place]) 09/06/2017  . UI (urinary incontinence) 09/06/2017  . Chronic glaucoma 09/06/2017  . Acute CVA (cerebrovascular accident) ([place]) 07/23/2017  . Acute encephalopathy 07/21/2017  . Chronic diastolic CHF (congestive heart failure) ([place]) 07/21/2017  . Generalized weakness 04/23/2017  . Recurrent UTI 04/23/2017  . Anxiety 12/13/2015  . Allergic rhinitis 12/13/2015  . Urge incontinence 03/15/2015  . Constipation 03/15/2015  . Right knee pain 03/15/2015  . CKD stage 2 due to type 2 diabetes mellitus ([place])   . PLMD (periodic limb movement disorder) 06/12/2013  . Obstructive sleep apnea 03/05/2013  . Tachy-brady syndrome: Rates range from 50-169 bpm in A. fib 12/20/2012  . History of syncope: Unclear etiology 12/19/2012  . PAF (paroxysmal atrial fibrillation) ([facility]);  CHA2DS2-VASc Score =6 on 15 mg Xarelto 05/16/2012    Class: Diagnosis of  . Long term current use of anticoagulant therapy 05/16/2012  . CAD-PCI OM1, 100% RCA (L-R colllaterals) 07/07/2011  . Essential hypertension     Class: Diagnosis of  . Dyslipidemia associated with type 2 diabetes mellitus ([facility])     Class: Diagnosis of  . Benign endometrial hyperplasia 03/21/2011  . Diverticulitis 03/21/2011  . GERD (gastroesophageal reflux disease) 03/21/2011  . Glaucoma (increased eye pressure) 03/21/2011  . Goiter 03/21/2011  . History of carpal tunnel syndrome 03/21/2011  . Osteopenia 03/21/2011  . Vitamin D deficiency disease 03/21/2011   Leroy Sea, MS/CCC- SLP  Lou Miner 10/01/2017, 3:25 PM  [facility] [place], [place], [address] Phone: [phone]   Fax:     Name: Abigail Boyd MRN: [id number] Date of Birth: December 17, 1929

## 2017-10-02 DIAGNOSIS — G4761 Periodic limb movement disorder: Secondary | ICD-10-CM | POA: Diagnosis not present

## 2017-10-02 DIAGNOSIS — K219 Gastro-esophageal reflux disease without esophagitis: Secondary | ICD-10-CM | POA: Diagnosis not present

## 2017-10-02 DIAGNOSIS — I69320 Aphasia following cerebral infarction: Secondary | ICD-10-CM | POA: Diagnosis not present

## 2017-10-02 DIAGNOSIS — I69391 Dysphagia following cerebral infarction: Secondary | ICD-10-CM | POA: Diagnosis not present

## 2017-10-02 DIAGNOSIS — I130 Hypertensive heart and chronic kidney disease with heart failure and stage 1 through stage 4 chronic kidney disease, or unspecified chronic kidney disease: Secondary | ICD-10-CM | POA: Diagnosis not present

## 2017-10-02 DIAGNOSIS — I5032 Chronic diastolic (congestive) heart failure: Secondary | ICD-10-CM | POA: Diagnosis not present

## 2017-10-02 DIAGNOSIS — H409 Unspecified glaucoma: Secondary | ICD-10-CM | POA: Diagnosis not present

## 2017-10-02 DIAGNOSIS — N3281 Overactive bladder: Secondary | ICD-10-CM | POA: Diagnosis not present

## 2017-10-02 DIAGNOSIS — M48061 Spinal stenosis, lumbar region without neurogenic claudication: Secondary | ICD-10-CM | POA: Diagnosis not present

## 2017-10-02 DIAGNOSIS — R1312 Dysphagia, oropharyngeal phase: Secondary | ICD-10-CM | POA: Diagnosis not present

## 2017-10-02 DIAGNOSIS — J302 Other seasonal allergic rhinitis: Secondary | ICD-10-CM | POA: Diagnosis not present

## 2017-10-02 DIAGNOSIS — I69351 Hemiplegia and hemiparesis following cerebral infarction affecting right dominant side: Secondary | ICD-10-CM | POA: Diagnosis not present

## 2017-10-02 DIAGNOSIS — N183 Chronic kidney disease, stage 3 (moderate): Secondary | ICD-10-CM | POA: Diagnosis not present

## 2017-10-02 DIAGNOSIS — G4733 Obstructive sleep apnea (adult) (pediatric): Secondary | ICD-10-CM | POA: Diagnosis not present

## 2017-10-02 DIAGNOSIS — Z794 Long term (current) use of insulin: Secondary | ICD-10-CM | POA: Diagnosis not present

## 2017-10-02 DIAGNOSIS — I480 Paroxysmal atrial fibrillation: Secondary | ICD-10-CM | POA: Diagnosis not present

## 2017-10-02 DIAGNOSIS — Z7901 Long term (current) use of anticoagulants: Secondary | ICD-10-CM | POA: Diagnosis not present

## 2017-10-02 DIAGNOSIS — E785 Hyperlipidemia, unspecified: Secondary | ICD-10-CM | POA: Diagnosis not present

## 2017-10-02 DIAGNOSIS — I2510 Atherosclerotic heart disease of native coronary artery without angina pectoris: Secondary | ICD-10-CM | POA: Diagnosis not present

## 2017-10-02 DIAGNOSIS — E1122 Type 2 diabetes mellitus with diabetic chronic kidney disease: Secondary | ICD-10-CM | POA: Diagnosis not present

## 2017-10-03 DIAGNOSIS — I130 Hypertensive heart and chronic kidney disease with heart failure and stage 1 through stage 4 chronic kidney disease, or unspecified chronic kidney disease: Secondary | ICD-10-CM | POA: Diagnosis not present

## 2017-10-03 DIAGNOSIS — N3281 Overactive bladder: Secondary | ICD-10-CM | POA: Diagnosis not present

## 2017-10-03 DIAGNOSIS — N183 Chronic kidney disease, stage 3 (moderate): Secondary | ICD-10-CM | POA: Diagnosis not present

## 2017-10-03 DIAGNOSIS — E1122 Type 2 diabetes mellitus with diabetic chronic kidney disease: Secondary | ICD-10-CM | POA: Diagnosis not present

## 2017-10-03 DIAGNOSIS — J302 Other seasonal allergic rhinitis: Secondary | ICD-10-CM | POA: Diagnosis not present

## 2017-10-03 DIAGNOSIS — G4733 Obstructive sleep apnea (adult) (pediatric): Secondary | ICD-10-CM | POA: Diagnosis not present

## 2017-10-03 DIAGNOSIS — I2510 Atherosclerotic heart disease of native coronary artery without angina pectoris: Secondary | ICD-10-CM | POA: Diagnosis not present

## 2017-10-03 DIAGNOSIS — I69351 Hemiplegia and hemiparesis following cerebral infarction affecting right dominant side: Secondary | ICD-10-CM | POA: Diagnosis not present

## 2017-10-03 DIAGNOSIS — Z794 Long term (current) use of insulin: Secondary | ICD-10-CM | POA: Diagnosis not present

## 2017-10-03 DIAGNOSIS — M48061 Spinal stenosis, lumbar region without neurogenic claudication: Secondary | ICD-10-CM | POA: Diagnosis not present

## 2017-10-03 DIAGNOSIS — I480 Paroxysmal atrial fibrillation: Secondary | ICD-10-CM | POA: Diagnosis not present

## 2017-10-03 DIAGNOSIS — H409 Unspecified glaucoma: Secondary | ICD-10-CM | POA: Diagnosis not present

## 2017-10-03 DIAGNOSIS — Z7901 Long term (current) use of anticoagulants: Secondary | ICD-10-CM | POA: Diagnosis not present

## 2017-10-03 DIAGNOSIS — K219 Gastro-esophageal reflux disease without esophagitis: Secondary | ICD-10-CM | POA: Diagnosis not present

## 2017-10-03 DIAGNOSIS — R1312 Dysphagia, oropharyngeal phase: Secondary | ICD-10-CM | POA: Diagnosis not present

## 2017-10-03 DIAGNOSIS — E785 Hyperlipidemia, unspecified: Secondary | ICD-10-CM | POA: Diagnosis not present

## 2017-10-03 DIAGNOSIS — G4761 Periodic limb movement disorder: Secondary | ICD-10-CM | POA: Diagnosis not present

## 2017-10-03 DIAGNOSIS — I69320 Aphasia following cerebral infarction: Secondary | ICD-10-CM | POA: Diagnosis not present

## 2017-10-03 DIAGNOSIS — I5032 Chronic diastolic (congestive) heart failure: Secondary | ICD-10-CM | POA: Diagnosis not present

## 2017-10-03 DIAGNOSIS — I69391 Dysphagia following cerebral infarction: Secondary | ICD-10-CM | POA: Diagnosis not present

## 2017-10-04 ENCOUNTER — Non-Acute Institutional Stay (SKILLED_NURSING_FACILITY): Payer: Medicare Other | Admitting: Adult Health

## 2017-10-04 ENCOUNTER — Encounter: Payer: Self-pay | Admitting: Adult Health

## 2017-10-04 ENCOUNTER — Other Ambulatory Visit: Payer: Self-pay

## 2017-10-04 DIAGNOSIS — I69391 Dysphagia following cerebral infarction: Secondary | ICD-10-CM | POA: Diagnosis not present

## 2017-10-04 DIAGNOSIS — I5032 Chronic diastolic (congestive) heart failure: Secondary | ICD-10-CM

## 2017-10-04 DIAGNOSIS — I480 Paroxysmal atrial fibrillation: Secondary | ICD-10-CM

## 2017-10-04 DIAGNOSIS — I69351 Hemiplegia and hemiparesis following cerebral infarction affecting right dominant side: Secondary | ICD-10-CM | POA: Diagnosis not present

## 2017-10-04 DIAGNOSIS — I130 Hypertensive heart and chronic kidney disease with heart failure and stage 1 through stage 4 chronic kidney disease, or unspecified chronic kidney disease: Secondary | ICD-10-CM | POA: Diagnosis not present

## 2017-10-04 DIAGNOSIS — I69320 Aphasia following cerebral infarction: Secondary | ICD-10-CM | POA: Diagnosis not present

## 2017-10-04 DIAGNOSIS — I2510 Atherosclerotic heart disease of native coronary artery without angina pectoris: Secondary | ICD-10-CM | POA: Diagnosis not present

## 2017-10-04 DIAGNOSIS — J302 Other seasonal allergic rhinitis: Secondary | ICD-10-CM | POA: Diagnosis not present

## 2017-10-04 DIAGNOSIS — N182 Chronic kidney disease, stage 2 (mild): Secondary | ICD-10-CM

## 2017-10-04 DIAGNOSIS — M5441 Lumbago with sciatica, right side: Secondary | ICD-10-CM

## 2017-10-04 DIAGNOSIS — N183 Chronic kidney disease, stage 3 (moderate): Secondary | ICD-10-CM | POA: Diagnosis not present

## 2017-10-04 DIAGNOSIS — M48061 Spinal stenosis, lumbar region without neurogenic claudication: Secondary | ICD-10-CM | POA: Diagnosis not present

## 2017-10-04 DIAGNOSIS — N3281 Overactive bladder: Secondary | ICD-10-CM | POA: Diagnosis not present

## 2017-10-04 DIAGNOSIS — H409 Unspecified glaucoma: Secondary | ICD-10-CM | POA: Diagnosis not present

## 2017-10-04 DIAGNOSIS — G4733 Obstructive sleep apnea (adult) (pediatric): Secondary | ICD-10-CM | POA: Diagnosis not present

## 2017-10-04 DIAGNOSIS — E785 Hyperlipidemia, unspecified: Secondary | ICD-10-CM | POA: Diagnosis not present

## 2017-10-04 DIAGNOSIS — E1122 Type 2 diabetes mellitus with diabetic chronic kidney disease: Secondary | ICD-10-CM | POA: Diagnosis not present

## 2017-10-04 DIAGNOSIS — R1312 Dysphagia, oropharyngeal phase: Secondary | ICD-10-CM | POA: Diagnosis not present

## 2017-10-04 DIAGNOSIS — Z794 Long term (current) use of insulin: Secondary | ICD-10-CM | POA: Diagnosis not present

## 2017-10-04 DIAGNOSIS — K219 Gastro-esophageal reflux disease without esophagitis: Secondary | ICD-10-CM | POA: Diagnosis not present

## 2017-10-04 DIAGNOSIS — Z7901 Long term (current) use of anticoagulants: Secondary | ICD-10-CM | POA: Diagnosis not present

## 2017-10-04 DIAGNOSIS — G4761 Periodic limb movement disorder: Secondary | ICD-10-CM | POA: Diagnosis not present

## 2017-10-04 DIAGNOSIS — G8929 Other chronic pain: Secondary | ICD-10-CM

## 2017-10-04 MED ORDER — TRAMADOL HCL 50 MG PO TABS: 50.0000 mg | ORAL_TABLET | Freq: Four times a day (QID) | ORAL | 0 refills | Status: DC | PRN

## 2017-10-04 NOTE — Progress Notes (Signed)
Location:   [facility] Room Number: 307A Place of Service:  SNF (31)   CODE STATUS: FULL   Allergies  Allergen Reactions  . Amlodipine Cough  . Clopidogrel Bisulfate Cough  . Kenalog [Triamcinolone Acetonide] Other (See Comments)    unknown  . Lisinopril Cough  . Losartan Cough  . Sulfa Antibiotics Hives and Rash    Chief Complaint  Patient presents with  . Medical Management of Chronic Issues    Back pain; hypertensive heart disease; paf; chf     HPI:  She is a 84 year old long term resident of this facility being seen for the management of her chronic illnesses; back pain hypertensive heart disease; paf; chf. She is a poor historian and cannot fully participate in the hpi or ros; but does complain of back and right hip pain. There are no reports of increased anxiety; no insomnia.   Past Medical History:  Diagnosis Date  . Abnormal nuclear stress test 06/21/2011   Inferolateral reversible defect;--> cardiac cath & PCI of CxOM, occluded RCA with collaterals;; followup Myoview 11/2012: Low risk. Fixed basal inferior artifact normal EF. No ischemia  . Anxiety    When necessary Xanax  . Anxiety   . Asthma   . Bilateral cataracts    Status post stroke or correction  . CAD S/P percutaneous coronary angioplasty 06/2011   s/P PCI to proximal OM1 w/ DES; occluded RCA with bridging and L-R collaterals (medical management)  . Chronic kidney disease (CKD), stage II (mild)    Related to current bladder infections (although diabetes cannot be excluded)  . Chronotropic incompetence with sinus node dysfunction [facility]) October 2013   On CPET test; beta blockers reduced  . Diabetes mellitus type 2, controlled ([place])    On oral medications  . Diverticulitis   . Essential hypertension    Allowing for permissive hypertension to avoid orthostatic hypotension  . GERD (gastroesophageal reflux disease)    On PPI  . Glaucoma   . History of syncope    Per EP -  neurocardiogenic & not Bradycardia related (no PPM)  . History of unstable angina 06/13/2011   Jaw pain awakening from sleep -- Myoview --CATH --> PCI  . Hyperlipidemia with target LDL less than 70    HDL at goal, LDL not at goal, borderline triglycerides. On Crestor 20 mg  . Migraines   . OSA (obstructive sleep apnea)    hx bladder infections  . Osteoarthritis   . PAF (paroxysmal atrial fibrillation) ([place]) 10-11/ 2014   CardioNet Event Monitor: NSR & S Brady -- Rates 50-100; Total A. fib burden 35 hours and 27 minutes. 1296 episodes, longest was 1 hour 29 minutes. Rate ranged from 52-169 beats per minute.  . Seasonal allergies   . Shortness of breath on exertion October 2013   2-D echo: Normal EF>55%, Gr 1DD, mild aortic sclerosis; Evaluated with CPET - peak VO2 97%; Chronotropic Incompetence (submaximal effort)  . Spinal stenosis of lumbar region 11/2011  . Stroke ([place])   . Tachycardia-bradycardia syndrome ([place]) 11/2012  . Urge incontinence     Past Surgical History:  Procedure Laterality Date  . APPENDECTOMY    . BREAST BIOPSY     both breast  . cataracts     both eyes  . COLONOSCOPY    . CORONARY ANGIOPLASTY WITH STENT PLACEMENT  07/05/2011   Promus Element DES 2.5 mm x 16 mm-post dilated to 2.65 mm CX-Proximal OM1  . Holter Monitor  04/2015   Sinus rhythm with rates 52-149 BPM. Isolated PACs with rare couplets and bigeminy. Multiple short runs of PAT/PSVT. Arrhythmia run 120 bpm for 204 beats. 14% of time was in A. fib/flutter. - Reviewed by Dr. Caryl Comes. Not thought to be significant enough for her syncope.  Marland Kitchen KNEE ARTHROSCOPY WITH MEDIAL MENISECTOMY Right 06/24/2014   Procedure: RIGHT KNEE ARTHROSCOPY WITH partial lateral MENISECTOMY, abrasion chondroplasty of medial femoral condyle and patella, microfracture technique;  Surgeon: Latanya Maudlin, MD;  Location: [facility] ORS;  Service: Orthopedics;  Laterality: Right;  . LEFT HEART CATHETERIZATION WITH CORONARY ANGIOGRAM N/A 07/06/2011    Procedure: LEFT HEART CATHETERIZATION WITH CORONARY ANGIOGRAM;  Surgeon: Leonie Man, MD;  Location: [facility] CATH LAB: Unstable Angina --> Myoview wiht Inf-Lat Ischemia.  Proximal OM1 lesion --> PCI; 100% mid RCA with right to right and left to right bridging collaterals from circumflex RPL and LAD the PDA.  Marland Kitchen NM MYOVIEW LTD  October 2014    Low risk. Fixed basal inferior artifact normal EF. No ischemia --> (as compared to pre-PCI Myoview revealing inferolateral ischemia)  . rotator cuff Right   . TRANSTHORACIC ECHOCARDIOGRAM  11/2014   EF 65-70%, Mod LVH. Normal wall motion. Gr 1 DD. Normal valves.  Marland Kitchen VAGINAL HYSTERECTOMY      Social History   Socioeconomic History  . Marital status: Widowed    Spouse name: Not on file  . Number of children: Not on file  . Years of education: Not on file  . Highest education level: Not on file  Occupational History  . Occupation: retired  Scientific laboratory technician  . Financial resource strain: Not hard at all  . Food insecurity:    Worry: Patient refused    Inability: Patient refused  . Transportation needs:    Medical: Patient refused    Non-medical: Patient refused  Tobacco Use  . Smoking status: Never Smoker  . Smokeless tobacco: Never Used  Substance and Sexual Activity  . Alcohol use: No  . Drug use: No  . Sexual activity: Never    Birth control/protection: Post-menopausal  Lifestyle  . Physical activity:    Days per week: Patient refused    Minutes per session: Patient refused  . Stress: Only a little  Relationships  . Social connections:    Talks on phone: Patient refused    Gets together: Patient refused    Attends religious service: Patient refused    Active member of club or organization: Patient refused    Attends meetings of clubs or organizations: Patient refused    Relationship status: Patient refused  . Intimate partner violence:    Fear of current or ex partner: Patient refused    Emotionally abused: Patient refused    Physically  abused: Patient refused    Forced sexual activity: Patient refused  Other Topics Concern  . Not on file  Social History Narrative   Widowed mother of one, grandmother 2. Has not been using silver take as much lately due to knee discomfort. Usually exercises with Silver Sneakers on regular basis.   Family History  Problem Relation Age of Onset  . COPD Mother   . Pulmonary fibrosis Mother   . Heart attack Father   . Heart disease Father   . Stroke Father   . Cancer Sister   . Cancer Sister   . Clotting disorder Sister   . Heart attack Sister   . Arthritis Unknown   . Breast cancer Sister   . Colon cancer Neg  Hx   . Esophageal cancer Neg Hx   . Stomach cancer Neg Hx       VITAL SIGNS BP (!) 122/59   Pulse 68   Temp 97.8 F (36.6 C) (Oral)   Resp 16   Ht 5\' 4"  (1.626 m)   Wt 171 lb 9.6 oz (77.8 kg)   SpO2 96%   BMI 29.46 kg/m   Outpatient Encounter Medications as of 10/04/2017  Medication Sig  . acetaminophen (TYLENOL) 650 MG CR tablet Take 650 mg by mouth every 6 (six) hours as needed for pain.  Marland Kitchen atorvastatin (LIPITOR) 80 MG tablet Take 1 tablet (80 mg total) by mouth daily at 6 PM.  . azelastine (ASTELIN) 0.1 % nasal spray Place 2 sprays into both nostrils at bedtime. Use in each nostril as directed   . docusate sodium (STOOL SOFTENER) 100 MG capsule Take 100 mg by mouth at bedtime.   . fish oil-omega-3 fatty acids 1000 MG capsule Take 1 g by mouth daily.   . furosemide (LASIX) 20 MG tablet Take 20 mg by mouth daily.  Marland Kitchen gabapentin (NEURONTIN) 300 MG capsule Take 300 mg by mouth 3 (three) times daily.   Marland Kitchen glucagon (GLUCAGON EMERGENCY) 1 MG injection Inject 1 mg into the vein once as needed. Give 1 mg IM for Hypoglycemia (symptomatic) not responding to oral glucose, snack. May repeat x 1 after 15 minutes. USE ONLY IF PT IS OR IS BECOMING UNRESPONSIVE  . HUMALOG KWIKPEN 100 UNIT/ML KiwkPen Inject 22 Units into the skin 3 (three) times daily with meals. DM2. Hold for CBG <  200  . Infant Care Products [facility] EX) Apply liberal amount topically to area of skin irritation prn. OK to leave at bedside.  . Insulin Glargine (TOUJEO MAX SOLOSTAR) 300 UNIT/ML SOPN Inject 75 Units into the skin at bedtime. Alternate abdominal injection sites.  . latanoprost (XALATAN) 0.005 % ophthalmic solution Place 1 drop into both eyes at bedtime.  Marland Kitchen levocetirizine (XYZAL) 5 MG tablet Take 2.5 mg by mouth daily.   . Magnesium 500 MG CAPS Take 500 mg by mouth at bedtime.   . metFORMIN (GLUCOPHAGE-XR) 500 MG 24 hr tablet Take 500 mg by mouth daily with breakfast.   . metoprolol tartrate (LOPRESSOR) 25 MG tablet Take 1 tablet (25 mg total) by mouth 2 (two) times daily.  . nitroGLYCERIN (NITROSTAT) 0.4 MG SL tablet Place 0.4 mg under the tongue every 5 (five) minutes as needed for chest pain.  Marland Kitchen nystatin cream (MYCOSTATIN) Apply 1 application topically daily.  Marland Kitchen oxybutynin (DITROPAN) 5 MG tablet Take 5 mg by mouth 2 (two) times daily.  . pantoprazole (PROTONIX) 40 MG tablet Take 1 tablet (40 mg total) by mouth daily.  Marland Kitchen PARoxetine (PAXIL) 20 MG tablet Take 1 tablet (20 mg total) by mouth daily.  . polyethylene glycol (MIRALAX / GLYCOLAX) packet Take 17 g by mouth daily as needed for moderate constipation.  . Rivaroxaban (XARELTO) 15 MG TABS tablet Take 15 mg by mouth every evening.   Marland Kitchen rOPINIRole (REQUIP) 0.5 MG tablet TAKE 1 TABLET BY MOUTH AT  BEDTIME  . timolol (TIMOPTIC) 0.5 % ophthalmic solution Place 1 drop into both eyes at bedtime.   . traMADol (ULTRAM) 50 MG tablet Take 1 tablet (50 mg total) by mouth 3 (three) times daily.  . traMADol (ULTRAM) 50 MG tablet Take 50 mg by mouth every 12 (twelve) hours as needed.  . trolamine salicylate (ASPERCREME) 10 % cream Apply 1 application topically 3 (  three) times daily.   Marland Kitchen UNABLE TO FIND Diet order: upgrade patient to regular consistencies and thin liquids; add extra bowl of gravy.  Diet Type: NAS, NCS  . [DISCONTINUED] acetaminophen  (TYLENOL) 325 MG tablet Take 650 mg by mouth every 4 (four) hours as needed. for pain/ increased temp. May be administered orally, per G-tube if needed or rectally if unable to swallow (separate order). Maximum dose for 24 hours is 3,000 mg from all sources of Acetaminophen/ Tylenol  . [DISCONTINUED] estradiol (ESTRACE VAGINAL) 0.1 MG/GM vaginal cream Apply 0.5mg  (pea-sized amount)  just inside the vaginal introitus with a finger-tip on Monday, Wednesday and Friday nights.  . [DISCONTINUED] LUMIGAN 0.01 % SOLN Place 1 drop into both eyes at bedtime.   . [DISCONTINUED] mirabegron ER (MYRBETRIQ) 50 MG TB24 tablet Take 50 mg by mouth daily.   . [DISCONTINUED] traMADol (ULTRAM) 50 MG tablet Take 1 tablet (50 mg total) by mouth every 4 (four) hours as needed for severe pain.   No facility-administered encounter medications on file as of 10/04/2017.      SIGNIFICANT DIAGNOSTIC EXAMS  LABS REVIEWED: PREVIOUS  07-21-17: wbc 10.1; hgb 13.8; hct 43.8; mcv 94.5; plt 323 glucose 221; bun 18; creat 1.21; k+ 4.2; na++ 136; ca 9.7 liver normal albumin 3.6  07-22-17: hgb a1c 7.8 tsh 4.194 vit B 12: 342 07-24-17: wbc 10.5; hgb 12.9; hct 39.5; mcv 93.8 plt 331; glucose 121 bun 18; creat 1.13;  4.1; na++ 144; ca 9.4 chol 234; ldl 157; trig 218; hdl 33   NO NEW LABS.    Review of Systems  Unable to perform ROS: Other (poor historian; but does have back pain )   Physical Exam  Constitutional: She is oriented to person, place, and time. She appears well-developed and well-nourished. No distress.  Neck: Thyromegaly present.  Mild thyroid enlargement   Cardiovascular: Normal rate, regular rhythm, normal heart sounds and intact distal pulses.  Pulmonary/Chest: Effort normal and breath sounds normal. No respiratory distress.  Abdominal: Soft. Bowel sounds are normal. She exhibits no distension. There is no tenderness.  Musculoskeletal: She exhibits no edema.  Has generalized weakness present.  Is able to move all  extremities   Does have tenderness to palpation on right side of back   Lymphadenopathy:    She has no cervical adenopathy.  Neurological: She is alert and oriented to person, place, and time.  Skin: Skin is warm and dry. She is not diaphoretic.  Psychiatric: She has a normal mood and affect.      ASSESSMENT/ PLAN:  TODAY:   1. Hypertensive heart disease with congestive heart failure and stage 2 kidney disease: is stable b/p 122/59: will continue lopressor 25 mg twice daily   2. PAF( paroxymal atrial fibrillation) CHA2DS2- VASc score+6: heart rate is stable; will continue lopressor 25 mg twice daily for rate control and xarelto 15 mg daily   3.  Chronic diastolic heart failure: is stable will continue lasix 20 mg daily   4. Chronic bilateral lower back pain with right sided sciatica: is worse; will change her ultram to 50 mg every 6 hours routinely and lidoderm patch to her back daily   PREVIOUS   5. CAD-PCI OM1, 100% RCA (l-R collaterals): is stable will continue lopressor 25 mg twice daily has prn ntg is on statin  6. Dyslipidemia associated with type 2 diabetes mellitus: stable LDL 157 trig 218 : will continue lipitor 80 mg daily and fish oil 1 gm daily   7.  Diabetes mellitus type 2 with neurological manifestations: is stable hgb a1c 7.8: will continue humalog 22 units with meals and toujeo 75 units nightly metformin xr 500 mg daily   8. Acute CVA: is neurologically stable will continue xarelto 15 mg daily   9. Diabetic neuropathy: is stable will continue neurontin 300 mg three times daily   10.  GERD without esophagitis: stable will continue protonix 40 mg daily   11.  Chronic constipation: is stable will continue colace nightly and miralax daily as needed  12.  Allergic rhinitis: is stable will continue astelin nasal spray nightly and xyzal 2.5 mg daily   13. Benign endometrial hyperplasia: is stable: will stop her vaginal cream due to advanced age more than likely is  not receiving benefit from this medication.    14. UI: without change: will continue ditropan 5 mg twice daily   15.  Bilateral chronic glaucoma: is stable will continue timoptic to both eyes and lumigan to both eyes.   16. PLMD (periodic limb movement disorder) is stable will continue requip 0.5 mg nightly   17. Anxiety: is stable will continue xanax 0.25 mg nightly as needed   18. CKD stage 2 due to type 2 diabetes mellitus: is stable bun 18; creat 1.13    MD is aware of resident's narcotic use and is in agreement with current plan of care. We will attempt to wean resident as apropriate   Ok Edwards NP [facility]  Contact [phone] Monday through Friday 8am- 5pm  After hours call [phone]

## 2017-10-04 NOTE — Telephone Encounter (Signed)
Rx sent to [facility] phone : [phone] , fax : [phone]  

## 2017-10-05 ENCOUNTER — Other Ambulatory Visit: Payer: Self-pay

## 2017-10-05 MED ORDER — TRAMADOL HCL 50 MG PO TABS: 50.0000 mg | ORAL_TABLET | Freq: Three times a day (TID) | ORAL | 0 refills | Status: DC

## 2017-10-05 NOTE — Telephone Encounter (Signed)
Rx sent to [facility] phone : [phone] , fax : [phone]  

## 2017-10-08 DIAGNOSIS — Z794 Long term (current) use of insulin: Secondary | ICD-10-CM | POA: Diagnosis not present

## 2017-10-08 DIAGNOSIS — I480 Paroxysmal atrial fibrillation: Secondary | ICD-10-CM | POA: Diagnosis not present

## 2017-10-08 DIAGNOSIS — I69351 Hemiplegia and hemiparesis following cerebral infarction affecting right dominant side: Secondary | ICD-10-CM | POA: Diagnosis not present

## 2017-10-08 DIAGNOSIS — M48061 Spinal stenosis, lumbar region without neurogenic claudication: Secondary | ICD-10-CM | POA: Diagnosis not present

## 2017-10-08 DIAGNOSIS — I5032 Chronic diastolic (congestive) heart failure: Secondary | ICD-10-CM | POA: Diagnosis not present

## 2017-10-08 DIAGNOSIS — I69320 Aphasia following cerebral infarction: Secondary | ICD-10-CM | POA: Diagnosis not present

## 2017-10-08 DIAGNOSIS — J302 Other seasonal allergic rhinitis: Secondary | ICD-10-CM | POA: Diagnosis not present

## 2017-10-08 DIAGNOSIS — G4733 Obstructive sleep apnea (adult) (pediatric): Secondary | ICD-10-CM | POA: Diagnosis not present

## 2017-10-08 DIAGNOSIS — N3281 Overactive bladder: Secondary | ICD-10-CM | POA: Diagnosis not present

## 2017-10-08 DIAGNOSIS — K219 Gastro-esophageal reflux disease without esophagitis: Secondary | ICD-10-CM | POA: Diagnosis not present

## 2017-10-08 DIAGNOSIS — N183 Chronic kidney disease, stage 3 (moderate): Secondary | ICD-10-CM | POA: Diagnosis not present

## 2017-10-08 DIAGNOSIS — E785 Hyperlipidemia, unspecified: Secondary | ICD-10-CM | POA: Diagnosis not present

## 2017-10-08 DIAGNOSIS — I69391 Dysphagia following cerebral infarction: Secondary | ICD-10-CM | POA: Diagnosis not present

## 2017-10-08 DIAGNOSIS — E1122 Type 2 diabetes mellitus with diabetic chronic kidney disease: Secondary | ICD-10-CM | POA: Diagnosis not present

## 2017-10-08 DIAGNOSIS — I130 Hypertensive heart and chronic kidney disease with heart failure and stage 1 through stage 4 chronic kidney disease, or unspecified chronic kidney disease: Secondary | ICD-10-CM | POA: Diagnosis not present

## 2017-10-08 DIAGNOSIS — H409 Unspecified glaucoma: Secondary | ICD-10-CM | POA: Diagnosis not present

## 2017-10-08 DIAGNOSIS — I2510 Atherosclerotic heart disease of native coronary artery without angina pectoris: Secondary | ICD-10-CM | POA: Diagnosis not present

## 2017-10-08 DIAGNOSIS — G4761 Periodic limb movement disorder: Secondary | ICD-10-CM | POA: Diagnosis not present

## 2017-10-08 DIAGNOSIS — R1312 Dysphagia, oropharyngeal phase: Secondary | ICD-10-CM | POA: Diagnosis not present

## 2017-10-08 DIAGNOSIS — Z7901 Long term (current) use of anticoagulants: Secondary | ICD-10-CM | POA: Diagnosis not present

## 2017-10-09 ENCOUNTER — Other Ambulatory Visit
Admission: RE | Admit: 2017-10-09 | Discharge: 2017-10-09 | Disposition: A | Discharge status: Home / Self Care | Payer: Medicare Other | Source: Skilled Nursing Facility | Attending: Adult Health | Admitting: Adult Health

## 2017-10-09 DIAGNOSIS — I130 Hypertensive heart and chronic kidney disease with heart failure and stage 1 through stage 4 chronic kidney disease, or unspecified chronic kidney disease: Secondary | ICD-10-CM | POA: Diagnosis not present

## 2017-10-09 DIAGNOSIS — I5032 Chronic diastolic (congestive) heart failure: Secondary | ICD-10-CM | POA: Diagnosis not present

## 2017-10-09 DIAGNOSIS — E1122 Type 2 diabetes mellitus with diabetic chronic kidney disease: Secondary | ICD-10-CM | POA: Diagnosis not present

## 2017-10-09 DIAGNOSIS — E119 Type 2 diabetes mellitus without complications: Secondary | ICD-10-CM | POA: Diagnosis not present

## 2017-10-09 DIAGNOSIS — R1312 Dysphagia, oropharyngeal phase: Secondary | ICD-10-CM | POA: Diagnosis not present

## 2017-10-09 DIAGNOSIS — G4761 Periodic limb movement disorder: Secondary | ICD-10-CM | POA: Diagnosis not present

## 2017-10-09 DIAGNOSIS — Z7901 Long term (current) use of anticoagulants: Secondary | ICD-10-CM | POA: Diagnosis not present

## 2017-10-09 DIAGNOSIS — I69320 Aphasia following cerebral infarction: Secondary | ICD-10-CM | POA: Diagnosis not present

## 2017-10-09 DIAGNOSIS — H409 Unspecified glaucoma: Secondary | ICD-10-CM | POA: Diagnosis not present

## 2017-10-09 DIAGNOSIS — E785 Hyperlipidemia, unspecified: Secondary | ICD-10-CM | POA: Diagnosis not present

## 2017-10-09 DIAGNOSIS — N183 Chronic kidney disease, stage 3 (moderate): Secondary | ICD-10-CM | POA: Diagnosis not present

## 2017-10-09 DIAGNOSIS — I69351 Hemiplegia and hemiparesis following cerebral infarction affecting right dominant side: Secondary | ICD-10-CM | POA: Diagnosis not present

## 2017-10-09 DIAGNOSIS — G4733 Obstructive sleep apnea (adult) (pediatric): Secondary | ICD-10-CM | POA: Diagnosis not present

## 2017-10-09 DIAGNOSIS — I2510 Atherosclerotic heart disease of native coronary artery without angina pectoris: Secondary | ICD-10-CM | POA: Diagnosis not present

## 2017-10-09 DIAGNOSIS — J302 Other seasonal allergic rhinitis: Secondary | ICD-10-CM | POA: Diagnosis not present

## 2017-10-09 DIAGNOSIS — M48061 Spinal stenosis, lumbar region without neurogenic claudication: Secondary | ICD-10-CM | POA: Diagnosis not present

## 2017-10-09 DIAGNOSIS — I69391 Dysphagia following cerebral infarction: Secondary | ICD-10-CM | POA: Diagnosis not present

## 2017-10-09 DIAGNOSIS — N3281 Overactive bladder: Secondary | ICD-10-CM | POA: Diagnosis not present

## 2017-10-09 DIAGNOSIS — K219 Gastro-esophageal reflux disease without esophagitis: Secondary | ICD-10-CM | POA: Diagnosis not present

## 2017-10-09 DIAGNOSIS — Z794 Long term (current) use of insulin: Secondary | ICD-10-CM | POA: Diagnosis not present

## 2017-10-09 DIAGNOSIS — I480 Paroxysmal atrial fibrillation: Secondary | ICD-10-CM | POA: Diagnosis not present

## 2017-10-10 DIAGNOSIS — I130 Hypertensive heart and chronic kidney disease with heart failure and stage 1 through stage 4 chronic kidney disease, or unspecified chronic kidney disease: Secondary | ICD-10-CM | POA: Diagnosis not present

## 2017-10-10 DIAGNOSIS — I2510 Atherosclerotic heart disease of native coronary artery without angina pectoris: Secondary | ICD-10-CM | POA: Diagnosis not present

## 2017-10-10 DIAGNOSIS — I69351 Hemiplegia and hemiparesis following cerebral infarction affecting right dominant side: Secondary | ICD-10-CM | POA: Diagnosis not present

## 2017-10-10 DIAGNOSIS — G4761 Periodic limb movement disorder: Secondary | ICD-10-CM | POA: Diagnosis not present

## 2017-10-10 DIAGNOSIS — Z7901 Long term (current) use of anticoagulants: Secondary | ICD-10-CM | POA: Diagnosis not present

## 2017-10-10 DIAGNOSIS — I5032 Chronic diastolic (congestive) heart failure: Secondary | ICD-10-CM | POA: Diagnosis not present

## 2017-10-10 DIAGNOSIS — N183 Chronic kidney disease, stage 3 (moderate): Secondary | ICD-10-CM | POA: Diagnosis not present

## 2017-10-10 DIAGNOSIS — N3281 Overactive bladder: Secondary | ICD-10-CM | POA: Diagnosis not present

## 2017-10-10 DIAGNOSIS — E1122 Type 2 diabetes mellitus with diabetic chronic kidney disease: Secondary | ICD-10-CM | POA: Diagnosis not present

## 2017-10-10 DIAGNOSIS — G4733 Obstructive sleep apnea (adult) (pediatric): Secondary | ICD-10-CM | POA: Diagnosis not present

## 2017-10-10 DIAGNOSIS — I69391 Dysphagia following cerebral infarction: Secondary | ICD-10-CM | POA: Diagnosis not present

## 2017-10-10 DIAGNOSIS — H409 Unspecified glaucoma: Secondary | ICD-10-CM | POA: Diagnosis not present

## 2017-10-10 DIAGNOSIS — Z794 Long term (current) use of insulin: Secondary | ICD-10-CM | POA: Diagnosis not present

## 2017-10-10 DIAGNOSIS — K219 Gastro-esophageal reflux disease without esophagitis: Secondary | ICD-10-CM | POA: Diagnosis not present

## 2017-10-10 DIAGNOSIS — J302 Other seasonal allergic rhinitis: Secondary | ICD-10-CM | POA: Diagnosis not present

## 2017-10-10 DIAGNOSIS — I69320 Aphasia following cerebral infarction: Secondary | ICD-10-CM | POA: Diagnosis not present

## 2017-10-10 DIAGNOSIS — I480 Paroxysmal atrial fibrillation: Secondary | ICD-10-CM | POA: Diagnosis not present

## 2017-10-10 DIAGNOSIS — R1312 Dysphagia, oropharyngeal phase: Secondary | ICD-10-CM | POA: Diagnosis not present

## 2017-10-10 DIAGNOSIS — E785 Hyperlipidemia, unspecified: Secondary | ICD-10-CM | POA: Diagnosis not present

## 2017-10-10 DIAGNOSIS — M48061 Spinal stenosis, lumbar region without neurogenic claudication: Secondary | ICD-10-CM | POA: Diagnosis not present

## 2017-10-10 LAB — MICROALBUMIN, URINE: MICROALB UR: 5.9 ug/mL — AB

## 2017-10-11 DIAGNOSIS — G4761 Periodic limb movement disorder: Secondary | ICD-10-CM | POA: Diagnosis not present

## 2017-10-11 DIAGNOSIS — N183 Chronic kidney disease, stage 3 (moderate): Secondary | ICD-10-CM | POA: Diagnosis not present

## 2017-10-11 DIAGNOSIS — I480 Paroxysmal atrial fibrillation: Secondary | ICD-10-CM | POA: Diagnosis not present

## 2017-10-11 DIAGNOSIS — N3281 Overactive bladder: Secondary | ICD-10-CM | POA: Diagnosis not present

## 2017-10-11 DIAGNOSIS — M48061 Spinal stenosis, lumbar region without neurogenic claudication: Secondary | ICD-10-CM | POA: Diagnosis not present

## 2017-10-11 DIAGNOSIS — I69391 Dysphagia following cerebral infarction: Secondary | ICD-10-CM | POA: Diagnosis not present

## 2017-10-11 DIAGNOSIS — K219 Gastro-esophageal reflux disease without esophagitis: Secondary | ICD-10-CM | POA: Diagnosis not present

## 2017-10-11 DIAGNOSIS — J302 Other seasonal allergic rhinitis: Secondary | ICD-10-CM | POA: Diagnosis not present

## 2017-10-11 DIAGNOSIS — I130 Hypertensive heart and chronic kidney disease with heart failure and stage 1 through stage 4 chronic kidney disease, or unspecified chronic kidney disease: Secondary | ICD-10-CM | POA: Diagnosis not present

## 2017-10-11 DIAGNOSIS — I2510 Atherosclerotic heart disease of native coronary artery without angina pectoris: Secondary | ICD-10-CM | POA: Diagnosis not present

## 2017-10-11 DIAGNOSIS — R1312 Dysphagia, oropharyngeal phase: Secondary | ICD-10-CM | POA: Diagnosis not present

## 2017-10-11 DIAGNOSIS — H409 Unspecified glaucoma: Secondary | ICD-10-CM | POA: Diagnosis not present

## 2017-10-11 DIAGNOSIS — Z7901 Long term (current) use of anticoagulants: Secondary | ICD-10-CM | POA: Diagnosis not present

## 2017-10-11 DIAGNOSIS — E785 Hyperlipidemia, unspecified: Secondary | ICD-10-CM | POA: Diagnosis not present

## 2017-10-11 DIAGNOSIS — G4733 Obstructive sleep apnea (adult) (pediatric): Secondary | ICD-10-CM | POA: Diagnosis not present

## 2017-10-11 DIAGNOSIS — I5032 Chronic diastolic (congestive) heart failure: Secondary | ICD-10-CM | POA: Diagnosis not present

## 2017-10-11 DIAGNOSIS — E1122 Type 2 diabetes mellitus with diabetic chronic kidney disease: Secondary | ICD-10-CM | POA: Diagnosis not present

## 2017-10-11 DIAGNOSIS — Z794 Long term (current) use of insulin: Secondary | ICD-10-CM | POA: Diagnosis not present

## 2017-10-11 DIAGNOSIS — I69320 Aphasia following cerebral infarction: Secondary | ICD-10-CM | POA: Diagnosis not present

## 2017-10-11 DIAGNOSIS — I69351 Hemiplegia and hemiparesis following cerebral infarction affecting right dominant side: Secondary | ICD-10-CM | POA: Diagnosis not present

## 2017-10-12 DIAGNOSIS — N183 Chronic kidney disease, stage 3 (moderate): Secondary | ICD-10-CM | POA: Diagnosis not present

## 2017-10-12 DIAGNOSIS — E785 Hyperlipidemia, unspecified: Secondary | ICD-10-CM | POA: Diagnosis not present

## 2017-10-12 DIAGNOSIS — I5032 Chronic diastolic (congestive) heart failure: Secondary | ICD-10-CM | POA: Diagnosis not present

## 2017-10-12 DIAGNOSIS — I69391 Dysphagia following cerebral infarction: Secondary | ICD-10-CM | POA: Diagnosis not present

## 2017-10-12 DIAGNOSIS — I480 Paroxysmal atrial fibrillation: Secondary | ICD-10-CM | POA: Diagnosis not present

## 2017-10-12 DIAGNOSIS — I69320 Aphasia following cerebral infarction: Secondary | ICD-10-CM | POA: Diagnosis not present

## 2017-10-12 DIAGNOSIS — J302 Other seasonal allergic rhinitis: Secondary | ICD-10-CM | POA: Diagnosis not present

## 2017-10-12 DIAGNOSIS — Z794 Long term (current) use of insulin: Secondary | ICD-10-CM | POA: Diagnosis not present

## 2017-10-12 DIAGNOSIS — I69351 Hemiplegia and hemiparesis following cerebral infarction affecting right dominant side: Secondary | ICD-10-CM | POA: Diagnosis not present

## 2017-10-12 DIAGNOSIS — I2510 Atherosclerotic heart disease of native coronary artery without angina pectoris: Secondary | ICD-10-CM | POA: Diagnosis not present

## 2017-10-12 DIAGNOSIS — R1312 Dysphagia, oropharyngeal phase: Secondary | ICD-10-CM | POA: Diagnosis not present

## 2017-10-12 DIAGNOSIS — M48061 Spinal stenosis, lumbar region without neurogenic claudication: Secondary | ICD-10-CM | POA: Diagnosis not present

## 2017-10-12 DIAGNOSIS — K219 Gastro-esophageal reflux disease without esophagitis: Secondary | ICD-10-CM | POA: Diagnosis not present

## 2017-10-12 DIAGNOSIS — I130 Hypertensive heart and chronic kidney disease with heart failure and stage 1 through stage 4 chronic kidney disease, or unspecified chronic kidney disease: Secondary | ICD-10-CM | POA: Diagnosis not present

## 2017-10-12 DIAGNOSIS — G4733 Obstructive sleep apnea (adult) (pediatric): Secondary | ICD-10-CM | POA: Diagnosis not present

## 2017-10-12 DIAGNOSIS — Z7901 Long term (current) use of anticoagulants: Secondary | ICD-10-CM | POA: Diagnosis not present

## 2017-10-12 DIAGNOSIS — E1122 Type 2 diabetes mellitus with diabetic chronic kidney disease: Secondary | ICD-10-CM | POA: Diagnosis not present

## 2017-10-12 DIAGNOSIS — G4761 Periodic limb movement disorder: Secondary | ICD-10-CM | POA: Diagnosis not present

## 2017-10-12 DIAGNOSIS — H409 Unspecified glaucoma: Secondary | ICD-10-CM | POA: Diagnosis not present

## 2017-10-12 DIAGNOSIS — N3281 Overactive bladder: Secondary | ICD-10-CM | POA: Diagnosis not present

## 2017-10-15 DIAGNOSIS — M5441 Lumbago with sciatica, right side: Secondary | ICD-10-CM

## 2017-10-15 DIAGNOSIS — G8929 Other chronic pain: Secondary | ICD-10-CM | POA: Insufficient documentation

## 2017-10-23 ENCOUNTER — Encounter: Payer: Self-pay | Admitting: Adult Health

## 2017-10-23 NOTE — Progress Notes (Signed)
Opened in error; Disregard.

## 2017-10-24 ENCOUNTER — Encounter: Payer: Self-pay | Admitting: Adult Health

## 2017-10-24 NOTE — Progress Notes (Signed)
Location:   [facility] [place] [facility] Room Number: 307A Place of Service:  SNF (31)   CODE STATUS: FULL  Allergies  Allergen Reactions  . Amlodipine Cough  . Clopidogrel Bisulfate Cough  . Kenalog [Triamcinolone Acetonide] Other (See Comments)    unknown  . Lisinopril Cough  . Losartan Cough  . Sulfa Antibiotics Hives and Rash    Chief Complaint  Patient presents with  . Acute Visit    Pain Right Knee    HPI:    Past Medical History:  Diagnosis Date  . Abnormal nuclear stress test 06/21/2011   Inferolateral reversible defect;--> cardiac cath & PCI of CxOM, occluded RCA with collaterals;; followup Myoview 11/2012: Low risk. Fixed basal inferior artifact normal EF. No ischemia  . Anxiety   . Asthma   . Bilateral cataracts    Status post stroke or correction  . CAD S/P percutaneous coronary angioplasty 06/2011   s/P PCI to proximal OM1 w/ DES; occluded RCA with bridging and L-R collaterals (medical management)  . Chronic kidney disease (CKD), stage II (mild)    Related to current bladder infections (although diabetes cannot be excluded)  . Chronotropic incompetence with sinus node dysfunction [facility]) October 2013   On CPET test; beta blockers reduced  . Diabetes mellitus type 2, controlled ([place])    On oral medications  . Diverticulitis   . Essential hypertension    Allowing for permissive hypertension to avoid orthostatic hypotension  . GERD (gastroesophageal reflux disease)    On PPI  . Glaucoma   . History of syncope    Per EP - neurocardiogenic & not Bradycardia related (no PPM)  . History of unstable angina 06/13/2011   Jaw pain awakening from sleep -- Myoview --CATH --> PCI  . Hyperlipidemia with target LDL less than 70    HDL at goal, LDL not at goal, borderline triglycerides. On Crestor 20 mg  . Migraines   . OSA (obstructive sleep apnea)    hx bladder infections  . Osteoarthritis   . PAF (paroxysmal atrial fibrillation) ([place]) 10-11/ 2014   CardioNet Event Monitor: NSR & S Brady -- Rates 50-100; Total A. fib burden 35 hours and 27 minutes. 1296 episodes, longest was 1 hour 29 minutes. Rate ranged from 52-169 beats per minute.  . Seasonal allergies   . Shortness of breath on exertion October 2013   2-D echo: Normal EF>55%, Gr 1DD, mild aortic sclerosis; Evaluated with CPET - peak VO2 97%; Chronotropic Incompetence (submaximal effort)  . Spinal stenosis of lumbar region 11/2011  . Stroke ([place])   . Tachycardia-bradycardia syndrome ([name]) 11/2012  . Urge incontinence     Past Surgical History:  Procedure Laterality Date  . APPENDECTOMY    . BREAST BIOPSY     both breast  . cataracts     both eyes  . COLONOSCOPY    . CORONARY ANGIOPLASTY WITH STENT PLACEMENT  07/05/2011   Promus Element DES 2.5 mm x 16 mm-post dilated to 2.65 mm CX-Proximal OM1  . Holter Monitor  04/2015   Sinus rhythm with rates 52-149 BPM. Isolated PACs with rare couplets and bigeminy. Multiple short runs of PAT/PSVT. Arrhythmia run 120 bpm for 204 beats. 14% of time was in A. fib/flutter. - Reviewed by Dr. Caryl Comes. Not thought to be significant enough for her syncope.  Marland Kitchen KNEE ARTHROSCOPY WITH MEDIAL MENISECTOMY Right 06/24/2014   Procedure: RIGHT KNEE ARTHROSCOPY WITH partial lateral MENISECTOMY, abrasion chondroplasty of medial femoral condyle and patella, microfracture technique;  Surgeon: Latanya Maudlin, MD;  Location: [facility] ORS;  Service: Orthopedics;  Laterality: Right;  . LEFT HEART CATHETERIZATION WITH CORONARY ANGIOGRAM N/A 07/06/2011   Procedure: LEFT HEART CATHETERIZATION WITH CORONARY ANGIOGRAM;  Surgeon: Leonie Man, MD;  Location: [facility] CATH LAB: Unstable Angina --> Myoview wiht Inf-Lat Ischemia.  Proximal OM1 lesion --> PCI; 100% mid RCA with right to right and left to right bridging collaterals from circumflex RPL and LAD the PDA.  Marland Kitchen NM MYOVIEW LTD  October 2014    Low risk. Fixed basal inferior artifact normal EF. No ischemia --> (as compared to pre-PCI  Myoview revealing inferolateral ischemia)  . rotator cuff Right   . TRANSTHORACIC ECHOCARDIOGRAM  11/2014   EF 65-70%, Mod LVH. Normal wall motion. Gr 1 DD. Normal valves.  Marland Kitchen VAGINAL HYSTERECTOMY      Social History   Socioeconomic History  . Marital status: Widowed    Spouse name: Not on file  . Number of children: Not on file  . Years of education: Not on file  . Highest education level: Not on file  Occupational History  . Occupation: retired  Scientific laboratory technician  . Financial resource strain: Not hard at all  . Food insecurity:    Worry: Patient refused    Inability: Patient refused  . Transportation needs:    Medical: Patient refused    Non-medical: Patient refused  Tobacco Use  . Smoking status: Never Smoker  . Smokeless tobacco: Never Used  Substance and Sexual Activity  . Alcohol use: No  . Drug use: No  . Sexual activity: Never    Birth control/protection: Post-menopausal  Lifestyle  . Physical activity:    Days per week: Patient refused    Minutes per session: Patient refused  . Stress: Only a little  Relationships  . Social connections:    Talks on phone: Patient refused    Gets together: Patient refused    Attends religious service: Patient refused    Active member of club or organization: Patient refused    Attends meetings of clubs or organizations: Patient refused    Relationship status: Patient refused  . Intimate partner violence:    Fear of current or ex partner: Patient refused    Emotionally abused: Patient refused    Physically abused: Patient refused    Forced sexual activity: Patient refused  Other Topics Concern  . Not on file  Social History Narrative   Widowed mother of one, grandmother 2. Has not been using silver take as much lately due to knee discomfort. Usually exercises with Silver Sneakers on regular basis.   Family History  Problem Relation Age of Onset  . COPD Mother   . Pulmonary fibrosis Mother   . Heart attack Father   . Heart  disease Father   . Stroke Father   . Cancer Sister   . Cancer Sister   . Clotting disorder Sister   . Heart attack Sister   . Arthritis Unknown   . Breast cancer Sister   . Colon cancer Neg Hx   . Esophageal cancer Neg Hx   . Stomach cancer Neg Hx       VITAL SIGNS BP (!) 126/55   Pulse 74   Temp 98.3 F (36.8 C) (Oral)   Resp 18   Ht 5\' 4"  (1.626 m)   Wt 171 lb 9.6 oz (77.8 kg)   SpO2 96%   BMI 29.46 kg/m   Outpatient Encounter Medications as of 10/24/2017  Medication Sig  .  acetaminophen (TYLENOL) 650 MG CR tablet Take 650 mg by mouth every 6 (six) hours as needed for pain.  Marland Kitchen atorvastatin (LIPITOR) 80 MG tablet Take 1 tablet (80 mg total) by mouth daily at 6 PM.  . azelastine (ASTELIN) 0.1 % nasal spray Place 2 sprays into both nostrils at bedtime. Use in each nostril as directed   . docusate sodium (STOOL SOFTENER) 100 MG capsule Take 100 mg by mouth at bedtime.   . fish oil-omega-3 fatty acids 1000 MG capsule Take 1 g by mouth daily.   . furosemide (LASIX) 20 MG tablet Take 20 mg by mouth daily.  Marland Kitchen gabapentin (NEURONTIN) 300 MG capsule Take 300 mg by mouth 3 (three) times daily.   Marland Kitchen glucagon (GLUCAGON EMERGENCY) 1 MG injection Inject 1 mg into the vein once as needed. Give 1 mg IM for Hypoglycemia (symptomatic) not responding to oral glucose, snack. May repeat x 1 after 15 minutes. USE ONLY IF PT IS OR IS BECOMING UNRESPONSIVE  . HUMALOG KWIKPEN 100 UNIT/ML KiwkPen Inject 22 Units into the skin 3 (three) times daily with meals. DM2. Hold for CBG < 200  . Infant Care Products [facility] EX) Apply liberal amount topically to area of skin irritation prn. OK to leave at bedside.  . Insulin Glargine (TOUJEO MAX SOLOSTAR) 300 UNIT/ML SOPN Inject 75 Units into the skin at bedtime. Alternate abdominal injection sites.  . latanoprost (XALATAN) 0.005 % ophthalmic solution Place 1 drop into both eyes at bedtime.  Marland Kitchen levocetirizine (XYZAL) 5 MG tablet Take 2.5 mg by mouth daily.   .  Lidocaine-Menthol (ICY HOT LIDOCAINE PLUS MENTHOL) 4-1 % PTCH Apply 1 patch topically daily. apply to lower back in the a.m. for pain management  . Magnesium 500 MG CAPS Take 500 mg by mouth at bedtime.   . metFORMIN (GLUCOPHAGE-XR) 500 MG 24 hr tablet Take 500 mg by mouth daily with breakfast.   . metoprolol tartrate (LOPRESSOR) 25 MG tablet Take 1 tablet (25 mg total) by mouth 2 (two) times daily.  . nitroGLYCERIN (NITROSTAT) 0.4 MG SL tablet Place 0.4 mg under the tongue every 5 (five) minutes as needed for chest pain.  Marland Kitchen nystatin cream (MYCOSTATIN) Apply 1 application topically daily.  Marland Kitchen oxybutynin (DITROPAN) 5 MG tablet Take 5 mg by mouth 2 (two) times daily.  . pantoprazole (PROTONIX) 40 MG tablet Take 1 tablet (40 mg total) by mouth daily.  Marland Kitchen PARoxetine (PAXIL) 20 MG tablet Take 1 tablet (20 mg total) by mouth daily.  . polyethylene glycol (MIRALAX / GLYCOLAX) packet Take 17 g by mouth daily as needed for moderate constipation.  . Rivaroxaban (XARELTO) 15 MG TABS tablet Take 15 mg by mouth every evening.   Marland Kitchen rOPINIRole (REQUIP) 0.5 MG tablet TAKE 1 TABLET BY MOUTH AT  BEDTIME  . timolol (TIMOPTIC) 0.5 % ophthalmic solution Place 1 drop into both eyes at bedtime.   . traMADol (ULTRAM) 50 MG tablet Take 50 mg by mouth every 12 (twelve) hours as needed.  . traMADol (ULTRAM) 50 MG tablet Take 50 mg by mouth every 6 (six) hours.   . trolamine salicylate (ASPERCREME) 10 % cream Apply 1 application topically 3 (three) times daily. Apply to right knee  . UNABLE TO FIND Diet order: upgrade patient to regular consistencies and thin liquids; add extra bowl of gravy.  Diet Type: NAS, NCS   No facility-administered encounter medications on file as of 10/24/2017.      SIGNIFICANT DIAGNOSTIC EXAMS   LABS REVIEWED: PREVIOUS  07-21-17: wbc 10.1; hgb 13.8; hct 43.8; mcv 94.5; plt 323 glucose 221; bun 18; creat 1.21; k+ 4.2; na++ 136; ca 9.7 liver normal albumin 3.6  07-22-17: hgb a1c 7.8 tsh 4.194 vit  B 12: 342 07-24-17: wbc 10.5; hgb 12.9; hct 39.5; mcv 93.8 plt 331; glucose 121 bun 18; creat 1.13;  4.1; na++ 144; ca 9.4 chol 234; ldl 157; trig 218; hdl 33   NO NEW LABS.    Review of Systems  Unable to perform ROS: Other (poor historian; but does have back pain )   Physical Exam  Constitutional: She is oriented to person, place, and time. She appears well-developed and well-nourished. No distress.  Neck: Thyromegaly present.  Mild thyroid enlargement   Cardiovascular: Normal rate, regular rhythm, normal heart sounds and intact distal pulses.  Pulmonary/Chest: Effort normal and breath sounds normal. No respiratory distress.  Abdominal: Soft. Bowel sounds are normal. She exhibits no distension. There is no tenderness.  Musculoskeletal: She exhibits no edema.  Has generalized weakness present.  Is able to move all extremities   Does have tenderness to palpation on right side of back   Lymphadenopathy:    She has no cervical adenopathy.  Neurological: She is alert and oriented to person, place, and time.  Skin: Skin is warm and dry. She is not diaphoretic.  Psychiatric: She has a normal mood and affect.      ASSESSMENT/ PLAN:  TODAY:   1. Hypertensive heart disease with congestive heart failure and stage 2 kidney disease: is stable b/p 122/59: will continue lopressor 25 mg twice daily   2. PAF( paroxymal atrial fibrillation) CHA2DS2- VASc score+6: heart rate is stable; will continue lopressor 25 mg twice daily for rate control and xarelto 15 mg daily   3.  Chronic diastolic heart failure: is stable will continue lasix 20 mg daily   4. Chronic bilateral lower back pain with right sided sciatica: is worse; will change her ultram to 50 mg every 6 hours routinely and lidoderm patch to her back daily   PREVIOUS   5. CAD-PCI OM1, 100% RCA (l-R collaterals): is stable will continue lopressor 25 mg twice daily has prn ntg is on statin  6. Dyslipidemia associated with type 2  diabetes mellitus: stable LDL 157 trig 218 : will continue lipitor 80 mg daily and fish oil 1 gm daily   7.  Diabetes mellitus type 2 with neurological manifestations: is stable hgb a1c 7.8: will continue humalog 22 units with meals and toujeo 75 units nightly metformin xr 500 mg daily   8. Acute CVA: is neurologically stable will continue xarelto 15 mg daily   9. Diabetic neuropathy: is stable will continue neurontin 300 mg three times daily   10.  GERD without esophagitis: stable will continue protonix 40 mg daily   11.  Chronic constipation: is stable will continue colace nightly and miralax daily as needed  12.  Allergic rhinitis: is stable will continue astelin nasal spray nightly and xyzal 2.5 mg daily   13. Benign endometrial hyperplasia: is stable: will stop her vaginal cream due to advanced age more than likely is not receiving benefit from this medication.    14. UI: without change: will continue ditropan 5 mg twice daily   15.  Bilateral chronic glaucoma: is stable will continue timoptic to both eyes and lumigan to both eyes.   16. PLMD (periodic limb movement disorder) is stable will continue requip 0.5 mg nightly   17. Anxiety: is stable will continue  xanax 0.25 mg nightly as needed   18. CKD stage 2 due to type 2 diabetes mellitus: is stable bun 18; creat 1.13      ASSESSMENT/ PLAN:    MD is aware of resident's narcotic use and is in agreement with current plan of care. We will attempt to wean resident as apropriate   Ok Edwards NP [facility]  Contact [phone] Monday through Friday 8am- 5pm  After hours call [phone]

## 2017-10-25 ENCOUNTER — Other Ambulatory Visit
Admission: RE | Admit: 2017-10-25 | Discharge: 2017-10-25 | Disposition: A | Discharge status: Home / Self Care | Payer: Medicare Other | Source: Ambulatory Visit | Attending: Adult Health | Admitting: Adult Health

## 2017-10-25 DIAGNOSIS — I5032 Chronic diastolic (congestive) heart failure: Secondary | ICD-10-CM | POA: Diagnosis not present

## 2017-10-25 LAB — MAGNESIUM: Magnesium: 2.1 mg/dL (ref 1.7–2.4)

## 2017-10-28 ENCOUNTER — Encounter
Admission: RE | Admit: 2017-10-28 | Discharge: 2017-10-28 | Disposition: A | Discharge status: Home / Self Care | Payer: Medicare Other | Source: Ambulatory Visit | Attending: Internal Medicine | Admitting: Internal Medicine

## 2017-10-30 ENCOUNTER — Encounter: Payer: Self-pay | Admitting: Adult Health

## 2017-10-30 ENCOUNTER — Non-Acute Institutional Stay (SKILLED_NURSING_FACILITY): Payer: Medicare Other | Admitting: Adult Health

## 2017-10-30 DIAGNOSIS — I480 Paroxysmal atrial fibrillation: Secondary | ICD-10-CM | POA: Diagnosis not present

## 2017-10-30 DIAGNOSIS — G2581 Restless legs syndrome: Secondary | ICD-10-CM | POA: Diagnosis not present

## 2017-10-30 DIAGNOSIS — I10 Essential (primary) hypertension: Secondary | ICD-10-CM

## 2017-10-30 DIAGNOSIS — K219 Gastro-esophageal reflux disease without esophagitis: Secondary | ICD-10-CM | POA: Diagnosis not present

## 2017-10-30 DIAGNOSIS — E1149 Type 2 diabetes mellitus with other diabetic neurological complication: Secondary | ICD-10-CM

## 2017-10-30 DIAGNOSIS — F39 Unspecified mood [affective] disorder: Secondary | ICD-10-CM

## 2017-10-30 NOTE — Progress Notes (Signed)
[facility]:  [facility] Room Number: [place] of Service:  SNF ([phone]) Provider:  Durenda Age, NP  Patient Care Team: Leone Haven, MD as PCP - General (Family Medicine)  Extended Emergency Contact Information Primary Emergency Contact: [facility] Address: [address]          [place], [place] [address] [name] of [place] Phone: [phone] Work Phone: [phone] Mobile Phone: [phone] Relation: Daughter Secondary Emergency Contact: Marliss Czar, [place] [address] [name] of [place] Phone: [phone] Work Phone: [phone] Mobile Phone: [phone] Relation: Sister  Code Status:  FULL CODE  Goals of care: Advanced Directive information Advanced Directives 10/30/2017  Does Patient Have a Medical Advance Directive? No  Type of Advance Directive -  Does patient want to make changes to medical advance directive? -  Copy of [place] in Chart? -  Would patient like information on creating a medical advance directive? -  Pre-existing out of facility DNR order (yellow form or pink MOST form) -     Chief Complaint  Patient presents with  . Medical Management of Chronic Issues    Routine Visit    HPI:  Pt is an 84 y.o. female seen today for medical management of chronic diseases.  He is a long-term care resident of [place].  He has a PMH of back pain, hypertensive heart disease, PAF, and congestive heart failure. She was seen in her room today. She was reported to cry out loud and hallucinates of people who are not in the room. No reported fever nor cough.   Past Medical History:  Diagnosis Date  . Abnormal nuclear stress test 06/21/2011   Inferolateral reversible defect;--> cardiac cath & PCI of CxOM, occluded RCA with collaterals;; followup Myoview 11/2012: Low risk. Fixed basal inferior artifact normal EF. No ischemia  . Anxiety   . Asthma   . Bilateral cataracts    Status post stroke or correction  . CAD S/P percutaneous coronary angioplasty 06/2011   s/P PCI to proximal OM1 w/ DES; occluded RCA with bridging and L-R collaterals (medical management)  . Chronic kidney disease (CKD), stage II (mild)    Related to current bladder infections (although diabetes cannot be excluded)  . Chronotropic incompetence with sinus node dysfunction [facility]) October 2013   On CPET test; beta blockers reduced  . Diabetes mellitus type 2, controlled ([place])    On oral medications  . Diverticulitis   . Essential hypertension    Allowing for permissive hypertension to avoid orthostatic hypotension  . GERD (gastroesophageal reflux disease)    On PPI  . Glaucoma   . History of syncope    Per EP - neurocardiogenic & not Bradycardia related (no PPM)  . History of unstable angina 06/13/2011   Jaw pain awakening from sleep -- Myoview --CATH --> PCI  . Hyperlipidemia with target LDL less than 70    HDL at goal, LDL not at goal, borderline triglycerides. On Crestor 20 mg  . Migraines   . OSA (obstructive sleep apnea)    hx bladder infections  . Osteoarthritis   . PAF (paroxysmal atrial fibrillation) ([place]) 10-11/ 2014   CardioNet Event Monitor: NSR & S Brady -- Rates 50-100; Total A. fib burden 35 hours and 27 minutes. 1296 episodes, longest was 1 hour 29 minutes. Rate ranged from 52-169 beats per minute.  . Seasonal allergies   . Shortness of breath on exertion October  2013   2-D echo: Normal EF>55%, Gr 1DD, mild aortic sclerosis; Evaluated with CPET - peak VO2 97%; Chronotropic Incompetence (submaximal effort)  . Spinal stenosis of lumbar region 11/2011  . Stroke ([place])   . Tachycardia-bradycardia syndrome ([place]) 11/2012  . Urge incontinence    Past Surgical History:  Procedure Laterality Date  . APPENDECTOMY    . BREAST BIOPSY     both breast  . cataracts     both eyes  . COLONOSCOPY    . CORONARY ANGIOPLASTY WITH STENT PLACEMENT  07/05/2011   Promus Element DES 2.5 mm  x 16 mm-post dilated to 2.65 mm CX-Proximal OM1  . Holter Monitor  04/2015   Sinus rhythm with rates 52-149 BPM. Isolated PACs with rare couplets and bigeminy. Multiple short runs of PAT/PSVT. Arrhythmia run 120 bpm for 204 beats. 14% of time was in A. fib/flutter. - Reviewed by Dr. Caryl Comes. Not thought to be significant enough for her syncope.  Marland Kitchen KNEE ARTHROSCOPY WITH MEDIAL MENISECTOMY Right 06/24/2014   Procedure: RIGHT KNEE ARTHROSCOPY WITH partial lateral MENISECTOMY, abrasion chondroplasty of medial femoral condyle and patella, microfracture technique;  Surgeon: Latanya Maudlin, MD;  Location: [facility];  Service: Orthopedics;  Laterality: Right;  . LEFT HEART CATHETERIZATION WITH CORONARY ANGIOGRAM N/A 07/06/2011   Procedure: LEFT HEART CATHETERIZATION WITH CORONARY ANGIOGRAM;  Surgeon: Leonie Man, MD;  Location: [facility] CATH LAB: Unstable Angina --> Myoview wiht Inf-Lat Ischemia.  Proximal OM1 lesion --> PCI; 100% mid RCA with right to right and left to right bridging collaterals from circumflex RPL and LAD the PDA.  Marland Kitchen NM MYOVIEW LTD  October 2014    Low risk. Fixed basal inferior artifact normal EF. No ischemia --> (as compared to pre-PCI Myoview revealing inferolateral ischemia)  . rotator cuff Right   . TRANSTHORACIC ECHOCARDIOGRAM  11/2014   EF 65-70%, Mod LVH. Normal wall motion. Gr 1 DD. Normal valves.  Marland Kitchen VAGINAL HYSTERECTOMY      Allergies  Allergen Reactions  . Amlodipine Cough  . Clopidogrel Bisulfate Cough  . Kenalog [Triamcinolone Acetonide] Other (See Comments)    unknown  . Lisinopril Cough  . Losartan Cough  . Sulfa Antibiotics Hives and Rash    Outpatient Encounter Medications as of 10/30/2017  Medication Sig  . acetaminophen (TYLENOL) 650 MG CR tablet Take 650 mg by mouth every 6 (six) hours as needed for pain.  Marland Kitchen atorvastatin (LIPITOR) 80 MG tablet Take 1 tablet (80 mg total) by mouth daily at 6 PM.  . azelastine (ASTELIN) 0.1 % nasal spray Place 2 sprays into both  nostrils at bedtime. Use in each nostril as directed   . docusate sodium (STOOL SOFTENER) 100 MG capsule Take 100 mg by mouth at bedtime.   . fish oil-omega-3 fatty acids 1000 MG capsule Take 1 g by mouth daily.   . furosemide (LASIX) 20 MG tablet Take 20 mg by mouth daily.  Marland Kitchen gabapentin (NEURONTIN) 300 MG capsule Take 300 mg by mouth 3 (three) times daily.   Marland Kitchen glucagon (GLUCAGON EMERGENCY) 1 MG injection Inject 1 mg into the vein once as needed. Give 1 mg IM for Hypoglycemia (symptomatic) not responding to oral glucose, snack. May repeat x 1 after 15 minutes. USE ONLY IF PT IS OR IS BECOMING UNRESPONSIVE  . HUMALOG KWIKPEN 100 UNIT/ML KiwkPen Inject 22 Units into the skin 3 (three) times daily with meals. DM2. Hold for CBG < 200  . Infant Care Products [facility] EX) Apply liberal amount topically to area  of skin irritation prn. OK to leave at bedside.  . Insulin Glargine (TOUJEO MAX SOLOSTAR) 300 UNIT/ML SOPN Inject 75 Units into the skin at bedtime. Alternate abdominal injection sites.  . latanoprost (XALATAN) 0.005 % ophthalmic solution Place 1 drop into both eyes at bedtime.  Marland Kitchen levocetirizine (XYZAL) 5 MG tablet Take 2.5 mg by mouth daily.   . Lidocaine-Menthol (ICY HOT LIDOCAINE PLUS MENTHOL) 4-1 % PTCH Apply 1 patch topically daily. apply to lower back in the a.m. for pain management  . Magnesium 500 MG CAPS Take 500 mg by mouth at bedtime.   . metFORMIN (GLUCOPHAGE-XR) 500 MG 24 hr tablet Take 500 mg by mouth daily with breakfast.   . metoprolol tartrate (LOPRESSOR) 25 MG tablet Take 1 tablet (25 mg total) by mouth 2 (two) times daily.  . nitroGLYCERIN (NITROSTAT) 0.4 MG SL tablet Place 0.4 mg under the tongue every 5 (five) minutes as needed for chest pain.  Marland Kitchen oxybutynin (DITROPAN) 5 MG tablet Take 5 mg by mouth 2 (two) times daily.  . pantoprazole (PROTONIX) 40 MG tablet Take 1 tablet (40 mg total) by mouth daily.  Marland Kitchen PARoxetine (PAXIL) 20 MG tablet Take 1 tablet (20 mg total) by mouth  daily.  . polyethylene glycol (MIRALAX / GLYCOLAX) packet Take 17 g by mouth daily as needed for moderate constipation.  . Rivaroxaban (XARELTO) 15 MG TABS tablet Take 15 mg by mouth every evening.   Marland Kitchen rOPINIRole (REQUIP) 0.5 MG tablet TAKE 1 TABLET BY MOUTH AT  BEDTIME  . timolol (TIMOPTIC) 0.5 % ophthalmic solution Place 1 drop into both eyes at bedtime.   . traMADol (ULTRAM) 50 MG tablet Take 50 mg by mouth every 12 (twelve) hours as needed.  . traMADol (ULTRAM) 50 MG tablet Take 50 mg by mouth every 6 (six) hours.   . trolamine salicylate (ASPERCREME) 10 % cream Apply 1 application topically 3 (three) times daily. Apply to right knee  . UNABLE TO FIND Diet order: upgrade patient to regular consistencies and thin liquids; add extra bowl of gravy.  Diet Type: NAS, NCS  . [DISCONTINUED] nystatin cream (MYCOSTATIN) Apply 1 application topically daily.   No facility-administered encounter medications on file as of 10/30/2017.     Review of Systems  GENERAL: No change in appetite, no fatigue, no weight changes, no fever, chills or weakness MOUTH and THROAT: Denies oral discomfort, gingival pain or bleeding RESPIRATORY: no cough, SOB, DOE, wheezing, hemoptysis CARDIAC: No chest pain, edema or palpitations GI: No abdominal pain, diarrhea, constipation, heart burn, nausea or vomiting GU: Denies dysuria, frequency, hematuria, incontinence, or discharge PSYCHIATRIC: +crying out loud and hallucination  Immunization History  Administered Date(s) Administered  . HPV Bivalent 11/11/2016  . Influenza, High Dose Seasonal PF 10/31/2016  . Influenza,inj,Quad PF,6+ Mos 11/28/2012, 12/08/2014  . Influenza,inj,quad, With Preservative 11/27/2016  . Influenza-Unspecified 12/29/2014, 11/12/2015  . PPD Test 12/29/2014, 07/25/2017  . Pneumococcal Conjugate-13 02/27/2006, 10/31/2016  . Pneumococcal-Unspecified 12/29/2014, 10/28/2016  . Zoster 03/30/2006  . Zoster Recombinat (Shingrix) 10/25/2017    Pertinent  Health Maintenance Due  Topic Date Due  . FOOT EXAM  10/08/1939  . OPHTHALMOLOGY EXAM  08/16/2017  . INFLUENZA VACCINE  09/27/2017  . HEMOGLOBIN A1C  01/22/2018  . URINE MICROALBUMIN  10/10/2018  . DEXA SCAN  Completed  . PNA vac Low Risk Adult  Completed   Fall Risk  02/23/2016 04/12/2015  Falls in the past year? Yes Yes  Number falls in past yr: 1 1  Injury with  Fall? No No  Follow up Falls prevention discussed;Education provided -     Vitals:   10/30/17 1429  BP: (!) 120/50  Pulse: 86  Resp: 12  Temp: 98.1 F (36.7 C)  TempSrc: Oral  SpO2: 96%  Weight: 171 lb 9.6 oz (77.8 kg)  Height: 5\' 4"  (1.626 m)   Body mass index is 29.46 kg/m.  Physical Exam  GENERAL APPEARANCE: Well nourished. In no acute distress. SKIN:  Skin is warm and dry.  MOUTH and THROAT: Lips are without lesions. Oral mucosa is moist and without lesions.  RESPIRATORY: Breathing is even & unlabored, BS CTAB CARDIAC: RRR, no murmur,no extra heart sounds, no edema GI: Abdomen soft, normal BS, no masses, no tenderness EXTREMITIES:  Able to move X 4 extremities PSYCHIATRIC: Alert to self, disoriented to time and place. Affect and behavior are appropriate   Labs reviewed: Recent Labs    07/21/17 1743 07/21/17 1749 07/22/17 1526 07/24/17 0507 10/25/17 0630  NA 136 139 138 144  --   K 4.2 4.2 4.6 4.1  --   CL 95* 94* 98* 109  --   CO2 28  --  24 25  --   GLUCOSE 221* 218* 242* 121*  --   BUN 18 20 14 18   --   CREATININE 1.21* 1.00 0.99 1.13*  --   CALCIUM 9.7  --  9.8 9.4  --   MG  --   --   --   --  2.1   Recent Labs    12/12/16 1642 07/05/17 1229 07/21/17 1743  AST 15 20 23   ALT 17 19 25   ALKPHOS 64 67 85  BILITOT 0.3 0.6 0.4  PROT 7.6 7.9 7.6  ALBUMIN 4.3 4.2 3.6   Recent Labs    04/23/17 1514  07/21/17 1743 07/21/17 1749 07/22/17 0026 07/22/17 1526 07/24/17 0507  WBC 10.0   < > 10.1  --   --  12.3* 10.5  NEUTROABS 8.1*  --  7.6  --   --   --   --   HGB  12.8   < > 13.8 14.6  --  15.0 12.9  HCT 38.7   < > 43.3 43.0 36.2 46.4* 39.5  MCV 94.5   < > 94.5  --   --  94.5 93.8  PLT 287   < > 323  --   --  339 331   < > = values in this interval not displayed.   Lab Results  Component Value Date   TSH 4.194 07/22/2017   Lab Results  Component Value Date   HGBA1C 7.8 (H) 07/22/2017   Lab Results  Component Value Date   CHOL 234 (H) 07/24/2017   HDL 33 (L) 07/24/2017   LDLCALC 157 (H) 07/24/2017   LDLDIRECT 106.0 05/18/2015   TRIG 218 (H) 07/24/2017   CHOLHDL 7.1 07/24/2017    Significant Diagnostic Results in last 30 days:  Dg Op Swallowing Func-medicare/speech Path  Result Date: 10/16/2017 Please refer to "Notes" tab for Speech Pathology notes. CLINICAL DATA:  Oropharyngeal dysphagia EXAM: MODIFIED BARIUM SWALLOW TECHNIQUE: Different consistencies of barium were administered orally to the patient by the Speech Pathologist. Imaging of the pharynx was performed in the lateral projection. FLUOROSCOPY TIME:  Fluoroscopy Time:  2 minutes 12 second Radiation Exposure Index (if provided by the fluoroscopic device): 4.7 mGy Number of Acquired Spot Images: 0 COMPARISON:  None. FINDINGS: Thin liquid-delayed or pharyngeal transfer. Otherwise within normal limits. Nectar thick liquid-  delayed or pharyngeal transfer. Otherwise within normal limits. Pure- delayed or pharyngeal transfer. Otherwise within normal limits. Pure with cracker- delayed or pharyngeal transfer. Otherwise within normal limits. IMPRESSION: Modified barium swallow as described above. Please refer to the Speech Pathologists report for complete details and recommendations. Electronically Signed   By: Kathreen Devoid   On: 10/01/2017 13:47    Assessment/Plan  1. Mood disorder ([place]) -  will start Depakote sprinkles 125 mg 1 capsule twice daily, will get urinalysis with culture and sensitivity to rule out UTI   2. Essential hypertension -well-controlled, continue metoprolol tartrate 25 mg  1 tab twice a day   3. Gastroesophageal reflux disease without esophagitis -stable, continue pantoprazole DR 40 mg 1 tablet daily   4. PAF (paroxysmal atrial fibrillation) ([facility]);  CHA2DS2-VASc Score =6 on 15 mg Xarelto -rate controlled, continue metoprolol tartrate 25 mg 1 tab twice a day and Xarelto 15 mg 1 tab daily   5. Restless leg syndrome -stable, continue ropinirole 0.5 mg 1 tab nightly   6. Diabetes mellitus type 2 with neurological manifestations ([place]) -well-controlled, continue gabapentin 300 mg 1 capsule 3 times a day metformin ER 24-hour 500 mg 1 tab daily, start CBG daily Lab Results  Component Value Date   HGBA1C 7.8 (H) 07/22/2017      Family/ staff Communication:  Discussed plan of care with resident.  Labs/tests ordered: Urinalysis with culture and sensitivity, CMP and CBC  Goals of care:  Long term care    Durenda Age, NP [facility] [phone] (Monday-Friday 8:00 a.m. - 5:00 p.m.) [phone] (after hours)

## 2017-10-31 ENCOUNTER — Other Ambulatory Visit
Admission: RE | Admit: 2017-10-31 | Discharge: 2017-10-31 | Disposition: A | Discharge status: Home / Self Care | Payer: Medicare Other | Source: Ambulatory Visit | Attending: Internal Medicine | Admitting: Internal Medicine

## 2017-10-31 DIAGNOSIS — E1122 Type 2 diabetes mellitus with diabetic chronic kidney disease: Secondary | ICD-10-CM | POA: Diagnosis not present

## 2017-10-31 LAB — COMPREHENSIVE METABOLIC PANEL
ALT: 20 U/L (ref 0–44)
AST: 19 U/L (ref 15–41)
Albumin: 3.2 g/dL — ABNORMAL LOW (ref 3.5–5.0)
Alkaline Phosphatase: 92 U/L (ref 38–126)
Anion gap: 11 (ref 5–15)
BUN: 19 mg/dL (ref 8–23)
CHLORIDE: 99 mmol/L (ref 98–111)
CO2: 28 mmol/L (ref 22–32)
Calcium: 9.1 mg/dL (ref 8.9–10.3)
Creatinine, Ser: 1.02 mg/dL — ABNORMAL HIGH (ref 0.44–1.00)
GFR calc Af Amer: 56 mL/min — ABNORMAL LOW (ref >60)
GFR calc non Af Amer: 48 mL/min — ABNORMAL LOW (ref >60)
GLUCOSE: 170 mg/dL — AB (ref 70–99)
POTASSIUM: 4.1 mmol/L (ref 3.5–5.1)
Sodium: 138 mmol/L (ref 135–145)
Total Bilirubin: 0.6 mg/dL (ref 0.3–1.2)
Total Protein: 6.7 g/dL (ref 6.5–8.1)

## 2017-10-31 LAB — CBC WITH DIFFERENTIAL/PLATELET
BASOS ABS: 0.1 10*3/uL (ref 0–0.1)
Basophils Relative: 1 %
EOS PCT: 3 %
Eosinophils Absolute: 0.3 10*3/uL (ref 0–0.7)
HEMATOCRIT: 32.5 % — AB (ref 35.0–47.0)
Hemoglobin: 11.1 g/dL — ABNORMAL LOW (ref 12.0–16.0)
Lymphocytes Relative: 17 %
Lymphs Abs: 1.7 10*3/uL (ref 1.0–3.6)
MCH: 32 pg (ref 26.0–34.0)
MCHC: 34.1 g/dL (ref 32.0–36.0)
MCV: 93.8 fL (ref 80.0–100.0)
MONO ABS: 0.9 10*3/uL (ref 0.2–0.9)
Monocytes Relative: 9 %
NEUTROS ABS: 7.2 10*3/uL — AB (ref 1.4–6.5)
Neutrophils Relative %: 70 %
PLATELETS: 309 10*3/uL (ref 150–440)
RBC: 3.46 MIL/uL — ABNORMAL LOW (ref 3.80–5.20)
RDW: 15.2 % — AB (ref 11.5–14.5)
WBC: 10.2 10*3/uL (ref 3.6–11.0)

## 2017-11-01 LAB — URINE CULTURE

## 2017-11-02 NOTE — Progress Notes (Signed)
This encounter was created in error - please disregard.

## 2017-11-06 ENCOUNTER — Other Ambulatory Visit: Payer: Self-pay

## 2017-11-06 MED ORDER — TRAMADOL HCL 50 MG PO TABS: 50.0000 mg | ORAL_TABLET | Freq: Four times a day (QID) | ORAL | 0 refills | Status: DC

## 2017-11-06 NOTE — Telephone Encounter (Signed)
Rx sent to [facility] phone : [phone] , fax : [phone]  

## 2017-11-07 ENCOUNTER — Other Ambulatory Visit: Payer: Self-pay

## 2017-11-07 MED ORDER — TRAMADOL HCL 50 MG PO TABS: 50.0000 mg | ORAL_TABLET | Freq: Every evening | ORAL | 0 refills | Status: DC | PRN

## 2017-11-07 NOTE — Telephone Encounter (Signed)
Rx sent to [facility] phone : [phone] , fax : [phone]  

## 2017-11-08 ENCOUNTER — Other Ambulatory Visit
Admission: RE | Admit: 2017-11-08 | Discharge: 2017-11-08 | Disposition: A | Discharge status: Home / Self Care | Payer: Medicare Other | Source: Ambulatory Visit | Attending: Internal Medicine | Admitting: Internal Medicine

## 2017-11-08 DIAGNOSIS — E1122 Type 2 diabetes mellitus with diabetic chronic kidney disease: Secondary | ICD-10-CM | POA: Diagnosis not present

## 2017-11-08 DIAGNOSIS — N189 Chronic kidney disease, unspecified: Secondary | ICD-10-CM | POA: Diagnosis not present

## 2017-11-08 LAB — URINALYSIS, ROUTINE W REFLEX MICROSCOPIC
BILIRUBIN URINE: NEGATIVE
GLUCOSE, UA: NEGATIVE mg/dL
HGB URINE DIPSTICK: NEGATIVE
KETONES UR: NEGATIVE mg/dL
NITRITE: NEGATIVE
PROTEIN: NEGATIVE mg/dL
Specific Gravity, Urine: 1.017 (ref 1.005–1.030)
pH: 7 (ref 5.0–8.0)

## 2017-11-10 LAB — URINE CULTURE

## 2017-11-14 ENCOUNTER — Other Ambulatory Visit: Payer: Self-pay

## 2017-11-14 MED ORDER — TRAMADOL HCL 50 MG PO TABS: 50.0000 mg | ORAL_TABLET | Freq: Three times a day (TID) | ORAL | 0 refills | Status: DC

## 2017-11-14 MED ORDER — TRAMADOL HCL 50 MG PO TABS: 50.0000 mg | ORAL_TABLET | Freq: Every day | ORAL | 0 refills | Status: DC | PRN

## 2017-11-14 NOTE — Telephone Encounter (Signed)
Rx sent to [facility] phone : [phone] , fax : [phone]  

## 2017-11-27 ENCOUNTER — Encounter
Admission: RE | Admit: 2017-11-27 | Discharge: 2017-11-27 | Disposition: A | Discharge status: Home / Self Care | Payer: Medicare Other | Source: Ambulatory Visit | Attending: Internal Medicine | Admitting: Internal Medicine

## 2017-11-29 ENCOUNTER — Encounter: Payer: Self-pay | Admitting: Adult Health

## 2017-11-29 ENCOUNTER — Non-Acute Institutional Stay (SKILLED_NURSING_FACILITY): Payer: Medicare Other | Admitting: Adult Health

## 2017-11-29 DIAGNOSIS — I639 Cerebral infarction, unspecified: Secondary | ICD-10-CM | POA: Diagnosis not present

## 2017-11-29 DIAGNOSIS — E1122 Type 2 diabetes mellitus with diabetic chronic kidney disease: Secondary | ICD-10-CM

## 2017-11-29 DIAGNOSIS — E1169 Type 2 diabetes mellitus with other specified complication: Secondary | ICD-10-CM

## 2017-11-29 DIAGNOSIS — I130 Hypertensive heart and chronic kidney disease with heart failure and stage 1 through stage 4 chronic kidney disease, or unspecified chronic kidney disease: Secondary | ICD-10-CM

## 2017-11-29 DIAGNOSIS — I480 Paroxysmal atrial fibrillation: Secondary | ICD-10-CM | POA: Diagnosis not present

## 2017-11-29 DIAGNOSIS — N182 Chronic kidney disease, stage 2 (mild): Secondary | ICD-10-CM

## 2017-11-29 DIAGNOSIS — I5032 Chronic diastolic (congestive) heart failure: Secondary | ICD-10-CM

## 2017-11-29 DIAGNOSIS — E785 Hyperlipidemia, unspecified: Secondary | ICD-10-CM

## 2017-11-29 DIAGNOSIS — E1149 Type 2 diabetes mellitus with other diabetic neurological complication: Secondary | ICD-10-CM

## 2017-11-29 NOTE — Progress Notes (Signed)
Provider:  Ok Edwards, NP [facility]:  [facility] Room Number: [place] of Service:  SNF (31)   PCP: Leone Haven, MD Patient Care Team: Leone Haven, MD as PCP - General (Family Medicine)  Extended Emergency Contact Information Primary Emergency Contact: [facility] Address: [address]          [place], [place] [address] [name] of [place] Phone: [phone] Work Phone: [phone] Mobile Phone: [phone] Relation: Daughter Secondary Emergency Contact: Marliss Czar, [place] [address] [name] of [place] Phone: [phone] Work Phone: [phone] Mobile Phone: [phone] Relation: Sister  Code Status: full code  Goals of Care: Advanced Directive information Advanced Directives 11/29/2017  Does Patient Have a Medical Advance Directive? No  Type of Advance Directive -  Does patient want to make changes to medical advance directive? -  Copy of [place] in Chart? -  Would patient like information on creating a medical advance directive? No - Patient declined  Pre-existing out of facility DNR order (yellow form or pink MOST form) -      Allergies  Allergen Reactions  . Amlodipine Cough  . Clopidogrel Bisulfate Cough  . Kenalog [Triamcinolone Acetonide] Other (See Comments)    unknown  . Lisinopril Cough  . Losartan Cough  . Sulfa Antibiotics Hives and Rash     Chief Complaint  Patient presents with  . Annual Exam    Yearly Exam    HPI: Patient is a 84 y.o. female seen today for an annual comprehensive examination. She has been hospitalized one time this past June for an cva. She is unable to fully participate in the hpi or ros. There are no reports of chest pain; changes in appetite; no anxiety or agitation. She continues to be followed for her chronic illnesses including: diastolic heart failure; hypertensive heart disease; afib.    Past Medical History:    Diagnosis Date  . Abnormal nuclear stress test 06/21/2011   Inferolateral reversible defect;--> cardiac cath & PCI of CxOM, occluded RCA with collaterals;; followup Myoview 11/2012: Low risk. Fixed basal inferior artifact normal EF. No ischemia  . Anxiety   . Asthma   . Bilateral cataracts    Status post stroke or correction  . CAD S/P percutaneous coronary angioplasty 06/2011   s/P PCI to proximal OM1 w/ DES; occluded RCA with bridging and L-R collaterals (medical management)  . Chronic kidney disease (CKD), stage II (mild)    Related to current bladder infections (although diabetes cannot be excluded)  . Chronotropic incompetence with sinus node dysfunction [facility]) October 2013   On CPET test; beta blockers reduced  . Diabetes mellitus type 2, controlled ([place])    On oral medications  . Diverticulitis   . Essential hypertension    Allowing for permissive hypertension to avoid orthostatic hypotension  . GERD (gastroesophageal reflux disease)    On PPI  . Glaucoma   . History of syncope    Per EP - neurocardiogenic & not Bradycardia related (no PPM)  . History of unstable angina 06/13/2011   Jaw pain awakening from sleep -- Myoview --CATH --> PCI  . Hyperlipidemia with target LDL less than 70    HDL at goal, LDL not at goal, borderline triglycerides. On Crestor 20 mg  . Migraines   . OSA (obstructive sleep apnea)    hx bladder infections  . Osteoarthritis   . PAF (paroxysmal atrial fibrillation) (  [place]) 10-11/ 2014   CardioNet Event Monitor: NSR & S Brady -- Rates 50-100; Total A. fib burden 35 hours and 27 minutes. 1296 episodes, longest was 1 hour 29 minutes. Rate ranged from 52-169 beats per minute.  . Seasonal allergies   . Shortness of breath on exertion October 2013   2-D echo: Normal EF>55%, Gr 1DD, mild aortic sclerosis; Evaluated with CPET - peak VO2 97%; Chronotropic Incompetence (submaximal effort)  . Spinal stenosis of lumbar region 11/2011  . Stroke ([place])   .  Tachycardia-bradycardia syndrome ([place]) 11/2012  . Urge incontinence    Past Surgical History:  Procedure Laterality Date  . APPENDECTOMY    . BREAST BIOPSY     both breast  . cataracts     both eyes  . COLONOSCOPY    . CORONARY ANGIOPLASTY WITH STENT PLACEMENT  07/05/2011   Promus Element DES 2.5 mm x 16 mm-post dilated to 2.65 mm CX-Proximal OM1  . Holter Monitor  04/2015   Sinus rhythm with rates 52-149 BPM. Isolated PACs with rare couplets and bigeminy. Multiple short runs of PAT/PSVT. Arrhythmia run 120 bpm for 204 beats. 14% of time was in A. fib/flutter. - Reviewed by Dr. Caryl Comes. Not thought to be significant enough for her syncope.  Marland Kitchen KNEE ARTHROSCOPY WITH MEDIAL MENISECTOMY Right 06/24/2014   Procedure: RIGHT KNEE ARTHROSCOPY WITH partial lateral MENISECTOMY, abrasion chondroplasty of medial femoral condyle and patella, microfracture technique;  Surgeon: Latanya Maudlin, MD;  Location: [facility];  Service: Orthopedics;  Laterality: Right;  . LEFT HEART CATHETERIZATION WITH CORONARY ANGIOGRAM N/A 07/06/2011   Procedure: LEFT HEART CATHETERIZATION WITH CORONARY ANGIOGRAM;  Surgeon: Leonie Man, MD;  Location: [facility] CATH LAB: Unstable Angina --> Myoview wiht Inf-Lat Ischemia.  Proximal OM1 lesion --> PCI; 100% mid RCA with right to right and left to right bridging collaterals from circumflex RPL and LAD the PDA.  Marland Kitchen NM MYOVIEW LTD  October 2014    Low risk. Fixed basal inferior artifact normal EF. No ischemia --> (as compared to pre-PCI Myoview revealing inferolateral ischemia)  . rotator cuff Right   . TRANSTHORACIC ECHOCARDIOGRAM  11/2014   EF 65-70%, Mod LVH. Normal wall motion. Gr 1 DD. Normal valves.  Marland Kitchen VAGINAL HYSTERECTOMY      reports that she has never smoked. She has never used smokeless tobacco. She reports that she does not drink alcohol or use drugs. Social History   Socioeconomic History  . Marital status: Widowed    Spouse name: Not on file  . Number of children: Not on file   . Years of education: Not on file  . Highest education level: Not on file  Occupational History  . Occupation: retired  Scientific laboratory technician  . Financial resource strain: Not hard at all  . Food insecurity:    Worry: Patient refused    Inability: Patient refused  . Transportation needs:    Medical: Patient refused    Non-medical: Patient refused  Tobacco Use  . Smoking status: Never Smoker  . Smokeless tobacco: Never Used  Substance and Sexual Activity  . Alcohol use: No  . Drug use: No  . Sexual activity: Never    Birth control/protection: Post-menopausal  Lifestyle  . Physical activity:    Days per week: Patient refused    Minutes per session: Patient refused  . Stress: Only a little  Relationships  . Social connections:    Talks on phone: Patient refused    Gets together: Patient refused  Attends religious service: Patient refused    Active member of club or organization: Patient refused    Attends meetings of clubs or organizations: Patient refused    Relationship status: Patient refused  . Intimate partner violence:    Fear of current or ex partner: Patient refused    Emotionally abused: Patient refused    Physically abused: Patient refused    Forced sexual activity: Patient refused  Other Topics Concern  . Not on file  Social History Narrative   Widowed mother of one, grandmother 2. Has not been using silver take as much lately due to knee discomfort. Usually exercises with Silver Sneakers on regular basis.   Family History  Problem Relation Age of Onset  . COPD Mother   . Pulmonary fibrosis Mother   . Heart attack Father   . Heart disease Father   . Stroke Father   . Cancer Sister   . Cancer Sister   . Clotting disorder Sister   . Heart attack Sister   . Arthritis Unknown   . Breast cancer Sister   . Colon cancer Neg Hx   . Esophageal cancer Neg Hx   . Stomach cancer Neg Hx     Vitals:   11/29/17 1506  BP: (!) 146/65  Pulse: 84  Resp: 16  Temp:  97.6 F (36.4 C)  SpO2: 97%  Weight: 168 lb 3.2 oz (76.3 kg)  Height: 5\' 4"  (1.626 m)   Body mass index is 28.87 kg/m.  Allergies as of 11/29/2017      Reactions   Amlodipine Cough   Clopidogrel Bisulfate Cough   Kenalog [triamcinolone Acetonide] Other (See Comments)   unknown   Lisinopril Cough   Losartan Cough   Sulfa Antibiotics Hives, Rash      Medication List        Accurate as of 11/29/17  3:17 PM. Always use your most recent med list.          acetaminophen 650 MG CR tablet Commonly known as:  TYLENOL Take 650 mg by mouth every 6 (six) hours as needed for pain.   atorvastatin 80 MG tablet Commonly known as:  LIPITOR Take 1 tablet (80 mg total) by mouth daily at 6 PM.   azelastine 0.1 % nasal spray Commonly known as:  ASTELIN Place 2 sprays into both nostrils at bedtime. Use in each nostril as directed   DERMACLOUD EX Apply liberal amount topically to area of skin irritation prn. OK to leave at bedside.   divalproex 125 MG capsule Commonly known as:  DEPAKOTE SPRINKLE Take 125 mg by mouth 2 (two) times daily.   fish oil-omega-3 fatty acids 1000 MG capsule Take 1 g by mouth daily.   furosemide 20 MG tablet Commonly known as:  LASIX Take 20 mg by mouth daily.   gabapentin 300 MG capsule Commonly known as:  NEURONTIN Take 300 mg by mouth 3 (three) times daily.   GLUCAGON EMERGENCY 1 MG injection Generic drug:  glucagon Inject 1 mg into the vein once as needed. Give 1 mg IM for Hypoglycemia (symptomatic) not responding to oral glucose, snack. May repeat x 1 after 15 minutes. USE ONLY IF PT IS OR IS BECOMING UNRESPONSIVE   HUMALOG KWIKPEN 100 UNIT/ML KiwkPen Generic drug:  insulin lispro Inject 22 Units into the skin 3 (three) times daily with meals. DM2. Hold for CBG < 200   ICY HOT LIDOCAINE PLUS MENTHOL 4-1 % Ptch Generic drug:  Lidocaine-Menthol Apply 1 patch topically daily.  apply to lower back in the a.m. for pain management   latanoprost  0.005 % ophthalmic solution Commonly known as:  XALATAN Place 1 drop into both eyes at bedtime.   levocetirizine 5 MG tablet Commonly known as:  XYZAL Take 2.5 mg by mouth daily.   Magnesium 500 MG Caps Take 500 mg by mouth at bedtime.   metFORMIN 500 MG 24 hr tablet Commonly known as:  GLUCOPHAGE-XR Take 500 mg by mouth daily with breakfast.   metoprolol tartrate 25 MG tablet Commonly known as:  LOPRESSOR Take 1 tablet (25 mg total) by mouth 2 (two) times daily.   nitroGLYCERIN 0.4 MG SL tablet Commonly known as:  NITROSTAT Place 0.4 mg under the tongue every 5 (five) minutes as needed for chest pain.   oxybutynin 5 MG tablet Commonly known as:  DITROPAN Take 5 mg by mouth 2 (two) times daily.   pantoprazole 40 MG tablet Commonly known as:  PROTONIX Take 1 tablet (40 mg total) by mouth daily.   PARoxetine 20 MG tablet Commonly known as:  PAXIL Take 1 tablet (20 mg total) by mouth daily.   polyethylene glycol packet Commonly known as:  MIRALAX / GLYCOLAX Take 17 g by mouth daily as needed for moderate constipation.   Rivaroxaban 15 MG Tabs tablet Commonly known as:  XARELTO Take 15 mg by mouth every evening.   rOPINIRole 0.5 MG tablet Commonly known as:  REQUIP TAKE 1 TABLET BY MOUTH AT  BEDTIME   STOOL SOFTENER 100 MG capsule Generic drug:  docusate sodium Take 100 mg by mouth at bedtime.   timolol 0.5 % ophthalmic solution Commonly known as:  TIMOPTIC Place 1 drop into both eyes at bedtime.   TOUJEO MAX SOLOSTAR 300 UNIT/ML Sopn Generic drug:  Insulin Glargine Inject 75 Units into the skin at bedtime. Alternate abdominal injection sites.   traMADol 50 MG tablet Commonly known as:  ULTRAM Take 1 tablet (50 mg total) by mouth 3 (three) times daily. At 6 am 12 pm and 10 pm   traMADol 50 MG tablet Commonly known as:  ULTRAM Take 1 tablet (50 mg total) by mouth daily as needed.   trolamine salicylate 10 % cream Commonly known as:  ASPERCREME Apply 1  application topically 3 (three) times daily. Apply to right knee   UNABLE TO FIND Diet order: upgrade patient to regular consistencies and thin liquids; add extra bowl of gravy.  Diet Type: NAS, NCS        SIGNIFICANT DIAGNOSTIC EXAMS  LABS REVIEWED: PREVIOUS  07-21-17: wbc 10.1; hgb 13.8; hct 43.8; mcv 94.5; plt 323 glucose 221; bun 18; creat 1.21; k+ 4.2; na++ 136; ca 9.7 liver normal albumin 3.6  07-22-17: hgb a1c 7.8 tsh 4.194 vit B 12: 342 07-24-17: wbc 10.5; hgb 12.9; hct 39.5; mcv 93.8 plt 331; glucose 121 bun 18; creat 1.13;  4.1; na++ 144; ca 9.4 chol 234; ldl 157; trig 218; hdl 33   TODAY:   10-09-17: urine micro-albumin: 5.9 10-25-17: mag 2.1  10-31-17: wbc 10.2; hgb 11.1; hct 32.5 ;mcv 93.8; plt 309 glucose 170; bun 19; creat 1.02; k+ 4.1; na++138; ca 9.1; liver normal albumin 3.2 urine culture: multiple species  11-08-17: urine culture: e-coli    Review of Systems  Unable to perform ROS: Other (poor historian )   Physical Exam  Constitutional: She appears well-developed and well-nourished. No distress.  Neck: Thyromegaly present.  Mild thyroid enlargement   Cardiovascular: Normal rate, regular rhythm, normal heart sounds and  intact distal pulses.  Pulmonary/Chest: Effort normal and breath sounds normal. No respiratory distress.  Abdominal: Soft. Bowel sounds are normal. She exhibits no distension. There is no tenderness.  Musculoskeletal: She exhibits no edema.  Has generalized weakness present.  Is able to move all extremities    Lymphadenopathy:    She has no cervical adenopathy.  Neurological: She is alert.  Skin: Skin is warm and dry. She is not diaphoretic.  Psychiatric: She has a normal mood and affect.    ASSESSMENT/ PLAN:  TODAY:   1. Hypertensive heart disease with congestive heart failure and stage 2 kidney disease: is stable b/p 146/65: will continue lopressor 25 mg twice daily   2. PAF( paroxymal atrial fibrillation) CHA2DS2- VASc score+6: heart  rate is stable; will continue lopressor 25 mg twice daily for rate control and xarelto 15 mg daily   3.  Chronic diastolic heart failure: is stable will continue lasix 20 mg daily   4. Chronic bilateral lower back pain with right sided sciatica: is worse; will change her ultram to 50 mg every 6 hours routinely and lidoderm patch to her back daily   5. CAD-PCI OM1, 100% RCA (l-R collaterals): is stable will continue lopressor 25 mg twice daily has prn ntg is on statin  6. Dyslipidemia associated with type 2 diabetes mellitus: stable LDL 157 trig 218 : will continue lipitor 80 mg daily and fish oil 1 gm daily   7.  Diabetes mellitus type 2 with neurological manifestations: is stable hgb a1c 7.8: will continue humalog 22 units with meals and toujeo 75 units nightly metformin xr 500 mg daily   8. Acute CVA: is neurologically stable will continue xarelto 15 mg daily   9. Diabetic neuropathy: is stable will continue neurontin 300 mg three times daily   10.  GERD without esophagitis: stable will continue protonix 40 mg daily   11.  Chronic constipation: is stable will continue colace nightly and miralax daily as needed  12.  Allergic rhinitis: is stable will continue astelin nasal spray nightly and xyzal 2.5 mg daily   13. UI: without change: will continue ditropan 5 mg twice daily   14.  Bilateral chronic glaucoma: is stable will continue timoptic to both eyes and lumigan to both eyes.   15. PLMD (periodic limb movement disorder) is stable will continue requip 0.5 mg nightly   16. Anxiety: is stable will continue xanax 0.25 mg nightly as needed   17. CKD stage 2 due to type 2 diabetes mellitus: is stable bun 19; creat 1.02    Health maintenance is up to date.   MD is aware of resident's narcotic use and is in agreement with current plan of care. We will wean dosage as appropriate for resident   Ok Edwards NP [facility]  Contact [phone] Monday through Friday 8am-  5pm  After hours call [phone]

## 2017-12-18 DIAGNOSIS — K219 Gastro-esophageal reflux disease without esophagitis: Secondary | ICD-10-CM | POA: Diagnosis not present

## 2017-12-18 DIAGNOSIS — I69320 Aphasia following cerebral infarction: Secondary | ICD-10-CM | POA: Diagnosis not present

## 2017-12-18 DIAGNOSIS — I69351 Hemiplegia and hemiparesis following cerebral infarction affecting right dominant side: Secondary | ICD-10-CM | POA: Diagnosis not present

## 2017-12-20 DIAGNOSIS — I69320 Aphasia following cerebral infarction: Secondary | ICD-10-CM | POA: Diagnosis not present

## 2017-12-20 DIAGNOSIS — I69351 Hemiplegia and hemiparesis following cerebral infarction affecting right dominant side: Secondary | ICD-10-CM | POA: Diagnosis not present

## 2017-12-20 DIAGNOSIS — K219 Gastro-esophageal reflux disease without esophagitis: Secondary | ICD-10-CM | POA: Diagnosis not present

## 2017-12-21 ENCOUNTER — Encounter (HOSPITAL_COMMUNITY): Payer: Self-pay | Admitting: Emergency Medicine

## 2017-12-21 ENCOUNTER — Emergency Department (HOSPITAL_COMMUNITY): Payer: Medicare Other

## 2017-12-21 ENCOUNTER — Inpatient Hospital Stay (HOSPITAL_COMMUNITY): Payer: Medicare Other

## 2017-12-21 ENCOUNTER — Inpatient Hospital Stay (HOSPITAL_COMMUNITY)
Admission: EM | Admit: 2017-12-21 | Discharge: 2017-12-27 | DRG: 101 | Disposition: A | Discharge status: Skilled Nursing Facility | Payer: Medicare Other | Attending: Internal Medicine | Admitting: Internal Medicine

## 2017-12-21 DIAGNOSIS — G4733 Obstructive sleep apnea (adult) (pediatric): Secondary | ICD-10-CM | POA: Diagnosis present

## 2017-12-21 DIAGNOSIS — M6281 Muscle weakness (generalized): Secondary | ICD-10-CM | POA: Diagnosis not present

## 2017-12-21 DIAGNOSIS — N182 Chronic kidney disease, stage 2 (mild): Secondary | ICD-10-CM | POA: Diagnosis present

## 2017-12-21 DIAGNOSIS — R4701 Aphasia: Secondary | ICD-10-CM | POA: Diagnosis not present

## 2017-12-21 DIAGNOSIS — Z955 Presence of coronary angioplasty implant and graft: Secondary | ICD-10-CM

## 2017-12-21 DIAGNOSIS — G40909 Epilepsy, unspecified, not intractable, without status epilepticus: Secondary | ICD-10-CM | POA: Diagnosis present

## 2017-12-21 DIAGNOSIS — G9340 Encephalopathy, unspecified: Secondary | ICD-10-CM | POA: Diagnosis not present

## 2017-12-21 DIAGNOSIS — I480 Paroxysmal atrial fibrillation: Secondary | ICD-10-CM | POA: Diagnosis present

## 2017-12-21 DIAGNOSIS — F419 Anxiety disorder, unspecified: Secondary | ICD-10-CM | POA: Diagnosis present

## 2017-12-21 DIAGNOSIS — Z66 Do not resuscitate: Secondary | ICD-10-CM | POA: Diagnosis present

## 2017-12-21 DIAGNOSIS — M8580 Other specified disorders of bone density and structure, unspecified site: Secondary | ICD-10-CM | POA: Diagnosis present

## 2017-12-21 DIAGNOSIS — E860 Dehydration: Secondary | ICD-10-CM | POA: Diagnosis not present

## 2017-12-21 DIAGNOSIS — I25119 Atherosclerotic heart disease of native coronary artery with unspecified angina pectoris: Secondary | ICD-10-CM | POA: Diagnosis not present

## 2017-12-21 DIAGNOSIS — R4182 Altered mental status, unspecified: Secondary | ICD-10-CM

## 2017-12-21 DIAGNOSIS — R29810 Facial weakness: Secondary | ICD-10-CM | POA: Diagnosis not present

## 2017-12-21 DIAGNOSIS — Z515 Encounter for palliative care: Secondary | ICD-10-CM | POA: Diagnosis not present

## 2017-12-21 DIAGNOSIS — I69354 Hemiplegia and hemiparesis following cerebral infarction affecting left non-dominant side: Secondary | ICD-10-CM | POA: Diagnosis not present

## 2017-12-21 DIAGNOSIS — R279 Unspecified lack of coordination: Secondary | ICD-10-CM | POA: Diagnosis not present

## 2017-12-21 DIAGNOSIS — H409 Unspecified glaucoma: Secondary | ICD-10-CM | POA: Diagnosis present

## 2017-12-21 DIAGNOSIS — E785 Hyperlipidemia, unspecified: Secondary | ICD-10-CM | POA: Diagnosis present

## 2017-12-21 DIAGNOSIS — E1122 Type 2 diabetes mellitus with diabetic chronic kidney disease: Secondary | ICD-10-CM | POA: Diagnosis present

## 2017-12-21 DIAGNOSIS — Z888 Allergy status to other drugs, medicaments and biological substances status: Secondary | ICD-10-CM

## 2017-12-21 DIAGNOSIS — E1149 Type 2 diabetes mellitus with other diabetic neurological complication: Secondary | ICD-10-CM | POA: Diagnosis present

## 2017-12-21 DIAGNOSIS — I10 Essential (primary) hypertension: Secondary | ICD-10-CM | POA: Diagnosis present

## 2017-12-21 DIAGNOSIS — M48061 Spinal stenosis, lumbar region without neurogenic claudication: Secondary | ICD-10-CM | POA: Diagnosis not present

## 2017-12-21 DIAGNOSIS — Z882 Allergy status to sulfonamides status: Secondary | ICD-10-CM

## 2017-12-21 DIAGNOSIS — R0902 Hypoxemia: Secondary | ICD-10-CM | POA: Diagnosis not present

## 2017-12-21 DIAGNOSIS — G4761 Periodic limb movement disorder: Secondary | ICD-10-CM | POA: Diagnosis not present

## 2017-12-21 DIAGNOSIS — K219 Gastro-esophageal reflux disease without esophagitis: Secondary | ICD-10-CM | POA: Diagnosis present

## 2017-12-21 DIAGNOSIS — I4891 Unspecified atrial fibrillation: Secondary | ICD-10-CM | POA: Diagnosis not present

## 2017-12-21 DIAGNOSIS — H518 Other specified disorders of binocular movement: Secondary | ICD-10-CM | POA: Diagnosis not present

## 2017-12-21 DIAGNOSIS — I25110 Atherosclerotic heart disease of native coronary artery with unstable angina pectoris: Secondary | ICD-10-CM | POA: Diagnosis present

## 2017-12-21 DIAGNOSIS — D649 Anemia, unspecified: Secondary | ICD-10-CM | POA: Diagnosis not present

## 2017-12-21 DIAGNOSIS — R1310 Dysphagia, unspecified: Secondary | ICD-10-CM

## 2017-12-21 DIAGNOSIS — H5500 Unspecified nystagmus: Secondary | ICD-10-CM | POA: Diagnosis not present

## 2017-12-21 DIAGNOSIS — I129 Hypertensive chronic kidney disease with stage 1 through stage 4 chronic kidney disease, or unspecified chronic kidney disease: Secondary | ICD-10-CM | POA: Diagnosis not present

## 2017-12-21 DIAGNOSIS — I639 Cerebral infarction, unspecified: Secondary | ICD-10-CM

## 2017-12-21 DIAGNOSIS — I69320 Aphasia following cerebral infarction: Secondary | ICD-10-CM | POA: Diagnosis not present

## 2017-12-21 DIAGNOSIS — R29719 NIHSS score 19: Secondary | ICD-10-CM | POA: Diagnosis present

## 2017-12-21 DIAGNOSIS — Z7901 Long term (current) use of anticoagulants: Secondary | ICD-10-CM

## 2017-12-21 DIAGNOSIS — N183 Chronic kidney disease, stage 3 (moderate): Secondary | ICD-10-CM | POA: Diagnosis not present

## 2017-12-21 DIAGNOSIS — Z79899 Other long term (current) drug therapy: Secondary | ICD-10-CM

## 2017-12-21 DIAGNOSIS — Z7189 Other specified counseling: Secondary | ICD-10-CM | POA: Diagnosis not present

## 2017-12-21 DIAGNOSIS — Z794 Long term (current) use of insulin: Secondary | ICD-10-CM | POA: Diagnosis not present

## 2017-12-21 DIAGNOSIS — R1312 Dysphagia, oropharyngeal phase: Secondary | ICD-10-CM | POA: Diagnosis not present

## 2017-12-21 DIAGNOSIS — N3281 Overactive bladder: Secondary | ICD-10-CM | POA: Diagnosis not present

## 2017-12-21 DIAGNOSIS — R29818 Other symptoms and signs involving the nervous system: Secondary | ICD-10-CM | POA: Diagnosis not present

## 2017-12-21 DIAGNOSIS — I679 Cerebrovascular disease, unspecified: Secondary | ICD-10-CM | POA: Diagnosis not present

## 2017-12-21 DIAGNOSIS — J302 Other seasonal allergic rhinitis: Secondary | ICD-10-CM | POA: Diagnosis not present

## 2017-12-21 DIAGNOSIS — Z7401 Bed confinement status: Secondary | ICD-10-CM | POA: Diagnosis not present

## 2017-12-21 DIAGNOSIS — I5032 Chronic diastolic (congestive) heart failure: Secondary | ICD-10-CM | POA: Diagnosis not present

## 2017-12-21 DIAGNOSIS — Z9114 Patient's other noncompliance with medication regimen: Secondary | ICD-10-CM

## 2017-12-21 DIAGNOSIS — E876 Hypokalemia: Secondary | ICD-10-CM | POA: Diagnosis not present

## 2017-12-21 DIAGNOSIS — M2550 Pain in unspecified joint: Secondary | ICD-10-CM | POA: Diagnosis not present

## 2017-12-21 DIAGNOSIS — I130 Hypertensive heart and chronic kidney disease with heart failure and stage 1 through stage 4 chronic kidney disease, or unspecified chronic kidney disease: Secondary | ICD-10-CM | POA: Diagnosis not present

## 2017-12-21 LAB — DIFFERENTIAL
Abs Immature Granulocytes: 0.09 10*3/uL — ABNORMAL HIGH (ref 0.00–0.07)
Basophils Absolute: 0.1 10*3/uL (ref 0.0–0.1)
Basophils Relative: 1 %
Eosinophils Absolute: 0.3 10*3/uL (ref 0.0–0.5)
Eosinophils Relative: 3 %
Immature Granulocytes: 1 %
LYMPHS ABS: 2.2 10*3/uL (ref 0.7–4.0)
LYMPHS PCT: 21 %
MONO ABS: 0.9 10*3/uL (ref 0.1–1.0)
MONOS PCT: 9 %
NEUTROS ABS: 6.9 10*3/uL (ref 1.7–7.7)
Neutrophils Relative %: 65 %

## 2017-12-21 LAB — COMPREHENSIVE METABOLIC PANEL
ALK PHOS: 114 U/L (ref 38–126)
ALT: 29 U/L (ref 0–44)
AST: 28 U/L (ref 15–41)
Albumin: 3.1 g/dL — ABNORMAL LOW (ref 3.5–5.0)
Anion gap: 13 (ref 5–15)
BUN: 16 mg/dL (ref 8–23)
CALCIUM: 9.7 mg/dL (ref 8.9–10.3)
CO2: 27 mmol/L (ref 22–32)
CREATININE: 1.22 mg/dL — AB (ref 0.44–1.00)
Chloride: 100 mmol/L (ref 98–111)
GFR calc non Af Amer: 39 mL/min — ABNORMAL LOW (ref >60)
GFR, EST AFRICAN AMERICAN: 45 mL/min — AB (ref >60)
Glucose, Bld: 87 mg/dL (ref 70–99)
Potassium: 3.9 mmol/L (ref 3.5–5.1)
SODIUM: 140 mmol/L (ref 135–145)
Total Bilirubin: 0.2 mg/dL — ABNORMAL LOW (ref 0.3–1.2)
Total Protein: 6.6 g/dL (ref 6.5–8.1)

## 2017-12-21 LAB — CBC
HEMATOCRIT: 36.8 % (ref 36.0–46.0)
Hemoglobin: 11.5 g/dL — ABNORMAL LOW (ref 12.0–15.0)
MCH: 31.3 pg (ref 26.0–34.0)
MCHC: 31.3 g/dL (ref 30.0–36.0)
MCV: 100.3 fL — AB (ref 80.0–100.0)
Platelets: 317 10*3/uL (ref 150–400)
RBC: 3.67 MIL/uL — ABNORMAL LOW (ref 3.87–5.11)
RDW: 12.8 % (ref 11.5–15.5)
WBC: 10.4 10*3/uL (ref 4.0–10.5)
nRBC: 0 % (ref 0.0–0.2)

## 2017-12-21 LAB — I-STAT CHEM 8, ED
BUN: 19 mg/dL (ref 8–23)
CREATININE: 1.3 mg/dL — AB (ref 0.44–1.00)
Calcium, Ion: 1.16 mmol/L (ref 1.15–1.40)
Chloride: 99 mmol/L (ref 98–111)
GLUCOSE: 89 mg/dL (ref 70–99)
HCT: 35 % — ABNORMAL LOW (ref 36.0–46.0)
HEMOGLOBIN: 11.9 g/dL — AB (ref 12.0–15.0)
POTASSIUM: 3.9 mmol/L (ref 3.5–5.1)
Sodium: 140 mmol/L (ref 135–145)
TCO2: 29 mmol/L (ref 22–32)

## 2017-12-21 LAB — CBG MONITORING, ED: Glucose-Capillary: 83 mg/dL (ref 70–99)

## 2017-12-21 LAB — PROTIME-INR
INR: 1.22
Prothrombin Time: 15.3 seconds — ABNORMAL HIGH (ref 11.4–15.2)

## 2017-12-21 LAB — I-STAT TROPONIN, ED: Troponin i, poc: 0 ng/mL (ref 0.00–0.08)

## 2017-12-21 LAB — APTT: aPTT: 33 seconds (ref 24–36)

## 2017-12-21 MED ORDER — LEVETIRACETAM IN NACL 500 MG/100ML IV SOLN
500.0000 mg | Freq: Two times a day (BID) | INTRAVENOUS | Status: DC
  Administered 2017-12-22 – 2017-12-23 (×4): 500 mg via INTRAVENOUS
  Filled 2017-12-21 (×4): qty 100

## 2017-12-21 MED ORDER — LEVETIRACETAM IN NACL 1000 MG/100ML IV SOLN
1000.0000 mg | INTRAVENOUS | Status: AC
  Administered 2017-12-21: 1000 mg via INTRAVENOUS
  Filled 2017-12-21: qty 100

## 2017-12-21 MED ORDER — LORAZEPAM 2 MG/ML IJ SOLN
INTRAMUSCULAR | Status: AC
  Filled 2017-12-21: qty 1

## 2017-12-21 NOTE — ED Provider Notes (Addendum)
[facility] EMERGENCY DEPARTMENT Provider Note   CSN: [id number] Arrival date & time: 12/21/17  1740   An emergency department physician performed an initial assessment on this suspected stroke patient at 1746.  History   Chief Complaint Chief Complaint  Patient presents with  . Code Stroke    HPI Abigail Boyd is a 84 y.o. female.  Level 5 caveat for urgent need for intervention.  Patient has multiple health problems well documented in the past medical history.  Nursing home staff reports last normal at approximately 1530 today.  She allegedly had a "right facial droop, right arm drift, aphasia, and gaze to the right".  No family members present to confirm history.  Medical history includes strokes in the past, CAD, CKD, many others.     Past Medical History:  Diagnosis Date  . Abnormal nuclear stress test 06/21/2011   Inferolateral reversible defect;--> cardiac cath & PCI of CxOM, occluded RCA with collaterals;; followup Myoview 11/2012: Low risk. Fixed basal inferior artifact normal EF. No ischemia  . Anxiety   . Asthma   . Bilateral cataracts    Status post stroke or correction  . CAD S/P percutaneous coronary angioplasty 06/2011   s/P PCI to proximal OM1 w/ DES; occluded RCA with bridging and L-R collaterals (medical management)  . Chronic kidney disease (CKD), stage II (mild)    Related to current bladder infections (although diabetes cannot be excluded)  . Chronotropic incompetence with sinus node dysfunction [facility]) October 2013   On CPET test; beta blockers reduced  . Diabetes mellitus type 2, controlled ([place])    On oral medications  . Diverticulitis   . Essential hypertension    Allowing for permissive hypertension to avoid orthostatic hypotension  . GERD (gastroesophageal reflux disease)    On PPI  . Glaucoma   . History of syncope    Per EP - neurocardiogenic & not Bradycardia related (no PPM)  . History of unstable angina 06/13/2011   Jaw  pain awakening from sleep -- Myoview --CATH --> PCI  . Hyperlipidemia with target LDL less than 70    HDL at goal, LDL not at goal, borderline triglycerides. On Crestor 20 mg  . Migraines   . OSA (obstructive sleep apnea)    hx bladder infections  . Osteoarthritis   . PAF (paroxysmal atrial fibrillation) ([place]) 10-11/ 2014   CardioNet Event Monitor: NSR & S Brady -- Rates 50-100; Total A. fib burden 35 hours and 27 minutes. 1296 episodes, longest was 1 hour 29 minutes. Rate ranged from 52-169 beats per minute.  . Seasonal allergies   . Shortness of breath on exertion October 2013   2-D echo: Normal EF>55%, Gr 1DD, mild aortic sclerosis; Evaluated with CPET - peak VO2 97%; Chronotropic Incompetence (submaximal effort)  . Spinal stenosis of lumbar region 11/2011  . Stroke ([place]) 06/2017  . Stroke ([place]) 11/2014  . Tachycardia-bradycardia syndrome ([place]) 11/2012  . Urge incontinence     Patient Active Problem List   Diagnosis Date Noted  . Chronic bilateral low back pain with right-sided sciatica 10/15/2017  . Hypertensive heart disease with congestive heart failure and stage 2 kidney disease ([place]) 09/06/2017  . Diabetes mellitus type 2 with neurological manifestations ([place]) 09/06/2017  . Diabetic neuropathy ([place]) 09/06/2017  . UI (urinary incontinence) 09/06/2017  . Chronic glaucoma 09/06/2017  . Acute CVA (cerebrovascular accident) ([place]) 07/23/2017  . Acute encephalopathy 07/21/2017  . Chronic diastolic CHF (congestive heart failure) ([place]) 07/21/2017  . Stroke (  [place]) 06/27/2017  . Generalized weakness 04/23/2017  . Recurrent UTI 04/23/2017  . Anxiety 12/13/2015  . Allergic rhinitis 12/13/2015  . Urge incontinence 03/15/2015  . Constipation 03/15/2015  . Right knee pain 03/15/2015  . CKD stage 2 due to type 2 diabetes mellitus ([place])   . PLMD (periodic limb movement disorder) 06/12/2013  . Obstructive sleep apnea 03/05/2013  . Tachy-brady syndrome: Rates range from 50-169 bpm in A.  fib 12/20/2012  . History of syncope: Unclear etiology 12/19/2012  . PAF (paroxysmal atrial fibrillation) (HCC);  CHA2DS2-VASc Score =6 on 15 mg Xarelto 05/16/2012    Class: Diagnosis of  . Long term current use of anticoagulant therapy 05/16/2012  . CAD-PCI OM1, 100% RCA (L-R colllaterals) 07/07/2011  . Essential hypertension     Class: Diagnosis of  . Dyslipidemia associated with type 2 diabetes mellitus (HCC)     Class: Diagnosis of  . Benign endometrial hyperplasia 03/21/2011  . Diverticulitis 03/21/2011  . GERD (gastroesophageal reflux disease) 03/21/2011  . Glaucoma (increased eye pressure) 03/21/2011  . Goiter 03/21/2011  . History of carpal tunnel syndrome 03/21/2011  . Osteopenia 03/21/2011  . Vitamin D deficiency disease 03/21/2011    Past Surgical History:  Procedure Laterality Date  . APPENDECTOMY    . BREAST BIOPSY     both breast  . cataracts     both eyes  . COLONOSCOPY    . CORONARY ANGIOPLASTY WITH STENT PLACEMENT  07/05/2011   Promus Element DES 2.5 mm x 16 mm-post dilated to 2.65 mm CX-Proximal OM1  . Holter Monitor  04/2015   Sinus rhythm with rates 52-149 BPM. Isolated PACs with rare couplets and bigeminy. Multiple short runs of PAT/PSVT. Arrhythmia run 120 bpm for 204 beats. 14% of time was in A. fib/flutter. - Reviewed by Dr. Caryl Comes. Not thought to be significant enough for her syncope.  Marland Kitchen KNEE ARTHROSCOPY WITH MEDIAL MENISECTOMY Right 06/24/2014   Procedure: RIGHT KNEE ARTHROSCOPY WITH partial lateral MENISECTOMY, abrasion chondroplasty of medial femoral condyle and patella, microfracture technique;  Surgeon: Latanya Maudlin, MD;  Location: [facility] ORS;  Service: Orthopedics;  Laterality: Right;  . LEFT HEART CATHETERIZATION WITH CORONARY ANGIOGRAM N/A 07/06/2011   Procedure: LEFT HEART CATHETERIZATION WITH CORONARY ANGIOGRAM;  Surgeon: Leonie Man, MD;  Location: [facility] CATH LAB: Unstable Angina --> Myoview wiht Inf-Lat Ischemia.  Proximal OM1 lesion --> PCI; 100% mid  RCA with right to right and left to right bridging collaterals from circumflex RPL and LAD the PDA.  Marland Kitchen NM MYOVIEW LTD  October 2014    Low risk. Fixed basal inferior artifact normal EF. No ischemia --> (as compared to pre-PCI Myoview revealing inferolateral ischemia)  . rotator cuff Right   . TRANSTHORACIC ECHOCARDIOGRAM  11/2014   EF 65-70%, Mod LVH. Normal wall motion. Gr 1 DD. Normal valves.  Marland Kitchen VAGINAL HYSTERECTOMY       OB History   None      Home Medications    Prior to Admission medications   Medication Sig Start Date End Date Taking? Authorizing Provider  acetaminophen (TYLENOL) 650 MG CR tablet Take 650 mg by mouth every 6 (six) hours as needed for pain.    [provider]  atorvastatin (LIPITOR) 80 MG tablet Take 1 tablet (80 mg total) by mouth daily at 6 PM. 07/25/17   Gherghe, Vella Redhead, MD  azelastine (ASTELIN) 0.1 % nasal spray Place 2 sprays into both nostrils at bedtime. Use in each nostril as directed     [provider]  divalproex (DEPAKOTE SPRINKLE) 125 MG capsule Take 125 mg by mouth 2 (two) times daily. 10/30/17   [provider]  docusate sodium (STOOL SOFTENER) 100 MG capsule Take 100 mg by mouth at bedtime.     [provider]  fish oil-omega-3 fatty acids 1000 MG capsule Take 1 g by mouth daily.     [provider]  furosemide (LASIX) 20 MG tablet Take 20 mg by mouth daily.    [provider]  gabapentin (NEURONTIN) 300 MG capsule Take 300 mg by mouth 3 (three) times daily.     [provider]  glucagon (GLUCAGON EMERGENCY) 1 MG injection Inject 1 mg into the vein once as needed. Give 1 mg IM for Hypoglycemia (symptomatic) not responding to oral glucose, snack. May repeat x 1 after 15 minutes. USE ONLY IF PT IS OR IS BECOMING UNRESPONSIVE    [provider]  HUMALOG KWIKPEN 100 UNIT/ML KiwkPen Inject 22 Units into the skin 3 (three) times daily with meals. DM2. Hold for CBG < 200 05/28/17    [provider]  [facility] EX) Apply liberal amount topically to area of skin irritation prn. OK to leave at bedside.    [provider]  Insulin Glargine (TOUJEO MAX SOLOSTAR) 300 UNIT/ML SOPN Inject 75 Units into the skin at bedtime. Alternate abdominal injection sites.    [provider]  latanoprost (XALATAN) 0.005 % ophthalmic solution Place 1 drop into both eyes at bedtime.    [provider]  levocetirizine (XYZAL) 5 MG tablet Take 2.5 mg by mouth daily.     [provider]  Lidocaine-Menthol (ICY HOT LIDOCAINE PLUS MENTHOL) 4-1 % PTCH Apply 1 patch topically daily. apply to lower back in the a.m. for pain management    [provider]  Magnesium 500 MG CAPS Take 500 mg by mouth at bedtime.     [provider]  metFORMIN (GLUCOPHAGE-XR) 500 MG 24 hr tablet Take 500 mg by mouth daily with breakfast.     [provider]  metoprolol tartrate (LOPRESSOR) 25 MG tablet Take 1 tablet (25 mg total) by mouth 2 (two) times daily. 07/08/17   Epifanio Lesches, MD  nitroGLYCERIN (NITROSTAT) 0.4 MG SL tablet Place 0.4 mg under the tongue every 5 (five) minutes as needed for chest pain.    [provider]  oxybutynin (DITROPAN) 5 MG tablet Take 5 mg by mouth 2 (two) times daily.    [provider]  pantoprazole (PROTONIX) 40 MG tablet Take 1 tablet (40 mg total) by mouth daily. 04/05/17   Leone Haven, MD  PARoxetine (PAXIL) 20 MG tablet Take 1 tablet (20 mg total) by mouth daily. 07/25/17 07/25/18  Caren Griffins, MD  polyethylene glycol (MIRALAX / GLYCOLAX) packet Take 17 g by mouth daily as needed for moderate constipation. 04/25/17   Nicholes Mango, MD  Rivaroxaban (XARELTO) 15 MG TABS tablet Take 15 mg by mouth every evening.     [provider]  rOPINIRole (REQUIP) 0.5 MG tablet TAKE 1 TABLET BY MOUTH AT  BEDTIME 05/30/17   Leone Haven, MD  timolol (TIMOPTIC) 0.5 %  ophthalmic solution Place 1 drop into both eyes at bedtime.     [provider]  traMADol (ULTRAM) 50 MG tablet Take 1 tablet (50 mg total) by mouth 3 (three) times daily. At 6 am 12 pm and 10 pm 11/14/17   Gerlene Fee, NP  traMADol Veatrice Bourbon) 50  MG tablet Take 1 tablet (50 mg total) by mouth daily as needed. 11/14/17   Gerlene Fee, NP  trolamine salicylate (ASPERCREME) 10 % cream Apply 1 application topically 3 (three) times daily. Apply to right knee    [provider]  UNABLE TO FIND Diet order: upgrade patient to regular consistencies and thin liquids; add extra bowl of gravy.  Diet Type: NAS, NCS    [provider]    Family History Family History  Problem Relation Age of Onset  . COPD Mother   . Pulmonary fibrosis Mother   . Heart attack Father   . Heart disease Father   . Stroke Father   . Cancer Sister   . Cancer Sister   . Clotting disorder Sister   . Heart attack Sister   . Arthritis Unknown   . Breast cancer Sister   . Colon cancer Neg Hx   . Esophageal cancer Neg Hx   . Stomach cancer Neg Hx     Social History Social History   Tobacco Use  . Smoking status: Never Smoker  . Smokeless tobacco: Never Used  Substance Use Topics  . Alcohol use: No  . Drug use: No     Allergies   Amlodipine; Clopidogrel bisulfate; Kenalog [triamcinolone acetonide]; Lisinopril; Losartan; and Sulfa antibiotics   Review of Systems Review of Systems  Unable to perform ROS: Acuity of condition     Physical Exam Updated Vital Signs BP 128/62   Pulse 65   Temp 98.9 F (37.2 C) (Oral)   Resp 14   Ht 5\' 5"  (1.651 m)   Wt 78.7 kg   SpO2 97%   BMI 28.87 kg/m   Physical Exam  Constitutional:  Patient has a fixed gaze.  Not answering questions.  HENT:  Head: Normocephalic and atraumatic.  Eyes: Conjunctivae are normal.  Neck: Neck supple.  Cardiovascular: Normal rate and regular rhythm.  Pulmonary/Chest: Effort normal and breath sounds  normal.  Abdominal: Soft. Bowel sounds are normal.  Musculoskeletal: Normal range of motion.  Neurological:  Obtunded.  Does not respond to direct questions.  Skin: Skin is warm and dry.  Psychiatric:  Unable.  Nursing note and vitals reviewed.    ED Treatments / Results  Labs (all labs ordered are listed, but only abnormal results are displayed) Labs Reviewed  PROTIME-INR - Abnormal; Notable for the following components:      Result Value   Prothrombin Time 15.3 (*)    All other components within normal limits  CBC - Abnormal; Notable for the following components:   RBC 3.67 (*)    Hemoglobin 11.5 (*)    MCV 100.3 (*)    All other components within normal limits  DIFFERENTIAL - Abnormal; Notable for the following components:   Abs Immature Granulocytes 0.09 (*)    All other components within normal limits  COMPREHENSIVE METABOLIC PANEL - Abnormal; Notable for the following components:   Creatinine, Ser 1.22 (*)    Albumin 3.1 (*)    Total Bilirubin 0.2 (*)    GFR calc non Af Amer 39 (*)    GFR calc Af Amer 45 (*)    All other components within normal limits  I-STAT CHEM 8, ED - Abnormal; Notable for the following components:   Creatinine, Ser 1.30 (*)    Hemoglobin 11.9 (*)    HCT 35.0 (*)    All other components within normal limits  APTT  I-STAT TROPONIN, ED  CBG MONITORING, ED  EKG None  Date: 12/21/2017  Rate: 73  Rhythm: normal sinus rhythm  QRS Axis: normal  Intervals: normal  ST/T Wave abnormalities: normal  Conduction Disutrbances: none  Narrative Interpretation: unremarkable    Radiology Ct Head Code Stroke Wo Contrast  Result Date: 12/21/2017 CLINICAL DATA:  Code stroke. EXAM: CT HEAD WITHOUT CONTRAST TECHNIQUE: Contiguous axial images were obtained from the base of the skull through the vertex without intravenous contrast. COMPARISON:  07/22/2017 MRI head.  07/21/2017 CT head and CTA head. FINDINGS: Brain: No evidence of acute infarction,  hemorrhage, hydrocephalus, extra-axial collection or mass lesion/mass effect. Small chronic infarcts within bilateral thalami and the right caudate head are stable. Encephalomalacia involving the left lentiform nucleus, caudate body, and corona radiata corresponding to prior infarction. Stable chronic microvascular ischemic changes and volume loss of the brain. Vascular: Calcific atherosclerosis of carotid siphons. No hyperdense vessel identified. Skull: Normal. Negative for fracture or focal lesion. Sinuses/Orbits: Opacification of the sphenoid sinus with fluid level. Normal aeration of mastoid air cells. Bilateral intra-ocular lens replacement. Other: None. ASPECTS [facility] Stroke Program Early CT Score) - Ganglionic level infarction (caudate, lentiform nuclei, internal capsule, insula, M1-M3 cortex): 7 - Supraganglionic infarction (M4-M6 cortex): 3 Total score (0-10 with 10 being normal): 10 IMPRESSION: 1. No acute intracranial abnormality identified. 2. ASPECTS is 10. 3. Stable chronic microvascular ischemic changes, volume loss, and small chronic infarcts of the basal ganglia. 4. Chronic infarct of the left lentiform nucleus, caudate body, and corona radiata corresponding to the prior acute infarction on 07/22/2017 MRI of the brain. 5. Sphenoid sinus opacification with fluid level, progressed from prior study, compatible with acute sinusitis in the appropriate clinical setting. These results were communicated to Dr. Lorraine Lax at 6:04 pmon 10/25/2019by text page via the [facility] messaging system. Electronically Signed   By: Kristine Garbe M.D.   On: 12/21/2017 18:07    Procedures Procedures (including critical care time)  Medications Ordered in ED Medications  LORazepam (ATIVAN) 2 MG/ML injection (  Not Given 12/21/17 1815)  levETIRAcetam (KEPPRA) IVPB 500 mg/100 mL premix (has no administration in time range)  levETIRAcetam (KEPPRA) IVPB 1000 mg/100 mL premix (0 mg Intravenous Stopped 12/21/17  1837)     Initial Impression / Assessment and Plan / ED Course  I have reviewed the triage vital signs and the nursing notes.  Pertinent labs & imaging results that were available during my care of the patient were reviewed by me and considered in my medical decision making (see chart for details).     Code stroke initiated.  Neurology consult suggested a possible seizure.  Keppra ordered.  Discussed with hospitalist and family members.  MRI of brain ordered.   CRITICAL CARE Performed by: Nat Christen  ?  Total critical care time: 30 minutes  Critical care time was exclusive of separately billable procedures and treating other patients.  Critical care was necessary to treat or prevent imminent or life-threatening deterioration.  Critical care was time spent personally by me on the following activities: development of treatment plan with patient and/or surrogate as well as nursing, discussions with consultants, evaluation of patient's response to treatment, examination of patient, obtaining history from patient or surrogate, ordering and performing treatments and interventions, ordering and review of laboratory studies, ordering and review of radiographic studies, pulse oximetry and re-evaluation of patient's condition. Final Clinical Impressions(s) / ED Diagnoses   Final diagnoses:  Altered mental status, unspecified altered mental status type    ED Discharge Orders    None  Nat Christen, MD 12/21/17 2052    Nat Christen, MD 12/21/17 2118

## 2017-12-21 NOTE — H&P (Signed)
History and Physical   Abigail Boyd SHF:[id number] DOB: [birth year] DOA: 12/21/2017  Referring MD/NP/PA: Dr. Lacinda Axon PCP: Leone Haven, MD   Outpatient Specialists: None  Patient coming from: Skilled facility  Chief Complaint: Right facial droop and aphasia  HPI: Abigail Boyd is a 84 y.o. female with medical history significant of previous CVA in 2016, obstructive sleep apnea, paroxysmal atrial fibrillation on chronic anticoagulation, hypertension, diabetes, chronic kidney disease stage II and hyperlipidemia who lives at [facility] skilled nursing facility.  She was last seen normal around 1:30 in the afternoon.  Around 1615 she was noted to have some right facial droop as well as aphasia.  Patient also had forced gaze and was overall different from her baseline.  She was suspected to have had a stroke and 911 was called.  On her way to the ER she was noted to be having some mild jerky movements.  Partial seizures suspected.  A code stroke was called when she arrived the ER.  Patient has been evaluated by neurology and suspected to have partial seizures versus CVA.  She is being admitted for work-up.  She is a DNR.  Patient not able to get any history at the moment per family at bedside..  ED Course: Temperature is 98.9 blood pressure 152/68 pulse 65 respiratory rate of 15 oxygen sat 95% room air.  White count is 10.4 hemoglobin 11.9 platelets 317.  Sodium 140 potassium 3.9.  Creatinine 1.30.  INR 1.22.  Head CT without contrast is negative.  MRI of the brain pending.  Review of Systems: As per HPI otherwise 10 point review of systems negative.    Past Medical History:  Diagnosis Date  . Abnormal nuclear stress test 06/21/2011   Inferolateral reversible defect;--> cardiac cath & PCI of CxOM, occluded RCA with collaterals;; followup Myoview 11/2012: Low risk. Fixed basal inferior artifact normal EF. No ischemia  . Anxiety   . Asthma   . Bilateral cataracts    Status post stroke or  correction  . CAD S/P percutaneous coronary angioplasty 06/2011   s/P PCI to proximal OM1 w/ DES; occluded RCA with bridging and L-R collaterals (medical management)  . Chronic kidney disease (CKD), stage II (mild)    Related to current bladder infections (although diabetes cannot be excluded)  . Chronotropic incompetence with sinus node dysfunction [facility]) October 2013   On CPET test; beta blockers reduced  . Diabetes mellitus type 2, controlled ([place])    On oral medications  . Diverticulitis   . Essential hypertension    Allowing for permissive hypertension to avoid orthostatic hypotension  . GERD (gastroesophageal reflux disease)    On PPI  . Glaucoma   . History of syncope    Per EP - neurocardiogenic & not Bradycardia related (no PPM)  . History of unstable angina 06/13/2011   Jaw pain awakening from sleep -- Myoview --CATH --> PCI  . Hyperlipidemia with target LDL less than 70    HDL at goal, LDL not at goal, borderline triglycerides. On Crestor 20 mg  . Migraines   . OSA (obstructive sleep apnea)    hx bladder infections  . Osteoarthritis   . PAF (paroxysmal atrial fibrillation) ([place]) 10-11/ 2014   CardioNet Event Monitor: NSR & S Brady -- Rates 50-100; Total A. fib burden 35 hours and 27 minutes. 1296 episodes, longest was 1 hour 29 minutes. Rate ranged from 52-169 beats per minute.  . Seasonal allergies   . Shortness of breath on exertion October  2013   2-D echo: Normal EF>55%, Gr 1DD, mild aortic sclerosis; Evaluated with CPET - peak VO2 97%; Chronotropic Incompetence (submaximal effort)  . Spinal stenosis of lumbar region 11/2011  . Stroke ([place]) 06/2017  . Stroke ([place]) 11/2014  . Tachycardia-bradycardia syndrome ([place]) 11/2012  . Urge incontinence     Past Surgical History:  Procedure Laterality Date  . APPENDECTOMY    . BREAST BIOPSY     both breast  . cataracts     both eyes  . COLONOSCOPY    . CORONARY ANGIOPLASTY WITH STENT PLACEMENT  07/05/2011   Promus  Element DES 2.5 mm x 16 mm-post dilated to 2.65 mm CX-Proximal OM1  . Holter Monitor  04/2015   Sinus rhythm with rates 52-149 BPM. Isolated PACs with rare couplets and bigeminy. Multiple short runs of PAT/PSVT. Arrhythmia run 120 bpm for 204 beats. 14% of time was in A. fib/flutter. - Reviewed by Dr. Caryl Comes. Not thought to be significant enough for her syncope.  Marland Kitchen KNEE ARTHROSCOPY WITH MEDIAL MENISECTOMY Right 06/24/2014   Procedure: RIGHT KNEE ARTHROSCOPY WITH partial lateral MENISECTOMY, abrasion chondroplasty of medial femoral condyle and patella, microfracture technique;  Surgeon: Latanya Maudlin, MD;  Location: [facility];  Service: Orthopedics;  Laterality: Right;  . LEFT HEART CATHETERIZATION WITH CORONARY ANGIOGRAM N/A 07/06/2011   Procedure: LEFT HEART CATHETERIZATION WITH CORONARY ANGIOGRAM;  Surgeon: Leonie Man, MD;  Location: [facility] CATH LAB: Unstable Angina --> Myoview wiht Inf-Lat Ischemia.  Proximal OM1 lesion --> PCI; 100% mid RCA with right to right and left to right bridging collaterals from circumflex RPL and LAD the PDA.  Marland Kitchen NM MYOVIEW LTD  October 2014    Low risk. Fixed basal inferior artifact normal EF. No ischemia --> (as compared to pre-PCI Myoview revealing inferolateral ischemia)  . rotator cuff Right   . TRANSTHORACIC ECHOCARDIOGRAM  11/2014   EF 65-70%, Mod LVH. Normal wall motion. Gr 1 DD. Normal valves.  Marland Kitchen VAGINAL HYSTERECTOMY       reports that she has never smoked. She has never used smokeless tobacco. She reports that she does not drink alcohol or use drugs.  Allergies  Allergen Reactions  . Amlodipine Cough  . Clopidogrel Bisulfate Cough  . Kenalog [Triamcinolone Acetonide] Other (See Comments)    unknown  . Lisinopril Cough  . Losartan Cough  . Sulfa Antibiotics Hives and Rash    Family History  Problem Relation Age of Onset  . COPD Mother   . Pulmonary fibrosis Mother   . Heart attack Father   . Heart disease Father   . Stroke Father   . Cancer Sister     . Cancer Sister   . Clotting disorder Sister   . Heart attack Sister   . Arthritis Unknown   . Breast cancer Sister   . Colon cancer Neg Hx   . Esophageal cancer Neg Hx   . Stomach cancer Neg Hx      Prior to Admission medications   Medication Sig Start Date End Date Taking? Authorizing Provider  acetaminophen (TYLENOL) 650 MG CR tablet Take 650 mg by mouth every 6 (six) hours as needed for pain.   Yes [provider]  atorvastatin (LIPITOR) 80 MG tablet Take 1 tablet (80 mg total) by mouth daily at 6 PM. Patient taking differently: Take 80 mg by mouth daily.  07/25/17  Yes Gherghe, Vella Redhead, MD  azelastine (ASTELIN) 0.1 % nasal spray Place 2 sprays into both nostrils at bedtime. Use in  each nostril as directed    Yes [provider]  divalproex (DEPAKOTE) 125 MG DR tablet Take 250 mg by mouth at bedtime.   Yes [provider]  docusate sodium (STOOL SOFTENER) 100 MG capsule Take 100 mg by mouth daily.    Yes [provider]  fish oil-omega-3 fatty acids 1000 MG capsule Take 1 g by mouth daily.    Yes [provider]  furosemide (LASIX) 20 MG tablet Take 20 mg by mouth daily.   Yes [provider]  gabapentin (NEURONTIN) 300 MG capsule Take 300 mg by mouth 3 (three) times daily.    Yes [provider]  glucagon (GLUCAGON EMERGENCY) 1 MG injection Inject 1 mg into the vein once as needed. Give 1 mg IM for Hypoglycemia (symptomatic) not responding to oral glucose, snack. May repeat x 1 after 15 minutes. USE ONLY IF PT IS OR IS BECOMING UNRESPONSIVE   Yes [provider]  HUMALOG KWIKPEN 100 UNIT/ML KiwkPen Inject 22 Units into the skin 3 (three) times daily with meals. DM2. Hold for CBG < 200 05/28/17  Yes [provider]  [facility] EX) Apply liberal amount topically to area of skin irritation prn. OK to leave at bedside.   Yes [provider]  Insulin Glargine (TOUJEO MAX SOLOSTAR)  300 UNIT/ML SOPN Inject 75 Units into the skin at bedtime. Alternate abdominal injection sites.   Yes [provider]  latanoprost (XALATAN) 0.005 % ophthalmic solution Place 1 drop into both eyes at bedtime.   Yes [provider]  levocetirizine (XYZAL) 5 MG tablet Take 2.5 mg by mouth daily.    Yes [provider]  Lidocaine-Menthol (ICY HOT LIDOCAINE PLUS MENTHOL) 4-1 % PTCH Apply 1 patch topically daily. apply to lower back in the a.m. for pain management   Yes [provider]  Magnesium 500 MG CAPS Take 500 mg by mouth at bedtime.    Yes [provider]  metFORMIN (GLUCOPHAGE-XR) 500 MG 24 hr tablet Take 500 mg by mouth daily with breakfast.    Yes [provider]  metoprolol tartrate (LOPRESSOR) 25 MG tablet Take 1 tablet (25 mg total) by mouth 2 (two) times daily. 07/08/17  Yes Epifanio Lesches, MD  nitroGLYCERIN (NITROSTAT) 0.4 MG SL tablet Place 0.4 mg under the tongue every 5 (five) minutes as needed for chest pain.   Yes [provider]  oxybutynin (DITROPAN) 5 MG tablet Take 5 mg by mouth 2 (two) times daily.   Yes [provider]  pantoprazole (PROTONIX) 40 MG tablet Take 1 tablet (40 mg total) by mouth daily. 04/05/17  Yes Leone Haven, MD  PARoxetine (PAXIL) 20 MG tablet Take 1 tablet (20 mg total) by mouth daily. 07/25/17 07/25/18 Yes Gherghe, Vella Redhead, MD  polyethylene glycol (MIRALAX / GLYCOLAX) packet Take 17 g by mouth daily as needed for moderate constipation. 04/25/17  Yes Gouru, Illene Silver, MD  Rivaroxaban (XARELTO) 15 MG TABS tablet Take 15 mg by mouth every evening.    Yes [provider]  rOPINIRole (REQUIP) 0.5 MG tablet TAKE 1 TABLET BY MOUTH AT  BEDTIME 05/30/17  Yes Leone Haven, MD  timolol (TIMOPTIC) 0.5 % ophthalmic solution Place 1 drop into both eyes at bedtime.    Yes [provider]  traMADol (ULTRAM) 50 MG tablet Take 1 tablet (50 mg total) by mouth 3 (three) times daily.  At 6 am 12 pm and 10 pm 11/14/17  Yes Ok Edwards  S, NP  traMADol (ULTRAM) 50 MG tablet Take 1 tablet (50 mg total) by mouth daily as needed. Patient taking differently: Take 50 mg by mouth daily as needed for moderate pain.  11/14/17  Yes Gerlene Fee, NP  trolamine salicylate (ASPERCREME) 10 % cream Apply 1 application topically 3 (three) times daily. Apply to right knee   Yes [provider]    Physical Exam: Vitals:   22-Dec-2017 1815 12/22/17 1830 Dec 22, 2017 1845 12/22/17 1900  BP: (!) 143/64 (!) 152/68 131/61 128/62  Pulse: 68 67 67 65  Resp: 12 12 12 14   Temp:      TempSrc:      SpO2: 95% 96% 95% 97%  Weight:      Height:          Constitutional: NAD, calm, comfortable, Confused Vitals:   22-Dec-2017 1815 22-Dec-2017 1830 December 22, 2017 1845 12/22/2017 1900  BP: (!) 143/64 (!) 152/68 131/61 128/62  Pulse: 68 67 67 65  Resp: 12 12 12 14   Temp:      TempSrc:      SpO2: 95% 96% 95% 97%  Weight:      Height:       Eyes: PERRL, lids and conjunctivae normal ENMT: Mucous membranes are moist. Posterior pharynx clear of any exudate or lesions.Normal dentition.  Neck: normal, supple, no masses, no thyromegaly Respiratory: clear to auscultation bilaterally, no wheezing, no crackles. Normal respiratory effort. No accessory muscle use.  Cardiovascular: Regular rate and rhythm, no murmurs / rubs / gallops. No extremity edema. 2+ pedal pulses. No carotid bruits.  Abdomen: no tenderness, no masses palpated. No hepatosplenomegaly. Bowel sounds positive.  Musculoskeletal: no clubbing / cyanosis. No joint deformity upper and lower extremities. Good ROM, no contractures. Normal muscle tone.  Skin: no rashes, lesions, ulcers. No induration Neurologic: Right facial droop. CN 2-12 grossly intact. Sensation intact, DTR normal. Strength 5/5 in all 4.  Psychiatric: Confused, no agiatation   Labs on Admission: I have personally reviewed following labs and imaging studies  CBC: Recent Labs    Lab 22-Dec-2017 1746 12/22/2017 1747  WBC 10.4  --   NEUTROABS 6.9  --   HGB 11.5* 11.9*  HCT 36.8 35.0*  MCV 100.3*  --   PLT 317  --    Basic Metabolic Panel: Recent Labs  Lab 12/22/2017 1746 2017/12/22 1747  NA 140 140  K 3.9 3.9  CL 100 99  CO2 27  --   GLUCOSE 87 89  BUN 16 19  CREATININE 1.22* 1.30*  CALCIUM 9.7  --    GFR: Estimated Creatinine Clearance: 32.2 mL/min (A) (by C-G formula based on SCr of 1.3 mg/dL (H)). Liver Function Tests: Recent Labs  Lab 12/22/17 1746  AST 28  ALT 29  ALKPHOS 114  BILITOT 0.2*  PROT 6.6  ALBUMIN 3.1*   No results for input(s): LIPASE, AMYLASE in the last 168 hours. No results for input(s): AMMONIA in the last 168 hours. Coagulation Profile: Recent Labs  Lab 2017/12/22 1746  INR 1.22   Cardiac Enzymes: No results for input(s): CKTOTAL, CKMB, CKMBINDEX, TROPONINI in the last 168 hours. BNP (last 3 results) No results for input(s): PROBNP in the last 8760 hours. HbA1C: No results for input(s): HGBA1C in the last 72 hours. CBG: Recent Labs  Lab 22-Dec-2017 1743  GLUCAP 83   Lipid Profile: No results for input(s): CHOL, HDL, LDLCALC, TRIG, CHOLHDL, LDLDIRECT in the last 72 hours. Thyroid Function Tests: No results for input(s): TSH, T4TOTAL, FREET4,  T3FREE, THYROIDAB in the last 72 hours. Anemia Panel: No results for input(s): VITAMINB12, FOLATE, FERRITIN, TIBC, IRON, RETICCTPCT in the last 72 hours. Urine analysis:    Component Value Date/Time   COLORURINE YELLOW (A) 11/08/2017 1200   APPEARANCEUR HAZY (A) 11/08/2017 1200   APPEARANCEUR Cloudy (A) 06/04/2017 1507   LABSPEC 1.017 11/08/2017 1200   PHURINE 7.0 11/08/2017 1200   GLUCOSEU NEGATIVE 11/08/2017 1200   GLUCOSEU NEGATIVE 08/19/2015 1630   HGBUR NEGATIVE 11/08/2017 1200   BILIRUBINUR NEGATIVE 11/08/2017 1200   BILIRUBINUR Negative 06/04/2017 1507   KETONESUR NEGATIVE 11/08/2017 1200   PROTEINUR NEGATIVE 11/08/2017 1200   UROBILINOGEN 0.2 04/12/2017 1355    UROBILINOGEN 0.2 08/19/2015 1630   NITRITE NEGATIVE 11/08/2017 1200   LEUKOCYTESUR SMALL (A) 11/08/2017 1200   LEUKOCYTESUR 3+ (A) 06/04/2017 1507   Sepsis Labs: @LABRCNTIP (procalcitonin:4,lacticidven:4) )No results found for this or any previous visit (from the past 240 hour(s)).   Radiological Exams on Admission: Ct Head Code Stroke Wo Contrast  Result Date: 12/21/2017 CLINICAL DATA:  Code stroke. EXAM: CT HEAD WITHOUT CONTRAST TECHNIQUE: Contiguous axial images were obtained from the base of the skull through the vertex without intravenous contrast. COMPARISON:  07/22/2017 MRI head.  07/21/2017 CT head and CTA head. FINDINGS: Brain: No evidence of acute infarction, hemorrhage, hydrocephalus, extra-axial collection or mass lesion/mass effect. Small chronic infarcts within bilateral thalami and the right caudate head are stable. Encephalomalacia involving the left lentiform nucleus, caudate body, and corona radiata corresponding to prior infarction. Stable chronic microvascular ischemic changes and volume loss of the brain. Vascular: Calcific atherosclerosis of carotid siphons. No hyperdense vessel identified. Skull: Normal. Negative for fracture or focal lesion. Sinuses/Orbits: Opacification of the sphenoid sinus with fluid level. Normal aeration of mastoid air cells. Bilateral intra-ocular lens replacement. Other: None. ASPECTS [facility] Stroke Program Early CT Score) - Ganglionic level infarction (caudate, lentiform nuclei, internal capsule, insula, M1-M3 cortex): 7 - Supraganglionic infarction (M4-M6 cortex): 3 Total score (0-10 with 10 being normal): 10 IMPRESSION: 1. No acute intracranial abnormality identified. 2. ASPECTS is 10. 3. Stable chronic microvascular ischemic changes, volume loss, and small chronic infarcts of the basal ganglia. 4. Chronic infarct of the left lentiform nucleus, caudate body, and corona radiata corresponding to the prior acute infarction on 07/22/2017 MRI of the brain.  5. Sphenoid sinus opacification with fluid level, progressed from prior study, compatible with acute sinusitis in the appropriate clinical setting. These results were communicated to Dr. Lorraine Lax at 6:04 pmon 10/25/2019by text page via the [facility] messaging system. Electronically Signed   By: Kristine Garbe M.D.   On: 12/21/2017 18:07    EKG: Independently reviewed.  EKG showed normal sinus rhythm with a rate of 81.  Nonspecific ST changes.  Assessment/Plan Principal Problem:   Seizure disorder (HCC) Active Problems:   Essential hypertension   Long term current use of anticoagulant therapy   Obstructive sleep apnea   CKD stage 2 due to type 2 diabetes mellitus ([place])   GERD (gastroesophageal reflux disease)   Diabetes mellitus type 2 with neurological manifestations ([place])     #1 partial seizure: Suspected partial seizure based on neurology evaluation.  At this point we will continue with Keppra.  Patient was already on Depakote and will continue with that.  Await MRI results to rule out CVA.  She had a previous stroke so she could have another stroke or back with several focus for his seizure.  PT OT as well as further work-up will be done per neurology.  #  2 hypertension: Blood pressure is  controlled.  If she truly has a stroke we may do permissive hypertension.  #3 diabetes: Sliding scale insulin will be ordered.  Hold any oral hypoglycemics.  #4 chronic kidney disease stage II: Appears to be at baseline.  Continue monitoring.  #5 paroxysmal atrial fibrillation: Patient currently on Xarelto.  We will resume Xarelto if MRI is negative.  #6 GERD: Continue with PPIs.     DVT prophylaxis: Xarelto Code Status: DNR Family Communication: Daughter is in the room Disposition Plan: Back to skilled facility Consults called: Dr. Malen Gauze neurology Admission status: Inpatient  Severity of Illness: The appropriate patient status for this patient is INPATIENT. Inpatient status is judged  to be reasonable and necessary in order to provide the required intensity of service to ensure the patient's safety. The patient's presenting symptoms, physical exam findings, and initial radiographic and laboratory data in the context of their chronic comorbidities is felt to place them at high risk for further clinical deterioration. Furthermore, it is not anticipated that the patient will be medically stable for discharge from the hospital within 2 midnights of admission. The following factors support the patient status of inpatient.   " The patient's presenting symptoms include confusion and facial droop. " The worrisome physical exam findings include patient confused. " The initial radiographic and laboratory data are worrisome because of negative head CT. " The chronic co-morbidities include paroxysmal atrial fibrillation with previous stroke.   * I certify that at the point of admission it is my clinical judgment that the patient will require inpatient hospital care spanning beyond 2 midnights from the point of admission due to high intensity of service, high risk for further deterioration and high frequency of surveillance required.Barbette Merino MD [name] Pager [phone]  If 7PM-7AM, please contact night-coverage [email or web address] Password [email or web address]  12/21/2017, 9:59 PM

## 2017-12-21 NOTE — Progress Notes (Signed)
Mrs. Abigail Boyd is a 84 year old female presenting to ED via EMS with right side facial droop and aphasia. NIHSS 19, CBG 89, LKW 1615. Starting pt on Keppra gtt

## 2017-12-21 NOTE — ED Triage Notes (Signed)
Pt arrives via EMS from [place] as Code Stroke with reports of LSN at 15:30 today. Staff noticed pt at 16:15 with R facial droop, R arm drift, aphasic and with forced gaze. Pt has hx of R side deficits from prior CVA. CBG 83

## 2017-12-21 NOTE — Consult Note (Addendum)
NEURO HOSPITALIST  CONSULT   Requesting Physician: Dr. Lacinda Axon    Chief Complaint: right facial droop, r arm drift, aphasic  History obtained from:  Chart  HPI:                                                                                                                                         Abigail Boyd is an 84 y.o. female  With [place] stroke ( 2016), PAF ( on xarelto), OSA, HLD, HTN, DM2, CKD 2 who presented to [facility] for right facial droop and aphasia. Code stroke was called in ED.   Per EMS patient can normally talk. She lives at [facility]. Staff noticed about 1615 that the patient had some right facial droop, was aphasic and some forced gaze. Patient is on xarelto.  During assessment in the emergency department, patient would have waxing and waning of mental status and intermittently have gaze preference towards the right side.  No jerking movements noted in the emergency room, however per EMS she had some mild jerking..  She also complained of pain in her right leg.   ED course:  Ct head: no hemmorrhage BP: 143/64 BG: 89  Chart review of past stroke:  12/26/2014 left frontal cortical and subcortical infarct 07/23/17: large  left thalamic infarct  Date last known well: Date: 12/21/2017 Time last known well: Time: 15:30 tPA Given: No: on xarelto Modified Rankin: Rankin Score=4 NIHSS:19     Past Medical History:  Diagnosis Date  . Abnormal nuclear stress test 06/21/2011   Inferolateral reversible defect;--> cardiac cath & PCI of CxOM, occluded RCA with collaterals;; followup Myoview 11/2012: Low risk. Fixed basal inferior artifact normal EF. No ischemia  . Anxiety   . Asthma   . Bilateral cataracts    Status post stroke or correction  . CAD S/P percutaneous coronary angioplasty 06/2011   s/P PCI to proximal OM1 w/ DES; occluded RCA with bridging and L-R collaterals (medical management)  . Chronic kidney disease (CKD), stage II  (mild)    Related to current bladder infections (although diabetes cannot be excluded)  . Chronotropic incompetence with sinus node dysfunction [facility]) October 2013   On CPET test; beta blockers reduced  . Diabetes mellitus type 2, controlled ([place])    On oral medications  . Diverticulitis   . Essential hypertension    Allowing for permissive hypertension to avoid orthostatic hypotension  . GERD (gastroesophageal reflux disease)    On PPI  . Glaucoma   . History of syncope    Per EP - neurocardiogenic & not Bradycardia related (no PPM)  . History of unstable angina 06/13/2011  Jaw pain awakening from sleep -- Myoview --CATH --> PCI  . Hyperlipidemia with target LDL less than 70    HDL at goal, LDL not at goal, borderline triglycerides. On Crestor 20 mg  . Migraines   . OSA (obstructive sleep apnea)    hx bladder infections  . Osteoarthritis   . PAF (paroxysmal atrial fibrillation) ([place]) 10-11/ 2014   CardioNet Event Monitor: NSR & S Brady -- Rates 50-100; Total A. fib burden 35 hours and 27 minutes. 1296 episodes, longest was 1 hour 29 minutes. Rate ranged from 52-169 beats per minute.  . Seasonal allergies   . Shortness of breath on exertion October 2013   2-D echo: Normal EF>55%, Gr 1DD, mild aortic sclerosis; Evaluated with CPET - peak VO2 97%; Chronotropic Incompetence (submaximal effort)  . Spinal stenosis of lumbar region 11/2011  . Stroke ([place])   . Tachycardia-bradycardia syndrome ([place]) 11/2012  . Urge incontinence     Past Surgical History:  Procedure Laterality Date  . APPENDECTOMY    . BREAST BIOPSY     both breast  . cataracts     both eyes  . COLONOSCOPY    . CORONARY ANGIOPLASTY WITH STENT PLACEMENT  07/05/2011   Promus Element DES 2.5 mm x 16 mm-post dilated to 2.65 mm CX-Proximal OM1  . Holter Monitor  04/2015   Sinus rhythm with rates 52-149 BPM. Isolated PACs with rare couplets and bigeminy. Multiple short runs of PAT/PSVT. Arrhythmia run 120 bpm for 204  beats. 14% of time was in A. fib/flutter. - Reviewed by Dr. Caryl Comes. Not thought to be significant enough for her syncope.  Marland Kitchen KNEE ARTHROSCOPY WITH MEDIAL MENISECTOMY Right 06/24/2014   Procedure: RIGHT KNEE ARTHROSCOPY WITH partial lateral MENISECTOMY, abrasion chondroplasty of medial femoral condyle and patella, microfracture technique;  Surgeon: Latanya Maudlin, MD;  Location: [facility];  Service: Orthopedics;  Laterality: Right;  . LEFT HEART CATHETERIZATION WITH CORONARY ANGIOGRAM N/A 07/06/2011   Procedure: LEFT HEART CATHETERIZATION WITH CORONARY ANGIOGRAM;  Surgeon: Leonie Man, MD;  Location: [facility] CATH LAB: Unstable Angina --> Myoview wiht Inf-Lat Ischemia.  Proximal OM1 lesion --> PCI; 100% mid RCA with right to right and left to right bridging collaterals from circumflex RPL and LAD the PDA.  Marland Kitchen NM MYOVIEW LTD  October 2014    Low risk. Fixed basal inferior artifact normal EF. No ischemia --> (as compared to pre-PCI Myoview revealing inferolateral ischemia)  . rotator cuff Right   . TRANSTHORACIC ECHOCARDIOGRAM  11/2014   EF 65-70%, Mod LVH. Normal wall motion. Gr 1 DD. Normal valves.  Marland Kitchen VAGINAL HYSTERECTOMY      Family History  Problem Relation Age of Onset  . COPD Mother   . Pulmonary fibrosis Mother   . Heart attack Father   . Heart disease Father   . Stroke Father   . Cancer Sister   . Cancer Sister   . Clotting disorder Sister   . Heart attack Sister   . Arthritis Unknown   . Breast cancer Sister   . Colon cancer Neg Hx   . Esophageal cancer Neg Hx   . Stomach cancer Neg Hx          Social History:  reports that she has never smoked. She has never used smokeless tobacco. She reports that she does not drink alcohol or use drugs.  Allergies:  Allergies  Allergen Reactions  . Amlodipine Cough  . Clopidogrel Bisulfate Cough  . Kenalog [Triamcinolone Acetonide] Other (See Comments)  unknown  . Lisinopril Cough  . Losartan Cough  . Sulfa Antibiotics Hives and Rash     Medications:                                                                                                                           Current Facility-Administered Medications  Medication Dose Route Frequency Provider Last Rate Last Dose  . levETIRAcetam (KEPPRA) IVPB 1000 mg/100 mL premix  1,000 mg Intravenous STAT Vonzella Nipple, NP      . LORazepam (ATIVAN) 2 MG/ML injection            Current Outpatient Medications  Medication Sig Dispense Refill  . acetaminophen (TYLENOL) 650 MG CR tablet Take 650 mg by mouth every 6 (six) hours as needed for pain.    Marland Kitchen atorvastatin (LIPITOR) 80 MG tablet Take 1 tablet (80 mg total) by mouth daily at 6 PM.    . azelastine (ASTELIN) 0.1 % nasal spray Place 2 sprays into both nostrils at bedtime. Use in each nostril as directed     . divalproex (DEPAKOTE SPRINKLE) 125 MG capsule Take 125 mg by mouth 2 (two) times daily.    Marland Kitchen docusate sodium (STOOL SOFTENER) 100 MG capsule Take 100 mg by mouth at bedtime.     . fish oil-omega-3 fatty acids 1000 MG capsule Take 1 g by mouth daily.     . furosemide (LASIX) 20 MG tablet Take 20 mg by mouth daily.    Marland Kitchen gabapentin (NEURONTIN) 300 MG capsule Take 300 mg by mouth 3 (three) times daily.     Marland Kitchen glucagon (GLUCAGON EMERGENCY) 1 MG injection Inject 1 mg into the vein once as needed. Give 1 mg IM for Hypoglycemia (symptomatic) not responding to oral glucose, snack. May repeat x 1 after 15 minutes. USE ONLY IF PT IS OR IS BECOMING UNRESPONSIVE    . HUMALOG KWIKPEN 100 UNIT/ML KiwkPen Inject 22 Units into the skin 3 (three) times daily with meals. DM2. Hold for CBG < 200  3  . Infant Care Products [facility] EX) Apply liberal amount topically to area of skin irritation prn. OK to leave at bedside.    . Insulin Glargine (TOUJEO MAX SOLOSTAR) 300 UNIT/ML SOPN Inject 75 Units into the skin at bedtime. Alternate abdominal injection sites.    . latanoprost (XALATAN) 0.005 % ophthalmic solution Place 1 drop into both  eyes at bedtime.    Marland Kitchen levocetirizine (XYZAL) 5 MG tablet Take 2.5 mg by mouth daily.     . Lidocaine-Menthol (ICY HOT LIDOCAINE PLUS MENTHOL) 4-1 % PTCH Apply 1 patch topically daily. apply to lower back in the a.m. for pain management    . Magnesium 500 MG CAPS Take 500 mg by mouth at bedtime.     . metFORMIN (GLUCOPHAGE-XR) 500 MG 24 hr tablet Take 500 mg by mouth daily with breakfast.     . metoprolol tartrate (LOPRESSOR) 25 MG tablet Take 1 tablet (25 mg total) by  mouth 2 (two) times daily. 30 tablet 0  . nitroGLYCERIN (NITROSTAT) 0.4 MG SL tablet Place 0.4 mg under the tongue every 5 (five) minutes as needed for chest pain.    Marland Kitchen oxybutynin (DITROPAN) 5 MG tablet Take 5 mg by mouth 2 (two) times daily.    . pantoprazole (PROTONIX) 40 MG tablet Take 1 tablet (40 mg total) by mouth daily. 90 tablet 1  . PARoxetine (PAXIL) 20 MG tablet Take 1 tablet (20 mg total) by mouth daily. 30 tablet 2  . polyethylene glycol (MIRALAX / GLYCOLAX) packet Take 17 g by mouth daily as needed for moderate constipation. 14 each 0  . Rivaroxaban (XARELTO) 15 MG TABS tablet Take 15 mg by mouth every evening.     Marland Kitchen rOPINIRole (REQUIP) 0.5 MG tablet TAKE 1 TABLET BY MOUTH AT  BEDTIME 90 tablet 1  . timolol (TIMOPTIC) 0.5 % ophthalmic solution Place 1 drop into both eyes at bedtime.     . traMADol (ULTRAM) 50 MG tablet Take 1 tablet (50 mg total) by mouth 3 (three) times daily. At 6 am 12 pm and 10 pm 75 tablet 0  . traMADol (ULTRAM) 50 MG tablet Take 1 tablet (50 mg total) by mouth daily as needed. 75 tablet 0  . trolamine salicylate (ASPERCREME) 10 % cream Apply 1 application topically 3 (three) times daily. Apply to right knee    . UNABLE TO FIND Diet order: upgrade patient to regular consistencies and thin liquids; add extra bowl of gravy.  Diet Type: NAS, NCS       ROS:                                                                                                                                         unobtainable from patient due to mental status    General Examination:                                                                                                      Blood pressure (!) 145/82, pulse 75, temperature 98.9 F (37.2 C), temperature source Oral, resp. rate 15, height 5\' 5"  (1.651 m), weight 78.7 kg, SpO2 97 %.  HEENT-  Normocephalic, no lesions, without obvious abnormality.  Normal external eye and conjunctiva.  Cardiovascular- S1-S2 audible, pulses palpable throughout   Lungs-no rhonchi or wheezing noted, no excessive working breathing.  Saturations within normal limits on RA Abdomen- abdomen round and non-tender,  Extremities- Warm, dry and intact Musculoskeletal-right  leg appears contracted; very stiff Skin-warm and dry, no hyperpigmentation, vitiligo, or suspicious lesions  Neurological Examination Mental Status: Patient awake, oriented to name only. Able to follow some commands. Will grip hands and raise arms. Attempts to raise legs. Will not open and close eyes. Cranial Nerves: Does not blink to threat.  At times patients gaze appears forced to the right. Eyes will cross midline eventually. EOEMI. PERRL  Motor: Bilateral hand grips are strong. BUE: able to lift anti- gravity. RLE: appears contracted, patient grimaces to movement of that extremity. LLE: some movement noted. Did not break gravity. Tone and bulk:normal tone throughout; no atrophy noted Sensory:  light touch intact throughout, bilaterally Deep Tendon Reflexes: 2+ and symmetric biceps and patella Plantars: Right: downgoing   Left: downgoing Cerebellar: UTA Gait: deferred   Lab Results: Basic Metabolic Panel: Recent Labs  Lab 12/21/17 1747  NA 140  K 3.9  CL 99  GLUCOSE 89  BUN 19  CREATININE 1.30*    CBC: Recent Labs  Lab 12/21/17 1746 12/21/17 1747  WBC 10.4  --   NEUTROABS 6.9  --   HGB 11.5* 11.9*  HCT 36.8 35.0*  MCV 100.3*  --   PLT 317  --     Lipid Panel: No  results for input(s): CHOL, TRIG, HDL, CHOLHDL, VLDL, LDLCALC in the last 168 hours.  CBG: No results for input(s): GLUCAP in the last 168 hours.  Imaging: Ct Head Code Stroke Wo Contrast  Result Date: 12/21/2017 CLINICAL DATA:  Code stroke. EXAM: CT HEAD WITHOUT CONTRAST TECHNIQUE: Contiguous axial images were obtained from the base of the skull through the vertex without intravenous contrast. COMPARISON:  07/22/2017 MRI head.  07/21/2017 CT head and CTA head. FINDINGS: Brain: No evidence of acute infarction, hemorrhage, hydrocephalus, extra-axial collection or mass lesion/mass effect. Small chronic infarcts within bilateral thalami and the right caudate head are stable. Encephalomalacia involving the left lentiform nucleus, caudate body, and corona radiata corresponding to prior infarction. Stable chronic microvascular ischemic changes and volume loss of the brain. Vascular: Calcific atherosclerosis of carotid siphons. No hyperdense vessel identified. Skull: Normal. Negative for fracture or focal lesion. Sinuses/Orbits: Opacification of the sphenoid sinus with fluid level. Normal aeration of mastoid air cells. Bilateral intra-ocular lens replacement. Other: None. ASPECTS [facility] Stroke Program Early CT Score) - Ganglionic level infarction (caudate, lentiform nuclei, internal capsule, insula, M1-M3 cortex): 7 - Supraganglionic infarction (M4-M6 cortex): 3 Total score (0-10 with 10 being normal): 10 IMPRESSION: 1. No acute intracranial abnormality identified. 2. ASPECTS is 10. 3. Stable chronic microvascular ischemic changes, volume loss, and small chronic infarcts of the basal ganglia. 4. Chronic infarct of the left lentiform nucleus, caudate body, and corona radiata corresponding to the prior acute infarction on 07/22/2017 MRI of the brain. 5. Sphenoid sinus opacification with fluid level, progressed from prior study, compatible with acute sinusitis in the appropriate clinical setting. These results were  communicated to Dr. Lorraine Lax at 6:04 pmon 10/25/2019by text page via the [facility] messaging system. Electronically Signed   By: Kristine Garbe M.D.   On: 12/21/2017 18:07       Laurey Morale, MSN, NP-C [facility] [phone]   12/21/2017, 6:16 PM   Attending physician note to follow with Assessment and plan .   Assessment: 84 y.o. female With [place] stroke ( 2016), PAF ( on xarelto), OSA, HLD, HTN, DM2, CKD 2 who presented to [facility] for right facial droop and aphasia. Code stroke was called in ED.  EMS noticed mild  jerking movements upon arrival to [facility] [place]. CT head: no hemorrhage. Given presentation of waxing and waning forced gaze deviation concerned for partial seizure. 1g Keppra given. routine EEG ordered. MRI ordered to r/o stroke.    Impression: Partial seizure Stroke Risk Factors - atrial fibrillation, hyperlipidemia and hypertension    Recommendations: --MRI Brain  -Hold Xarelto until MRI brain - routine EEG -neuro checks -tele monitoring -Keppra 1g x 1 now, 500mg  BID     --please page stroke NP  Or  PA  Or MD from 8am -4 pm  as this patient from this time will be  followed by the stroke.   You can look them up on [email or web address]  Password [email or web address]    NEUROHOSPITALIST ADDENDUM Performed a face to face diagnostic evaluation.   I have reviewed the contents of history and physical exam as documented by PA/ARNP/Resident and agree with above documentation.  I have discussed and formulated the above plan as documented. Edits to the note have been made as needed.  84 year old female with left thalamic stroke and residual right hemiparesis presents with aphasia, worsening right-sided weakness noted in the nursing home.  Per EMS her symptoms resolved in route however she worsened again on arrival.  Code stroke was called.  Head CT was negative for bleed.  She was not a candidate for TPA as she is on anticoagulation. Not a candidate for emergent thrombectomy in case she  had large vessel occlusion due to poor baseline MRS.  Also there was\remaining of her exam.  At times she would follow commands and other times she would not.  Intermittent gaze preference towards the right side, which is her weaker side which raises the concern for partial seizures.  Loaded patient with 1 g of Keppra.  Patient will need a MRI brain to rule out stroke.  She will also need EEG. Notify MD patient has significant change in mental status or seizure.     Karena Addison Bern Fare MD [facility] [phone]   If 7pm to 7am, please call on call as listed on AMION.

## 2017-12-22 ENCOUNTER — Inpatient Hospital Stay (HOSPITAL_COMMUNITY): Payer: Medicare Other

## 2017-12-22 DIAGNOSIS — G40909 Epilepsy, unspecified, not intractable, without status epilepticus: Principal | ICD-10-CM

## 2017-12-22 LAB — GLUCOSE, CAPILLARY
GLUCOSE-CAPILLARY: 52 mg/dL — AB (ref 70–99)
GLUCOSE-CAPILLARY: 139 mg/dL — AB (ref 70–99)
Glucose-Capillary: 80 mg/dL (ref 70–99)
Glucose-Capillary: 58 mg/dL — ABNORMAL LOW (ref 70–99)
Glucose-Capillary: 127 mg/dL — ABNORMAL HIGH (ref 70–99)

## 2017-12-22 LAB — URINALYSIS, ROUTINE W REFLEX MICROSCOPIC
Bilirubin Urine: NEGATIVE
GLUCOSE, UA: NEGATIVE mg/dL
Hgb urine dipstick: NEGATIVE
Ketones, ur: NEGATIVE mg/dL
LEUKOCYTES UA: NEGATIVE
Nitrite: NEGATIVE
Protein, ur: NEGATIVE mg/dL
SPECIFIC GRAVITY, URINE: 1.011 (ref 1.005–1.030)
pH: 9 — ABNORMAL HIGH (ref 5.0–8.0)

## 2017-12-22 LAB — CBC
HCT: 36.2 % (ref 36.0–46.0)
HEMOGLOBIN: 11.4 g/dL — AB (ref 12.0–15.0)
MCH: 31.1 pg (ref 26.0–34.0)
MCHC: 31.5 g/dL (ref 30.0–36.0)
MCV: 98.9 fL (ref 80.0–100.0)
Platelets: 309 10*3/uL (ref 150–400)
RBC: 3.66 MIL/uL — AB (ref 3.87–5.11)
RDW: 12.8 % (ref 11.5–15.5)
WBC: 10.1 10*3/uL (ref 4.0–10.5)
nRBC: 0 % (ref 0.0–0.2)

## 2017-12-22 LAB — COMPREHENSIVE METABOLIC PANEL
ALT: 28 U/L (ref 0–44)
ANION GAP: 9 (ref 5–15)
AST: 27 U/L (ref 15–41)
Albumin: 3.2 g/dL — ABNORMAL LOW (ref 3.5–5.0)
Alkaline Phosphatase: 96 U/L (ref 38–126)
BILIRUBIN TOTAL: 0.5 mg/dL (ref 0.3–1.2)
BUN: 16 mg/dL (ref 8–23)
CO2: 26 mmol/L (ref 22–32)
Calcium: 9.4 mg/dL (ref 8.9–10.3)
Chloride: 105 mmol/L (ref 98–111)
Creatinine, Ser: 1.12 mg/dL — ABNORMAL HIGH (ref 0.44–1.00)
GFR, EST AFRICAN AMERICAN: 50 mL/min — AB (ref >60)
GFR, EST NON AFRICAN AMERICAN: 43 mL/min — AB (ref >60)
Glucose, Bld: 65 mg/dL — ABNORMAL LOW (ref 70–99)
POTASSIUM: 4.1 mmol/L (ref 3.5–5.1)
Sodium: 140 mmol/L (ref 135–145)
TOTAL PROTEIN: 6.4 g/dL — AB (ref 6.5–8.1)

## 2017-12-22 LAB — TSH: TSH: 3.223 u[IU]/mL (ref 0.350–4.500)

## 2017-12-22 MED ORDER — OMEGA-3-ACID ETHYL ESTERS 1 G PO CAPS
1.0000 g | ORAL_CAPSULE | Freq: Every day | ORAL | Status: DC
  Administered 2017-12-27: 1 g via ORAL
  Filled 2017-12-22 (×5): qty 1

## 2017-12-22 MED ORDER — PANTOPRAZOLE SODIUM 40 MG PO TBEC
40.0000 mg | DELAYED_RELEASE_TABLET | Freq: Every day | ORAL | Status: DC
  Administered 2017-12-26 – 2017-12-27 (×2): 40 mg via ORAL
  Filled 2017-12-22 (×3): qty 1

## 2017-12-22 MED ORDER — ONDANSETRON HCL 4 MG/2ML IJ SOLN: 4.0000 mg | Freq: Four times a day (QID) | INTRAMUSCULAR | Status: DC | PRN

## 2017-12-22 MED ORDER — METFORMIN HCL ER 500 MG PO TB24: 500.0000 mg | ORAL_TABLET | Freq: Every day | ORAL | Status: DC

## 2017-12-22 MED ORDER — DEXTROSE-NACL 5-0.9 % IV SOLN
INTRAVENOUS | Status: DC
  Administered 2017-12-22 – 2017-12-27 (×5): via INTRAVENOUS

## 2017-12-22 MED ORDER — LORAZEPAM 2 MG/ML IJ SOLN
0.5000 mg | Freq: Once | INTRAMUSCULAR | Status: AC
  Administered 2017-12-22: 0.5 mg via INTRAVENOUS
  Filled 2017-12-22: qty 1

## 2017-12-22 MED ORDER — TIMOLOL MALEATE 0.5 % OP SOLN
1.0000 [drp] | Freq: Every day | OPHTHALMIC | Status: DC
  Administered 2017-12-22 – 2017-12-26 (×6): 1 [drp] via OPHTHALMIC
  Filled 2017-12-22: qty 5

## 2017-12-22 MED ORDER — POLYETHYLENE GLYCOL 3350 17 G PO PACK: 17.0000 g | PACK | Freq: Every day | ORAL | Status: DC | PRN

## 2017-12-22 MED ORDER — FUROSEMIDE 20 MG PO TABS
20.0000 mg | ORAL_TABLET | Freq: Every day | ORAL | Status: DC
  Administered 2017-12-26 – 2017-12-27 (×2): 20 mg via ORAL
  Filled 2017-12-22 (×3): qty 1

## 2017-12-22 MED ORDER — AZELASTINE HCL 0.1 % NA SOLN
2.0000 | Freq: Every day | NASAL | Status: DC
  Administered 2017-12-23 – 2017-12-26 (×4): 2 via NASAL
  Filled 2017-12-22: qty 30

## 2017-12-22 MED ORDER — OXYBUTYNIN CHLORIDE 5 MG PO TABS
5.0000 mg | ORAL_TABLET | Freq: Two times a day (BID) | ORAL | Status: DC
  Administered 2017-12-25 – 2017-12-27 (×4): 5 mg via ORAL
  Filled 2017-12-22 (×6): qty 1

## 2017-12-22 MED ORDER — SODIUM CHLORIDE 0.9 % IV SOLN
INTRAVENOUS | Status: DC
  Administered 2017-12-22: 16:00:00 via INTRAVENOUS

## 2017-12-22 MED ORDER — ROPINIROLE HCL 1 MG PO TABS
0.5000 mg | ORAL_TABLET | Freq: Every day | ORAL | Status: DC
  Administered 2017-12-25 – 2017-12-26 (×2): 0.5 mg via ORAL
  Filled 2017-12-22 (×3): qty 1

## 2017-12-22 MED ORDER — NITROGLYCERIN 0.4 MG SL SUBL: 0.4000 mg | SUBLINGUAL_TABLET | SUBLINGUAL | Status: DC | PRN

## 2017-12-22 MED ORDER — DOCUSATE SODIUM 100 MG PO CAPS
100.0000 mg | ORAL_CAPSULE | Freq: Every day | ORAL | Status: DC
  Filled 2017-12-22 (×2): qty 1

## 2017-12-22 MED ORDER — TRAMADOL HCL 50 MG PO TABS
50.0000 mg | ORAL_TABLET | Freq: Three times a day (TID) | ORAL | Status: DC
  Administered 2017-12-26 – 2017-12-27 (×5): 50 mg via ORAL
  Filled 2017-12-22 (×9): qty 1

## 2017-12-22 MED ORDER — LATANOPROST 0.005 % OP SOLN
1.0000 [drp] | Freq: Every day | OPHTHALMIC | Status: DC
  Administered 2017-12-22 – 2017-12-26 (×6): 1 [drp] via OPHTHALMIC
  Filled 2017-12-22: qty 2.5

## 2017-12-22 MED ORDER — ATORVASTATIN CALCIUM 80 MG PO TABS
80.0000 mg | ORAL_TABLET | Freq: Every day | ORAL | Status: DC
  Administered 2017-12-26 – 2017-12-27 (×2): 80 mg via ORAL
  Filled 2017-12-22 (×5): qty 1

## 2017-12-22 MED ORDER — PAROXETINE HCL 20 MG PO TABS
20.0000 mg | ORAL_TABLET | Freq: Every day | ORAL | Status: DC
  Administered 2017-12-26 – 2017-12-27 (×2): 20 mg via ORAL
  Filled 2017-12-22 (×3): qty 1

## 2017-12-22 MED ORDER — INSULIN ASPART 100 UNIT/ML ~~LOC~~ SOLN
0.0000 [IU] | Freq: Every day | SUBCUTANEOUS | Status: DC
  Administered 2017-12-25: 4 [IU] via SUBCUTANEOUS
  Administered 2017-12-26: 2 [IU] via SUBCUTANEOUS

## 2017-12-22 MED ORDER — MUSCLE RUB 10-15 % EX CREA
1.0000 "application " | TOPICAL_CREAM | Freq: Three times a day (TID) | CUTANEOUS | Status: DC
  Administered 2017-12-22 – 2017-12-27 (×14): 1 via TOPICAL
  Filled 2017-12-22: qty 85

## 2017-12-22 MED ORDER — MAGNESIUM OXIDE 400 (241.3 MG) MG PO TABS
400.0000 mg | ORAL_TABLET | Freq: Every day | ORAL | Status: DC
  Administered 2017-12-25 – 2017-12-26 (×2): 400 mg via ORAL
  Filled 2017-12-22 (×3): qty 1

## 2017-12-22 MED ORDER — DIVALPROEX SODIUM 250 MG PO DR TAB
250.0000 mg | DELAYED_RELEASE_TABLET | Freq: Every day | ORAL | Status: DC
  Filled 2017-12-22: qty 1

## 2017-12-22 MED ORDER — METOPROLOL TARTRATE 25 MG PO TABS
25.0000 mg | ORAL_TABLET | Freq: Two times a day (BID) | ORAL | Status: DC
  Filled 2017-12-22 (×2): qty 1

## 2017-12-22 MED ORDER — GABAPENTIN 300 MG PO CAPS
300.0000 mg | ORAL_CAPSULE | Freq: Three times a day (TID) | ORAL | Status: DC
  Administered 2017-12-24 – 2017-12-27 (×6): 300 mg via ORAL
  Filled 2017-12-22 (×8): qty 1

## 2017-12-22 MED ORDER — TRAMADOL HCL 50 MG PO TABS
50.0000 mg | ORAL_TABLET | Freq: Every day | ORAL | Status: DC | PRN
  Administered 2017-12-25: 50 mg via ORAL

## 2017-12-22 MED ORDER — LIDOCAINE 5 % EX PTCH
1.0000 | MEDICATED_PATCH | CUTANEOUS | Status: DC
  Administered 2017-12-22 – 2017-12-27 (×6): 1 via TRANSDERMAL
  Filled 2017-12-22 (×6): qty 1

## 2017-12-22 MED ORDER — INSULIN ASPART 100 UNIT/ML ~~LOC~~ SOLN
22.0000 [IU] | Freq: Three times a day (TID) | SUBCUTANEOUS | Status: DC
  Administered 2017-12-24: 22 [IU] via SUBCUTANEOUS

## 2017-12-22 MED ORDER — RIVAROXABAN 15 MG PO TABS
15.0000 mg | ORAL_TABLET | Freq: Every evening | ORAL | Status: DC
  Administered 2017-12-24 – 2017-12-26 (×3): 15 mg via ORAL
  Filled 2017-12-22 (×4): qty 1

## 2017-12-22 MED ORDER — ACETAMINOPHEN 325 MG PO TABS: 650.0000 mg | ORAL_TABLET | Freq: Four times a day (QID) | ORAL | Status: DC | PRN

## 2017-12-22 MED ORDER — LORATADINE 10 MG PO TABS
10.0000 mg | ORAL_TABLET | Freq: Every day | ORAL | Status: DC
  Administered 2017-12-26 – 2017-12-27 (×2): 10 mg via ORAL
  Filled 2017-12-22 (×3): qty 1

## 2017-12-22 MED ORDER — INSULIN GLARGINE 100 UNIT/ML ~~LOC~~ SOLN
75.0000 [IU] | Freq: Every day | SUBCUTANEOUS | Status: DC
  Administered 2017-12-23 – 2017-12-26 (×4): 75 [IU] via SUBCUTANEOUS
  Filled 2017-12-22 (×6): qty 0.75

## 2017-12-22 MED ORDER — INSULIN ASPART 100 UNIT/ML ~~LOC~~ SOLN
0.0000 [IU] | Freq: Three times a day (TID) | SUBCUTANEOUS | Status: DC
  Administered 2017-12-23 (×2): 3 [IU] via SUBCUTANEOUS
  Administered 2017-12-24: 5 [IU] via SUBCUTANEOUS
  Administered 2017-12-24: 2 [IU] via SUBCUTANEOUS

## 2017-12-22 MED ORDER — ONDANSETRON HCL 4 MG PO TABS: 4.0000 mg | ORAL_TABLET | Freq: Four times a day (QID) | ORAL | Status: DC | PRN

## 2017-12-22 MED ORDER — DEXTROSE 50 % IV SOLN
INTRAVENOUS | Status: AC
  Administered 2017-12-22: 50 mL
  Filled 2017-12-22: qty 50

## 2017-12-22 NOTE — Procedures (Signed)
Date of recording10/26/2019  Referring physician Gala Romney  Reason for the study Seizure  Technical Digital EEG recording using 10-20 international electrode system  Description of the recording When awake posterior dominant rhythm is8-9Hz  symmetrical reactive Mild generalized delta during drowsiness Non REM stage II sleepwas not obtained Epileptiform features were not seen during this recording  Impression The EEG isnormal in awake and drowsy state only.

## 2017-12-22 NOTE — Progress Notes (Signed)
PROGRESS NOTE    Abigail Boyd  GBT:[id number] DOB: 28-Feb-1930 DOA: 12/21/2017 PCP: Leone Haven, MD  Outpatient Specialists:     Brief Narrative:   84 year old lady with medical history significant for but not limited to previous CVA in 2016 with respiratory fibrillation on Xarelto, pulse sleep apnea hypertension presenting with right facial droop, aphasia associated with mild jerking movements and  waxing and waning gaze deviation,  Intermittently following commands and not following commands. MRI brain negative for acute infarct or intracranial process.    Neurology consulted noted better for possible process seizures    Assessment & Plan:   Principal Problem:   Seizure disorder [facility]) Active Problems:   Essential hypertension   Long term current use of anticoagulant therapy   Obstructive sleep apnea   CKD stage 2 due to type 2 diabetes mellitus (HCC)   GERD (gastroesophageal reflux disease)   Diabetes mellitus type 2 with neurological manifestations ([place])   Possible pulse as seizures  continue Keppra  seizure precautions  NPO -  Swallowing eval per speech   rule out underlying infection - urinalysis negative, chest x-ray pending  EEG pending    Paroxysmal AFib Resume xarelto with negative MRI    Diabetes Mellitus  Sliding scale insulin  Oral hypoglycemics on hold for now   DVT prophylaxis:  Xarelto Code Status:  DNR Family Communication:  Disposition Plan:  To be determined   Consultants:    neurology   Procedures:    Antimicrobials:    Subjective:   Objective: Vitals:   12/21/17 2045 12/22/17 0030 12/22/17 0111 12/22/17 0900  BP: (!) 150/66 (!) 141/61 (!) 148/64 133/73  Pulse: 70 66 66 60  Resp: 14 17 18 18   Temp:   97.7 F (36.5 C) 97.7 F (36.5 C)  TempSrc:   Axillary Axillary  SpO2: 96% 92% 97% 100%  Weight:   75.2 kg   Height:   5\' 5"  (7.106 m)     Intake/Output Summary (Last 24 hours) at 12/22/2017 1127 Last data filed at  12/22/2017 0045 Gross per 24 hour  Intake 200 ml  Output -  Net 200 ml   Filed Weights   12/21/17 1814 12/22/17 0111  Weight: 78.7 kg 75.2 kg    Examination:  General exam: Appears calm and comfortable  Respiratory system: Clear to auscultation. Respiratory effort normal. Cardiovascular system: S1 & S2 heard, RRR. No JVD, murmurs, rubs, gallops or clicks. No pedal edema. Gastrointestinal system: Abdomen is nondistended, soft and nontender. No organomegaly or masses felt. Normal bowel sounds heard. Central nervous system:  Intermittently lethargic but arousable, oriented x1, otherwise no focal neurological deficits. Extremities: Symmetric 5 x 5 power. Skin: No rashes, lesions or ulcers     Data Reviewed: I have personally reviewed following labs and imaging studies  CBC: Recent Labs  Lab 12/21/17 1746 12/21/17 1747 12/22/17 0344  WBC 10.4  --  10.1  NEUTROABS 6.9  --   --   HGB 11.5* 11.9* 11.4*  HCT 36.8 35.0* 36.2  MCV 100.3*  --  98.9  PLT 317  --  269   Basic Metabolic Panel: Recent Labs  Lab 12/21/17 1746 12/21/17 1747 12/22/17 0344  NA 140 140 140  K 3.9 3.9 4.1  CL 100 99 105  CO2 27  --  26  GLUCOSE 87 89 65*  BUN 16 19 16   CREATININE 1.22* 1.30* 1.12*  CALCIUM 9.7  --  9.4   GFR: Estimated Creatinine Clearance: 36.6 mL/min (  A) (by C-G formula based on SCr of 1.12 mg/dL (H)). Liver Function Tests: Recent Labs  Lab 12/21/17 1746 12/22/17 0344  AST 28 27  ALT 29 28  ALKPHOS 114 96  BILITOT 0.2* 0.5  PROT 6.6 6.4*  ALBUMIN 3.1* 3.2*   No results for input(s): LIPASE, AMYLASE in the last 168 hours. No results for input(s): AMMONIA in the last 168 hours. Coagulation Profile: Recent Labs  Lab 12/21/17 1746  INR 1.22   Cardiac Enzymes: No results for input(s): CKTOTAL, CKMB, CKMBINDEX, TROPONINI in the last 168 hours. BNP (last 3 results) No results for input(s): PROBNP in the last 8760 hours. HbA1C: No results for input(s): HGBA1C in  the last 72 hours. CBG: Recent Labs  Lab 12/21/17 1743 12/22/17 0616  GLUCAP 83 52*   Lipid Profile: No results for input(s): CHOL, HDL, LDLCALC, TRIG, CHOLHDL, LDLDIRECT in the last 72 hours. Thyroid Function Tests: Recent Labs    12/22/17 0344  TSH 3.223   Anemia Panel: No results for input(s): VITAMINB12, FOLATE, FERRITIN, TIBC, IRON, RETICCTPCT in the last 72 hours. Urine analysis:    Component Value Date/Time   COLORURINE YELLOW (A) 11/08/2017 1200   APPEARANCEUR HAZY (A) 11/08/2017 1200   APPEARANCEUR Cloudy (A) 06/04/2017 1507   LABSPEC 1.017 11/08/2017 1200   PHURINE 7.0 11/08/2017 1200   GLUCOSEU NEGATIVE 11/08/2017 1200   GLUCOSEU NEGATIVE 08/19/2015 1630   HGBUR NEGATIVE 11/08/2017 1200   BILIRUBINUR NEGATIVE 11/08/2017 1200   BILIRUBINUR Negative 06/04/2017 1507   KETONESUR NEGATIVE 11/08/2017 1200   PROTEINUR NEGATIVE 11/08/2017 1200   UROBILINOGEN 0.2 04/12/2017 1355   UROBILINOGEN 0.2 08/19/2015 1630   NITRITE NEGATIVE 11/08/2017 1200   LEUKOCYTESUR SMALL (A) 11/08/2017 1200   LEUKOCYTESUR 3+ (A) 06/04/2017 1507   Sepsis Labs: @LABRCNTIP (procalcitonin:4,lacticidven:4)  )No results found for this or any previous visit (from the past 240 hour(s)).       Radiology Studies: Mr Brain 40 Contrast  Result Date: 12/21/2017 CLINICAL DATA:  RIGHT facial droop, RIGHT arm drift and aphasia, last seen normal at 15 30 hours today. History of stroke, hypertension, hyperlipidemia and atrial fibrillation. EXAM: MRI HEAD WITHOUT CONTRAST TECHNIQUE: Multiplanar, multiecho pulse sequences of the brain and surrounding structures were obtained without intravenous contrast. COMPARISON:  CT HEAD December 21, 2017 and MRI head Jul 22, 2017 FINDINGS: Motion degraded examination, some sequences are at least moderately motion degraded. INTRACRANIAL CONTENTS: No reduced diffusion to suggest acute ischemia. No susceptibility artifact to suggest hemorrhage. Moderate to severe  parenchymal brain volume loss. Ex vacuo dilatation lateral ventricles due to multiple basal ganglia and thalami infarcts. Prominent basal ganglia perivascular spaces associated with chronic small vessel ischemic changes. Patchy to confluent supratentorial white matter T2 hyperintensities. Old small LEFT cerebellar infarct. No hydrocephalus. Mild RIGHT cerebral peduncle wallerian degeneration. No suspicious parenchymal signal, masses, mass effect. No abnormal extra-axial fluid collections. No extra-axial masses. VASCULAR: Normal major intracranial vascular flow voids present at skull base. SKULL AND UPPER CERVICAL SPINE: No abnormal sellar expansion. No suspicious calvarial bone marrow signal. Craniocervical junction maintained. SINUSES/ORBITS: Expansile mucocele LEFT sphenoid with obstructive RIGHT sinusitis.The included ocular globes and orbital contents are non-suspicious. Status post bilateral ocular lens implants. OTHER: None. IMPRESSION: 1. Motion degraded examination.  No acute intracranial process. 2. Multiple old small supra- and infratentorial infarcts. 3. Moderate to severe parenchymal brain volume loss. Electronically Signed   By: Elon Alas M.D.   On: 12/21/2017 22:03   Ct Head Code Stroke Wo Contrast  Result Date:  12/21/2017 CLINICAL DATA:  Code stroke. EXAM: CT HEAD WITHOUT CONTRAST TECHNIQUE: Contiguous axial images were obtained from the base of the skull through the vertex without intravenous contrast. COMPARISON:  07/22/2017 MRI head.  07/21/2017 CT head and CTA head. FINDINGS: Brain: No evidence of acute infarction, hemorrhage, hydrocephalus, extra-axial collection or mass lesion/mass effect. Small chronic infarcts within bilateral thalami and the right caudate head are stable. Encephalomalacia involving the left lentiform nucleus, caudate body, and corona radiata corresponding to prior infarction. Stable chronic microvascular ischemic changes and volume loss of the brain. Vascular:  Calcific atherosclerosis of carotid siphons. No hyperdense vessel identified. Skull: Normal. Negative for fracture or focal lesion. Sinuses/Orbits: Opacification of the sphenoid sinus with fluid level. Normal aeration of mastoid air cells. Bilateral intra-ocular lens replacement. Other: None. ASPECTS [facility] Stroke Program Early CT Score) - Ganglionic level infarction (caudate, lentiform nuclei, internal capsule, insula, M1-M3 cortex): 7 - Supraganglionic infarction (M4-M6 cortex): 3 Total score (0-10 with 10 being normal): 10 IMPRESSION: 1. No acute intracranial abnormality identified. 2. ASPECTS is 10. 3. Stable chronic microvascular ischemic changes, volume loss, and small chronic infarcts of the basal ganglia. 4. Chronic infarct of the left lentiform nucleus, caudate body, and corona radiata corresponding to the prior acute infarction on 07/22/2017 MRI of the brain. 5. Sphenoid sinus opacification with fluid level, progressed from prior study, compatible with acute sinusitis in the appropriate clinical setting. These results were communicated to Dr. Lorraine Lax at 6:04 pmon 10/25/2019by text page via the [facility] messaging system. Electronically Signed   By: Kristine Garbe M.D.   On: 12/21/2017 18:07        Scheduled Meds: . atorvastatin  80 mg Oral Daily  . azelastine  2 spray Each Nare QHS  . divalproex  250 mg Oral QHS  . docusate sodium  100 mg Oral Daily  . furosemide  20 mg Oral Daily  . gabapentin  300 mg Oral TID  . insulin aspart  0-5 Units Subcutaneous QHS  . insulin aspart  0-9 Units Subcutaneous TID WC  . insulin aspart  22 Units Subcutaneous TID WC  . insulin glargine  75 Units Subcutaneous QHS  . latanoprost  1 drop Both Eyes QHS  . lidocaine  1 patch Transdermal Q24H  . loratadine  10 mg Oral Daily  . magnesium oxide  400 mg Oral QHS  . metFORMIN  500 mg Oral Q breakfast  . metoprolol tartrate  25 mg Oral BID  . MUSCLE RUB  1 application Topical TID  . omega-3 acid ethyl  esters  1 g Oral Daily  . oxybutynin  5 mg Oral BID  . pantoprazole  40 mg Oral Daily  . PARoxetine  20 mg Oral Daily  . Rivaroxaban  15 mg Oral QPM  . rOPINIRole  0.5 mg Oral QHS  . timolol  1 drop Both Eyes QHS  . traMADol  50 mg Oral TID   Continuous Infusions: . levETIRAcetam Stopped (12/22/17 0045)     LOS: 1 day    Time spent: [place], MD [name] Pager [phone]  If 7PM-7AM, please contact night-coverage [email or web address] Password [facility] 12/22/2017, 11:27 AM

## 2017-12-22 NOTE — Progress Notes (Addendum)
NEURO HOSPITALIST PROGRESS NOTE   Subjective: Patient in bed eyes closed, NAD. Patient responded better today than yesterday. She followed a few more commands. Easily aroused, but would keep falling asleep during exam. Still with some of the same  gaze to the right that kept waxing and waning yesterday. Per RN last night some nystagmus was noted, and patient would only state her name, much like initial exam yesterday. Toward the end of the exam patient became more alert and was able to say " uh huh" and "uh uh" in response to some simple questions.  Exam: Vitals:   12/22/17 0030 12/22/17 0111  BP: (!) 141/61 (!) 148/64  Pulse: 66 66  Resp: 17 18  Temp:  97.7 F (36.5 C)  SpO2: 92% 97%    Physical Exam   HEENT-  Normocephalic, no lesions, without obvious abnormality.  Normal external eye and conjunctiva.  Bilateral cataract surgery. Cardiovascular- S1-S2 audible, pulses palpable throughout   Lungs-no rhonchi or wheezing noted, no excessive working breathing.  Saturations within normal limits on RA Abdomen- All 4 quadrants palpated and nontender Extremities- Warm, dry and intact Musculoskeletal- right leg somewhat contracted.  Skin-warm and dry, no hyperpigmentation, vitiligo, or suspicious lesions   Neuro:  Mental Status: Alert, oriented, to name. Unable to state age/ month/ year. Would not open and close eyes  Or stick out tongue on commands. Was able to life BUE on command and squeeze hands. Facial light touch sensation intact. Hearing intact.   Cranial Nerves: Did not blink to threat. [email or web address]. Pursed her lips when asked to smile.  Motor: BUE: able to life anti-gravity. Purposeful movements noted. LLE: was able to break gravity for a few seconds if leg was lifted In the air. RLE: contracted and patient grimaces in pain if you attempt to lift the leg anti- gravity. Increased tone in the right leg which is also stiff and slightly contracted. Had a little  less bend than yesterday.  Sensory:light touch intact throughout, bilaterally Deep Tendon Reflexes: 2+ and symmetric biceps, patella Plantars: Right: downgoing   Left: downgoing Cerebellar: UTA Gait: deferred    Medications:  Scheduled: . atorvastatin  80 mg Oral Daily  . azelastine  2 spray Each Nare QHS  . divalproex  250 mg Oral QHS  . docusate sodium  100 mg Oral Daily  . furosemide  20 mg Oral Daily  . gabapentin  300 mg Oral TID  . insulin aspart  0-5 Units Subcutaneous QHS  . insulin aspart  0-9 Units Subcutaneous TID WC  . insulin aspart  22 Units Subcutaneous TID WC  . insulin glargine  75 Units Subcutaneous QHS  . latanoprost  1 drop Both Eyes QHS  . lidocaine  1 patch Transdermal Q24H  . loratadine  10 mg Oral Daily  . magnesium oxide  400 mg Oral QHS  . metFORMIN  500 mg Oral Q breakfast  . metoprolol tartrate  25 mg Oral BID  . MUSCLE RUB  1 application Topical TID  . omega-3 acid ethyl esters  1 g Oral Daily  . oxybutynin  5 mg Oral BID  . pantoprazole  40 mg Oral Daily  . PARoxetine  20 mg Oral Daily  . Rivaroxaban  15 mg Oral QPM  . rOPINIRole  0.5 mg Oral QHS  . timolol  1 drop Both Eyes QHS  . traMADol  50 mg  Oral TID   Continuous: . levETIRAcetam Stopped (12/22/17 0045)   ATF:[id number], nitroGLYCERIN, ondansetron **OR** ondansetron (ZOFRAN) IV, polyethylene glycol, traMADol  Pertinent Labs/Diagnostics:   Mr Brain Wo Contrast  Result Date: 12/21/2017 CLINICAL DATA:  RIGHT facial droop, RIGHT arm drift and aphasia, last seen normal at 15 30 hours today. History of stroke, hypertension, hyperlipidemia and atrial fibrillation. EXAM: MRI HEAD WITHOUT CONTRAST TECHNIQUE: Multiplanar, multiecho pulse sequences of the brain and surrounding structures were obtained without intravenous contrast. COMPARISON:  CT HEAD December 21, 2017 and MRI head Jul 22, 2017 FINDINGS: Motion degraded examination, some sequences are at least moderately motion degraded.  INTRACRANIAL CONTENTS: No reduced diffusion to suggest acute ischemia. No susceptibility artifact to suggest hemorrhage. Moderate to severe parenchymal brain volume loss. Ex vacuo dilatation lateral ventricles due to multiple basal ganglia and thalami infarcts. Prominent basal ganglia perivascular spaces associated with chronic small vessel ischemic changes. Patchy to confluent supratentorial white matter T2 hyperintensities. Old small LEFT cerebellar infarct. No hydrocephalus. Mild RIGHT cerebral peduncle wallerian degeneration. No suspicious parenchymal signal, masses, mass effect. No abnormal extra-axial fluid collections. No extra-axial masses. VASCULAR: Normal major intracranial vascular flow voids present at skull base. SKULL AND UPPER CERVICAL SPINE: No abnormal sellar expansion. No suspicious calvarial bone marrow signal. Craniocervical junction maintained. SINUSES/ORBITS: Expansile mucocele LEFT sphenoid with obstructive RIGHT sinusitis.The included ocular globes and orbital contents are non-suspicious. Status post bilateral ocular lens implants. OTHER: None. IMPRESSION: 1. Motion degraded examination.  No acute intracranial process. 2. Multiple old small supra- and infratentorial infarcts. 3. Moderate to severe parenchymal brain volume loss. Electronically Signed   By: Elon Alas M.D.   On: 12/21/2017 22:03   Ct Head Code Stroke Wo Contrast  Result Date: 12/21/2017 CLINICAL DATA:  Code stroke. EXAM: CT HEAD WITHOUT CONTRAST TECHNIQUE: Contiguous axial images were obtained from the base of the skull through the vertex without intravenous contrast. COMPARISON:  07/22/2017 MRI head.  07/21/2017 CT head and CTA head. FINDINGS: Brain: No evidence of acute infarction, hemorrhage, hydrocephalus, extra-axial collection or mass lesion/mass effect. Small chronic infarcts within bilateral thalami and the right caudate head are stable. Encephalomalacia involving the left lentiform nucleus, caudate body,  and corona radiata corresponding to prior infarction. Stable chronic microvascular ischemic changes and volume loss of the brain. Vascular: Calcific atherosclerosis of carotid siphons. No hyperdense vessel identified. Skull: Normal. Negative for fracture or focal lesion. Sinuses/Orbits: Opacification of the sphenoid sinus with fluid level. Normal aeration of mastoid air cells. Bilateral intra-ocular lens replacement. Other: None. ASPECTS [facility] Stroke Program Early CT Score) - Ganglionic level infarction (caudate, lentiform nuclei, internal capsule, insula, M1-M3 cortex): 7 - Supraganglionic infarction (M4-M6 cortex): 3 Total score (0-10 with 10 being normal): 10 IMPRESSION: 1. No acute intracranial abnormality identified. 2. ASPECTS is 10. 3. Stable chronic microvascular ischemic changes, volume loss, and small chronic infarcts of the basal ganglia. 4. Chronic infarct of the left lentiform nucleus, caudate body, and corona radiata corresponding to the prior acute infarction on 07/22/2017 MRI of the brain. 5. Sphenoid sinus opacification with fluid level, progressed from prior study, compatible with acute sinusitis in the appropriate clinical setting. These results were communicated to Dr. Lorraine Lax at 6:04 pmon 10/25/2019by text page via the [facility] messaging system. Electronically Signed   By: Kristine Garbe M.D.   On: 12/21/2017 18:07   Assessment:  84 y.o. female With PMH stroke ( 2016), PAF ( on xarelto), OSA, HLD, HTN, DM2, CKD 2 who presented to [facility] for right facial droop  and aphasia. Code stroke was called in ED.  EMS noticed mild jerking movements upon arrival to [facility]. CT head: no hemorrhage. Given presentation of waxing and waning forced gaze deviation concerned for partial seizure. At time she would follow commands and at times she would stop following commands which is also concerning for partial seizures. 1g Keppra given then 500mg  BID. routine EEG pending.  MRI Brain : motion degraded, however  no acute infarct or intracranial process.    Impression: Possible Partial seizures  Recommendations:  - continue keppra 500 mg BID - seizure precuations - Check for underlying infection   Laurey Morale, MSN, NP-C [facility] [phone]  Attending neurologist's note to follow  12/22/2017, 8:30 AM   NEUROHOSPITALIST ADDENDUM Performed a face to face diagnostic evaluation.   I have reviewed the contents of history and physical exam as documented by PA/ARNP/Resident and agree with above documentation.  I have discussed and formulated the above plan as documented. Edits to the note have been made as needed.  Patient slightly more alert today on my examination.  During examination by my nurse practitioner, more lethargic and would have gaze preference to the right side.MRI is negative for acute infarct.  Suspect partial seizures, sedated likely to postictal state. EEG pending to rule out subclinical seizures. Look for underlying infection.  I have ordered urine analysis chest x-ray.     Karena Addison Harlo Jaso MD [facility] [phone]   If 7pm to 7am, please call on call as listed on AMION.

## 2017-12-22 NOTE — Evaluation (Signed)
Clinical/Bedside Swallow Evaluation Patient Details  Name: Abigail Boyd MRN: [id number] Date of Birth: 03/18/1929  Today's Date: 12/22/2017 Time: SLP Start Time (ACUTE ONLY): 79 SLP Stop Time (ACUTE ONLY): 1643 SLP Time Calculation (min) (ACUTE ONLY): 18 min  Past Medical History:  Past Medical History:  Diagnosis Date  . Abnormal nuclear stress test 06/21/2011   Inferolateral reversible defect;--> cardiac cath & PCI of CxOM, occluded RCA with collaterals;; followup Myoview 11/2012: Low risk. Fixed basal inferior artifact normal EF. No ischemia  . Anxiety   . Asthma   . Bilateral cataracts    Status post stroke or correction  . CAD S/P percutaneous coronary angioplasty 06/2011   s/P PCI to proximal OM1 w/ DES; occluded RCA with bridging and L-R collaterals (medical management)  . Chronic kidney disease (CKD), stage II (mild)    Related to current bladder infections (although diabetes cannot be excluded)  . Chronotropic incompetence with sinus node dysfunction [facility]) October 2013   On CPET test; beta blockers reduced  . Diabetes mellitus type 2, controlled ([place])    On oral medications  . Diverticulitis   . Essential hypertension    Allowing for permissive hypertension to avoid orthostatic hypotension  . GERD (gastroesophageal reflux disease)    On PPI  . Glaucoma   . History of syncope    Per EP - neurocardiogenic & not Bradycardia related (no PPM)  . History of unstable angina 06/13/2011   Jaw pain awakening from sleep -- Myoview --CATH --> PCI  . Hyperlipidemia with target LDL less than 70    HDL at goal, LDL not at goal, borderline triglycerides. On Crestor 20 mg  . Migraines   . OSA (obstructive sleep apnea)    hx bladder infections  . Osteoarthritis   . PAF (paroxysmal atrial fibrillation) ([place]) 10-11/ 2014   CardioNet Event Monitor: NSR & S Brady -- Rates 50-100; Total A. fib burden 35 hours and 27 minutes. 1296 episodes, longest was 1 hour 29 minutes. Rate ranged from  52-169 beats per minute.  . Seasonal allergies   . Shortness of breath on exertion October 2013   2-D echo: Normal EF>55%, Gr 1DD, mild aortic sclerosis; Evaluated with CPET - peak VO2 97%; Chronotropic Incompetence (submaximal effort)  . Spinal stenosis of lumbar region 11/2011  . Stroke ([place]) 06/2017  . Stroke ([place]) 11/2014  . Tachycardia-bradycardia syndrome ([place]) 11/2012  . Urge incontinence    Past Surgical History:  Past Surgical History:  Procedure Laterality Date  . APPENDECTOMY    . BREAST BIOPSY     both breast  . cataracts     both eyes  . COLONOSCOPY    . CORONARY ANGIOPLASTY WITH STENT PLACEMENT  07/05/2011   Promus Element DES 2.5 mm x 16 mm-post dilated to 2.65 mm CX-Proximal OM1  . Holter Monitor  04/2015   Sinus rhythm with rates 52-149 BPM. Isolated PACs with rare couplets and bigeminy. Multiple short runs of PAT/PSVT. Arrhythmia run 120 bpm for 204 beats. 14% of time was in A. fib/flutter. - Reviewed by Dr. Caryl Comes. Not thought to be significant enough for her syncope.  Marland Kitchen KNEE ARTHROSCOPY WITH MEDIAL MENISECTOMY Right 06/24/2014   Procedure: RIGHT KNEE ARTHROSCOPY WITH partial lateral MENISECTOMY, abrasion chondroplasty of medial femoral condyle and patella, microfracture technique;  Surgeon: Latanya Maudlin, MD;  Location: [facility];  Service: Orthopedics;  Laterality: Right;  . LEFT HEART CATHETERIZATION WITH CORONARY ANGIOGRAM N/A 07/06/2011   Procedure: LEFT HEART CATHETERIZATION WITH CORONARY ANGIOGRAM;  Surgeon: Leonie Man, MD;  Location: [facility] CATH LAB: Unstable Angina --> Myoview wiht Inf-Lat Ischemia.  Proximal OM1 lesion --> PCI; 100% mid RCA with right to right and left to right bridging collaterals from circumflex RPL and LAD the PDA.  Marland Kitchen NM MYOVIEW LTD  October 2014    Low risk. Fixed basal inferior artifact normal EF. No ischemia --> (as compared to pre-PCI Myoview revealing inferolateral ischemia)  . rotator cuff Right   . TRANSTHORACIC ECHOCARDIOGRAM  11/2014    EF 65-70%, Mod LVH. Normal wall motion. Gr 1 DD. Normal valves.  Marland Kitchen VAGINAL HYSTERECTOMY     HPI:  84 y.o.femaleWith PMH stroke (2016, May 2019), PAF, OSA, HLD, HTN, DM2, CKD 2 who presented to [facility] for right facial droop and aphasia. CT/MRI negative for acute process; concern for partial seizure. EEG results pending. Pt does have a history of dysphagia, had MBS 10/01/17 showing mild oropharyngeal dysphagia characterized by mild disorganized oral management and delayed pharyngeal swallow initiation; advancement to regular diet with thin liquids was recommended.    Assessment / Plan / Recommendation Clinical Impression   Patient is lethargic and following commands only intermittently. She does not follow commands for oral motor examination, but she does respond to ~25% of questions (primarily by shaking her head yes/no), and make some appropriate comments. Affect is flat and face is expressionless. Pt did not have a response or make any attempts to retrieve a small amount of water from spoon when presented. RN has been holding trays. Pt at increased risk for aspiration given history of dysphagia, but primarily due to current mentation. Recommend NPO pending improved alertness and ability to follow commands. Will follow up next date for reassessment.    SLP Visit Diagnosis: Dysphagia, oropharyngeal phase (R13.12)    Aspiration Risk  Moderate aspiration risk    Diet Recommendation NPO        Other  Recommendations Oral Care Recommendations: Oral care QID   Follow up Recommendations Other (comment)(tbd)      Frequency and Duration min 2x/week  2 weeks       Prognosis Prognosis for Safe Diet Advancement: Good Barriers to Reach Goals: Cognitive deficits      Swallow Study   General Date of Onset: 12/21/17 HPI: 84 y.o.femaleWith PMH stroke (2016, May 2019), PAF, OSA, HLD, HTN, DM2, CKD 2 who presented to [facility] for right facial droop and aphasia. CT/MRI negative for acute process;  concern for partial seizure. EEG results pending. Pt does have a history of dysphagia, had MBS 10/01/17 showing mild oropharyngeal dysphagia characterized by mild disorganized oral management and delayed pharyngeal swallow initiation; advancement to regular diet with thin liquids was recommended.  Type of Study: Bedside Swallow Evaluation Previous Swallow Assessment: see HPI Diet Prior to this Study: Regular;Thin liquids(RN holding trays) Temperature Spikes Noted: No Respiratory Status: Room air History of Recent Intubation: No Behavior/Cognition: Alert;Doesn't follow directions Oral Cavity Assessment: Other (comment)(unable to assess) Oral Care Completed by SLP: Other (Comment)(attempted) Oral Cavity - Dentition: Other (Comment)(UTA) Patient Positioning: Upright in bed Baseline Vocal Quality: Low vocal intensity Volitional Cough: Cognitively unable to elicit Volitional Swallow: Unable to elicit    Oral/Motor/Sensory Function Overall Oral Motor/Sensory Function: Other (comment)(UTA)   Ice Chips Ice chips: Not tested   Thin Liquid Thin Liquid: Impaired Presentation: Spoon Oral Phase Impairments: Reduced lingual movement/coordination;Poor awareness of bolus Oral Phase Functional Implications: Right anterior spillage    Nectar Thick Nectar Thick Liquid: Not tested   Honey Thick Honey Thick  Liquid: Not tested   Puree Puree: Not tested   Solid     Solid: Not tested     Deneise Lever, [place], CCC-SLP Speech-Language Pathologist Acute Rehabilitation Services Pager: [phone] Office: [phone]  Aliene Altes 12/22/2017,4:57 PM

## 2017-12-22 NOTE — Progress Notes (Signed)
Bedside EEG completed, results pending. 

## 2017-12-23 LAB — GLUCOSE, CAPILLARY
GLUCOSE-CAPILLARY: 163 mg/dL — AB (ref 70–99)
GLUCOSE-CAPILLARY: 236 mg/dL — AB (ref 70–99)
Glucose-Capillary: 219 mg/dL — ABNORMAL HIGH (ref 70–99)

## 2017-12-23 LAB — BASIC METABOLIC PANEL
Anion gap: 11 (ref 5–15)
BUN: 11 mg/dL (ref 8–23)
CHLORIDE: 106 mmol/L (ref 98–111)
CO2: 24 mmol/L (ref 22–32)
Calcium: 9.5 mg/dL (ref 8.9–10.3)
Creatinine, Ser: 1.02 mg/dL — ABNORMAL HIGH (ref 0.44–1.00)
GFR calc Af Amer: 56 mL/min — ABNORMAL LOW (ref >60)
GFR calc non Af Amer: 48 mL/min — ABNORMAL LOW (ref >60)
Glucose, Bld: 167 mg/dL — ABNORMAL HIGH (ref 70–99)
POTASSIUM: 3.8 mmol/L (ref 3.5–5.1)
SODIUM: 141 mmol/L (ref 135–145)

## 2017-12-23 MED ORDER — LEVETIRACETAM IN NACL 500 MG/100ML IV SOLN
500.0000 mg | Freq: Two times a day (BID) | INTRAVENOUS | Status: DC
  Administered 2017-12-24 – 2017-12-27 (×7): 500 mg via INTRAVENOUS
  Filled 2017-12-23 (×7): qty 100

## 2017-12-23 NOTE — Progress Notes (Signed)
  Speech Language Pathology Treatment: Dysphagia  Patient Details Name: Abigail Boyd MRN: [id number] DOB: January 14, 1930 Today's Date: 12/23/2017 Time: 0802-2336 SLP Time Calculation (min) (ACUTE ONLY): 10 min  Assessment / Plan / Recommendation Clinical Impression  Pt seen for follow-up to assess PO readiness. Diet orders remain regular; RN has continued to hold trays. Pt is alert, confused. As SLP approached, she raised her hands defensively. Pt relaxed as SLP spoke in a comforting voice, and held pt's hand gently. Pt opened her mouth briefly on command, but did not make any attempts to respond to questions with gestures or speech. Clamps her jaw, lips together tightly when objects presented to oral cavity (oral care supplies, spoon with ice chip). She turns her head, refusing ice chip. Oral cavity appears dry; difficult to assess dentition or visualize mucosa. Several reflexive swallows noted, though pt does not accept any POs despite total A. Continue to recommend NPO; d/w RN. Will follow up for PO readiness pending improvements in mentation and ability to follow commands.    HPI HPI: 84 y.o.femaleWith PMH stroke (2016, May 2019), PAF, OSA, HLD, HTN, DM2, CKD 2 who presented to [facility] for right facial droop and aphasia. CT/MRI negative for acute process; concern for partial seizure. EEG results pending. Pt does have a history of dysphagia, had MBS 10/01/17 showing mild oropharyngeal dysphagia characterized by mild disorganized oral management and delayed pharyngeal swallow initiation; advancement to regular diet with thin liquids was recommended.       SLP Plan  Continue with current plan of care       Recommendations  Diet recommendations: NPO Medication Administration: Via alternative means                Oral Care Recommendations: Oral care QID Follow up Recommendations: Other (comment)(tbd) SLP Visit Diagnosis: Dysphagia, oropharyngeal phase (R13.12) Plan: Continue with current plan  of care       [place], [place], [place] Pathologist Acute Rehabilitation Services Pager: [phone] Office: [phone]   Aliene Altes 12/23/2017, 8:55 AM

## 2017-12-23 NOTE — Progress Notes (Signed)
PROGRESS NOTE    Abigail Boyd  QQP:[id number] DOB: [birth year] DOA: 12/21/2017 PCP: Leone Haven, MD  Outpatient Specialists:     Brief Narrative:   84 year old lady with medical history significant for but not limited to previous CVA in 2016 with respiratory fibrillation on Xarelto, pulse sleep apnea hypertension presenting with right facial droop, aphasia associated with mild jerking movements and  waxing and waning gaze deviation,  Intermittently following commands and not following commands. MRI brain negative for acute infarct or intracranial process.    Neurology consulted noted better for possible process seizures    Assessment & Plan:   Principal Problem:   Seizure disorder [facility]) Active Problems:   Essential hypertension   Long term current use of anticoagulant therapy   Obstructive sleep apnea   CKD stage 2 due to type 2 diabetes mellitus ([place])   GERD (gastroesophageal reflux disease)   Diabetes mellitus type 2 with neurological manifestations ([place])   ?? seizures  continue Keppra  seizure precautions  NPO -  Swallowing eval per speech   rule out underlying infection - urinalysis negative, chest x-ray- negative  Route EEG 12/22/17- normal in awake and drowsy state only. Recommend 24 hour EEG but will defer to neurology - yet to be seen today   Paroxysmal AFib  Xarelto held initially but resumed with negative MRI    Diabetes Mellitus  Sliding scale insulin  Oral hypoglycemics on hold for now   DVT prophylaxis:  Xarelto Code Status:  DNR Family Communication:  Disposition Plan:  To be determined   Consultants:    neurology   Procedures:  EEG 12/22/17  Antimicrobials:    Subjective: No acute events noted overnight - NPO per speech eval.   Objective: Vitals:   12/22/17 1945 12/23/17 0013 12/23/17 0424 12/23/17 0900  BP: (!) 158/113 (!) 183/78 (!) 185/95 (!) 160/91  Pulse: 90 96 (!) 106   Resp: 20     Temp: 98.6 F (37 C) 98.9 F  (37.2 C) 99 F (37.2 C)   TempSrc: Axillary Axillary Axillary   SpO2: 96% 95% 95%   Weight:      Height:        Intake/Output Summary (Last 24 hours) at 12/23/2017 1140 Last data filed at 12/23/2017 0900 Gross per 24 hour  Intake 1207.97 ml  Output 500 ml  Net 707.97 ml   Filed Weights   12/21/17 1814 12/22/17 0111  Weight: 78.7 kg 75.2 kg    Examination:  General exam: NAD,  comfortable  Respiratory system: Clear to auscultation. Respiratory effort normal. Cardiovascular system: S1 & S2 heard, RRR. No JVD, murmurs, rubs, gallops or clicks. No pedal edema. Gastrointestinal system: Abdomen is nondistended, soft and nontender. No organomegaly or masses felt. Normal bowel sounds heard. Central nervous system:  Intermittently lethargic but arousable, oriented x1, otherwise no focal neurological deficits. Extremities: Symmetric 5 x 5 power. Skin: No rashes, lesions or ulcers     Data Reviewed: I have personally reviewed following labs and imaging studies  CBC: Recent Labs  Lab 12/21/17 1746 12/21/17 1747 12/22/17 0344  WBC 10.4  --  10.1  NEUTROABS 6.9  --   --   HGB 11.5* 11.9* 11.4*  HCT 36.8 35.0* 36.2  MCV 100.3*  --  98.9  PLT 317  --  712   Basic Metabolic Panel: Recent Labs  Lab 12/21/17 1746 12/21/17 1747 12/22/17 0344 12/23/17 0511  NA 140 140 140 141  K 3.9 3.9 4.1 3.8  CL  100 99 105 106  CO2 27  --  26 24  GLUCOSE 87 89 65* 167*  BUN 16 19 16 11   CREATININE 1.22* 1.30* 1.12* 1.02*  CALCIUM 9.7  --  9.4 9.5   GFR: Estimated Creatinine Clearance: 40.2 mL/min (A) (by C-G formula based on SCr of 1.02 mg/dL (H)). Liver Function Tests: Recent Labs  Lab 12/21/17 1746 12/22/17 0344  AST 28 27  ALT 29 28  ALKPHOS 114 96  BILITOT 0.2* 0.5  PROT 6.6 6.4*  ALBUMIN 3.1* 3.2*   No results for input(s): LIPASE, AMYLASE in the last 168 hours. No results for input(s): AMMONIA in the last 168 hours. Coagulation Profile: Recent Labs  Lab  12/21/17 1746  INR 1.22   Cardiac Enzymes: No results for input(s): CKTOTAL, CKMB, CKMBINDEX, TROPONINI in the last 168 hours. BNP (last 3 results) No results for input(s): PROBNP in the last 8760 hours. HbA1C: No results for input(s): HGBA1C in the last 72 hours. CBG: Recent Labs  Lab 12/22/17 1718 12/22/17 1750 12/22/17 2128 12/23/17 0557 12/23/17 1121  GLUCAP 58* 127* 139* 163* 236*   Lipid Profile: No results for input(s): CHOL, HDL, LDLCALC, TRIG, CHOLHDL, LDLDIRECT in the last 72 hours. Thyroid Function Tests: Recent Labs    12/22/17 0344  TSH 3.223   Anemia Panel: No results for input(s): VITAMINB12, FOLATE, FERRITIN, TIBC, IRON, RETICCTPCT in the last 72 hours. Urine analysis:    Component Value Date/Time   COLORURINE YELLOW 12/22/2017 1530   APPEARANCEUR CLOUDY (A) 12/22/2017 1530   APPEARANCEUR Cloudy (A) 06/04/2017 1507   LABSPEC 1.011 12/22/2017 1530   PHURINE 9.0 (H) 12/22/2017 1530   GLUCOSEU NEGATIVE 12/22/2017 1530   GLUCOSEU NEGATIVE 08/19/2015 1630   HGBUR NEGATIVE 12/22/2017 [place] 12/22/2017 1530   BILIRUBINUR Negative 06/04/2017 [place] 12/22/2017 1530   PROTEINUR NEGATIVE 12/22/2017 1530   UROBILINOGEN 0.2 04/12/2017 1355   UROBILINOGEN 0.2 08/19/2015 1630   NITRITE NEGATIVE 12/22/2017 1530   LEUKOCYTESUR NEGATIVE 12/22/2017 1530   LEUKOCYTESUR 3+ (A) 06/04/2017 1507   Sepsis Labs: @LABRCNTIP (procalcitonin:4,lacticidven:4)  )No results found for this or any previous visit (from the past 240 hour(s)).       Radiology Studies: Mr Brain 73 Contrast  Result Date: 12/21/2017 CLINICAL DATA:  RIGHT facial droop, RIGHT arm drift and aphasia, last seen normal at 15 30 hours today. History of stroke, hypertension, hyperlipidemia and atrial fibrillation. EXAM: MRI HEAD WITHOUT CONTRAST TECHNIQUE: Multiplanar, multiecho pulse sequences of the brain and surrounding structures were obtained without  intravenous contrast. COMPARISON:  CT HEAD December 21, 2017 and MRI head Jul 22, 2017 FINDINGS: Motion degraded examination, some sequences are at least moderately motion degraded. INTRACRANIAL CONTENTS: No reduced diffusion to suggest acute ischemia. No susceptibility artifact to suggest hemorrhage. Moderate to severe parenchymal brain volume loss. Ex vacuo dilatation lateral ventricles due to multiple basal ganglia and thalami infarcts. Prominent basal ganglia perivascular spaces associated with chronic small vessel ischemic changes. Patchy to confluent supratentorial white matter T2 hyperintensities. Old small LEFT cerebellar infarct. No hydrocephalus. Mild RIGHT cerebral peduncle wallerian degeneration. No suspicious parenchymal signal, masses, mass effect. No abnormal extra-axial fluid collections. No extra-axial masses. VASCULAR: Normal major intracranial vascular flow voids present at skull base. SKULL AND UPPER CERVICAL SPINE: No abnormal sellar expansion. No suspicious calvarial bone marrow signal. Craniocervical junction maintained. SINUSES/ORBITS: Expansile mucocele LEFT sphenoid with obstructive RIGHT sinusitis.The included ocular globes and orbital contents are non-suspicious. Status post bilateral ocular lens  implants. OTHER: None. IMPRESSION: 1. Motion degraded examination.  No acute intracranial process. 2. Multiple old small supra- and infratentorial infarcts. 3. Moderate to severe parenchymal brain volume loss. Electronically Signed   By: Elon Alas M.D.   On: 12/21/2017 22:03   Dg Chest Port 1 View  Result Date: 12/22/2017 CLINICAL DATA:  In cephalopathy. EXAM: PORTABLE CHEST 1 VIEW COMPARISON:  04/23/2017 FINDINGS: Cardiac silhouette is normal in size. No mediastinal or hilar masses. Lungs are clear. Eventrated right hemidiaphragm is stable from the prior exam. No pleural effusion or pneumothorax. Skeletal structures are grossly intact. IMPRESSION: No active disease. Electronically  Signed   By: Lajean Manes M.D.   On: 12/22/2017 17:16   Ct Head Code Stroke Wo Contrast  Result Date: 12/21/2017 CLINICAL DATA:  Code stroke. EXAM: CT HEAD WITHOUT CONTRAST TECHNIQUE: Contiguous axial images were obtained from the base of the skull through the vertex without intravenous contrast. COMPARISON:  07/22/2017 MRI head.  07/21/2017 CT head and CTA head. FINDINGS: Brain: No evidence of acute infarction, hemorrhage, hydrocephalus, extra-axial collection or mass lesion/mass effect. Small chronic infarcts within bilateral thalami and the right caudate head are stable. Encephalomalacia involving the left lentiform nucleus, caudate body, and corona radiata corresponding to prior infarction. Stable chronic microvascular ischemic changes and volume loss of the brain. Vascular: Calcific atherosclerosis of carotid siphons. No hyperdense vessel identified. Skull: Normal. Negative for fracture or focal lesion. Sinuses/Orbits: Opacification of the sphenoid sinus with fluid level. Normal aeration of mastoid air cells. Bilateral intra-ocular lens replacement. Other: None. ASPECTS [facility] Stroke Program Early CT Score) - Ganglionic level infarction (caudate, lentiform nuclei, internal capsule, insula, M1-M3 cortex): 7 - Supraganglionic infarction (M4-M6 cortex): 3 Total score (0-10 with 10 being normal): 10 IMPRESSION: 1. No acute intracranial abnormality identified. 2. ASPECTS is 10. 3. Stable chronic microvascular ischemic changes, volume loss, and small chronic infarcts of the basal ganglia. 4. Chronic infarct of the left lentiform nucleus, caudate body, and corona radiata corresponding to the prior acute infarction on 07/22/2017 MRI of the brain. 5. Sphenoid sinus opacification with fluid level, progressed from prior study, compatible with acute sinusitis in the appropriate clinical setting. These results were communicated to Dr. Lorraine Lax at 6:04 pmon 10/25/2019by text page via the [facility] messaging system.  Electronically Signed   By: Kristine Garbe M.D.   On: 12/21/2017 18:07        Scheduled Meds: . atorvastatin  80 mg Oral Daily  . azelastine  2 spray Each Nare QHS  . divalproex  250 mg Oral QHS  . docusate sodium  100 mg Oral Daily  . furosemide  20 mg Oral Daily  . gabapentin  300 mg Oral TID  . insulin aspart  0-5 Units Subcutaneous QHS  . insulin aspart  0-9 Units Subcutaneous TID WC  . insulin aspart  22 Units Subcutaneous TID WC  . insulin glargine  75 Units Subcutaneous QHS  . latanoprost  1 drop Both Eyes QHS  . lidocaine  1 patch Transdermal Q24H  . loratadine  10 mg Oral Daily  . magnesium oxide  400 mg Oral QHS  . metFORMIN  500 mg Oral Q breakfast  . metoprolol tartrate  25 mg Oral BID  . MUSCLE RUB  1 application Topical TID  . omega-3 acid ethyl esters  1 g Oral Daily  . oxybutynin  5 mg Oral BID  . pantoprazole  40 mg Oral Daily  . PARoxetine  20 mg Oral Daily  . Rivaroxaban  15 mg Oral QPM  . rOPINIRole  0.5 mg Oral QHS  . timolol  1 drop Both Eyes QHS  . traMADol  50 mg Oral TID   Continuous Infusions: . dextrose 5 % and 0.9% NaCl 75 mL/hr at 12/22/17 1940  . levETIRAcetam 500 mg (12/23/17 1010)     LOS: 2 days    Time spent: 16    Benito Mccreedy, MD [name] Pager [phone]  If 7PM-7AM, please contact night-coverage [email or web address] Password [email or web address] 12/23/2017, 11:40 AM

## 2017-12-24 DIAGNOSIS — G9340 Encephalopathy, unspecified: Secondary | ICD-10-CM

## 2017-12-24 DIAGNOSIS — N182 Chronic kidney disease, stage 2 (mild): Secondary | ICD-10-CM

## 2017-12-24 DIAGNOSIS — E1122 Type 2 diabetes mellitus with diabetic chronic kidney disease: Secondary | ICD-10-CM

## 2017-12-24 DIAGNOSIS — E1149 Type 2 diabetes mellitus with other diabetic neurological complication: Secondary | ICD-10-CM

## 2017-12-24 LAB — GLUCOSE, CAPILLARY
GLUCOSE-CAPILLARY: 195 mg/dL — AB (ref 70–99)
GLUCOSE-CAPILLARY: 203 mg/dL — AB (ref 70–99)
GLUCOSE-CAPILLARY: 83 mg/dL (ref 70–99)
GLUCOSE-CAPILLARY: 102 mg/dL — AB (ref 70–99)
Glucose-Capillary: 198 mg/dL — ABNORMAL HIGH (ref 70–99)
Glucose-Capillary: 269 mg/dL — ABNORMAL HIGH (ref 70–99)
Glucose-Capillary: 114 mg/dL — ABNORMAL HIGH (ref 70–99)

## 2017-12-24 LAB — CBC
HCT: 37.6 % (ref 36.0–46.0)
Hemoglobin: 12.3 g/dL (ref 12.0–15.0)
MCH: 31.4 pg (ref 26.0–34.0)
MCHC: 32.7 g/dL (ref 30.0–36.0)
MCV: 95.9 fL (ref 80.0–100.0)
PLATELETS: 371 10*3/uL (ref 150–400)
RBC: 3.92 MIL/uL (ref 3.87–5.11)
RDW: 12.6 % (ref 11.5–15.5)
WBC: 13.6 10*3/uL — AB (ref 4.0–10.5)
nRBC: 0 % (ref 0.0–0.2)

## 2017-12-24 LAB — BASIC METABOLIC PANEL
Anion gap: 12 (ref 5–15)
BUN: 7 mg/dL — AB (ref 8–23)
CHLORIDE: 105 mmol/L (ref 98–111)
CO2: 23 mmol/L (ref 22–32)
CREATININE: 0.85 mg/dL (ref 0.44–1.00)
Calcium: 9.6 mg/dL (ref 8.9–10.3)
GFR calc Af Amer: >60 mL/min (ref >60)
Glucose, Bld: 101 mg/dL — ABNORMAL HIGH (ref 70–99)
POTASSIUM: 3.3 mmol/L — AB (ref 3.5–5.1)
SODIUM: 140 mmol/L (ref 135–145)

## 2017-12-24 MED ORDER — VALPROATE SODIUM 500 MG/5ML IV SOLN
250.0000 mg | Freq: Every day | INTRAVENOUS | Status: DC
  Administered 2017-12-24 – 2017-12-26 (×3): 250 mg via INTRAVENOUS
  Filled 2017-12-24 (×3): qty 2.5

## 2017-12-24 MED ORDER — HYDRALAZINE HCL 20 MG/ML IJ SOLN
5.0000 mg | INTRAMUSCULAR | Status: DC | PRN
  Administered 2017-12-25: 5 mg via INTRAVENOUS
  Filled 2017-12-24: qty 1

## 2017-12-24 MED ORDER — INSULIN ASPART 100 UNIT/ML ~~LOC~~ SOLN
0.0000 [IU] | Freq: Three times a day (TID) | SUBCUTANEOUS | Status: DC
  Administered 2017-12-25: 3 [IU] via SUBCUTANEOUS
  Administered 2017-12-25 (×2): 2 [IU] via SUBCUTANEOUS
  Administered 2017-12-26: 5 [IU] via SUBCUTANEOUS
  Administered 2017-12-26: 3 [IU] via SUBCUTANEOUS
  Administered 2017-12-26: 5 [IU] via SUBCUTANEOUS
  Administered 2017-12-27: 2 [IU] via SUBCUTANEOUS

## 2017-12-24 NOTE — Progress Notes (Signed)
  Speech Language Pathology Treatment: Dysphagia  Patient Details Name: Abigail Boyd MRN: [id number] DOB: 07-17-1929 Today's Date: 12/24/2017 Time: 2761-4709 SLP Time Calculation (min) (ACUTE ONLY): 20 min  Assessment / Plan / Recommendation Clinical Impression  Pt today accepting of po intake = she is sleepy but will awaken to SLP verbal/tactile stimulation.  Pt with intentional communication via yes/no regarding desire for more po.  Pt opened mouth to accept small ice chip, tsp thin water, cup bolus of nectar water and 1/2 tsp applesauce.  She presents with oral holding worse with increased viscocity.  No indication of aspiration and suspect pharyngeal strength is intact although may be mildly delayed (as noted on prior MBS 10/01/17).  Pt nods off with boluses in her oral cavity which increases aspiration risk significantly.    Given pt's continued decreased ability to stay alert, maintain attention and suboptimal positioning in bed, recommend npo x attempts at necessary po medication crushed or liquid form when pt is fully alert.  At this time, largest barrier to po intake is her mentation.  Hopeful for continued improvement and thus po diet initiation.  No family present to educate.     HPI HPI: 84 y.o.femaleWith PMH stroke (2016, May 2019), PAF, OSA, HLD, HTN, DM2, CKD 2 who presented to [facility] for right facial droop and aphasia. CT/MRI negative for acute process; concern for partial seizure. EEG results pending. Pt does have a history of dysphagia, had MBS 10/01/17 showing mild oropharyngeal dysphagia characterized by mild disorganized oral management and delayed pharyngeal swallow initiation; advancement to regular diet with thin liquids was recommended.       SLP Plan  Continue with current plan of care       Recommendations  Diet recommendations: NPO;Other(comment)(x medicine with puree when fully alert, tsps water and single small ice chips) Liquids provided via: Teaspoon Medication  Administration: Crushed with puree Supervision: (total assist) Compensations: Slow rate;Small sips/bites(assure pt swallows, delayed swallow noted) Postural Changes and/or Swallow Maneuvers: Seated upright 90 degrees;Upright 30-60 min after meal                Oral Care Recommendations: Oral care QID Follow up Recommendations: Other (comment)(tbd) SLP Visit Diagnosis: Dysphagia, oral phase (R13.11) Plan: Continue with current plan of care       [place], [place] [facility] SLP Acute Rehab Services Pager [phone] Office [phone]   Macario Golds 12/24/2017, 9:17 AM

## 2017-12-24 NOTE — Evaluation (Signed)
Occupational Therapy Evaluation Patient Details Name: Abigail Boyd MRN: [id number] DOB: 08/24/1929 Today's Date: 12/24/2017    History of Present Illness Patient is an 84 y/o female presenting to the ED on 12/21/17 with R facila droop and aphasia. Head CT without contrast is negative. 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 Continued work-up. PMH significant for CVA in 2016, obstructive sleep apnea, paroxysmal atrial fibrillation on chronic anticoagulation, hypertension, diabetes, chronic kidney disease stage II and hyperlipidemia.   Clinical Impression   Patient presents supine in bed.  Per chart review, resident of [facility]; pt unable to provide PLOF information.  Agreeable to participate with OT, nodding head. Admitted for above and at this time requires total assist +2 for bed mobility and total assist for self care tasks at this time. Pt presents with rigidity throughout session, increased time demonstrates ability to grasp with B hands and functionally/purposefully reach with L UE to assist with rolling given guiding support. Pt follows approximately 10% of commands initially, when fatigued guarded movement with LUE.  Pt will benefit from continued OT services while admitted and at SNF level at dc.  Will continue to follow.     Follow Up Recommendations  SNF;Supervision/Assistance - 24 hour    Equipment Recommendations  Other (comment)(TBD at next venue of care)    Recommendations for Other Services       Precautions / Restrictions Precautions Precautions: Fall Restrictions Weight Bearing Restrictions: No      Mobility Bed Mobility Overal bed mobility: Needs Assistance Bed Mobility: Rolling Rolling: Total assist;+2 for physical assistance         General bed mobility comments: total A +2 for bed level mobility; poor initiation of movement  Transfers                 General  transfer comment: deferred    Balance                                           ADL either performed or assessed with clinical judgement   ADL Overall ADL's : Needs assistance/impaired     Grooming: Total assistance;Bed level           Upper Body Dressing : Total assistance;Bed level           Toileting- Clothing Manipulation and Hygiene: Total assistance;+2 for physical assistance;Bed level Toileting - Clothing Manipulation Details (indicate cue type and reason): soiled upon entry, +2 assist for hygiene bed level     Functional mobility during ADLs: Total assistance;+2 for physical assistance General ADL Comments: at this time patient requires total assist for all ADLs, attempted hand over hand assist for washing face and patient guards against     Vision   Additional Comments: difficult to assess, patient able to visually follow therapist around room; disconnecting eyes from head turn     Perception     Praxis      Pertinent Vitals/Pain Pain Assessment: Faces Faces Pain Scale: Hurts a little bit Pain Location: generalized Pain Descriptors / Indicators: Grimacing;Guarding Pain Intervention(s): Limited activity within patient's tolerance;Monitored during session     Hand Dominance     Extremity/Trunk Assessment Upper Extremity Assessment Upper Extremity Assessment: RUE deficits/detail;LUE deficits/detail RUE Deficits / Details: very rigid, extensor tone and does not allow PROM, able to grasp with cueing  LUE  Deficits / Details: able to grasp with cueing, allows for gentle PROM and able to control descend of UE to bed (grossly 3+/5 shoulder MMT)   Lower Extremity Assessment Lower Extremity Assessment: Defer to PT evaluation       Communication Communication Communication: Expressive difficulties   Cognition Arousal/Alertness: Awake/alert Behavior During Therapy: Flat affect Overall Cognitive Status: Difficult to assess Area of  Impairment: Following commands                       Following Commands: Follows one step commands inconsistently;Follows one step commands with increased time       General Comments: pt able to follow 1 step commands inconsistently, approx 10% of the time.    General Comments       Exercises     Shoulder Instructions      Home Living Family/patient expects to be discharged to:: Skilled nursing facility                                 Additional Comments: patient unable to provide PLOF at this time.       Prior Functioning/Environment          Comments: pt unable to provide PLOF at this time         OT Problem List: Decreased strength;Decreased range of motion;Decreased activity tolerance;Impaired balance (sitting and/or standing);Decreased coordination;Decreased cognition;Decreased safety awareness;Decreased knowledge of use of DME or AE;Decreased knowledge of precautions;Impaired tone;Impaired UE functional use      OT Treatment/Interventions: Self-care/ADL training;Neuromuscular education;Manual therapy;Therapeutic activities;Cognitive remediation/compensation;Balance training;Patient/family education    OT Goals(Current goals can be found in the care plan section) Acute Rehab OT Goals Patient Stated Goal: none stated OT Goal Formulation: Patient unable to participate in goal setting Time For Goal Achievement: 01/07/18 Potential to Achieve Goals: Good  OT Frequency: Min 2X/week   Barriers to D/C:            Co-evaluation              AM-PAC PT "6 Clicks" Daily Activity     Outcome Measure Help from another person eating meals?: Total Help from another person taking care of personal grooming?: Total Help from another person toileting, which includes using toliet, bedpan, or urinal?: Total Help from another person bathing (including washing, rinsing, drying)?: Total Help from another person to put on and taking off regular upper  body clothing?: Total Help from another person to put on and taking off regular lower body clothing?: Total 6 Click Score: 6   End of Session Nurse Communication: Mobility status  Activity Tolerance: Patient tolerated treatment well Patient left: in bed;with call bell/phone within reach;with bed alarm set;with nursing/sitter in room  OT Visit Diagnosis: Other abnormalities of gait and mobility (R26.89);Muscle weakness (generalized) (M62.81);Other symptoms and signs involving the nervous system (R29.898);Other symptoms and signs involving cognitive function;Cognitive communication deficit (R41.841) Symptoms and signs involving cognitive functions: Other cerebrovascular disease                Time: 1459-1535 OT Time Calculation (min): 36 min Charges:  OT General Charges $OT Visit: 1 Visit OT Evaluation $OT Eval High Complexity: 1 High OT Treatments $Self Care/Home Management : 8-22 mins  Delight Stare, OT Acute Rehabilitation Services Pager [phone] Office [phone]   Delight Stare 12/24/2017, 5:08 PM

## 2017-12-24 NOTE — Progress Notes (Signed)
Palliative Medicine consult noted. Due to high referral volume, there may be a delay seeing this patient. Please call the Palliative Medicine Team office at [phone] if recommendations are needed in the interim.  Thank you for inviting [place] to see this patient.  Marjie Skiff Conleigh Heinlein, RN, BSN, [facility] Palliative Medicine Team 12/24/2017 3:24 PM Office [phone]

## 2017-12-24 NOTE — Evaluation (Signed)
Physical Therapy Evaluation Patient Details Name: Abigail Boyd MRN: [id number] DOB: Jun 27, 1929 Today's Date: 12/24/2017   History of Present Illness  Patient is an 84 y/o female presenting to the ED on 12/21/17 with R facila droop and aphasia. Head CT without contrast is negative. 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 Continued work-up. PMH significant for CVA in 2016, obstructive sleep apnea, paroxysmal atrial fibrillation on chronic anticoagulation, hypertension, diabetes, chronic kidney disease stage II and hyperlipidemia.    Clinical Impression  Abigail Boyd admitted with the above listed diagnosis. Per patient chart, prior resident of [facility]. Patient today requiring Total A +2 for bed level mobility, with difficulty initiating mobility and following one-step commands. Will recommend SNF at discharge to continue to progress mobility. PT to follow acutely to address, bed mobility, transfers, functional mobility, balance, strength.     Follow Up Recommendations SNF;Supervision/Assistance - 24 hour    Equipment Recommendations  Other (comment)(defer)    Recommendations for Other Services OT consult     Precautions / Restrictions Precautions Precautions: Fall Restrictions Weight Bearing Restrictions: No      Mobility  Bed Mobility Overal bed mobility: Needs Assistance Bed Mobility: Rolling Rolling: Total assist;+2 for physical assistance         General bed mobility comments: total A +2 for bed level mobility; poor initiation of movement  Transfers                 General transfer comment: deferred  Ambulation/Gait                Stairs            Wheelchair Mobility    Modified Rankin (Stroke Patients Only) Modified Rankin (Stroke Patients Only) Pre-Morbid Rankin Score: Moderately severe disability Modified Rankin: Severe disability     Balance                                             Pertinent Vitals/Pain Pain Assessment: Faces Faces Pain Scale: Hurts little more Pain Location: generalized Pain Descriptors / Indicators: Crying(patient appearing to cry with movement) Pain Intervention(s): Limited activity within patient's tolerance;Monitored during session    [place] expects to be discharged to:: Skilled nursing facility                 Additional Comments: patient unable to provide PLOF at this time.     Prior Function                 Hand Dominance        Extremity/Trunk Assessment   Upper Extremity Assessment Upper Extremity Assessment: Defer to OT evaluation    Lower Extremity Assessment Lower Extremity Assessment: Generalized weakness;RLE deficits/detail;LLE deficits/detail RLE Deficits / Details: very stiff, does not allow for PROM LLE Deficits / Details: very stiff, does not allow for PROM       Communication   Communication: Expressive difficulties  Cognition Arousal/Alertness: Awake/alert Behavior During Therapy: Flat affect Overall Cognitive Status: Difficult to assess                                 General Comments: reduced ability to follow one step commands; seemingly begin to cry with bed mobility, no tears  General Comments      Exercises     Assessment/Plan    PT Assessment Patient needs continued PT services  PT Problem List Decreased strength;Decreased activity tolerance;Decreased balance;Decreased mobility;Decreased knowledge of use of DME;Decreased safety awareness       PT Treatment Interventions DME instruction;Gait training;Functional mobility training;Therapeutic activities;Therapeutic exercise;Balance training;Neuromuscular re-education;Patient/family education    PT Goals (Current goals can be found in the Care Plan section)  Acute Rehab PT Goals Patient Stated Goal: none stated PT Goal Formulation: Patient  unable to participate in goal setting Time For Goal Achievement: 01/07/18 Potential to Achieve Goals: Fair    Frequency Min 2X/week   Barriers to discharge        Co-evaluation               AM-PAC PT "6 Clicks" Daily Activity  Outcome Measure Difficulty turning over in bed (including adjusting bedclothes, sheets and blankets)?: Unable Difficulty moving from lying on back to sitting on the side of the bed? : Unable Difficulty sitting down on and standing up from a chair with arms (e.g., wheelchair, bedside commode, etc,.)?: Unable Help needed moving to and from a bed to chair (including a wheelchair)?: Total Help needed walking in hospital room?: Total Help needed climbing 3-5 steps with a railing? : Total 6 Click Score: 6    End of Session   Activity Tolerance: Patient tolerated treatment well Patient left: in bed;with call bell/phone within reach;with bed alarm set Nurse Communication: Mobility status PT Visit Diagnosis: Unsteadiness on feet (R26.81);Other abnormalities of gait and mobility (R26.89);Muscle weakness (generalized) (M62.81)    Time: 8003-4917 PT Time Calculation (min) (ACUTE ONLY): 18 min   Charges:   PT Evaluation $PT Eval Moderate Complexity: 1 Mod          Lanney Gins, PT, DPT Supplemental Physical Therapist 12/24/17 11:54 AM Pager: [phone] Office: [phone]

## 2017-12-24 NOTE — Care Management Important Message (Signed)
Important Message  Patient Details  Name: Abigail Boyd MRN: [id number] Date of Birth: [birth year]   Medicare Important Message Given:  No Precaution in placeno IM given   Orbie Pyo 12/24/2017, 3:13 PM

## 2017-12-24 NOTE — Progress Notes (Signed)
RN received a phone call from infection prevention to put patient on contact for ESBL and add orders to indicate contact precation

## 2017-12-24 NOTE — NC FL2 (Signed)
[place] MEDICAID FL2 LEVEL OF CARE SCREENING TOOL     IDENTIFICATION  Patient Name: Abigail Boyd Birthdate: 31-Jul-1929 Sex: female Admission Date (Current Location): 12/21/2017  [facility] and [place] Number:  Engineering geologist and Address:  [facility] [place] [address] [place], [place] [address]      Provider Number: [id number]  Attending Physician Name and Address:  Hosie Poisson, MD  Relative Name and Phone Number:       Current Level of Care: Hospital Recommended Level of Care: [place] Prior Approval Number:    Date Approved/Denied:   PASRR Number:    Discharge Plan: SNF    Current Diagnoses: Patient Active Problem List   Diagnosis Date Noted  . Seizure disorder ([place]) 12/21/2017  . Chronic bilateral low back pain with right-sided sciatica 10/15/2017  . Hypertensive heart disease with congestive heart failure and stage 2 kidney disease ([place]) 09/06/2017  . Diabetes mellitus type 2 with neurological manifestations ([place]) 09/06/2017  . Diabetic neuropathy ([place]) 09/06/2017  . UI (urinary incontinence) 09/06/2017  . Chronic glaucoma 09/06/2017  . Acute CVA (cerebrovascular accident) ([place]) 07/23/2017  . Acute encephalopathy 07/21/2017  . Chronic diastolic CHF (congestive heart failure) ([place]) 07/21/2017  . Stroke ([place]) 06/27/2017  . Generalized weakness 04/23/2017  . Recurrent UTI 04/23/2017  . Anxiety 12/13/2015  . Allergic rhinitis 12/13/2015  . Urge incontinence 03/15/2015  . Constipation 03/15/2015  . Right knee pain 03/15/2015  . CKD stage 2 due to type 2 diabetes mellitus ([place])   . PLMD (periodic limb movement disorder) 06/12/2013  . Obstructive sleep apnea 03/05/2013  . Tachy-brady syndrome: Rates range from 50-169 bpm in A. fib 12/20/2012  . History of syncope: Unclear etiology 12/19/2012  . PAF (paroxysmal atrial fibrillation) (HCC);  CHA2DS2-VASc Score =6 on 15 mg Xarelto 05/16/2012    Class: Diagnosis of  .  Long term current use of anticoagulant therapy 05/16/2012  . CAD-PCI OM1, 100% RCA (L-R colllaterals) 07/07/2011  . Essential hypertension     Class: Diagnosis of  . Dyslipidemia associated with type 2 diabetes mellitus (HCC)     Class: Diagnosis of  . Benign endometrial hyperplasia 03/21/2011  . Diverticulitis 03/21/2011  . GERD (gastroesophageal reflux disease) 03/21/2011  . Glaucoma (increased eye pressure) 03/21/2011  . Goiter 03/21/2011  . History of carpal tunnel syndrome 03/21/2011  . Osteopenia 03/21/2011  . Vitamin D deficiency disease 03/21/2011    Orientation RESPIRATION BLADDER Height & Weight     Self  Normal Incontinent Weight: 165 lb 12.6 oz (75.2 kg) Height:  5\' 5"  (165.1 cm)  BEHAVIORAL SYMPTOMS/MOOD NEUROLOGICAL BOWEL NUTRITION STATUS      Incontinent Diet(see DC summary)  AMBULATORY STATUS COMMUNICATION OF NEEDS Skin   Total Care Non-Verbally Normal                       Personal Care Assistance Level of Assistance  Bathing, Feeding, Dressing Bathing Assistance: Maximum assistance Feeding assistance: Maximum assistance Dressing Assistance: Maximum assistance     Functional Limitations Info  Sight, Hearing, Speech Sight Info: Adequate Hearing Info: Adequate Speech Info: Impaired    SPECIAL CARE FACTORS FREQUENCY                       Contractures Contractures Info: Not present    Additional Factors Info  Code Status, Allergies, Insulin Sliding Scale Code Status Info: DNR Allergies Info: Amlodipine, Clopidogrel Bisulfate, Kenalog Triamcinolone Acetonide, Lisinopril,  Losartan, Sulfa Antibiotics   Insulin Sliding Scale Info: Novolog 22 units 3x/day with meals; Novolog daily at bedtime; Lantus 75 units daily at bed       Current Medications (12/24/2017):  This is the current hospital active medication list Current Facility-Administered Medications  Medication Dose Route Frequency Provider Last Rate Last Dose  . acetaminophen  (TYLENOL) tablet 650 mg  650 mg Oral Q6H PRN Gala Romney L, MD      . atorvastatin (LIPITOR) tablet 80 mg  80 mg Oral Daily Garba, Mohammad L, MD      . azelastine (ASTELIN) 0.1 % nasal spray 2 spray  2 spray Each Nare QHS Elwyn Reach, MD   2 spray at 12/23/17 2119  . dextrose 5 %-0.9 % sodium chloride infusion   Intravenous Continuous Osei-Bonsu, Iona Beard, MD 75 mL/hr at 12/23/17 2200    . divalproex (DEPAKOTE) DR tablet 250 mg  250 mg Oral QHS Garba, Mohammad L, MD      . docusate sodium (COLACE) capsule 100 mg  100 mg Oral Daily Garba, Mohammad L, MD      . furosemide (LASIX) tablet 20 mg  20 mg Oral Daily Garba, Mohammad L, MD      . gabapentin (NEURONTIN) capsule 300 mg  300 mg Oral TID Gala Romney L, MD      . hydrALAZINE (APRESOLINE) injection 5 mg  5 mg Intravenous Q4H PRN Hosie Poisson, MD      . insulin aspart (novoLOG) injection 0-15 Units  0-15 Units Subcutaneous TID WC Akula, Vijaya, MD      . insulin aspart (novoLOG) injection 0-5 Units  0-5 Units Subcutaneous QHS Garba, Mohammad L, MD      . insulin aspart (novoLOG) injection 22 Units  22 Units Subcutaneous TID WC Elwyn Reach, MD   22 Units at 12/24/17 1237  . insulin glargine (LANTUS) injection 75 Units  75 Units Subcutaneous QHS Elwyn Reach, MD   75 Units at 12/23/17 2231  . latanoprost (XALATAN) 0.005 % ophthalmic solution 1 drop  1 drop Both Eyes QHS Elwyn Reach, MD   1 drop at 12/23/17 2119  . levETIRAcetam (KEPPRA) IVPB 500 mg/100 mL premix  500 mg Intravenous BID Osei-Bonsu, Iona Beard, MD 400 mL/hr at 12/24/17 1055 500 mg at 12/24/17 1055  . lidocaine (LIDODERM) 5 % 1 patch  1 patch Transdermal Q24H Elwyn Reach, MD   1 patch at 12/24/17 1049  . loratadine (CLARITIN) tablet 10 mg  10 mg Oral Daily Garba, Mohammad L, MD      . magnesium oxide (MAG-OX) tablet 400 mg  400 mg Oral QHS Garba, Mohammad L, MD      . metFORMIN (GLUCOPHAGE-XR) 24 hr tablet 500 mg  500 mg Oral Q breakfast Jonelle Sidle, Mohammad  L, MD      . metoprolol tartrate (LOPRESSOR) tablet 25 mg  25 mg Oral BID Gala Romney L, MD      . MUSCLE RUB CREA 1 application  1 application Topical TID Elwyn Reach, MD   1 application at 08/27/14 1045  . nitroGLYCERIN (NITROSTAT) SL tablet 0.4 mg  0.4 mg Sublingual Q5 min PRN Gala Romney L, MD      . omega-3 acid ethyl esters (LOVAZA) capsule 1 g  1 g Oral Daily Garba, Mohammad L, MD      . ondansetron (ZOFRAN) tablet 4 mg  4 mg Oral Q6H PRN Elwyn Reach, MD       Or  .  ondansetron (ZOFRAN) injection 4 mg  4 mg Intravenous Q6H PRN Elwyn Reach, MD      . oxybutynin (DITROPAN) tablet 5 mg  5 mg Oral BID Jonelle Sidle, Mohammad L, MD      . pantoprazole (PROTONIX) EC tablet 40 mg  40 mg Oral Daily Garba, Mohammad L, MD      . PARoxetine (PAXIL) tablet 20 mg  20 mg Oral Daily Garba, Mohammad L, MD      . polyethylene glycol (MIRALAX / GLYCOLAX) packet 17 g  17 g Oral Daily PRN Elwyn Reach, MD      . Rivaroxaban (XARELTO) tablet 15 mg  15 mg Oral QPM Garba, Mohammad L, MD      . rOPINIRole (REQUIP) tablet 0.5 mg  0.5 mg Oral QHS Garba, Mohammad L, MD      . timolol (TIMOPTIC) 0.5 % ophthalmic solution 1 drop  1 drop Both Eyes QHS Elwyn Reach, MD   1 drop at 12/23/17 2119  . traMADol (ULTRAM) tablet 50 mg  50 mg Oral TID Elwyn Reach, MD      . traMADol Veatrice Bourbon) tablet 50 mg  50 mg Oral Daily PRN Elwyn Reach, MD         Discharge Medications: Please see discharge summary for a list of discharge medications.  Relevant Imaging Results:  Relevant Lab Results:   Additional Information SS#: [id number]  Geralynn Ochs, LCSW

## 2017-12-24 NOTE — Progress Notes (Signed)
PROGRESS NOTE    Abigail Boyd  LGX:[id number] DOB: 06-05-1929 DOA: 12/21/2017 PCP: Leone Haven, MD    Brief Narrative:  84 year old lady with medical history significant for but not limited to previous CVA in 2016 with respiratory fibrillation on Xarelto, pulse sleep apnea hypertension presenting with right facial droop, aphasia associated with mild jerking movements. She was admitted for evaluation of stroke and seizures. Neurology consulted.    Assessment & Plan:   Principal Problem:   Seizure disorder ([place]) Active Problems:   Essential hypertension   Long term current use of anticoagulant therapy   Obstructive sleep apnea   CKD stage 2 due to type 2 diabetes mellitus (HCC)   GERD (gastroesophageal reflux disease)   Diabetes mellitus type 2 with neurological manifestations (HCC)  Seizures:  None since last night, pt is alert but not responding to verbal cues and not at all following commands.  EEG does not show epileptiform activity in awake and drowsy state.  Currently on keppra IV.  Currently no signs of infection with negative CXR and UA.  Neurology consulted and recommendations given.    Acute encephalopathy:  Unclear etiology, she does not appear to be post ictal. Unaware of pateint's basline. Could be metabolic encephalopathy from dehydration and seizures.  She is alert but not following commands.    Hypertension:  Sub optimal.  Will resume home meds and do prn hydralazine.    Diabetes Mellitus: CBG (last 3)  Recent Labs    12/23/17 1727 12/23/17 2218 12/24/17 1147  GLUCAP 219* 198* 269*   Increase to moderate SSI.  Resume home meds.    Mild normocytic anemia; Hemoglobin stable around 11.    Stage 2 CKD: CREATININE at baseline.   Paroxysmal atrial fibrillation:  Rate controlled.  Resume metoprolol for rate control and xarelto for anti coagulation.    GERD; on protonix.   DVT prophylaxis: scd's Code Status:DNR Family Communication:  none at bedside.  Disposition Plan: pending clinical improvement and palliative care consult.   Consultants:   Neurology.   Procedures: EEG  Antimicrobials: NONE  Subjective: Alert,b ut not following commands.  She appears comfortable.    Objective: Vitals:   12/23/17 2017 12/24/17 0045 12/24/17 0626 12/24/17 0932  BP: (!) 158/85 (!) 158/73  (!) 178/88  Pulse: (!) 105 96 95 98  Resp: 18 20 18 17   Temp: 98.1 F (36.7 C) 98.8 F (37.1 C) 98.1 F (36.7 C) 98.2 F (36.8 C)  TempSrc: Oral Oral Oral Oral  SpO2: 96% 100% 98% 99%  Weight:      Height:        Intake/Output Summary (Last 24 hours) at 12/24/2017 1139 Last data filed at 12/23/2017 1626 Gross per 24 hour  Intake 859.96 ml  Output 600 ml  Net 259.96 ml   Filed Weights   12/21/17 1814 12/22/17 0111  Weight: 78.7 kg 75.2 kg    Examination:  General exam: Appears calm and comfortable  Respiratory system: Clear to auscultation. Respiratory effort normal. Cardiovascular system: S1 & S2 heard, RRR.Marland Kitchen No pedal edema. Gastrointestinal system: Abdomen is nondistended, soft and nontender. No organomegaly or masses felt. Normal bowel sounds heard. Central nervous system: Alert not following commands.  Extremities: Symmetric 5 x 5 power. Skin: No rashes, lesions or ulcers Psychiatry: cannot be assessed.     Data Reviewed: I have personally reviewed following labs and imaging studies  CBC: Recent Labs  Lab 12/21/17 1746 12/21/17 1747 12/22/17 0344  WBC 10.4  --  10.1  NEUTROABS 6.9  --   --   HGB 11.5* 11.9* 11.4*  HCT 36.8 35.0* 36.2  MCV 100.3*  --  98.9  PLT 317  --  858   Basic Metabolic Panel: Recent Labs  Lab 12/21/17 1746 12/21/17 1747 12/22/17 0344 12/23/17 0511  NA 140 140 140 141  K 3.9 3.9 4.1 3.8  CL 100 99 105 106  CO2 27  --  26 24  GLUCOSE 87 89 65* 167*  BUN 16 19 16 11   CREATININE 1.22* 1.30* 1.12* 1.02*  CALCIUM 9.7  --  9.4 9.5   GFR: Estimated Creatinine Clearance:  40.2 mL/min (A) (by C-G formula based on SCr of 1.02 mg/dL (H)). Liver Function Tests: Recent Labs  Lab 12/21/17 1746 12/22/17 0344  AST 28 27  ALT 29 28  ALKPHOS 114 96  BILITOT 0.2* 0.5  PROT 6.6 6.4*  ALBUMIN 3.1* 3.2*   No results for input(s): LIPASE, AMYLASE in the last 168 hours. No results for input(s): AMMONIA in the last 168 hours. Coagulation Profile: Recent Labs  Lab 12/21/17 1746  INR 1.22   Cardiac Enzymes: No results for input(s): CKTOTAL, CKMB, CKMBINDEX, TROPONINI in the last 168 hours. BNP (last 3 results) No results for input(s): PROBNP in the last 8760 hours. HbA1C: No results for input(s): HGBA1C in the last 72 hours. CBG: Recent Labs  Lab 12/22/17 2128 12/23/17 0557 12/23/17 1121 12/23/17 1727 12/23/17 2218  GLUCAP 139* 163* 236* 219* 198*   Lipid Profile: No results for input(s): CHOL, HDL, LDLCALC, TRIG, CHOLHDL, LDLDIRECT in the last 72 hours. Thyroid Function Tests: Recent Labs    12/22/17 0344  TSH 3.223   Anemia Panel: No results for input(s): VITAMINB12, FOLATE, FERRITIN, TIBC, IRON, RETICCTPCT in the last 72 hours. Sepsis Labs: No results for input(s): PROCALCITON, LATICACIDVEN in the last 168 hours.  No results found for this or any previous visit (from the past 240 hour(s)).       Radiology Studies: Dg Chest Port 1 View  Result Date: 12/22/2017 CLINICAL DATA:  In cephalopathy. EXAM: PORTABLE CHEST 1 VIEW COMPARISON:  04/23/2017 FINDINGS: Cardiac silhouette is normal in size. No mediastinal or hilar masses. Lungs are clear. Eventrated right hemidiaphragm is stable from the prior exam. No pleural effusion or pneumothorax. Skeletal structures are grossly intact. IMPRESSION: No active disease. Electronically Signed   By: Lajean Manes M.D.   On: 12/22/2017 17:16        Scheduled Meds: . atorvastatin  80 mg Oral Daily  . azelastine  2 spray Each Nare QHS  . divalproex  250 mg Oral QHS  . docusate sodium  100 mg Oral  Daily  . furosemide  20 mg Oral Daily  . gabapentin  300 mg Oral TID  . insulin aspart  0-5 Units Subcutaneous QHS  . insulin aspart  0-9 Units Subcutaneous TID WC  . insulin aspart  22 Units Subcutaneous TID WC  . insulin glargine  75 Units Subcutaneous QHS  . latanoprost  1 drop Both Eyes QHS  . lidocaine  1 patch Transdermal Q24H  . loratadine  10 mg Oral Daily  . magnesium oxide  400 mg Oral QHS  . metFORMIN  500 mg Oral Q breakfast  . metoprolol tartrate  25 mg Oral BID  . MUSCLE RUB  1 application Topical TID  . omega-3 acid ethyl esters  1 g Oral Daily  . oxybutynin  5 mg Oral BID  . pantoprazole  40 mg  Oral Daily  . PARoxetine  20 mg Oral Daily  . Rivaroxaban  15 mg Oral QPM  . rOPINIRole  0.5 mg Oral QHS  . timolol  1 drop Both Eyes QHS  . traMADol  50 mg Oral TID   Continuous Infusions: . dextrose 5 % and 0.9% NaCl 75 mL/hr at 12/23/17 2200  . levETIRAcetam 500 mg (12/24/17 1055)     LOS: 3 days    Time spent: 35 minutes    Hosie Poisson, MD [name] Pager [phone]   If 7PM-7AM, please contact night-coverage [email or web address] Password [facility] 12/24/2017, 11:39 AM

## 2017-12-25 ENCOUNTER — Inpatient Hospital Stay (HOSPITAL_COMMUNITY): Payer: Medicare Other

## 2017-12-25 LAB — GLUCOSE, CAPILLARY
GLUCOSE-CAPILLARY: 132 mg/dL — AB (ref 70–99)
GLUCOSE-CAPILLARY: 181 mg/dL — AB (ref 70–99)
Glucose-Capillary: 140 mg/dL — ABNORMAL HIGH (ref 70–99)
Glucose-Capillary: 321 mg/dL — ABNORMAL HIGH (ref 70–99)

## 2017-12-25 LAB — BASIC METABOLIC PANEL
Anion gap: 8 (ref 5–15)
BUN: 8 mg/dL (ref 8–23)
CO2: 24 mmol/L (ref 22–32)
CREATININE: 1.13 mg/dL — AB (ref 0.44–1.00)
Calcium: 9.3 mg/dL (ref 8.9–10.3)
Chloride: 109 mmol/L (ref 98–111)
GFR calc Af Amer: 50 mL/min — ABNORMAL LOW (ref >60)
GFR calc non Af Amer: 43 mL/min — ABNORMAL LOW (ref >60)
GLUCOSE: 136 mg/dL — AB (ref 70–99)
Potassium: 3.8 mmol/L (ref 3.5–5.1)
Sodium: 141 mmol/L (ref 135–145)

## 2017-12-25 MED ORDER — METOPROLOL TARTRATE 5 MG/5ML IV SOLN
5.0000 mg | Freq: Three times a day (TID) | INTRAVENOUS | Status: DC
  Administered 2017-12-25 – 2017-12-27 (×6): 5 mg via INTRAVENOUS
  Filled 2017-12-25 (×6): qty 5

## 2017-12-25 NOTE — Progress Notes (Signed)
PMT consult received and chart reviewed. VM left for daughter, [name] to arrange [place] meeting. Thank you.   NO CHARGE  Ihor Dow, FNP-C Palliative Medicine Team  Phone: [phone] Fax: [phone]

## 2017-12-25 NOTE — Progress Notes (Signed)
PMT provider to meet with daughter, [name], 10/30 at 3pm. Thank you.   NO CHARGE  Ihor Dow, FNP-C Palliative Medicine Team  Phone: [phone] Fax: [phone]

## 2017-12-25 NOTE — Procedures (Signed)
History: 84 year old female being evaluated for seizure disorder and encephalopathy  Sedation: None  Technique: This is a 21 channel routine scalp EEG performed at the bedside with bipolar and monopolar montages arranged in accordance to the international 10/20 system of electrode placement. One channel was dedicated to EKG recording.    Background: There is prominent generalized irregular theta and delta activities.  In addition there are moderately frequent generalized discharges with a bifrontal predominance, anterior posterior lag, and triphasic morphology.  These attenuate markedly during sleep.  There is a posterior dominant rhythm of 6 to 7 Hz at times.  Photic stimulation: Physiologic driving is not performed  EEG Abnormalities: 1) triphasic waves 2) generalized irregular slow activity 3) slow PDR  Clinical Interpretation: This EEG is consistent with a generalized nonspecific cerebral dysfunction (encephalopathy).  Though nonspecific, triphasic waves can be suggestive of toxic-metabolic etiologies.   There was no seizure or seizure predisposition recorded on this study. Please note that a normal EEG does not preclude the possibility of epilepsy.   Abigail Rack, MD [facility] [phone]  If 7pm- 7am, please page neurology on call as listed in [place].

## 2017-12-25 NOTE — Consult Note (Signed)
   [facility] CM Inpatient Consult   12/25/2017  KENECIA BARREN 05/06/1929 [id number]   Chart reviewed for high risk for unplanned readmission score and multiple admissions in the past 6 months.  Chart reviewed and per inpatient social worker.  Patient is a resident of [place].  Noted patient also has a palliative consult. No current post hospital [place] Management needs assessed with patient with [employer]  For questions, contact:  Natividad Brood, RN BSN [facility] Liaison  [phone] business mobile phone Toll free office [phone]

## 2017-12-25 NOTE — Progress Notes (Addendum)
PROGRESS NOTE    Abigail Boyd  VOH:[id number] DOB: 07-10-1929 DOA: 12/21/2017 PCP: Leone Haven, MD    Brief Narrative:  84 year old lady with medical history significant for but not limited to previous CVA in 2016 with respiratory fibrillation on Xarelto, pulse sleep apnea hypertension presenting with right facial droop, aphasia associated with mild jerking movements. She was admitted for evaluation of stroke and seizures. Neurology consulted.    Assessment & Plan:   Principal Problem:   Seizure disorder ([place]) Active Problems:   Essential hypertension   Long term current use of anticoagulant therapy   Obstructive sleep apnea   CKD stage 2 due to type 2 diabetes mellitus (HCC)   GERD (gastroesophageal reflux disease)   Diabetes mellitus type 2 with neurological manifestations (HCC)  Seizures:  None since last night, pt is alert but not responding to verbal cues and not at all following commands.  EEG does not show epileptiform activity in awake and drowsy state.  Currently on keppra IV.  Currently no signs of infection with negative CXR and UA.  Neurology consulted and recommendations given.    Acute encephalopathy:  Unclear etiology, she does not appear to be post ictal. . Could be metabolic encephalopathy from dehydration and seizures. Discussed in detail with daughter and called palliative care consult for goals of care.  She is alert but not following commands. SLP eval , Recommended NPO status as she is not following commands.    Hypertension:  Sub optimal.  Will resume home meds and do prn hydralazine.    Diabetes Mellitus: CBG (last 3)  Recent Labs    12/24/17 2111 12/25/17 0630 12/25/17 1138  GLUCAP 102* 132* 181*   Increase to moderate SSI.  Resume home meds.    Mild normocytic anemia; Hemoglobin stable around 11.    Stage 2 CKD: CREATININE at baseline.   Paroxysmal atrial fibrillation:  Rate controlled.  Resume metoprolol for rate  control and xarelto for anti coagulation.    Hypokalemia: replaced.    GERD; on protonix.   DVT prophylaxis: scd's Code Status:DNR Family Communication: none at bedside.  Disposition Plan: pending clinical improvement and palliative care consult.   Consultants:   Neurology.   Procedures:  EEG  Antimicrobials: NONE  Subjective: Alert but not following commands , encephalopathic.   Objective: Vitals:   12/24/17 2036 12/25/17 0004 12/25/17 0342 12/25/17 0903  BP: 138/61 (!) 140/54 (!) 152/78 (!) 155/97  Pulse: (!) 104 73 95 (!) 104  Resp: 20 20 19 16   Temp: 98.6 F (37 C) 98.3 F (36.8 C) 98 F (36.7 C) 98.7 F (37.1 C)  TempSrc: Oral Oral Oral Axillary  SpO2: 97% 96% 100% 98%  Weight:      Height:        Intake/Output Summary (Last 24 hours) at 12/25/2017 1328 Last data filed at 12/25/2017 1200 Gross per 24 hour  Intake 2369.01 ml  Output 1200 ml  Net 1169.01 ml   Filed Weights   12/21/17 1814 12/22/17 0111  Weight: 78.7 kg 75.2 kg    Examination:  General exam: Appears calm and comfortable not in any kind of distress.  Respiratory system: Clear to auscultation. Respiratory effort normal. No wheezing or rhonchi.  Cardiovascular system: S1 & S2 heard, RRR.Marland Kitchen No pedal edema. Gastrointestinal system: Abdomen is soft non tender non distended bowel sounds good.  Central nervous system: Alert not following commands.  Extremities: trace pedal edema, no cyanosis or clubbing.  Skin: No rashes, lesions or ulcers  Psychiatry: cannot be assessed.     Data Reviewed: I have personally reviewed following labs and imaging studies  CBC: Recent Labs  Lab 12/21/17 1746 12/21/17 1747 12/22/17 0344 12/24/17 1905  WBC 10.4  --  10.1 13.6*  NEUTROABS 6.9  --   --   --   HGB 11.5* 11.9* 11.4* 12.3  HCT 36.8 35.0* 36.2 37.6  MCV 100.3*  --  98.9 95.9  PLT 317  --  309 726   Basic Metabolic Panel: Recent Labs  Lab 12/21/17 1746 12/21/17 1747 12/22/17 0344  12/23/17 0511 12/24/17 1905 12/25/17 0332  NA 140 140 140 141 140 141  K 3.9 3.9 4.1 3.8 3.3* 3.8  CL 100 99 105 106 105 109  CO2 27  --  26 24 23 24   GLUCOSE 87 89 65* 167* 101* 136*  BUN 16 19 16 11  7* 8  CREATININE 1.22* 1.30* 1.12* 1.02* 0.85 1.13*  CALCIUM 9.7  --  9.4 9.5 9.6 9.3   GFR: Estimated Creatinine Clearance: 36.3 mL/min (A) (by C-G formula based on SCr of 1.13 mg/dL (H)). Liver Function Tests: Recent Labs  Lab 12/21/17 1746 12/22/17 0344  AST 28 27  ALT 29 28  ALKPHOS 114 96  BILITOT 0.2* 0.5  PROT 6.6 6.4*  ALBUMIN 3.1* 3.2*   No results for input(s): LIPASE, AMYLASE in the last 168 hours. No results for input(s): AMMONIA in the last 168 hours. Coagulation Profile: Recent Labs  Lab 12/21/17 1746  INR 1.22   Cardiac Enzymes: No results for input(s): CKTOTAL, CKMB, CKMBINDEX, TROPONINI in the last 168 hours. BNP (last 3 results) No results for input(s): PROBNP in the last 8760 hours. HbA1C: No results for input(s): HGBA1C in the last 72 hours. CBG: Recent Labs  Lab 12/24/17 1443 12/24/17 1627 12/24/17 2111 12/25/17 0630 12/25/17 1138  GLUCAP 114* 83 102* 132* 181*   Lipid Profile: No results for input(s): CHOL, HDL, LDLCALC, TRIG, CHOLHDL, LDLDIRECT in the last 72 hours. Thyroid Function Tests: No results for input(s): TSH, T4TOTAL, FREET4, T3FREE, THYROIDAB in the last 72 hours. Anemia Panel: No results for input(s): VITAMINB12, FOLATE, FERRITIN, TIBC, IRON, RETICCTPCT in the last 72 hours. Sepsis Labs: No results for input(s): PROCALCITON, LATICACIDVEN in the last 168 hours.  No results found for this or any previous visit (from the past 240 hour(s)).       Radiology Studies: No results found.      Scheduled Meds: . atorvastatin  80 mg Oral Daily  . azelastine  2 spray Each Nare QHS  . docusate sodium  100 mg Oral Daily  . furosemide  20 mg Oral Daily  . gabapentin  300 mg Oral TID  . insulin aspart  0-15 Units  Subcutaneous TID WC  . insulin aspart  0-5 Units Subcutaneous QHS  . insulin glargine  75 Units Subcutaneous QHS  . latanoprost  1 drop Both Eyes QHS  . lidocaine  1 patch Transdermal Q24H  . loratadine  10 mg Oral Daily  . magnesium oxide  400 mg Oral QHS  . metoprolol tartrate  25 mg Oral BID  . MUSCLE RUB  1 application Topical TID  . omega-3 acid ethyl esters  1 g Oral Daily  . oxybutynin  5 mg Oral BID  . pantoprazole  40 mg Oral Daily  . PARoxetine  20 mg Oral Daily  . Rivaroxaban  15 mg Oral QPM  . rOPINIRole  0.5 mg Oral QHS  . timolol  1  drop Both Eyes QHS  . traMADol  50 mg Oral TID   Continuous Infusions: . dextrose 5 % and 0.9% NaCl 75 mL/hr at 12/23/17 2200  . levETIRAcetam 500 mg (12/25/17 1100)  . valproate sodium 250 mg (12/24/17 2235)     LOS: 4 days    Time spent: 25 minutes    Hosie Poisson, MD [name] Pager [phone]   If 7PM-7AM, please contact night-coverage [email or web address] Password [email or web address] 12/25/2017, 1:28 PM

## 2017-12-25 NOTE — Progress Notes (Signed)
EEG completed; results pending.    

## 2017-12-25 NOTE — Progress Notes (Signed)
PT is fully alert; and RN has tried several of times to administer PO medications. Pt is refusing anything PO; PT creates a tight seal with her mouth and pushes RN hands away. RN has tried to educate pt on the importance of her medications and pt continues to push away. RN will notify provider.

## 2017-12-25 NOTE — Progress Notes (Signed)
SLP Cancellation Note  Patient Details Name: Abigail Boyd MRN: [id number] DOB: January 06, 1930   Cancelled treatment:       Reason Eval/Treat Not Completed: Other (comment)(RN phoned SLP and inquired regarding pt having po medications, SLP advised it only if pt is fully alert, RN states pt is not adequately awake for po, recommend consider palliative referral given pt's lack of ability to consume po and npo since admit)   Luanna Salk, [facility] SLP [place] Pager [phone] Office [phone]  Macario Golds 12/25/2017, 9:47 AM

## 2017-12-26 DIAGNOSIS — R4182 Altered mental status, unspecified: Secondary | ICD-10-CM

## 2017-12-26 DIAGNOSIS — R1310 Dysphagia, unspecified: Secondary | ICD-10-CM

## 2017-12-26 DIAGNOSIS — Z7189 Other specified counseling: Secondary | ICD-10-CM

## 2017-12-26 DIAGNOSIS — Z515 Encounter for palliative care: Secondary | ICD-10-CM

## 2017-12-26 LAB — GLUCOSE, CAPILLARY
Glucose-Capillary: 248 mg/dL — ABNORMAL HIGH (ref 70–99)
Glucose-Capillary: 181 mg/dL — ABNORMAL HIGH (ref 70–99)
Glucose-Capillary: 233 mg/dL — ABNORMAL HIGH (ref 70–99)
Glucose-Capillary: 211 mg/dL — ABNORMAL HIGH (ref 70–99)

## 2017-12-26 LAB — BASIC METABOLIC PANEL
ANION GAP: 9 (ref 5–15)
BUN: 14 mg/dL (ref 8–23)
CALCIUM: 9.5 mg/dL (ref 8.9–10.3)
CHLORIDE: 109 mmol/L (ref 98–111)
CO2: 24 mmol/L (ref 22–32)
Creatinine, Ser: 0.93 mg/dL (ref 0.44–1.00)
GFR calc non Af Amer: 54 mL/min — ABNORMAL LOW (ref >60)
Glucose, Bld: 194 mg/dL — ABNORMAL HIGH (ref 70–99)
POTASSIUM: 3.7 mmol/L (ref 3.5–5.1)
Sodium: 142 mmol/L (ref 135–145)

## 2017-12-26 LAB — CBC
HCT: 38.5 % (ref 36.0–46.0)
HEMOGLOBIN: 12.4 g/dL (ref 12.0–15.0)
MCH: 31.6 pg (ref 26.0–34.0)
MCHC: 32.2 g/dL (ref 30.0–36.0)
MCV: 98 fL (ref 80.0–100.0)
NRBC: 0 % (ref 0.0–0.2)
Platelets: 342 10*3/uL (ref 150–400)
RBC: 3.93 MIL/uL (ref 3.87–5.11)
RDW: 12.7 % (ref 11.5–15.5)
WBC: 12 10*3/uL — AB (ref 4.0–10.5)

## 2017-12-26 MED ORDER — DOCUSATE SODIUM 50 MG/5ML PO LIQD
100.0000 mg | Freq: Every day | ORAL | Status: DC
  Administered 2017-12-26 – 2017-12-27 (×2): 100 mg via ORAL
  Filled 2017-12-26 (×2): qty 10

## 2017-12-26 MED ORDER — MIRTAZAPINE 15 MG PO TBDP
7.5000 mg | ORAL_TABLET | Freq: Every day | ORAL | Status: DC
  Administered 2017-12-26: 7.5 mg via ORAL
  Filled 2017-12-26: qty 0.5

## 2017-12-26 NOTE — Progress Notes (Addendum)
PROGRESS NOTE    Abigail Boyd  JOA:[id number] DOB: December 04, 1929 DOA: 12/21/2017 PCP: Leone Haven, MD    Brief Narrative:  84 year old lady with medical history significant for but not limited to previous CVA in 2016 with respiratory fibrillation on Xarelto, pulse sleep apnea hypertension presenting with right facial droop, aphasia associated with mild jerking movements. She was admitted for evaluation of stroke and seizures. Neurology consulted.    Assessment & Plan:   Principal Problem:   Seizure disorder ([place]) Active Problems:   Essential hypertension   Long term current use of anticoagulant therapy   Obstructive sleep apnea   CKD stage 2 due to type 2 diabetes mellitus (HCC)   GERD (gastroesophageal reflux disease)   Diabetes mellitus type 2 with neurological manifestations (HCC)  Seizures:  None since last night, pt is alert but not responding to questions and not at all following commands. No change from yesterday.  EEG does not show epileptiform activity in awake and drowsy state. Repeated EEG which shows This EEG is consistent with a generalized nonspecific cerebral dysfunction (encephalopathy).  Though nonspecific, triphasic waves can be suggestive of toxic-metabolic etiologies.  started on keppra , and no seizure activity or seizure like activity. But unable to change to oral keppra as pt is refusing to take any meds or take food by mouth when given by the staff.   Currently no signs of infection with negative CXR and UA.  Neurology consulted and recommendations given.    Acute encephalopathy:  Unclear etiology, she does not appear to be post ictal. . Could be metabolic encephalopathy from dehydration and seizures. Discussed in detail with daughter and called palliative care consult for goals of care.  She is alert but not following commands. As per RN, she ate ice cream and drank ensure when given by the family and is refusing to take meds and take food when given by  staff.  SLP eval , Recommended NPO status as she is not following commands.    Hypertension:  Sub optimal.  Will resume home meds and do prn hydralazine.    Diabetes Mellitus: CBG (last 3)  Recent Labs    12/25/17 1639 12/25/17 2120 12/26/17 0618  GLUCAP 140* 321* 248*   Increase to moderate SSI.  Resume home meds.    Mild normocytic anemia; Hemoglobin stable around 11.    Stage 2 CKD: CREATININE at baseline.   Paroxysmal atrial fibrillation:  Rate controlled.  Resume metoprolol for rate control and xarelto for anti coagulation.    Hypokalemia: replaced.    GERD; on protonix.   DVT prophylaxis: scd's Code Status:DNR Family Communication: none at bedside.  Discussed with daughter over the phone on 10/29 Disposition Plan: pending palliative care consult.   Consultants:   Neurology.   Palliative care   Procedures:  EEG MRI brain.   Antimicrobials: NONE  Subjective: Alert but not following commands ,  No distress noted.   Objective: Vitals:   12/25/17 1950 12/26/17 0010 12/26/17 0412 12/26/17 0826  BP: (!) 142/71 134/70 (!) 143/84 (!) 156/74  Pulse: (!) 122 89 93 88  Resp: 20 20 20 20   Temp: 99.8 F (37.7 C) 99.5 F (37.5 C) 98.8 F (37.1 C) 98.6 F (37 C)  TempSrc: Axillary Axillary Oral Axillary  SpO2: 98% 98% 100% 98%  Weight:      Height:        Intake/Output Summary (Last 24 hours) at 12/26/2017 1106 Last data filed at 12/26/2017 0500 Gross per 24  hour  Intake 1797.37 ml  Output 1900 ml  Net -102.63 ml   Filed Weights   12/21/17 1814 12/22/17 0111  Weight: 78.7 kg 75.2 kg    Examination:  General exam: Appears calm  , not in distress.  Respiratory system: clear to auscultation, no wheezing or rhonchi.  Cardiovascular system: S1 & S2 heard, RRR., no JVD. trace pedal edema. Gastrointestinal system: Abdomen is soft NT ND BS+ Central nervous system: Alert not following commands.  Extremities: trace pedal edema, no cyanosis  or clubbing.  Skin: No rashes, lesions or ulcers Psychiatry: cannot be assessed.     Data Reviewed: I have personally reviewed following labs and imaging studies  CBC: Recent Labs  Lab 12/21/17 1746 12/21/17 1747 12/22/17 0344 12/24/17 1905  WBC 10.4  --  10.1 13.6*  NEUTROABS 6.9  --   --   --   HGB 11.5* 11.9* 11.4* 12.3  HCT 36.8 35.0* 36.2 37.6  MCV 100.3*  --  98.9 95.9  PLT 317  --  309 557   Basic Metabolic Panel: Recent Labs  Lab 12/21/17 1746 12/21/17 1747 12/22/17 0344 12/23/17 0511 12/24/17 1905 12/25/17 0332  NA 140 140 140 141 140 141  K 3.9 3.9 4.1 3.8 3.3* 3.8  CL 100 99 105 106 105 109  CO2 27  --  26 24 23 24   GLUCOSE 87 89 65* 167* 101* 136*  BUN 16 19 16 11  7* 8  CREATININE 1.22* 1.30* 1.12* 1.02* 0.85 1.13*  CALCIUM 9.7  --  9.4 9.5 9.6 9.3   GFR: Estimated Creatinine Clearance: 36.3 mL/min (A) (by C-G formula based on SCr of 1.13 mg/dL (H)). Liver Function Tests: Recent Labs  Lab 12/21/17 1746 12/22/17 0344  AST 28 27  ALT 29 28  ALKPHOS 114 96  BILITOT 0.2* 0.5  PROT 6.6 6.4*  ALBUMIN 3.1* 3.2*   No results for input(s): LIPASE, AMYLASE in the last 168 hours. No results for input(s): AMMONIA in the last 168 hours. Coagulation Profile: Recent Labs  Lab 12/21/17 1746  INR 1.22   Cardiac Enzymes: No results for input(s): CKTOTAL, CKMB, CKMBINDEX, TROPONINI in the last 168 hours. BNP (last 3 results) No results for input(s): PROBNP in the last 8760 hours. HbA1C: No results for input(s): HGBA1C in the last 72 hours. CBG: Recent Labs  Lab 12/25/17 0630 12/25/17 1138 12/25/17 1639 12/25/17 2120 12/26/17 0618  GLUCAP 132* 181* 140* 321* 248*   Lipid Profile: No results for input(s): CHOL, HDL, LDLCALC, TRIG, CHOLHDL, LDLDIRECT in the last 72 hours. Thyroid Function Tests: No results for input(s): TSH, T4TOTAL, FREET4, T3FREE, THYROIDAB in the last 72 hours. Anemia Panel: No results for input(s): VITAMINB12, FOLATE,  FERRITIN, TIBC, IRON, RETICCTPCT in the last 72 hours. Sepsis Labs: No results for input(s): PROCALCITON, LATICACIDVEN in the last 168 hours.  No results found for this or any previous visit (from the past 240 hour(s)).       Radiology Studies: No results found.      Scheduled Meds: . atorvastatin  80 mg Oral Daily  . azelastine  2 spray Each Nare QHS  . docusate sodium  100 mg Oral Daily  . furosemide  20 mg Oral Daily  . gabapentin  300 mg Oral TID  . insulin aspart  0-15 Units Subcutaneous TID WC  . insulin aspart  0-5 Units Subcutaneous QHS  . insulin glargine  75 Units Subcutaneous QHS  . latanoprost  1 drop Both Eyes QHS  .  lidocaine  1 patch Transdermal Q24H  . loratadine  10 mg Oral Daily  . magnesium oxide  400 mg Oral QHS  . metoprolol tartrate  5 mg Intravenous Q8H  . MUSCLE RUB  1 application Topical TID  . omega-3 acid ethyl esters  1 g Oral Daily  . oxybutynin  5 mg Oral BID  . pantoprazole  40 mg Oral Daily  . PARoxetine  20 mg Oral Daily  . Rivaroxaban  15 mg Oral QPM  . rOPINIRole  0.5 mg Oral QHS  . timolol  1 drop Both Eyes QHS  . traMADol  50 mg Oral TID   Continuous Infusions: . dextrose 5 % and 0.9% NaCl 75 mL/hr at 12/26/17 0904  . levETIRAcetam 500 mg (12/25/17 2114)  . valproate sodium 250 mg (12/25/17 2224)     LOS: 5 days    Time spent: 25 minutes    Hosie Poisson, MD [name] Pager [phone]   If 7PM-7AM, please contact night-coverage [email or web address] Password [email or web address] 12/26/2017, 11:06 AM

## 2017-12-26 NOTE — Progress Notes (Signed)
Pt's family at the bedside. Daughter attempted to feed pt and pt cooperated. PT  was able to eat a cup of Magic cup ice cream and more than half of an Ensure. Pt also took Xarelto with ice cream when RN attempted.

## 2017-12-26 NOTE — Consult Note (Signed)
Consultation Note Date: 12/26/2017   Patient Name: Abigail Boyd  DOB: [birth year]  MRN: [id number]  Age / Sex: 84 y.o., female  PCP: Leone Haven, MD Referring Physician: Hosie Poisson, MD  Reason for Consultation: Establishing goals of care  HPI/Patient Profile: 84 y.o. female  with past medical history of CVA, paroxsymal afib on xarelto, HTN, DM, CKD II, HLD admitted on 12/21/2017 with right facial droop, aphasia and mild jerking movements. Admitted for evaluation of stroke and seizures. Neurology following. CT head/MRI negative for acute findings. CT does reveal cerebral atrophy and small vessel ischemic changes. EEG x2 negative for epileptiform activity. Receiving keppra. Patient is alert but not responding to questions, not following commands, and with poor nutritional status. No signs of infection with negative CXR and UA. Palliative medicine consultation for goals of care.   Clinical Assessment and Goals of Care:  I have reviewed medical records, discussed with Dr. Karleen Hampshire, and met with patient's daughter [name]), sister [name]), and granddaughter in conference room to discuss diagnosis, prognosis, GOC, EOL wishes, disposition and options.  Introduced Palliative Medicine as specialized medical care for people living with serious illness. It focuses on providing relief from the symptoms and stress of a serious illness. The goal is to improve quality of life for both the patient and the family.  We discussed a brief life review of the patient. Widowed. [name] is an only child. Patient was independent and living alone up until stroke in 2016, when she moved in with her daughter. The patient suffered another stroke in May 2019 and has since been at [facility]. She is a long-term care resident since she did not progress with therapy. Baseline, she is wheelchair bound and requires lift to assist  with moving from bed to chair. Since second stroke, she has ongoing dysarthria. Prior to this admission, patient was able to feed self regular diet. She was also participating in SNF activities including [facility].   Discussed events leading up to hospitalization and course of hospitalization including diagnoses and interventions. Discussed concern with poor nutritional status leading to increased weakness, dehydration, and further health decline. [name] and [name] share that they were able to get her to eat ice cream and ensure last night. She has accepted food and pills for nursing staff today (previously declining food and refusing to take medications). Also discussed possible ongoing cognitive decline after stroke and possible dementia (age related changes on CT scan).  Advanced directives, concepts specific to code status, and artifical feeding and hydration were discussed. Discussed at length short-term vs. Long-term feeding tubes and risks associated. Educated on medical recommendation against feeding tube in elderly patients with underlying co-morbidities. Discussed quality of life vs. Quantity. Explained that if nutritional status does not improve, family is faced with big decisions in regards to pursuing feeding tube or shifting to comfort measures. Discussed comfort feeds.   The difference between aggressive medical intervention and comfort care was considered. Daughter does confirm her mother's wishes for DNR code status. Introduced and discussed MOST  form.   At this point, family is hopeful to continue encouragement with feeding. Explained that she will likely need continued dysphagia diet at nursing facility. Family speaks of Aarvi responding well to staff at [facility]. Family has told Rendi she can't leave the hospital until she starts eating, and believe this helped encourage her to eat last night. Strongly encouraged family to be present during meals, since she responds well to them.   Daughter does  ask what happens if they did not pursue feeding tube and nutritional status remained poor. Again explained comfort feeds and high protein/high calorie snacks (ensure/magic cup). Explained recommendation for hospice with shift to comfort.   Questions and concerns were addressed.  Hard Choices booklet left for review. PMT contact information given. Family appreciative of conversation. Therapeutic listening as they share stories and pictures of [place].    SUMMARY OF RECOMMENDATIONS    Good initial palliative discussion with patient's daughter, sister, and granddaughter.   Daughter confirms DNR code status in the event of cardiac arrest. Introduced MOST form and encouraged her to further consider wishes regarding life support/feeding tube. Educated on medical recommendation against feeding tube with underlying co-morbidities, risks in patients with cognitive decline, and this not enhancing quality of life with underlying co-morbidities. Discussed risks with feeding tubes.  Patient has shown slight improvement today by accepting food and medications. Family hopeful for continued improvement in nutritional status with encouragement. They understand they are faced with big decisions regarding feeding tube vs. Comfort measures if nutritional status does not improve.   Hard Choices copy provided.   Patient may benefit from outpatient palliative referral at SNF. If nutritional status does not improve and family opts against feeding tube, patient would be eligible for hospice services at SNF.   Code Status/Advance Care Planning:  DNR  Symptom Management:   Remeron 7.'5mg'$  PO HS for appetite stimulation  Palliative Prophylaxis:   Aspiration, Delirium Protocol, Oral Care and Turn Reposition  Psycho-social/Spiritual:   Desire for further Chaplaincy support:yes  Additional Recommendations: Caregiving  Support/Resources and Education on Hospice  Prognosis:   Unable to determine  Discharge  Planning: To Be Determined      Primary Diagnoses: Present on Admission: . Obstructive sleep apnea . CKD stage 2 due to type 2 diabetes mellitus ([place]) . GERD (gastroesophageal reflux disease) . Diabetes mellitus type 2 with neurological manifestations ([place]) . Essential hypertension   I have reviewed the medical record, interviewed the patient and family, and examined the patient. The following aspects are pertinent.  Past Medical History:  Diagnosis Date  . Abnormal nuclear stress test 06/21/2011   Inferolateral reversible defect;--> cardiac cath & PCI of CxOM, occluded RCA with collaterals;; followup Myoview 11/2012: Low risk. Fixed basal inferior artifact normal EF. No ischemia  . Anxiety   . Asthma   . Bilateral cataracts    Status post stroke or correction  . CAD S/P percutaneous coronary angioplasty 06/2011   s/P PCI to proximal OM1 w/ DES; occluded RCA with bridging and L-R collaterals (medical management)  . Chronic kidney disease (CKD), stage II (mild)    Related to current bladder infections (although diabetes cannot be excluded)  . Chronotropic incompetence with sinus node dysfunction [facility]) October 2013   On CPET test; beta blockers reduced  . Diabetes mellitus type 2, controlled ([place])    On oral medications  . Diverticulitis   . Essential hypertension    Allowing for permissive hypertension to avoid orthostatic hypotension  . GERD (gastroesophageal reflux disease)  On PPI  . Glaucoma   . History of syncope    Per EP - neurocardiogenic & not Bradycardia related (no PPM)  . History of unstable angina 06/13/2011   Jaw pain awakening from sleep -- Myoview --CATH --> PCI  . Hyperlipidemia with target LDL less than 70    HDL at goal, LDL not at goal, borderline triglycerides. On Crestor 20 mg  . Migraines   . OSA (obstructive sleep apnea)    hx bladder infections  . Osteoarthritis   . PAF (paroxysmal atrial fibrillation) ([place]) 10-11/ 2014   CardioNet Event  Monitor: NSR & S Brady -- Rates 50-100; Total A. fib burden 35 hours and 27 minutes. 1296 episodes, longest was 1 hour 29 minutes. Rate ranged from 52-169 beats per minute.  . Seasonal allergies   . Shortness of breath on exertion October 2013   2-D echo: Normal EF>55%, Gr 1DD, mild aortic sclerosis; Evaluated with CPET - peak VO2 97%; Chronotropic Incompetence (submaximal effort)  . Spinal stenosis of lumbar region 11/2011  . Stroke ([place]) 06/2017  . Stroke ([place]) 11/2014  . Tachycardia-bradycardia syndrome ([place]) 11/2012  . Urge incontinence    Social History   Socioeconomic History  . Marital status: Widowed    Spouse name: Not on file  . Number of children: Not on file  . Years of education: Not on file  . Highest education level: Not on file  Occupational History  . Occupation: retired  Scientific laboratory technician  . Financial resource strain: Not hard at all  . Food insecurity:    Worry: Patient refused    Inability: Patient refused  . Transportation needs:    Medical: Patient refused    Non-medical: Patient refused  Tobacco Use  . Smoking status: Never Smoker  . Smokeless tobacco: Never Used  Substance and Sexual Activity  . Alcohol use: No  . Drug use: No  . Sexual activity: Never    Birth control/protection: Post-menopausal  Lifestyle  . Physical activity:    Days per week: Patient refused    Minutes per session: Patient refused  . Stress: Only a little  Relationships  . Social connections:    Talks on phone: Patient refused    Gets together: Patient refused    Attends religious service: Patient refused    Active member of club or organization: Patient refused    Attends meetings of clubs or organizations: Patient refused    Relationship status: Patient refused  Other Topics Concern  . Not on file  Social History Narrative   Widowed mother of one, grandmother 2. Has not been using silver take as much lately due to knee discomfort. Usually exercises with Silver Sneakers on  regular basis.   Family History  Problem Relation Age of Onset  . COPD Mother   . Pulmonary fibrosis Mother   . Heart attack Father   . Heart disease Father   . Stroke Father   . Cancer Sister   . Cancer Sister   . Clotting disorder Sister   . Heart attack Sister   . Arthritis Unknown   . Breast cancer Sister   . Colon cancer Neg Hx   . Esophageal cancer Neg Hx   . Stomach cancer Neg Hx    Scheduled Meds: . atorvastatin  80 mg Oral Daily  . azelastine  2 spray Each Nare QHS  . docusate  100 mg Oral Daily  . furosemide  20 mg Oral Daily  . gabapentin  300 mg Oral  TID  . insulin aspart  0-15 Units Subcutaneous TID WC  . insulin aspart  0-5 Units Subcutaneous QHS  . insulin glargine  75 Units Subcutaneous QHS  . latanoprost  1 drop Both Eyes QHS  . lidocaine  1 patch Transdermal Q24H  . loratadine  10 mg Oral Daily  . magnesium oxide  400 mg Oral QHS  . metoprolol tartrate  5 mg Intravenous Q8H  . mirtazapine  7.5 mg Oral QHS  . MUSCLE RUB  1 application Topical TID  . omega-3 acid ethyl esters  1 g Oral Daily  . oxybutynin  5 mg Oral BID  . pantoprazole  40 mg Oral Daily  . PARoxetine  20 mg Oral Daily  . Rivaroxaban  15 mg Oral QPM  . rOPINIRole  0.5 mg Oral QHS  . timolol  1 drop Both Eyes QHS  . traMADol  50 mg Oral TID   Continuous Infusions: . dextrose 5 % and 0.9% NaCl 75 mL/hr at 12/26/17 0904  . levETIRAcetam 500 mg (12/26/17 1129)  . valproate sodium 250 mg (12/25/17 2224)   PRN Meds:.acetaminophen, hydrALAZINE, nitroGLYCERIN, ondansetron **OR** ondansetron (ZOFRAN) IV, polyethylene glycol, traMADol Medications Prior to Admission:  Prior to Admission medications   Medication Sig Start Date End Date Taking? Authorizing Provider  acetaminophen (TYLENOL) 650 MG CR tablet Take 650 mg by mouth every 6 (six) hours as needed for pain.   Yes [provider]  atorvastatin (LIPITOR) 80 MG tablet Take 1 tablet (80 mg total) by mouth daily at 6 PM. Patient  taking differently: Take 80 mg by mouth daily.  07/25/17  Yes Gherghe, Vella Redhead, MD  azelastine (ASTELIN) 0.1 % nasal spray Place 2 sprays into both nostrils at bedtime. Use in each nostril as directed    Yes [provider]  divalproex (DEPAKOTE) 125 MG DR tablet Take 250 mg by mouth at bedtime.   Yes [provider]  docusate sodium (STOOL SOFTENER) 100 MG capsule Take 100 mg by mouth daily.    Yes [provider]  fish oil-omega-3 fatty acids 1000 MG capsule Take 1 g by mouth daily.    Yes [provider]  furosemide (LASIX) 20 MG tablet Take 20 mg by mouth daily.   Yes [provider]  gabapentin (NEURONTIN) 300 MG capsule Take 300 mg by mouth 3 (three) times daily.    Yes [provider]  glucagon (GLUCAGON EMERGENCY) 1 MG injection Inject 1 mg into the vein once as needed. Give 1 mg IM for Hypoglycemia (symptomatic) not responding to oral glucose, snack. May repeat x 1 after 15 minutes. USE ONLY IF PT IS OR IS BECOMING UNRESPONSIVE   Yes [provider]  HUMALOG KWIKPEN 100 UNIT/ML KiwkPen Inject 22 Units into the skin 3 (three) times daily with meals. DM2. Hold for CBG < 200 05/28/17  Yes [provider]  [facility] EX) Apply liberal amount topically to area of skin irritation prn. OK to leave at bedside.   Yes [provider]  Insulin Glargine (TOUJEO MAX SOLOSTAR) 300 UNIT/ML SOPN Inject 75 Units into the skin at bedtime. Alternate abdominal injection sites.   Yes [provider]  latanoprost (XALATAN) 0.005 % ophthalmic solution Place 1 drop into both eyes at bedtime.   Yes [provider]  levocetirizine (XYZAL) 5 MG tablet Take 2.5 mg by mouth daily.    Yes [provider]  Lidocaine-Menthol (ICY HOT LIDOCAINE PLUS MENTHOL) 4-1 % PTCH Apply  1 patch topically daily. apply to lower back in the a.m. for pain management   Yes [provider]  Magnesium 500 MG  CAPS Take 500 mg by mouth at bedtime.    Yes [provider]  metFORMIN (GLUCOPHAGE-XR) 500 MG 24 hr tablet Take 500 mg by mouth daily with breakfast.    Yes [provider]  metoprolol tartrate (LOPRESSOR) 25 MG tablet Take 1 tablet (25 mg total) by mouth 2 (two) times daily. 07/08/17  Yes Epifanio Lesches, MD  nitroGLYCERIN (NITROSTAT) 0.4 MG SL tablet Place 0.4 mg under the tongue every 5 (five) minutes as needed for chest pain.   Yes [provider]  oxybutynin (DITROPAN) 5 MG tablet Take 5 mg by mouth 2 (two) times daily.   Yes [provider]  pantoprazole (PROTONIX) 40 MG tablet Take 1 tablet (40 mg total) by mouth daily. 04/05/17  Yes Leone Haven, MD  PARoxetine (PAXIL) 20 MG tablet Take 1 tablet (20 mg total) by mouth daily. 07/25/17 07/25/18 Yes Gherghe, Vella Redhead, MD  polyethylene glycol (MIRALAX / GLYCOLAX) packet Take 17 g by mouth daily as needed for moderate constipation. 04/25/17  Yes Gouru, Illene Silver, MD  Rivaroxaban (XARELTO) 15 MG TABS tablet Take 15 mg by mouth every evening.    Yes [provider]  rOPINIRole (REQUIP) 0.5 MG tablet TAKE 1 TABLET BY MOUTH AT  BEDTIME 05/30/17  Yes Leone Haven, MD  timolol (TIMOPTIC) 0.5 % ophthalmic solution Place 1 drop into both eyes at bedtime.    Yes [provider]  traMADol (ULTRAM) 50 MG tablet Take 1 tablet (50 mg total) by mouth 3 (three) times daily. At 6 am 12 pm and 10 pm 11/14/17  Yes Gerlene Fee, NP  traMADol (ULTRAM) 50 MG tablet Take 1 tablet (50 mg total) by mouth daily as needed. Patient taking differently: Take 50 mg by mouth daily as needed for moderate pain.  11/14/17  Yes Gerlene Fee, NP  trolamine salicylate (ASPERCREME) 10 % cream Apply 1 application topically 3 (three) times daily. Apply to right knee   Yes [provider]   Allergies  Allergen Reactions  . Amlodipine Cough  . Clopidogrel Bisulfate Cough  . Kenalog [Triamcinolone Acetonide]  Other (See Comments)    unknown  . Lisinopril Cough  . Losartan Cough  . Sulfa Antibiotics Hives and Rash   Review of Systems  Unable to perform ROS: Mental status change   Physical Exam  Constitutional: She is sleeping.  Pulmonary/Chest: No accessory muscle usage. No tachypnea. No respiratory distress.  Neurological:  Wakes to voice, pleasantly confused. Dysarthria   Skin: Skin is warm and dry.  Psychiatric: Cognition and memory are impaired. She is noncommunicative. She is inattentive.  Nursing note and vitals reviewed.   Vital Signs: BP 140/73 (BP Location: Right Arm)   Pulse 93   Temp 99 F (37.2 C) (Axillary)   Resp 18   Ht _0  (1.651 m)   Wt 75.2 kg   SpO2 98%   BMI 27.59 kg/m  Pain Scale: 0-10   Pain Score: Asleep   SpO2: SpO2: 98 % O2 Device:SpO2: 98 % O2 Flow Rate: .   IO: Intake/output summary:   Intake/Output Summary (Last 24 hours) at 12/26/2017 2005 Last data filed at 12/26/2017 1900 Gross per 24 hour  Intake 1545.49 ml  Output 601 ml  Net 944.49 ml    LBM: Last BM Date: (pt unable to report) Baseline Weight: Weight: 78.7  kg Most recent weight: Weight: 75.2 kg     Palliative Assessment/Data: PPS 30%   Flowsheet Rows     Most Recent Value  Intake Tab  Referral Department  Hospitalist  Unit at Time of Referral  Med/Surg Unit  Palliative Care Primary Diagnosis  Other (Comment)  Palliative Care Type  New Palliative care  Date first seen by Palliative Care  12/26/17  Clinical Assessment  Palliative Performance Scale Score  30%  Psychosocial & Spiritual Assessment  Palliative Care Outcomes  Patient/Family meeting held?  Yes  Who was at the meeting?  daughter, sister, granddaughter  Palliative Care Outcomes  Clarified goals of care, Provided end of life care assistance, Provided psychosocial or spiritual support, ACP counseling assistance, Counseled regarding hospice, Linked to palliative care logitudinal support      Time In:  1510 Time Out: 1630 Time Total: 59mn Greater than 50%  of this time was spent counseling and coordinating care related to the above assessment and plan.  Signed by:  MIhor Dow FNP-C Palliative Medicine Team  Phone: [phone]: [phone]  Please contact Palliative Medicine Team phone at [phone] questions and concerns.  For individual provider: See AShea Evans

## 2017-12-26 NOTE — Progress Notes (Signed)
  Speech Language Pathology Treatment: Dysphagia  Patient Details Name: Abigail Boyd MRN: [id number] DOB: 1929/06/02 Today's Date: 12/26/2017 Time: 7262-0355 SLP Time Calculation (min) (ACUTE ONLY): 36 min  Assessment / Plan / Recommendation Clinical Impression  Pt today is accepting po intake from SLP  Also helps hand over hand to hold Ensure bottle, graham cracker, etc.  Consuming Ensure via straw tolerated well and adequate mastication of solids noted.  Pt does become lethargic during session and thus rec only feed when fully alert and upright.  No family present to address but precautions reviewed with pt and RN.     HPI HPI: 84 y.o.femaleWith PMH stroke (2016, May 2019), PAF, OSA, HLD, HTN, DM2, CKD 2 who presented to [facility] for right facial droop and aphasia. CT/MRI negative for acute process; concern for partial seizure. EEG results pending. Pt does have a history of dysphagia, had MBS 10/01/17 showing mild oropharyngeal dysphagia characterized by mild disorganized oral management and delayed pharyngeal swallow initiation; advancement to regular diet with thin liquids was recommended.       SLP Plan  Continue with current plan of care       Recommendations  Diet recommendations: Dysphagia 3 (mechanical soft);Thin liquid(ground meats) Liquids provided via: Cup;Straw Medication Administration: Crushed with puree Supervision: Staff to assist with self feeding(pt to help self feed, hand over hand, uses left hand) Compensations: Slow rate;Small sips/bites(assure pt swallows, delayed swallow noted) Postural Changes and/or Swallow Maneuvers: Seated upright 90 degrees;Upright 30-60 min after meal                Oral Care Recommendations: Oral care QID Follow up Recommendations: Other (comment)(tbd) SLP Visit Diagnosis: Dysphagia, oral phase (R13.11) Plan: Continue with current plan of care       [place], [place] [facility] SLP Acute Rehab Services Pager  [phone] Office [phone]  Macario Golds 12/26/2017, 11:52 AM

## 2017-12-26 NOTE — Discharge Instructions (Signed)

## 2017-12-27 DIAGNOSIS — M5441 Lumbago with sciatica, right side: Secondary | ICD-10-CM | POA: Diagnosis not present

## 2017-12-27 DIAGNOSIS — B370 Candidal stomatitis: Secondary | ICD-10-CM | POA: Diagnosis not present

## 2017-12-27 DIAGNOSIS — I5032 Chronic diastolic (congestive) heart failure: Secondary | ICD-10-CM | POA: Diagnosis not present

## 2017-12-27 DIAGNOSIS — E1169 Type 2 diabetes mellitus with other specified complication: Secondary | ICD-10-CM | POA: Diagnosis not present

## 2017-12-27 DIAGNOSIS — N182 Chronic kidney disease, stage 2 (mild): Secondary | ICD-10-CM | POA: Diagnosis not present

## 2017-12-27 DIAGNOSIS — R1312 Dysphagia, oropharyngeal phase: Secondary | ICD-10-CM | POA: Diagnosis not present

## 2017-12-27 DIAGNOSIS — M6281 Muscle weakness (generalized): Secondary | ICD-10-CM | POA: Diagnosis not present

## 2017-12-27 DIAGNOSIS — E1149 Type 2 diabetes mellitus with other diabetic neurological complication: Secondary | ICD-10-CM | POA: Diagnosis not present

## 2017-12-27 DIAGNOSIS — G4761 Periodic limb movement disorder: Secondary | ICD-10-CM | POA: Diagnosis not present

## 2017-12-27 DIAGNOSIS — I4891 Unspecified atrial fibrillation: Secondary | ICD-10-CM | POA: Diagnosis not present

## 2017-12-27 DIAGNOSIS — I480 Paroxysmal atrial fibrillation: Secondary | ICD-10-CM | POA: Diagnosis not present

## 2017-12-27 DIAGNOSIS — Z7901 Long term (current) use of anticoagulants: Secondary | ICD-10-CM | POA: Diagnosis not present

## 2017-12-27 DIAGNOSIS — G40909 Epilepsy, unspecified, not intractable, without status epilepticus: Secondary | ICD-10-CM | POA: Diagnosis not present

## 2017-12-27 DIAGNOSIS — Z7401 Bed confinement status: Secondary | ICD-10-CM | POA: Diagnosis not present

## 2017-12-27 DIAGNOSIS — I69351 Hemiplegia and hemiparesis following cerebral infarction affecting right dominant side: Secondary | ICD-10-CM | POA: Diagnosis not present

## 2017-12-27 DIAGNOSIS — I69320 Aphasia following cerebral infarction: Secondary | ICD-10-CM | POA: Diagnosis not present

## 2017-12-27 DIAGNOSIS — G9340 Encephalopathy, unspecified: Secondary | ICD-10-CM | POA: Diagnosis not present

## 2017-12-27 DIAGNOSIS — N3281 Overactive bladder: Secondary | ICD-10-CM | POA: Diagnosis not present

## 2017-12-27 DIAGNOSIS — M48061 Spinal stenosis, lumbar region without neurogenic claudication: Secondary | ICD-10-CM | POA: Diagnosis not present

## 2017-12-27 DIAGNOSIS — I130 Hypertensive heart and chronic kidney disease with heart failure and stage 1 through stage 4 chronic kidney disease, or unspecified chronic kidney disease: Secondary | ICD-10-CM | POA: Diagnosis not present

## 2017-12-27 DIAGNOSIS — Z515 Encounter for palliative care: Secondary | ICD-10-CM | POA: Diagnosis not present

## 2017-12-27 DIAGNOSIS — H409 Unspecified glaucoma: Secondary | ICD-10-CM | POA: Diagnosis not present

## 2017-12-27 DIAGNOSIS — R4182 Altered mental status, unspecified: Secondary | ICD-10-CM | POA: Diagnosis not present

## 2017-12-27 DIAGNOSIS — R0902 Hypoxemia: Secondary | ICD-10-CM | POA: Diagnosis not present

## 2017-12-27 DIAGNOSIS — I639 Cerebral infarction, unspecified: Secondary | ICD-10-CM | POA: Diagnosis not present

## 2017-12-27 DIAGNOSIS — R400 Somnolence: Secondary | ICD-10-CM | POA: Diagnosis not present

## 2017-12-27 DIAGNOSIS — G4733 Obstructive sleep apnea (adult) (pediatric): Secondary | ICD-10-CM | POA: Diagnosis not present

## 2017-12-27 DIAGNOSIS — Z794 Long term (current) use of insulin: Secondary | ICD-10-CM | POA: Diagnosis not present

## 2017-12-27 DIAGNOSIS — E785 Hyperlipidemia, unspecified: Secondary | ICD-10-CM | POA: Diagnosis not present

## 2017-12-27 DIAGNOSIS — M2550 Pain in unspecified joint: Secondary | ICD-10-CM | POA: Diagnosis not present

## 2017-12-27 DIAGNOSIS — N183 Chronic kidney disease, stage 3 (moderate): Secondary | ICD-10-CM | POA: Diagnosis not present

## 2017-12-27 DIAGNOSIS — Z9861 Coronary angioplasty status: Secondary | ICD-10-CM | POA: Diagnosis not present

## 2017-12-27 DIAGNOSIS — K219 Gastro-esophageal reflux disease without esophagitis: Secondary | ICD-10-CM | POA: Diagnosis not present

## 2017-12-27 DIAGNOSIS — E1122 Type 2 diabetes mellitus with diabetic chronic kidney disease: Secondary | ICD-10-CM | POA: Diagnosis not present

## 2017-12-27 DIAGNOSIS — I2510 Atherosclerotic heart disease of native coronary artery without angina pectoris: Secondary | ICD-10-CM | POA: Diagnosis not present

## 2017-12-27 DIAGNOSIS — I25119 Atherosclerotic heart disease of native coronary artery with unspecified angina pectoris: Secondary | ICD-10-CM | POA: Diagnosis not present

## 2017-12-27 DIAGNOSIS — R279 Unspecified lack of coordination: Secondary | ICD-10-CM | POA: Diagnosis not present

## 2017-12-27 DIAGNOSIS — J302 Other seasonal allergic rhinitis: Secondary | ICD-10-CM | POA: Diagnosis not present

## 2017-12-27 LAB — GLUCOSE, CAPILLARY
GLUCOSE-CAPILLARY: 130 mg/dL — AB (ref 70–99)
Glucose-Capillary: 112 mg/dL — ABNORMAL HIGH (ref 70–99)

## 2017-12-27 MED ORDER — LEVETIRACETAM 500 MG PO TABS: 500.0000 mg | ORAL_TABLET | Freq: Two times a day (BID) | ORAL | 0 refills | Status: DC

## 2017-12-27 MED ORDER — MIRTAZAPINE 15 MG PO TBDP: 7.5000 mg | ORAL_TABLET | Freq: Every day | ORAL | 0 refills | Status: DC

## 2017-12-27 MED ORDER — LEVETIRACETAM 500 MG PO TABS: 500.0000 mg | ORAL_TABLET | Freq: Two times a day (BID) | ORAL | Status: DC

## 2017-12-27 MED ORDER — TRAMADOL HCL 50 MG PO TABS: 50.0000 mg | ORAL_TABLET | Freq: Three times a day (TID) | ORAL | 0 refills | Status: DC

## 2017-12-27 NOTE — Progress Notes (Signed)
  Speech Language Pathology Treatment: Dysphagia  Patient Details Name: Abigail Boyd MRN: [id number] DOB: [birth year] Today's Date: 12/27/2017 Time: 6837-2902 SLP Time Calculation (min) (ACUTE ONLY): 14 min  Assessment / Plan / Recommendation Clinical Impression  Pt seen to assess po tolerance and adequacy of po intake.  Pt sleepy today but willing to help hold cup/bottle and form suction on straw and elicit swallow with orange juice and Ensure. She also masticated biscuit and peach with significant delay but adequacy of airway.  Pt remains sleepy - and intake documented Is limited.  Suspect pt's mentation will prevent her from receiving adequate nutrition.  Note documentation from palliative meeting.   If family decides to pursue PEG for pt would recommend she be allowed to continue po intake for enjoyment, comfort, and to maintain swallow function.  SLP to sign off at this time as care plan regarding swallowing is in place.     HPI HPI: 84 y.o.femaleWith PMH stroke (2016, May 2019), PAF, OSA, HLD, HTN, DM2, CKD 2 who presented to [facility] for right facial droop and aphasia. CT/MRI negative for acute process; concern for partial seizure. EEG results pending. Pt does have a history of dysphagia, had MBS 10/01/17 showing mild oropharyngeal dysphagia characterized by mild disorganized oral management and delayed pharyngeal swallow initiation; advancement to regular diet with thin liquids was recommended.       SLP Plan  All goals met       Recommendations  Diet recommendations: Dysphagia 3 (mechanical soft);Thin liquid Liquids provided via: Cup;Straw Medication Administration: Crushed with puree Supervision: Staff to assist with self feeding(pt to help self feed, hand over hand, uses left hand) Compensations: Slow rate;Small sips/bites;Lingual sweep for clearance of pocketing(assure pt swallows, delayed swallow noted) Postural Changes and/or Swallow Maneuvers: Seated upright 90 degrees;Upright  30-60 min after meal                Oral Care Recommendations: Oral care QID Follow up Recommendations: Other (comment)(tbd) SLP Visit Diagnosis: Dysphagia, oral phase (R13.11) Plan: All goals met       GO              Luanna Salk, MS [name] SLP Acute Rehab Services Pager [phone] Office [phone]   Macario Golds 12/27/2017, 9:38 AM

## 2017-12-27 NOTE — Progress Notes (Signed)
Physical Therapy Treatment Patient Details Name: Abigail Boyd MRN: [id number] DOB: 12-02-1929 Today's Date: 12/27/2017    History of Present Illness Patient is an 84 y/o female presenting to the ED on 12/21/17 with R facila droop and aphasia. Head CT without contrast is negative. 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 Continued work-up. PMH significant for CVA in 2016, obstructive sleep apnea, paroxysmal atrial fibrillation on chronic anticoagulation, hypertension, diabetes, chronic kidney disease stage II and hyperlipidemia.    PT Comments    Patient's tolerance to treatment today was fairly good.  Patient was non-verbal during the treatment with noted grimacing during bed mobility prior to rolling from supine to sidelying.  Pt was able to follow verbal commands inconsistently.  She attempted to reach toward bed rail with left arm when prompted by therapist; however, she was unable to assist with rolling to sidelying.  Patient required total assist +2 to transition from sidelying to sitting EOB.  Patient demonstrated posterior and right lateral lean when sitting EOB.  Pt was able to participate in reaching activities on EOB requiring repeated verbal cues and increased time for processing and initiation.  Patient would continue to benefit from acute care PT in order to increase her strength, balance, and activity tolerance prior to recommended SNF destination.   Follow Up Recommendations  SNF;Supervision/Assistance - 24 hour     Equipment Recommendations  Other (comment)    Recommendations for Other Services OT consult     Precautions / Restrictions Precautions Precautions: Fall Restrictions Weight Bearing Restrictions: No    Mobility  Bed Mobility Overal bed mobility: Needs Assistance Bed Mobility: Rolling Rolling: Total assist;+2 for physical assistance         General bed mobility comments: total A +2 for bed level  mobility; poor initiation of movement; pt was able to reach with L arm towards bed railing but unable to assist with rolling to sidelying.  Pt required total assist+2 to advance LEs to EOB and to elevate trunk into sitting.  Pt required total assist +2 and use of bed pad to scoot forward to EOB.  Transfers                    Ambulation/Gait                 Stairs             Wheelchair Mobility    Modified Rankin (Stroke Patients Only) Modified Rankin (Stroke Patients Only) Pre-Morbid Rankin Score: Moderately severe disability Modified Rankin: Severe disability     Balance Overall balance assessment: Needs assistance Sitting-balance support: Feet supported;Bilateral upper extremity supported Sitting balance-Leahy Scale: Poor Sitting balance - Comments: repeated verbal cuing and min assist +2 to maintain sitting balance on EOB Postural control: Posterior lean;Right lateral lean                                  Cognition Arousal/Alertness: Awake/alert Behavior During Therapy: Flat affect Overall Cognitive Status: Difficult to assess Area of Impairment: Following commands;Attention;Awareness;Orientation;Safety/judgement;Problem solving                   Current Attention Level: Sustained   Following Commands: Follows one step commands inconsistently;Follows one step commands with increased time Safety/Judgement: Decreased awareness of safety Awareness: Emergent Problem Solving: Slow processing;Decreased initiation;Difficulty sequencing;Requires verbal cues General Comments: pt able to follow 1 step  commands inconsistently      Exercises Other Exercises Other Exercises: L UE reaching across midline sitting on EOB x5    General Comments        Pertinent Vitals/Pain Pain Assessment: Faces Faces Pain Scale: Hurts a little bit Pain Location: generalized Pain Descriptors / Indicators: Grimacing Pain Intervention(s): Monitored  during session;Limited activity within patient's tolerance    Home Living                      Prior Function            PT Goals (current goals can now be found in the care plan section) Acute Rehab PT Goals Patient Stated Goal: none stated PT Goal Formulation: Patient unable to participate in goal setting Time For Goal Achievement: 01/07/18 Potential to Achieve Goals: Fair    Frequency    Min 2X/week      PT Plan      Co-evaluation              AM-PAC PT "6 Clicks" Daily Activity  Outcome Measure  Difficulty turning over in bed (including adjusting bedclothes, sheets and blankets)?: Unable Difficulty moving from lying on back to sitting on the side of the bed? : Unable Difficulty sitting down on and standing up from a chair with arms (e.g., wheelchair, bedside commode, etc,.)?: Unable Help needed moving to and from a bed to chair (including a wheelchair)?: Total Help needed walking in hospital room?: Total Help needed climbing 3-5 steps with a railing? : Total 6 Click Score: 6    End of Session   Activity Tolerance: Patient tolerated treatment well Patient left: in bed;with call bell/phone within reach;with bed alarm set Nurse Communication: Mobility status PT Visit Diagnosis: Unsteadiness on feet (R26.81);Other abnormalities of gait and mobility (R26.89);Muscle weakness (generalized) (M62.81)     Time: 3005-1102 PT Time Calculation (min) (ACUTE ONLY): 32 min  Charges:  $Therapeutic Activity: 23-37 mins                     [address], SPTA  Roslyn Else 12/27/2017, 4:18 PM

## 2017-12-27 NOTE — Progress Notes (Signed)
Pt discharge to [facility] and report called off to nurse Molly at the facility. Pt IV and telemetry removed and all education and care plan completed. Pt transported off unit via stretcher with belongings to the side. Pt picked up by [facility] to be transported off unit to disposition. Delia Heady RN

## 2017-12-27 NOTE — Progress Notes (Signed)
Initial Nutrition Assessment  DOCUMENTATION CODES:   Not applicable  INTERVENTION:  Provide Ensure Enlive po TID, each supplement provides 350 kcal and 20 grams of protein.  Provide 30 ml Prostat po BID, each supplement provides 100 kcal and 15 grams of protein.   Encourage adequate PO intake.   NUTRITION DIAGNOSIS:   Inadequate oral intake related to dysphagia as evidenced by meal completion < 25%.  GOAL:   Patient will meet greater than or equal to 90% of their needs  MONITOR:   PO intake, Supplement acceptance, Labs, Weight trends, I & O's, Skin  REASON FOR ASSESSMENT:   Consult Assessment of nutrition requirement/status, Poor PO  ASSESSMENT:   84 y.o. female  with past medical history of CVA, paroxsymal afib on xarelto, HTN, DM, CKD II, HLD admitted on 12/21/2017 with right facial droop, aphasia and mild jerking movements. Admitted for evaluation of stroke and seizures.  CT head/MRI negative for acute findings. CT does reveal cerebral atrophy and small vessel ischemic changes  No family at bedside during time of visit. Pt unable to respond to questions during assessment. RD unable to obtain pt nutrition history PTA. Nurse tech at bedside reports pt consumed ~25% of breakfast and lunch today. Family and nursing staff have been encouraging po intake. Opened Ensure bottle at bedside with 10% intake. Pt with a 10% weight loss in 5 months per weight records, which is significant for time frame. RD to order nutritional supplements to aid in caloric and protein needs. Labs and medications reviewed.  Pt with no observed significant fat or muscle mass loss via nutrition focused physical exam.   Diet Order:   Diet Order            Diet - low sodium heart healthy        DIET DYS 3 Room service appropriate? Yes; Fluid consistency: Thin  Diet effective now              EDUCATION NEEDS:   Not appropriate for education at this time  Skin:  Skin Assessment: Reviewed RN  Assessment  Last BM:  Unknown  Height:   Ht Readings from Last 1 Encounters:  12/22/17 5\' 5"  (1.651 m)    Weight:   Wt Readings from Last 1 Encounters:  12/22/17 75.2 kg    Ideal Body Weight:  56.8 kg  BMI:  Body mass index is 27.59 kg/m.  Estimated Nutritional Needs:   Kcal:  1650-1850  Protein:  75-85 grams  Fluid:  1.6-1.8 L/day    Corrin Parker, MS, RD, LDN Pager # [phone] After hours/ weekend pager # [phone]

## 2017-12-27 NOTE — Discharge Summary (Signed)
Physician Discharge Summary  Abigail Boyd UMP:[id number] DOB: 05/07/1929 DOA: 12/21/2017  PCP: Leone Haven, MD  Admit date: 12/21/2017 Discharge date: 12/27/2017  Admitted From: SNF Disposition:  SNF  Recommendations for Outpatient Follow-up:  1. Follow up with PCP in 1-2 weeks 2. Please obtain BMP/CBC in one week Please follow up with neurology in 4 weeks.  Please follow up with Palliative care at the facility   Discharge Condition:stable.  CODE STATUS:DNR Diet recommendation: Dysphagia 3 (mechanical soft);Thin liquid Liquids provided via: Cup;Straw Medication Administration: Crushed with puree Supervision: Staff to assist with self feeding(pt to help self feed, hand over hand, uses left hand) Compensations: Slow rate;Small sips/bites;Lingual sweep for clearance of pocketing(assure pt swallows, delayed swallow noted) Postural Changes and/or Swallow Maneuvers: Seated upright 90 degrees;Upright 30-60 min after meal  Brief/Interim Summary:  84 year old lady with medical history significant for but not limited to previous CVA in 2016 with respiratory fibrillation on Xarelto, pulse sleep apnea hypertension presenting with right facial droop, aphasia associated with mild jerking movements. She was admitted for evaluation of stroke and seizures. Neurology consulted.    Discharge Diagnoses:  Principal Problem:   Seizure disorder [facility]) Active Problems:   Essential hypertension   Long term current use of anticoagulant therapy   Obstructive sleep apnea   Goals of care, counseling/discussion   CKD stage 2 due to type 2 diabetes mellitus (HCC)   GERD (gastroesophageal reflux disease)   Encephalopathy   Diabetes mellitus type 2 with neurological manifestations ([place])   Palliative care by specialist   Altered mental status   Dysphagia  Seizures:  Resolved.  EEG does not show epileptiform activity in awake and drowsy state. Repeated EEG which shows ThisEEG is consistent with a  generalized nonspecific cerebral dysfunction (encephalopathy).Though nonspecific, triphasic waves can be suggestive of toxic-metabolic etiologies.  started on keppra , and no seizure activity or seizure like activity.  Currently no signs of infection with negative CXR and UA.  Neurology consulted and recommendations given.  She will be discharged on keppra 500 mg BID.     Acute encephalopathy:  Unclear etiology, she does not appear to be post ictal. . Could be metabolic encephalopathy from dehydration and seizures. Appears to have improved and she is back to baseline.  Discussed in detail with daughter and called palliative care consult for goals of care. Palliative care team discussed with family and would like them to follow at the facility. Since she started eating and following some commands, discussed with family and she is being discharged to the facility. SLP evaluation recommended Dysphagia 3 (mechanical soft);Thin liquid Liquids provided via: Cup;Straw Medication Administration: Crushed with puree Supervision: Staff to assist with self feeding(pt to help self feed, hand over hand, uses left hand) Compensations: Slow rate;Small sips/bites;Lingual sweep for clearance of pocketing(assure pt swallows, delayed swallow noted) Postural Changes and/or Swallow Maneuvers: Seated upright 90 degrees;Upright 30-60 min after meal  Hypertension:  Better controlled.    Diabetes Mellitus: CBG (last 3)  Recent Labs    12/26/17 1626 12/26/17 2110 12/27/17 0632  GLUCAP 233* 211* 112*    Resume home meds.    Mild normocytic anemia; Hemoglobin stable around 11.    Stage 2 CKD: CREATININE at baseline.   Paroxysmal atrial fibrillation:  Rate controlled.  Resume metoprolol for rate control and xarelto for anti coagulation.    Hypokalemia: replaced.    GERD; on protonix.    Discharge Instructions  Discharge Instructions    Diet - low sodium heart healthy  Complete by:  As directed    Discharge instructions   Complete by:  As directed    Follow up with PCP in one week.     Allergies as of 12/27/2017      Reactions   Amlodipine Cough   Clopidogrel Bisulfate Cough   Kenalog [triamcinolone Acetonide] Other (See Comments)   unknown   Lisinopril Cough   Losartan Cough   Sulfa Antibiotics Hives, Rash      Medication List    TAKE these medications   acetaminophen 650 MG CR tablet Commonly known as:  TYLENOL Take 650 mg by mouth every 6 (six) hours as needed for pain.   atorvastatin 80 MG tablet Commonly known as:  LIPITOR Take 1 tablet (80 mg total) by mouth daily at 6 PM. What changed:  when to take this   azelastine 0.1 % nasal spray Commonly known as:  ASTELIN Place 2 sprays into both nostrils at bedtime. Use in each nostril as directed   DERMACLOUD EX Apply liberal amount topically to area of skin irritation prn. OK to leave at bedside.   divalproex 125 MG DR tablet Commonly known as:  DEPAKOTE Take 250 mg by mouth at bedtime.   fish oil-omega-3 fatty acids 1000 MG capsule Take 1 g by mouth daily.   furosemide 20 MG tablet Commonly known as:  LASIX Take 20 mg by mouth daily.   gabapentin 300 MG capsule Commonly known as:  NEURONTIN Take 300 mg by mouth 3 (three) times daily.   GLUCAGON EMERGENCY 1 MG injection Generic drug:  glucagon Inject 1 mg into the vein once as needed. Give 1 mg IM for Hypoglycemia (symptomatic) not responding to oral glucose, snack. May repeat x 1 after 15 minutes. USE ONLY IF PT IS OR IS BECOMING UNRESPONSIVE   HUMALOG KWIKPEN 100 UNIT/ML KiwkPen Generic drug:  insulin lispro Inject 22 Units into the skin 3 (three) times daily with meals. DM2. Hold for CBG < 200   ICY HOT LIDOCAINE PLUS MENTHOL 4-1 % Ptch Generic drug:  Lidocaine-Menthol Apply 1 patch topically daily. apply to lower back in the a.m. for pain management   latanoprost 0.005 % ophthalmic solution Commonly known as:   XALATAN Place 1 drop into both eyes at bedtime.   levETIRAcetam 500 MG tablet Commonly known as:  KEPPRA Take 1 tablet (500 mg total) by mouth 2 (two) times daily.   levocetirizine 5 MG tablet Commonly known as:  XYZAL Take 2.5 mg by mouth daily.   Magnesium 500 MG Caps Take 500 mg by mouth at bedtime.   metFORMIN 500 MG 24 hr tablet Commonly known as:  GLUCOPHAGE-XR Take 500 mg by mouth daily with breakfast.   metoprolol tartrate 25 MG tablet Commonly known as:  LOPRESSOR Take 1 tablet (25 mg total) by mouth 2 (two) times daily.   mirtazapine 15 MG disintegrating tablet Commonly known as:  REMERON SOL-TAB Take 0.5 tablets (7.5 mg total) by mouth at bedtime.   nitroGLYCERIN 0.4 MG SL tablet Commonly known as:  NITROSTAT Place 0.4 mg under the tongue every 5 (five) minutes as needed for chest pain.   oxybutynin 5 MG tablet Commonly known as:  DITROPAN Take 5 mg by mouth 2 (two) times daily.   pantoprazole 40 MG tablet Commonly known as:  PROTONIX Take 1 tablet (40 mg total) by mouth daily.   PARoxetine 20 MG tablet Commonly known as:  PAXIL Take 1 tablet (20 mg total) by mouth daily.   polyethylene glycol  packet Commonly known as:  MIRALAX / GLYCOLAX Take 17 g by mouth daily as needed for moderate constipation.   Rivaroxaban 15 MG Tabs tablet Commonly known as:  XARELTO Take 15 mg by mouth every evening.   rOPINIRole 0.5 MG tablet Commonly known as:  REQUIP TAKE 1 TABLET BY MOUTH AT  BEDTIME   STOOL SOFTENER 100 MG capsule Generic drug:  docusate sodium Take 100 mg by mouth daily.   timolol 0.5 % ophthalmic solution Commonly known as:  TIMOPTIC Place 1 drop into both eyes at bedtime.   TOUJEO MAX SOLOSTAR 300 UNIT/ML Sopn Generic drug:  Insulin Glargine Inject 75 Units into the skin at bedtime. Alternate abdominal injection sites.   traMADol 50 MG tablet Commonly known as:  ULTRAM Take 1 tablet (50 mg total) by mouth 3 (three) times daily. At 6 am 12  pm and 10 pm What changed:  Another medication with the same name was removed. Continue taking this medication, and follow the directions you see here.   trolamine salicylate 10 % cream Commonly known as:  ASPERCREME Apply 1 application topically 3 (three) times daily. Apply to right knee      Contact information for after-discharge care    Destination    [facility] .   Service:  Skilled Nursing Contact information: [place] [phone]             Allergies  Allergen Reactions  . Amlodipine Cough  . Clopidogrel Bisulfate Cough  . Kenalog [Triamcinolone Acetonide] Other (See Comments)    unknown  . Lisinopril Cough  . Losartan Cough  . Sulfa Antibiotics Hives and Rash    Consultations:  Palliative care  Neurology.    Procedures/Studies: Mr Brain Wo Contrast  Result Date: 12/21/2017 CLINICAL DATA:  RIGHT facial droop, RIGHT arm drift and aphasia, last seen normal at 15 30 hours today. History of stroke, hypertension, hyperlipidemia and atrial fibrillation. EXAM: MRI HEAD WITHOUT CONTRAST TECHNIQUE: Multiplanar, multiecho pulse sequences of the brain and surrounding structures were obtained without intravenous contrast. COMPARISON:  CT HEAD December 21, 2017 and MRI head Jul 22, 2017 FINDINGS: Motion degraded examination, some sequences are at least moderately motion degraded. INTRACRANIAL CONTENTS: No reduced diffusion to suggest acute ischemia. No susceptibility artifact to suggest hemorrhage. Moderate to severe parenchymal brain volume loss. Ex vacuo dilatation lateral ventricles due to multiple basal ganglia and thalami infarcts. Prominent basal ganglia perivascular spaces associated with chronic small vessel ischemic changes. Patchy to confluent supratentorial white matter T2 hyperintensities. Old small LEFT cerebellar infarct. No hydrocephalus. Mild RIGHT cerebral peduncle wallerian degeneration. No  suspicious parenchymal signal, masses, mass effect. No abnormal extra-axial fluid collections. No extra-axial masses. VASCULAR: Normal major intracranial vascular flow voids present at skull base. SKULL AND UPPER CERVICAL SPINE: No abnormal sellar expansion. No suspicious calvarial bone marrow signal. Craniocervical junction maintained. SINUSES/ORBITS: Expansile mucocele LEFT sphenoid with obstructive RIGHT sinusitis.The included ocular globes and orbital contents are non-suspicious. Status post bilateral ocular lens implants. OTHER: None. IMPRESSION: 1. Motion degraded examination.  No acute intracranial process. 2. Multiple old small supra- and infratentorial infarcts. 3. Moderate to severe parenchymal brain volume loss. Electronically Signed   By: Elon Alas M.D.   On: 12/21/2017 22:03   Dg Chest Port 1 View  Result Date: 12/22/2017 CLINICAL DATA:  In cephalopathy. EXAM: PORTABLE CHEST 1 VIEW COMPARISON:  04/23/2017 FINDINGS: Cardiac silhouette is normal in size. No mediastinal or hilar masses. Lungs are clear. Eventrated  right hemidiaphragm is stable from the prior exam. No pleural effusion or pneumothorax. Skeletal structures are grossly intact. IMPRESSION: No active disease. Electronically Signed   By: Lajean Manes M.D.   On: 12/22/2017 17:16   Ct Head Code Stroke Wo Contrast  Result Date: 12/21/2017 CLINICAL DATA:  Code stroke. EXAM: CT HEAD WITHOUT CONTRAST TECHNIQUE: Contiguous axial images were obtained from the base of the skull through the vertex without intravenous contrast. COMPARISON:  07/22/2017 MRI head.  07/21/2017 CT head and CTA head. FINDINGS: Brain: No evidence of acute infarction, hemorrhage, hydrocephalus, extra-axial collection or mass lesion/mass effect. Small chronic infarcts within bilateral thalami and the right caudate head are stable. Encephalomalacia involving the left lentiform nucleus, caudate body, and corona radiata corresponding to prior infarction. Stable  chronic microvascular ischemic changes and volume loss of the brain. Vascular: Calcific atherosclerosis of carotid siphons. No hyperdense vessel identified. Skull: Normal. Negative for fracture or focal lesion. Sinuses/Orbits: Opacification of the sphenoid sinus with fluid level. Normal aeration of mastoid air cells. Bilateral intra-ocular lens replacement. Other: None. ASPECTS [facility] Stroke Program Early CT Score) - Ganglionic level infarction (caudate, lentiform nuclei, internal capsule, insula, M1-M3 cortex): 7 - Supraganglionic infarction (M4-M6 cortex): 3 Total score (0-10 with 10 being normal): 10 IMPRESSION: 1. No acute intracranial abnormality identified. 2. ASPECTS is 10. 3. Stable chronic microvascular ischemic changes, volume loss, and small chronic infarcts of the basal ganglia. 4. Chronic infarct of the left lentiform nucleus, caudate body, and corona radiata corresponding to the prior acute infarction on 07/22/2017 MRI of the brain. 5. Sphenoid sinus opacification with fluid level, progressed from prior study, compatible with acute sinusitis in the appropriate clinical setting. These results were communicated to Dr. Lorraine Lax at 6:04 pmon 10/25/2019by text page via the [facility] messaging system. Electronically Signed   By: Kristine Garbe M.D.   On: 12/21/2017 18:07       Subjective: No chest pain or sob.  Alert and comfortable.  No seizure activity in the last 24 hours.  Started to eat and ate about 30% of the breakfast.  Discharge Exam: Vitals:   12/27/17 0426 12/27/17 0739  BP: (!) 148/64 (!) 159/65  Pulse: 75 78  Resp: 20 20  Temp: 97.9 F (36.6 C) 97.7 F (36.5 C)  SpO2: 96% 98%   Vitals:   12/26/17 1944 12/27/17 0005 12/27/17 0426 12/27/17 0739  BP: 140/73 139/77 (!) 148/64 (!) 159/65  Pulse: 93 91 75 78  Resp: 18 18 20 20   Temp: 99 F (37.2 C) 99.3 F (37.4 C) 97.9 F (36.6 C) 97.7 F (36.5 C)  TempSrc: Axillary Axillary Oral Axillary  SpO2: 98% 98% 96% 98%   Weight:      Height:        General: Pt is alert, awake, not in acute distress Cardiovascular: RRR, S1/S2 +, no rubs, no gallops Respiratory: CTA bilaterally, no wheezing, no rhonchi Abdominal: Soft, NT, ND, bowel sounds + Extremities: no edema, no cyanosis    The results of significant diagnostics from this hospitalization (including imaging, microbiology, ancillary and laboratory) are listed below for reference.     Microbiology: No results found for this or any previous visit (from the past 240 hour(s)).   Labs: BNP (last 3 results) No results for input(s): BNP in the last 8760 hours. Basic Metabolic Panel: Recent Labs  Lab 12/22/17 0344 12/23/17 0511 12/24/17 1905 12/25/17 0332 12/26/17 1141  NA 140 141 140 141 142  K 4.1 3.8 3.3* 3.8 3.7  CL  105 106 105 109 109  CO2 26 24 23 24 24   GLUCOSE 65* 167* 101* 136* 194*  BUN 16 11 7* 8 14  CREATININE 1.12* 1.02* 0.85 1.13* 0.93  CALCIUM 9.4 9.5 9.6 9.3 9.5   Liver Function Tests: Recent Labs  Lab 12/21/17 1746 12/22/17 0344  AST 28 27  ALT 29 28  ALKPHOS 114 96  BILITOT 0.2* 0.5  PROT 6.6 6.4*  ALBUMIN 3.1* 3.2*   No results for input(s): LIPASE, AMYLASE in the last 168 hours. No results for input(s): AMMONIA in the last 168 hours. CBC: Recent Labs  Lab 12/21/17 1746 12/21/17 1747 12/22/17 0344 12/24/17 1905 12/26/17 1141  WBC 10.4  --  10.1 13.6* 12.0*  NEUTROABS 6.9  --   --   --   --   HGB 11.5* 11.9* 11.4* 12.3 12.4  HCT 36.8 35.0* 36.2 37.6 38.5  MCV 100.3*  --  98.9 95.9 98.0  PLT 317  --  309 371 342   Cardiac Enzymes: No results for input(s): CKTOTAL, CKMB, CKMBINDEX, TROPONINI in the last 168 hours. BNP: Invalid input(s): POCBNP CBG: Recent Labs  Lab 12/26/17 0618 12/26/17 1108 12/26/17 1626 12/26/17 2110 12/27/17 0632  GLUCAP 248* 181* 233* 211* 112*   D-Dimer No results for input(s): DDIMER in the last 72 hours. Hgb A1c No results for input(s): HGBA1C in the last 72  hours. Lipid Profile No results for input(s): CHOL, HDL, LDLCALC, TRIG, CHOLHDL, LDLDIRECT in the last 72 hours. Thyroid function studies No results for input(s): TSH, T4TOTAL, T3FREE, THYROIDAB in the last 72 hours.  Invalid input(s): FREET3 Anemia work up No results for input(s): VITAMINB12, FOLATE, FERRITIN, TIBC, IRON, RETICCTPCT in the last 72 hours. Urinalysis    Component Value Date/Time   COLORURINE YELLOW 12/22/2017 1530   APPEARANCEUR CLOUDY (A) 12/22/2017 1530   APPEARANCEUR Cloudy (A) 06/04/2017 1507   LABSPEC 1.011 12/22/2017 1530   PHURINE 9.0 (H) 12/22/2017 1530   GLUCOSEU NEGATIVE 12/22/2017 1530   GLUCOSEU NEGATIVE 08/19/2015 1630   HGBUR NEGATIVE 12/22/2017 [place] 12/22/2017 1530   BILIRUBINUR Negative 06/04/2017 [place] 12/22/2017 1530   PROTEINUR NEGATIVE 12/22/2017 1530   UROBILINOGEN 0.2 04/12/2017 1355   UROBILINOGEN 0.2 08/19/2015 1630   NITRITE NEGATIVE 12/22/2017 1530   LEUKOCYTESUR NEGATIVE 12/22/2017 1530   LEUKOCYTESUR 3+ (A) 06/04/2017 1507   Sepsis Labs Invalid input(s): PROCALCITONIN,  WBC,  LACTICIDVEN Microbiology No results found for this or any previous visit (from the past 240 hour(s)).   Time coordinating discharge: 32  minutes  SIGNED:   Hosie Poisson, MD  [name] 12/27/2017, 10:46 AM Pager   If 7PM-7AM, please contact night-coverage [email or web address] Password [email or web address]

## 2017-12-28 ENCOUNTER — Encounter
Admission: RE | Admit: 2017-12-28 | Discharge: 2017-12-28 | Disposition: A | Discharge status: Home / Self Care | Payer: Medicare Other | Source: Ambulatory Visit | Attending: Internal Medicine | Admitting: Internal Medicine

## 2017-12-28 DIAGNOSIS — I69351 Hemiplegia and hemiparesis following cerebral infarction affecting right dominant side: Secondary | ICD-10-CM | POA: Diagnosis not present

## 2017-12-28 DIAGNOSIS — I69320 Aphasia following cerebral infarction: Secondary | ICD-10-CM | POA: Diagnosis not present

## 2017-12-28 DIAGNOSIS — Z515 Encounter for palliative care: Secondary | ICD-10-CM | POA: Diagnosis not present

## 2017-12-28 DIAGNOSIS — K219 Gastro-esophageal reflux disease without esophagitis: Secondary | ICD-10-CM | POA: Diagnosis not present

## 2017-12-31 DIAGNOSIS — K219 Gastro-esophageal reflux disease without esophagitis: Secondary | ICD-10-CM | POA: Diagnosis not present

## 2017-12-31 DIAGNOSIS — I69351 Hemiplegia and hemiparesis following cerebral infarction affecting right dominant side: Secondary | ICD-10-CM | POA: Diagnosis not present

## 2017-12-31 DIAGNOSIS — I69320 Aphasia following cerebral infarction: Secondary | ICD-10-CM | POA: Diagnosis not present

## 2018-01-01 ENCOUNTER — Other Ambulatory Visit: Payer: Self-pay | Admitting: Adult Health

## 2018-01-01 DIAGNOSIS — I69351 Hemiplegia and hemiparesis following cerebral infarction affecting right dominant side: Secondary | ICD-10-CM | POA: Diagnosis not present

## 2018-01-01 DIAGNOSIS — Z515 Encounter for palliative care: Secondary | ICD-10-CM | POA: Diagnosis not present

## 2018-01-01 DIAGNOSIS — I69320 Aphasia following cerebral infarction: Secondary | ICD-10-CM | POA: Diagnosis not present

## 2018-01-01 DIAGNOSIS — K219 Gastro-esophageal reflux disease without esophagitis: Secondary | ICD-10-CM | POA: Diagnosis not present

## 2018-01-01 MED ORDER — TRAMADOL HCL 50 MG PO TABS: 50.0000 mg | ORAL_TABLET | Freq: Three times a day (TID) | ORAL | 0 refills | Status: DC

## 2018-01-02 DIAGNOSIS — I69351 Hemiplegia and hemiparesis following cerebral infarction affecting right dominant side: Secondary | ICD-10-CM | POA: Diagnosis not present

## 2018-01-02 DIAGNOSIS — I69320 Aphasia following cerebral infarction: Secondary | ICD-10-CM | POA: Diagnosis not present

## 2018-01-02 DIAGNOSIS — K219 Gastro-esophageal reflux disease without esophagitis: Secondary | ICD-10-CM | POA: Diagnosis not present

## 2018-01-03 ENCOUNTER — Other Ambulatory Visit
Admission: RE | Admit: 2018-01-03 | Discharge: 2018-01-03 | Disposition: A | Discharge status: Home / Self Care | Payer: Medicare Other | Source: Ambulatory Visit | Attending: Internal Medicine | Admitting: Internal Medicine

## 2018-01-03 DIAGNOSIS — I69351 Hemiplegia and hemiparesis following cerebral infarction affecting right dominant side: Secondary | ICD-10-CM | POA: Diagnosis not present

## 2018-01-03 DIAGNOSIS — I69320 Aphasia following cerebral infarction: Secondary | ICD-10-CM | POA: Diagnosis not present

## 2018-01-03 DIAGNOSIS — E1122 Type 2 diabetes mellitus with diabetic chronic kidney disease: Secondary | ICD-10-CM | POA: Insufficient documentation

## 2018-01-03 DIAGNOSIS — K219 Gastro-esophageal reflux disease without esophagitis: Secondary | ICD-10-CM | POA: Diagnosis not present

## 2018-01-03 LAB — CBC WITH DIFFERENTIAL/PLATELET
Abs Immature Granulocytes: 0.08 10*3/uL — ABNORMAL HIGH (ref 0.00–0.07)
BASOS ABS: 0.1 10*3/uL (ref 0.0–0.1)
Basophils Relative: 1 %
EOS ABS: 0.3 10*3/uL (ref 0.0–0.5)
EOS PCT: 3 %
HCT: 36 % (ref 36.0–46.0)
Hemoglobin: 11.6 g/dL — ABNORMAL LOW (ref 12.0–15.0)
Immature Granulocytes: 1 %
Lymphocytes Relative: 26 %
Lymphs Abs: 2.6 10*3/uL (ref 0.7–4.0)
MCH: 31.9 pg (ref 26.0–34.0)
MCHC: 32.2 g/dL (ref 30.0–36.0)
MCV: 98.9 fL (ref 80.0–100.0)
Monocytes Absolute: 0.8 10*3/uL (ref 0.1–1.0)
Monocytes Relative: 8 %
NEUTROS PCT: 61 %
NRBC: 0 % (ref 0.0–0.2)
Neutro Abs: 5.9 10*3/uL (ref 1.7–7.7)
PLATELETS: 327 10*3/uL (ref 150–400)
RBC: 3.64 MIL/uL — AB (ref 3.87–5.11)
RDW: 12.5 % (ref 11.5–15.5)
WBC: 9.7 10*3/uL (ref 4.0–10.5)

## 2018-01-03 LAB — BASIC METABOLIC PANEL
Anion gap: 12 (ref 5–15)
BUN: 18 mg/dL (ref 8–23)
CHLORIDE: 103 mmol/L (ref 98–111)
CO2: 29 mmol/L (ref 22–32)
CREATININE: 1.05 mg/dL — AB (ref 0.44–1.00)
Calcium: 9.4 mg/dL (ref 8.9–10.3)
GFR calc non Af Amer: 47 mL/min — ABNORMAL LOW (ref >60)
GFR, EST AFRICAN AMERICAN: 54 mL/min — AB (ref >60)
Glucose, Bld: 90 mg/dL (ref 70–99)
Potassium: 3.4 mmol/L — ABNORMAL LOW (ref 3.5–5.1)
SODIUM: 144 mmol/L (ref 135–145)

## 2018-01-04 ENCOUNTER — Non-Acute Institutional Stay (SKILLED_NURSING_FACILITY): Payer: Medicare Other | Admitting: Adult Health

## 2018-01-04 DIAGNOSIS — I5032 Chronic diastolic (congestive) heart failure: Secondary | ICD-10-CM

## 2018-01-04 DIAGNOSIS — I130 Hypertensive heart and chronic kidney disease with heart failure and stage 1 through stage 4 chronic kidney disease, or unspecified chronic kidney disease: Secondary | ICD-10-CM | POA: Diagnosis not present

## 2018-01-04 DIAGNOSIS — I480 Paroxysmal atrial fibrillation: Secondary | ICD-10-CM

## 2018-01-04 DIAGNOSIS — K219 Gastro-esophageal reflux disease without esophagitis: Secondary | ICD-10-CM | POA: Diagnosis not present

## 2018-01-04 DIAGNOSIS — I69320 Aphasia following cerebral infarction: Secondary | ICD-10-CM | POA: Diagnosis not present

## 2018-01-04 DIAGNOSIS — N182 Chronic kidney disease, stage 2 (mild): Secondary | ICD-10-CM

## 2018-01-04 DIAGNOSIS — I69351 Hemiplegia and hemiparesis following cerebral infarction affecting right dominant side: Secondary | ICD-10-CM | POA: Diagnosis not present

## 2018-01-04 DIAGNOSIS — M5441 Lumbago with sciatica, right side: Secondary | ICD-10-CM

## 2018-01-04 DIAGNOSIS — G8929 Other chronic pain: Secondary | ICD-10-CM

## 2018-01-04 NOTE — Progress Notes (Signed)
Location:   [place] Room Number: 094 Place of Service:  SNF (31)   CODE STATUS: dnr  Allergies  Allergen Reactions  . Amlodipine Cough  . Clopidogrel Bisulfate Cough  . Kenalog [Triamcinolone Acetonide] Other (See Comments)    unknown  . Lisinopril Cough  . Losartan Cough  . Sulfa Antibiotics Hives and Rash    Chief Complaint  Patient presents with  . Medical Management of Chronic Issues    Hypertensive heart disease with congestive heart failure and stage 2 kidney disease; PAF (paroxysmal atrial fibrillation) CHA2DS2-VASc Score =6 on 15 mg xarelto; chronic diastolic CHF: chronic bilateral low back pain with right sided sciatica.  Weekly follow up for the first 30 days post hospitalization.     HPI:  Abigail Boyd is a 84 year old long term resident of this facility being seen for the management of her chronic illnesses: hypertensive heart disease; paf; diastolic heart failure; low back pain. Abigail Boyd is unable to fully participate in the phi or ros. Sh is having difficulty projecting her voice. There are no reports of uncontrolled pain; no changes in appetite; no indications of anxiety or agitation.   Past Medical History:  Diagnosis Date  . Abnormal nuclear stress test 06/21/2011   Inferolateral reversible defect;--> cardiac cath & PCI of CxOM, occluded RCA with collaterals;; followup Myoview 11/2012: Low risk. Fixed basal inferior artifact normal EF. No ischemia  . Anxiety   . Asthma   . Bilateral cataracts    Status post stroke or correction  . CAD S/P percutaneous coronary angioplasty 06/2011   s/P PCI to proximal OM1 w/ DES; occluded RCA with bridging and L-R collaterals (medical management)  . Chronic kidney disease (CKD), stage II (mild)    Related to current bladder infections (although diabetes cannot be excluded)  . Chronotropic incompetence with sinus node dysfunction [facility]) October 2013   On CPET test; beta blockers reduced  . Diabetes mellitus type 2,  controlled ([place])    On oral medications  . Diverticulitis   . Essential hypertension    Allowing for permissive hypertension to avoid orthostatic hypotension  . GERD (gastroesophageal reflux disease)    On PPI  . Glaucoma   . History of syncope    Per EP - neurocardiogenic & not Bradycardia related (no PPM)  . History of unstable angina 06/13/2011   Jaw pain awakening from sleep -- Myoview --CATH --> PCI  . Hyperlipidemia with target LDL less than 70    HDL at goal, LDL not at goal, borderline triglycerides. On Crestor 20 mg  . Migraines   . OSA (obstructive sleep apnea)    hx bladder infections  . Osteoarthritis   . PAF (paroxysmal atrial fibrillation) ([name]) 10-11/ 2014   CardioNet Event Monitor: NSR & S Brady -- Rates 50-100; Total A. fib burden 35 hours and 27 minutes. 1296 episodes, longest was 1 hour 29 minutes. Rate ranged from 52-169 beats per minute.  . Seasonal allergies   . Shortness of breath on exertion October 2013   2-D echo: Normal EF>55%, Gr 1DD, mild aortic sclerosis; Evaluated with CPET - peak VO2 97%; Chronotropic Incompetence (submaximal effort)  . Spinal stenosis of lumbar region 11/2011  . Stroke ([name]) 06/2017  . Stroke ([place]) 11/2014  . Tachycardia-bradycardia syndrome ([place]) 11/2012  . Urge incontinence     Past Surgical History:  Procedure Laterality Date  . APPENDECTOMY    . BREAST BIOPSY     both breast  . cataracts  both eyes  . COLONOSCOPY    . CORONARY ANGIOPLASTY WITH STENT PLACEMENT  07/05/2011   Promus Element DES 2.5 mm x 16 mm-post dilated to 2.65 mm CX-Proximal OM1  . Holter Monitor  04/2015   Sinus rhythm with rates 52-149 BPM. Isolated PACs with rare couplets and bigeminy. Multiple short runs of PAT/PSVT. Arrhythmia run 120 bpm for 204 beats. 14% of time was in A. fib/flutter. - Reviewed by Dr. Caryl Comes. Not thought to be significant enough for her syncope.  Marland Kitchen KNEE ARTHROSCOPY WITH MEDIAL MENISECTOMY Right 06/24/2014   Procedure: RIGHT KNEE  ARTHROSCOPY WITH partial lateral MENISECTOMY, abrasion chondroplasty of medial femoral condyle and patella, microfracture technique;  Surgeon: Latanya Maudlin, MD;  Location: [facility];  Service: Orthopedics;  Laterality: Right;  . LEFT HEART CATHETERIZATION WITH CORONARY ANGIOGRAM N/A 07/06/2011   Procedure: LEFT HEART CATHETERIZATION WITH CORONARY ANGIOGRAM;  Surgeon: Leonie Man, MD;  Location: [facility] CATH LAB: Unstable Angina --> Myoview wiht Inf-Lat Ischemia.  Proximal OM1 lesion --> PCI; 100% mid RCA with right to right and left to right bridging collaterals from circumflex RPL and LAD the PDA.  Marland Kitchen NM MYOVIEW LTD  October 2014    Low risk. Fixed basal inferior artifact normal EF. No ischemia --> (as compared to pre-PCI Myoview revealing inferolateral ischemia)  . rotator cuff Right   . TRANSTHORACIC ECHOCARDIOGRAM  11/2014   EF 65-70%, Mod LVH. Normal wall motion. Gr 1 DD. Normal valves.  Marland Kitchen VAGINAL HYSTERECTOMY      Social History   Socioeconomic History  . Marital status: Widowed    Spouse name: Not on file  . Number of children: Not on file  . Years of education: Not on file  . Highest education level: Not on file  Occupational History  . Occupation: retired  Scientific laboratory technician  . Financial resource strain: Not hard at all  . Food insecurity:    Worry: Patient refused    Inability: Patient refused  . Transportation needs:    Medical: Patient refused    Non-medical: Patient refused  Tobacco Use  . Smoking status: Never Smoker  . Smokeless tobacco: Never Used  Substance and Sexual Activity  . Alcohol use: No  . Drug use: No  . Sexual activity: Never    Birth control/protection: Post-menopausal  Lifestyle  . Physical activity:    Days per week: Patient refused    Minutes per session: Patient refused  . Stress: Only a little  Relationships  . Social connections:    Talks on phone: Patient refused    Gets together: Patient refused    Attends religious service: Patient refused     Active member of club or organization: Patient refused    Attends meetings of clubs or organizations: Patient refused    Relationship status: Patient refused  . Intimate partner violence:    Fear of current or ex partner: Patient refused    Emotionally abused: Patient refused    Physically abused: Patient refused    Forced sexual activity: Patient refused  Other Topics Concern  . Not on file  Social History Narrative   Widowed mother of one, grandmother 2. Has not been using silver take as much lately due to knee discomfort. Usually exercises with Silver Sneakers on regular basis.   Family History  Problem Relation Age of Onset  . COPD Mother   . Pulmonary fibrosis Mother   . Heart attack Father   . Heart disease Father   . Stroke Father   .  Cancer Sister   . Cancer Sister   . Clotting disorder Sister   . Heart attack Sister   . Arthritis Unknown   . Breast cancer Sister   . Colon cancer Neg Hx   . Esophageal cancer Neg Hx   . Stomach cancer Neg Hx       VITAL SIGNS BP 135/65   Pulse 76   Temp 98.6 F (37 C)   Resp 16   Ht 5\' 5"  (1.651 m)   Wt 165 lb (74.8 kg)   SpO2 99%   BMI 27.46 kg/m   Outpatient Encounter Medications as of 01/04/2018  Medication Sig  . acetaminophen (TYLENOL) 650 MG CR tablet Take 650 mg by mouth every 6 (six) hours as needed for pain.  Marland Kitchen atorvastatin (LIPITOR) 80 MG tablet Take 80 mg by mouth daily at 6 PM.  . azelastine (ASTELIN) 0.1 % nasal spray Place 2 sprays into both nostrils at bedtime. Use in each nostril as directed   . divalproex (DEPAKOTE) 125 MG DR tablet Take 250 mg by mouth at bedtime.  . docusate sodium (STOOL SOFTENER) 100 MG capsule Take 100 mg by mouth daily.   . fish oil-omega-3 fatty acids 1000 MG capsule Take 1 g by mouth daily.   . furosemide (LASIX) 20 MG tablet Take 20 mg by mouth daily.  Marland Kitchen gabapentin (NEURONTIN) 300 MG capsule Take 300 mg by mouth 3 (three) times daily.   Marland Kitchen glucagon (GLUCAGON EMERGENCY) 1 MG  injection Inject 1 mg into the vein once as needed. Give 1 mg IM for Hypoglycemia (symptomatic) not responding to oral glucose, snack. May repeat x 1 after 15 minutes. USE ONLY IF PT IS OR IS BECOMING UNRESPONSIVE  . HUMALOG KWIKPEN 100 UNIT/ML KiwkPen Inject 22 Units into the skin 3 (three) times daily with meals. DM2. Hold for CBG < 200  . Infant Care Products [facility] EX) Apply liberal amount topically to area of skin irritation prn. OK to leave at bedside.  . Insulin Glargine (TOUJEO MAX SOLOSTAR) 300 UNIT/ML SOPN Inject 75 Units into the skin at bedtime. Alternate abdominal injection sites.  . latanoprost (XALATAN) 0.005 % ophthalmic solution Place 1 drop into both eyes at bedtime.  . levETIRAcetam (KEPPRA) 500 MG tablet Take 1 tablet (500 mg total) by mouth 2 (two) times daily.  Marland Kitchen levocetirizine (XYZAL) 5 MG tablet Take 2.5 mg by mouth daily.   . Lidocaine-Menthol (ICY HOT LIDOCAINE PLUS MENTHOL) 4-1 % PTCH Apply 1 patch topically daily. apply to lower back in the a.m. for pain management  . Magnesium 500 MG CAPS Take 500 mg by mouth at bedtime.   . metFORMIN (GLUCOPHAGE-XR) 500 MG 24 hr tablet Take 500 mg by mouth daily with breakfast.   . metoprolol tartrate (LOPRESSOR) 25 MG tablet Take 1 tablet (25 mg total) by mouth 2 (two) times daily.  . mirtazapine (REMERON SOL-TAB) 15 MG disintegrating tablet Take 0.5 tablets (7.5 mg total) by mouth at bedtime.  . nitroGLYCERIN (NITROSTAT) 0.4 MG SL tablet Place 0.4 mg under the tongue every 5 (five) minutes as needed for chest pain.  Marland Kitchen omeprazole (PRILOSEC) 40 MG capsule Take 40 mg by mouth daily.  Marland Kitchen oxybutynin (DITROPAN) 5 MG tablet Take 5 mg by mouth 2 (two) times daily.  Marland Kitchen PARoxetine (PAXIL) 20 MG tablet Take 1 tablet (20 mg total) by mouth daily.  . polyethylene glycol (MIRALAX / GLYCOLAX) packet Take 17 g by mouth daily as needed for moderate constipation.  . Rivaroxaban (XARELTO)  15 MG TABS tablet Take 15 mg by mouth every evening.   Marland Kitchen  rOPINIRole (REQUIP) 0.5 MG tablet TAKE 1 TABLET BY MOUTH AT  BEDTIME  . timolol (TIMOPTIC) 0.5 % ophthalmic solution Place 1 drop into both eyes at bedtime.   . traMADol (ULTRAM) 50 MG tablet Take 1 tablet (50 mg total) by mouth 3 (three) times daily. At 6 am 12 pm and 10 pm  . trolamine salicylate (ASPERCREME) 10 % cream Apply 1 application topically 3 (three) times daily. Apply to right knee  . [DISCONTINUED] atorvastatin (LIPITOR) 80 MG tablet Take 1 tablet (80 mg total) by mouth daily at 6 PM. (Patient taking differently: Take 80 mg by mouth daily. )  . [DISCONTINUED] pantoprazole (PROTONIX) 40 MG tablet Take 1 tablet (40 mg total) by mouth daily.   No facility-administered encounter medications on file as of 01/04/2018.      SIGNIFICANT DIAGNOSTIC EXAMS  TODAY;   12-21-17: ct of head:  1. No acute intracranial abnormality identified. 2. ASPECTS is 10. 3. Stable chronic microvascular ischemic changes, volume loss, and small chronic infarcts of the basal ganglia. 4. Chronic infarct of the left lentiform nucleus, caudate body, and corona radiata corresponding to the prior acute infarction on 07/22/2017 MRI of the brain. 5. Sphenoid sinus opacification with fluid level, progressed from prior study, compatible with acute sinusitis in the appropriate clinical setting.  12-21-17: MRI of brain: 1. Motion degraded examination.  No acute intracranial process. 2. Multiple old small supra- and infratentorial infarcts. 3. Moderate to severe parenchymal brain volume loss.  12-22-17: chest x-ray: No active disease.    LABS REVIEWED: PREVIOUS  07-21-17: wbc 10.1; hgb 13.8; hct 43.8; mcv 94.5; plt 323 glucose 221; bun 18; creat 1.21; k+ 4.2; na++ 136; ca 9.7 liver normal albumin 3.6  07-22-17: hgb a1c 7.8 tsh 4.194 vit B 12: 342 07-24-17: wbc 10.5; hgb 12.9; hct 39.5; mcv 93.8 plt 331; glucose 121 bun 18; creat 1.13;  4.1; na++ 144; ca 9.4 chol 234; ldl 157; trig 218; hdl 33  10-09-17: urine  micro-albumin: 5.9 10-25-17: mag 2.1  10-31-17: wbc 10.2; hgb 11.1; hct 32.5 ;mcv 93.8; plt 309 glucose 170; bun 19; creat 1.02; k+ 4.1; na++138; ca 9.1; liver normal albumin 3.2 urine culture: multiple species  11-08-17: urine culture: e-coli   TODAY:   12-21-17: wbc 10.4; hgb 11.5; hct 36.8; mcv 100.3; plt 317; glucose 87; bun 14; creat 1.22; k+ 3.9; na++ 140; ca 9.7; liver normal albumin 3.1  12-22-17: tsh 3.223 12-24-17: wbc 13.6; hgb 12.3; hct 37.6; mcv 95.9; plt 371; glucose 101; bun 7; creat 0.85; k+ 3.3; na++ 140; ca 9.6  12-16-17: wbc 12.0; gb 12.4; hct 38.5; mcv 98.0; plt 342; glucose 194; bun 14; creat 0.93; k+ 3.7; na++ 142; ca 9.5 01-03-18: wbc  9.7; hgb 11.6; hct 36.0; mcv 98.9 plt 327; glucose 90; bun 18; creat 1.05; k+ 3.4; na++ 144; ca 9.4     Review of Systems  Reason unable to perform ROS: poor historian     Physical Exam  Constitutional: Abigail Boyd appears well-developed and well-nourished. No distress.  Neck: Thyromegaly present.  Mild thyroid enlargement   Cardiovascular: Normal rate, regular rhythm, normal heart sounds and intact distal pulses.  Pulmonary/Chest: Effort normal and breath sounds normal. No respiratory distress.  Abdominal: Soft. Bowel sounds are normal. Abigail Boyd exhibits no distension. There is no tenderness.  Musculoskeletal: Abigail Boyd exhibits no edema.  Has mild generalized weakness present Is able to move all extremities  Lymphadenopathy:    Abigail Boyd has no cervical adenopathy.  Neurological: Abigail Boyd is alert.  Skin: Skin is warm and dry. Abigail Boyd is not diaphoretic.  Psychiatric: Abigail Boyd has a normal mood and affect.      ASSESSMENT/ PLAN:  TODAY:   1. Hypertensive heart disease with congestive heart failure and stage 2 kidney disease: is stable b/p 135/65: will continue lopressor 25 mg twice daily   2. PAF( paroxymal atrial fibrillation) CHA2DS2- VASc score+6: heart rate is stable; will continue lopressor 25 mg twice daily for rate control and xarelto 15 mg daily    3.  Chronic diastolic heart failure: is stable will continue lasix 20 mg daily   4. Chronic bilateral lower back pain with right sided sciatica: is stable; will continue ultram  50 mg three times daily  lidoderm patch to her back daily ans aspercreme to right knee   PREVIOUS   5. CAD-PCI OM1, 100% RCA (l-R collaterals): is stable will continue lopressor 25 mg twice daily has prn ntg is on statin  6. Dyslipidemia associated with type 2 diabetes mellitus: stable LDL 157 trig 218 : will continue lipitor 80 mg daily and fish oil 1 gm daily   7.  Diabetes mellitus type 2 with neurological manifestations: is stable hgb a1c 7.8: will continue humalog 22 units with meals and toujeo 75 units nightly metformin xr 500 mg daily   8. Acute CVA: is neurologically stable will continue xarelto 15 mg daily   9. Diabetic neuropathy: is stable will continue neurontin 300 mg three times daily   10.  GERD without esophagitis: stable will continue prolisec 40 mg daily   11.  Chronic constipation: is stable will continue colace nightly and miralax daily as needed  12.  Allergic rhinitis: is stable will continue astelin nasal spray nightly and xyzal 2.5 mg daily   13. UI: without change: will continue ditropan 5 mg twice daily   14.  Bilateral chronic glaucoma: is stable will continue timoptic to both eyes and lumigan to both eyes.   15. PLMD (periodic limb movement disorder) is stable will continue requip 0.5 mg nightly   16. Anxiety: is stable will continue paxil 20 mg daily   17. CKD stage 2 due to type 2 diabetes mellitus: is stable bun 18; creat 1.05         MD is aware of resident's narcotic use and is in agreement with current plan of care. We will attempt to wean resident as apropriate   Ok Edwards NP [facility]  Contact [phone] Monday through Friday 8am- 5pm  After hours call [phone]

## 2018-01-07 DIAGNOSIS — I69351 Hemiplegia and hemiparesis following cerebral infarction affecting right dominant side: Secondary | ICD-10-CM | POA: Diagnosis not present

## 2018-01-07 DIAGNOSIS — I69320 Aphasia following cerebral infarction: Secondary | ICD-10-CM | POA: Diagnosis not present

## 2018-01-07 DIAGNOSIS — K219 Gastro-esophageal reflux disease without esophagitis: Secondary | ICD-10-CM | POA: Diagnosis not present

## 2018-01-08 DIAGNOSIS — K219 Gastro-esophageal reflux disease without esophagitis: Secondary | ICD-10-CM | POA: Diagnosis not present

## 2018-01-08 DIAGNOSIS — I69351 Hemiplegia and hemiparesis following cerebral infarction affecting right dominant side: Secondary | ICD-10-CM | POA: Diagnosis not present

## 2018-01-08 DIAGNOSIS — I69320 Aphasia following cerebral infarction: Secondary | ICD-10-CM | POA: Diagnosis not present

## 2018-01-09 DIAGNOSIS — I69320 Aphasia following cerebral infarction: Secondary | ICD-10-CM | POA: Diagnosis not present

## 2018-01-09 DIAGNOSIS — K219 Gastro-esophageal reflux disease without esophagitis: Secondary | ICD-10-CM | POA: Diagnosis not present

## 2018-01-09 DIAGNOSIS — I69351 Hemiplegia and hemiparesis following cerebral infarction affecting right dominant side: Secondary | ICD-10-CM | POA: Diagnosis not present

## 2018-01-10 DIAGNOSIS — I69351 Hemiplegia and hemiparesis following cerebral infarction affecting right dominant side: Secondary | ICD-10-CM | POA: Diagnosis not present

## 2018-01-10 DIAGNOSIS — I69320 Aphasia following cerebral infarction: Secondary | ICD-10-CM | POA: Diagnosis not present

## 2018-01-10 DIAGNOSIS — K219 Gastro-esophageal reflux disease without esophagitis: Secondary | ICD-10-CM | POA: Diagnosis not present

## 2018-01-11 ENCOUNTER — Encounter: Payer: Self-pay | Admitting: Adult Health

## 2018-01-11 ENCOUNTER — Non-Acute Institutional Stay (SKILLED_NURSING_FACILITY): Payer: Medicare Other | Admitting: Adult Health

## 2018-01-11 DIAGNOSIS — M5441 Lumbago with sciatica, right side: Secondary | ICD-10-CM

## 2018-01-11 DIAGNOSIS — I69351 Hemiplegia and hemiparesis following cerebral infarction affecting right dominant side: Secondary | ICD-10-CM | POA: Diagnosis not present

## 2018-01-11 DIAGNOSIS — I480 Paroxysmal atrial fibrillation: Secondary | ICD-10-CM | POA: Diagnosis not present

## 2018-01-11 DIAGNOSIS — I130 Hypertensive heart and chronic kidney disease with heart failure and stage 1 through stage 4 chronic kidney disease, or unspecified chronic kidney disease: Secondary | ICD-10-CM | POA: Diagnosis not present

## 2018-01-11 DIAGNOSIS — G8929 Other chronic pain: Secondary | ICD-10-CM

## 2018-01-11 DIAGNOSIS — I69320 Aphasia following cerebral infarction: Secondary | ICD-10-CM | POA: Diagnosis not present

## 2018-01-11 DIAGNOSIS — N182 Chronic kidney disease, stage 2 (mild): Secondary | ICD-10-CM

## 2018-01-11 DIAGNOSIS — I5032 Chronic diastolic (congestive) heart failure: Secondary | ICD-10-CM

## 2018-01-11 DIAGNOSIS — K219 Gastro-esophageal reflux disease without esophagitis: Secondary | ICD-10-CM | POA: Diagnosis not present

## 2018-01-11 NOTE — Progress Notes (Signed)
Location:   [facility] Room Number: [place] of Service:  SNF (31)   CODE STATUS: DNR  Allergies  Allergen Reactions  . Amlodipine Cough  . Clopidogrel Bisulfate Cough  . Kenalog [Triamcinolone Acetonide] Other (See Comments)    unknown  . Lisinopril Cough  . Losartan Cough  . Sulfa Antibiotics Hives and Rash    Chief Complaint  Patient presents with  . Medical Management of Chronic Issues    Hypertensive heart disease with congestive heart failure and stage 2 kidney disease; PAF(paroxysmal atrial fibrillation) chronic diastolic heart failure; chronic bilateral low back pain with right sided sciatica. Weekly follow up for the first 30 days post hospitalization.     HPI:  She is a 84 year old long term resident of this facility being seen for the management of her chronic illnesses: hypertensive heart disease; paf; chronic diastolic heart failure; back pain. She is unable to fully participate in the hpi or ros. There are no reports of any uncontrolled pain; no reports of chest pain; shortness of breath; or leg swelling.   Past Medical History:  Diagnosis Date  . Abnormal nuclear stress test 06/21/2011   Inferolateral reversible defect;--> cardiac cath & PCI of CxOM, occluded RCA with collaterals;; followup Myoview 11/2012: Low risk. Fixed basal inferior artifact normal EF. No ischemia  . Anxiety   . Asthma   . Bilateral cataracts    Status post stroke or correction  . CAD S/P percutaneous coronary angioplasty 06/2011   s/P PCI to proximal OM1 w/ DES; occluded RCA with bridging and L-R collaterals (medical management)  . Chronic kidney disease (CKD), stage II (mild)    Related to current bladder infections (although diabetes cannot be excluded)  . Chronotropic incompetence with sinus node dysfunction [facility]) October 2013   On CPET test; beta blockers reduced  . Diabetes mellitus type 2, controlled ([place])    On oral medications  . Diverticulitis     . Essential hypertension    Allowing for permissive hypertension to avoid orthostatic hypotension  . GERD (gastroesophageal reflux disease)    On PPI  . Glaucoma   . History of syncope    Per EP - neurocardiogenic & not Bradycardia related (no PPM)  . History of unstable angina 06/13/2011   Jaw pain awakening from sleep -- Myoview --CATH --> PCI  . Hyperlipidemia with target LDL less than 70    HDL at goal, LDL not at goal, borderline triglycerides. On Crestor 20 mg  . Migraines   . OSA (obstructive sleep apnea)    hx bladder infections  . Osteoarthritis   . PAF (paroxysmal atrial fibrillation) ([place]) 10-11/ 2014   CardioNet Event Monitor: NSR & S Brady -- Rates 50-100; Total A. fib burden 35 hours and 27 minutes. 1296 episodes, longest was 1 hour 29 minutes. Rate ranged from 52-169 beats per minute.  . Seasonal allergies   . Shortness of breath on exertion October 2013   2-D echo: Normal EF>55%, Gr 1DD, mild aortic sclerosis; Evaluated with CPET - peak VO2 97%; Chronotropic Incompetence (submaximal effort)  . Spinal stenosis of lumbar region 11/2011  . Stroke ([place]) 06/2017  . Stroke ([place]) 11/2014  . Tachycardia-bradycardia syndrome ([place]) 11/2012  . Urge incontinence     Past Surgical History:  Procedure Laterality Date  . APPENDECTOMY    . BREAST BIOPSY     both breast  . cataracts     both eyes  . COLONOSCOPY    .  CORONARY ANGIOPLASTY WITH STENT PLACEMENT  07/05/2011   Promus Element DES 2.5 mm x 16 mm-post dilated to 2.65 mm CX-Proximal OM1  . Holter Monitor  04/2015   Sinus rhythm with rates 52-149 BPM. Isolated PACs with rare couplets and bigeminy. Multiple short runs of PAT/PSVT. Arrhythmia run 120 bpm for 204 beats. 14% of time was in A. fib/flutter. - Reviewed by Dr. Caryl Comes. Not thought to be significant enough for her syncope.  Marland Kitchen KNEE ARTHROSCOPY WITH MEDIAL MENISECTOMY Right 06/24/2014   Procedure: RIGHT KNEE ARTHROSCOPY WITH partial lateral MENISECTOMY, abrasion  chondroplasty of medial femoral condyle and patella, microfracture technique;  Surgeon: Latanya Maudlin, MD;  Location: [facility];  Service: Orthopedics;  Laterality: Right;  . LEFT HEART CATHETERIZATION WITH CORONARY ANGIOGRAM N/A 07/06/2011   Procedure: LEFT HEART CATHETERIZATION WITH CORONARY ANGIOGRAM;  Surgeon: Leonie Man, MD;  Location: [facility] CATH LAB: Unstable Angina --> Myoview wiht Inf-Lat Ischemia.  Proximal OM1 lesion --> PCI; 100% mid RCA with right to right and left to right bridging collaterals from circumflex RPL and LAD the PDA.  Marland Kitchen NM MYOVIEW LTD  October 2014    Low risk. Fixed basal inferior artifact normal EF. No ischemia --> (as compared to pre-PCI Myoview revealing inferolateral ischemia)  . rotator cuff Right   . TRANSTHORACIC ECHOCARDIOGRAM  11/2014   EF 65-70%, Mod LVH. Normal wall motion. Gr 1 DD. Normal valves.  Marland Kitchen VAGINAL HYSTERECTOMY      Social History   Socioeconomic History  . Marital status: Widowed    Spouse name: Not on file  . Number of children: Not on file  . Years of education: Not on file  . Highest education level: Not on file  Occupational History  . Occupation: retired  Scientific laboratory technician  . Financial resource strain: Not hard at all  . Food insecurity:    Worry: Patient refused    Inability: Patient refused  . Transportation needs:    Medical: Patient refused    Non-medical: Patient refused  Tobacco Use  . Smoking status: Never Smoker  . Smokeless tobacco: Never Used  Substance and Sexual Activity  . Alcohol use: No  . Drug use: No  . Sexual activity: Never    Birth control/protection: Post-menopausal  Lifestyle  . Physical activity:    Days per week: Patient refused    Minutes per session: Patient refused  . Stress: Only a little  Relationships  . Social connections:    Talks on phone: Patient refused    Gets together: Patient refused    Attends religious service: Patient refused    Active member of club or organization: Patient refused      Attends meetings of clubs or organizations: Patient refused    Relationship status: Patient refused  . Intimate partner violence:    Fear of current or ex partner: Patient refused    Emotionally abused: Patient refused    Physically abused: Patient refused    Forced sexual activity: Patient refused  Other Topics Concern  . Not on file  Social History Narrative   Widowed mother of one, grandmother 2. Has not been using silver take as much lately due to knee discomfort. Usually exercises with Silver Sneakers on regular basis.   Family History  Problem Relation Age of Onset  . COPD Mother   . Pulmonary fibrosis Mother   . Heart attack Father   . Heart disease Father   . Stroke Father   . Cancer Sister   . Cancer Sister   .  Clotting disorder Sister   . Heart attack Sister   . Arthritis Unknown   . Breast cancer Sister   . Colon cancer Neg Hx   . Esophageal cancer Neg Hx   . Stomach cancer Neg Hx       VITAL SIGNS Ht 5\' 4"  (1.626 m)   BMI 28.32 kg/m   Outpatient Encounter Medications as of 01/11/2018  Medication Sig  . acetaminophen (TYLENOL) 650 MG CR tablet Take 650 mg by mouth every 6 (six) hours as needed for pain.  Marland Kitchen atorvastatin (LIPITOR) 80 MG tablet Take 80 mg by mouth daily at 6 PM.  . azelastine (ASTELIN) 0.1 % nasal spray Place 2 sprays into both nostrils at bedtime. Use in each nostril as directed   . divalproex (DEPAKOTE) 125 MG DR tablet Take 250 mg by mouth at bedtime.  . docusate sodium (STOOL SOFTENER) 100 MG capsule Take 100 mg by mouth daily.   . fish oil-omega-3 fatty acids 1000 MG capsule Take 1 g by mouth daily.   . furosemide (LASIX) 20 MG tablet Take 20 mg by mouth daily.  Marland Kitchen gabapentin (NEURONTIN) 300 MG capsule Take 300 mg by mouth 3 (three) times daily.   Marland Kitchen glucagon (GLUCAGON EMERGENCY) 1 MG injection Inject 1 mg into the vein once as needed. Give 1 mg IM for Hypoglycemia (symptomatic) not responding to oral glucose, snack. May repeat x 1 after  15 minutes. USE ONLY IF PT IS OR IS BECOMING UNRESPONSIVE  . HUMALOG KWIKPEN 100 UNIT/ML KiwkPen Inject 22 Units into the skin 3 (three) times daily with meals. DM2. Hold for CBG < 200  . Infant Care Products [place] EX) Apply liberal amount topically to area of skin irritation prn. OK to leave at bedside.  . Insulin Glargine (TOUJEO MAX SOLOSTAR) 300 UNIT/ML SOPN Inject 75 Units into the skin at bedtime. Alternate abdominal injection sites.  . latanoprost (XALATAN) 0.005 % ophthalmic solution Place 1 drop into both eyes at bedtime.  . levETIRAcetam (KEPPRA) 500 MG tablet Take 1 tablet (500 mg total) by mouth 2 (two) times daily.  Marland Kitchen levocetirizine (XYZAL) 5 MG tablet Take 2.5 mg by mouth daily.   . Lidocaine-Menthol (ICY HOT LIDOCAINE PLUS MENTHOL) 4-1 % PTCH Apply 1 patch topically daily. apply to lower back in the a.m. for pain management  . Magnesium 500 MG CAPS Take 500 mg by mouth at bedtime.   . metFORMIN (GLUCOPHAGE-XR) 500 MG 24 hr tablet Take 500 mg by mouth daily with breakfast.   . metoprolol tartrate (LOPRESSOR) 25 MG tablet Take 1 tablet (25 mg total) by mouth 2 (two) times daily.  . mirtazapine (REMERON SOL-TAB) 15 MG disintegrating tablet Take 0.5 tablets (7.5 mg total) by mouth at bedtime.  . nitroGLYCERIN (NITROSTAT) 0.4 MG SL tablet Place 0.4 mg under the tongue every 5 (five) minutes as needed for chest pain.  . NON FORMULARY Diet Order: Upgrade patient to dysphagia 2 consistencies (ground), continue thin liquids; Straws ok  . omeprazole (PRILOSEC) 40 MG capsule Take 40 mg by mouth daily.  Marland Kitchen oxybutynin (DITROPAN) 5 MG tablet Take 5 mg by mouth 2 (two) times daily.  . OXYGEN Inhale 2 L/min into the lungs 2 (two) times daily as needed.  Marland Kitchen PARoxetine (PAXIL) 20 MG tablet Take 1 tablet (20 mg total) by mouth daily.  . polyethylene glycol (MIRALAX / GLYCOLAX) packet Take 17 g by mouth daily as needed for moderate constipation.  . Rivaroxaban (XARELTO) 15 MG TABS tablet Take 15 mg  by mouth every evening.   Marland Kitchen rOPINIRole (REQUIP) 0.5 MG tablet TAKE 1 TABLET BY MOUTH AT  BEDTIME  . timolol (TIMOPTIC) 0.5 % ophthalmic solution Place 1 drop into both eyes at bedtime.   . traMADol (ULTRAM) 50 MG tablet Take 1 tablet (50 mg total) by mouth 3 (three) times daily. At 6 am 12 pm and 10 pm  . trolamine salicylate (ASPERCREME) 10 % cream Apply 1 application topically 3 (three) times daily. Apply to right knee   No facility-administered encounter medications on file as of 01/11/2018.      SIGNIFICANT DIAGNOSTIC EXAMS  PREVIOUS;  12-21-17: ct of head:  1. No acute intracranial abnormality identified. 2. ASPECTS is 10. 3. Stable chronic microvascular ischemic changes, volume loss, and small chronic infarcts of the basal ganglia. 4. Chronic infarct of the left lentiform nucleus, caudate body, and corona radiata corresponding to the prior acute infarction on 07/22/2017 MRI of the brain. 5. Sphenoid sinus opacification with fluid level, progressed from prior study, compatible with acute sinusitis in the appropriate clinical setting.  12-21-17: MRI of brain: 1. Motion degraded examination.  No acute intracranial process. 2. Multiple old small supra- and infratentorial infarcts. 3. Moderate to severe parenchymal brain volume loss.  12-22-17: chest x-ray: No active disease.  NO NEW EXAMS.     LABS REVIEWED: PREVIOUS  07-21-17: wbc 10.1; hgb 13.8; hct 43.8; mcv 94.5; plt 323 glucose 221; bun 18; creat 1.21; k+ 4.2; na++ 136; ca 9.7 liver normal albumin 3.6  07-22-17: hgb a1c 7.8 tsh 4.194 vit B 12: 342 07-24-17: wbc 10.5; hgb 12.9; hct 39.5; mcv 93.8 plt 331; glucose 121 bun 18; creat 1.13;  4.1; na++ 144; ca 9.4 chol 234; ldl 157; trig 218; hdl 33  10-09-17: urine micro-albumin: 5.9 10-25-17: mag 2.1  10-31-17: wbc 10.2; hgb 11.1; hct 32.5 ;mcv 93.8; plt 309 glucose 170; bun 19; creat 1.02; k+ 4.1; na++138; ca 9.1; liver normal albumin 3.2 urine culture: multiple species  11-08-17:  urine culture: e-coli  12-21-17: wbc 10.4; hgb 11.5; hct 36.8; mcv 100.3; plt 317; glucose Abigail; bun 14; creat 1.22; k+ 3.9; na++ 140; ca 9.7; liver normal albumin 3.1  12-22-17: tsh 3.223 12-24-17: wbc 13.6; hgb 12.3; hct 37.6; mcv 95.9; plt 371; glucose 101; bun 7; creat 0.85; k+ 3.3; na++ 140; ca 9.6  12-16-17: wbc 12.0; gb 12.4; hct 38.5; mcv 98.0; plt 342; glucose 194; bun 14; creat 0.93; k+ 3.7; na++ 142; ca 9.5 01-03-18: wbc  9.7; hgb 11.6; hct 36.0; mcv 98.9 plt 327; glucose 90; bun 18; creat 1.05; k+ 3.4; na++ 144; ca 9.4  NO NEW LABS    Review of Systems  Reason unable to perform ROS: poor historian      Physical Exam  Constitutional: She appears well-developed and well-nourished. No distress.  Neck: Thyromegaly present.  Very mild   Cardiovascular: Normal rate, regular rhythm, normal heart sounds and intact distal pulses.  Pulmonary/Chest: Effort normal and breath sounds normal. No respiratory distress.  Abdominal: Soft. Bowel sounds are normal. She exhibits no distension. There is no tenderness.  Musculoskeletal: She exhibits no edema.  Has mild generalized weakness present Is able to move all extremities    Lymphadenopathy:    She has no cervical adenopathy.  Neurological: She is alert.  Skin: Skin is warm and dry. She is not diaphoretic.  Psychiatric: She has a normal mood and affect.    ASSESSMENT/ PLAN:  TODAY:   1. Hypertensive heart disease with congestive heart  failure and stage 2 kidney disease: is stable : will continue lopressor 25 mg twice daily   2. PAF( paroxymal atrial fibrillation) CHA2DS2- VASc score+6: heart rate is stable; will continue lopressor 25 mg twice daily for rate control and xarelto 15 mg daily   3.  Chronic diastolic heart failure: is stable will continue lasix 20 mg daily   4. Chronic bilateral lower back pain with right sided sciatica: is stable; will continue ultram  50 mg three times daily  lidoderm patch to her back daily ans  aspercreme to right knee   PREVIOUS   5. CAD-PCI OM1, 100% RCA (l-R collaterals): is stable will continue lopressor 25 mg twice daily has prn ntg is on statin  6. Dyslipidemia associated with type 2 diabetes mellitus: stable LDL 157 trig 218 : will continue lipitor 80 mg daily and fish oil 1 gm daily   7.  Diabetes mellitus type 2 with neurological manifestations: is stable hgb a1c 7.8: will continue humalog 22 units with meals and toujeo 75 units nightly metformin xr 500 mg daily   8. Acute CVA: is neurologically stable will continue xarelto 15 mg daily   9. Diabetic neuropathy: is stable will continue neurontin 300 mg three times daily   10.  GERD without esophagitis: stable will continue prolisec 40 mg daily   11.  Chronic constipation: is stable will continue colace nightly and miralax daily as needed  12.  Allergic rhinitis: is stable will continue astelin nasal spray nightly and xyzal 2.5 mg daily   13. UI: without change: will continue ditropan 5 mg twice daily   14.  Bilateral chronic glaucoma: is stable will continue timoptic to both eyes and lumigan to both eyes.   15. PLMD (periodic limb movement disorder) is stable will continue requip 0.5 mg nightly   16. Anxiety: is stable will continue paxil 20 mg daily   17. CKD stage 2 due to type 2 diabetes mellitus: is stable bun 18; creat 1.05     MD is aware of resident's narcotic use and is in agreement with current plan of care. We will attempt to wean resident as apropriate   Ok Edwards NP [facility]  Contact [phone] Monday through Friday 8am- 5pm  After hours call [phone]

## 2018-01-14 ENCOUNTER — Encounter: Payer: Self-pay | Admitting: Adult Health

## 2018-01-14 ENCOUNTER — Non-Acute Institutional Stay (SKILLED_NURSING_FACILITY): Payer: Medicare Other | Admitting: Adult Health

## 2018-01-14 ENCOUNTER — Other Ambulatory Visit
Admission: RE | Admit: 2018-01-14 | Discharge: 2018-01-14 | Disposition: A | Discharge status: Home / Self Care | Payer: Medicare Other | Source: Ambulatory Visit | Attending: Adult Health | Admitting: Adult Health

## 2018-01-14 DIAGNOSIS — I69320 Aphasia following cerebral infarction: Secondary | ICD-10-CM | POA: Diagnosis not present

## 2018-01-14 DIAGNOSIS — E1122 Type 2 diabetes mellitus with diabetic chronic kidney disease: Secondary | ICD-10-CM | POA: Insufficient documentation

## 2018-01-14 DIAGNOSIS — B370 Candidal stomatitis: Secondary | ICD-10-CM | POA: Diagnosis not present

## 2018-01-14 DIAGNOSIS — K219 Gastro-esophageal reflux disease without esophagitis: Secondary | ICD-10-CM | POA: Diagnosis not present

## 2018-01-14 DIAGNOSIS — I69351 Hemiplegia and hemiparesis following cerebral infarction affecting right dominant side: Secondary | ICD-10-CM | POA: Diagnosis not present

## 2018-01-14 LAB — BASIC METABOLIC PANEL
Anion gap: 7 (ref 5–15)
BUN: 22 mg/dL (ref 8–23)
CALCIUM: 9 mg/dL (ref 8.9–10.3)
CHLORIDE: 104 mmol/L (ref 98–111)
CO2: 32 mmol/L (ref 22–32)
CREATININE: 1.06 mg/dL — AB (ref 0.44–1.00)
GFR calc Af Amer: 54 mL/min — ABNORMAL LOW (ref >60)
GFR calc non Af Amer: 46 mL/min — ABNORMAL LOW (ref >60)
Glucose, Bld: 65 mg/dL — ABNORMAL LOW (ref 70–99)
Potassium: 4 mmol/L (ref 3.5–5.1)
SODIUM: 143 mmol/L (ref 135–145)

## 2018-01-14 NOTE — Progress Notes (Signed)
Location:   [facility] Room Number: [place] of Service:  SNF ([id number])   CODE STATUS: DNR  Allergies  Allergen Reactions  . Amlodipine Cough  . Clopidogrel Bisulfate Cough  . Kenalog [Triamcinolone Acetonide] Other (See Comments)    unknown  . Lisinopril Cough  . Losartan Cough  . Sulfa Antibiotics Hives and Rash    Chief Complaint  Patient presents with  . Acute Visit    Thrush    HPI:  Staff reports that over the past several days she has developed oral thrush. She is unable to participate in the hpi or ros. Her appetite; is worse; there are no reports of uncontrolled pain.    Past Medical History:  Diagnosis Date  . Abnormal nuclear stress test 06/21/2011   Inferolateral reversible defect;--> cardiac cath & PCI of CxOM, occluded RCA with collaterals;; followup Myoview 11/2012: Low risk. Fixed basal inferior artifact normal EF. No ischemia  . Anxiety   . Asthma   . Bilateral cataracts    Status post stroke or correction  . CAD S/P percutaneous coronary angioplasty 06/2011   s/P PCI to proximal OM1 w/ DES; occluded RCA with bridging and L-R collaterals (medical management)  . Chronic kidney disease (CKD), stage II (mild)    Related to current bladder infections (although diabetes cannot be excluded)  . Chronotropic incompetence with sinus node dysfunction [facility]) October 2013   On CPET test; beta blockers reduced  . Diabetes mellitus type 2, controlled ([place])    On oral medications  . Diverticulitis   . Essential hypertension    Allowing for permissive hypertension to avoid orthostatic hypotension  . GERD (gastroesophageal reflux disease)    On PPI  . Glaucoma   . History of syncope    Per EP - neurocardiogenic & not Bradycardia related (no PPM)  . History of unstable angina 06/13/2011   Jaw pain awakening from sleep -- Myoview --CATH --> PCI  . Hyperlipidemia with target LDL less than 70    HDL at goal, LDL not at goal, borderline  triglycerides. On Crestor 20 mg  . Migraines   . OSA (obstructive sleep apnea)    hx bladder infections  . Osteoarthritis   . PAF (paroxysmal atrial fibrillation) ([place]) 10-11/ 2014   CardioNet Event Monitor: NSR & S Brady -- Rates 50-100; Total A. fib burden 35 hours and 27 minutes. 1296 episodes, longest was 1 hour 29 minutes. Rate ranged from 52-169 beats per minute.  . Seasonal allergies   . Shortness of breath on exertion October 2013   2-D echo: Normal EF>55%, Gr 1DD, mild aortic sclerosis; Evaluated with CPET - peak VO2 97%; Chronotropic Incompetence (submaximal effort)  . Spinal stenosis of lumbar region 11/2011  . Stroke ([place]) 06/2017  . Stroke ([place]) 11/2014  . Tachycardia-bradycardia syndrome ([place]) 11/2012  . Urge incontinence     Past Surgical History:  Procedure Laterality Date  . APPENDECTOMY    . BREAST BIOPSY     both breast  . cataracts     both eyes  . COLONOSCOPY    . CORONARY ANGIOPLASTY WITH STENT PLACEMENT  07/05/2011   Promus Element DES 2.5 mm x 16 mm-post dilated to 2.65 mm CX-Proximal OM1  . Holter Monitor  04/2015   Sinus rhythm with rates 52-149 BPM. Isolated PACs with rare couplets and bigeminy. Multiple short runs of PAT/PSVT. Arrhythmia run 120 bpm for 204 beats. 14% of time was in A. fib/flutter. - Reviewed by  Dr. Caryl Comes. Not thought to be significant enough for her syncope.  Marland Kitchen KNEE ARTHROSCOPY WITH MEDIAL MENISECTOMY Right 06/24/2014   Procedure: RIGHT KNEE ARTHROSCOPY WITH partial lateral MENISECTOMY, abrasion chondroplasty of medial femoral condyle and patella, microfracture technique;  Surgeon: Latanya Maudlin, MD;  Location: [facility] ORS;  Service: Orthopedics;  Laterality: Right;  . LEFT HEART CATHETERIZATION WITH CORONARY ANGIOGRAM N/A 07/06/2011   Procedure: LEFT HEART CATHETERIZATION WITH CORONARY ANGIOGRAM;  Surgeon: Leonie Man, MD;  [facility] CATH LAB: Unstable Angina --> Myoview wiht Inf-Lat Ischemia.  Proximal OM1 lesion --> PCI; 100% mid RCA  with right to right and left to right bridging collaterals from circumflex RPL and LAD the PDA.  Marland Kitchen NM MYOVIEW LTD  October 2014    Low risk. Fixed basal inferior artifact normal EF. No ischemia --> (as compared to pre-PCI Myoview revealing inferolateral ischemia)  . rotator cuff Right   . TRANSTHORACIC ECHOCARDIOGRAM  11/2014   EF 65-70%, Mod LVH. Normal wall motion. Gr 1 DD. Normal valves.  Marland Kitchen VAGINAL HYSTERECTOMY      Social History   Socioeconomic History  . Marital status: Widowed    Spouse name: Not on file  . Number of children: Not on file  . Years of education: Not on file  . Highest education level: Not on file  Occupational History  . Occupation: retired  Scientific laboratory technician  . Financial resource strain: Not hard at all  . Food insecurity:    Worry: Patient refused    Inability: Patient refused  . Transportation needs:    Medical: Patient refused    Non-medical: Patient refused  Tobacco Use  . Smoking status: Never Smoker  . Smokeless tobacco: Never Used  Substance and Sexual Activity  . Alcohol use: No  . Drug use: No  . Sexual activity: Never    Birth control/protection: Post-menopausal  Lifestyle  . Physical activity:    Days per week: Patient refused    Minutes per session: Patient refused  . Stress: Only a little  Relationships  . Social connections:    Talks on phone: Patient refused    Gets together: Patient refused    Attends religious service: Patient refused    Active member of club or organization: Patient refused    Attends meetings of clubs or organizations: Patient refused    Relationship status: Patient refused  . Intimate partner violence:    Fear of current or ex partner: Patient refused    Emotionally abused: Patient refused    Physically abused: Patient refused    Forced sexual activity: Patient refused  Other Topics Concern  . Not on file  Social History Narrative   Widowed mother of one, grandmother 2. Has not been using silver take as  much lately due to knee discomfort. Usually exercises with Silver Sneakers on regular basis.   Family History  Problem Relation Age of Onset  . COPD Mother   . Pulmonary fibrosis Mother   . Heart attack Father   . Heart disease Father   . Stroke Father   . Cancer Sister   . Cancer Sister   . Clotting disorder Sister   . Heart attack Sister   . Arthritis Unknown   . Breast cancer Sister   . Colon cancer Neg Hx   . Esophageal cancer Neg Hx   . Stomach cancer Neg Hx       VITAL SIGNS BP (!) 110/43   Pulse 76   Temp 98.4 F (36.9 C)  Resp 17   Ht 5\' 4"  (1.626 m)   SpO2 98%   BMI 28.32 kg/m   Outpatient Encounter Medications as of 01/14/2018  Medication Sig  . acetaminophen (TYLENOL) 650 MG CR tablet Take 650 mg by mouth every 6 (six) hours as needed for pain.  Marland Kitchen atorvastatin (LIPITOR) 80 MG tablet Take 80 mg by mouth daily at 6 PM.  . azelastine (ASTELIN) 0.1 % nasal spray Place 2 sprays into both nostrils at bedtime. Use in each nostril as directed   . divalproex (DEPAKOTE) 125 MG DR tablet Take 250 mg by mouth at bedtime.  . docusate sodium (STOOL SOFTENER) 100 MG capsule Take 100 mg by mouth daily.   . fish oil-omega-3 fatty acids 1000 MG capsule Take 1 g by mouth daily.   . furosemide (LASIX) 20 MG tablet Take 20 mg by mouth daily.  Marland Kitchen gabapentin (NEURONTIN) 300 MG capsule Take 300 mg by mouth 3 (three) times daily.   Marland Kitchen glucagon (GLUCAGON EMERGENCY) 1 MG injection Inject 1 mg into the vein once as needed. Give 1 mg IM for Hypoglycemia (symptomatic) not responding to oral glucose, snack. May repeat x 1 after 15 minutes. USE ONLY IF PT IS OR IS BECOMING UNRESPONSIVE  . GLUCERNA (GLUCERNA) LIQD Take 237 mLs by mouth 2 (two) times daily between meals.  Marland Kitchen HUMALOG KWIKPEN 100 UNIT/ML KiwkPen Inject 22 Units into the skin 3 (three) times daily with meals. DM2. Hold for CBG < 200  . Infant Care Products [facility] EX) Apply liberal amount topically to area of skin irritation  prn. OK to leave at bedside.  . latanoprost (XALATAN) 0.005 % ophthalmic solution Place 1 drop into both eyes at bedtime.  . levETIRAcetam (KEPPRA) 500 MG tablet Take 1 tablet (500 mg total) by mouth 2 (two) times daily.  Marland Kitchen levocetirizine (XYZAL) 5 MG tablet Take 2.5 mg by mouth daily.   . Lidocaine-Menthol (ICY HOT LIDOCAINE PLUS MENTHOL) 4-1 % PTCH Apply 1 patch topically daily. apply to lower back in the a.m. for pain management  . Magnesium 500 MG CAPS Take 500 mg by mouth at bedtime.   . metFORMIN (GLUCOPHAGE-XR) 500 MG 24 hr tablet Take 500 mg by mouth daily with breakfast.   . metoprolol tartrate (LOPRESSOR) 25 MG tablet Take 1 tablet (25 mg total) by mouth 2 (two) times daily.  . mirtazapine (REMERON SOL-TAB) 15 MG disintegrating tablet Take 0.5 tablets (7.5 mg total) by mouth at bedtime.  . nitroGLYCERIN (NITROSTAT) 0.4 MG SL tablet Place 0.4 mg under the tongue every 5 (five) minutes as needed for chest pain.  . NON FORMULARY Diet Order: Upgrade patient to dysphagia 2 consistencies (ground), continue thin liquids; Straws ok  . omeprazole (PRILOSEC) 40 MG capsule Take 40 mg by mouth daily.  Marland Kitchen oxybutynin (DITROPAN) 5 MG tablet Take 5 mg by mouth 2 (two) times daily.  . OXYGEN Inhale 2 L/min into the lungs 2 (two) times daily as needed.  Marland Kitchen PARoxetine (PAXIL) 20 MG tablet Take 1 tablet (20 mg total) by mouth daily.  . polyethylene glycol (MIRALAX / GLYCOLAX) packet Take 17 g by mouth daily as needed for moderate constipation.  . Rivaroxaban (XARELTO) 15 MG TABS tablet Take 15 mg by mouth every evening.   Marland Kitchen rOPINIRole (REQUIP) 0.5 MG tablet TAKE 1 TABLET BY MOUTH AT  BEDTIME  . timolol (TIMOPTIC) 0.5 % ophthalmic solution Place 1 drop into both eyes at bedtime.   . traMADol (ULTRAM) 50 MG tablet Take 1 tablet (  50 mg total) by mouth 3 (three) times daily. At 6 am 12 pm and 10 pm  . trolamine salicylate (ASPERCREME) 10 % cream Apply 1 application topically 3 (three) times daily. Apply to right  knee  . Insulin Glargine (TOUJEO MAX SOLOSTAR) 300 UNIT/ML SOPN Inject 75 Units into the skin at bedtime. Alternate abdominal injection sites.   No facility-administered encounter medications on file as of 01/14/2018.      SIGNIFICANT DIAGNOSTIC EXAMS   PREVIOUS;  12-21-17: ct of head:  1. No acute intracranial abnormality identified. 2. ASPECTS is 10. 3. Stable chronic microvascular ischemic changes, volume loss, and small chronic infarcts of the basal ganglia. 4. Chronic infarct of the left lentiform nucleus, caudate body, and corona radiata corresponding to the prior acute infarction on 07/22/2017 MRI of the brain. 5. Sphenoid sinus opacification with fluid level, progressed from prior study, compatible with acute sinusitis in the appropriate clinical setting.  12-21-17: MRI of brain: 1. Motion degraded examination.  No acute intracranial process. 2. Multiple old small supra- and infratentorial infarcts. 3. Moderate to severe parenchymal brain volume loss.  12-22-17: chest x-ray: No active disease.  NO NEW EXAMS.     LABS REVIEWED: PREVIOUS  07-21-17: wbc 10.1; hgb 13.8; hct 43.8; mcv 94.5; plt 323 glucose 221; bun 18; creat 1.21; k+ 4.2; na++ 136; ca 9.7 liver normal albumin 3.6  07-22-17: hgb a1c 7.8 tsh 4.194 vit B 12: 342 07-24-17: wbc 10.5; hgb 12.9; hct 39.5; mcv 93.8 plt 331; glucose 121 bun 18; creat 1.13;  4.1; na++ 144; ca 9.4 chol 234; ldl 157; trig 218; hdl 33  10-09-17: urine micro-albumin: 5.9 10-25-17: mag 2.1  10-31-17: wbc 10.2; hgb 11.1; hct 32.5 ;mcv 93.8; plt 309 glucose 170; bun 19; creat 1.02; k+ 4.1; na++138; ca 9.1; liver normal albumin 3.2 urine culture: multiple species  11-08-17: urine culture: e-coli  12-21-17: wbc 10.4; hgb 11.5; hct 36.8; mcv 100.3; plt 317; glucose 87; bun 14; creat 1.22; k+ 3.9; na++ 140; ca 9.7; liver normal albumin 3.1  12-22-17: tsh 3.223 12-24-17: wbc 13.6; hgb 12.3; hct 37.6; mcv 95.9; plt 371; glucose 101; bun 7; creat 0.85; k+  3.3; na++ 140; ca 9.6  12-16-17: wbc 12.0; gb 12.4; hct 38.5; mcv 98.0; plt 342; glucose 194; bun 14; creat 0.93; k+ 3.7; na++ 142; ca 9.5 01-03-18: wbc  9.7; hgb 11.6; hct 36.0; mcv 98.9 plt 327; glucose 90; bun 18; creat 1.05; k+ 3.4; na++ 144; ca 9.4  NO NEW LABS   Review of Systems  Reason unable to perform ROS: poor historian      Physical Exam  Constitutional: She appears well-developed and well-nourished. No distress.  HENT:  Has oral thrush with a white tongue   Neck: Thyromegaly present.  Mild thyroid enlargement present   Cardiovascular: Normal rate, regular rhythm, normal heart sounds and intact distal pulses.  Pulmonary/Chest: Effort normal and breath sounds normal. No respiratory distress.  Abdominal: Soft. Bowel sounds are normal. She exhibits no distension. There is no tenderness.  Musculoskeletal: She exhibits no edema.  Has mild generalized weakness present Is able to move all extremities     Lymphadenopathy:    She has no cervical adenopathy.  Neurological: She is alert.  Skin: Skin is warm and dry. She is not diaphoretic.  Psychiatric: She has a normal mood and affect.     ASSESSMENT/ PLAN:  TODAY:   1. Oral thrush: will begin diflucan 100 mg daily through 01-18-18.   MD is aware  of resident's narcotic use and is in agreement with current plan of care. We will attempt to wean resident as apropriate   Ok Edwards NP [facility]  Contact [phone] Monday through Friday 8am- 5pm  After hours call [phone]

## 2018-01-15 DIAGNOSIS — I69351 Hemiplegia and hemiparesis following cerebral infarction affecting right dominant side: Secondary | ICD-10-CM | POA: Diagnosis not present

## 2018-01-15 DIAGNOSIS — K219 Gastro-esophageal reflux disease without esophagitis: Secondary | ICD-10-CM | POA: Diagnosis not present

## 2018-01-15 DIAGNOSIS — I69320 Aphasia following cerebral infarction: Secondary | ICD-10-CM | POA: Diagnosis not present

## 2018-01-16 ENCOUNTER — Encounter: Payer: Self-pay | Admitting: Adult Health

## 2018-01-16 ENCOUNTER — Non-Acute Institutional Stay (SKILLED_NURSING_FACILITY): Payer: Medicare Other | Admitting: Adult Health

## 2018-01-16 DIAGNOSIS — E1149 Type 2 diabetes mellitus with other diabetic neurological complication: Secondary | ICD-10-CM

## 2018-01-16 DIAGNOSIS — I639 Cerebral infarction, unspecified: Secondary | ICD-10-CM

## 2018-01-16 DIAGNOSIS — R400 Somnolence: Secondary | ICD-10-CM

## 2018-01-16 DIAGNOSIS — I5032 Chronic diastolic (congestive) heart failure: Secondary | ICD-10-CM

## 2018-01-16 DIAGNOSIS — I69320 Aphasia following cerebral infarction: Secondary | ICD-10-CM | POA: Diagnosis not present

## 2018-01-16 DIAGNOSIS — K219 Gastro-esophageal reflux disease without esophagitis: Secondary | ICD-10-CM | POA: Diagnosis not present

## 2018-01-16 DIAGNOSIS — I69351 Hemiplegia and hemiparesis following cerebral infarction affecting right dominant side: Secondary | ICD-10-CM | POA: Diagnosis not present

## 2018-01-16 NOTE — Progress Notes (Signed)
Location:   [facility] Room Number: [place] of Service:  SNF (31)   CODE STATUS: DNR  Allergies  Allergen Reactions  . Amlodipine Cough  . Clopidogrel Bisulfate Cough  . Kenalog [Triamcinolone Acetonide] Other (See Comments)    unknown  . Lisinopril Cough  . Losartan Cough  . Sulfa Antibiotics Hives and Rash    Chief Complaint  Patient presents with  . Acute Visit    Care Plan Meeting    HPI:  We have come together for her care plan meeting. She is unable to participate; there is family present. She continues to seen by therapy. She is more lethargic; is unable to stay awake. She will wake up easily; but falls to sleep right away. She has lost weight from 162 pounds to 141 pounds. There are no reports of uncontrolled pain. Her family is concerned about her increased somnolence. We have discussed her medications and will make changes to help with appetite and to increase alertness.  We have discussed her advanced directives. She is a DNR. We have discussed and filled out her MOST form with no hospitalization and no tube feeding. Her daughter has verbalized understanding.   Past Medical History:  Diagnosis Date  . Abnormal nuclear stress test 06/21/2011   Inferolateral reversible defect;--> cardiac cath & PCI of CxOM, occluded RCA with collaterals;; followup Myoview 11/2012: Low risk. Fixed basal inferior artifact normal EF. No ischemia  . Anxiety   . Asthma   . Bilateral cataracts    Status post stroke or correction  . CAD S/P percutaneous coronary angioplasty 06/2011   s/P PCI to proximal OM1 w/ DES; occluded RCA with bridging and L-R collaterals (medical management)  . Chronic kidney disease (CKD), stage II (mild)    Related to current bladder infections (although diabetes cannot be excluded)  . Chronotropic incompetence with sinus node dysfunction [facility]) October 2013   On CPET test; beta blockers reduced  . Diabetes mellitus type 2,  controlled ([place])    On oral medications  . Diverticulitis   . Essential hypertension    Allowing for permissive hypertension to avoid orthostatic hypotension  . GERD (gastroesophageal reflux disease)    On PPI  . Glaucoma   . History of syncope    Per EP - neurocardiogenic & not Bradycardia related (no PPM)  . History of unstable angina 06/13/2011   Jaw pain awakening from sleep -- Myoview --CATH --> PCI  . Hyperlipidemia with target LDL less than 70    HDL at goal, LDL not at goal, borderline triglycerides. On Crestor 20 mg  . Migraines   . OSA (obstructive sleep apnea)    hx bladder infections  . Osteoarthritis   . PAF (paroxysmal atrial fibrillation) ([place]) 10-11/ 2014   CardioNet Event Monitor: NSR & S [place] -- Rates 50-100; Total A. fib burden 35 hours and 27 minutes. 1296 episodes, longest was 1 hour 29 minutes. Rate ranged from 52-169 beats per minute.  . Seasonal allergies   . Shortness of breath on exertion October 2013   2-D echo: Normal EF>55%, Gr 1DD, mild aortic sclerosis; Evaluated with CPET - peak VO2 97%; Chronotropic Incompetence (submaximal effort)  . Spinal stenosis of lumbar region 11/2011  . Stroke ([place]) 06/2017  . Stroke ([place]) 11/2014  . Tachycardia-bradycardia syndrome ([place]) 11/2012  . Urge incontinence     Past Surgical History:  Procedure Laterality Date  . APPENDECTOMY    . BREAST BIOPSY  both breast  . cataracts     both eyes  . COLONOSCOPY    . CORONARY ANGIOPLASTY WITH STENT PLACEMENT  07/05/2011   Promus Element DES 2.5 mm x 16 mm-post dilated to 2.65 mm CX-Proximal OM1  . Holter Monitor  04/2015   Sinus rhythm with rates 52-149 BPM. Isolated PACs with rare couplets and bigeminy. Multiple short runs of PAT/PSVT. Arrhythmia run 120 bpm for 204 beats. 14% of time was in A. fib/flutter. - Reviewed by Dr. Caryl Comes. Not thought to be significant enough for her syncope.  Marland Kitchen KNEE ARTHROSCOPY WITH MEDIAL MENISECTOMY Right 06/24/2014   Procedure: RIGHT KNEE  ARTHROSCOPY WITH partial lateral MENISECTOMY, abrasion chondroplasty of medial femoral condyle and patella, microfracture technique;  Surgeon: Latanya Maudlin, MD;  Location: [facility];  Service: Orthopedics;  Laterality: Right;  . LEFT HEART CATHETERIZATION WITH CORONARY ANGIOGRAM N/A 07/06/2011   Procedure: LEFT HEART CATHETERIZATION WITH CORONARY ANGIOGRAM;  Surgeon: Leonie Man, MD;  Location: [facility] CATH LAB: Unstable Angina --> Myoview wiht Inf-Lat Ischemia.  Proximal OM1 lesion --> PCI; 100% mid RCA with right to right and left to right bridging collaterals from circumflex RPL and LAD the PDA.  Marland Kitchen NM MYOVIEW LTD  October 2014    Low risk. Fixed basal inferior artifact normal EF. No ischemia --> (as compared to pre-PCI Myoview revealing inferolateral ischemia)  . rotator cuff Right   . TRANSTHORACIC ECHOCARDIOGRAM  11/2014   EF 65-70%, Mod LVH. Normal wall motion. Gr 1 DD. Normal valves.  Marland Kitchen VAGINAL HYSTERECTOMY      Social History   Socioeconomic History  . Marital status: Widowed    Spouse name: Not on file  . Number of children: Not on file  . Years of education: Not on file  . Highest education level: Not on file  Occupational History  . Occupation: retired  Scientific laboratory technician  . Financial resource strain: Not hard at all  . Food insecurity:    Worry: Patient refused    Inability: Patient refused  . Transportation needs:    Medical: Patient refused    Non-medical: Patient refused  Tobacco Use  . Smoking status: Never Smoker  . Smokeless tobacco: Never Used  Substance and Sexual Activity  . Alcohol use: No  . Drug use: No  . Sexual activity: Never    Birth control/protection: Post-menopausal  Lifestyle  . Physical activity:    Days per week: Patient refused    Minutes per session: Patient refused  . Stress: Only a little  Relationships  . Social connections:    Talks on phone: Patient refused    Gets together: Patient refused    Attends religious service: Patient refused     Active member of club or organization: Patient refused    Attends meetings of clubs or organizations: Patient refused    Relationship status: Patient refused  . Intimate partner violence:    Fear of current or ex partner: Patient refused    Emotionally abused: Patient refused    Physically abused: Patient refused    Forced sexual activity: Patient refused  Other Topics Concern  . Not on file  Social History Narrative   Widowed mother of one, grandmother 2. Has not been using silver take as much lately due to knee discomfort. Usually exercises with Silver Sneakers on regular basis.   Family History  Problem Relation Age of Onset  . COPD Mother   . Pulmonary fibrosis Mother   . Heart attack Father   . Heart  disease Father   . Stroke Father   . Cancer Sister   . Cancer Sister   . Clotting disorder Sister   . Heart attack Sister   . Arthritis Unknown   . Breast cancer Sister   . Colon cancer Neg Hx   . Esophageal cancer Neg Hx   . Stomach cancer Neg Hx       VITAL SIGNS BP (!) 110/43   Pulse 82   Temp 98.4 F (36.9 C)   Resp 17   Ht 5\' 4"  (1.626 m)   Wt 141 lb 14.4 oz (64.4 kg)   SpO2 98%   BMI 24.36 kg/m   Outpatient Encounter Medications as of 01/16/2018  Medication Sig  . acetaminophen (TYLENOL) 650 MG CR tablet Take 650 mg by mouth every 6 (six) hours as needed for pain.  Marland Kitchen atorvastatin (LIPITOR) 80 MG tablet Take 80 mg by mouth daily at 6 PM.  . azelastine (ASTELIN) 0.1 % nasal spray Place 2 sprays into both nostrils at bedtime. Use in each nostril as directed   . divalproex (DEPAKOTE) 125 MG DR tablet Take 250 mg by mouth at bedtime.  . docusate sodium (STOOL SOFTENER) 100 MG capsule Take 100 mg by mouth daily.   . fish oil-omega-3 fatty acids 1000 MG capsule Take 1 g by mouth daily.   . fluconazole (DIFLUCAN) 100 MG tablet Take 100 mg by mouth daily.  Marland Kitchen gabapentin (NEURONTIN) 300 MG capsule Take 300 mg by mouth 3 (three) times daily.   Marland Kitchen glucagon (GLUCAGON  EMERGENCY) 1 MG injection Inject 1 mg into the vein once as needed. Give 1 mg IM for Hypoglycemia (symptomatic) not responding to oral glucose, snack. May repeat x 1 after 15 minutes. USE ONLY IF PT IS OR IS BECOMING UNRESPONSIVE  . GLUCERNA (GLUCERNA) LIQD Take 237 mLs by mouth 2 (two) times daily between meals.  Marland Kitchen HUMALOG KWIKPEN 100 UNIT/ML KiwkPen Inject 22 Units into the skin 3 (three) times daily with meals. DM2. Hold for CBG < 200  . Infant Care Products [facility] EX) Apply liberal amount topically to area of skin irritation prn. OK to leave at bedside.  . Insulin Glargine (TOUJEO MAX SOLOSTAR) 300 UNIT/ML SOPN Inject 75 Units into the skin at bedtime. Alternate abdominal injection sites.  . latanoprost (XALATAN) 0.005 % ophthalmic solution Place 1 drop into both eyes at bedtime.  . levETIRAcetam (KEPPRA) 500 MG tablet Take 1 tablet (500 mg total) by mouth 2 (two) times daily.  Marland Kitchen levocetirizine (XYZAL) 5 MG tablet Take 2.5 mg by mouth daily.   . Lidocaine-Menthol (ICY HOT LIDOCAINE PLUS MENTHOL) 4-1 % PTCH Apply 1 patch topically daily. apply to lower back in the a.m. for pain management  . Magnesium 500 MG CAPS Take 500 mg by mouth at bedtime.   . metFORMIN (GLUCOPHAGE-XR) 500 MG 24 hr tablet Take 500 mg by mouth daily with breakfast.   . metoprolol tartrate (LOPRESSOR) 25 MG tablet Take 1 tablet (25 mg total) by mouth 2 (two) times daily.  . mirtazapine (REMERON SOL-TAB) 15 MG disintegrating tablet Take 0.5 tablets (7.5 mg total) by mouth at bedtime.  . nitroGLYCERIN (NITROSTAT) 0.4 MG SL tablet Place 0.4 mg under the tongue every 5 (five) minutes as needed for chest pain.  . NON FORMULARY Diet Order: Upgrade patient to dysphagia 2 consistencies (ground), continue thin liquids; Straws ok  . omeprazole (PRILOSEC) 40 MG capsule Take 40 mg by mouth daily.  Marland Kitchen oxybutynin (DITROPAN) 5 MG tablet Take  5 mg by mouth 2 (two) times daily.  . OXYGEN Inhale 2 L/min into the lungs 2 (two) times daily  as needed.  Marland Kitchen PARoxetine (PAXIL) 20 MG tablet Take 1 tablet (20 mg total) by mouth daily.  . polyethylene glycol (MIRALAX / GLYCOLAX) packet Take 17 g by mouth daily as needed for moderate constipation.  . Rivaroxaban (XARELTO) 15 MG TABS tablet Take 15 mg by mouth every evening.   Marland Kitchen rOPINIRole (REQUIP) 0.5 MG tablet TAKE 1 TABLET BY MOUTH AT  BEDTIME  . timolol (TIMOPTIC) 0.5 % ophthalmic solution Place 1 drop into both eyes at bedtime.   . traMADol (ULTRAM) 50 MG tablet Take 1 tablet (50 mg total) by mouth 3 (three) times daily. At 6 am 12 pm and 10 pm  . trolamine salicylate (ASPERCREME) 10 % cream Apply 1 application topically 3 (three) times daily. Apply to right knee  . furosemide (LASIX) 20 MG tablet Take 20 mg by mouth daily.   No facility-administered encounter medications on file as of 01/16/2018.      SIGNIFICANT DIAGNOSTIC EXAMS    PREVIOUS;  12-21-17: ct of head:  1. No acute intracranial abnormality identified. 2. ASPECTS is 10. 3. Stable chronic microvascular ischemic changes, volume loss, and small chronic infarcts of the basal ganglia. 4. Chronic infarct of the left lentiform nucleus, caudate body, and corona radiata corresponding to the prior acute infarction on 07/22/2017 MRI of the brain. 5. Sphenoid sinus opacification with fluid level, progressed from prior study, compatible with acute sinusitis in the appropriate clinical setting.  12-21-17: MRI of brain: 1. Motion degraded examination.  No acute intracranial process. 2. Multiple old small supra- and infratentorial infarcts. 3. Moderate to severe parenchymal brain volume loss.  12-22-17: chest x-ray: No active disease.  NO NEW EXAMS.     LABS REVIEWED: PREVIOUS  07-21-17: wbc 10.1; hgb 13.8; hct 43.8; mcv 94.5; plt 323 glucose 221; bun 18; creat 1.21; k+ 4.2; na++ 136; ca 9.7 liver normal albumin 3.6  07-22-17: hgb a1c 7.8 tsh 4.194 vit B 12: 342 07-24-17: wbc 10.5; hgb 12.9; hct 39.5; mcv 93.8 plt 331;  glucose 121 bun 18; creat 1.13;  4.1; na++ 144; ca 9.4 chol 234; ldl 157; trig 218; hdl 33  10-09-17: urine micro-albumin: 5.9 10-25-17: mag 2.1  10-31-17: wbc 10.2; hgb 11.1; hct 32.5 ;mcv 93.8; plt 309 glucose 170; bun 19; creat 1.02; k+ 4.1; na++138; ca 9.1; liver normal albumin 3.2 urine culture: multiple species  11-08-17: urine culture: e-coli  12-21-17: wbc 10.4; hgb 11.5; hct 36.8; mcv 100.3; plt 317; glucose 87; bun 14; creat 1.22; k+ 3.9; na++ 140; ca 9.7; liver normal albumin 3.1  12-22-17: tsh 3.223 12-24-17: wbc 13.6; hgb 12.3; hct 37.6; mcv 95.9; plt 371; glucose 101; bun 7; creat 0.85; k+ 3.3; na++ 140; ca 9.6  12-16-17: wbc 12.0; gb 12.4; hct 38.5; mcv 98.0; plt 342; glucose 194; bun 14; creat 0.93; k+ 3.7; na++ 142; ca 9.5 01-03-18: wbc  9.7; hgb 11.6; hct 36.0; mcv 98.9 plt 327; glucose 90; bun 18; creat 1.05; k+ 3.4; na++ 144; ca 9.4  TODAY;   01-14-18; glucose 65; bun 22; creat 1.06; k+ 4.0; na++ 143; ca 9.0     Review of Systems  Unable to perform ROS: Other (sommolence )    Physical Exam  Constitutional: She appears well-developed and well-nourished. No distress.  Neck: Thyromegaly present.  Mild thyroid enlargement   Cardiovascular: Normal rate, regular rhythm, normal heart sounds and intact distal  pulses.  Pulmonary/Chest: Effort normal and breath sounds normal. No respiratory distress.  Abdominal: Soft. Bowel sounds are normal. She exhibits no distension. There is no tenderness.  Musculoskeletal: She exhibits no edema.  Has mild generalized weakness present Is able to move all extremities      Lymphadenopathy:    She has no cervical adenopathy.  Neurological: She is alert.  Skin: Skin is warm and dry. She is not diaphoretic.  Psychiatric: She has a normal mood and affect.    ASSESSMENT/ PLAN:  TODAY:   1. Acute CVA 2. Chronic diastolic CHF 3. Diabetes mellitus type 2 with neurological manifestations 4. somnolence   Will stop metformin  Will lower  depakote and then stop Will lower ultram to 50 mg twice daily  Will lower paxil to 10 mg for one week then Will begin prozac 10 mg daily for one week then increase to 20 mg daily   MOST form filled out: DNR: no hospitalizations; no tube feeding.    Time spent with patient and family: 50 minutes (time with advanced directives 40 minutes) discussed medications; therapy and advanced directives. Verbalized understanding.    MD is aware of resident's narcotic use and is in agreement with current plan of care. We will attempt to wean resident as apropriate   Ok Edwards NP [facility]  Contact [phone] Monday through Friday 8am- 5pm  After hours call [phone]

## 2018-01-17 ENCOUNTER — Other Ambulatory Visit
Admission: RE | Admit: 2018-01-17 | Discharge: 2018-01-17 | Disposition: A | Discharge status: Home / Self Care | Payer: Medicare Other | Source: Ambulatory Visit | Attending: Internal Medicine | Admitting: Internal Medicine

## 2018-01-17 DIAGNOSIS — I130 Hypertensive heart and chronic kidney disease with heart failure and stage 1 through stage 4 chronic kidney disease, or unspecified chronic kidney disease: Secondary | ICD-10-CM | POA: Insufficient documentation

## 2018-01-17 DIAGNOSIS — I69351 Hemiplegia and hemiparesis following cerebral infarction affecting right dominant side: Secondary | ICD-10-CM | POA: Diagnosis not present

## 2018-01-17 DIAGNOSIS — K219 Gastro-esophageal reflux disease without esophagitis: Secondary | ICD-10-CM | POA: Diagnosis not present

## 2018-01-17 DIAGNOSIS — I69320 Aphasia following cerebral infarction: Secondary | ICD-10-CM | POA: Diagnosis not present

## 2018-01-17 LAB — VITAMIN B12: Vitamin B-12: 321 pg/mL (ref 180–914)

## 2018-01-18 ENCOUNTER — Non-Acute Institutional Stay (SKILLED_NURSING_FACILITY): Payer: Medicare Other | Admitting: Adult Health

## 2018-01-18 ENCOUNTER — Encounter: Payer: Self-pay | Admitting: Adult Health

## 2018-01-18 DIAGNOSIS — E1169 Type 2 diabetes mellitus with other specified complication: Secondary | ICD-10-CM | POA: Diagnosis not present

## 2018-01-18 DIAGNOSIS — E1149 Type 2 diabetes mellitus with other diabetic neurological complication: Secondary | ICD-10-CM | POA: Diagnosis not present

## 2018-01-18 DIAGNOSIS — I2510 Atherosclerotic heart disease of native coronary artery without angina pectoris: Secondary | ICD-10-CM | POA: Diagnosis not present

## 2018-01-18 DIAGNOSIS — I639 Cerebral infarction, unspecified: Secondary | ICD-10-CM

## 2018-01-18 DIAGNOSIS — Z9861 Coronary angioplasty status: Secondary | ICD-10-CM

## 2018-01-18 DIAGNOSIS — I69320 Aphasia following cerebral infarction: Secondary | ICD-10-CM | POA: Diagnosis not present

## 2018-01-18 DIAGNOSIS — K219 Gastro-esophageal reflux disease without esophagitis: Secondary | ICD-10-CM | POA: Diagnosis not present

## 2018-01-18 DIAGNOSIS — E785 Hyperlipidemia, unspecified: Secondary | ICD-10-CM

## 2018-01-18 DIAGNOSIS — I69351 Hemiplegia and hemiparesis following cerebral infarction affecting right dominant side: Secondary | ICD-10-CM | POA: Diagnosis not present

## 2018-01-18 LAB — VITAMIN D 25 HYDROXY (VIT D DEFICIENCY, FRACTURES): Vit D, 25-Hydroxy: 38.1 ng/mL (ref 30.0–100.0)

## 2018-01-18 NOTE — Progress Notes (Signed)
Location:   [facility] Room Number: [place] of Service:  SNF (31)   CODE STATUS: DNR  Allergies  Allergen Reactions  . Amlodipine Cough  . Clopidogrel Bisulfate Cough  . Kenalog [Triamcinolone Acetonide] Other (See Comments)    unknown  . Lisinopril Cough  . Losartan Cough  . Sulfa Antibiotics Hives and Rash    Chief Complaint  Patient presents with  . Medical Management of Chronic Issues    CAD-PCI OMi, 100% RCA (L-R collaterals); dyslipidemia associated with type 2 diabetes; diabetes type 2 with neurological manifestations; acute CVA. Weekly follow up for the first 30 days post hospitalization.     HPI:  She is a 84 year old long term resident of this facility being seen for the management of her chronic illnesses: CAD; dyslipidemia; diabetes cva. Her medications are presently being adjusted to help with her somnolence and depression. She is unable to participate in the hpi or ros. There are no reports of uncontrolled pain; no changes in appetite; no further indications of anxiety.    Past Medical History:  Diagnosis Date  . Abnormal nuclear stress test 06/21/2011   Inferolateral reversible defect;--> cardiac cath & PCI of CxOM, occluded RCA with collaterals;; followup Myoview 11/2012: Low risk. Fixed basal inferior artifact normal EF. No ischemia  . Anxiety   . Asthma   . Bilateral cataracts    Status post stroke or correction  . CAD S/P percutaneous coronary angioplasty 06/2011   s/P PCI to proximal OM1 w/ DES; occluded RCA with bridging and L-R collaterals (medical management)  . Chronic kidney disease (CKD), stage II (mild)    Related to current bladder infections (although diabetes cannot be excluded)  . Chronotropic incompetence with sinus node dysfunction [facility]) October 2013   On CPET test; beta blockers reduced  . Diabetes mellitus type 2, controlled ([place])    On oral medications  . Diverticulitis   . Essential hypertension    Allowing for permissive hypertension to avoid orthostatic hypotension  . GERD (gastroesophageal reflux disease)    On PPI  . Glaucoma   . History of syncope    Per EP - neurocardiogenic & not Bradycardia related (no PPM)  . History of unstable angina 06/13/2011   Jaw pain awakening from sleep -- Myoview --CATH --> PCI  . Hyperlipidemia with target LDL less than 70    HDL at goal, LDL not at goal, borderline triglycerides. On Crestor 20 mg  . Migraines   . OSA (obstructive sleep apnea)    hx bladder infections  . Osteoarthritis   . PAF (paroxysmal atrial fibrillation) ([place]) 10-11/ 2014   CardioNet Event Monitor: NSR & S Brady -- Rates 50-100; Total A. fib burden 35 hours and 27 minutes. 1296 episodes, longest was 1 hour 29 minutes. Rate ranged from 52-169 beats per minute.  . Seasonal allergies   . Shortness of breath on exertion October 2013   2-D echo: Normal EF>55%, Gr 1DD, mild aortic sclerosis; Evaluated with CPET - peak VO2 97%; Chronotropic Incompetence (submaximal effort)  . Spinal stenosis of lumbar region 11/2011  . Stroke ([place]) 06/2017  . Stroke ([place]) 11/2014  . Tachycardia-bradycardia syndrome ([place]) 11/2012  . Urge incontinence     Past Surgical History:  Procedure Laterality Date  . APPENDECTOMY    . BREAST BIOPSY     both breast  . cataracts     both eyes  . COLONOSCOPY    . CORONARY ANGIOPLASTY WITH STENT  PLACEMENT  07/05/2011   Promus Element DES 2.5 mm x 16 mm-post dilated to 2.65 mm CX-Proximal OM1  . Holter Monitor  04/2015   Sinus rhythm with rates 52-149 BPM. Isolated PACs with rare couplets and bigeminy. Multiple short runs of PAT/PSVT. Arrhythmia run 120 bpm for 204 beats. 14% of time was in A. fib/flutter. - Reviewed by Dr. Caryl Comes. Not thought to be significant enough for her syncope.  Marland Kitchen KNEE ARTHROSCOPY WITH MEDIAL MENISECTOMY Right 06/24/2014   Procedure: RIGHT KNEE ARTHROSCOPY WITH partial lateral MENISECTOMY, abrasion chondroplasty of medial femoral  condyle and patella, microfracture technique;  Surgeon: Latanya Maudlin, MD;  Location: [facility];  Service: Orthopedics;  Laterality: Right;  . LEFT HEART CATHETERIZATION WITH CORONARY ANGIOGRAM N/A 07/06/2011   Procedure: LEFT HEART CATHETERIZATION WITH CORONARY ANGIOGRAM;  Surgeon: Leonie Man, MD;  Location: [facility] CATH LAB: Unstable Angina --> Myoview wiht Inf-Lat Ischemia.  Proximal OM1 lesion --> PCI; 100% mid RCA with right to right and left to right bridging collaterals from circumflex RPL and LAD the PDA.  Marland Kitchen NM MYOVIEW LTD  October 2014    Low risk. Fixed basal inferior artifact normal EF. No ischemia --> (as compared to pre-PCI Myoview revealing inferolateral ischemia)  . rotator cuff Right   . TRANSTHORACIC ECHOCARDIOGRAM  11/2014   EF 65-70%, Mod LVH. Normal wall motion. Gr 1 DD. Normal valves.  Marland Kitchen VAGINAL HYSTERECTOMY      Social History   Socioeconomic History  . Marital status: Widowed    Spouse name: Not on file  . Number of children: Not on file  . Years of education: Not on file  . Highest education level: Not on file  Occupational History  . Occupation: retired  Scientific laboratory technician  . Financial resource strain: Not hard at all  . Food insecurity:    Worry: Patient refused    Inability: Patient refused  . Transportation needs:    Medical: Patient refused    Non-medical: Patient refused  Tobacco Use  . Smoking status: Never Smoker  . Smokeless tobacco: Never Used  Substance and Sexual Activity  . Alcohol use: No  . Drug use: No  . Sexual activity: Never    Birth control/protection: Post-menopausal  Lifestyle  . Physical activity:    Days per week: Patient refused    Minutes per session: Patient refused  . Stress: Only a little  Relationships  . Social connections:    Talks on phone: Patient refused    Gets together: Patient refused    Attends religious service: Patient refused    Active member of club or organization: Patient refused    Attends meetings of clubs or  organizations: Patient refused    Relationship status: Patient refused  . Intimate partner violence:    Fear of current or ex partner: Patient refused    Emotionally abused: Patient refused    Physically abused: Patient refused    Forced sexual activity: Patient refused  Other Topics Concern  . Not on file  Social History Narrative   Widowed mother of one, grandmother 2. Has not been using silver take as much lately due to knee discomfort. Usually exercises with Silver Sneakers on regular basis.   Family History  Problem Relation Age of Onset  . COPD Mother   . Pulmonary fibrosis Mother   . Heart attack Father   . Heart disease Father   . Stroke Father   . Cancer Sister   . Cancer Sister   . Clotting disorder  Sister   . Heart attack Sister   . Arthritis Unknown   . Breast cancer Sister   . Colon cancer Neg Hx   . Esophageal cancer Neg Hx   . Stomach cancer Neg Hx       VITAL SIGNS BP (!) 110/43   Pulse 87   Temp 98.4 F (36.9 C)   Resp 17   Ht 5\' 4"  (1.626 m)   Wt 141 lb 14.4 oz (64.4 kg)   SpO2 96%   BMI 24.36 kg/m   Outpatient Encounter Medications as of 01/18/2018  Medication Sig  . acetaminophen (TYLENOL) 650 MG CR tablet Take 650 mg by mouth every 6 (six) hours as needed for pain.  Marland Kitchen azelastine (ASTELIN) 0.1 % nasal spray Place 2 sprays into both nostrils at bedtime. Use in each nostril as directed   . divalproex (DEPAKOTE) 125 MG DR tablet Take 125 mg by mouth at bedtime.   . docusate sodium (STOOL SOFTENER) 100 MG capsule Take 100 mg by mouth daily.   . fluconazole (DIFLUCAN) 100 MG tablet Take 100 mg by mouth daily.  Marland Kitchen FLUoxetine (PROZAC) 10 MG tablet Take 10 mg by mouth daily. Give 10 mg by  Mouth daily 01/17/17 - 01/24/18, then increase to 20 mg by mouth daily  . gabapentin (NEURONTIN) 300 MG capsule Take 300 mg by mouth 3 (three) times daily.   Marland Kitchen glucagon (GLUCAGON EMERGENCY) 1 MG injection Inject 1 mg into the vein once as needed. Give 1 mg IM for  Hypoglycemia (symptomatic) not responding to oral glucose, snack. May repeat x 1 after 15 minutes. USE ONLY IF PT IS OR IS BECOMING UNRESPONSIVE  . GLUCERNA (GLUCERNA) LIQD Take 237 mLs by mouth 2 (two) times daily between meals.  Marland Kitchen HUMALOG KWIKPEN 100 UNIT/ML KiwkPen Inject 22 Units into the skin 3 (three) times daily with meals. DM2. Hold for CBG < 200  . Infant Care Products [facility] EX) Apply liberal amount topically to area of skin irritation prn. Abigail to leave at bedside.  . Insulin Glargine (TOUJEO MAX SOLOSTAR) 300 UNIT/ML SOPN Inject 50 Units into the skin at bedtime. Alternate abdominal injection sites.  . latanoprost (XALATAN) 0.005 % ophthalmic solution Place 1 drop into both eyes at bedtime.  . levETIRAcetam (KEPPRA) 500 MG tablet Take 1 tablet (500 mg total) by mouth 2 (two) times daily.  Marland Kitchen levocetirizine (XYZAL) 5 MG tablet Take 2.5 mg by mouth daily.   . Lidocaine-Menthol (ICY HOT LIDOCAINE PLUS MENTHOL) 4-1 % PTCH Apply 1 patch topically daily. apply to lower back in the a.m. for pain management  . Magnesium 500 MG CAPS Take 500 mg by mouth at bedtime.   . metoprolol tartrate (LOPRESSOR) 25 MG tablet Take 1 tablet (25 mg total) by mouth 2 (two) times daily.  . mirtazapine (REMERON SOL-TAB) 15 MG disintegrating tablet Take 0.5 tablets (7.5 mg total) by mouth at bedtime.  . nitroGLYCERIN (NITROSTAT) 0.4 MG SL tablet Place 0.4 mg under the tongue every 5 (five) minutes as needed for chest pain.  . NON FORMULARY Diet Order: Upgrade patient to dysphagia 2 consistencies (ground), continue thin liquids; Straws Abigail  . omeprazole (PRILOSEC) 40 MG capsule Take 40 mg by mouth daily.  Marland Kitchen oxybutynin (DITROPAN) 5 MG tablet Take 5 mg by mouth 2 (two) times daily.  . OXYGEN Inhale 2 L/min into the lungs 2 (two) times daily as needed.  Marland Kitchen PARoxetine (PAXIL) 20 MG tablet Take 1 tablet (20 mg total) by mouth daily.  Marland Kitchen  polyethylene glycol (MIRALAX / GLYCOLAX) packet Take 17 g by mouth daily as needed  for moderate constipation.  . Rivaroxaban (XARELTO) 15 MG TABS tablet Take 15 mg by mouth every evening.   Marland Kitchen rOPINIRole (REQUIP) 0.5 MG tablet TAKE 1 TABLET BY MOUTH AT  BEDTIME  . timolol (TIMOPTIC) 0.5 % ophthalmic solution Place 1 drop into both eyes at bedtime.   . traMADol (ULTRAM) 50 MG tablet Take 1 tablet (50 mg total) by mouth 3 (three) times daily. At 6 am 12 pm and 10 pm  . trolamine salicylate (ASPERCREME) 10 % cream Apply 1 application topically 3 (three) times daily. Apply to right knee  . [DISCONTINUED] atorvastatin (LIPITOR) 80 MG tablet Take 80 mg by mouth daily at 6 PM.  . [DISCONTINUED] fish oil-omega-3 fatty acids 1000 MG capsule Take 1 g by mouth daily.   . [DISCONTINUED] furosemide (LASIX) 20 MG tablet Take 20 mg by mouth daily.  . [DISCONTINUED] metFORMIN (GLUCOPHAGE-XR) 500 MG 24 hr tablet Take 500 mg by mouth daily with breakfast.    No facility-administered encounter medications on file as of 01/18/2018.      SIGNIFICANT DIAGNOSTIC EXAMS  PREVIOUS;  12-21-17: ct of head:  1. No acute intracranial abnormality identified. 2. ASPECTS is 10. 3. Stable chronic microvascular ischemic changes, volume loss, and small chronic infarcts of the basal ganglia. 4. Chronic infarct of the left lentiform nucleus, caudate body, and corona radiata corresponding to the prior acute infarction on 07/22/2017 MRI of the brain. 5. Sphenoid sinus opacification with fluid level, progressed from prior study, compatible with acute sinusitis in the appropriate clinical setting.  12-21-17: MRI of brain: 1. Motion degraded examination.  No acute intracranial process. 2. Multiple old small supra- and infratentorial infarcts. 3. Moderate to severe parenchymal brain volume loss.  12-22-17: chest x-ray: No active disease.  NO NEW EXAMS.     LABS REVIEWED: PREVIOUS  07-21-17: wbc 10.1; hgb 13.8; hct 43.8; mcv 94.5; plt 323 glucose 221; bun 18; creat 1.21; k+ 4.2; na++ 136; ca 9.7 liver  normal albumin 3.6  07-22-17: hgb a1c 7.8 tsh 4.194 vit B 12: 342 07-24-17: wbc 10.5; hgb 12.9; hct 39.5; mcv 93.8 plt 331; glucose 121 bun 18; creat 1.13;  4.1; na++ 144; ca 9.4 chol 234; ldl 157; trig 218; hdl 33  10-09-17: urine micro-albumin: 5.9 10-25-17: mag 2.1  10-31-17: wbc 10.2; hgb 11.1; hct 32.5 ;mcv 93.8; plt 309 glucose 170; bun 19; creat 1.02; k+ 4.1; na++138; ca 9.1; liver normal albumin 3.2 urine culture: multiple species  11-08-17: urine culture: e-coli  12-21-17: wbc 10.4; hgb 11.5; hct 36.8; mcv 100.3; plt 317; glucose 87; bun 14; creat 1.22; k+ 3.9; na++ 140; ca 9.7; liver normal albumin 3.1  12-22-17: tsh 3.223 12-24-17: wbc 13.6; hgb 12.3; hct 37.6; mcv 95.9; plt 371; glucose 101; bun 7; creat 0.85; k+ 3.3; na++ 140; ca 9.6  12-16-17: wbc 12.0; gb 12.4; hct 38.5; mcv 98.0; plt 342; glucose 194; bun 14; creat 0.93; k+ 3.7; na++ 142; ca 9.5 01-03-18: wbc  9.7; hgb 11.6; hct 36.0; mcv 98.9 plt 327; glucose 90; bun 18; creat 1.05; k+ 3.4; na++ 144; ca 9.4 01-14-18; glucose 65; bun 22; creat 1.06; k+ 4.0; na++ 143; ca 9.0  TODAY;  01-17-18:  Vit D 38.1;  Vit B 12: 321  Review of Systems  Reason unable to perform ROS: lethargy     Physical Exam  Constitutional: She appears well-developed and well-nourished. No distress.  Neck: Thyromegaly present.  Mild thyroid enlargement   Cardiovascular: Normal rate, regular rhythm, normal heart sounds and intact distal pulses.  Pulmonary/Chest: Effort normal and breath sounds normal. No respiratory distress.  Abdominal: Soft. Bowel sounds are normal. She exhibits no distension. There is no tenderness.  Musculoskeletal: She exhibits no edema.  Has mild generalized weakness present Is able to move all extremities       Lymphadenopathy:    She has no cervical adenopathy.  Neurological: She is alert.  Skin: Skin is warm and dry. She is not diaphoretic.  Psychiatric: She has a normal mood and affect.     ASSESSMENT/ PLAN:  TODAY:    1. CAD-PCI OM1, 100% RCA (l-R collaterals): is stable will continue lopressor 25 mg twice daily has prn ntg   2. Dyslipidemia associated with type 2 diabetes mellitus: stable LDL 157 trig 218 : her lipitor and fish oil have been stopped   3.  Diabetes mellitus type 2 with neurological manifestations: is stable hgb a1c 7.8: will continue humalog 22 units with meals and toujeo 75 units nightly metformin has been stopped  4. Acute CVA: is neurologically stable will continue xarelto 15 mg daily   PREVIOUS   5. Diabetic neuropathy: is stable will continue neurontin 300 mg three times daily   6.  GERD without esophagitis: stable will continue prilosec 40 mg daily   7.  Chronic constipation: is stable will continue colace nightly senna twice daily  and miralax daily as needed  8.  Allergic rhinitis: is stable will continue astelin nasal spray nightly and xyzal 2.5 mg daily   9. UI: without change: will continue ditropan 5 mg twice daily   11.  Bilateral chronic glaucoma: is stable will continue timoptic to both eyes and lumigan to both eyes.   12. PLMD (periodic limb movement disorder) is stable will continue requip 0.5 mg nightly   13. Anxiety/sommloence : is stable will continue paxil 10 mg daily for one week prozac 10 mg daily for one week then increase to 20 mg daily and will stop paxil   14. CKD stage 2 due to type 2 diabetes mellitus: is stable bun 22; creat 1.06  15. Hypertensive heart disease with congestive heart failure and stage 2 kidney disease: is stable : will continue lopressor 25 mg twice daily   17. PAF( paroxymal atrial fibrillation) CHA2DS2- VASc score+6: heart rate is stable; will continue lopressor 25 mg twice daily for rate control and xarelto 15 mg daily   18.  Chronic diastolic heart failure: is stable will continue lasix 20 mg daily   19. Chronic bilateral lower back pain with right sided sciatica: is stable; will continue ultram  50 mg two times daily   lidoderm patch to her back daily and aspercreme to right knee   MD is aware of resident's narcotic use and is in agreement with current plan of care. We will attempt to wean resident as apropriate   Abigail Edwards NP [facility]  Contact [phone] Monday through Friday 8am- 5pm  After hours call [phone]

## 2018-01-19 DIAGNOSIS — R400 Somnolence: Secondary | ICD-10-CM | POA: Insufficient documentation

## 2018-01-19 NOTE — ACP (Advance Care Planning) (Signed)
We have discussed her advanced directives She is currently a DNR this will not change No further hospitalizations except for possible fractures or uncontrolled seizures No tube feeding Will continue to use abt and IVF fluids

## 2018-01-21 DIAGNOSIS — I69320 Aphasia following cerebral infarction: Secondary | ICD-10-CM | POA: Diagnosis not present

## 2018-01-21 DIAGNOSIS — K219 Gastro-esophageal reflux disease without esophagitis: Secondary | ICD-10-CM | POA: Diagnosis not present

## 2018-01-21 DIAGNOSIS — I69351 Hemiplegia and hemiparesis following cerebral infarction affecting right dominant side: Secondary | ICD-10-CM | POA: Diagnosis not present

## 2018-01-22 DIAGNOSIS — I69351 Hemiplegia and hemiparesis following cerebral infarction affecting right dominant side: Secondary | ICD-10-CM | POA: Diagnosis not present

## 2018-01-22 DIAGNOSIS — K219 Gastro-esophageal reflux disease without esophagitis: Secondary | ICD-10-CM | POA: Diagnosis not present

## 2018-01-22 DIAGNOSIS — I69320 Aphasia following cerebral infarction: Secondary | ICD-10-CM | POA: Diagnosis not present

## 2018-01-23 ENCOUNTER — Encounter: Payer: Self-pay | Admitting: Adult Health

## 2018-01-23 ENCOUNTER — Non-Acute Institutional Stay (SKILLED_NURSING_FACILITY): Payer: Medicare Other | Admitting: Adult Health

## 2018-01-23 DIAGNOSIS — I130 Hypertensive heart and chronic kidney disease with heart failure and stage 1 through stage 4 chronic kidney disease, or unspecified chronic kidney disease: Secondary | ICD-10-CM

## 2018-01-23 DIAGNOSIS — N182 Chronic kidney disease, stage 2 (mild): Secondary | ICD-10-CM

## 2018-01-23 DIAGNOSIS — I5032 Chronic diastolic (congestive) heart failure: Secondary | ICD-10-CM | POA: Diagnosis not present

## 2018-01-23 DIAGNOSIS — E1122 Type 2 diabetes mellitus with diabetic chronic kidney disease: Secondary | ICD-10-CM | POA: Diagnosis not present

## 2018-01-23 DIAGNOSIS — I480 Paroxysmal atrial fibrillation: Secondary | ICD-10-CM

## 2018-01-23 NOTE — Progress Notes (Signed)
Location:   [facility] Room Number: [place] of Service:  SNF ([phone])   CODE STATUS: DNR  Allergies  Allergen Reactions  . Amlodipine Cough  . Clopidogrel Bisulfate Cough  . Kenalog [Triamcinolone Acetonide] Other (See Comments)    unknown  . Lisinopril Cough  . Losartan Cough  . Sulfa Antibiotics Hives and Rash    Chief Complaint  Patient presents with  . Medical Management of Chronic Issues    Hypertensive heart disease with congestive heart failure and stage 2 kidney disease; PAF; chronic diastolic heart failure; CKD stage 2 due to type 2 diabetes mellitus.  Weekly follow up for the first 30 days post hospitalization     HPI:  She is a long term resident of this facility being seen for the management of her chronic illnesses: hypertensive heart disease; paf; diastolic heart disease; ckd stage 2. She is unable to participate in the hpi or ros. Her blood pressure readings have been low and will need to lower her lopressor. There are no reports of uncontrolled pain; no changes in appetite; no anxiety or agitation.   Past Medical History:  Diagnosis Date  . Abnormal nuclear stress test 06/21/2011   Inferolateral reversible defect;--> cardiac cath & PCI of CxOM, occluded RCA with collaterals;; followup Myoview 11/2012: Low risk. Fixed basal inferior artifact normal EF. No ischemia  . Anxiety   . Asthma   . Bilateral cataracts    Status post stroke or correction  . CAD S/P percutaneous coronary angioplasty 06/2011   s/P PCI to proximal OM1 w/ DES; occluded RCA with bridging and L-R collaterals (medical management)  . Chronic kidney disease (CKD), stage II (mild)    Related to current bladder infections (although diabetes cannot be excluded)  . Chronotropic incompetence with sinus node dysfunction [facility]) October 2013   On CPET test; beta blockers reduced  . Diabetes mellitus type 2, controlled ([place])    On oral medications  . Diverticulitis   .  Essential hypertension    Allowing for permissive hypertension to avoid orthostatic hypotension  . GERD (gastroesophageal reflux disease)    On PPI  . Glaucoma   . History of syncope    Per EP - neurocardiogenic & not Bradycardia related (no PPM)  . History of unstable angina 06/13/2011   Jaw pain awakening from sleep -- Myoview --CATH --> PCI  . Hyperlipidemia with target LDL less than 70    HDL at goal, LDL not at goal, borderline triglycerides. On Crestor 20 mg  . Migraines   . OSA (obstructive sleep apnea)    hx bladder infections  . Osteoarthritis   . PAF (paroxysmal atrial fibrillation) ([place]) 10-11/ 2014   CardioNet Event Monitor: NSR & S [place] -- Rates 50-100; Total A. fib burden 35 hours and 27 minutes. 1296 episodes, longest was 1 hour 29 minutes. Rate ranged from 52-169 beats per minute.  . Seasonal allergies   . Shortness of breath on exertion October 2013   2-D echo: Normal EF>55%, Gr 1DD, mild aortic sclerosis; Evaluated with CPET - peak VO2 97%; Chronotropic Incompetence (submaximal effort)  . Spinal stenosis of lumbar region 11/2011  . Stroke ([place]) 06/2017  . Stroke ([place]) 11/2014  . Tachycardia-bradycardia syndrome ([place]) 11/2012  . Urge incontinence     Past Surgical History:  Procedure Laterality Date  . APPENDECTOMY    . BREAST BIOPSY     both breast  . cataracts     both eyes  .  COLONOSCOPY    . CORONARY ANGIOPLASTY WITH STENT PLACEMENT  07/05/2011   Promus Element DES 2.5 mm x 16 mm-post dilated to 2.65 mm CX-Proximal OM1  . Holter Monitor  04/2015   Sinus rhythm with rates 52-149 BPM. Isolated PACs with rare couplets and bigeminy. Multiple short runs of PAT/PSVT. Arrhythmia run 120 bpm for 204 beats. 14% of time was in A. fib/flutter. - Reviewed by Dr. Caryl Comes. Not thought to be significant enough for her syncope.  Marland Kitchen KNEE ARTHROSCOPY WITH MEDIAL MENISECTOMY Right 06/24/2014   Procedure: RIGHT KNEE ARTHROSCOPY WITH partial lateral MENISECTOMY, abrasion  chondroplasty of medial femoral condyle and patella, microfracture technique;  Surgeon: Latanya Maudlin, MD;  Location: [facility];  Service: Orthopedics;  Laterality: Right;  . LEFT HEART CATHETERIZATION WITH CORONARY ANGIOGRAM N/A 07/06/2011   Procedure: LEFT HEART CATHETERIZATION WITH CORONARY ANGIOGRAM;  Surgeon: Leonie Man, MD;  Location: [facility] CATH LAB: Unstable Angina --> Myoview wiht Inf-Lat Ischemia.  Proximal OM1 lesion --> PCI; 100% mid RCA with right to right and left to right bridging collaterals from circumflex RPL and LAD the PDA.  Marland Kitchen NM MYOVIEW LTD  October 2014    Low risk. Fixed basal inferior artifact normal EF. No ischemia --> (as compared to pre-PCI Myoview revealing inferolateral ischemia)  . rotator cuff Right   . TRANSTHORACIC ECHOCARDIOGRAM  11/2014   EF 65-70%, Mod LVH. Normal wall motion. Gr 1 DD. Normal valves.  Marland Kitchen VAGINAL HYSTERECTOMY      Social History   Socioeconomic History  . Marital status: Widowed    Spouse name: Not on file  . Number of children: Not on file  . Years of education: Not on file  . Highest education level: Not on file  Occupational History  . Occupation: retired  Scientific laboratory technician  . Financial resource strain: Not hard at all  . Food insecurity:    Worry: Patient refused    Inability: Patient refused  . Transportation needs:    Medical: Patient refused    Non-medical: Patient refused  Tobacco Use  . Smoking status: Never Smoker  . Smokeless tobacco: Never Used  Substance and Sexual Activity  . Alcohol use: No  . Drug use: No  . Sexual activity: Never    Birth control/protection: Post-menopausal  Lifestyle  . Physical activity:    Days per week: Patient refused    Minutes per session: Patient refused  . Stress: Only a little  Relationships  . Social connections:    Talks on phone: Patient refused    Gets together: Patient refused    Attends religious service: Patient refused    Active member of club or organization: Patient refused      Attends meetings of clubs or organizations: Patient refused    Relationship status: Patient refused  . Intimate partner violence:    Fear of current or ex partner: Patient refused    Emotionally abused: Patient refused    Physically abused: Patient refused    Forced sexual activity: Patient refused  Other Topics Concern  . Not on file  Social History Narrative   Widowed mother of one, grandmother 2. Has not been using silver take as much lately due to knee discomfort. Usually exercises with Silver Sneakers on regular basis.   Family History  Problem Relation Age of Onset  . COPD Mother   . Pulmonary fibrosis Mother   . Heart attack Father   . Heart disease Father   . Stroke Father   . Cancer Sister   .  Cancer Sister   . Clotting disorder Sister   . Heart attack Sister   . Arthritis Unknown   . Breast cancer Sister   . Colon cancer Neg Hx   . Esophageal cancer Neg Hx   . Stomach cancer Neg Hx       VITAL SIGNS BP (!) 92/52   Pulse 61   Temp (!) 97.5 F (36.4 C)   Resp 12   Ht 5\' 4"  (1.626 m)   Wt 141 lb 14.4 oz (64.4 kg)   SpO2 97%   BMI 24.36 kg/m   Outpatient Encounter Medications as of 01/23/2018  Medication Sig  . acetaminophen (TYLENOL) 650 MG CR tablet Take 650 mg by mouth every 6 (six) hours as needed for pain.  Marland Kitchen azelastine (ASTELIN) 0.1 % nasal spray Place 2 sprays into both nostrils at bedtime. Use in each nostril as directed   . divalproex (DEPAKOTE) 125 MG DR tablet Take 125 mg by mouth at bedtime.   . docusate sodium (STOOL SOFTENER) 100 MG capsule Take 100 mg by mouth daily.   Marland Kitchen FLUoxetine (PROZAC) 10 MG tablet Take 10 mg by mouth daily. Give 10 mg by  Mouth daily 01/17/17 - 01/24/18, then increase to 20 mg by mouth daily  . furosemide (LASIX) 20 MG tablet Take 20 mg by mouth daily.  Marland Kitchen gabapentin (NEURONTIN) 300 MG capsule Take 300 mg by mouth 3 (three) times daily.   Marland Kitchen glucagon (GLUCAGON EMERGENCY) 1 MG injection Inject 1 mg into the vein once as  needed. Give 1 mg IM for Hypoglycemia (symptomatic) not responding to oral glucose, snack. May repeat x 1 after 15 minutes. USE ONLY IF PT IS OR IS BECOMING UNRESPONSIVE  . GLUCERNA (GLUCERNA) LIQD Take 237 mLs by mouth 2 (two) times daily between meals.  Marland Kitchen HUMALOG KWIKPEN 100 UNIT/ML KiwkPen Inject 22 Units into the skin 3 (three) times daily with meals. DM2. Hold for CBG < 200  . Infant Care Products [facility] EX) Apply liberal amount topically to area of skin irritation prn. OK to leave at bedside.  . Insulin Glargine (TOUJEO MAX SOLOSTAR) 300 UNIT/ML SOPN Inject 50 Units into the skin at bedtime. Alternate abdominal injection sites.  . latanoprost (XALATAN) 0.005 % ophthalmic solution Place 1 drop into both eyes at bedtime.  . levETIRAcetam (KEPPRA) 500 MG tablet Take 1 tablet (500 mg total) by mouth 2 (two) times daily.  Marland Kitchen levocetirizine (XYZAL) 5 MG tablet Take 2.5 mg by mouth daily.   . Lidocaine-Menthol (ICY HOT LIDOCAINE PLUS MENTHOL) 4-1 % PTCH Apply 1 patch topically daily. apply to lower back in the a.m. for pain management  . Magnesium 500 MG CAPS Take 500 mg by mouth at bedtime.   . metoprolol tartrate (LOPRESSOR) 25 MG tablet Take 1 tablet (25 mg total) by mouth 2 (two) times daily.  . mirtazapine (REMERON SOL-TAB) 15 MG disintegrating tablet Take 0.5 tablets (7.5 mg total) by mouth at bedtime.  . nitroGLYCERIN (NITROSTAT) 0.4 MG SL tablet Place 0.4 mg under the tongue every 5 (five) minutes as needed for chest pain.  . NON FORMULARY Diet Order: Upgrade patient to dysphagia 2 consistencies (ground), continue thin liquids; Straws ok  . omeprazole (PRILOSEC) 40 MG capsule Take 40 mg by mouth daily.  Marland Kitchen oxybutynin (DITROPAN) 5 MG tablet Take 5 mg by mouth 2 (two) times daily.  . OXYGEN Inhale 2 L/min into the lungs 2 (two) times daily as needed.  Marland Kitchen PARoxetine (PAXIL) 10 MG tablet Take 10  mg by mouth daily.  . polyethylene glycol (MIRALAX / GLYCOLAX) packet Take 17 g by mouth daily as  needed for moderate constipation.  . Rivaroxaban (XARELTO) 15 MG TABS tablet Take 15 mg by mouth every evening.   Marland Kitchen rOPINIRole (REQUIP) 0.5 MG tablet TAKE 1 TABLET BY MOUTH AT  BEDTIME  . timolol (TIMOPTIC) 0.5 % ophthalmic solution Place 1 drop into both eyes at bedtime.   . traMADol (ULTRAM) 50 MG tablet Take 1 tablet (50 mg total) by mouth 3 (three) times daily. At 6 am 12 pm and 10 pm  . trolamine salicylate (ASPERCREME) 10 % cream Apply 1 application topically 3 (three) times daily. Apply to right knee  . [DISCONTINUED] PARoxetine (PAXIL) 20 MG tablet Take 1 tablet (20 mg total) by mouth daily. (Patient not taking: Reported on 01/23/2018)   No facility-administered encounter medications on file as of 01/23/2018.      SIGNIFICANT DIAGNOSTIC EXAMS   PREVIOUS;  12-21-17: ct of head:  1. No acute intracranial abnormality identified. 2. ASPECTS is 10. 3. Stable chronic microvascular ischemic changes, volume loss, and small chronic infarcts of the basal ganglia. 4. Chronic infarct of the left lentiform nucleus, caudate body, and corona radiata corresponding to the prior acute infarction on 07/22/2017 MRI of the brain. 5. Sphenoid sinus opacification with fluid level, progressed from prior study, compatible with acute sinusitis in the appropriate clinical setting.  12-21-17: MRI of brain: 1. Motion degraded examination.  No acute intracranial process. 2. Multiple old small supra- and infratentorial infarcts. 3. Moderate to severe parenchymal brain volume loss.  12-22-17: chest x-ray: No active disease.  NO NEW EXAMS.     LABS REVIEWED: PREVIOUS  07-21-17: wbc 10.1; hgb 13.8; hct 43.8; mcv 94.5; plt 323 glucose 221; bun 18; creat 1.21; k+ 4.2; na++ 136; ca 9.7 liver normal albumin 3.6  07-22-17: hgb a1c 7.8 tsh 4.194 vit B 12: 342 07-24-17: wbc 10.5; hgb 12.9; hct 39.5; mcv 93.8 plt 331; glucose 121 bun 18; creat 1.13;  4.1; na++ 144; ca 9.4 chol 234; ldl 157; trig 218; hdl 33    10-09-17: urine micro-albumin: 5.9 10-25-17: mag 2.1  10-31-17: wbc 10.2; hgb 11.1; hct 32.5 ;mcv 93.8; plt 309 glucose 170; bun 19; creat 1.02; k+ 4.1; na++138; ca 9.1; liver normal albumin 3.2 urine culture: multiple species  11-08-17: urine culture: e-coli  12-21-17: wbc 10.4; hgb 11.5; hct 36.8; mcv 100.3; plt 317; glucose 87; bun 14; creat 1.22; k+ 3.9; na++ 140; ca 9.7; liver normal albumin 3.1  12-22-17: tsh 3.223 12-24-17: wbc 13.6; hgb 12.3; hct 37.6; mcv 95.9; plt 371; glucose 101; bun 7; creat 0.85; k+ 3.3; na++ 140; ca 9.6  12-16-17: wbc 12.0; gb 12.4; hct 38.5; mcv 98.0; plt 342; glucose 194; bun 14; creat 0.93; k+ 3.7; na++ 142; ca 9.5 01-03-18: wbc  9.7; hgb 11.6; hct 36.0; mcv 98.9 plt 327; glucose 90; bun 18; creat 1.05; k+ 3.4; na++ 144; ca 9.4 01-14-18; glucose 65; bun 22; creat 1.06; k+ 4.0; na++ 143; ca 9.0 01-17-18:  Vit D 38.1;  Vit B 12: 321  NO NEW LABS.    Review of Systems  Reason unable to perform ROS: Confusion.    Physical Exam  Constitutional: She appears well-developed and well-nourished. No distress.  Neck: Thyromegaly present.  Mild thyroid enlargement   Cardiovascular: Normal rate, regular rhythm, normal heart sounds and intact distal pulses.  Pulmonary/Chest: Effort normal and breath sounds normal. No respiratory distress.  Abdominal: Soft. Bowel sounds  are normal. She exhibits no distension. There is no tenderness.  Musculoskeletal: She exhibits no edema.  Has mild generalized weakness present Is able to move all extremities   Lymphadenopathy:    She has no cervical adenopathy.  Neurological: She is alert.  Skin: Skin is warm and dry. She is not diaphoretic.  Psychiatric: She has a normal mood and affect.      ASSESSMENT/ PLAN:  TODAY:   1. CKD stage 2 due to type 2 diabetes mellitus: is stable bun 22; creat 1.06  2. Hypertensive heart disease with congestive heart failure and stage 2 kidney disease: is stable : b/p 92/52 will lower   lopressor to 12.5 mg twice daily   3. PAF( paroxymal atrial fibrillation) CHA2DS2- VASc score+6: heart rate is stable; will continue lopressor 25 mg twice daily for rate control and xarelto 15 mg daily   4.  Chronic diastolic heart failure: is stable will continue lasix 20 mg daily   PREVIOUS   5. Diabetic neuropathy: is stable will continue neurontin 300 mg three times daily   6.  GERD without esophagitis: stable will continue prilosec 40 mg daily   7.  Chronic constipation: is stable will continue colace nightly senna twice daily  and miralax daily as needed  8.  Allergic rhinitis: is stable will continue astelin nasal spray nightly and xyzal 2.5 mg daily   9. UI: without change: will continue ditropan 5 mg twice daily   10.  Bilateral chronic glaucoma: is stable will continue timoptic to both eyes and lumigan to both eyes.   11. PLMD (periodic limb movement disorder) is stable will continue requip 0.5 mg nightly   12. Anxiety/sommloence : is stable will continue paxil 10 mg daily for one week prozac 10 mg daily for one week then increase to 20 mg daily and will stop paxil   13. Chronic bilateral lower back pain with right sided sciatica: is stable; will continue ultram  50 mg two times daily  lidoderm patch to her back daily and aspercreme to right knee   14. CAD-PCI OM1, 100% RCA (l-R collaterals): is stable will lower to  lopressor 12. 5 mg twice daily  Due to low blood pressure has prn ntg   15. Dyslipidemia associated with type 2 diabetes mellitus: stable LDL 157 trig 218 : her lipitor and fish oil have been stopped   16.  Diabetes mellitus type 2 with neurological manifestations: is stable hgb a1c 7.8: will continue humalog 22 units with meals and toujeo 75 units nightly metformin has been stopped  17. Acute CVA: is neurologically stable will continue xarelto 15 mg daily       MD is aware of resident's narcotic use and is in agreement with current plan of care. We will  attempt to wean resident as apropriate   Ok Edwards NP [facility]  Contact [phone] Monday through Friday 8am- 5pm  After hours call [phone]

## 2018-01-27 ENCOUNTER — Encounter
Admission: RE | Admit: 2018-01-27 | Discharge: 2018-01-27 | Disposition: A | Discharge status: Home / Self Care | Payer: Medicare Other | Source: Ambulatory Visit | Attending: Internal Medicine | Admitting: Internal Medicine

## 2018-01-28 ENCOUNTER — Encounter: Payer: Self-pay | Admitting: Adult Health

## 2018-01-28 ENCOUNTER — Non-Acute Institutional Stay (SKILLED_NURSING_FACILITY): Payer: Medicare Other | Admitting: Adult Health

## 2018-01-28 DIAGNOSIS — K5900 Constipation, unspecified: Secondary | ICD-10-CM | POA: Diagnosis not present

## 2018-01-28 DIAGNOSIS — K219 Gastro-esophageal reflux disease without esophagitis: Secondary | ICD-10-CM | POA: Diagnosis not present

## 2018-01-28 DIAGNOSIS — J309 Allergic rhinitis, unspecified: Secondary | ICD-10-CM | POA: Diagnosis not present

## 2018-01-28 DIAGNOSIS — E1149 Type 2 diabetes mellitus with other diabetic neurological complication: Secondary | ICD-10-CM | POA: Diagnosis not present

## 2018-01-28 NOTE — Progress Notes (Signed)
Location:   [facility] Room Number: [place] of Service:  SNF (31)   CODE STATUS: DNR  Allergies  Allergen Reactions  . Amlodipine Cough  . Clopidogrel Bisulfate Cough  . Kenalog [Triamcinolone Acetonide] Other (See Comments)    unknown  . Lisinopril Cough  . Losartan Cough  . Sulfa Antibiotics Hives and Rash    Chief Complaint  Patient presents with  . Medical Management of Chronic Issues    Allergic rhinitis unspecified seasonality unspecified trigger; gastroesophageal reflux disease without esophagitis; other diabetic neurological complication associated with type 2 diabetes mellitus; constipation unspecified constipation     HPI:  She is a 84 year old long term resident of this facility being seen for the management of her chronic illnesses: allergic rhinitis; gerd; diabetic neuropathy; constipation. She is more awake since her medication changes. She is unable to fully participate in the hpi or ros. There are no reports of uncontrolled pain; no constipation; no heart burn.   Past Medical History:  Diagnosis Date  . Abnormal nuclear stress test 06/21/2011   Inferolateral reversible defect;--> cardiac cath & PCI of CxOM, occluded RCA with collaterals;; followup Myoview 11/2012: Low risk. Fixed basal inferior artifact normal EF. No ischemia  . Anxiety   . Asthma   . Bilateral cataracts    Status post stroke or correction  . CAD S/P percutaneous coronary angioplasty 06/2011   s/P PCI to proximal OM1 w/ DES; occluded RCA with bridging and L-R collaterals (medical management)  . Chronic kidney disease (CKD), stage II (mild)    Related to current bladder infections (although diabetes cannot be excluded)  . Chronotropic incompetence with sinus node dysfunction [facility]) October 2013   On CPET test; beta blockers reduced  . Diabetes mellitus type 2, controlled ([place])    On oral medications  . Diverticulitis   . Essential hypertension    Allowing  for permissive hypertension to avoid orthostatic hypotension  . GERD (gastroesophageal reflux disease)    On PPI  . Glaucoma   . History of syncope    Per EP - neurocardiogenic & not Bradycardia related (no PPM)  . History of unstable angina 06/13/2011   Jaw pain awakening from sleep -- Myoview --CATH --> PCI  . Hyperlipidemia with target LDL less than 70    HDL at goal, LDL not at goal, borderline triglycerides. On Crestor 20 mg  . Migraines   . OSA (obstructive sleep apnea)    hx bladder infections  . Osteoarthritis   . PAF (paroxysmal atrial fibrillation) ([place]) 10-11/ 2014   CardioNet Event Monitor: NSR & S Brady -- Rates 50-100; Total A. fib burden 35 hours and 27 minutes. 1296 episodes, longest was 1 hour 29 minutes. Rate ranged from 52-169 beats per minute.  . Seasonal allergies   . Shortness of breath on exertion October 2013   2-D echo: Normal EF>55%, Gr 1DD, mild aortic sclerosis; Evaluated with CPET - peak VO2 97%; Chronotropic Incompetence (submaximal effort)  . Spinal stenosis of lumbar region 11/2011  . Stroke ([place]) 06/2017  . Stroke ([place]) 11/2014  . Tachycardia-bradycardia syndrome ([place]) 11/2012  . Urge incontinence     Past Surgical History:  Procedure Laterality Date  . APPENDECTOMY    . BREAST BIOPSY     both breast  . cataracts     both eyes  . COLONOSCOPY    . CORONARY ANGIOPLASTY WITH STENT PLACEMENT  07/05/2011   Promus Element DES 2.5 mm x 16  mm-post dilated to 2.65 mm CX-Proximal OM1  . Holter Monitor  04/2015   Sinus rhythm with rates 52-149 BPM. Isolated PACs with rare couplets and bigeminy. Multiple short runs of PAT/PSVT. Arrhythmia run 120 bpm for 204 beats. 14% of time was in A. fib/flutter. - Reviewed by Dr. Caryl Comes. Not thought to be significant enough for her syncope.  Marland Kitchen KNEE ARTHROSCOPY WITH MEDIAL MENISECTOMY Right 06/24/2014   Procedure: RIGHT KNEE ARTHROSCOPY WITH partial lateral MENISECTOMY, abrasion chondroplasty of medial femoral condyle and  patella, microfracture technique;  Surgeon: Latanya Maudlin, MD;  Location: [facility];  Service: Orthopedics;  Laterality: Right;  . LEFT HEART CATHETERIZATION WITH CORONARY ANGIOGRAM N/A 07/06/2011   Procedure: LEFT HEART CATHETERIZATION WITH CORONARY ANGIOGRAM;  Surgeon: Leonie Man, MD;  Location: [facility] CATH LAB: Unstable Angina --> Myoview wiht Inf-Lat Ischemia.  Proximal OM1 lesion --> PCI; 100% mid RCA with right to right and left to right bridging collaterals from circumflex RPL and LAD the PDA.  Marland Kitchen NM MYOVIEW LTD  October 2014    Low risk. Fixed basal inferior artifact normal EF. No ischemia --> (as compared to pre-PCI Myoview revealing inferolateral ischemia)  . rotator cuff Right   . TRANSTHORACIC ECHOCARDIOGRAM  11/2014   EF 65-70%, Mod LVH. Normal wall motion. Gr 1 DD. Normal valves.  Marland Kitchen VAGINAL HYSTERECTOMY      Social History   Socioeconomic History  . Marital status: Widowed    Spouse name: Not on file  . Number of children: Not on file  . Years of education: Not on file  . Highest education level: Not on file  Occupational History  . Occupation: retired  Scientific laboratory technician  . Financial resource strain: Not hard at all  . Food insecurity:    Worry: Patient refused    Inability: Patient refused  . Transportation needs:    Medical: Patient refused    Non-medical: Patient refused  Tobacco Use  . Smoking status: Never Smoker  . Smokeless tobacco: Never Used  Substance and Sexual Activity  . Alcohol use: No  . Drug use: No  . Sexual activity: Never    Birth control/protection: Post-menopausal  Lifestyle  . Physical activity:    Days per week: Patient refused    Minutes per session: Patient refused  . Stress: Only a little  Relationships  . Social connections:    Talks on phone: Patient refused    Gets together: Patient refused    Attends religious service: Patient refused    Active member of club or organization: Patient refused    Attends meetings of clubs or  organizations: Patient refused    Relationship status: Patient refused  . Intimate partner violence:    Fear of current or ex partner: Patient refused    Emotionally abused: Patient refused    Physically abused: Patient refused    Forced sexual activity: Patient refused  Other Topics Concern  . Not on file  Social History Narrative   Widowed mother of one, grandmother 2. Has not been using silver take as much lately due to knee discomfort. Usually exercises with Silver Sneakers on regular basis.   Family History  Problem Relation Age of Onset  . COPD Mother   . Pulmonary fibrosis Mother   . Heart attack Father   . Heart disease Father   . Stroke Father   . Cancer Sister   . Cancer Sister   . Clotting disorder Sister   . Heart attack Sister   . Arthritis Unknown   .  Breast cancer Sister   . Colon cancer Neg Hx   . Esophageal cancer Neg Hx   . Stomach cancer Neg Hx       VITAL SIGNS Pulse 60   Ht 5\' 4"  (1.626 m)   Wt 141 lb 14.4 oz (64.4 kg)   SpO2 98%   BMI 24.36 kg/m   B/p 121/47  Outpatient Encounter Medications as of 01/28/2018  Medication Sig  . acetaminophen (TYLENOL) 650 MG CR tablet Take 650 mg by mouth every 6 (six) hours as needed for pain.  Marland Kitchen azelastine (ASTELIN) 0.1 % nasal spray Place 2 sprays into both nostrils at bedtime. Use in each nostril as directed   . bisacodyl (DULCOLAX) 10 MG suppository Place 10 mg rectally daily as needed for moderate constipation.  . docusate sodium (STOOL SOFTENER) 100 MG capsule Take 100 mg by mouth daily.   Marland Kitchen FLUoxetine (PROZAC) 20 MG tablet Take 20 mg by mouth daily.  . furosemide (LASIX) 20 MG tablet Take 20 mg by mouth daily.  Marland Kitchen gabapentin (NEURONTIN) 300 MG capsule Take 300 mg by mouth 3 (three) times daily.   Marland Kitchen glucagon (GLUCAGON EMERGENCY) 1 MG injection Inject 1 mg into the vein once as needed. Give 1 mg IM for Hypoglycemia (symptomatic) not responding to oral glucose, snack. May repeat x 1 after 15 minutes. USE ONLY  IF PT IS OR IS BECOMING UNRESPONSIVE  . GLUCERNA (GLUCERNA) LIQD Take 237 mLs by mouth 2 (two) times daily between meals.  Marland Kitchen HUMALOG KWIKPEN 100 UNIT/ML KiwkPen Inject 22 Units into the skin 3 (three) times daily with meals. DM2. Hold for CBG < 200  . Infant Care Products [facility] EX) Apply liberal amount topically to area of skin irritation prn. OK to leave at bedside.  . Insulin Glargine (TOUJEO MAX SOLOSTAR) 300 UNIT/ML SOPN Inject 50 Units into the skin at bedtime. Alternate abdominal injection sites.  . latanoprost (XALATAN) 0.005 % ophthalmic solution Place 1 drop into both eyes at bedtime.  . levETIRAcetam (KEPPRA) 500 MG tablet Take 1 tablet (500 mg total) by mouth 2 (two) times daily.  Marland Kitchen levocetirizine (XYZAL) 5 MG tablet Take 2.5 mg by mouth daily.   . Lidocaine-Menthol (ICY HOT LIDOCAINE PLUS MENTHOL) 4-1 % PTCH Apply 1 patch topically daily. apply to lower back in the a.m. for pain management  . Magnesium 500 MG CAPS Take 500 mg by mouth at bedtime.   . metoprolol tartrate (LOPRESSOR) 25 MG tablet Take 12.5  mg by mouth 2 (two) times daily.  . mirtazapine (REMERON SOL-TAB) 15 MG disintegrating tablet Take 0.5 tablets (7.5 mg total) by mouth at bedtime.  . nitroGLYCERIN (NITROSTAT) 0.4 MG SL tablet Place 0.4 mg under the tongue every 5 (five) minutes as needed for chest pain.  . NON FORMULARY Diet Order: Upgrade patient to dysphagia 2 consistencies (ground), continue thin liquids; Straws ok  . Nutritional Supplements (NUTRITIONAL SUPPLEMENT PO) Snack at bedtime  . omeprazole (PRILOSEC) 40 MG capsule Take 40 mg by mouth daily.  Marland Kitchen oxybutynin (DITROPAN) 5 MG tablet Take 5 mg by mouth 2 (two) times daily.  . OXYGEN Inhale 2 L/min into the lungs 2 (two) times daily as needed.  . polyethylene glycol (MIRALAX / GLYCOLAX) packet Take 17 g by mouth daily as needed for moderate constipation.  . Rivaroxaban (XARELTO) 15 MG TABS tablet Take 15 mg by mouth every evening.   Marland Kitchen rOPINIRole (REQUIP)  0.5 MG tablet TAKE 1 TABLET BY MOUTH AT  BEDTIME  . timolol (  TIMOPTIC) 0.5 % ophthalmic solution Place 1 drop into both eyes at bedtime.   . traMADol (ULTRAM) 50 MG tablet Take by mouth 3 times daily   . trolamine salicylate (ASPERCREME) 10 % cream Apply 1 application topically 3 (three) times daily. Apply to right knee  . [DISCONTINUED] metoprolol tartrate (LOPRESSOR) 25 MG tablet Take 1 tablet (25 mg total) by mouth 2 (two) times daily. (Patient not taking: Reported on 01/28/2018)  . [DISCONTINUED] traMADol (ULTRAM) 50 MG tablet Take 1 tablet (50 mg total) by mouth 3 (three) times daily. At 6 am 12 pm and 10 pm (Patient not taking: Reported on 01/28/2018)   No facility-administered encounter medications on file as of 01/28/2018.      SIGNIFICANT DIAGNOSTIC EXAMS  PREVIOUS;  12-21-17: ct of head:  1. No acute intracranial abnormality identified. 2. ASPECTS is 10. 3. Stable chronic microvascular ischemic changes, volume loss, and small chronic infarcts of the basal ganglia. 4. Chronic infarct of the left lentiform nucleus, caudate body, and corona radiata corresponding to the prior acute infarction on 07/22/2017 MRI of the brain. 5. Sphenoid sinus opacification with fluid level, progressed from prior study, compatible with acute sinusitis in the appropriate clinical setting.  12-21-17: MRI of brain: 1. Motion degraded examination.  No acute intracranial process. 2. Multiple old small supra- and infratentorial infarcts. 3. Moderate to severe parenchymal brain volume loss.  12-22-17: chest x-ray: No active disease.  NO NEW EXAMS.     LABS REVIEWED: PREVIOUS  07-21-17: wbc 10.1; hgb 13.8; hct 43.8; mcv 94.5; plt 323 glucose 221; bun 18; creat 1.21; k+ 4.2; na++ 136; ca 9.7 liver normal albumin 3.6  07-22-17: hgb a1c 7.8 tsh 4.194 vit B 12: 342 07-24-17: wbc 10.5; hgb 12.9; hct 39.5; mcv 93.8 plt 331; glucose 121 bun 18; creat 1.13;  4.1; na++ 144; ca 9.4 chol 234; ldl 157; trig 218; hdl 33   10-09-17: urine micro-albumin: 5.9 10-25-17: mag 2.1  10-31-17: wbc 10.2; hgb 11.1; hct 32.5 ;mcv 93.8; plt 309 glucose 170; bun 19; creat 1.02; k+ 4.1; na++138; ca 9.1; liver normal albumin 3.2 urine culture: multiple species  11-08-17: urine culture: e-coli  12-21-17: wbc 10.4; hgb 11.5; hct 36.8; mcv 100.3; plt 317; glucose 87; bun 14; creat 1.22; k+ 3.9; na++ 140; ca 9.7; liver normal albumin 3.1  12-22-17: tsh 3.223 12-24-17: wbc 13.6; hgb 12.3; hct 37.6; mcv 95.9; plt 371; glucose 101; bun 7; creat 0.85; k+ 3.3; na++ 140; ca 9.6  12-16-17: wbc 12.0; gb 12.4; hct 38.5; mcv 98.0; plt 342; glucose 194; bun 14; creat 0.93; k+ 3.7; na++ 142; ca 9.5 01-03-18: wbc  9.7; hgb 11.6; hct 36.0; mcv 98.9 plt 327; glucose 90; bun 18; creat 1.05; k+ 3.4; na++ 144; ca 9.4 01-14-18; glucose 65; bun 22; creat 1.06; k+ 4.0; na++ 143; ca 9.0 01-17-18:  Vit D 38.1;  Vit B 12: 321  NO NEW LABS.    Review of Systems  Reason unable to perform ROS: confusion.    Physical Exam  Constitutional: She appears well-developed and well-nourished. No distress.  Neck: Thyromegaly present.  Mild thyroid enlargement   Cardiovascular: Normal rate, regular rhythm, normal heart sounds and intact distal pulses.  Pulmonary/Chest: Effort normal and breath sounds normal. No respiratory distress.  Abdominal: Soft. Bowel sounds are normal. She exhibits no distension. There is no tenderness.  Musculoskeletal: She exhibits no edema.  Is able to move all extremities Has generalized weakness   Lymphadenopathy:    She has no  cervical adenopathy.  Neurological: She is alert.  Skin: Skin is warm and dry. She is not diaphoretic.  Psychiatric: She has a normal mood and affect.    ASSESSMENT/ PLAN:  TODAY:   1. Diabetic neuropathy: is stable will continue neurontin 300 mg three times daily   2.  GERD without esophagitis: stable will continue prilosec 40 mg daily   3.  Chronic constipation: is stable will continue colace  nightly senna twice daily  and miralax daily as needed  4.  Allergic rhinitis: is stable will continue astelin nasal spray nightly and xyzal 2.5 mg daily   PREVIOUS   5. UI: without change: will continue ditropan 5 mg twice daily   6.  Bilateral chronic glaucoma: is stable will continue timoptic to both eyes and lumigan to both eyes.   7. PLMD (periodic limb movement disorder) is stable will continue requip 0.5 mg nightly   8. Anxiety/sommloence : is stable will continue prozac 20 mg daily   9. Chronic bilateral lower back pain with right sided sciatica: is stable; will continue ultram  50 mg two times daily  lidoderm patch to her back daily and aspercreme to right knee   10. CAD-PCI OM1, 100% RCA (l-R collaterals): is stable will continue  lopressor 12. 5 mg twice daily  Due to low blood pressure has prn ntg   11. Dyslipidemia associated with type 2 diabetes mellitus: stable LDL 157 trig 218 : her lipitor and fish oil have been stopped   12.  Diabetes mellitus type 2 with neurological manifestations: is stable hgb a1c 7.8: will continue humalog 22 units with meals and toujeo 50 units nightly metformin has been stopped  13. Acute CVA: is neurologically stable will continue xarelto 15 mg daily   14. CKD stage 2 due to type 2 diabetes mellitus: is stable bun 22; creat 1.06  15. Hypertensive heart disease with congestive heart failure and stage 2 kidney disease: is stable : b/p 92/52 will lower  lopressor to 12.5 mg daily due to poor tolerance   16. PAF( paroxymal atrial fibrillation) CHA2DS2- VASc score+6: heart rate is stable; will continue lopressor 25 mg twice daily for rate control and xarelto 15 mg daily   17.  Chronic diastolic heart failure: is stable will continue lasix 20 mg daily     MD is aware of resident's narcotic use and is in agreement with current plan of care. We will attempt to wean resident as apropriate   Ok Edwards NP [facility]  Contact  [phone] Monday through Friday 8am- 5pm  After hours call [phone]

## 2018-01-30 ENCOUNTER — Encounter: Payer: Self-pay | Admitting: Adult Health

## 2018-02-07 ENCOUNTER — Encounter: Payer: Self-pay | Admitting: Adult Health

## 2018-02-07 ENCOUNTER — Non-Acute Institutional Stay (SKILLED_NURSING_FACILITY): Payer: Medicare Other | Admitting: Adult Health

## 2018-02-07 DIAGNOSIS — I6930 Unspecified sequelae of cerebral infarction: Secondary | ICD-10-CM | POA: Diagnosis not present

## 2018-02-07 DIAGNOSIS — E1149 Type 2 diabetes mellitus with other diabetic neurological complication: Secondary | ICD-10-CM

## 2018-02-07 NOTE — Progress Notes (Signed)
Location:   [facility] Room Number: [place] of Service:  SNF (31)   CODE STATUS: DNR  Allergies  Allergen Reactions  . Amlodipine Cough  . Clopidogrel Bisulfate Cough  . Kenalog [Triamcinolone Acetonide] Other (See Comments)    unknown  . Lisinopril Cough  . Losartan Cough  . Sulfa Antibiotics Hives and Rash    Chief Complaint  Patient presents with  . Acute Visit    Crying, DM    HPI:  Staff report that her cbg readings are all elevated. She has had a change in status; is aware of her surroundings; but is unable to speak; she has increased weakness present. She does have a history of CVA this past year. This episode lasted 15 minutes and is slowly resolving. Afterward she has returned to her baseline cognition. She is unable to fully participate in the hpi or ros there are no reports of uncontrolled pain. She is able to eat and drink.    Past Medical History:  Diagnosis Date  . Abnormal nuclear stress test 06/21/2011   Inferolateral reversible defect;--> cardiac cath & PCI of CxOM, occluded RCA with collaterals;; followup Myoview 11/2012: Low risk. Fixed basal inferior artifact normal EF. No ischemia  . Anxiety   . Asthma   . Bilateral cataracts    Status post stroke or correction  . CAD S/P percutaneous coronary angioplasty 06/2011   s/P PCI to proximal OM1 w/ DES; occluded RCA with bridging and L-R collaterals (medical management)  . Chronic kidney disease (CKD), stage II (mild)    Related to current bladder infections (although diabetes cannot be excluded)  . Chronotropic incompetence with sinus node dysfunction [facility]) October 2013   On CPET test; beta blockers reduced  . Diabetes mellitus type 2, controlled ([place])    On oral medications  . Diverticulitis   . Essential hypertension    Allowing for permissive hypertension to avoid orthostatic hypotension  . GERD (gastroesophageal reflux disease)    On PPI  . Glaucoma   . History of  syncope    Per EP - neurocardiogenic & not Bradycardia related (no PPM)  . History of unstable angina 06/13/2011   Jaw pain awakening from sleep -- Myoview --CATH --> PCI  . Hyperlipidemia with target LDL less than 70    HDL at goal, LDL not at goal, borderline triglycerides. On Crestor 20 mg  . Migraines   . OSA (obstructive sleep apnea)    hx bladder infections  . Osteoarthritis   . PAF (paroxysmal atrial fibrillation) ([place]) 10-11/ 2014   CardioNet Event Monitor: NSR & S Brady -- Rates 50-100; Total A. fib burden 35 hours and 27 minutes. 1296 episodes, longest was 1 hour 29 minutes. Rate ranged from 52-169 beats per minute.  . Seasonal allergies   . Shortness of breath on exertion October 2013   2-D echo: Normal EF>55%, Gr 1DD, mild aortic sclerosis; Evaluated with CPET - peak VO2 97%; Chronotropic Incompetence (submaximal effort)  . Spinal stenosis of lumbar region 11/2011  . Stroke ([place]) 06/2017  . Stroke ([place]) 11/2014  . Tachycardia-bradycardia syndrome ([place]) 11/2012  . Urge incontinence     Past Surgical History:  Procedure Laterality Date  . APPENDECTOMY    . BREAST BIOPSY     both breast  . cataracts     both eyes  . COLONOSCOPY    . CORONARY ANGIOPLASTY WITH STENT PLACEMENT  07/05/2011   Promus Element DES 2.5 mm x 16  mm-post dilated to 2.65 mm CX-Proximal OM1  . Holter Monitor  04/2015   Sinus rhythm with rates 52-149 BPM. Isolated PACs with rare couplets and bigeminy. Multiple short runs of PAT/PSVT. Arrhythmia run 120 bpm for 204 beats. 14% of time was in A. fib/flutter. - Reviewed by Dr. Caryl Comes. Not thought to be significant enough for her syncope.  Marland Kitchen KNEE ARTHROSCOPY WITH MEDIAL MENISECTOMY Right 06/24/2014   Procedure: RIGHT KNEE ARTHROSCOPY WITH partial lateral MENISECTOMY, abrasion chondroplasty of medial femoral condyle and patella, microfracture technique;  Surgeon: Latanya Maudlin, MD;  Location: [facility] ORS;  Service: Orthopedics;  Laterality: Right;  . LEFT HEART  CATHETERIZATION WITH CORONARY ANGIOGRAM N/A 07/06/2011   Procedure: LEFT HEART CATHETERIZATION WITH CORONARY ANGIOGRAM;  Surgeon: Leonie Man, MD;  Location: [facility] CATH LAB: Unstable Angina --> Myoview wiht Inf-Lat Ischemia.  Proximal OM1 lesion --> PCI; 100% mid RCA with right to right and left to right bridging collaterals from circumflex RPL and LAD the PDA.  Marland Kitchen NM MYOVIEW LTD  October 2014    Low risk. Fixed basal inferior artifact normal EF. No ischemia --> (as compared to pre-PCI Myoview revealing inferolateral ischemia)  . rotator cuff Right   . TRANSTHORACIC ECHOCARDIOGRAM  11/2014   EF 65-70%, Mod LVH. Normal wall motion. Gr 1 DD. Normal valves.  Marland Kitchen VAGINAL HYSTERECTOMY      Social History   Socioeconomic History  . Marital status: Widowed    Spouse name: Not on file  . Number of children: Not on file  . Years of education: Not on file  . Highest education level: Not on file  Occupational History  . Occupation: retired  Scientific laboratory technician  . Financial resource strain: Not hard at all  . Food insecurity:    Worry: Patient refused    Inability: Patient refused  . Transportation needs:    Medical: Patient refused    Non-medical: Patient refused  Tobacco Use  . Smoking status: Never Smoker  . Smokeless tobacco: Never Used  Substance and Sexual Activity  . Alcohol use: No  . Drug use: No  . Sexual activity: Never    Birth control/protection: Post-menopausal  Lifestyle  . Physical activity:    Days per week: Patient refused    Minutes per session: Patient refused  . Stress: Only a little  Relationships  . Social connections:    Talks on phone: Patient refused    Gets together: Patient refused    Attends religious service: Patient refused    Active member of club or organization: Patient refused    Attends meetings of clubs or organizations: Patient refused    Relationship status: Patient refused  . Intimate partner violence:    Fear of current or ex partner: Patient  refused    Emotionally abused: Patient refused    Physically abused: Patient refused    Forced sexual activity: Patient refused  Other Topics Concern  . Not on file  Social History Narrative   Widowed mother of one, grandmother 2. Has not been using silver take as much lately due to knee discomfort. Usually exercises with Silver Sneakers on regular basis.   Family History  Problem Relation Age of Onset  . COPD Mother   . Pulmonary fibrosis Mother   . Heart attack Father   . Heart disease Father   . Stroke Father   . Cancer Sister   . Cancer Sister   . Clotting disorder Sister   . Heart attack Sister   . Arthritis Other   .  Breast cancer Sister   . Colon cancer Neg Hx   . Esophageal cancer Neg Hx   . Stomach cancer Neg Hx       VITAL SIGNS Temp (!) 97.5 F (36.4 C)   Resp 12   Ht 5\' 4"  (1.626 m)   Wt 166 lb 8 oz (75.5 kg)   SpO2 95%   BMI 28.58 kg/m   Outpatient Encounter Medications as of 02/07/2018  Medication Sig  . acetaminophen (TYLENOL) 650 MG CR tablet Take 650 mg by mouth every 6 (six) hours as needed for pain.  Marland Kitchen azelastine (ASTELIN) 0.1 % nasal spray Place 2 sprays into both nostrils at bedtime. Use in each nostril as directed   . bisacodyl (DULCOLAX) 10 MG suppository Place 10 mg rectally daily as needed for moderate constipation.  . docusate sodium (STOOL SOFTENER) 100 MG capsule Take 100 mg by mouth daily.   Marland Kitchen FLUoxetine (PROZAC) 20 MG tablet Take 20 mg by mouth daily.  . furosemide (LASIX) 20 MG tablet Take 20 mg by mouth daily.  Marland Kitchen gabapentin (NEURONTIN) 300 MG capsule Take 300 mg by mouth 3 (three) times daily.   Marland Kitchen glucagon (GLUCAGON EMERGENCY) 1 MG injection Inject 1 mg into the vein once as needed. Give 1 mg IM for Hypoglycemia (symptomatic) not responding to oral glucose, snack. May repeat x 1 after 15 minutes. USE ONLY IF PT IS OR IS BECOMING UNRESPONSIVE  . GLUCERNA (GLUCERNA) LIQD Take 237 mLs by mouth 2 (two) times daily between meals.  Marland Kitchen HUMALOG  KWIKPEN 100 UNIT/ML KiwkPen Inject 22 Units into the skin 3 (three) times daily with meals. DM2. Hold for CBG < 200  . Infant Care Products [facility] EX) Apply liberal amount topically to area of skin irritation prn. OK to leave at bedside.  . Insulin Glargine (TOUJEO MAX SOLOSTAR) 300 UNIT/ML SOPN Inject 50 Units into the skin at bedtime. Alternate abdominal injection sites.  . latanoprost (XALATAN) 0.005 % ophthalmic solution Place 1 drop into both eyes daily.   Marland Kitchen levETIRAcetam (KEPPRA) 500 MG tablet Take 1 tablet (500 mg total) by mouth 2 (two) times daily.  Marland Kitchen levocetirizine (XYZAL) 5 MG tablet Take 2.5 mg by mouth daily.   . Lidocaine-Menthol (ICY HOT LIDOCAINE PLUS MENTHOL) 4-1 % PTCH Apply 1 patch topically daily. apply to lower back in the a.m. for pain management  . Magnesium 500 MG CAPS Take 500 mg by mouth at bedtime.   . metoprolol tartrate (LOPRESSOR) 25 MG tablet Take 12.5 mg by mouth daily.   . mirtazapine (REMERON SOL-TAB) 15 MG disintegrating tablet Take 0.5 tablets (7.5 mg total) by mouth at bedtime.  . nitroGLYCERIN (NITROSTAT) 0.4 MG SL tablet Place 0.4 mg under the tongue every 5 (five) minutes as needed for chest pain.  . NON FORMULARY Diet Order: Upgrade patient to dysphagia 2 consistencies (ground), continue thin liquids; Straws ok  . Nutritional Supplements (NUTRITIONAL SUPPLEMENT PO) Snack at bedtime  . omeprazole (PRILOSEC) 40 MG capsule Take 40 mg by mouth daily.  Marland Kitchen oxybutynin (DITROPAN) 5 MG tablet Take 5 mg by mouth 2 (two) times daily.  . OXYGEN Inhale 2 L/min into the lungs 2 (two) times daily as needed.  . polyethylene glycol (MIRALAX / GLYCOLAX) packet Take 17 g by mouth daily as needed for moderate constipation.  . Rivaroxaban (XARELTO) 15 MG TABS tablet Take 15 mg by mouth every evening.   Marland Kitchen rOPINIRole (REQUIP) 0.5 MG tablet TAKE 1 TABLET BY MOUTH AT  BEDTIME  .  Skin Protectants, Misc. (ENDIT EX) Apply topically to buttock three times daily  . timolol  (TIMOPTIC) 0.5 % ophthalmic solution Place 1 drop into both eyes at bedtime.   . traMADol (ULTRAM) 50 MG tablet Take by mouth every 12 (twelve) hours.  . trolamine salicylate (ASPERCREME) 10 % cream Apply 1 application topically 3 (three) times daily. Apply to right knee   No facility-administered encounter medications on file as of 02/07/2018.      SIGNIFICANT DIAGNOSTIC EXAMS  PREVIOUS;  12-21-17: ct of head:  1. No acute intracranial abnormality identified. 2. ASPECTS is 10. 3. Stable chronic microvascular ischemic changes, volume loss, and small chronic infarcts of the basal ganglia. 4. Chronic infarct of the left lentiform nucleus, caudate body, and corona radiata corresponding to the prior acute infarction on 07/22/2017 MRI of the brain. 5. Sphenoid sinus opacification with fluid level, progressed from prior study, compatible with acute sinusitis in the appropriate clinical setting.  12-21-17: MRI of brain: 1. Motion degraded examination.  No acute intracranial process. 2. Multiple old small supra- and infratentorial infarcts. 3. Moderate to severe parenchymal brain volume loss.  12-22-17: chest x-ray: No active disease.  NO NEW EXAMS.     LABS REVIEWED: PREVIOUS  07-21-17: wbc 10.1; hgb 13.8; hct 43.8; mcv 94.5; plt 323 glucose 221; bun 18; creat 1.21; k+ 4.2; na++ 136; ca 9.7 liver normal albumin 3.6  07-22-17: hgb a1c 7.8 tsh 4.194 vit B 12: 342 07-24-17: wbc 10.5; hgb 12.9; hct 39.5; mcv 93.8 plt 331; glucose 121 bun 18; creat 1.13;  4.1; na++ 144; ca 9.4 chol 234; ldl 157; trig 218; hdl 33  10-09-17: urine micro-albumin: 5.9 10-25-17: mag 2.1  10-31-17: wbc 10.2; hgb 11.1; hct 32.5 ;mcv 93.8; plt 309 glucose 170; bun 19; creat 1.02; k+ 4.1; na++138; ca 9.1; liver normal albumin 3.2 urine culture: multiple species  11-08-17: urine culture: e-coli  12-21-17: wbc 10.4; hgb 11.5; hct 36.8; mcv 100.3; plt 317; glucose 87; bun 14; creat 1.22; k+ 3.9; na++ 140; ca 9.7; liver normal  albumin 3.1  12-22-17: tsh 3.223 12-24-17: wbc 13.6; hgb 12.3; hct 37.6; mcv 95.9; plt 371; glucose 101; bun 7; creat 0.85; k+ 3.3; na++ 140; ca 9.6  12-16-17: wbc 12.0; gb 12.4; hct 38.5; mcv 98.0; plt 342; glucose 194; bun 14; creat 0.93; k+ 3.7; na++ 142; ca 9.5 01-03-18: wbc  9.7; hgb 11.6; hct 36.0; mcv 98.9 plt 327; glucose 90; bun 18; creat 1.05; k+ 3.4; na++ 144; ca 9.4 01-14-18; glucose 65; bun 22; creat 1.06; k+ 4.0; na++ 143; ca 9.0 01-17-18:  Vit D 38.1;  Vit B 12: 321  NO NEW LABS.    Review of Systems  Reason unable to perform ROS: confusion    Physical Exam Constitutional:      General: She is not in acute distress.    Appearance: Normal appearance. She is well-developed. She is obese. She is not diaphoretic.  Neck:     Musculoskeletal: No neck rigidity.     Thyroid: Thyromegaly present.     Comments: Mild  Cardiovascular:     Rate and Rhythm: Normal rate and regular rhythm.     Pulses: Normal pulses.     Heart sounds: Normal heart sounds.  Pulmonary:     Effort: Pulmonary effort is normal. No respiratory distress.     Breath sounds: Normal breath sounds.  Abdominal:     General: Bowel sounds are normal. There is no distension.     Palpations: Abdomen is  soft.     Tenderness: There is no abdominal tenderness.  Musculoskeletal:     Right lower leg: No edema.     Left lower leg: No edema.     Comments: Has generalized weakness present   Lymphadenopathy:     Cervical: No cervical adenopathy.  Skin:    General: Skin is warm and dry.  Neurological:     Mental Status: She is alert. Mental status is at baseline.     ASSESSMENT/ PLAN:  TODAY:   1.  Diabetes mellitus type 2 with neurological manifestations: is cbgs are uncontrolled hgb a1c 7.8: will change to: toujeo 55 units nightly and humalog 28 units with meals for cbg >150  2. Acute CVA: is neurologically without change will continue xarelto 15 mg daily more than likely this does represent a TIA.      MD is aware of resident's narcotic use and is in agreement with current plan of care. We will attempt to wean resident as apropriate   Ok Edwards NP [facility]  Contact [phone] Monday through Friday 8am- 5pm  After hours call [phone]

## 2018-02-21 ENCOUNTER — Other Ambulatory Visit: Payer: Self-pay | Admitting: Adult Health

## 2018-02-21 MED ORDER — TRAMADOL HCL 50 MG PO TABS: 50.0000 mg | ORAL_TABLET | Freq: Three times a day (TID) | ORAL | 0 refills | Status: DC

## 2018-02-22 ENCOUNTER — Non-Acute Institutional Stay: Payer: Medicare Other | Admitting: Nurse Practitioner

## 2018-02-22 ENCOUNTER — Encounter: Payer: Self-pay | Admitting: Nurse Practitioner

## 2018-02-22 VITALS — HR 68 | Resp 18 | Wt 166.8 lb

## 2018-02-22 DIAGNOSIS — Z515 Encounter for palliative care: Secondary | ICD-10-CM | POA: Diagnosis not present

## 2018-02-22 DIAGNOSIS — R413 Other amnesia: Secondary | ICD-10-CM | POA: Diagnosis not present

## 2018-02-22 DIAGNOSIS — R5381 Other malaise: Secondary | ICD-10-CM | POA: Insufficient documentation

## 2018-02-22 NOTE — Progress Notes (Signed)
[facility] Telephone: [phone] Fax: [phone]  PATIENT NAME: Abigail Boyd DOB: Jul 14, 1929 MRN: [id number]  PRIMARY CARE PROVIDER:   Leone Haven, MD  REFERRING PROVIDER:  Dr Ouida Sills; [place]:   Daughter POA Dois Davenport [phone] or [phone]  ASSESSMENT:     I visited and observed Ms Friley. We talked about purpose of palliative medicine visit. She smiled and said she remembered provider. We talked about symptoms of pain but she denies. We talked about her appetite. We talked about her functional-level and praised her for being out of bed. Limited verbal discussion as her speech is difficult to understand and limited with cognitive impairment. I talked with her brother Joneen Boers no new recent changes or concerns. We talked about her functional and cognitive impairment. I have attempted to contact her daughter Elmo Putt, unable to leave a message. I updated nursing staff she continues to appear stable. DNR remains in place. Medical goals with focus more conservative as per previous palliative care discussions. Therapeutic listening and emotional support provided. I updated nursing staff have no new changes to current goals or plan of care.  10 / 25 / 2019 wvc 10.4, hemoglobin 11.5, hematocrit 36.8, platelets 317, sodium 140, potassium 3.9, calcium 9.7, bun 14, creatinine 1.22, glucose 87, albumin 3.1  11/18 / 2019 sodium 144, potassium 3.4, calcium 9.0, bun 22, creatinine 1.06   RECOMMENDATIONS and PLAN:   1.Palliative care encounter Z51.5; Palliative medicine team will continue to support patient, patient's family, and medical team. Visit consisted of counseling and education dealing with the complex and emotionally intense issues of symptom management and palliative care in the setting of serious and potentially life-threatening illness  2. Debility R53.81 secondary to late onset CVA progressive, encourage passive rom; encourage to  transfer to geri-chair.   3. Memory loss R41.3 secondary to late onset CVA appears progressive. Medical goals to continue to focus on Comfort, redirecting with supportive measure  I spent 60 minutes providing this consultation,  from 12:30pm to 1:30pm. More than 50% of the time in this consultation was spent coordinating communication.   HISTORY OF PRESENT ILLNESS:  Abigail Boyd is a 84 y.o. year old female with multiple medical problems includingCoronary artery disease s/p angioplasty, congestive heart failure, dysphagia, seizure disorder, paroxysmal atrial fibrillation, late onset stroke 2016/2018, diabetic neuropathy, diabetes, Tachy-brady syndrome, chronic kidney disease, hypertension, diverticulitis, gerd, asthma, stenosis of lumbar region, vitamin D deficiency, osteoarthritis, obstructive sleep apnea, glaucoma, bilateral cataracts s/p sgy, history of migraines, allergies, history of urge incontinence, vaginal hysterectomy and, right rotator cuff repair, right knee arthroplasty with medical meniscectomy, appendectomy. She continues to reside in skilled [place].  She is a lift to the chair and does sit up with  positioning. She is total ADL dependent with incontinence. She does require assistance with feeding and blood sugars have been mildly elevated though she has been receiving Glucerna which has required her to get insulin coverage. Last primary provider visit 12/12 / 2019 for elevated blood sugar readings. Hemoglobin A1c 7.8 change to toujeo 55 units qhs and Humalog 28 units with meals for blood sugar is greater than 150. Acute CVA neurological without change continued Xarelto 15 mg daily. Staff reports no recent Falls, infections come off positions. She is a DNR. At present she is sitting in the recliner in her room. She appears debilitated but comfortable. Her brother is with her, Joneen Boers . Palliative Care was asked to help address goals of care.  CODE STATUS: DNR  PPS:  40% HOSPICE ELIGIBILITY/DIAGNOSIS: TBD  PAST MEDICAL HISTORY:  Past Medical History:  Diagnosis Date  . Abnormal nuclear stress test 06/21/2011   Inferolateral reversible defect;--> cardiac cath & PCI of CxOM, occluded RCA with collaterals;; followup Myoview 11/2012: Low risk. Fixed basal inferior artifact normal EF. No ischemia  . Anxiety   . Asthma   . Bilateral cataracts    Status post stroke or correction  . CAD S/P percutaneous coronary angioplasty 06/2011   s/P PCI to proximal OM1 w/ DES; occluded RCA with bridging and L-R collaterals (medical management)  . Chronic kidney disease (CKD), stage II (mild)    Related to current bladder infections (although diabetes cannot be excluded)  . Chronotropic incompetence with sinus node dysfunction [facility]) October 2013   On CPET test; beta blockers reduced  . Diabetes mellitus type 2, controlled ([place])    On oral medications  . Diverticulitis   . Essential hypertension    Allowing for permissive hypertension to avoid orthostatic hypotension  . GERD (gastroesophageal reflux disease)    On PPI  . Glaucoma   . History of syncope    Per EP - neurocardiogenic & not Bradycardia related (no PPM)  . History of unstable angina 06/13/2011   Jaw pain awakening from sleep -- Myoview --CATH --> PCI  . Hyperlipidemia with target LDL less than 70    HDL at goal, LDL not at goal, borderline triglycerides. On Crestor 20 mg  . Migraines   . OSA (obstructive sleep apnea)    hx bladder infections  . Osteoarthritis   . PAF (paroxysmal atrial fibrillation) ([place]) 10-11/ 2014   CardioNet Event Monitor: NSR & S Brady -- Rates 50-100; Total A. fib burden 35 hours and 27 minutes. 1296 episodes, longest was 1 hour 29 minutes. Rate ranged from 52-169 beats per minute.  . Seasonal allergies   . Shortness of breath on exertion October 2013   2-D echo: Normal EF>55%, Gr 1DD, mild aortic sclerosis; Evaluated with CPET - peak VO2 97%; Chronotropic Incompetence  (submaximal effort)  . Spinal stenosis of lumbar region 11/2011  . Stroke ([place]) 06/2017  . Stroke ([place]) 11/2014  . Tachycardia-bradycardia syndrome ([place]) 11/2012  . Urge incontinence     SOCIAL HX:  Social History   Tobacco Use  . Smoking status: Never Smoker  . Smokeless tobacco: Never Used  Substance Use Topics  . Alcohol use: No    ALLERGIES:  Allergies  Allergen Reactions  . Amlodipine Cough  . Clopidogrel Bisulfate Cough  . Kenalog [Triamcinolone Acetonide] Other (See Comments)    unknown  . Lisinopril Cough  . Losartan Cough  . Sulfa Antibiotics Hives and Rash     PERTINENT MEDICATIONS:  Outpatient Encounter Medications as of 02/22/2018  Medication Sig  . acetaminophen (TYLENOL) 650 MG CR tablet Take 650 mg by mouth every 6 (six) hours as needed for pain.  Marland Kitchen azelastine (ASTELIN) 0.1 % nasal spray Place 2 sprays into both nostrils at bedtime. Use in each nostril as directed   . bisacodyl (DULCOLAX) 10 MG suppository Place 10 mg rectally daily as needed for moderate constipation.  . docusate sodium (STOOL SOFTENER) 100 MG capsule Take 100 mg by mouth daily.   Marland Kitchen FLUoxetine (PROZAC) 20 MG tablet Take 20 mg by mouth daily.  . furosemide (LASIX) 20 MG tablet Take 20 mg by mouth daily.  Marland Kitchen gabapentin (NEURONTIN) 300 MG capsule Take 300 mg by mouth 3 (three) times daily.   Marland Kitchen  glucagon (GLUCAGON EMERGENCY) 1 MG injection Inject 1 mg into the vein once as needed. Give 1 mg IM for Hypoglycemia (symptomatic) not responding to oral glucose, snack. May repeat x 1 after 15 minutes. USE ONLY IF PT IS OR IS BECOMING UNRESPONSIVE  . GLUCERNA (GLUCERNA) LIQD Take 237 mLs by mouth 2 (two) times daily between meals.  Marland Kitchen HUMALOG KWIKPEN 100 UNIT/ML KiwkPen Inject 22 Units into the skin 3 (three) times daily with meals. DM2. Hold for CBG < 200  . Infant Care Products [facility] EX) Apply liberal amount topically to area of skin irritation prn. OK to leave at bedside.  . Insulin Glargine  (TOUJEO MAX SOLOSTAR) 300 UNIT/ML SOPN Inject 50 Units into the skin at bedtime. Alternate abdominal injection sites.  . latanoprost (XALATAN) 0.005 % ophthalmic solution Place 1 drop into both eyes daily.   Marland Kitchen levETIRAcetam (KEPPRA) 500 MG tablet Take 1 tablet (500 mg total) by mouth 2 (two) times daily.  Marland Kitchen levocetirizine (XYZAL) 5 MG tablet Take 2.5 mg by mouth daily.   . Lidocaine-Menthol (ICY HOT LIDOCAINE PLUS MENTHOL) 4-1 % PTCH Apply 1 patch topically daily. apply to lower back in the a.m. for pain management  . Magnesium 500 MG CAPS Take 500 mg by mouth at bedtime.   . metoprolol tartrate (LOPRESSOR) 25 MG tablet Take 12.5 mg by mouth daily.   . mirtazapine (REMERON SOL-TAB) 15 MG disintegrating tablet Take 0.5 tablets (7.5 mg total) by mouth at bedtime.  . nitroGLYCERIN (NITROSTAT) 0.4 MG SL tablet Place 0.4 mg under the tongue every 5 (five) minutes as needed for chest pain.  . NON FORMULARY Diet Order: Upgrade patient to dysphagia 2 consistencies (ground), continue thin liquids; Straws ok  . Nutritional Supplements (NUTRITIONAL SUPPLEMENT PO) Snack at bedtime  . omeprazole (PRILOSEC) 40 MG capsule Take 40 mg by mouth daily.  Marland Kitchen oxybutynin (DITROPAN) 5 MG tablet Take 5 mg by mouth 2 (two) times daily.  . OXYGEN Inhale 2 L/min into the lungs 2 (two) times daily as needed.  . polyethylene glycol (MIRALAX / GLYCOLAX) packet Take 17 g by mouth daily as needed for moderate constipation.  . Rivaroxaban (XARELTO) 15 MG TABS tablet Take 15 mg by mouth every evening.   Marland Kitchen rOPINIRole (REQUIP) 0.5 MG tablet TAKE 1 TABLET BY MOUTH AT  BEDTIME  . Skin Protectants, Misc. (ENDIT EX) Apply topically to buttock three times daily  . timolol (TIMOPTIC) 0.5 % ophthalmic solution Place 1 drop into both eyes at bedtime.   . traMADol (ULTRAM) 50 MG tablet Take 1 tablet (50 mg total) by mouth every 8 (eight) hours.  . trolamine salicylate (ASPERCREME) 10 % cream Apply 1 application topically 3 (three) times  daily. Apply to right knee   No facility-administered encounter medications on file as of 02/22/2018.     PHYSICAL EXAM:   General: obese, debilitated, chronically ill female Cardiovascular: regular rate and rhythm Pulmonary: clear ant fields Abdomen: soft, nontender, + bowel sounds GU: no suprapubic tenderness Extremities: +BLE mild edema, no joint deformities Skin: no rashes Neurological: functional quadripelgic  Winfred Redel Z Saraiah Bhat, NP

## 2018-02-25 DIAGNOSIS — Z789 Other specified health status: Secondary | ICD-10-CM | POA: Diagnosis not present

## 2018-02-25 DIAGNOSIS — I25119 Atherosclerotic heart disease of native coronary artery with unspecified angina pectoris: Secondary | ICD-10-CM | POA: Diagnosis not present

## 2018-02-25 DIAGNOSIS — I4891 Unspecified atrial fibrillation: Secondary | ICD-10-CM | POA: Diagnosis not present

## 2018-02-25 DIAGNOSIS — E7800 Pure hypercholesterolemia, unspecified: Secondary | ICD-10-CM | POA: Diagnosis not present

## 2018-02-25 DIAGNOSIS — I69320 Aphasia following cerebral infarction: Secondary | ICD-10-CM | POA: Diagnosis not present

## 2018-02-27 ENCOUNTER — Encounter
Admission: RE | Admit: 2018-02-27 | Discharge: 2018-02-27 | Disposition: A | Discharge status: Home / Self Care | Payer: Medicare Other | Source: Ambulatory Visit | Attending: Internal Medicine | Admitting: Internal Medicine

## 2018-02-28 ENCOUNTER — Encounter: Payer: Self-pay | Admitting: Adult Health

## 2018-02-28 ENCOUNTER — Non-Acute Institutional Stay (SKILLED_NURSING_FACILITY): Payer: Medicare Other | Admitting: Adult Health

## 2018-02-28 DIAGNOSIS — E1149 Type 2 diabetes mellitus with other diabetic neurological complication: Secondary | ICD-10-CM

## 2018-02-28 DIAGNOSIS — N3941 Urge incontinence: Secondary | ICD-10-CM

## 2018-02-28 DIAGNOSIS — G4761 Periodic limb movement disorder: Secondary | ICD-10-CM

## 2018-02-28 DIAGNOSIS — H401134 Primary open-angle glaucoma, bilateral, indeterminate stage: Secondary | ICD-10-CM

## 2018-02-28 NOTE — Progress Notes (Signed)
Location:   [facility] Room Number: [place] of Service:  SNF (31)   CODE STATUS: DNR  Allergies  Allergen Reactions  . Amlodipine Cough  . Clopidogrel Bisulfate Cough  . Kenalog [Triamcinolone Acetonide] Other (See Comments)    unknown  . Lisinopril Cough  . Losartan Cough  . Sulfa Antibiotics Hives and Rash    Chief Complaint  Patient presents with  . Medical Management of Chronic Issues    Diabetes mellitus type 2 with neurological manifestations; primary open angle glaucoma of both eyes indeterminate stage; PLMD; urge incontinence     HPI:  She is a 84 year old long term resident of this facility being seen for the management of her chronic illnesses: diabetes; glaucoma; PLMD: incontinence.  There are no reports of uncontrolled pain; no changes in appetite; no insomnia or depressive thoughts. Her cbg's are elevated.   Past Medical History:  Diagnosis Date  . Abnormal nuclear stress test 06/21/2011   Inferolateral reversible defect;--> cardiac cath & PCI of CxOM, occluded RCA with collaterals;; followup Myoview 11/2012: Low risk. Fixed basal inferior artifact normal EF. No ischemia  . Anxiety   . Asthma   . Bilateral cataracts    Status post stroke or correction  . CAD S/P percutaneous coronary angioplasty 06/2011   s/P PCI to proximal OM1 w/ DES; occluded RCA with bridging and L-R collaterals (medical management)  . Chronic kidney disease (CKD), stage II (mild)    Related to current bladder infections (although diabetes cannot be excluded)  . Chronotropic incompetence with sinus node dysfunction [facility]) October 2013   On CPET test; beta blockers reduced  . Diabetes mellitus type 2, controlled ([place])    On oral medications  . Diverticulitis   . Essential hypertension    Allowing for permissive hypertension to avoid orthostatic hypotension  . GERD (gastroesophageal reflux disease)    On PPI  . Glaucoma   . History of syncope    Per EP  - neurocardiogenic & not Bradycardia related (no PPM)  . History of unstable angina 06/13/2011   Jaw pain awakening from sleep -- Myoview --CATH --> PCI  . Hyperlipidemia with target LDL less than 70    HDL at goal, LDL not at goal, borderline triglycerides. On Crestor 20 mg  . Migraines   . OSA (obstructive sleep apnea)    hx bladder infections  . Osteoarthritis   . PAF (paroxysmal atrial fibrillation) ([place]) 10-11/ 2014   CardioNet Event Monitor: NSR & S Brady -- Rates 50-100; Total A. fib burden 35 hours and 27 minutes. 1296 episodes, longest was 1 hour 29 minutes. Rate ranged from 52-169 beats per minute.  . Seasonal allergies   . Shortness of breath on exertion October 2013   2-D echo: Normal EF>55%, Gr 1DD, mild aortic sclerosis; Evaluated with CPET - peak VO2 97%; Chronotropic Incompetence (submaximal effort)  . Spinal stenosis of lumbar region 11/2011  . Stroke ([place]) 06/2017  . Stroke ([place]) 11/2014  . Tachycardia-bradycardia syndrome ([place]) 11/2012  . Urge incontinence     Past Surgical History:  Procedure Laterality Date  . APPENDECTOMY    . BREAST BIOPSY     both breast  . cataracts     both eyes  . COLONOSCOPY    . CORONARY ANGIOPLASTY WITH STENT PLACEMENT  07/05/2011   Promus Element DES 2.5 mm x 16 mm-post dilated to 2.65 mm CX-Proximal OM1  . Holter Monitor  04/2015   Sinus rhythm  with rates 52-149 BPM. Isolated PACs with rare couplets and bigeminy. Multiple short runs of PAT/PSVT. Arrhythmia run 120 bpm for 204 beats. 14% of time was in A. fib/flutter. - Reviewed by Dr. Caryl Comes. Not thought to be significant enough for her syncope.  Marland Kitchen KNEE ARTHROSCOPY WITH MEDIAL MENISECTOMY Right 06/24/2014   Procedure: RIGHT KNEE ARTHROSCOPY WITH partial lateral MENISECTOMY, abrasion chondroplasty of medial femoral condyle and patella, microfracture technique;  Surgeon: Latanya Maudlin, MD;  Location: [facility];  Service: Orthopedics;  Laterality: Right;  . LEFT HEART CATHETERIZATION WITH  CORONARY ANGIOGRAM N/A 07/06/2011   Procedure: LEFT HEART CATHETERIZATION WITH CORONARY ANGIOGRAM;  Surgeon: Leonie Man, MD;  Location: [facility] CATH LAB: Unstable Angina --> Myoview wiht Inf-Lat Ischemia.  Proximal OM1 lesion --> PCI; 100% mid RCA with right to right and left to right bridging collaterals from circumflex RPL and LAD the PDA.  Marland Kitchen NM MYOVIEW LTD  October 2014    Low risk. Fixed basal inferior artifact normal EF. No ischemia --> (as compared to pre-PCI Myoview revealing inferolateral ischemia)  . rotator cuff Right   . TRANSTHORACIC ECHOCARDIOGRAM  11/2014   EF 65-70%, Mod LVH. Normal wall motion. Gr 1 DD. Normal valves.  Marland Kitchen VAGINAL HYSTERECTOMY      Social History   Socioeconomic History  . Marital status: Widowed    Spouse name: Not on file  . Number of children: Not on file  . Years of education: Not on file  . Highest education level: Not on file  Occupational History  . Occupation: retired  Scientific laboratory technician  . Financial resource strain: Not hard at all  . Food insecurity:    Worry: Patient refused    Inability: Patient refused  . Transportation needs:    Medical: Patient refused    Non-medical: Patient refused  Tobacco Use  . Smoking status: Never Smoker  . Smokeless tobacco: Never Used  Substance and Sexual Activity  . Alcohol use: No  . Drug use: No  . Sexual activity: Never    Birth control/protection: Post-menopausal  Lifestyle  . Physical activity:    Days per week: Patient refused    Minutes per session: Patient refused  . Stress: Only a little  Relationships  . Social connections:    Talks on phone: Patient refused    Gets together: Patient refused    Attends religious service: Patient refused    Active member of club or organization: Patient refused    Attends meetings of clubs or organizations: Patient refused    Relationship status: Patient refused  . Intimate partner violence:    Fear of current or ex partner: Patient refused    Emotionally  abused: Patient refused    Physically abused: Patient refused    Forced sexual activity: Patient refused  Other Topics Concern  . Not on file  Social History Narrative   Widowed mother of one, grandmother 2. Has not been using silver take as much lately due to knee discomfort. Usually exercises with Silver Sneakers on regular basis.   Family History  Problem Relation Age of Onset  . COPD Mother   . Pulmonary fibrosis Mother   . Heart attack Father   . Heart disease Father   . Stroke Father   . Cancer Sister   . Cancer Sister   . Clotting disorder Sister   . Heart attack Sister   . Arthritis Other   . Breast cancer Sister   . Colon cancer Neg Hx   . Esophageal  cancer Neg Hx   . Stomach cancer Neg Hx       VITAL SIGNS BP (!) 143/79   Pulse (!) 108   Temp (!) 97.4 F (36.3 C)   Resp (!) 24   Ht 5\' 4"  (1.626 m)   Wt 166 lb 8 oz (75.5 kg)   SpO2 95%   BMI 28.58 kg/m   Outpatient Encounter Medications as of 02/28/2018  Medication Sig  . acetaminophen (TYLENOL) 650 MG CR tablet Take 650 mg by mouth every 6 (six) hours as needed for pain.  Marland Kitchen azelastine (ASTELIN) 0.1 % nasal spray Place 2 sprays into both nostrils at bedtime. Use in each nostril as directed   . bisacodyl (DULCOLAX) 10 MG suppository Place 10 mg rectally daily as needed for moderate constipation.  . docusate sodium (STOOL SOFTENER) 100 MG capsule Take 100 mg by mouth daily.   Marland Kitchen FLUoxetine (PROZAC) 20 MG tablet Take 20 mg by mouth daily.  . furosemide (LASIX) 20 MG tablet Take 20 mg by mouth daily.  Marland Kitchen gabapentin (NEURONTIN) 300 MG capsule Take 300 mg by mouth 3 (three) times daily.   Marland Kitchen glucagon (GLUCAGON EMERGENCY) 1 MG injection Inject 1 mg into the vein once as needed. Give 1 mg IM for Hypoglycemia (symptomatic) not responding to oral glucose, snack. May repeat x 1 after 15 minutes. USE ONLY IF PT IS OR IS BECOMING UNRESPONSIVE  . GLUCERNA (GLUCERNA) LIQD Take 237 mLs by mouth 2 (two) times daily between meals.   Marland Kitchen HUMALOG KWIKPEN 100 UNIT/ML KiwkPen Inject 28 Units into the skin 3 (three) times daily with meals. DM2. Hold for CBG < 200  . Infant Care Products [facility] EX) Apply liberal amount topically to area of skin irritation prn. OK to leave at bedside.  . Insulin Glargine (TOUJEO MAX SOLOSTAR) 300 UNIT/ML SOPN Inject 55 Units into the skin at bedtime. Alternate abdominal injection sites.  . latanoprost (XALATAN) 0.005 % ophthalmic solution Place 1 drop into both eyes daily.   Marland Kitchen levETIRAcetam (KEPPRA) 500 MG tablet Take 1 tablet (500 mg total) by mouth 2 (two) times daily.  Marland Kitchen levocetirizine (XYZAL) 5 MG tablet Take 2.5 mg by mouth daily.   . Lidocaine-Menthol (ICY HOT LIDOCAINE PLUS MENTHOL) 4-1 % PTCH Apply 1 patch topically daily. apply to lower back in the a.m. for pain management  . Magnesium 500 MG CAPS Take 500 mg by mouth at bedtime.   . metoprolol tartrate (LOPRESSOR) 25 MG tablet Take 12.5 mg by mouth daily.   . mirtazapine (REMERON SOL-TAB) 15 MG disintegrating tablet Take 0.5 tablets (7.5 mg total) by mouth at bedtime.  . nitroGLYCERIN (NITROSTAT) 0.4 MG SL tablet Place 0.4 mg under the tongue every 5 (five) minutes as needed for chest pain.  . NON FORMULARY Diet Order: Upgrade patient to dysphagia 2 consistencies (ground), continue thin liquids; Straws ok  . Nutritional Supplements (NUTRITIONAL SUPPLEMENT PO) Snack at bedtime  . omeprazole (PRILOSEC) 40 MG capsule Take 40 mg by mouth daily.  Marland Kitchen oxybutynin (DITROPAN) 5 MG tablet Take 5 mg by mouth 2 (two) times daily.  . OXYGEN Inhale 2 L/min into the lungs as needed.   . polyethylene glycol (MIRALAX / GLYCOLAX) packet Give 17 gm by three times weekly on Monday, Wednesday and Friday  . Rivaroxaban (XARELTO) 15 MG TABS tablet Take 15 mg by mouth every evening.   Marland Kitchen rOPINIRole (REQUIP) 0.5 MG tablet TAKE 1 TABLET BY MOUTH AT  BEDTIME  . Skin Protectants, Misc. (ENDIT EX)  Apply topically to buttock three times daily as needed  . timolol  (TIMOPTIC) 0.5 % ophthalmic solution Place 1 drop into both eyes at bedtime.   . traMADol (ULTRAM) 50 MG tablet Take 50 mg by mouth every 12 (twelve) hours.  . trolamine salicylate (ASPERCREME) 10 % cream Apply 1 application topically 3 (three) times daily. Apply to right knee   No facility-administered encounter medications on file as of 02/28/2018.      SIGNIFICANT DIAGNOSTIC EXAMS  PREVIOUS;  12-21-17: ct of head:  1. No acute intracranial abnormality identified. 2. ASPECTS is 10. 3. Stable chronic microvascular ischemic changes, volume loss, and small chronic infarcts of the basal ganglia. 4. Chronic infarct of the left lentiform nucleus, caudate body, and corona radiata corresponding to the prior acute infarction on 07/22/2017 MRI of the brain. 5. Sphenoid sinus opacification with fluid level, progressed from prior study, compatible with acute sinusitis in the appropriate clinical setting.  12-21-17: MRI of brain: 1. Motion degraded examination.  No acute intracranial process. 2. Multiple old small supra- and infratentorial infarcts. 3. Moderate to severe parenchymal brain volume loss.  12-22-17: chest x-ray: No active disease.  NO NEW EXAMS.     LABS REVIEWED: PREVIOUS  07-21-17: wbc 10.1; hgb 13.8; hct 43.8; mcv 94.5; plt 323 glucose 221; bun 18; creat 1.21; k+ 4.2; na++ 136; ca 9.7 liver normal albumin 3.6  07-22-17: hgb a1c 7.8 tsh 4.194 vit B 12: 342 07-24-17: wbc 10.5; hgb 12.9; hct 39.5; mcv 93.8 plt 331; glucose 121 bun 18; creat 1.13;  4.1; na++ 144; ca 9.4 chol 234; ldl 157; trig 218; hdl 33  10-09-17: urine micro-albumin: 5.9 10-25-17: mag 2.1  10-31-17: wbc 10.2; hgb 11.1; hct 32.5 ;mcv 93.8; plt 309 glucose 170; bun 19; creat 1.02; k+ 4.1; na++138; ca 9.1; liver normal albumin 3.2 urine culture: multiple species  11-08-17: urine culture: e-coli  12-21-17: wbc 10.4; hgb 11.5; hct 36.8; mcv 100.3; plt 317; glucose 87; bun 14; creat 1.22; k+ 3.9; na++ 140; ca 9.7; liver  normal albumin 3.1  12-22-17: tsh 3.223 12-24-17: wbc 13.6; hgb 12.3; hct 37.6; mcv 95.9; plt 371; glucose 101; bun 7; creat 0.85; k+ 3.3; na++ 140; ca 9.6  12-16-17: wbc 12.0; gb 12.4; hct 38.5; mcv 98.0; plt 342; glucose 194; bun 14; creat 0.93; k+ 3.7; na++ 142; ca 9.5 01-03-18: wbc  9.7; hgb 11.6; hct 36.0; mcv 98.9 plt 327; glucose 90; bun 18; creat 1.05; k+ 3.4; na++ 144; ca 9.4 01-14-18; glucose 65; bun 22; creat 1.06; k+ 4.0; na++ 143; ca 9.0 01-17-18:  Vit D 38.1;  Vit B 12: 321  NO NEW LABS.   Review of Systems  Reason unable to perform ROS: confusion    Physical Exam Constitutional:      General: She is not in acute distress.    Appearance: She is well-developed. She is obese. She is not diaphoretic.  Neck:     Musculoskeletal: Neck supple.     Comments: Thyroid slightly enlarged  Cardiovascular:     Rate and Rhythm: Normal rate and regular rhythm.     Pulses: Normal pulses.     Heart sounds: Normal heart sounds.  Pulmonary:     Effort: Pulmonary effort is normal. No respiratory distress.     Breath sounds: Normal breath sounds.  Abdominal:     General: Bowel sounds are normal. There is no distension.     Palpations: Abdomen is soft.     Tenderness: There is no abdominal  tenderness.  Musculoskeletal:     Right lower leg: No edema.     Left lower leg: No edema.     Comments:  Has generalized weakness present    Lymphadenopathy:     Cervical: No cervical adenopathy.  Skin:    General: Skin is warm and dry.  Neurological:     Mental Status: She is alert. Mental status is at baseline.  Psychiatric:        Mood and Affect: Mood normal.     ASSESSMENT/ PLAN:  TODAY:   1. UI: without change: will continue ditropan 5 mg twice daily   2.  Bilateral chronic glaucoma: is stable will continue timoptic to both eyes and lumigan to both eyes.   3. PLMD (periodic limb movement disorder) is stable will continue requip 0.5 mg nightly   4.  Diabetes mellitus type 2 with  neurological manifestations: cbgs are elevated hgb a1c 7.8: will change to the following: toujeo 60 units nightly and humalog 32 units with meals for cbg >=150. Will check hgb a1c   PREVIOUS   5. Anxiety/sommloence : is stable will continue prozac 20 mg daily   6. Chronic bilateral lower back pain with right sided sciatica: is stable; will continue ultram  50 mg two times daily  lidoderm patch to her back daily and aspercreme to right knee   7. CAD-PCI OM1, 100% RCA (l-R collaterals): is stable will continue  lopressor 12. 5 mg twice daily  Due to low blood pressure has prn ntg   8. Dyslipidemia associated with type 2 diabetes mellitus: stable LDL 157 trig 218 : her lipitor and fish oil have been stopped   9. Chronic CVA: is neurologically stable will continue xarelto 15 mg daily   10. CKD stage 2 due to type 2 diabetes mellitus: is stable bun 22; creat 1.06  11. Hypertensive heart disease with congestive heart failure and stage 2 kidney disease: is stable : b/p 143/79 will continue  lopressor  12.5 mg daily   12. PAF( paroxymal atrial fibrillation) CHA2DS2- VASc score+6: heart rate is stable; will continue lopressor 12.5 mg  daily for rate control and xarelto 15 mg daily   13.  Chronic diastolic heart failure: is stable will continue lasix 20 mg daily   14. Diabetic neuropathy: is stable will continue neurontin 300 mg three times daily   15.  GERD without esophagitis: stable will continue prilosec 40 mg daily   16.  Chronic constipation: is stable will continue colace nightly senna twice daily  and miralax daily as needed  17.  Allergic rhinitis: is stable will continue astelin nasal spray nightly and xyzal 2.5 mg daily    MD is aware of resident's narcotic use and is in agreement with current plan of care. We will attempt to wean resident as apropriate   Ok Edwards NP [facility]  Contact [phone] Monday through Friday 8am- 5pm  After hours call [phone]

## 2018-03-01 ENCOUNTER — Other Ambulatory Visit
Admission: RE | Admit: 2018-03-01 | Discharge: 2018-03-01 | Disposition: A | Discharge status: Home / Self Care | Payer: Medicare Other | Source: Skilled Nursing Facility | Attending: Adult Health | Admitting: Adult Health

## 2018-03-01 DIAGNOSIS — E1122 Type 2 diabetes mellitus with diabetic chronic kidney disease: Secondary | ICD-10-CM | POA: Insufficient documentation

## 2018-03-01 LAB — HEMOGLOBIN A1C
Hgb A1c MFr Bld: 7.2 % — ABNORMAL HIGH (ref 4.8–5.6)
Mean Plasma Glucose: 159.94 mg/dL

## 2018-03-21 ENCOUNTER — Telehealth: Payer: Self-pay | Admitting: Family Medicine

## 2018-03-21 NOTE — Telephone Encounter (Signed)
Please contact the patients daughter and get more details. We will likely need documentation from the insurance company regarding what they need from [place].

## 2018-03-21 NOTE — Telephone Encounter (Signed)
Copied from [place] [id number]. Topic: Quick Communication - See Telephone Encounter >> Mar 21, 2018 12:47 PM Rutherford Nail, NT wrote: CRM for notification. See Telephone encounter for: 03/21/18. Patient's daughter, [name], calling and states that she is needing a statement that states that Dr Caryl Bis was seeing her and that she had caregivers coming to take care of her while her daughter was at work from October 2016-June 2019. States that this is for insurance purposes. Please advise.  CB#: [phone]

## 2018-03-21 NOTE — Telephone Encounter (Signed)
Sent to PCP to advise 

## 2018-03-22 NOTE — Telephone Encounter (Signed)
Called pt's daughter and left a VM to call back. CRM created and sent to [facility] pool.  

## 2018-03-22 NOTE — Telephone Encounter (Signed)
Patient's daughter is returning call from [place].  Please call her back at [phone]

## 2018-03-25 NOTE — Telephone Encounter (Addendum)
Pt's daughter calling to advise they only need a letter stating pt needed a caregiver in the past prior to moving into [place]. Dept of Social services is questioning if the daughter used the mother's money to pay a caregiver. The letter needs to say pt needed a caregiver to help with daily living needs.  [name] knows this is strange, but [place] DSS asked her to get this letter. They need asap.  She can pick up as soon as it is ready.  Not for insurance purposes

## 2018-03-25 NOTE — Telephone Encounter (Signed)
Sent to PCP please advise.  

## 2018-03-26 NOTE — Telephone Encounter (Signed)
I will forward to [place] so we can discuss this and to gather additional information.

## 2018-03-27 NOTE — Telephone Encounter (Signed)
Patient's daughter, Beryle Beams is calling for an update [phone]

## 2018-03-30 ENCOUNTER — Encounter
Admission: RE | Admit: 2018-03-30 | Discharge: 2018-03-30 | Disposition: A | Discharge status: Home / Self Care | Payer: Medicare Other | Source: Ambulatory Visit | Attending: Internal Medicine | Admitting: Internal Medicine

## 2018-04-02 ENCOUNTER — Telehealth: Payer: Self-pay | Admitting: *Deleted

## 2018-04-02 ENCOUNTER — Non-Acute Institutional Stay (SKILLED_NURSING_FACILITY): Payer: Medicare Other | Admitting: Adult Health

## 2018-04-02 ENCOUNTER — Encounter: Payer: Self-pay | Admitting: Adult Health

## 2018-04-02 DIAGNOSIS — F419 Anxiety disorder, unspecified: Secondary | ICD-10-CM | POA: Diagnosis not present

## 2018-04-02 DIAGNOSIS — R400 Somnolence: Secondary | ICD-10-CM | POA: Diagnosis not present

## 2018-04-02 DIAGNOSIS — G8929 Other chronic pain: Secondary | ICD-10-CM

## 2018-04-02 DIAGNOSIS — E1149 Type 2 diabetes mellitus with other diabetic neurological complication: Secondary | ICD-10-CM

## 2018-04-02 DIAGNOSIS — I2510 Atherosclerotic heart disease of native coronary artery without angina pectoris: Secondary | ICD-10-CM

## 2018-04-02 DIAGNOSIS — Z9861 Coronary angioplasty status: Secondary | ICD-10-CM

## 2018-04-02 DIAGNOSIS — M5441 Lumbago with sciatica, right side: Secondary | ICD-10-CM

## 2018-04-02 NOTE — Progress Notes (Signed)
Location:   [facility] Room Number: [place] of Service:  SNF (31)   CODE STATUS:  DNR  Allergies  Allergen Reactions  . Amlodipine Cough  . Clopidogrel Bisulfate Cough  . Kenalog [Triamcinolone Acetonide] Other (See Comments)    unknown  . Lisinopril Cough  . Losartan Cough  . Sulfa Antibiotics Hives and Rash    Chief Complaint  Patient presents with  . Medical Management of Chronic Issues    CAD-PCI )M1, 100% RCA (L-R collaterals); CKD; chronic bilateral lower back pain with right sided sciatica; somnolence; anxiety; diabetes mellitus type 2 with neurological manifestations.     HPI:  She is a 85 year old long term resident of this facility being seen for the management of her chronic illnesses; cad; back pain; somnolence; anxiety; diabetes. Her AM reading has had some lows; her cbg readings are more elevated as the day progresses. She denies any uncontrolled pain; no changes in appetite; denies excessive fatigue.   Past Medical History:  Diagnosis Date  . Abnormal nuclear stress test 06/21/2011   Inferolateral reversible defect;--> cardiac cath & PCI of CxOM, occluded RCA with collaterals;; followup Myoview 11/2012: Low risk. Fixed basal inferior artifact normal EF. No ischemia  . Anxiety   . Asthma   . Bilateral cataracts    Status post stroke or correction  . CAD S/P percutaneous coronary angioplasty 06/2011   s/P PCI to proximal OM1 w/ DES; occluded RCA with bridging and L-R collaterals (medical management)  . Chronic kidney disease (CKD), stage II (mild)    Related to current bladder infections (although diabetes cannot be excluded)  . Chronotropic incompetence with sinus node dysfunction [facility]) October 2013   On CPET test; beta blockers reduced  . Diabetes mellitus type 2, controlled ([place])    On oral medications  . Diverticulitis   . Essential hypertension    Allowing for permissive hypertension to avoid orthostatic hypotension  .  GERD (gastroesophageal reflux disease)    On PPI  . Glaucoma   . History of syncope    Per EP - neurocardiogenic & not Bradycardia related (no PPM)  . History of unstable angina 06/13/2011   Jaw pain awakening from sleep -- Myoview --CATH --> PCI  . Hyperlipidemia with target LDL less than 70    HDL at goal, LDL not at goal, borderline triglycerides. On Crestor 20 mg  . Migraines   . OSA (obstructive sleep apnea)    hx bladder infections  . Osteoarthritis   . PAF (paroxysmal atrial fibrillation) ([place]) 10-11/ 2014   CardioNet Event Monitor: NSR & S Brady -- Rates 50-100; Total A. fib burden 35 hours and 27 minutes. 1296 episodes, longest was 1 hour 29 minutes. Rate ranged from 52-169 beats per minute.  . Seasonal allergies   . Shortness of breath on exertion October 2013   2-D echo: Normal EF>55%, Gr 1DD, mild aortic sclerosis; Evaluated with CPET - peak VO2 97%; Chronotropic Incompetence (submaximal effort)  . Spinal stenosis of lumbar region 11/2011  . Stroke ([place]) 06/2017  . Stroke ([place]) 11/2014  . Tachycardia-bradycardia syndrome ([place]) 11/2012  . Urge incontinence     Past Surgical History:  Procedure Laterality Date  . APPENDECTOMY    . BREAST BIOPSY     both breast  . cataracts     both eyes  . COLONOSCOPY    . CORONARY ANGIOPLASTY WITH STENT PLACEMENT  07/05/2011   Promus Element DES 2.5 mm x 16  mm-post dilated to 2.65 mm CX-Proximal OM1  . Holter Monitor  04/2015   Sinus rhythm with rates 52-149 BPM. Isolated PACs with rare couplets and bigeminy. Multiple short runs of PAT/PSVT. Arrhythmia run 120 bpm for 204 beats. 14% of time was in A. fib/flutter. - Reviewed by Dr. Caryl Comes. Not thought to be significant enough for her syncope.  Marland Kitchen KNEE ARTHROSCOPY WITH MEDIAL MENISECTOMY Right 06/24/2014   Procedure: RIGHT KNEE ARTHROSCOPY WITH partial lateral MENISECTOMY, abrasion chondroplasty of medial femoral condyle and patella, microfracture technique;  Surgeon: Latanya Maudlin, MD;   Location: [facility];  Service: Orthopedics;  Laterality: Right;  . LEFT HEART CATHETERIZATION WITH CORONARY ANGIOGRAM N/A 07/06/2011   Procedure: LEFT HEART CATHETERIZATION WITH CORONARY ANGIOGRAM;  Surgeon: Leonie Man, MD;  Location: [facility] CATH LAB: Unstable Angina --> Myoview wiht Inf-Lat Ischemia.  Proximal OM1 lesion --> PCI; 100% mid RCA with right to right and left to right bridging collaterals from circumflex RPL and LAD the PDA.  Marland Kitchen NM MYOVIEW LTD  October 2014    Low risk. Fixed basal inferior artifact normal EF. No ischemia --> (as compared to pre-PCI Myoview revealing inferolateral ischemia)  . rotator cuff Right   . TRANSTHORACIC ECHOCARDIOGRAM  11/2014   EF 65-70%, Mod LVH. Normal wall motion. Gr 1 DD. Normal valves.  Marland Kitchen VAGINAL HYSTERECTOMY      Social History   Socioeconomic History  . Marital status: Widowed    Spouse name: Not on file  . Number of children: Not on file  . Years of education: Not on file  . Highest education level: Not on file  Occupational History  . Occupation: retired  Scientific laboratory technician  . Financial resource strain: Not hard at all  . Food insecurity:    Worry: Patient refused    Inability: Patient refused  . Transportation needs:    Medical: Patient refused    Non-medical: Patient refused  Tobacco Use  . Smoking status: Never Smoker  . Smokeless tobacco: Never Used  Substance and Sexual Activity  . Alcohol use: No  . Drug use: No  . Sexual activity: Never    Birth control/protection: Post-menopausal  Lifestyle  . Physical activity:    Days per week: Patient refused    Minutes per session: Patient refused  . Stress: Only a little  Relationships  . Social connections:    Talks on phone: Patient refused    Gets together: Patient refused    Attends religious service: Patient refused    Active member of club or organization: Patient refused    Attends meetings of clubs or organizations: Patient refused    Relationship status: Patient refused  .  Intimate partner violence:    Fear of current or ex partner: Patient refused    Emotionally abused: Patient refused    Physically abused: Patient refused    Forced sexual activity: Patient refused  Other Topics Concern  . Not on file  Social History Narrative   Widowed mother of one, grandmother 2. Has not been using silver take as much lately due to knee discomfort. Usually exercises with Silver Sneakers on regular basis.   Family History  Problem Relation Age of Onset  . COPD Mother   . Pulmonary fibrosis Mother   . Heart attack Father   . Heart disease Father   . Stroke Father   . Cancer Sister   . Cancer Sister   . Clotting disorder Sister   . Heart attack Sister   . Arthritis Other   .  Breast cancer Sister   . Colon cancer Neg Hx   . Esophageal cancer Neg Hx   . Stomach cancer Neg Hx       VITAL SIGNS BP (!) 131/53   Pulse 80   Temp 98.2 F (36.8 C)   Resp 16   Ht 5\' 4"  (1.626 m)   Wt 164 lb 3.2 oz (74.5 kg)   SpO2 96%   BMI 28.18 kg/m   Outpatient Encounter Medications as of 04/02/2018  Medication Sig  . acetaminophen (TYLENOL) 650 MG CR tablet Take 650 mg by mouth every 6 (six) hours as needed for pain.  Marland Kitchen azelastine (ASTELIN) 0.1 % nasal spray Place 2 sprays into both nostrils at bedtime. Use in each nostril as directed   . bisacodyl (DULCOLAX) 10 MG suppository Place 10 mg rectally daily as needed for moderate constipation.  . docusate sodium (STOOL SOFTENER) 100 MG capsule Take 100 mg by mouth daily.   Marland Kitchen FLUoxetine (PROZAC) 20 MG tablet Take 20 mg by mouth daily.  . furosemide (LASIX) 20 MG tablet Take 20 mg by mouth daily.  Marland Kitchen gabapentin (NEURONTIN) 300 MG capsule Take 300 mg by mouth 3 (three) times daily.   Marland Kitchen glucagon (GLUCAGON EMERGENCY) 1 MG injection Inject 1 mg into the vein once as needed. Give 1 mg IM for Hypoglycemia (symptomatic) not responding to oral glucose, snack. May repeat x 1 after 15 minutes. USE ONLY IF PT IS OR IS BECOMING UNRESPONSIVE    . GLUCERNA (GLUCERNA) LIQD Take 237 mLs by mouth 2 (two) times daily between meals.  Marland Kitchen HUMALOG KWIKPEN 100 UNIT/ML KiwkPen Inject 32 Units into the skin 3 (three) times daily with meals. Give with meals for cbg > = 150  . hydrocortisone cream 1 % Apply thin film topically to clean, dry hemorrhoids four times daily as needed  . Infant Care Products [facility] EX) Apply liberal amount topically to area of skin irritation prn. OK to leave at bedside.  . Insulin Glargine (TOUJEO MAX SOLOSTAR) 300 UNIT/ML SOPN Inject 60 Units into the skin at bedtime. Alternate abdominal injection sites.  . latanoprost (XALATAN) 0.005 % ophthalmic solution Place 1 drop into both eyes daily.   Marland Kitchen levETIRAcetam (KEPPRA) 500 MG tablet Take 1 tablet (500 mg total) by mouth 2 (two) times daily.  Marland Kitchen levocetirizine (XYZAL) 5 MG tablet Take 2.5 mg by mouth daily.   . Lidocaine-Menthol (ICY HOT LIDOCAINE PLUS MENTHOL) 4-1 % PTCH Apply 1 patch topically daily. apply to lower back in the a.m. for pain management  . Magnesium 500 MG CAPS Take 500 mg by mouth at bedtime.   . metoprolol tartrate (LOPRESSOR) 25 MG tablet Take 12.5 mg by mouth daily.   . mirtazapine (REMERON SOL-TAB) 15 MG disintegrating tablet Take 0.5 tablets (7.5 mg total) by mouth at bedtime.  . nitroGLYCERIN (NITROSTAT) 0.4 MG SL tablet Place 0.4 mg under the tongue every 5 (five) minutes as needed for chest pain.  . NON FORMULARY Diet Order: Upgrade patient to dysphagia 2 consistencies (ground), continue thin liquids; Straws ok  . Nutritional Supplements (NUTRITIONAL SUPPLEMENT PO) Snack at bedtime  . omeprazole (PRILOSEC) 40 MG capsule Take 40 mg by mouth daily.  Marland Kitchen oxybutynin (DITROPAN) 5 MG tablet Take 5 mg by mouth 2 (two) times daily.  . OXYGEN Inhale 2 L/min into the lungs as needed.   . polyethylene glycol (MIRALAX / GLYCOLAX) packet Give 17 gm by mouth once every morning  . Rivaroxaban (XARELTO) 15 MG TABS tablet Take  15 mg by mouth every evening.   Marland Kitchen  rOPINIRole (REQUIP) 0.5 MG tablet TAKE 1 TABLET BY MOUTH AT  BEDTIME  . Skin Protectants, Misc. (ENDIT EX) Apply topically to buttock three times daily as needed  . timolol (TIMOPTIC) 0.5 % ophthalmic solution Place 1 drop into both eyes at bedtime.   . traMADol (ULTRAM) 50 MG tablet Take 50 mg by mouth every 12 (twelve) hours.  . trolamine salicylate (ASPERCREME) 10 % cream Apply 1 application topically 3 (three) times daily. Apply to right knee   No facility-administered encounter medications on file as of 04/02/2018.      SIGNIFICANT DIAGNOSTIC EXAMS  PREVIOUS;  12-21-17: ct of head:  1. No acute intracranial abnormality identified. 2. ASPECTS is 10. 3. Stable chronic microvascular ischemic changes, volume loss, and small chronic infarcts of the basal ganglia. 4. Chronic infarct of the left lentiform nucleus, caudate body, and corona radiata corresponding to the prior acute infarction on 07/22/2017 MRI of the brain. 5. Sphenoid sinus opacification with fluid level, progressed from prior study, compatible with acute sinusitis in the appropriate clinical setting.  12-21-17: MRI of brain: 1. Motion degraded examination.  No acute intracranial process. 2. Multiple old small supra- and infratentorial infarcts. 3. Moderate to severe parenchymal brain volume loss.  12-22-17: chest x-ray: No active disease.  NO NEW EXAMS.     LABS REVIEWED: PREVIOUS  07-21-17: wbc 10.1; hgb 13.8; hct 43.8; mcv 94.5; plt 323 glucose 221; bun 18; creat 1.21; k+ 4.2; na++ 136; ca 9.7 liver normal albumin 3.6  07-22-17: hgb a1c 7.8 tsh 4.194 vit B 12: 342 07-24-17: wbc 10.5; hgb 12.9; hct 39.5; mcv 93.8 plt 331; glucose 121 bun 18; creat 1.13;  4.1; na++ 144; ca 9.4 chol 234; ldl 157; trig 218; hdl 33  10-09-17: urine micro-albumin: 5.9 10-25-17: mag 2.1  10-31-17: wbc 10.2; hgb 11.1; hct 32.5 ;mcv 93.8; plt 309 glucose 170; bun 19; creat 1.02; k+ 4.1; na++138; ca 9.1; liver normal albumin 3.2 urine culture:  multiple species  11-08-17: urine culture: e-coli  12-21-17: wbc 10.4; hgb 11.5; hct 36.8; mcv 100.3; plt 317; glucose 87; bun 14; creat 1.22; k+ 3.9; na++ 140; ca 9.7; liver normal albumin 3.1  12-22-17: tsh 3.223 12-24-17: wbc 13.6; hgb 12.3; hct 37.6; mcv 95.9; plt 371; glucose 101; bun 7; creat 0.85; k+ 3.3; na++ 140; ca 9.6  12-16-17: wbc 12.0; gb 12.4; hct 38.5; mcv 98.0; plt 342; glucose 194; bun 14; creat 0.93; k+ 3.7; na++ 142; ca 9.5 01-03-18: wbc  9.7; hgb 11.6; hct 36.0; mcv 98.9 plt 327; glucose 90; bun 18; creat 1.05; k+ 3.4; na++ 144; ca 9.4 01-14-18; glucose 65; bun 22; creat 1.06; k+ 4.0; na++ 143; ca 9.0 01-17-18:  Vit D 38.1;  Vit B 12: 321  TODAY:   03-01-18: hgb a1c 7.2   Review of Systems  Constitutional: Negative for malaise/fatigue.  Respiratory: Negative for cough and shortness of breath.   Cardiovascular: Negative for chest pain, palpitations and leg swelling.  Gastrointestinal: Positive for constipation. Negative for abdominal pain and heartburn.  Musculoskeletal: Negative for back pain, joint pain and myalgias.  Skin: Negative.   Neurological: Negative for dizziness.  Psychiatric/Behavioral: The patient is not nervous/anxious.      Physical Exam Constitutional:      General: She is not in acute distress.    Appearance: She is well-developed. She is not diaphoretic.     Comments: Obese   Neck:     Thyroid: Thyromegaly  present.  Cardiovascular:     Rate and Rhythm: Normal rate and regular rhythm.     Heart sounds: Normal heart sounds.  Pulmonary:     Effort: Pulmonary effort is normal. No respiratory distress.     Breath sounds: Normal breath sounds.  Abdominal:     General: Bowel sounds are normal. There is no distension.     Palpations: Abdomen is soft.     Tenderness: There is no abdominal tenderness.  Musculoskeletal:     Right lower leg: No edema.     Left lower leg: No edema.     Comments: Is able to move all extremities   Lymphadenopathy:      Cervical: No cervical adenopathy.  Skin:    General: Skin is warm and dry.  Neurological:     Mental Status: She is alert. Mental status is at baseline.  Psychiatric:        Mood and Affect: Mood normal.      ASSESSMENT/ PLAN:  TODAY:   1.  Diabetes mellitus type 2 with neurological manifestations: cbgs are elevated hgb a1c 7.2: her AM readings are <150; with a low reading; will lower her toujeo to 55 units nightly and will change humalog to 25 units with meals for cbg >150   2. Anxiety/sommloence : is stable will continue prozac 20 mg daily has less periods of lethargy   3. Chronic bilateral lower back pain with right sided sciatica: is stable; will continue ultram  50 mg two times daily  lidoderm patch to her back daily and aspercreme to right knee   4. CAD-PCI OM1, 100% RCA (l-R collaterals): is stable will continue  lopressor 12. 5 mg  daily  Due to low blood pressure has prn ntg   PREVIOUS   5. Dyslipidemia associated with type 2 diabetes mellitus: stable LDL 157 trig 218 : her lipitor and fish oil have been stopped   6. Chronic CVA: is neurologically stable will continue xarelto 15 mg daily   7. CKD stage 2 due to type 2 diabetes mellitus: is stable bun 22; creat 1.06  8. Hypertensive heart disease with congestive heart failure and stage 2 kidney disease: is stable : b/p 131/53 will continue  lopressor  12.5 mg daily   9. PAF( paroxymal atrial fibrillation) CHA2DS2- VASc score+6: heart rate is stable; will continue lopressor 12.5 mg  daily for rate control and xarelto 15 mg daily   10.  Chronic diastolic heart failure: is stable will continue lasix 20 mg daily   11. Diabetic neuropathy: is stable will continue neurontin 300 mg three times daily   12.  GERD without esophagitis: stable will continue prilosec 40 mg daily   13.  Chronic constipation: is worse will continue colace daily miralax daily and will begin senna 2 tabs nightly   14.  Allergic rhinitis: is stable  will continue astelin nasal spray nightly and xyzal 2.5 mg daily   15. UI: without change: will continue ditropan 5 mg twice daily   16.  Bilateral chronic glaucoma: is stable will continue timoptic to both eyes and lumigan to both eyes.   17. PLMD (periodic limb movement disorder) is stable will continue requip 0.5 mg nightly      MD is aware of resident's narcotic use and is in agreement with current plan of care. We will attempt to wean resident as apropriate   Ok Edwards NP [facility]  Contact [phone] Monday through Friday 8am- 5pm  After hours call  [phone]  

## 2018-04-02 NOTE — Telephone Encounter (Signed)
Sent to [place] for [employer] on this situation for me to inform pt's daughter on?

## 2018-04-02 NOTE — Telephone Encounter (Signed)
Copied from [place] [id number]. Topic: General - Other >> Apr 02, 2018  4:08 PM Yvette Rack wrote: Reason for CRM: Pt daughter [name] called in for an update on the letter that she requested. Alesia requests call back. Cb# [phone]

## 2018-04-03 NOTE — Telephone Encounter (Signed)
Dr. Caryl Bis I know from what Lorriane Shire told me you were looking into this and how to address the letter. Please inform me what you would like for me to advise the daughter of.

## 2018-04-03 NOTE — Telephone Encounter (Signed)
Letter completed.  Placed on [employer].  Please make available for pickup.

## 2018-04-03 NOTE — Telephone Encounter (Signed)
Dr. Caryl Bis will review chart.

## 2018-04-03 NOTE — Telephone Encounter (Signed)
Letter completed.  Placed on your desk.  Please make available for pickup.

## 2018-04-04 NOTE — Telephone Encounter (Signed)
Called patient's daughter [name] to inform her that the letter was ready for pick up.

## 2018-04-09 DIAGNOSIS — I69320 Aphasia following cerebral infarction: Secondary | ICD-10-CM | POA: Diagnosis not present

## 2018-04-09 DIAGNOSIS — I4891 Unspecified atrial fibrillation: Secondary | ICD-10-CM | POA: Diagnosis not present

## 2018-04-09 DIAGNOSIS — Z0000 Encounter for general adult medical examination without abnormal findings: Secondary | ICD-10-CM | POA: Diagnosis not present

## 2018-04-09 DIAGNOSIS — E1122 Type 2 diabetes mellitus with diabetic chronic kidney disease: Secondary | ICD-10-CM | POA: Diagnosis not present

## 2018-04-09 DIAGNOSIS — I25119 Atherosclerotic heart disease of native coronary artery with unspecified angina pectoris: Secondary | ICD-10-CM | POA: Diagnosis not present

## 2018-04-15 NOTE — Telephone Encounter (Signed)
Dr. Caryl Bis I have not received this letter

## 2018-04-15 NOTE — Telephone Encounter (Signed)
Placed on your desk. 

## 2018-04-16 NOTE — Telephone Encounter (Signed)
Please check at the front to see if it is already there. I printed and signed this on the 5th. There is a phone note stating that the patients daughter was called and advised this was available for pickup on the 6th so the letter may already be at the front.

## 2018-04-16 NOTE — Telephone Encounter (Signed)
Dr. Caryl Bis I do not have this letter not sure what might have happen to it. Are you able to recreate a new one?

## 2018-04-16 NOTE — Telephone Encounter (Signed)
I called the daughter and advised that the letter was ready for pick up . I placed it in the accordion.

## 2018-04-16 NOTE — Telephone Encounter (Signed)
Has this been placed up front for pick up?

## 2018-04-18 ENCOUNTER — Encounter: Payer: Self-pay | Admitting: Adult Health

## 2018-04-18 ENCOUNTER — Non-Acute Institutional Stay (SKILLED_NURSING_FACILITY): Payer: Medicare Other | Admitting: Adult Health

## 2018-04-18 DIAGNOSIS — G894 Chronic pain syndrome: Secondary | ICD-10-CM

## 2018-04-18 DIAGNOSIS — F321 Major depressive disorder, single episode, moderate: Secondary | ICD-10-CM

## 2018-04-18 DIAGNOSIS — I5032 Chronic diastolic (congestive) heart failure: Secondary | ICD-10-CM

## 2018-04-18 DIAGNOSIS — G2581 Restless legs syndrome: Secondary | ICD-10-CM | POA: Diagnosis not present

## 2018-04-18 DIAGNOSIS — G40909 Epilepsy, unspecified, not intractable, without status epilepticus: Secondary | ICD-10-CM

## 2018-04-18 DIAGNOSIS — E1149 Type 2 diabetes mellitus with other diabetic neurological complication: Secondary | ICD-10-CM | POA: Diagnosis not present

## 2018-04-18 DIAGNOSIS — I480 Paroxysmal atrial fibrillation: Secondary | ICD-10-CM

## 2018-04-18 NOTE — Progress Notes (Signed)
Location:  [facility] Number: [place] of Service:  SNF ([phone]) Provider:  Durenda Age, NP  Patient Care Team: Leone Haven, MD as PCP - General (Family Medicine) Kirk Ruths, MD (Internal Medicine) Nyoka Cowden Phylis Bougie, NP as Nurse Practitioner (Geriatric Medicine)  Extended Emergency Contact Information Primary Emergency Contact: [facility] Address: [address]          [place], [place] [address] [name] of [place] Phone: [phone] Work Phone: [phone] Mobile Phone: [phone] Relation: Daughter Secondary Emergency Contact: Marliss Czar,  [address] [name] of [place] Phone: [phone] Work Phone: [phone] Mobile Phone: [phone] Relation: Sister  Code Status:  DNR  Goals of care: Advanced Directive information Advanced Directives 04/18/2018  Does Patient Have a Medical Advance Directive? Yes  Type of Advance Directive Out of facility DNR (pink MOST or yellow form)  Does patient want to make changes to medical advance directive? No - Patient declined  Copy of [place] in Chart? -  Would patient like information on creating a medical advance directive? No - Patient declined  Pre-existing out of facility DNR order (yellow form or pink MOST form) Yellow form placed in chart (order not valid for inpatient use)     Chief Complaint  Patient presents with  . Medical Management of Chronic Issues    Routine Visit     HPI:  Pt is a 85 y.o. female seen today for medical management of chronic diseases. She has PMH of anxiety, asthma, CAD S/P percutaneous coronary angioplasty, CKD, hypertension, stroke and OSA. She was seen today in her room. She was started crying with tears when inquired how she is doing. When asked why she is crying, she said, "I don't know. I think it's about everything here." Daughter had care plan meeting yesterday and requested  for Prozac to be increased. Prozac was started on 01/25/18 for her depression. Her CBGs are well-controlled.   Past Medical History:  Diagnosis Date  . Abnormal nuclear stress test 06/21/2011   Inferolateral reversible defect;--> cardiac cath & PCI of CxOM, occluded RCA with collaterals;; followup Myoview 11/2012: Low risk. Fixed basal inferior artifact normal EF. No ischemia  . Anxiety   . Asthma   . Bilateral cataracts    Status post stroke or correction  . CAD S/P percutaneous coronary angioplasty 06/2011   s/P PCI to proximal OM1 w/ DES; occluded RCA with bridging and L-R collaterals (medical management)  . Chronic kidney disease (CKD), stage II (mild)    Related to current bladder infections (although diabetes cannot be excluded)  . Chronotropic incompetence with sinus node dysfunction [facility]) October 2013   On CPET test; beta blockers reduced  . Diabetes mellitus type 2, controlled ([place])    On oral medications  . Diverticulitis   . Essential hypertension    Allowing for permissive hypertension to avoid orthostatic hypotension  . GERD (gastroesophageal reflux disease)    On PPI  . Glaucoma   . History of syncope    Per EP - neurocardiogenic & not Bradycardia related (no PPM)  . History of unstable angina 06/13/2011   Jaw pain awakening from sleep -- Myoview --CATH --> PCI  . Hyperlipidemia with target LDL less than 70    HDL at goal, LDL not at goal, borderline triglycerides. On Crestor 20 mg  . Migraines   . OSA (obstructive sleep apnea)    hx  bladder infections  . Osteoarthritis   . PAF (paroxysmal atrial fibrillation) ([place]) 10-11/ 2014   CardioNet Event Monitor: NSR & S Brady -- Rates 50-100; Total A. fib burden 35 hours and 27 minutes. 1296 episodes, longest was 1 hour 29 minutes. Rate ranged from 52-169 beats per minute.  . Seasonal allergies   . Shortness of breath on exertion October 2013   2-D echo: Normal EF>55%, Gr 1DD, mild aortic sclerosis; Evaluated with CPET -  peak VO2 97%; Chronotropic Incompetence (submaximal effort)  . Spinal stenosis of lumbar region 11/2011  . Stroke ([place]) 06/2017  . Stroke ([place]) 11/2014  . Tachycardia-bradycardia syndrome ([place]) 11/2012  . Urge incontinence    Past Surgical History:  Procedure Laterality Date  . APPENDECTOMY    . BREAST BIOPSY     both breast  . cataracts     both eyes  . COLONOSCOPY    . CORONARY ANGIOPLASTY WITH STENT PLACEMENT  07/05/2011   Promus Element DES 2.5 mm x 16 mm-post dilated to 2.65 mm CX-Proximal OM1  . Holter Monitor  04/2015   Sinus rhythm with rates 52-149 BPM. Isolated PACs with rare couplets and bigeminy. Multiple short runs of PAT/PSVT. Arrhythmia run 120 bpm for 204 beats. 14% of time was in A. fib/flutter. - Reviewed by Dr. Caryl Comes. Not thought to be significant enough for her syncope.  Marland Kitchen KNEE ARTHROSCOPY WITH MEDIAL MENISECTOMY Right 06/24/2014   Procedure: RIGHT KNEE ARTHROSCOPY WITH partial lateral MENISECTOMY, abrasion chondroplasty of medial femoral condyle and patella, microfracture technique;  Surgeon: Latanya Maudlin, MD;  Location: [facility];  Service: Orthopedics;  Laterality: Right;  . LEFT HEART CATHETERIZATION WITH CORONARY ANGIOGRAM N/A 07/06/2011   Procedure: LEFT HEART CATHETERIZATION WITH CORONARY ANGIOGRAM;  Surgeon: Leonie Man, MD;  Location: [place] CATH LAB: Unstable Angina --> Myoview wiht Inf-Lat Ischemia.  Proximal OM1 lesion --> PCI; 100% mid RCA with right to right and left to right bridging collaterals from circumflex RPL and LAD the PDA.  Marland Kitchen NM MYOVIEW LTD  October 2014    Low risk. Fixed basal inferior artifact normal EF. No ischemia --> (as compared to pre-PCI Myoview revealing inferolateral ischemia)  . rotator cuff Right   . TRANSTHORACIC ECHOCARDIOGRAM  11/2014   EF 65-70%, Mod LVH. Normal wall motion. Gr 1 DD. Normal valves.  Marland Kitchen VAGINAL HYSTERECTOMY      Allergies  Allergen Reactions  . Amlodipine Cough  . Clopidogrel Bisulfate Cough  . Kenalog  [Triamcinolone Acetonide] Other (See Comments)    unknown  . Lisinopril Cough  . Losartan Cough  . Sulfa Antibiotics Hives and Rash    Outpatient Encounter Medications as of 04/18/2018  Medication Sig  . acetaminophen (TYLENOL) 650 MG CR tablet Take 650 mg by mouth every 6 (six) hours as needed for pain.  Marland Kitchen azelastine (ASTELIN) 0.1 % nasal spray Place 2 sprays into both nostrils at bedtime. Use in each nostril as directed   . bisacodyl (DULCOLAX) 10 MG suppository Place 10 mg rectally daily as needed for moderate constipation.  . docusate sodium (STOOL SOFTENER) 100 MG capsule Take 100 mg by mouth daily.   Marland Kitchen FLUoxetine (PROZAC) 20 MG tablet Take 20 mg by mouth daily.  . furosemide (LASIX) 20 MG tablet Take 20 mg by mouth daily.  Marland Kitchen gabapentin (NEURONTIN) 300 MG capsule Take 300 mg by mouth 3 (three) times daily.   Marland Kitchen glucagon (GLUCAGON EMERGENCY) 1 MG injection Inject 1 mg into the vein once as needed. Give 1 mg IM  for Hypoglycemia (symptomatic) not responding to oral glucose, snack. May repeat x 1 after 15 minutes. USE ONLY IF PT IS OR IS BECOMING UNRESPONSIVE  . GLUCERNA (GLUCERNA) LIQD Take 237 mLs by mouth 2 (two) times daily between meals.  Marland Kitchen HUMALOG KWIKPEN 100 UNIT/ML KiwkPen Inject 35 Units into the skin 3 (three) times daily with meals. Give with meals for cbg > = 150  . hydrocortisone cream 1 % Apply thin film topically to clean, dry hemorrhoids four times daily as needed  . Infant Care Products [facility] EX) Apply liberal amount topically to area of skin irritation prn. OK to leave at bedside.  . Insulin Glargine (TOUJEO MAX SOLOSTAR) 300 UNIT/ML SOPN Inject 55 Units into the skin at bedtime. Alternate abdominal injection sites.  . latanoprost (XALATAN) 0.005 % ophthalmic solution Place 1 drop into both eyes daily.   Marland Kitchen levETIRAcetam (KEPPRA) 500 MG tablet Take 1 tablet (500 mg total) by mouth 2 (two) times daily.  Marland Kitchen levocetirizine (XYZAL) 5 MG tablet Take 2.5 mg by mouth daily.   .  Lidocaine-Menthol (ICY HOT LIDOCAINE PLUS MENTHOL) 4-1 % PTCH Apply 1 patch topically daily. apply to lower back in the a.m. for pain management  . Magnesium 500 MG CAPS Take 500 mg by mouth at bedtime.   . metoprolol tartrate (LOPRESSOR) 25 MG tablet Take 12.5 mg by mouth daily.   . mirtazapine (REMERON SOL-TAB) 15 MG disintegrating tablet Take 0.5 tablets (7.5 mg total) by mouth at bedtime.  . nitroGLYCERIN (NITROSTAT) 0.4 MG SL tablet Place 0.4 mg under the tongue every 5 (five) minutes as needed for chest pain.  . NON FORMULARY Diet Order: Upgrade patient to dysphagia 2 consistencies (ground), continue thin liquids; Straws ok  . Nutritional Supplements (NUTRITIONAL SUPPLEMENT PO) Snack at bedtime  . omeprazole (PRILOSEC) 40 MG capsule Take 40 mg by mouth daily.  Marland Kitchen oxybutynin (DITROPAN) 5 MG tablet Take 5 mg by mouth 2 (two) times daily.  . OXYGEN Inhale 2 L/min into the lungs as needed.   . polyethylene glycol (MIRALAX / GLYCOLAX) packet Give 17 gm by mouth once every morning  . Rivaroxaban (XARELTO) 15 MG TABS tablet Take 15 mg by mouth every evening.   Marland Kitchen rOPINIRole (REQUIP) 0.5 MG tablet TAKE 1 TABLET BY MOUTH AT  BEDTIME  . Skin Protectants, Misc. (ENDIT EX) Apply topically to buttock three times daily as needed  . timolol (TIMOPTIC) 0.5 % ophthalmic solution Place 1 drop into both eyes at bedtime.   . traMADol (ULTRAM) 50 MG tablet Take 50 mg by mouth every 12 (twelve) hours.  . trolamine salicylate (ASPERCREME) 10 % cream Apply 1 application topically 3 (three) times daily. Apply to right knee   No facility-administered encounter medications on file as of 04/18/2018.     Review of Systems  GENERAL: No change in appetite, no fatigue, no weight changes, no fever, chills or weakness MOUTH and THROAT: Denies oral discomfort, gingival pain or bleeding RESPIRATORY: no cough, SOB, DOE, wheezing, hemoptysis CARDIAC: No chest pain, edema or palpitations GI: No abdominal pain, diarrhea,  constipation, heart burn, nausea or vomiting GU: Denies dysuria, frequency, hematuria, or discharge NEUROLOGICAL: Denies dizziness, syncope, numbness, or headache PSYCHIATRIC: +feelings of depression    Immunization History  Administered Date(s) Administered  . HPV Bivalent 11/11/2016  . Influenza, High Dose Seasonal PF 10/31/2016  . Influenza,inj,Quad PF,6+ Mos 11/28/2012, 12/08/2014  . Influenza,inj,quad, With Preservative 11/27/2016  . Influenza-Unspecified 12/29/2014, 11/12/2015, 11/29/2017  . PPD Test 12/29/2014, 07/25/2017  .  Pneumococcal Conjugate-13 02/27/2006, 10/31/2016  . Pneumococcal-Unspecified 12/29/2014, 10/28/2016  . Zoster 03/30/2006  . Zoster Recombinat (Shingrix) 10/25/2017   Pertinent  Health Maintenance Due  Topic Date Due  . HEMOGLOBIN A1C  08/30/2018  . URINE MICROALBUMIN  10/10/2018  . INFLUENZA VACCINE  Completed  . DEXA SCAN  Completed  . PNA vac Low Risk Adult  Completed  . FOOT EXAM  Discontinued  . OPHTHALMOLOGY EXAM  Discontinued   Fall Risk  02/23/2016 04/12/2015  Falls in the past year? Yes Yes  Number falls in past yr: 1 1  Injury with Fall? No No  Follow up Falls prevention discussed;Education provided -     Vitals:   04/18/18 1059  BP: 140/60  Pulse: 77  Resp: 16  Temp: 98.1 F (36.7 C)  TempSrc: Oral  SpO2: 98%  Weight: 161 lb 3.2 oz (73.1 kg)  Height: 5\' 4"  (1.626 m)   Body mass index is 27.67 kg/m.  Physical Exam  GENERAL APPEARANCE: Well nourished. In no acute distress. Normal body habitus SKIN:  Skin is warm and dry.  MOUTH and THROAT: Lips are without lesions. Oral mucosa is moist and without lesions. Tongue is normal in shape, size, and color and without lesions RESPIRATORY: Breathing is even & unlabored, BS CTAB CARDIAC: RRR, no murmur,no extra heart sounds, no edema GI: Abdomen soft, normal BS, no masses, no tenderness EXTREMITIES:  She is not able to move her RLE NEUROLOGICAL: There is no tremor. Speech is  clear. Alert to self, disoriented to time and place. PSYCHIATRIC:  Sad affect   Labs reviewed: Recent Labs    10/25/17 0630  12/26/17 1141 01/03/18 0620 01/14/18 0615  NA  --    < > 142 144 143  K  --    < > 3.7 3.4* 4.0  CL  --    < > 109 103 104  CO2  --    < > 24 29 32  GLUCOSE  --    < > 194* 90 65*  BUN  --    < > 14 18 22   CREATININE  --    < > 0.93 1.05* 1.06*  CALCIUM  --    < > 9.5 9.4 9.0  MG 2.1  --   --   --   --    < > = values in this interval not displayed.   Recent Labs    10/31/17 0620 12/21/17 1746 12/22/17 0344  AST 19 28 27   ALT 20 29 28   ALKPHOS 92 114 96  BILITOT 0.6 0.2* 0.5  PROT 6.7 6.6 6.4*  ALBUMIN 3.2* 3.1* 3.2*   Recent Labs    10/31/17 0620 12/21/17 1746  12/24/17 1905 12/26/17 1141 01/03/18 0620  WBC 10.2 10.4   < > 13.6* 12.0* 9.7  NEUTROABS 7.2* 6.9  --   --   --  5.9  HGB 11.1* 11.5*   < > 12.3 12.4 11.6*  HCT 32.5* 36.8   < > 37.6 38.5 36.0  MCV 93.8 100.3*   < > 95.9 98.0 98.9  PLT 309 317   < > 371 342 327   < > = values in this interval not displayed.   Lab Results  Component Value Date   TSH 3.223 12/22/2017   Lab Results  Component Value Date   HGBA1C 7.2 (H) 03/01/2018   Lab Results  Component Value Date   CHOL 234 (H) 07/24/2017   HDL 33 (L) 07/24/2017   [place]  157 (H) 07/24/2017   LDLDIRECT 106.0 05/18/2015   TRIG 218 (H) 07/24/2017   CHOLHDL 7.1 07/24/2017       Assessment/Plan  1. Depression, major, single episode, moderate ([place]) - she has been crying every time she talks to staff or her family, will increase Prozac from 20 mg to 40 mg daily, continue Remeron 15 mg 1/2 tab = 10.5 mg at bedtime  2. Diabetes mellitus type 2 with neurological manifestations ([place]) Lab Results  Component Value Date   HGBA1C 7.2 (H) 03/01/2018  -Continue Toujeo 300 unit/mL inject 55 units subcutaneously at bedtime, Humalog 100 unit/mL inject 35 units subcutaneously with meals 3 times daily for CBG >=150, CBG before  meals and at bedtime   3. Chronic diastolic CHF (congestive heart failure) (HCC) -No SOB, continue Lasix 20 mg 1 tab daily,  metoprolol tartrate 25 mg 1/2 tab = 12.5 mg daily   4. RLS (restless legs syndrome) -Stable, continue ropinirole 0.5 mg 1 tab at bedtime  5. Chronic pain syndrome -Well-controlled, continue tramadol 50 mg 1 tab every 12 hours  6. PAF (paroxysmal atrial fibrillation) (HCC);  CHA2DS2-VASc Score =6 on 15 mg Xarelto -Rate controlled, continue Xarelto 15 mg 1 tab daily, metoprolol tartrate 25 mg 1/2 tab = 12.5 mg daily  7.  Seizure -No recent seizure,  continue Keppra 500 mg 1 tab twice a day     Family/ staff Communication: Discussed plan of care with resident and charge nurse.  Labs/tests ordered: Liver panel  Goals of care:   Long-term care   Durenda Age, NP [facility] [phone] (Monday-Friday 8:00 a.m. - 5:00 p.m.) [phone] (after hours)

## 2018-04-29 ENCOUNTER — Other Ambulatory Visit: Payer: Self-pay | Admitting: Adult Health

## 2018-04-29 ENCOUNTER — Encounter
Admission: RE | Admit: 2018-04-29 | Discharge: 2018-04-29 | Disposition: A | Discharge status: Home / Self Care | Payer: Medicare Other | Source: Ambulatory Visit | Attending: Internal Medicine | Admitting: Internal Medicine

## 2018-04-29 MED ORDER — TRAMADOL HCL 50 MG PO TABS: 50.0000 mg | ORAL_TABLET | Freq: Two times a day (BID) | ORAL | 0 refills | Status: DC

## 2018-04-30 ENCOUNTER — Encounter: Payer: Self-pay | Admitting: Adult Health

## 2018-04-30 ENCOUNTER — Non-Acute Institutional Stay (SKILLED_NURSING_FACILITY): Payer: Medicare Other | Admitting: Adult Health

## 2018-04-30 DIAGNOSIS — I480 Paroxysmal atrial fibrillation: Secondary | ICD-10-CM | POA: Diagnosis not present

## 2018-04-30 DIAGNOSIS — I5032 Chronic diastolic (congestive) heart failure: Secondary | ICD-10-CM

## 2018-04-30 DIAGNOSIS — G2581 Restless legs syndrome: Secondary | ICD-10-CM | POA: Diagnosis not present

## 2018-04-30 DIAGNOSIS — G40909 Epilepsy, unspecified, not intractable, without status epilepticus: Secondary | ICD-10-CM

## 2018-04-30 DIAGNOSIS — F322 Major depressive disorder, single episode, severe without psychotic features: Secondary | ICD-10-CM

## 2018-04-30 DIAGNOSIS — E1149 Type 2 diabetes mellitus with other diabetic neurological complication: Secondary | ICD-10-CM

## 2018-04-30 NOTE — Progress Notes (Signed)
Location:  [facility] Number: [id number] Place of Service:  SNF ([phone]) Provider:  Durenda Age, NP  Patient Care Team: Leone Haven, MD as PCP - General (Family Medicine) Kirk Ruths, MD (Internal Medicine) Nyoka Cowden Phylis Bougie, NP as Nurse Practitioner (Geriatric Medicine)  Extended Emergency Contact Information Primary Emergency Contact: [facility] Address: [address]          [place], [place] [address] [name] of [place] Phone: [phone] Work Phone: [phone] Mobile Phone: [phone] Relation: Daughter Secondary Emergency Contact: Marliss Czar, [place] [address] [name] of [place] Phone: [phone] Work Phone: [phone] Mobile Phone: [phone] Relation: Sister  Code Status:  DNR  Goals of care: Advanced Directive information Advanced Directives 04/18/2018  Does Patient Have a Medical Advance Directive? Yes  Type of Advance Directive Out of facility DNR (pink MOST or yellow form)  Does patient want to make changes to medical advance directive? No - Patient declined  Copy of [place] in Chart? -  Would patient like information on creating a medical advance directive? No - Patient declined  Pre-existing out of facility DNR order (yellow form or pink MOST form) Yellow form placed in chart (order not valid for inpatient use)     Chief Complaint  Patient presents with  . Medical Management of Chronic Issues    Routine [facility] visit    HPI:  Pt is an 85 y.o. female seen today for medical management of chronic diseases.  She is a long-term care resident of [facility]  She has a PMH of anxiety, asthma, CAD S/P percutaneous coronary angioplasty, CKD, HTN, stroke, and OSA. Her Prozac dosage was increased on 04/18/18 due to her crying out loud episodes. She was seen today and no crying was noted. She said that she feels better but still a little bit  depressed. Latest CBGs are stable at 166, 158, 195.   Past Medical History:  Diagnosis Date  . Abnormal nuclear stress test 06/21/2011   Inferolateral reversible defect;--> cardiac cath & PCI of CxOM, occluded RCA with collaterals;; followup Myoview 11/2012: Low risk. Fixed basal inferior artifact normal EF. No ischemia  . Anxiety   . Asthma   . Bilateral cataracts    Status post stroke or correction  . CAD S/P percutaneous coronary angioplasty 06/2011   s/P PCI to proximal OM1 w/ DES; occluded RCA with bridging and L-R collaterals (medical management)  . Chronic kidney disease (CKD), stage II (mild)    Related to current bladder infections (although diabetes cannot be excluded)  . Chronotropic incompetence with sinus node dysfunction [facility]) October 2013   On CPET test; beta blockers reduced  . Diabetes mellitus type 2, controlled ([place])    On oral medications  . Diverticulitis   . Essential hypertension    Allowing for permissive hypertension to avoid orthostatic hypotension  . GERD (gastroesophageal reflux disease)    On PPI  . Glaucoma   . History of syncope    Per EP - neurocardiogenic & not Bradycardia related (no PPM)  . History of unstable angina 06/13/2011   Jaw pain awakening from sleep -- Myoview --CATH --> PCI  . Hyperlipidemia with target LDL less than 70    HDL at goal, LDL not at goal, borderline triglycerides. On Crestor 20 mg  . Migraines   . OSA (obstructive sleep apnea)    hx bladder infections  .  Osteoarthritis   . PAF (paroxysmal atrial fibrillation) ([place]) 10-11/ 2014   CardioNet Event Monitor: NSR & S Brady -- Rates 50-100; Total A. fib burden 35 hours and 27 minutes. 1296 episodes, longest was 1 hour 29 minutes. Rate ranged from 52-169 beats per minute.  . Seasonal allergies   . Shortness of breath on exertion October 2013   2-D echo: Normal EF>55%, Gr 1DD, mild aortic sclerosis; Evaluated with CPET - peak VO2 97%; Chronotropic Incompetence (submaximal  effort)  . Spinal stenosis of lumbar region 11/2011  . Stroke ([place]) 06/2017  . Stroke ([place]) 11/2014  . Tachycardia-bradycardia syndrome ([place]) 11/2012  . Urge incontinence    Past Surgical History:  Procedure Laterality Date  . APPENDECTOMY    . BREAST BIOPSY     both breast  . cataracts     both eyes  . COLONOSCOPY    . CORONARY ANGIOPLASTY WITH STENT PLACEMENT  07/05/2011   Promus Element DES 2.5 mm x 16 mm-post dilated to 2.65 mm CX-Proximal OM1  . Holter Monitor  04/2015   Sinus rhythm with rates 52-149 BPM. Isolated PACs with rare couplets and bigeminy. Multiple short runs of PAT/PSVT. Arrhythmia run 120 bpm for 204 beats. 14% of time was in A. fib/flutter. - Reviewed by Dr. Caryl Comes. Not thought to be significant enough for her syncope.  Marland Kitchen KNEE ARTHROSCOPY WITH MEDIAL MENISECTOMY Right 06/24/2014   Procedure: RIGHT KNEE ARTHROSCOPY WITH partial lateral MENISECTOMY, abrasion chondroplasty of medial femoral condyle and patella, microfracture technique;  Surgeon: Latanya Maudlin, MD;  Location: [facility];  Service: Orthopedics;  Laterality: Right;  . LEFT HEART CATHETERIZATION WITH CORONARY ANGIOGRAM N/A 07/06/2011   Procedure: LEFT HEART CATHETERIZATION WITH CORONARY ANGIOGRAM;  Surgeon: Leonie Man, MD;  Location: [facility] CATH LAB: Unstable Angina --> Myoview wiht Inf-Lat Ischemia.  Proximal OM1 lesion --> PCI; 100% mid RCA with right to right and left to right bridging collaterals from circumflex RPL and LAD the PDA.  Marland Kitchen NM MYOVIEW LTD  October 2014    Low risk. Fixed basal inferior artifact normal EF. No ischemia --> (as compared to pre-PCI Myoview revealing inferolateral ischemia)  . rotator cuff Right   . TRANSTHORACIC ECHOCARDIOGRAM  11/2014   EF 65-70%, Mod LVH. Normal wall motion. Gr 1 DD. Normal valves.  Marland Kitchen VAGINAL HYSTERECTOMY      Allergies  Allergen Reactions  . Amlodipine Cough  . Clopidogrel Bisulfate Cough  . Kenalog [Triamcinolone Acetonide] Other (See Comments)    unknown    . Lisinopril Cough  . Losartan Cough  . Sulfa Antibiotics Hives and Rash    Outpatient Encounter Medications as of 04/30/2018  Medication Sig  . acetaminophen (TYLENOL) 650 MG CR tablet Take 650 mg by mouth every 6 (six) hours as needed for pain.  Marland Kitchen azelastine (ASTELIN) 0.1 % nasal spray Place 2 sprays into both nostrils at bedtime. Use in each nostril as directed   . bisacodyl (DULCOLAX) 10 MG suppository Place 10 mg rectally daily as needed for moderate constipation.  . docusate sodium (STOOL SOFTENER) 100 MG capsule Take 100 mg by mouth daily.   Marland Kitchen FLUoxetine (PROZAC) 40 MG capsule Take 40 mg by mouth daily.  . furosemide (LASIX) 20 MG tablet Take 20 mg by mouth daily.   Marland Kitchen gabapentin (NEURONTIN) 300 MG capsule Take 300 mg by mouth 3 (three) times daily.   Marland Kitchen glucagon (GLUCAGON EMERGENCY) 1 MG injection Inject 1 mg into the vein once as needed. Give 1 mg IM for Hypoglycemia (  symptomatic) not responding to oral glucose, snack. May repeat x 1 after 15 minutes. USE ONLY IF PT IS OR IS BECOMING UNRESPONSIVE  . GLUCERNA (GLUCERNA) LIQD Take 237 mLs by mouth 2 (two) times daily between meals.  Marland Kitchen HUMALOG KWIKPEN 100 UNIT/ML KiwkPen Inject 35 Units into the skin 3 (three) times daily with meals. Give with meals for cbg > = 150  . hydrocortisone cream 1 % Apply thin film topically to clean, dry hemorrhoids four times daily as needed  . Infant Care Products [facility] EX) Apply liberal amount topically to area of skin irritation prn. OK to leave at bedside.  . Insulin Glargine (TOUJEO MAX SOLOSTAR) 300 UNIT/ML SOPN Inject 55 Units into the skin at bedtime. Alternate abdominal injection sites.  . latanoprost (XALATAN) 0.005 % ophthalmic solution Place 1 drop into both eyes daily.   Marland Kitchen levETIRAcetam (KEPPRA) 500 MG tablet Take 1 tablet (500 mg total) by mouth 2 (two) times daily.  Marland Kitchen levocetirizine (XYZAL) 5 MG tablet Take 2.5 mg by mouth daily. Take 0.5 tablet to = 2.5 mg  . Lidocaine-Menthol (ICY HOT  LIDOCAINE PLUS MENTHOL) 4-1 % PTCH Apply 1 patch topically daily. apply to lower back in the a.m. for pain management  . Magnesium 500 MG CAPS Take 500 mg by mouth at bedtime.   . metoprolol tartrate (LOPRESSOR) 25 MG tablet Take 12.5 mg by mouth daily. Take 0.5 tablet to = 12.5 mg  . mirtazapine (REMERON SOL-TAB) 15 MG disintegrating tablet Take 0.5 tablets (7.5 mg total) by mouth at bedtime.  . nitroGLYCERIN (NITROSTAT) 0.4 MG SL tablet Place 0.4 mg under the tongue every 5 (five) minutes as needed for chest pain.  . NON FORMULARY Diet Order: Upgrade patient to dysphagia 2 consistencies (ground), continue thin liquids; Straws ok  . Nutritional Supplements (NUTRITIONAL SUPPLEMENT PO) Snack at bedtime  . omeprazole (PRILOSEC) 40 MG capsule Take 40 mg by mouth daily.  Marland Kitchen oxybutynin (DITROPAN) 5 MG tablet Take 5 mg by mouth 2 (two) times daily.  . OXYGEN Inhale 2 L/min into the lungs as needed.   . polyethylene glycol (MIRALAX / GLYCOLAX) packet Give 17 gm by mouth once every morning  . Rivaroxaban (XARELTO) 15 MG TABS tablet Take 15 mg by mouth every evening.   Marland Kitchen rOPINIRole (REQUIP) 0.5 MG tablet TAKE 1 TABLET BY MOUTH AT  BEDTIME  . senna (SENOKOT) 8.6 MG TABS tablet Take 2 tablets by mouth at bedtime.  . Skin Protectants, Misc. (ENDIT EX) Apply topically to buttock three times daily as needed  . timolol (TIMOPTIC) 0.5 % ophthalmic solution Place 1 drop into both eyes at bedtime.   . traMADol (ULTRAM) 50 MG tablet Take 1 tablet (50 mg total) by mouth every 12 (twelve) hours.  . trolamine salicylate (ASPERCREME) 10 % cream Apply 1 application topically 3 (three) times daily. Apply to right knee and ankle  . [DISCONTINUED] FLUoxetine (PROZAC) 20 MG tablet Take 20 mg by mouth daily.   No facility-administered encounter medications on file as of 04/30/2018.     Review of Systems  GENERAL: No change in appetite, no fatigue, no weight changes, no fever, chills or weakness MOUTH and THROAT: Denies  oral discomfort, gingival pain or bleeding RESPIRATORY: no cough, SOB, DOE, wheezing, hemoptysis CARDIAC: No chest pain, edema or palpitations GI: No abdominal pain, diarrhea, constipation, heart burn, nausea or vomiting GU: Denies dysuria, frequency, hematuria, incontinence, or discharge NEUROLOGICAL: Denies dizziness, syncope, numbness, or headache PSYCHIATRIC: +depression   Immunization History  Administered  Date(s) Administered  . HPV Bivalent 11/11/2016  . Influenza, High Dose Seasonal PF 10/31/2016  . Influenza,inj,Quad PF,6+ Mos 11/28/2012, 12/08/2014  . Influenza,inj,quad, With Preservative 11/27/2016  . Influenza-Unspecified 12/29/2014, 11/12/2015, 11/29/2017  . PPD Test 12/29/2014, 07/25/2017  . Pneumococcal Conjugate-13 02/27/2006, 10/31/2016  . Pneumococcal-Unspecified 12/29/2014, 10/28/2016  . Zoster 03/30/2006  . Zoster Recombinat (Shingrix) 10/25/2017   Pertinent  Health Maintenance Due  Topic Date Due  . HEMOGLOBIN A1C  08/30/2018  . URINE MICROALBUMIN  10/10/2018  . INFLUENZA VACCINE  Completed  . DEXA SCAN  Completed  . PNA vac Low Risk Adult  Completed  . FOOT EXAM  Discontinued  . OPHTHALMOLOGY EXAM  Discontinued   Fall Risk  02/23/2016 04/12/2015  Falls in the past year? Yes Yes  Number falls in past yr: 1 1  Injury with Fall? No No  Follow up Falls prevention discussed;Education provided -     Vitals:   04/30/18 1035  BP: (!) 142/60  Pulse: 88  Resp: 16  Temp: 98.1 F (36.7 C)  TempSrc: Oral  SpO2: 100%  Weight: 161 lb 3.2 oz (73.1 kg)  Height: 5\' 4"  (1.626 m)   Body mass index is 27.67 kg/m.  Physical Exam  GENERAL APPEARANCE: Well nourished. In no acute distress. Normal body habitus SKIN:  Skin is warm and dry.  MOUTH and THROAT: Lips are without lesions. Oral mucosa is moist and without lesions.  RESPIRATORY: Breathing is even & unlabored, BS CTAB CARDIAC: RRR, no murmur,no extra heart sounds, no edema GI: Abdomen soft, normal BS,  no masses, no tenderness EXTREMITIES:  Not able to move RLE NEUROLOGICAL: There is no tremor. Slurred speech. Alert to self and time, disoriented to place. PSYCHIATRIC: Affect and behavior are appropriate   Labs reviewed: Recent Labs    10/25/17 0630  12/26/17 1141 01/03/18 0620 01/14/18 0615  NA  --    < > 142 144 143  K  --    < > 3.7 3.4* 4.0  CL  --    < > 109 103 104  CO2  --    < > 24 29 32  GLUCOSE  --    < > 194* 90 65*  BUN  --    < > 14 18 22   CREATININE  --    < > 0.93 1.05* 1.06*  CALCIUM  --    < > 9.5 9.4 9.0  MG 2.1  --   --   --   --    < > = values in this interval not displayed.   Recent Labs    10/31/17 0620 12/21/17 1746 12/22/17 0344  AST 19 28 27   ALT 20 29 28   ALKPHOS 92 114 96  BILITOT 0.6 0.2* 0.5  PROT 6.7 6.6 6.4*  ALBUMIN 3.2* 3.1* 3.2*   Recent Labs    10/31/17 0620 12/21/17 1746  12/24/17 1905 12/26/17 1141 01/03/18 0620  WBC 10.2 10.4   < > 13.6* 12.0* 9.7  NEUTROABS 7.2* 6.9  --   --   --  5.9  HGB 11.1* 11.5*   < > 12.3 12.4 11.6*  HCT 32.5* 36.8   < > 37.6 38.5 36.0  MCV 93.8 100.3*   < > 95.9 98.0 98.9  PLT 309 317   < > 371 342 327   < > = values in this interval not displayed.   Lab Results  Component Value Date   TSH 3.223 12/22/2017   Lab Results  Component  Value Date   HGBA1C 7.2 (H) 03/01/2018   Lab Results  Component Value Date   CHOL 234 (H) 07/24/2017   HDL 33 (L) 07/24/2017   LDLCALC 157 (H) 07/24/2017   LDLDIRECT 106.0 05/18/2015   TRIG 218 (H) 07/24/2017   CHOLHDL 7.1 07/24/2017    Assessment/Plan  1. Diabetes mellitus type 2 with neurological manifestations ([place]) Lab Results  Component Value Date   HGBA1C 7.2 (H) 03/01/2018  -Continue Toujeo 300 unit/mL inject 55 units subcutaneously at bedtime, Humalog 100 unit/mL inject 35 units TID with meals for CBG for CBG >=150 and gabapentin 300 mg 1 capsule 3 times a day  2. Seizure disorder ([place]) -No recent seizure, continue levetiracetam 500 mg 1  tablet twice a day  3. PAF (paroxysmal atrial fibrillation) ([facility]);  CHA2DS2-VASc Score =6 on 15 mg Xarelto -Rate controlled, continue Xarelto 15 mg 1 tab daily, metoprolol tartrate 25 mg 1/2 tab = 12.5 mg daily  4. Restless legs syndrome (RLS) -Stable, continue ropinirole 0.5 mg 1 tab daily at bedtime  5. Chronic diastolic CHF (congestive heart failure) ([facility]) -No SOB, continue Lasix 20 mg 1 tab daily, metoprolol tartrate 25 mg 1/2 tab = 12.5 mg daily  6.  Depression, major single episode, severe ([place])  -Verbalized still having depression, recently increased Prozac dosage to 40 mg 1 tab daily    Family/ staff Communication:  Discussed plan of care.  Labs/tests ordered:  None  Goals of care:   Long-term care.   Durenda Age, NP [facility] and Adult [facility] [phone] (Monday-Friday 8:00 a.m. - 5:00 p.m.) [phone] (after hours)

## 2018-05-02 ENCOUNTER — Encounter: Payer: Self-pay | Admitting: Adult Health

## 2018-05-02 ENCOUNTER — Non-Acute Institutional Stay (SKILLED_NURSING_FACILITY): Payer: Medicare Other | Admitting: Adult Health

## 2018-05-02 DIAGNOSIS — N3941 Urge incontinence: Secondary | ICD-10-CM

## 2018-05-02 DIAGNOSIS — I480 Paroxysmal atrial fibrillation: Secondary | ICD-10-CM

## 2018-05-02 DIAGNOSIS — H409 Unspecified glaucoma: Secondary | ICD-10-CM

## 2018-05-02 DIAGNOSIS — F321 Major depressive disorder, single episode, moderate: Secondary | ICD-10-CM

## 2018-05-02 DIAGNOSIS — G8929 Other chronic pain: Secondary | ICD-10-CM

## 2018-05-02 DIAGNOSIS — G40909 Epilepsy, unspecified, not intractable, without status epilepticus: Secondary | ICD-10-CM

## 2018-05-02 DIAGNOSIS — Z8673 Personal history of transient ischemic attack (TIA), and cerebral infarction without residual deficits: Secondary | ICD-10-CM

## 2018-05-02 DIAGNOSIS — G2581 Restless legs syndrome: Secondary | ICD-10-CM

## 2018-05-02 DIAGNOSIS — E1149 Type 2 diabetes mellitus with other diabetic neurological complication: Secondary | ICD-10-CM

## 2018-05-02 DIAGNOSIS — K219 Gastro-esophageal reflux disease without esophagitis: Secondary | ICD-10-CM

## 2018-05-02 DIAGNOSIS — I5032 Chronic diastolic (congestive) heart failure: Secondary | ICD-10-CM | POA: Diagnosis not present

## 2018-05-02 MED ORDER — TRAMADOL HCL 50 MG PO TABS: 50.0000 mg | ORAL_TABLET | Freq: Two times a day (BID) | ORAL | 0 refills | Status: DC

## 2018-05-02 NOTE — Progress Notes (Signed)
Location:  [facility] Number: [id number] Place of Service:  SNF ([phone]) Provider:  Durenda Age, NP  Patient Care Team: Abigail Haven, MD as PCP - General (Family Medicine) Abigail Ruths, MD (Internal Medicine) Abigail Boyd Abigail Bougie, NP as Nurse Practitioner (Geriatric Medicine)  Extended Emergency Contact Information Primary Emergency Contact: Abigail Boyd Address: [address]          [place], [place] [address] Abigail Boyd of [place] Phone: [phone] Work Phone: [phone] Mobile Phone: [phone] Relation: Daughter Secondary Emergency Contact: Abigail Boyd, [place] [address] Abigail Boyd of [place] Phone: [phone] Work Phone: [phone] Mobile Phone: [phone] Relation: Sister  Code Status:  DNR  Goals of care: Advanced Directive information Advanced Directives 04/30/2018  Does Patient Have a Medical Advance Directive? Yes  Type of Advance Directive Out of facility DNR (pink MOST or yellow form)  Does patient want to make changes to medical advance directive? No - Patient declined  Copy of [place] in Chart? -  Would patient like information on creating a medical advance directive? No - Patient declined  Pre-existing out of facility DNR order (yellow form or pink MOST form) -     Chief Complaint  Patient presents with  . Discharge Note    Patient is to transfer to [facility] on 05/03/18    HPI:  Pt is an 85 y.o. female seen today for discharge.  She is to transfer to [facility] on 05/03/2018. She has a PMH of anxiety, asthma, CAD S/P percutaneous coronary angioplasty, CKD, HTN, stroke, and OSA.   Past Medical History:  Diagnosis Date  . Abnormal nuclear stress test 06/21/2011   Inferolateral reversible defect;--> cardiac cath & PCI of CxOM, occluded RCA with collaterals;; followup Myoview 11/2012: Low risk. Fixed basal inferior artifact normal EF. No  ischemia  . Anxiety   . Asthma   . Bilateral cataracts    Status post stroke or correction  . CAD S/P percutaneous coronary angioplasty 06/2011   s/P PCI to proximal OM1 w/ DES; occluded RCA with bridging and L-R collaterals (medical management)  . Chronic kidney disease (CKD), stage II (mild)    Related to current bladder infections (although diabetes cannot be excluded)  . Chronotropic incompetence with sinus node dysfunction [facility]) October 2013   On CPET test; beta blockers reduced  . Diabetes mellitus type 2, controlled ([place])    On oral medications  . Diverticulitis   . Essential hypertension    Allowing for permissive hypertension to avoid orthostatic hypotension  . GERD (gastroesophageal reflux disease)    On PPI  . Glaucoma   . History of syncope    Per EP - neurocardiogenic & not Bradycardia related (no PPM)  . History of unstable angina 06/13/2011   Jaw pain awakening from sleep -- Myoview --CATH --> PCI  . Hyperlipidemia with target LDL less than 70    HDL at goal, LDL not at goal, borderline triglycerides. On Crestor 20 mg  . Migraines   . OSA (obstructive sleep apnea)    hx bladder infections  . Osteoarthritis   . PAF (paroxysmal atrial fibrillation) (Abigail Boyd) 10-11/ 2014   CardioNet Event Monitor: NSR & S Brady -- Rates 50-100; Total A. fib burden 35 hours and 27 minutes. 1296 episodes, longest was 1 hour 29 minutes. Rate ranged from 52-169 beats per minute.  . Seasonal allergies   . Shortness  of breath on exertion October 2013   2-D echo: Normal EF>55%, Gr 1DD, mild aortic sclerosis; Evaluated with CPET - peak VO2 97%; Chronotropic Incompetence (submaximal effort)  . Spinal stenosis of lumbar region 11/2011  . Stroke ([place]) 06/2017  . Stroke (Abigail Boyd) 11/2014  . Tachycardia-bradycardia syndrome ([place]) 11/2012  . Urge incontinence    Past Surgical History:  Procedure Laterality Date  . APPENDECTOMY    . BREAST BIOPSY     both breast  . cataracts     both eyes  .  COLONOSCOPY    . CORONARY ANGIOPLASTY WITH STENT PLACEMENT  07/05/2011   Promus Element DES 2.5 mm x 16 mm-post dilated to 2.65 mm CX-Proximal OM1  . Holter Monitor  04/2015   Sinus rhythm with rates 52-149 BPM. Isolated PACs with rare couplets and bigeminy. Multiple short runs of PAT/PSVT. Arrhythmia run 120 bpm for 204 beats. 14% of time was in A. fib/flutter. - Reviewed by Dr. Caryl Comes. Not thought to be significant enough for her syncope.  Abigail Boyd KNEE ARTHROSCOPY WITH MEDIAL MENISECTOMY Right 06/24/2014   Procedure: RIGHT KNEE ARTHROSCOPY WITH partial lateral MENISECTOMY, abrasion chondroplasty of medial femoral condyle and patella, microfracture technique;  Surgeon: Latanya Maudlin, MD;  Location: [facility] ORS;  Service: Orthopedics;  Laterality: Right;  . LEFT HEART CATHETERIZATION WITH CORONARY ANGIOGRAM N/A 07/06/2011   Procedure: LEFT HEART CATHETERIZATION WITH CORONARY ANGIOGRAM;  Surgeon: Leonie Man, MD;  Location: [facility] CATH LAB: Unstable Angina --> Myoview wiht Inf-Lat Ischemia.  Proximal OM1 lesion --> PCI; 100% mid RCA with right to right and left to right bridging collaterals from circumflex RPL and LAD the PDA.  Abigail Boyd NM MYOVIEW LTD  October 2014    Low risk. Fixed basal inferior artifact normal EF. No ischemia --> (as compared to pre-PCI Myoview revealing inferolateral ischemia)  . rotator cuff Right   . TRANSTHORACIC ECHOCARDIOGRAM  11/2014   EF 65-70%, Mod LVH. Normal wall motion. Gr 1 DD. Normal valves.  Abigail Boyd VAGINAL HYSTERECTOMY      Allergies  Allergen Reactions  . Amlodipine Cough  . Clopidogrel Bisulfate Cough  . Kenalog [Triamcinolone Acetonide] Other (See Comments)    unknown  . Lisinopril Cough  . Losartan Cough  . Sulfa Antibiotics Hives and Rash    Outpatient Encounter Medications as of 05/02/2018  Medication Sig  . acetaminophen (TYLENOL) 650 MG CR tablet Take 650 mg by mouth every 6 (six) hours as needed for pain.  Abigail Boyd azelastine (ASTELIN) 0.1 % nasal spray Place 2 sprays into both  nostrils at bedtime. Use in each nostril as directed   . bisacodyl (DULCOLAX) 10 MG suppository Place 10 mg rectally daily as needed for moderate constipation.  . carteolol (OCUPRESS) 1 % ophthalmic solution Place 1 drop into both eyes 2 (two) times daily.  Abigail Boyd docusate sodium (STOOL SOFTENER) 100 MG capsule Take 100 mg by mouth daily.   Abigail Boyd FLUoxetine (PROZAC) 40 MG capsule Take 40 mg by mouth daily.  . furosemide (LASIX) 20 MG tablet Take 20 mg by mouth daily.   Abigail Boyd gabapentin (NEURONTIN) 300 MG capsule Take 300 mg by mouth 3 (three) times daily.   Abigail Boyd glucagon (GLUCAGON EMERGENCY) 1 MG injection Inject 1 mg into the vein once as needed. Give 1 mg IM for Hypoglycemia (symptomatic) not responding to oral glucose, snack. May repeat x 1 after 15 minutes. USE ONLY IF PT IS OR IS BECOMING UNRESPONSIVE  . GLUCERNA (GLUCERNA) LIQD Take 237 mLs by mouth 2 (two) times daily between  meals.  Abigail Boyd HUMALOG KWIKPEN 100 UNIT/ML KiwkPen Inject 35 Units into the skin 3 (three) times daily with meals. Give with meals for cbg > = 150  . hydrocortisone cream 1 % Apply thin film topically to clean, dry hemorrhoids four times daily as needed  . Infant Care Products [facility] EX) Apply liberal amount topically to area of skin irritation prn. OK to leave at bedside.  . Insulin Glargine (TOUJEO MAX SOLOSTAR) 300 UNIT/ML SOPN Inject 55 Units into the skin at bedtime. Alternate abdominal injection sites.  . latanoprost (XALATAN) 0.005 % ophthalmic solution Place 1 drop into both eyes daily.   Abigail Boyd levETIRAcetam (KEPPRA) 500 MG tablet Take 1 tablet (500 mg total) by mouth 2 (two) times daily.  Abigail Boyd levocetirizine (XYZAL) 5 MG tablet Take 2.5 mg by mouth daily. Take 0.5 tablet to = 2.5 mg  . Lidocaine-Menthol (ICY HOT LIDOCAINE PLUS MENTHOL) 4-1 % PTCH Apply 1 patch topically daily. apply to lower back in the a.m. for pain management  . Magnesium 500 MG CAPS Take 500 mg by mouth at bedtime.   . metoprolol tartrate (LOPRESSOR) 25 MG tablet  Take 12.5 mg by mouth daily. Take 0.5 tablet to = 12.5 mg  . mirtazapine (REMERON SOL-TAB) 15 MG disintegrating tablet Take 0.5 tablets (7.5 mg total) by mouth at bedtime.  . nitroGLYCERIN (NITROSTAT) 0.4 MG SL tablet Place 0.4 mg under the tongue every 5 (five) minutes as needed for chest pain.  . NON FORMULARY Diet Order: Upgrade patient to dysphagia 2 consistencies (ground), continue thin liquids; Straws ok  . Nutritional Supplements (NUTRITIONAL SUPPLEMENT PO) Snack at bedtime  . omeprazole (PRILOSEC) 40 MG capsule Take 40 mg by mouth daily.   Abigail Boyd oxybutynin (DITROPAN) 5 MG tablet Take 5 mg by mouth 2 (two) times daily.  . OXYGEN Inhale 2 L/min into the lungs as needed.   . polyethylene glycol (MIRALAX / GLYCOLAX) packet Give 17 gm by mouth once every morning  . Rivaroxaban (XARELTO) 15 MG TABS tablet Take 15 mg by mouth every evening.   Abigail Boyd rOPINIRole (REQUIP) 0.5 MG tablet TAKE 1 TABLET BY MOUTH AT  BEDTIME  . senna (SENOKOT) 8.6 MG TABS tablet Take 2 tablets by mouth at bedtime.  . Skin Protectants, Misc. (ENDIT EX) Apply topically to buttock three times daily as needed  . traMADol (ULTRAM) 50 MG tablet Take 1 tablet (50 mg total) by mouth every 12 (twelve) hours.  . trolamine salicylate (ASPERCREME) 10 % cream Apply 1 application topically 3 (three) times daily. Apply to right knee and ankle  . [DISCONTINUED] timolol (TIMOPTIC) 0.5 % ophthalmic solution Place 1 drop into both eyes at bedtime.    No facility-administered encounter medications on file as of 05/02/2018.     Review of Systems  GENERAL: No change in appetite, no fatigue, no weight changes, no fever, chills or weakness MOUTH and THROAT: Denies oral discomfort, gingival pain or bleeding RESPIRATORY: no cough, SOB, DOE, wheezing, hemoptysis CARDIAC: No chest pain, edema or palpitations GI: No abdominal pain, diarrhea, constipation, heart burn, nausea or vomiting GU: Denies dysuria, frequency, hematuria, or  discharge NEUROLOGICAL: Denies dizziness, syncope, numbness, or headache PSYCHIATRIC: + depression   Immunization History  Administered Date(s) Administered  . HPV Bivalent 11/11/2016  . Influenza, High Dose Seasonal PF 10/31/2016  . Influenza,inj,Quad PF,6+ Mos 11/28/2012, 12/08/2014  . Influenza,inj,quad, With Preservative 11/27/2016  . Influenza-Unspecified 12/29/2014, 11/12/2015, 11/29/2017  . PPD Test 12/29/2014, 07/25/2017  . Pneumococcal Conjugate-13 02/27/2006, 10/31/2016  . Pneumococcal-Unspecified 12/29/2014,  10/28/2016  . Zoster 03/30/2006  . Zoster Recombinat (Shingrix) 10/25/2017   Pertinent  Health Maintenance Due  Topic Date Due  . HEMOGLOBIN A1C  08/30/2018  . URINE MICROALBUMIN  10/10/2018  . INFLUENZA VACCINE  Completed  . DEXA SCAN  Completed  . PNA vac Low Risk Adult  Completed  . FOOT EXAM  Discontinued  . OPHTHALMOLOGY EXAM  Discontinued   Fall Risk  02/23/2016 04/12/2015  Falls in the past year? Yes Yes  Number falls in past yr: 1 1  Injury with Fall? No No  Follow up Falls prevention discussed;Education provided -     Vitals:   05/02/18 1100  BP: (!) 144/62  Pulse: 88  Resp: 16  Temp: 98.1 F (36.7 C)  TempSrc: Oral  SpO2: 100%  Weight: 167 lb 11.2 oz (76.1 kg)  Height: 5\' 4"  (1.626 m)   Body mass index is 28.79 kg/m.  Physical Exam  GENERAL APPEARANCE: Well nourished. In no acute distress. Normal body habitus SKIN:  Skin is warm and dry.  MOUTH and THROAT: Lips are without lesions. Oral mucosa is moist and without lesions.  RESPIRATORY: Breathing is even & unlabored, BS CTAB CARDIAC: RRR, no murmur,no extra heart sounds, no edema GI: Abdomen soft, normal BS, no masses, no tenderness EXTREMITIES: Unable to move RLE NEUROLOGICAL: There is no tremor. Speech is clear. Alert to self, disoriented to time and place. PSYCHIATRIC:  Affect and behavior are appropriate    Labs reviewed: Recent Labs    10/25/17 0630  12/26/17 1141  01/03/18 0620 01/14/18 0615  NA  --    < > 142 144 143  K  --    < > 3.7 3.4* 4.0  CL  --    < > 109 103 104  CO2  --    < > 24 29 32  GLUCOSE  --    < > 194* 90 65*  BUN  --    < > 14 18 22   CREATININE  --    < > 0.93 1.05* 1.06*  CALCIUM  --    < > 9.5 9.4 9.0  MG 2.1  --   --   --   --    < > = values in this interval not displayed.   Recent Labs    10/31/17 0620 12/21/17 1746 12/22/17 0344  AST 19 28 27   ALT 20 29 28   ALKPHOS 92 114 96  BILITOT 0.6 0.2* 0.5  PROT 6.7 6.6 6.4*  ALBUMIN 3.2* 3.1* 3.2*   Recent Labs    10/31/17 0620 12/21/17 1746  12/24/17 1905 12/26/17 1141 01/03/18 0620  WBC 10.2 10.4   < > 13.6* 12.0* 9.7  NEUTROABS 7.2* 6.9  --   --   --  5.9  HGB 11.1* 11.5*   < > 12.3 12.4 11.6*  HCT 32.5* 36.8   < > 37.6 Abigail.5 36.0  MCV 93.8 100.3*   < > 95.9 98.0 98.9  PLT 309 317   < > 371 342 327   < > = values in this interval not displayed.   Lab Results  Component Value Date   TSH 3.223 12/22/2017   Lab Results  Component Value Date   HGBA1C 7.2 (H) 03/01/2018   Lab Results  Component Value Date   CHOL 234 (H) 07/24/2017   HDL 33 (L) 07/24/2017   LDLCALC 157 (H) 07/24/2017   LDLDIRECT 106.0 05/18/2015   TRIG 218 (H) 07/24/2017   CHOLHDL 7.1  07/24/2017    Assessment/Plan  1. Seizure disorder ([place]) -No recent seizures, continue levetiracetam 500 mg 1 tablet twice a day  2. PAF (paroxysmal atrial fibrillation) (HCC);  CHA2DS2-VASc Score =6 on 15 mg Xarelto -Rate controlled, continue Xarelto 15 mg 1 tab daily  3. Chronic diastolic CHF (congestive heart failure) (HCC) -No SOB, continue Lasix 20 mg 1 tab daily  4. Diabetes mellitus type 2 with neurological manifestations ([place]) Lab Results  Component Value Date   HGBA1C 7.2 (H) 03/01/2018  -Continue Toujeo 300 unit/mL inject 55 units subcutaneously at bedtime Humalog 100 unit/mL inject 35 units subcutaneously 3 times daily with meals and gabapentin 300 mg 1 capsule 3 times a day   5.  Urge incontinence -Continue oxybutynin 5 mg 1 tab twice a day  6. RLS (restless legs syndrome) -Stable, continue ropinirole 0.5 mg 1 tab daily  7. Other chronic pain - traMADol (ULTRAM) 50 MG tablet; Take 1 tablet (50 mg total) by mouth every 12 (twelve) hours.  Dispense: 14 tablet; Refill: 0  8. Depression, major, single episode, moderate (HCC) -Still feeling  Depressed, continue mirtazapine 15 mg 1/2 tab = 7.5 mg at bedtime and Prozac 40 mg 1 capsule daily  9. History of CVA (cerebrovascular accident) -Stable, continue Xarelto 15 mg 1 tab daily  10. Gastroesophageal reflux disease without esophagitis -Continue omeprazole 40 mg 1 capsule daily  11. Glaucoma of both eyes, unspecified glaucoma type - carteolol (OCUPRESS) 1 % ophthalmic solution; Place 1 drop into both eyes 2 (two) times daily. -Continue latanoprost drops 0.005% 1 drop to both eyes   I have filled out patient's discharge paperwork and e-prescribed Tramadol.    DME provided: None  Total discharge time: Greater than 30 minutes Greater than 50% was spent in counseling and coordination of care.  Discharge time involved coordination of the discharge process with Education officer, museum and nursing staff.     Durenda Age, NP [facility] and Adult Medicine [phone] (Monday-Friday 8:00 a.m. - 5:00 p.m.) [phone] (after hours)

## 2018-05-06 DIAGNOSIS — N183 Chronic kidney disease, stage 3 (moderate): Secondary | ICD-10-CM | POA: Diagnosis not present

## 2018-05-06 DIAGNOSIS — Z794 Long term (current) use of insulin: Secondary | ICD-10-CM | POA: Diagnosis not present

## 2018-05-06 DIAGNOSIS — I4891 Unspecified atrial fibrillation: Secondary | ICD-10-CM | POA: Diagnosis not present

## 2018-05-06 DIAGNOSIS — E1122 Type 2 diabetes mellitus with diabetic chronic kidney disease: Secondary | ICD-10-CM | POA: Diagnosis not present

## 2018-05-06 DIAGNOSIS — I69320 Aphasia following cerebral infarction: Secondary | ICD-10-CM | POA: Diagnosis not present

## 2018-05-21 DIAGNOSIS — M6281 Muscle weakness (generalized): Secondary | ICD-10-CM | POA: Diagnosis not present

## 2018-05-21 DIAGNOSIS — I69351 Hemiplegia and hemiparesis following cerebral infarction affecting right dominant side: Secondary | ICD-10-CM | POA: Diagnosis not present

## 2018-05-22 DIAGNOSIS — I69351 Hemiplegia and hemiparesis following cerebral infarction affecting right dominant side: Secondary | ICD-10-CM | POA: Diagnosis not present

## 2018-05-22 DIAGNOSIS — M6281 Muscle weakness (generalized): Secondary | ICD-10-CM | POA: Diagnosis not present

## 2018-05-23 DIAGNOSIS — I69351 Hemiplegia and hemiparesis following cerebral infarction affecting right dominant side: Secondary | ICD-10-CM | POA: Diagnosis not present

## 2018-05-23 DIAGNOSIS — M6281 Muscle weakness (generalized): Secondary | ICD-10-CM | POA: Diagnosis not present

## 2018-05-24 DIAGNOSIS — I69351 Hemiplegia and hemiparesis following cerebral infarction affecting right dominant side: Secondary | ICD-10-CM | POA: Diagnosis not present

## 2018-05-24 DIAGNOSIS — M6281 Muscle weakness (generalized): Secondary | ICD-10-CM | POA: Diagnosis not present

## 2018-05-27 DIAGNOSIS — M6281 Muscle weakness (generalized): Secondary | ICD-10-CM | POA: Diagnosis not present

## 2018-05-27 DIAGNOSIS — I69351 Hemiplegia and hemiparesis following cerebral infarction affecting right dominant side: Secondary | ICD-10-CM | POA: Diagnosis not present

## 2018-05-28 DIAGNOSIS — M6281 Muscle weakness (generalized): Secondary | ICD-10-CM | POA: Diagnosis not present

## 2018-05-28 DIAGNOSIS — I69351 Hemiplegia and hemiparesis following cerebral infarction affecting right dominant side: Secondary | ICD-10-CM | POA: Diagnosis not present

## 2018-05-29 DIAGNOSIS — I69351 Hemiplegia and hemiparesis following cerebral infarction affecting right dominant side: Secondary | ICD-10-CM | POA: Diagnosis not present

## 2018-05-29 DIAGNOSIS — M6281 Muscle weakness (generalized): Secondary | ICD-10-CM | POA: Diagnosis not present

## 2018-05-30 DIAGNOSIS — I69351 Hemiplegia and hemiparesis following cerebral infarction affecting right dominant side: Secondary | ICD-10-CM | POA: Diagnosis not present

## 2018-05-30 DIAGNOSIS — M6281 Muscle weakness (generalized): Secondary | ICD-10-CM | POA: Diagnosis not present

## 2018-05-30 DIAGNOSIS — M25561 Pain in right knee: Secondary | ICD-10-CM | POA: Diagnosis not present

## 2018-05-30 DIAGNOSIS — M25551 Pain in right hip: Secondary | ICD-10-CM | POA: Diagnosis not present

## 2018-05-31 DIAGNOSIS — M6281 Muscle weakness (generalized): Secondary | ICD-10-CM | POA: Diagnosis not present

## 2018-05-31 DIAGNOSIS — I69351 Hemiplegia and hemiparesis following cerebral infarction affecting right dominant side: Secondary | ICD-10-CM | POA: Diagnosis not present

## 2018-06-03 DIAGNOSIS — M6281 Muscle weakness (generalized): Secondary | ICD-10-CM | POA: Diagnosis not present

## 2018-06-03 DIAGNOSIS — I69351 Hemiplegia and hemiparesis following cerebral infarction affecting right dominant side: Secondary | ICD-10-CM | POA: Diagnosis not present

## 2018-06-04 DIAGNOSIS — I69351 Hemiplegia and hemiparesis following cerebral infarction affecting right dominant side: Secondary | ICD-10-CM | POA: Diagnosis not present

## 2018-06-04 DIAGNOSIS — M6281 Muscle weakness (generalized): Secondary | ICD-10-CM | POA: Diagnosis not present

## 2018-06-05 DIAGNOSIS — M6281 Muscle weakness (generalized): Secondary | ICD-10-CM | POA: Diagnosis not present

## 2018-06-05 DIAGNOSIS — I69351 Hemiplegia and hemiparesis following cerebral infarction affecting right dominant side: Secondary | ICD-10-CM | POA: Diagnosis not present

## 2018-06-06 DIAGNOSIS — I69351 Hemiplegia and hemiparesis following cerebral infarction affecting right dominant side: Secondary | ICD-10-CM | POA: Diagnosis not present

## 2018-06-06 DIAGNOSIS — M6281 Muscle weakness (generalized): Secondary | ICD-10-CM | POA: Diagnosis not present

## 2018-06-07 DIAGNOSIS — I69351 Hemiplegia and hemiparesis following cerebral infarction affecting right dominant side: Secondary | ICD-10-CM | POA: Diagnosis not present

## 2018-06-07 DIAGNOSIS — M6281 Muscle weakness (generalized): Secondary | ICD-10-CM | POA: Diagnosis not present

## 2018-06-10 DIAGNOSIS — I69351 Hemiplegia and hemiparesis following cerebral infarction affecting right dominant side: Secondary | ICD-10-CM | POA: Diagnosis not present

## 2018-06-10 DIAGNOSIS — M6281 Muscle weakness (generalized): Secondary | ICD-10-CM | POA: Diagnosis not present

## 2018-06-11 DIAGNOSIS — I69351 Hemiplegia and hemiparesis following cerebral infarction affecting right dominant side: Secondary | ICD-10-CM | POA: Diagnosis not present

## 2018-06-11 DIAGNOSIS — M6281 Muscle weakness (generalized): Secondary | ICD-10-CM | POA: Diagnosis not present

## 2018-06-12 DIAGNOSIS — I69351 Hemiplegia and hemiparesis following cerebral infarction affecting right dominant side: Secondary | ICD-10-CM | POA: Diagnosis not present

## 2018-06-12 DIAGNOSIS — M6281 Muscle weakness (generalized): Secondary | ICD-10-CM | POA: Diagnosis not present

## 2018-06-13 DIAGNOSIS — E119 Type 2 diabetes mellitus without complications: Secondary | ICD-10-CM | POA: Diagnosis not present

## 2018-06-13 DIAGNOSIS — D649 Anemia, unspecified: Secondary | ICD-10-CM | POA: Diagnosis not present

## 2018-06-13 DIAGNOSIS — I69351 Hemiplegia and hemiparesis following cerebral infarction affecting right dominant side: Secondary | ICD-10-CM | POA: Diagnosis not present

## 2018-06-13 DIAGNOSIS — M6281 Muscle weakness (generalized): Secondary | ICD-10-CM | POA: Diagnosis not present

## 2018-06-13 DIAGNOSIS — E169 Disorder of pancreatic internal secretion, unspecified: Secondary | ICD-10-CM | POA: Diagnosis not present

## 2018-06-14 DIAGNOSIS — I69351 Hemiplegia and hemiparesis following cerebral infarction affecting right dominant side: Secondary | ICD-10-CM | POA: Diagnosis not present

## 2018-06-14 DIAGNOSIS — M6281 Muscle weakness (generalized): Secondary | ICD-10-CM | POA: Diagnosis not present

## 2018-06-17 DIAGNOSIS — Z79899 Other long term (current) drug therapy: Secondary | ICD-10-CM | POA: Diagnosis not present

## 2018-06-17 DIAGNOSIS — I69351 Hemiplegia and hemiparesis following cerebral infarction affecting right dominant side: Secondary | ICD-10-CM | POA: Diagnosis not present

## 2018-06-17 DIAGNOSIS — M6281 Muscle weakness (generalized): Secondary | ICD-10-CM | POA: Diagnosis not present

## 2018-06-17 DIAGNOSIS — E169 Disorder of pancreatic internal secretion, unspecified: Secondary | ICD-10-CM | POA: Diagnosis not present

## 2018-06-17 DIAGNOSIS — D649 Anemia, unspecified: Secondary | ICD-10-CM | POA: Diagnosis not present

## 2018-06-17 DIAGNOSIS — E119 Type 2 diabetes mellitus without complications: Secondary | ICD-10-CM | POA: Diagnosis not present

## 2018-06-18 DIAGNOSIS — I69351 Hemiplegia and hemiparesis following cerebral infarction affecting right dominant side: Secondary | ICD-10-CM | POA: Diagnosis not present

## 2018-06-18 DIAGNOSIS — M6281 Muscle weakness (generalized): Secondary | ICD-10-CM | POA: Diagnosis not present

## 2018-06-19 DIAGNOSIS — I69351 Hemiplegia and hemiparesis following cerebral infarction affecting right dominant side: Secondary | ICD-10-CM | POA: Diagnosis not present

## 2018-06-19 DIAGNOSIS — M6281 Muscle weakness (generalized): Secondary | ICD-10-CM | POA: Diagnosis not present

## 2018-06-20 DIAGNOSIS — I69351 Hemiplegia and hemiparesis following cerebral infarction affecting right dominant side: Secondary | ICD-10-CM | POA: Diagnosis not present

## 2018-06-20 DIAGNOSIS — M6281 Muscle weakness (generalized): Secondary | ICD-10-CM | POA: Diagnosis not present

## 2018-06-21 DIAGNOSIS — I69351 Hemiplegia and hemiparesis following cerebral infarction affecting right dominant side: Secondary | ICD-10-CM | POA: Diagnosis not present

## 2018-06-21 DIAGNOSIS — M6281 Muscle weakness (generalized): Secondary | ICD-10-CM | POA: Diagnosis not present

## 2018-06-21 DIAGNOSIS — D649 Anemia, unspecified: Secondary | ICD-10-CM | POA: Diagnosis not present

## 2018-06-21 DIAGNOSIS — I10 Essential (primary) hypertension: Secondary | ICD-10-CM | POA: Diagnosis not present

## 2018-06-22 DIAGNOSIS — N390 Urinary tract infection, site not specified: Secondary | ICD-10-CM | POA: Diagnosis not present

## 2018-06-22 DIAGNOSIS — Z79899 Other long term (current) drug therapy: Secondary | ICD-10-CM | POA: Diagnosis not present

## 2018-06-22 DIAGNOSIS — R319 Hematuria, unspecified: Secondary | ICD-10-CM | POA: Diagnosis not present

## 2018-06-24 DIAGNOSIS — M6281 Muscle weakness (generalized): Secondary | ICD-10-CM | POA: Diagnosis not present

## 2018-06-24 DIAGNOSIS — I69351 Hemiplegia and hemiparesis following cerebral infarction affecting right dominant side: Secondary | ICD-10-CM | POA: Diagnosis not present

## 2018-06-25 DIAGNOSIS — I69351 Hemiplegia and hemiparesis following cerebral infarction affecting right dominant side: Secondary | ICD-10-CM | POA: Diagnosis not present

## 2018-06-25 DIAGNOSIS — M6281 Muscle weakness (generalized): Secondary | ICD-10-CM | POA: Diagnosis not present

## 2018-06-26 DIAGNOSIS — I5032 Chronic diastolic (congestive) heart failure: Secondary | ICD-10-CM | POA: Diagnosis not present

## 2018-06-26 DIAGNOSIS — I69391 Dysphagia following cerebral infarction: Secondary | ICD-10-CM | POA: Diagnosis not present

## 2018-06-26 DIAGNOSIS — I69320 Aphasia following cerebral infarction: Secondary | ICD-10-CM | POA: Diagnosis not present

## 2018-06-26 DIAGNOSIS — G4733 Obstructive sleep apnea (adult) (pediatric): Secondary | ICD-10-CM | POA: Diagnosis not present

## 2018-06-26 DIAGNOSIS — M1990 Unspecified osteoarthritis, unspecified site: Secondary | ICD-10-CM | POA: Diagnosis not present

## 2018-06-26 DIAGNOSIS — E1140 Type 2 diabetes mellitus with diabetic neuropathy, unspecified: Secondary | ICD-10-CM | POA: Diagnosis not present

## 2018-06-26 DIAGNOSIS — R1310 Dysphagia, unspecified: Secondary | ICD-10-CM | POA: Diagnosis not present

## 2018-06-26 DIAGNOSIS — G43909 Migraine, unspecified, not intractable, without status migrainosus: Secondary | ICD-10-CM | POA: Diagnosis not present

## 2018-06-26 DIAGNOSIS — I495 Sick sinus syndrome: Secondary | ICD-10-CM | POA: Diagnosis not present

## 2018-06-26 DIAGNOSIS — N390 Urinary tract infection, site not specified: Secondary | ICD-10-CM | POA: Diagnosis not present

## 2018-06-26 DIAGNOSIS — E1122 Type 2 diabetes mellitus with diabetic chronic kidney disease: Secondary | ICD-10-CM | POA: Diagnosis not present

## 2018-06-26 DIAGNOSIS — Z7901 Long term (current) use of anticoagulants: Secondary | ICD-10-CM | POA: Diagnosis not present

## 2018-06-26 DIAGNOSIS — I4891 Unspecified atrial fibrillation: Secondary | ICD-10-CM | POA: Diagnosis not present

## 2018-06-26 DIAGNOSIS — K219 Gastro-esophageal reflux disease without esophagitis: Secondary | ICD-10-CM | POA: Diagnosis not present

## 2018-06-26 DIAGNOSIS — M48061 Spinal stenosis, lumbar region without neurogenic claudication: Secondary | ICD-10-CM | POA: Diagnosis not present

## 2018-06-26 DIAGNOSIS — N183 Chronic kidney disease, stage 3 (moderate): Secondary | ICD-10-CM | POA: Diagnosis not present

## 2018-06-26 DIAGNOSIS — Z794 Long term (current) use of insulin: Secondary | ICD-10-CM | POA: Diagnosis not present

## 2018-06-26 DIAGNOSIS — L89610 Pressure ulcer of right heel, unstageable: Secondary | ICD-10-CM | POA: Diagnosis not present

## 2018-06-26 DIAGNOSIS — K5792 Diverticulitis of intestine, part unspecified, without perforation or abscess without bleeding: Secondary | ICD-10-CM | POA: Diagnosis not present

## 2018-06-26 DIAGNOSIS — I69351 Hemiplegia and hemiparesis following cerebral infarction affecting right dominant side: Secondary | ICD-10-CM | POA: Diagnosis not present

## 2018-06-26 DIAGNOSIS — G894 Chronic pain syndrome: Secondary | ICD-10-CM | POA: Diagnosis not present

## 2018-06-26 DIAGNOSIS — J449 Chronic obstructive pulmonary disease, unspecified: Secondary | ICD-10-CM | POA: Diagnosis not present

## 2018-06-26 DIAGNOSIS — I130 Hypertensive heart and chronic kidney disease with heart failure and stage 1 through stage 4 chronic kidney disease, or unspecified chronic kidney disease: Secondary | ICD-10-CM | POA: Diagnosis not present

## 2018-07-29 DIAGNOSIS — I25119 Atherosclerotic heart disease of native coronary artery with unspecified angina pectoris: Secondary | ICD-10-CM | POA: Diagnosis not present

## 2018-07-29 DIAGNOSIS — I69320 Aphasia following cerebral infarction: Secondary | ICD-10-CM | POA: Diagnosis not present

## 2018-07-29 DIAGNOSIS — E1122 Type 2 diabetes mellitus with diabetic chronic kidney disease: Secondary | ICD-10-CM | POA: Diagnosis not present

## 2018-07-29 DIAGNOSIS — N183 Chronic kidney disease, stage 3 (moderate): Secondary | ICD-10-CM | POA: Diagnosis not present

## 2018-07-29 DIAGNOSIS — I4891 Unspecified atrial fibrillation: Secondary | ICD-10-CM | POA: Diagnosis not present

## 2018-08-13 DIAGNOSIS — M79671 Pain in right foot: Secondary | ICD-10-CM | POA: Diagnosis not present

## 2018-08-14 DIAGNOSIS — E1165 Type 2 diabetes mellitus with hyperglycemia: Secondary | ICD-10-CM | POA: Diagnosis not present

## 2018-08-14 DIAGNOSIS — D649 Anemia, unspecified: Secondary | ICD-10-CM | POA: Diagnosis not present

## 2018-08-20 ENCOUNTER — Encounter: Payer: Medicare Other | Attending: Physician Assistant | Admitting: Physician Assistant

## 2018-08-20 ENCOUNTER — Inpatient Hospital Stay: Payer: Medicare Other

## 2018-08-20 ENCOUNTER — Inpatient Hospital Stay
Admission: EM | Admit: 2018-08-20 | Discharge: 2018-09-02 | DRG: 854 | Disposition: A | Discharge status: Skilled Nursing Facility | Payer: Medicare Other | Attending: Internal Medicine | Admitting: Internal Medicine

## 2018-08-20 ENCOUNTER — Other Ambulatory Visit: Payer: Self-pay

## 2018-08-20 ENCOUNTER — Emergency Department: Payer: Medicare Other

## 2018-08-20 DIAGNOSIS — E1122 Type 2 diabetes mellitus with diabetic chronic kidney disease: Secondary | ICD-10-CM | POA: Diagnosis not present

## 2018-08-20 DIAGNOSIS — G43909 Migraine, unspecified, not intractable, without status migrainosus: Secondary | ICD-10-CM | POA: Diagnosis not present

## 2018-08-20 DIAGNOSIS — L0390 Cellulitis, unspecified: Secondary | ICD-10-CM | POA: Diagnosis not present

## 2018-08-20 DIAGNOSIS — E119 Type 2 diabetes mellitus without complications: Secondary | ICD-10-CM | POA: Diagnosis not present

## 2018-08-20 DIAGNOSIS — I70268 Atherosclerosis of native arteries of extremities with gangrene, other extremity: Secondary | ICD-10-CM | POA: Diagnosis not present

## 2018-08-20 DIAGNOSIS — Z955 Presence of coronary angioplasty implant and graft: Secondary | ICD-10-CM | POA: Diagnosis not present

## 2018-08-20 DIAGNOSIS — A419 Sepsis, unspecified organism: Secondary | ICD-10-CM | POA: Diagnosis not present

## 2018-08-20 DIAGNOSIS — E11622 Type 2 diabetes mellitus with other skin ulcer: Secondary | ICD-10-CM | POA: Diagnosis present

## 2018-08-20 DIAGNOSIS — M7661 Achilles tendinitis, right leg: Secondary | ICD-10-CM | POA: Diagnosis not present

## 2018-08-20 DIAGNOSIS — I130 Hypertensive heart and chronic kidney disease with heart failure and stage 1 through stage 4 chronic kidney disease, or unspecified chronic kidney disease: Secondary | ICD-10-CM | POA: Diagnosis present

## 2018-08-20 DIAGNOSIS — L97419 Non-pressure chronic ulcer of right heel and midfoot with unspecified severity: Secondary | ICD-10-CM | POA: Diagnosis not present

## 2018-08-20 DIAGNOSIS — Z7401 Bed confinement status: Secondary | ICD-10-CM

## 2018-08-20 DIAGNOSIS — Z825 Family history of asthma and other chronic lower respiratory diseases: Secondary | ICD-10-CM | POA: Diagnosis not present

## 2018-08-20 DIAGNOSIS — I10 Essential (primary) hypertension: Secondary | ICD-10-CM | POA: Diagnosis not present

## 2018-08-20 DIAGNOSIS — J45909 Unspecified asthma, uncomplicated: Secondary | ICD-10-CM | POA: Diagnosis present

## 2018-08-20 DIAGNOSIS — R32 Unspecified urinary incontinence: Secondary | ICD-10-CM | POA: Diagnosis present

## 2018-08-20 DIAGNOSIS — E876 Hypokalemia: Secondary | ICD-10-CM | POA: Diagnosis not present

## 2018-08-20 DIAGNOSIS — G4733 Obstructive sleep apnea (adult) (pediatric): Secondary | ICD-10-CM | POA: Diagnosis present

## 2018-08-20 DIAGNOSIS — E1152 Type 2 diabetes mellitus with diabetic peripheral angiopathy with gangrene: Secondary | ICD-10-CM | POA: Diagnosis not present

## 2018-08-20 DIAGNOSIS — L89102 Pressure ulcer of unspecified part of back, stage 2: Secondary | ICD-10-CM | POA: Diagnosis not present

## 2018-08-20 DIAGNOSIS — I96 Gangrene, not elsewhere classified: Secondary | ICD-10-CM | POA: Diagnosis not present

## 2018-08-20 DIAGNOSIS — Z20828 Contact with and (suspected) exposure to other viral communicable diseases: Secondary | ICD-10-CM | POA: Diagnosis present

## 2018-08-20 DIAGNOSIS — E1140 Type 2 diabetes mellitus with diabetic neuropathy, unspecified: Secondary | ICD-10-CM | POA: Diagnosis not present

## 2018-08-20 DIAGNOSIS — M869 Osteomyelitis, unspecified: Secondary | ICD-10-CM | POA: Diagnosis present

## 2018-08-20 DIAGNOSIS — Z832 Family history of diseases of the blood and blood-forming organs and certain disorders involving the immune mechanism: Secondary | ICD-10-CM

## 2018-08-20 DIAGNOSIS — L8990 Pressure ulcer of unspecified site, unspecified stage: Secondary | ICD-10-CM | POA: Insufficient documentation

## 2018-08-20 DIAGNOSIS — I495 Sick sinus syndrome: Secondary | ICD-10-CM | POA: Diagnosis not present

## 2018-08-20 DIAGNOSIS — M86171 Other acute osteomyelitis, right ankle and foot: Secondary | ICD-10-CM | POA: Diagnosis not present

## 2018-08-20 DIAGNOSIS — L89159 Pressure ulcer of sacral region, unspecified stage: Secondary | ICD-10-CM | POA: Diagnosis not present

## 2018-08-20 DIAGNOSIS — Z8673 Personal history of transient ischemic attack (TIA), and cerebral infarction without residual deficits: Secondary | ICD-10-CM

## 2018-08-20 DIAGNOSIS — H409 Unspecified glaucoma: Secondary | ICD-10-CM | POA: Diagnosis present

## 2018-08-20 DIAGNOSIS — I5032 Chronic diastolic (congestive) heart failure: Secondary | ICD-10-CM | POA: Diagnosis not present

## 2018-08-20 DIAGNOSIS — Z8249 Family history of ischemic heart disease and other diseases of the circulatory system: Secondary | ICD-10-CM

## 2018-08-20 DIAGNOSIS — Z66 Do not resuscitate: Secondary | ICD-10-CM | POA: Diagnosis present

## 2018-08-20 DIAGNOSIS — N182 Chronic kidney disease, stage 2 (mild): Secondary | ICD-10-CM | POA: Diagnosis not present

## 2018-08-20 DIAGNOSIS — I959 Hypotension, unspecified: Secondary | ICD-10-CM | POA: Diagnosis not present

## 2018-08-20 DIAGNOSIS — M86071 Acute hematogenous osteomyelitis, right ankle and foot: Secondary | ICD-10-CM | POA: Diagnosis not present

## 2018-08-20 DIAGNOSIS — Z794 Long term (current) use of insulin: Secondary | ICD-10-CM | POA: Diagnosis not present

## 2018-08-20 DIAGNOSIS — R0602 Shortness of breath: Secondary | ICD-10-CM | POA: Diagnosis not present

## 2018-08-20 DIAGNOSIS — L89619 Pressure ulcer of right heel, unspecified stage: Secondary | ICD-10-CM | POA: Diagnosis not present

## 2018-08-20 DIAGNOSIS — E785 Hyperlipidemia, unspecified: Secondary | ICD-10-CM | POA: Diagnosis present

## 2018-08-20 DIAGNOSIS — N183 Chronic kidney disease, stage 3 (moderate): Secondary | ICD-10-CM | POA: Diagnosis present

## 2018-08-20 DIAGNOSIS — I2510 Atherosclerotic heart disease of native coronary artery without angina pectoris: Secondary | ICD-10-CM | POA: Diagnosis not present

## 2018-08-20 DIAGNOSIS — Z7189 Other specified counseling: Secondary | ICD-10-CM | POA: Diagnosis not present

## 2018-08-20 DIAGNOSIS — M71571 Other bursitis, not elsewhere classified, right ankle and foot: Secondary | ICD-10-CM | POA: Diagnosis not present

## 2018-08-20 DIAGNOSIS — L89152 Pressure ulcer of sacral region, stage 2: Secondary | ICD-10-CM | POA: Diagnosis present

## 2018-08-20 DIAGNOSIS — I480 Paroxysmal atrial fibrillation: Secondary | ICD-10-CM | POA: Diagnosis not present

## 2018-08-20 DIAGNOSIS — I129 Hypertensive chronic kidney disease with stage 1 through stage 4 chronic kidney disease, or unspecified chronic kidney disease: Secondary | ICD-10-CM | POA: Diagnosis not present

## 2018-08-20 DIAGNOSIS — Z803 Family history of malignant neoplasm of breast: Secondary | ICD-10-CM

## 2018-08-20 DIAGNOSIS — M65271 Calcific tendinitis, right ankle and foot: Secondary | ICD-10-CM | POA: Diagnosis not present

## 2018-08-20 DIAGNOSIS — L89899 Pressure ulcer of other site, unspecified stage: Secondary | ICD-10-CM | POA: Diagnosis not present

## 2018-08-20 DIAGNOSIS — M1990 Unspecified osteoarthritis, unspecified site: Secondary | ICD-10-CM | POA: Diagnosis present

## 2018-08-20 DIAGNOSIS — Z7901 Long term (current) use of anticoagulants: Secondary | ICD-10-CM

## 2018-08-20 DIAGNOSIS — F0390 Unspecified dementia without behavioral disturbance: Secondary | ICD-10-CM | POA: Diagnosis present

## 2018-08-20 DIAGNOSIS — E1169 Type 2 diabetes mellitus with other specified complication: Secondary | ICD-10-CM | POA: Diagnosis not present

## 2018-08-20 DIAGNOSIS — K219 Gastro-esophageal reflux disease without esophagitis: Secondary | ICD-10-CM | POA: Diagnosis not present

## 2018-08-20 DIAGNOSIS — L89610 Pressure ulcer of right heel, unstageable: Secondary | ICD-10-CM | POA: Diagnosis not present

## 2018-08-20 DIAGNOSIS — I25110 Atherosclerotic heart disease of native coronary artery with unstable angina pectoris: Secondary | ICD-10-CM | POA: Diagnosis present

## 2018-08-20 DIAGNOSIS — Z515 Encounter for palliative care: Secondary | ICD-10-CM | POA: Diagnosis not present

## 2018-08-20 DIAGNOSIS — L98491 Non-pressure chronic ulcer of skin of other sites limited to breakdown of skin: Secondary | ICD-10-CM | POA: Diagnosis present

## 2018-08-20 DIAGNOSIS — M2550 Pain in unspecified joint: Secondary | ICD-10-CM | POA: Diagnosis not present

## 2018-08-20 DIAGNOSIS — L97416 Non-pressure chronic ulcer of right heel and midfoot with bone involvement without evidence of necrosis: Secondary | ICD-10-CM | POA: Diagnosis not present

## 2018-08-20 DIAGNOSIS — M86671 Other chronic osteomyelitis, right ankle and foot: Secondary | ICD-10-CM | POA: Diagnosis not present

## 2018-08-20 DIAGNOSIS — Z823 Family history of stroke: Secondary | ICD-10-CM

## 2018-08-20 DIAGNOSIS — Z9862 Peripheral vascular angioplasty status: Secondary | ICD-10-CM | POA: Diagnosis not present

## 2018-08-20 DIAGNOSIS — I70261 Atherosclerosis of native arteries of extremities with gangrene, right leg: Secondary | ICD-10-CM | POA: Diagnosis not present

## 2018-08-20 DIAGNOSIS — L97516 Non-pressure chronic ulcer of other part of right foot with bone involvement without evidence of necrosis: Secondary | ICD-10-CM | POA: Diagnosis not present

## 2018-08-20 DIAGNOSIS — F329 Major depressive disorder, single episode, unspecified: Secondary | ICD-10-CM | POA: Diagnosis present

## 2018-08-20 DIAGNOSIS — L03312 Cellulitis of back [any part except buttock]: Secondary | ICD-10-CM | POA: Diagnosis not present

## 2018-08-20 DIAGNOSIS — G2581 Restless legs syndrome: Secondary | ICD-10-CM | POA: Diagnosis present

## 2018-08-20 DIAGNOSIS — R404 Transient alteration of awareness: Secondary | ICD-10-CM | POA: Diagnosis not present

## 2018-08-20 DIAGNOSIS — E11621 Type 2 diabetes mellitus with foot ulcer: Secondary | ICD-10-CM | POA: Diagnosis not present

## 2018-08-20 LAB — CBC WITH DIFFERENTIAL/PLATELET
Abs Immature Granulocytes: 0.23 10*3/uL — ABNORMAL HIGH (ref 0.00–0.07)
Basophils Absolute: 0.1 10*3/uL (ref 0.0–0.1)
Basophils Relative: 0 %
Eosinophils Absolute: 0.1 10*3/uL (ref 0.0–0.5)
Eosinophils Relative: 0 %
HCT: 33.7 % — ABNORMAL LOW (ref 36.0–46.0)
Hemoglobin: 10.5 g/dL — ABNORMAL LOW (ref 12.0–15.0)
Immature Granulocytes: 1 %
Lymphocytes Relative: 10 %
Lymphs Abs: 1.9 10*3/uL (ref 0.7–4.0)
MCH: 30.5 pg (ref 26.0–34.0)
MCHC: 31.2 g/dL (ref 30.0–36.0)
MCV: 98 fL (ref 80.0–100.0)
Monocytes Absolute: 1.3 10*3/uL — ABNORMAL HIGH (ref 0.1–1.0)
Monocytes Relative: 7 %
Neutro Abs: 15.6 10*3/uL — ABNORMAL HIGH (ref 1.7–7.7)
Neutrophils Relative %: 82 %
Platelets: 509 10*3/uL — ABNORMAL HIGH (ref 150–400)
RBC: 3.44 MIL/uL — ABNORMAL LOW (ref 3.87–5.11)
RDW: 13.2 % (ref 11.5–15.5)
WBC: 19.2 10*3/uL — ABNORMAL HIGH (ref 4.0–10.5)
nRBC: 0 % (ref 0.0–0.2)

## 2018-08-20 LAB — APTT: aPTT: 42 seconds — ABNORMAL HIGH (ref 24–36)

## 2018-08-20 LAB — COMPREHENSIVE METABOLIC PANEL
ALT: 20 U/L (ref 0–44)
AST: 22 U/L (ref 15–41)
Albumin: 2.8 g/dL — ABNORMAL LOW (ref 3.5–5.0)
Alkaline Phosphatase: 106 U/L (ref 38–126)
Anion gap: 12 (ref 5–15)
BUN: 41 mg/dL — ABNORMAL HIGH (ref 8–23)
CO2: 25 mmol/L (ref 22–32)
Calcium: 9 mg/dL (ref 8.9–10.3)
Chloride: 100 mmol/L (ref 98–111)
Creatinine, Ser: 1.24 mg/dL — ABNORMAL HIGH (ref 0.44–1.00)
GFR calc Af Amer: 46 mL/min — ABNORMAL LOW (ref >60)
GFR calc non Af Amer: 39 mL/min — ABNORMAL LOW (ref >60)
Glucose, Bld: 86 mg/dL (ref 70–99)
Potassium: 4 mmol/L (ref 3.5–5.1)
Sodium: 137 mmol/L (ref 135–145)
Total Bilirubin: 0.3 mg/dL (ref 0.3–1.2)
Total Protein: 7.6 g/dL (ref 6.5–8.1)

## 2018-08-20 LAB — SEDIMENTATION RATE: Sed Rate: 125 mm/hr — ABNORMAL HIGH (ref 0–30)

## 2018-08-20 LAB — GLUCOSE, CAPILLARY
Glucose-Capillary: 117 mg/dL — ABNORMAL HIGH (ref 70–99)
Glucose-Capillary: 163 mg/dL — ABNORMAL HIGH (ref 70–99)
Glucose-Capillary: 194 mg/dL — ABNORMAL HIGH (ref 70–99)

## 2018-08-20 LAB — LACTIC ACID, PLASMA
Lactic Acid, Venous: 3 mmol/L (ref 0.5–1.9)
Lactic Acid, Venous: 1.5 mmol/L (ref 0.5–1.9)

## 2018-08-20 LAB — C-REACTIVE PROTEIN: CRP: 19.7 mg/dL — ABNORMAL HIGH (ref <1.0)

## 2018-08-20 LAB — SARS CORONAVIRUS 2 BY RT PCR (HOSPITAL ORDER, PERFORMED IN ~~LOC~~ HOSPITAL LAB): SARS Coronavirus 2: NEGATIVE

## 2018-08-20 MED ORDER — VANCOMYCIN VARIABLE DOSE PER UNSTABLE RENAL FUNCTION (PHARMACIST DOSING): Status: DC

## 2018-08-20 MED ORDER — SODIUM CHLORIDE 0.9 % IV SOLN: Freq: Once | INTRAVENOUS | Status: DC

## 2018-08-20 MED ORDER — TIMOLOL MALEATE 0.5 % OP SOLN
1.0000 [drp] | Freq: Every day | OPHTHALMIC | Status: DC
  Administered 2018-08-20 – 2018-09-01 (×13): 1 [drp] via OPHTHALMIC
  Filled 2018-08-20: qty 5

## 2018-08-20 MED ORDER — SODIUM CHLORIDE 0.9 % IV SOLN
2.0000 g | Freq: Once | INTRAVENOUS | Status: AC
  Administered 2018-08-20: 2 g via INTRAVENOUS
  Filled 2018-08-20: qty 2

## 2018-08-20 MED ORDER — PANTOPRAZOLE SODIUM 40 MG PO TBEC
40.0000 mg | DELAYED_RELEASE_TABLET | Freq: Every day | ORAL | Status: DC
  Administered 2018-08-21 – 2018-09-02 (×11): 40 mg via ORAL
  Filled 2018-08-20 (×12): qty 1

## 2018-08-20 MED ORDER — SODIUM CHLORIDE 0.9 % IV BOLUS
1000.0000 mL | Freq: Once | INTRAVENOUS | Status: AC
  Administered 2018-08-20: 1000 mL via INTRAVENOUS

## 2018-08-20 MED ORDER — OXYBUTYNIN CHLORIDE 5 MG PO TABS
5.0000 mg | ORAL_TABLET | Freq: Two times a day (BID) | ORAL | Status: DC
  Administered 2018-08-20 – 2018-09-02 (×22): 5 mg via ORAL
  Filled 2018-08-20 (×27): qty 1

## 2018-08-20 MED ORDER — DOCUSATE SODIUM 100 MG PO CAPS
100.0000 mg | ORAL_CAPSULE | Freq: Every day | ORAL | Status: DC
  Administered 2018-08-21 – 2018-09-02 (×8): 100 mg via ORAL
  Filled 2018-08-20 (×11): qty 1

## 2018-08-20 MED ORDER — NITROGLYCERIN 0.4 MG SL SUBL: 0.4000 mg | SUBLINGUAL_TABLET | SUBLINGUAL | Status: DC | PRN

## 2018-08-20 MED ORDER — GABAPENTIN 300 MG PO CAPS
300.0000 mg | ORAL_CAPSULE | Freq: Three times a day (TID) | ORAL | Status: DC
  Administered 2018-08-20 – 2018-09-02 (×29): 300 mg via ORAL
  Filled 2018-08-20 (×34): qty 1

## 2018-08-20 MED ORDER — ENOXAPARIN SODIUM 80 MG/0.8ML ~~LOC~~ SOLN
1.0000 mg/kg | SUBCUTANEOUS | Status: DC
  Filled 2018-08-20 (×2): qty 0.8

## 2018-08-20 MED ORDER — FLUOXETINE HCL 20 MG PO CAPS
40.0000 mg | ORAL_CAPSULE | Freq: Every day | ORAL | Status: DC
  Administered 2018-08-21 – 2018-09-02 (×11): 40 mg via ORAL
  Filled 2018-08-20 (×13): qty 2

## 2018-08-20 MED ORDER — MORPHINE SULFATE (PF) 2 MG/ML IV SOLN: 1.0000 mg | INTRAVENOUS | Status: DC | PRN

## 2018-08-20 MED ORDER — LATANOPROST 0.005 % OP SOLN
1.0000 [drp] | Freq: Every day | OPHTHALMIC | Status: DC
  Administered 2018-08-20 – 2018-09-01 (×12): 1 [drp] via OPHTHALMIC
  Filled 2018-08-20: qty 2.5

## 2018-08-20 MED ORDER — DOCUSATE SODIUM 100 MG PO CAPS: 100.0000 mg | ORAL_CAPSULE | Freq: Two times a day (BID) | ORAL | Status: DC | PRN

## 2018-08-20 MED ORDER — FUROSEMIDE 20 MG PO TABS
20.0000 mg | ORAL_TABLET | Freq: Every day | ORAL | Status: DC
  Administered 2018-08-21 – 2018-09-02 (×11): 20 mg via ORAL
  Filled 2018-08-20 (×12): qty 1

## 2018-08-20 MED ORDER — ROPINIROLE HCL 1 MG PO TABS
0.5000 mg | ORAL_TABLET | Freq: Every day | ORAL | Status: DC
  Administered 2018-08-20 – 2018-09-01 (×11): 0.5 mg via ORAL
  Filled 2018-08-20 (×13): qty 1

## 2018-08-20 MED ORDER — SENNA 8.6 MG PO TABS
2.0000 | ORAL_TABLET | Freq: Every day | ORAL | Status: DC
  Administered 2018-08-20 – 2018-09-01 (×10): 17.2 mg via ORAL
  Filled 2018-08-20 (×13): qty 2

## 2018-08-20 MED ORDER — LEVETIRACETAM 500 MG PO TABS
500.0000 mg | ORAL_TABLET | Freq: Two times a day (BID) | ORAL | Status: DC
  Administered 2018-08-20 – 2018-08-29 (×16): 500 mg via ORAL
  Filled 2018-08-20 (×21): qty 1

## 2018-08-20 MED ORDER — POLYETHYLENE GLYCOL 3350 17 G PO PACK
17.0000 g | PACK | Freq: Every day | ORAL | Status: DC
  Administered 2018-08-24 – 2018-09-02 (×5): 17 g via ORAL
  Filled 2018-08-20 (×7): qty 1

## 2018-08-20 MED ORDER — INSULIN ASPART 100 UNIT/ML ~~LOC~~ SOLN
0.0000 [IU] | Freq: Every day | SUBCUTANEOUS | Status: DC
  Administered 2018-08-31: 23:00:00 2 [IU] via SUBCUTANEOUS
  Filled 2018-08-20: qty 1

## 2018-08-20 MED ORDER — METOPROLOL TARTRATE 25 MG PO TABS
12.5000 mg | ORAL_TABLET | Freq: Every day | ORAL | Status: DC
  Administered 2018-08-21 – 2018-09-02 (×11): 12.5 mg via ORAL
  Filled 2018-08-20 (×12): qty 1

## 2018-08-20 MED ORDER — INSULIN GLARGINE 100 UNIT/ML ~~LOC~~ SOLN
55.0000 [IU] | Freq: Every day | SUBCUTANEOUS | Status: DC
  Administered 2018-08-20 – 2018-08-21 (×2): 55 [IU] via SUBCUTANEOUS
  Filled 2018-08-20 (×5): qty 0.55

## 2018-08-20 MED ORDER — SODIUM CHLORIDE 0.9 % IV SOLN
1.0000 g | INTRAVENOUS | Status: DC
  Filled 2018-08-20: qty 1

## 2018-08-20 MED ORDER — INSULIN ASPART 100 UNIT/ML ~~LOC~~ SOLN
0.0000 [IU] | Freq: Three times a day (TID) | SUBCUTANEOUS | Status: DC
  Administered 2018-08-20: 2 [IU] via SUBCUTANEOUS
  Administered 2018-08-21: 1 [IU] via SUBCUTANEOUS
  Administered 2018-08-21: 3 [IU] via SUBCUTANEOUS
  Administered 2018-08-21 – 2018-08-24 (×3): 1 [IU] via SUBCUTANEOUS
  Administered 2018-08-24: 2 [IU] via SUBCUTANEOUS
  Administered 2018-08-24 – 2018-08-25 (×2): 1 [IU] via SUBCUTANEOUS
  Administered 2018-08-25 – 2018-08-28 (×9): 2 [IU] via SUBCUTANEOUS
  Administered 2018-08-28: 1 [IU] via SUBCUTANEOUS
  Administered 2018-08-29 (×2): 2 [IU] via SUBCUTANEOUS
  Administered 2018-08-30: 3 [IU] via SUBCUTANEOUS
  Administered 2018-08-30 (×2): 2 [IU] via SUBCUTANEOUS
  Administered 2018-08-31: 17:00:00 5 [IU] via SUBCUTANEOUS
  Administered 2018-08-31: 3 [IU] via SUBCUTANEOUS
  Administered 2018-08-31: 14:00:00 5 [IU] via SUBCUTANEOUS
  Administered 2018-09-01: 3 [IU] via SUBCUTANEOUS
  Administered 2018-09-01: 12:00:00 2 [IU] via SUBCUTANEOUS
  Administered 2018-09-01 – 2018-09-02 (×3): 3 [IU] via SUBCUTANEOUS
  Filled 2018-08-20 (×32): qty 1

## 2018-08-20 MED ORDER — OXYCODONE-ACETAMINOPHEN 5-325 MG PO TABS
1.0000 | ORAL_TABLET | Freq: Four times a day (QID) | ORAL | Status: DC | PRN
  Administered 2018-08-21 – 2018-09-01 (×10): 1 via ORAL
  Filled 2018-08-20 (×11): qty 1

## 2018-08-20 MED ORDER — VANCOMYCIN HCL 1.5 G IV SOLR
1500.0000 mg | Freq: Once | INTRAVENOUS | Status: AC
  Administered 2018-08-20: 1500 mg via INTRAVENOUS
  Filled 2018-08-20: qty 1500

## 2018-08-20 NOTE — ED Triage Notes (Signed)
Pt arrives via [facility] from the wound center for gangrene of the right heel. Pt's doctor at the wound center advised her to be seen in the ER. Gangrene diagnosed by xray on 06/16. Pt is a resident of [place] home. Pt is non verbal and daughter is at bedside. Daughter reports she also suffers with right knee pain and she also has a sacral wound that "has not broken the skin".

## 2018-08-20 NOTE — Consult Note (Addendum)
ANTICOAGULATION CONSULT NOTE - Follow Up Consult  Pharmacy Consult for Enoxaparin  Indication: atrial fibrillation  Allergies  Allergen Reactions  . Amlodipine Cough  . Clopidogrel Bisulfate Cough  . Kenalog [Triamcinolone Acetonide] Other (See Comments)    unknown  . Lisinopril Cough  . Losartan Cough  . Sulfa Antibiotics Hives and Rash    Patient Measurements: Weight: 144 lb (65.3 kg) Heparin Dosing Weight: 65.3 kg   Vital Signs: Temp: 98.1 F (36.7 C) (06/23 1215) Temp Source: Oral (06/23 1215) BP: 132/52 (06/23 1630) Pulse Rate: 99 (06/23 1630)  Labs: Recent Labs    08/20/18 1307  HGB 10.5*  HCT 33.7*  PLT 509*  CREATININE 1.24*    Estimated Creatinine Clearance: 28.1 mL/min (A) (by C-G formula based on SCr of 1.24 mg/dL (H)).   Medications:  Xarelto last given on 08/19/18 @ 1800  Assessment: Patient is a resident a [facility] who presents with a wound infection. Xarelto is being held for possible foot surgery. CrCl < 30 mL/min  Update on 6/23 @ 1844: APTT is 42 and is appropriate given patient last dose of Xarelto was ~24 hours ago.   Goal of Therapy:  aPTT 66-102s seconds Monitor platelets by anticoagulation protocol: Yes   Plan:  Will start Enoxaparin 1 mg/kg Q24H @ 1800.  Will order baseline labs for aPTT and CBC for tomorrow morning.   Rowland Lathe 08/20/2018,5:06 PM

## 2018-08-20 NOTE — H&P (Signed)
[facility] NAME: Abigail Boyd    MR#:  [id number]  DATE OF BIRTH:  12/29/1929  DATE OF ADMISSION:  08/20/2018  PRIMARY CARE PHYSICIAN: Kirk Ruths, MD   REQUESTING/REFERRING PHYSICIAN: Ellender Hose  CHIEF COMPLAINT:   Chief Complaint  Patient presents with  . Wound Infection    HISTORY OF PRESENT ILLNESS: Abigail Boyd  is a 84 y.o. female with a known history of coronary artery disease status post stent, chronic kidney disease stage II, diabetes, hypertension, stroke-lives in a nursing facility for more than 1 year due to progressive weakness but daughter denies any dementia or memory issues.  At her baseline she is bedbound but has very good strength in her arms.  The use a lift to move her to the wheelchair to mobilize in the nursing facility.  Up until 2 months ago she was also doing some physical therapy with assistance and walker to stand up with therapist at the facility.  Patient had urinary tract infection 2 months ago and 1 of the therapist who was working with her was suspected for COVID so they put the patient on quarantine in her room and as per daughter she did not get much activities after that.  Daughter received a call in mid May saying there is a blister on her right heel.  Last week she received a call that there might be some gangrene building up in her right heel.  They have called EMS but they felt it does not seem to be a deep infection and patient was not sent to the hospital as per daughter. Today she was sent to the wound care clinic and after evaluating the suggested to go to emergency room. On x-ray it was suspected to be osteomyelitis on her right heel, so ER physician suggested to admit to hospitalist team for further management after talking to podiatry  Surgeon Patient was not very talkative due to old stroke have some speech problem, and also little bit drowsy due to pain medication use so this history was  obtained from her daughter was present in the room during my visit.  PAST MEDICAL HISTORY:   Past Medical History:  Diagnosis Date  . Abnormal nuclear stress test 06/21/2011   Inferolateral reversible defect;--> cardiac cath & PCI of CxOM, occluded RCA with collaterals;; followup Myoview 11/2012: Low risk. Fixed basal inferior artifact normal EF. No ischemia  . Anxiety   . Asthma   . Bilateral cataracts    Status post stroke or correction  . CAD S/P percutaneous coronary angioplasty 06/2011   s/P PCI to proximal OM1 w/ DES; occluded RCA with bridging and L-R collaterals (medical management)  . Chronic kidney disease (CKD), stage II (mild)    Related to current bladder infections (although diabetes cannot be excluded)  . Chronotropic incompetence with sinus node dysfunction [facility]) October 2013   On CPET test; beta blockers reduced  . Diabetes mellitus type 2, controlled ([place])    On oral medications  . Diverticulitis   . Essential hypertension    Allowing for permissive hypertension to avoid orthostatic hypotension  . GERD (gastroesophageal reflux disease)    On PPI  . Glaucoma   . History of syncope    Per EP - neurocardiogenic & not Bradycardia related (no PPM)  . History of unstable angina 06/13/2011   Jaw pain awakening from sleep -- Myoview --CATH --> PCI  . Hyperlipidemia with target LDL less than 70  HDL at goal, LDL not at goal, borderline triglycerides. On Crestor 20 mg  . Migraines   . OSA (obstructive sleep apnea)    hx bladder infections  . Osteoarthritis   . PAF (paroxysmal atrial fibrillation) ([place]) 10-11/ 2014   CardioNet Event Monitor: NSR & S Brady -- Rates 50-100; Total A. fib burden 35 hours and 27 minutes. 1296 episodes, longest was 1 hour 29 minutes. Rate ranged from 52-169 beats per minute.  . Seasonal allergies   . Shortness of breath on exertion October 2013   2-D echo: Normal EF>55%, Gr 1DD, mild aortic sclerosis; Evaluated with CPET - peak VO2 97%;  Chronotropic Incompetence (submaximal effort)  . Spinal stenosis of lumbar region 11/2011  . Stroke ([place]) 06/2017  . Stroke ([place]) 11/2014  . Tachycardia-bradycardia syndrome ([place]) 11/2012  . Urge incontinence     PAST SURGICAL HISTORY:  Past Surgical History:  Procedure Laterality Date  . APPENDECTOMY    . BREAST BIOPSY     both breast  . cataracts     both eyes  . COLONOSCOPY    . CORONARY ANGIOPLASTY WITH STENT PLACEMENT  07/05/2011   Promus Element DES 2.5 mm x 16 mm-post dilated to 2.65 mm CX-Proximal OM1  . Holter Monitor  04/2015   Sinus rhythm with rates 52-149 BPM. Isolated PACs with rare couplets and bigeminy. Multiple short runs of PAT/PSVT. Arrhythmia run 120 bpm for 204 beats. 14% of time was in A. fib/flutter. - Reviewed by Dr. Caryl Comes. Not thought to be significant enough for her syncope.  Marland Kitchen KNEE ARTHROSCOPY WITH MEDIAL MENISECTOMY Right 06/24/2014   Procedure: RIGHT KNEE ARTHROSCOPY WITH partial lateral MENISECTOMY, abrasion chondroplasty of medial femoral condyle and patella, microfracture technique;  Surgeon: Latanya Maudlin, MD;  Location: [facility];  Service: Orthopedics;  Laterality: Right;  . LEFT HEART CATHETERIZATION WITH CORONARY ANGIOGRAM N/A 07/06/2011   Procedure: LEFT HEART CATHETERIZATION WITH CORONARY ANGIOGRAM;  Surgeon: Leonie Man, MD;  Location: [facility] CATH LAB: Unstable Angina --> Myoview wiht Inf-Lat Ischemia.  Proximal OM1 lesion --> PCI; 100% mid RCA with right to right and left to right bridging collaterals from circumflex RPL and LAD the PDA.  Marland Kitchen NM MYOVIEW LTD  October 2014    Low risk. Fixed basal inferior artifact normal EF. No ischemia --> (as compared to pre-PCI Myoview revealing inferolateral ischemia)  . rotator cuff Right   . TRANSTHORACIC ECHOCARDIOGRAM  11/2014   EF 65-70%, Mod LVH. Normal wall motion. Gr 1 DD. Normal valves.  Marland Kitchen VAGINAL HYSTERECTOMY      SOCIAL HISTORY:  Social History   Tobacco Use  . Smoking status: Never Smoker  .  Smokeless tobacco: Never Used  Substance Use Topics  . Alcohol use: No    FAMILY HISTORY:  Family History  Problem Relation Age of Onset  . COPD Mother   . Pulmonary fibrosis Mother   . Heart attack Father   . Heart disease Father   . Stroke Father   . Cancer Sister   . Cancer Sister   . Clotting disorder Sister   . Heart attack Sister   . Arthritis Other   . Breast cancer Sister   . Colon cancer Neg Hx   . Esophageal cancer Neg Hx   . Stomach cancer Neg Hx     DRUG ALLERGIES:  Allergies  Allergen Reactions  . Amlodipine Cough  . Clopidogrel Bisulfate Cough  . Kenalog [Triamcinolone Acetonide] Other (See Comments)    unknown  . Lisinopril Cough  .  Losartan Cough  . Sulfa Antibiotics Hives and Rash    REVIEW OF SYSTEMS:   CONSTITUTIONAL: No fever, have fatigue or weakness.  EYES: No blurred or double vision.  EARS, NOSE, AND THROAT: No tinnitus or ear pain.  RESPIRATORY: No cough, shortness of breath, wheezing or hemoptysis.  CARDIOVASCULAR: No chest pain, orthopnea, edema.  GASTROINTESTINAL: No nausea, vomiting, diarrhea or abdominal pain.  GENITOURINARY: No dysuria, hematuria.  ENDOCRINE: No polyuria, nocturia,  HEMATOLOGY: No anemia, easy bruising or bleeding SKIN: No rash or lesion. MUSCULOSKELETAL: No joint pain or arthritis.  She have pain in right heel. NEUROLOGIC: No tingling, numbness, weakness.  PSYCHIATRY: No anxiety or depression.   MEDICATIONS AT HOME:  Prior to Admission medications   Medication Sig Start Date End Date Taking? Authorizing Provider  azelastine (ASTELIN) 0.1 % nasal spray Place 2 sprays into both nostrils at bedtime. Use in each nostril as directed    Yes [provider]  docusate sodium (STOOL SOFTENER) 100 MG capsule Take 100 mg by mouth daily.    Yes [provider]  FLUoxetine (PROZAC) 40 MG capsule Take 40 mg by mouth daily.   Yes [provider]  furosemide (LASIX) 20 MG tablet Take 20 mg by mouth  daily.  12/27/17  Yes [provider]  gabapentin (NEURONTIN) 300 MG capsule Take 300 mg by mouth 3 (three) times daily.    Yes [provider]  glucagon (GLUCAGON EMERGENCY) 1 MG injection Inject 1 mg into the vein once as needed. Give 1 mg IM for Hypoglycemia (symptomatic) not responding to oral glucose, snack. May repeat x 1 after 15 minutes. USE ONLY IF PT IS OR IS BECOMING UNRESPONSIVE   Yes [provider]  HUMALOG KWIKPEN 100 UNIT/ML KiwkPen Inject 35 Units into the skin 3 (three) times daily with meals. Give with meals for cbg > = 150 02/28/18  Yes [provider]  Insulin Glargine (TOUJEO MAX SOLOSTAR) 300 UNIT/ML SOPN Inject 55 Units into the skin at bedtime. Alternate abdominal injection sites. 02/28/18  Yes [provider]  latanoprost (XALATAN) 0.005 % ophthalmic solution Place 1 drop into both eyes at bedtime.    Yes [provider]  levETIRAcetam (KEPPRA) 500 MG tablet Take 1 tablet (500 mg total) by mouth 2 (two) times daily. 12/27/17  Yes Hosie Poisson, MD  levocetirizine (XYZAL) 5 MG tablet Take 2.5 mg by mouth daily.    Yes [provider]  Magnesium 500 MG CAPS Take 500 mg by mouth at bedtime.    Yes [provider]  metoprolol tartrate (LOPRESSOR) 25 MG tablet Take 12.5 mg by mouth daily.  01/29/18  Yes [provider]  mirtazapine (REMERON) 15 MG tablet Take 7.5 mg by mouth at bedtime.   Yes [provider]  nitroGLYCERIN (NITROSTAT) 0.4 MG SL tablet Place 0.4 mg under the tongue every 5 (five) minutes as needed for chest pain.   Yes [provider]  omeprazole (PRILOSEC) 40 MG capsule Take 40 mg by mouth daily.    Yes [provider]  oxybutynin (DITROPAN) 5 MG tablet Take 5 mg by mouth 2 (two) times daily.    Yes [provider]  oxyCODONE-acetaminophen (PERCOCET/ROXICET) 5-325 MG tablet Take 1 tablet by mouth 2 (two) times daily.   Yes [provider]   oxyCODONE-acetaminophen (PERCOCET/ROXICET) 5-325 MG tablet Take 1 tablet by mouth every 6 (six) hours as needed for moderate pain or severe pain.   Yes [provider]  polyethylene glycol (  MIRALAX / GLYCOLAX) packet Give 17 gm by mouth once every morning 03/31/18  Yes [provider]  Rivaroxaban (XARELTO) 15 MG TABS tablet Take 15 mg by mouth every evening.    Yes [provider]  rOPINIRole (REQUIP) 0.5 MG tablet TAKE 1 TABLET BY MOUTH AT  BEDTIME Patient taking differently: Take 0.5 mg by mouth at bedtime.  05/30/17  Yes Leone Haven, MD  senna (SENOKOT) 8.6 MG TABS tablet Take 2 tablets by mouth at bedtime.   Yes [provider]  silver sulfADIAZINE (SILVADENE) 1 % cream Apply 1 application topically daily as needed (wound care).   Yes [provider]  timolol (TIMOPTIC) 0.5 % ophthalmic solution Place 1 drop into both eyes at bedtime.   Yes [provider]  trolamine salicylate (ASPERCREME) 10 % cream Apply 1 application topically 3 (three) times daily. Apply to right knee and ankle for pain   Yes [provider]  mirtazapine (REMERON SOL-TAB) 15 MG disintegrating tablet Take 0.5 tablets (7.5 mg total) by mouth at bedtime. Patient not taking: Reported on 08/20/2018 12/27/17   Hosie Poisson, MD  traMADol (ULTRAM) 50 MG tablet Take 1 tablet (50 mg total) by mouth every 12 (twelve) hours. Patient not taking: Reported on 08/20/2018 05/02/18   Medina-Vargas, Monina C, NP      PHYSICAL EXAMINATION:   VITAL SIGNS: Blood pressure (!) 131/96, pulse 95, temperature 98.1 F (36.7 C), temperature source Oral, resp. rate 15, weight 65.3 kg, SpO2 96 %.  GENERAL:  85 y.o.-year-old patient lying in the bed with no acute distress.  Slightly lethargic. EYES: Pupils equal, round, reactive to light and accommodation. No scleral icterus. Extraocular muscles intact.  HEENT: Head atraumatic, normocephalic. Oropharynx and nasopharynx clear.  NECK:   Supple, no jugular venous distention. No thyroid enlargement, no tenderness.  LUNGS: Normal breath sounds bilaterally, no wheezing, rales,rhonchi or crepitation. No use of accessory muscles of respiration.  CARDIOVASCULAR: S1, S2 normal. No murmurs, rubs, or gallops.  ABDOMEN: Soft, nontender, nondistended. Bowel sounds present. No organomegaly or mass.  EXTREMITIES: No pedal edema, cyanosis, or clubbing.  Right heel has a bleeding ulcer with some surrounding redness. NEUROLOGIC: Cranial nerves II through XII are intact. Muscle strength 5/5 in all extremities. Sensation intact. Gait not checked.  PSYCHIATRIC: The patient is alert and oriented x 3.  SKIN: No obvious rash, lesion, or ulcer.   LABORATORY PANEL:   CBC Recent Labs  Lab 08/20/18 1307  WBC 19.2*  HGB 10.5*  HCT 33.7*  PLT 509*  MCV 98.0  MCH 30.5  MCHC 31.2  RDW 13.2  LYMPHSABS 1.9  MONOABS 1.3*  EOSABS 0.1  BASOSABS 0.1   ------------------------------------------------------------------------------------------------------------------  Chemistries  Recent Labs  Lab 08/20/18 1307  NA 137  K 4.0  CL 100  CO2 25  GLUCOSE 86  BUN 41*  CREATININE 1.24*  CALCIUM 9.0  AST 22  ALT 20  ALKPHOS 106  BILITOT 0.3   ------------------------------------------------------------------------------------------------------------------ estimated creatinine clearance is 28.1 mL/min (A) (by C-G formula based on SCr of 1.24 mg/dL (H)). ------------------------------------------------------------------------------------------------------------------ No results for input(s): TSH, T4TOTAL, T3FREE, THYROIDAB in the last 72 hours.  Invalid input(s): FREET3   Coagulation profile No results for input(s): INR, PROTIME in the last 168 hours. ------------------------------------------------------------------------------------------------------------------- No results for input(s): DDIMER in the last 72  hours. -------------------------------------------------------------------------------------------------------------------  Cardiac Enzymes No results for input(s): CKMB, TROPONINI, MYOGLOBIN in the last 168 hours.  Invalid input(s): CK ------------------------------------------------------------------------------------------------------------------ Invalid input(s): POCBNP  ---------------------------------------------------------------------------------------------------------------  Urinalysis  Component Value Date/Time   COLORURINE YELLOW 12/22/2017 1530   APPEARANCEUR CLOUDY (A) 12/22/2017 1530   APPEARANCEUR Cloudy (A) 06/04/2017 1507   LABSPEC 1.011 12/22/2017 1530   PHURINE 9.0 (H) 12/22/2017 1530   GLUCOSEU NEGATIVE 12/22/2017 1530   GLUCOSEU NEGATIVE 08/19/2015 1630   HGBUR NEGATIVE 12/22/2017 [place] 12/22/2017 1530   BILIRUBINUR Negative 06/04/2017 [place] 12/22/2017 1530   PROTEINUR NEGATIVE 12/22/2017 1530   UROBILINOGEN 0.2 04/12/2017 1355   UROBILINOGEN 0.2 08/19/2015 1630   NITRITE NEGATIVE 12/22/2017 1530   LEUKOCYTESUR NEGATIVE 12/22/2017 1530   LEUKOCYTESUR 3+ (A) 06/04/2017 1507     RADIOLOGY: Dg Chest Portable 1 View  Result Date: 08/20/2018 CLINICAL DATA:  Shortness of breath. EXAM: PORTABLE CHEST 1 VIEW COMPARISON:  Radiograph of December 24, 2017. FINDINGS: The heart size and mediastinal contours are within normal limits. Both lungs are clear. No pneumothorax or pleural effusion is noted. The visualized skeletal structures are unremarkable. IMPRESSION: No active disease. Electronically Signed   By: Marijo Conception M.D.   On: 08/20/2018 14:52   Dg Foot Complete Right  Result Date: 08/20/2018 CLINICAL DATA:  Pain right posterior heel. Open wound. History of gangrene. History of diabetes. EXAM: RIGHT FOOT COMPLETE - 3+ VIEW COMPARISON:  07/21/2017. FINDINGS: Overlying bandage is present making evaluation difficult.  Diffuse osteopenia degenerative change. Soft tissue ulceration noted over the calcaneus. Underlying subtle calcaneal erosions may be present. Osteomyelitis cannot be excluded. MRI can be obtained to further evaluate as needed. IMPRESSION: Soft tissue ulceration noted over the calcaneus. Underlying subtle calcaneal erosions may be present. Osteomyelitis cannot be excluded. MRI can be obtained for further evaluation AS needed. Electronically Signed   By: Marcello Moores  Register   On: 08/20/2018 13:58    EKG: Orders placed or performed during the hospital encounter of 12/21/17  . ED EKG  . ED EKG  . EKG 12-Lead  . EKG 12-Lead  . EKG 12-Lead  . EKG 12-Lead  . EKG    IMPRESSION AND PLAN:  *Diabetic ulcer on right heel Possible osteomyelitis  Broad-spectrum antibiotics for now.  X-ray does not give a clear diagnosis of osteomyelitis so will order MRI. Podiatry consult. Pain management as needed.  *Diabetes mellitus with CKD stage III We will continue basal insulin and keep on sliding scale coverage. Avoid nephrotoxic medication and monitor renal function closely.  *Paroxysmal atrial fibrillation She is on beta-blocker and Xarelto. Continue rate control but hold Xarelto in anticipation of procedure by podiatry. She is taking dose every evening so likely she might have taken last dose last night.  Pharmacy to confirm the last dose. We will give her Lovenox therapeutic dose to cover while we are holding Xarelto.  *Coronary artery disease status post stent Continue cardiac meds.  *Essential hypertension Continue home medications, controlled well.  *History of stroke Long-term resident at nursing facility Patient is bedbound at baseline.   All the records are reviewed and case discussed with ED provider. Management plans discussed with the patient, family and they are in agreement.  CODE STATUS: DNR Code Status History    Date Active Date Inactive Code Status Order ID Comments User  Context   12/22/2017 0108 12/27/2017 1631 DNR [id number]  Elwyn Reach, MD Inpatient   07/22/2017 0002 07/25/2017 1915 DNR [id number]  Vianne Bulls, MD ED   07/05/2017 2005 07/09/2017 2224 DNR [id number]  Saundra Shelling, MD Inpatient   07/05/2017 1818 07/05/2017 2004 DNR [id number]  Saundra Shelling,  MD ED   04/23/2017 2216 04/25/2017 2153 DNR [id number]  [name], MD Inpatient   12/26/2014 1822 12/29/2014 1632 Full Code [id number]  Allie Bossier, MD ED   12/19/2012 2229 12/20/2012 2124 Full Code [id number]  Consuelo Pandy, PA-C Inpatient   Advance Care Planning Activity    Questions for Most Recent Historical Code Status (Order [id number])    Question Answer Comment   In the event of cardiac or respiratory ARREST Do not call a "code blue"    In the event of cardiac or respiratory ARREST Do not perform Intubation, CPR, defibrillation or ACLS    In the event of cardiac or respiratory ARREST Use medication by any route, position, wound care, and other measures to relive pain and suffering. May use oxygen, suction and manual treatment of airway obstruction as needed for comfort.         Advance Directive Documentation     Most Recent Value  Type of Advance Directive  Out of facility DNR (pink MOST or yellow form) [pt does not have yellow form in her posession]  Pre-existing out of facility DNR order (yellow form or pink MOST form)  -  "MOST" Form in Place?  -       TOTAL TIME TAKING CARE OF THIS PATIENT: 50 minutes.  Discussed with patient's daughter was present in the room during my visit.  [name] M.D on 08/20/2018   Between 7am to 6pm - Pager - [phone]  After 6pm go to [email or web address] - password [email or web address] [facility]  Office  [phone]  CC: Primary care physician; Kirk Ruths, MD   Note: This dictation was prepared with Dragon dictation along with smaller phrase technology. Any transcriptional errors that result from this  process are unintentional.

## 2018-08-20 NOTE — ED Notes (Signed)
ED TO INPATIENT HANDOFF REPORT  ED Nurse Name and Phone #:  (don't know who will have this patient on night shift)  S Name/Age/Gender Abigail Boyd 85 y.o. female Room/Bed: ED37A/ED37A  Code Status   Code Status: Prior  Home/SNF/Other From [facility] Patient oriented to: moans when moved. not oriented. Daughter states that pt usually talks to her on the phone.  Is this baseline? no  Triage Complete: Triage complete  Chief Complaint infection  Triage Note Pt arrives via [facility] from the wound center for gangrene of the right heel. Pt's doctor at the wound center advised her to be seen in the ER. Gangrene diagnosed by xray on 06/16. Pt is a resident of [place] home. Pt is non verbal and daughter is at bedside. Daughter reports she also suffers with right knee pain and she also has a sacral wound that "has not broken the skin".  Pt sent from the wound center due to an X-ray showing  + gangrene to her right heel and a sacral pressure ulcer   Allergies Allergies  Allergen Reactions  . Amlodipine Cough  . Clopidogrel Bisulfate Cough  . Kenalog [Triamcinolone Acetonide] Other (See Comments)    unknown  . Lisinopril Cough  . Losartan Cough  . Sulfa Antibiotics Hives and Rash    Level of Care/Admitting Diagnosis ED Disposition    ED Disposition Condition [facility] Area: [place] [[id number]]  Level of Care: Med-Surg [16]  Covid Evaluation: Confirmed COVID Negative  Diagnosis: Osteomyelitis [facility]) [[id number]]  Admitting Physician: [name] [phone]  Attending Physician: [name] [phone]  Estimated length of stay: past midnight tomorrow  Certification:: I certify this patient will need inpatient services for at least 2 midnights  PT Class (Do Not Modify): Inpatient [101]  PT Acc Code (Do Not Modify): Private [1]       B Medical/Surgery History Past Medical History:  Diagnosis Date  .  Abnormal nuclear stress test 06/21/2011   Inferolateral reversible defect;--> cardiac cath & PCI of CxOM, occluded RCA with collaterals;; followup Myoview 11/2012: Low risk. Fixed basal inferior artifact normal EF. No ischemia  . Anxiety   . Asthma   . Bilateral cataracts    Status post stroke or correction  . CAD S/P percutaneous coronary angioplasty 06/2011   s/P PCI to proximal OM1 w/ DES; occluded RCA with bridging and L-R collaterals (medical management)  . Chronic kidney disease (CKD), stage II (mild)    Related to current bladder infections (although diabetes cannot be excluded)  . Chronotropic incompetence with sinus node dysfunction [facility]) October 2013   On CPET test; beta blockers reduced  . Diabetes mellitus type 2, controlled ([place])    On oral medications  . Diverticulitis   . Essential hypertension    Allowing for permissive hypertension to avoid orthostatic hypotension  . GERD (gastroesophageal reflux disease)    On PPI  . Glaucoma   . History of syncope    Per EP - neurocardiogenic & not Bradycardia related (no PPM)  . History of unstable angina 06/13/2011   Jaw pain awakening from sleep -- Myoview --CATH --> PCI  . Hyperlipidemia with target LDL less than 70    HDL at goal, LDL not at goal, borderline triglycerides. On Crestor 20 mg  . Migraines   . OSA (obstructive sleep apnea)    hx bladder infections  . Osteoarthritis   . PAF (paroxysmal atrial fibrillation) ([place]) 10-11/ 2014   CardioNet  Event Monitor: NSR & S Brady -- Rates 50-100; Total A. fib burden 35 hours and 27 minutes. 1296 episodes, longest was 1 hour 29 minutes. Rate ranged from 52-169 beats per minute.  . Seasonal allergies   . Shortness of breath on exertion October 2013   2-D echo: Normal EF>55%, Gr 1DD, mild aortic sclerosis; Evaluated with CPET - peak VO2 97%; Chronotropic Incompetence (submaximal effort)  . Spinal stenosis of lumbar region 11/2011  . Stroke ([place]) 06/2017  . Stroke ([place]) 11/2014   . Tachycardia-bradycardia syndrome ([place]) 11/2012  . Urge incontinence    Past Surgical History:  Procedure Laterality Date  . APPENDECTOMY    . BREAST BIOPSY     both breast  . cataracts     both eyes  . COLONOSCOPY    . CORONARY ANGIOPLASTY WITH STENT PLACEMENT  07/05/2011   Promus Element DES 2.5 mm x 16 mm-post dilated to 2.65 mm CX-Proximal OM1  . Holter Monitor  04/2015   Sinus rhythm with rates 52-149 BPM. Isolated PACs with rare couplets and bigeminy. Multiple short runs of PAT/PSVT. Arrhythmia run 120 bpm for 204 beats. 14% of time was in A. fib/flutter. - Reviewed by Dr. Caryl Comes. Not thought to be significant enough for her syncope.  Marland Kitchen KNEE ARTHROSCOPY WITH MEDIAL MENISECTOMY Right 06/24/2014   Procedure: RIGHT KNEE ARTHROSCOPY WITH partial lateral MENISECTOMY, abrasion chondroplasty of medial femoral condyle and patella, microfracture technique;  Surgeon: Latanya Maudlin, MD;  Location: [facility];  Service: Orthopedics;  Laterality: Right;  . LEFT HEART CATHETERIZATION WITH CORONARY ANGIOGRAM N/A 07/06/2011   Procedure: LEFT HEART CATHETERIZATION WITH CORONARY ANGIOGRAM;  Surgeon: Leonie Man, MD;  Location: [facility] CATH LAB: Unstable Angina --> Myoview wiht Inf-Lat Ischemia.  Proximal OM1 lesion --> PCI; 100% mid RCA with right to right and left to right bridging collaterals from circumflex RPL and LAD the PDA.  Marland Kitchen NM MYOVIEW LTD  October 2014    Low risk. Fixed basal inferior artifact normal EF. No ischemia --> (as compared to pre-PCI Myoview revealing inferolateral ischemia)  . rotator cuff Right   . TRANSTHORACIC ECHOCARDIOGRAM  11/2014   EF 65-70%, Mod LVH. Normal wall motion. Gr 1 DD. Normal valves.  Marland Kitchen VAGINAL HYSTERECTOMY       A IV Location/Drains/Wounds Patient Lines/Drains/Airways Status   Active Line/Drains/Airways    Name:   Placement date:   Placement time:   Site:   Days:   Peripheral IV 08/20/18 Left Antecubital   08/20/18    1223    Antecubital   less than 1    Peripheral IV 08/20/18 Right Antecubital   08/20/18    1319    Antecubital   less than 1          Intake/Output Last 24 hours No intake or output data in the 24 hours ending 08/20/18 1829  Labs/Imaging Results for orders placed or performed during the hospital encounter of 08/20/18 (from the past 48 hour(s))  Lactic acid, plasma     Status: Abnormal   Collection Time: 08/20/18  1:07 PM  Result Value Ref Range   Lactic Acid, Venous 3.0 (HH) 0.5 - 1.9 mmol/L    Comment: CRITICAL RESULT CALLED TO, READ BACK BY AND VERIFIED WITH JESSICA FULCHER RN AT 1400 ON 08/20/2018 [facility] Performed at [facility], [address] [place], [place] [address]   Comprehensive metabolic panel     Status: Abnormal   Collection Time: 08/20/18  1:07 PM  Result Value Ref Range  Sodium 137 135 - 145 mmol/L   Potassium 4.0 3.5 - 5.1 mmol/L   Chloride 100 98 - 111 mmol/L   CO2 25 22 - 32 mmol/L   Glucose, Bld 86 70 - 99 mg/dL   BUN 41 (H) 8 - 23 mg/dL   Creatinine, Ser 1.24 (H) 0.44 - 1.00 mg/dL   Calcium 9.0 8.9 - 10.3 mg/dL   Total Protein 7.6 6.5 - 8.1 g/dL   Albumin 2.8 (L) 3.5 - 5.0 g/dL   AST 22 15 - 41 U/L   ALT 20 0 - 44 U/L   Alkaline Phosphatase 106 38 - 126 U/L   Total Bilirubin 0.3 0.3 - 1.2 mg/dL   GFR calc non Af Amer 39 (L) >60 mL/min   GFR calc Af Amer 46 (L) >60 mL/min   Anion gap 12 5 - 15    Comment: Performed at [facility], [address] [place], [place] [address]  CBC with Differential     Status: Abnormal   Collection Time: 08/20/18  1:07 PM  Result Value Ref Range   WBC 19.2 (H) 4.0 - 10.5 K/uL   RBC 3.44 (L) 3.87 - 5.11 MIL/uL   Hemoglobin 10.5 (L) 12.0 - 15.0 g/dL   HCT 33.7 (L) 36.0 - 46.0 %   MCV 98.0 80.0 - 100.0 fL   MCH 30.5 26.0 - 34.0 pg   MCHC 31.2 30.0 - 36.0 g/dL   RDW 13.2 11.5 - 15.5 %   Platelets 509 (H) 150 - 400 K/uL   nRBC 0.0 0.0 - 0.2 %   Neutrophils Relative % 82 %   Neutro Abs 15.6 (H) 1.7 - 7.7 K/uL   Lymphocytes  Relative 10 %   Lymphs Abs 1.9 0.7 - 4.0 K/uL   Monocytes Relative 7 %   Monocytes Absolute 1.3 (H) 0.1 - 1.0 K/uL   Eosinophils Relative 0 %   Eosinophils Absolute 0.1 0.0 - 0.5 K/uL   Basophils Relative 0 %   Basophils Absolute 0.1 0.0 - 0.1 K/uL   Immature Granulocytes 1 %   Abs Immature Granulocytes 0.23 (H) 0.00 - 0.07 K/uL    Comment: Performed at [facility], [address] [place], [place] [address]  Sedimentation rate     Status: Abnormal   Collection Time: 08/20/18  1:07 PM  Result Value Ref Range   Sed Rate 125 (H) 0 - 30 mm/hr    Comment: Performed at [facility], [address], [place],  [address]  SARS Coronavirus 2 (CEPHEID - Performed in [place] hospital lab), Hosp Order     Status: None   Collection Time: 08/20/18  2:43 PM   Specimen: Nasopharyngeal Swab  Result Value Ref Range   SARS Coronavirus 2 NEGATIVE NEGATIVE    Comment: (NOTE) If result is NEGATIVE SARS-CoV-2 target nucleic acids are NOT DETECTED. The SARS-CoV-2 RNA is generally detectable in upper and lower  respiratory specimens during the acute phase of infection. The lowest  concentration of SARS-CoV-2 viral copies this assay can detect is 250  copies / mL. A negative result does not preclude SARS-CoV-2 infection  and should not be used as the sole basis for treatment or other  patient management decisions.  A negative result may occur with  improper specimen collection / handling, submission of specimen other  than nasopharyngeal swab, presence of viral mutation(s) within the  areas targeted by this assay, and inadequate number of viral copies  (<250 copies / mL). A  negative result must be combined with clinical  observations, patient history, and epidemiological information. If result is POSITIVE SARS-CoV-2 target nucleic acids are DETECTED. The SARS-CoV-2 RNA is generally detectable in upper and lower  respiratory specimens dur ing the acute phase of infection.   Positive  results are indicative of active infection with SARS-CoV-2.  Clinical  correlation with patient history and other diagnostic information is  necessary to determine patient infection status.  Positive results do  not rule out bacterial infection or co-infection with other viruses. If result is PRESUMPTIVE POSTIVE SARS-CoV-2 nucleic acids MAY BE PRESENT.   A presumptive positive result was obtained on the submitted specimen  and confirmed on repeat testing.  While 2019 novel coronavirus  (SARS-CoV-2) nucleic acids may be present in the submitted sample  additional confirmatory testing may be necessary for epidemiological  and / or clinical management purposes  to differentiate between  SARS-CoV-2 and other Sarbecovirus currently known to infect humans.  If clinically indicated additional testing with an alternate test  methodology [phone]) is advised. The SARS-CoV-2 RNA is generally  detectable in upper and lower respiratory sp ecimens during the acute  phase of infection. The expected result is Negative. Fact Sheet for Patients:  StrictlyIdeas.no Fact Sheet for Healthcare Providers: BankingDealers.co.za This test is not yet approved or cleared by the [place] FDA and has been authorized for detection and/or diagnosis of SARS-CoV-2 by FDA under an Emergency Use Authorization (EUA).  This EUA will remain in effect (meaning this test can be used) for the duration of the COVID-19 declaration under Section 564(b)(1) of the Act, 21 U.S.C. section 360bbb-3(b)(1), unless the authorization is terminated or revoked sooner. Performed at [facility], [address] [place], [place] [address]   Glucose, capillary     Status: Abnormal   Collection Time: 08/20/18  3:00 PM  Result Value Ref Range   Glucose-Capillary 117 (H) 70 - 99 mg/dL  Lactic acid, plasma     Status: None   Collection Time: 08/20/18  3:47 PM  Result  Value Ref Range   Lactic Acid, Venous 1.5 0.5 - 1.9 mmol/L    Comment: Performed at [facility], [address] [place], [place] [address]  Glucose, capillary     Status: Abnormal   Collection Time: 08/20/18  5:51 PM  Result Value Ref Range   Glucose-Capillary 163 (H) 70 - 99 mg/dL  APTT     Status: Abnormal   Collection Time: 08/20/18  6:03 PM  Result Value Ref Range   aPTT 42 (H) 24 - 36 seconds    Comment:        IF BASELINE aPTT IS ELEVATED, SUGGEST PATIENT RISK ASSESSMENT BE USED TO DETERMINE APPROPRIATE ANTICOAGULANT THERAPY. Performed at [facility], [address], [place], [place] [address]    Dg Chest Portable 1 View  Result Date: 08/20/2018 CLINICAL DATA:  Shortness of breath. EXAM: PORTABLE CHEST 1 VIEW COMPARISON:  Radiograph of December 24, 2017. FINDINGS: The heart size and mediastinal contours are within normal limits. Both lungs are clear. No pneumothorax or pleural effusion is noted. The visualized skeletal structures are unremarkable. IMPRESSION: No active disease. Electronically Signed   By: Marijo Conception M.D.   On: 08/20/2018 14:52   Dg Foot Complete Right  Result Date: 08/20/2018 CLINICAL DATA:  Pain right posterior heel. Open wound. History of gangrene. History of diabetes. EXAM: RIGHT FOOT COMPLETE - 3+ VIEW COMPARISON:  07/21/2017. FINDINGS: Overlying bandage is present making evaluation  difficult. Diffuse osteopenia degenerative change. Soft tissue ulceration noted over the calcaneus. Underlying subtle calcaneal erosions may be present. Osteomyelitis cannot be excluded. MRI can be obtained to further evaluate as needed. IMPRESSION: Soft tissue ulceration noted over the calcaneus. Underlying subtle calcaneal erosions may be present. Osteomyelitis cannot be excluded. MRI can be obtained for further evaluation AS needed. Electronically Signed   By: Marcello Moores  Register   On: 08/20/2018 13:58    Pending Labs Unresulted Labs (From admission,  onward)    Start     Ordered   08/21/18 1700  Vancomycin, random  Once-Timed,   STAT     08/20/18 1419   08/21/18 0500  Creatinine, serum  Tomorrow morning,   STAT     08/20/18 1419   08/21/18 0500  CBC  Tomorrow morning,   STAT     08/20/18 1711   08/20/18 1259  Blood culture (routine x 2)  BLOOD CULTURE X 2,   STAT     08/20/18 1258   08/20/18 1258  C-reactive protein  Once,   STAT     08/20/18 1258   Signed and Held  Basic metabolic panel  Tomorrow morning,   R     Signed and Held   Signed and Held  CBC  Tomorrow morning,   R     Signed and Held          Vitals/Pain Today's Vitals   08/20/18 1615 08/20/18 1630 08/20/18 1700 08/20/18 1730  BP:  (!) 132/52 (!) 131/96 (!) 142/58  Pulse:  99 95 90  Resp: 16 (!) 21 15 14   Temp:      TempSrc:      SpO2:  100% 96% 97%  Weight:        Isolation Precautions No active isolations  Medications Medications  0.9 %  sodium chloride infusion (has no administration in time range)  vancomycin variable dose per unstable renal function (pharmacist dosing) (has no administration in time range)  ceFEPIme (MAXIPIME) 1 g in sodium chloride 0.9 % 100 mL IVPB (has no administration in time range)  insulin aspart (novoLOG) injection 0-9 Units (2 Units Subcutaneous Given 08/20/18 1800)  insulin aspart (novoLOG) injection 0-5 Units (has no administration in time range)  morphine 2 MG/ML injection 1 mg (has no administration in time range)  enoxaparin (LOVENOX) injection 65 mg (has no administration in time range)  vancomycin (VANCOCIN) 1,500 mg in sodium chloride 0.9 % 500 mL IVPB (0 mg Intravenous Stopped 08/20/18 1647)  ceFEPIme (MAXIPIME) 2 g in sodium chloride 0.9 % 100 mL IVPB (0 g Intravenous Stopped 08/20/18 1454)  sodium chloride 0.9 % bolus 1,000 mL (0 mLs Intravenous Stopped 08/20/18 1640)  sodium chloride 0.9 % bolus 1,000 mL (0 mLs Intravenous Stopped 08/20/18 1640)    Mobility non-ambulatory- bed bound  High fall risk   Focused  Assessments   R Recommendations: See Admitting Provider Note  Report given to:   Additional Notes:   Has a pad on bottom from skin break down Daughter with patient d/t pt being non-verbal  Has boot for R foot at bedside with pt

## 2018-08-20 NOTE — ED Notes (Signed)
Admitting MD at bedside.

## 2018-08-20 NOTE — ED Triage Notes (Signed)
Pt sent from the wound center due to an X-ray showing  + gangrene to her right heel and a sacral pressure ulcer

## 2018-08-20 NOTE — Consult Note (Addendum)
Pharmacy Antibiotic Note  Abigail Boyd is a 85 y.o. female admitted on 08/20/2018 with Osteomyelitis.  Pharmacy has been consulted for Vancomycin and Cefepime dosing.  I called the nurse for the patient's weight and was told the patient weighed ~ 144 lbs.   Presently, there is no serum creatinine available. Update: Given renal function will likely need to base dose on levels.  Plan: 1- Will order Cefepime 2 g x1 dose. Update: Will order Cefepime 1g Q24H to start 6/24 @ 1400  2- Will order a loading dose of Vancomycin 1500 mg x1 dose. Will order a 24-Hr VR and serum creatinine for tomorrow.  Weight: 144 lb (65.3 kg)  Temp (24hrs), Avg:98.1 F (36.7 C), Min:98.1 F (36.7 C), Max:98.1 F (36.7 C)  No results for input(s): WBC, CREATININE, LATICACIDVEN, VANCOTROUGH, VANCOPEAK, VANCORANDOM, GENTTROUGH, GENTPEAK, GENTRANDOM, TOBRATROUGH, TOBRAPEAK, TOBRARND, AMIKACINPEAK, AMIKACINTROU, AMIKACIN in the last 168 hours.  CrCl cannot be calculated (Patient's most recent lab result is older than the maximum 21 days allowed.).    Allergies  Allergen Reactions  . Amlodipine Cough  . Clopidogrel Bisulfate Cough  . Kenalog [Triamcinolone Acetonide] Other (See Comments)    unknown  . Lisinopril Cough  . Losartan Cough  . Sulfa Antibiotics Hives and Rash    Antimicrobials this admission: 6/23 Cefepime >> 6/23 Vancomycin >>   Dose adjustments this admission: N/A  Microbiology results: 6/23 BCx: pending   Thank you for allowing pharmacy to be a part of this patient's care.  Rowland Lathe 08/20/2018 1:04 PM

## 2018-08-20 NOTE — ED Notes (Signed)
Attempted report. Staff reported they have not had a chance to look over chart to take report and need more time

## 2018-08-20 NOTE — ED Provider Notes (Addendum)
[facility] Emergency Department Provider Note  ____________________________________________   First MD Initiated Contact with Patient 08/20/18 1221     (approximate)  I have reviewed the triage vital signs and the nursing notes.   HISTORY  Chief Complaint Wound Infection    HPI Abigail Boyd is a 85 y.o. female with extensive past medical history as below including CKD, hypertension, hyperlipidemia, history of stroke, here with wound to the right heel.  History provided by the patient's daughter.  Per report, the patient has had a small wound to her ankle for the last several weeks.  She was reportedly told that over the last 2 weeks, it has worsened.  She has been unable to see it because of restrictions due to coronavirus, but she was told by the facility that it was not infected.  However, the patient reportedly was started on antibiotics a week ago, and an x-ray was also performed recently.  The patient's daughter states that she tried to call EMS on Friday, and was told by EMS that it did not look infected and that she did not need further treatment.  Over the last several days, the patient has been increasingly confused and less responsive.  Daughter subsequently had him call 911 again, and she is brought to the ED.  Patient is minimally responsive on my assessment, but does wince in severe pain with any movement of the right leg.  She has a history of chronic knee pain in that knee as well.  Daughter also notes increased wound to the sacrum.    Past Medical History:  Diagnosis Date  . Abnormal nuclear stress test 06/21/2011   Inferolateral reversible defect;--> cardiac cath & PCI of CxOM, occluded RCA with collaterals;; followup Myoview 11/2012: Low risk. Fixed basal inferior artifact normal EF. No ischemia  . Anxiety   . Asthma   . Bilateral cataracts    Status post stroke or correction  . CAD S/P percutaneous coronary angioplasty 06/2011   s/P PCI to  proximal OM1 w/ DES; occluded RCA with bridging and L-R collaterals (medical management)  . Chronic kidney disease (CKD), stage II (mild)    Related to current bladder infections (although diabetes cannot be excluded)  . Chronotropic incompetence with sinus node dysfunction [facility]) October 2013   On CPET test; beta blockers reduced  . Diabetes mellitus type 2, controlled ([place])    On oral medications  . Diverticulitis   . Essential hypertension    Allowing for permissive hypertension to avoid orthostatic hypotension  . GERD (gastroesophageal reflux disease)    On PPI  . Glaucoma   . History of syncope    Per EP - neurocardiogenic & not Bradycardia related (no PPM)  . History of unstable angina 06/13/2011   Jaw pain awakening from sleep -- Myoview --CATH --> PCI  . Hyperlipidemia with target LDL less than 70    HDL at goal, LDL not at goal, borderline triglycerides. On Crestor 20 mg  . Migraines   . OSA (obstructive sleep apnea)    hx bladder infections  . Osteoarthritis   . PAF (paroxysmal atrial fibrillation) ([place]) 10-11/ 2014   CardioNet Event Monitor: NSR & S Brady -- Rates 50-100; Total A. fib burden 35 hours and 27 minutes. 1296 episodes, longest was 1 hour 29 minutes. Rate ranged from 52-169 beats per minute.  . Seasonal allergies   . Shortness of breath on exertion October 2013   2-D echo: Normal EF>55%, Gr 1DD, mild aortic  sclerosis; Evaluated with CPET - peak VO2 97%; Chronotropic Incompetence (submaximal effort)  . Spinal stenosis of lumbar region 11/2011  . Stroke ([place]) 06/2017  . Stroke ([place]) 11/2014  . Tachycardia-bradycardia syndrome ([place]) 11/2012  . Urge incontinence     Patient Active Problem List   Diagnosis Date Noted  . Debility 02/22/2018  . Palliative care encounter 02/22/2018  . Memory loss 02/22/2018  . Somnolence 01/19/2018  . Palliative care by specialist   . Altered mental status   . Dysphagia   . Seizure disorder ([place]) 12/21/2017  . Chronic  bilateral low back pain with right-sided sciatica 10/15/2017  . Hypertensive heart disease with congestive heart failure and stage 2 kidney disease ([place]) 09/06/2017  . Diabetes mellitus type 2 with neurological manifestations ([place]) 09/06/2017  . Diabetic neuropathy ([place]) 09/06/2017  . Chronic glaucoma 09/06/2017  . Acute CVA (cerebrovascular accident) ([place]) 07/23/2017  . Chronic diastolic CHF (congestive heart failure) ([place]) 07/21/2017  . Chronic cerebrovascular accident (CVA) 06/27/2017  . Generalized weakness 04/23/2017  . Recurrent UTI 04/23/2017  . Anxiety 12/13/2015  . Allergic rhinitis 12/13/2015  . Urge incontinence 03/15/2015  . Constipation 03/15/2015  . Right knee pain 03/15/2015  . CKD stage 2 due to type 2 diabetes mellitus ([place])   . Goals of care, counseling/discussion 06/10/2014  . PLMD (periodic limb movement disorder) 06/12/2013  . Obstructive sleep apnea 03/05/2013  . Tachy-brady syndrome: Rates range from 50-169 bpm in A. fib 12/20/2012  . History of syncope: Unclear etiology 12/19/2012  . PAF (paroxysmal atrial fibrillation) (HCC);  CHA2DS2-VASc Score =6 on 15 mg Xarelto 05/16/2012    Class: Diagnosis of  . Long term current use of anticoagulant therapy 05/16/2012  . CAD-PCI OM1, 100% RCA (L-R colllaterals) 07/07/2011  . Essential hypertension     Class: Diagnosis of  . Dyslipidemia associated with type 2 diabetes mellitus (HCC)     Class: Diagnosis of  . Diverticulitis 03/21/2011  . GERD (gastroesophageal reflux disease) 03/21/2011  . Goiter 03/21/2011  . History of carpal tunnel syndrome 03/21/2011  . Osteopenia 03/21/2011  . Vitamin D deficiency disease 03/21/2011    Past Surgical History:  Procedure Laterality Date  . APPENDECTOMY    . BREAST BIOPSY     both breast  . cataracts     both eyes  . COLONOSCOPY    . CORONARY ANGIOPLASTY WITH STENT PLACEMENT  07/05/2011   Promus Element DES 2.5 mm x 16 mm-post dilated to 2.65 mm CX-Proximal OM1  . Holter  Monitor  04/2015   Sinus rhythm with rates 52-149 BPM. Isolated PACs with rare couplets and bigeminy. Multiple short runs of PAT/PSVT. Arrhythmia run 120 bpm for 204 beats. 14% of time was in A. fib/flutter. - Reviewed by Dr. Caryl Comes. Not thought to be significant enough for her syncope.  Marland Kitchen KNEE ARTHROSCOPY WITH MEDIAL MENISECTOMY Right 06/24/2014   Procedure: RIGHT KNEE ARTHROSCOPY WITH partial lateral MENISECTOMY, abrasion chondroplasty of medial femoral condyle and patella, microfracture technique;  Surgeon: Latanya Maudlin, MD;  Location: [facility] ORS;  Service: Orthopedics;  Laterality: Right;  . LEFT HEART CATHETERIZATION WITH CORONARY ANGIOGRAM N/A 07/06/2011   Procedure: LEFT HEART CATHETERIZATION WITH CORONARY ANGIOGRAM;  Surgeon: Leonie Man, MD;  Location: [facility] CATH LAB: Unstable Angina --> Myoview wiht Inf-Lat Ischemia.  Proximal OM1 lesion --> PCI; 100% mid RCA with right to right and left to right bridging collaterals from circumflex RPL and LAD the PDA.  Marland Kitchen NM MYOVIEW LTD  October 2014    Low  risk. Fixed basal inferior artifact normal EF. No ischemia --> (as compared to pre-PCI Myoview revealing inferolateral ischemia)  . rotator cuff Right   . TRANSTHORACIC ECHOCARDIOGRAM  11/2014   EF 65-70%, Mod LVH. Normal wall motion. Gr 1 DD. Normal valves.  Marland Kitchen VAGINAL HYSTERECTOMY      Prior to Admission medications   Medication Sig Start Date End Date Taking? Authorizing Provider  acetaminophen (TYLENOL) 650 MG CR tablet Take 650 mg by mouth every 6 (six) hours as needed for pain.    [provider]  azelastine (ASTELIN) 0.1 % nasal spray Place 2 sprays into both nostrils at bedtime. Use in each nostril as directed     [provider]  bisacodyl (DULCOLAX) 10 MG suppository Place 10 mg rectally daily as needed for moderate constipation. 01/25/18   [provider]  carteolol (OCUPRESS) 1 % ophthalmic solution Place 1 drop into both eyes 2 (two) times daily.    [provider]  docusate sodium (STOOL SOFTENER) 100 MG capsule Take 100 mg by mouth daily.     [provider]  FLUoxetine (PROZAC) 40 MG capsule Take 40 mg by mouth daily.    [provider]  furosemide (LASIX) 20 MG tablet Take 20 mg by mouth daily.  12/27/17   [provider]  gabapentin (NEURONTIN) 300 MG capsule Take 300 mg by mouth 3 (three) times daily.     [provider]  glucagon (GLUCAGON EMERGENCY) 1 MG injection Inject 1 mg into the vein once as needed. Give 1 mg IM for Hypoglycemia (symptomatic) not responding to oral glucose, snack. May repeat x 1 after 15 minutes. USE ONLY IF PT IS OR IS BECOMING UNRESPONSIVE    [provider]  GLUCERNA (GLUCERNA) LIQD Take 237 mLs by mouth 2 (two) times daily between meals. 01/02/18   [provider]  HUMALOG KWIKPEN 100 UNIT/ML KiwkPen Inject 35 Units into the skin 3 (three) times daily with meals. Give with meals for cbg > = 150 02/28/18   [provider]  hydrocortisone cream 1 % Apply thin film topically to clean, dry hemorrhoids four times daily as needed 03/31/18   [provider]  [place] EX) Apply liberal amount topically to area of skin irritation prn. OK to leave at bedside.    [provider]  Insulin Glargine (TOUJEO MAX SOLOSTAR) 300 UNIT/ML SOPN Inject 55 Units into the skin at bedtime. Alternate abdominal injection sites. 02/28/18   [provider]  latanoprost (XALATAN) 0.005 % ophthalmic solution Place 1 drop into both eyes daily.     [provider]  levETIRAcetam (KEPPRA) 500 MG tablet Take 1 tablet (500 mg total) by mouth 2 (two) times daily. 12/27/17   Hosie Poisson, MD  levocetirizine (XYZAL) 5 MG tablet Take 2.5 mg by mouth daily. Take 0.5 tablet to = 2.5 mg    [provider]  Lidocaine-Menthol (ICY HOT LIDOCAINE PLUS MENTHOL) 4-1 % PTCH Apply 1 patch topically daily. apply to lower back in the a.m. for  pain management    [provider]  Magnesium 500 MG CAPS Take 500 mg by mouth at bedtime.     [provider]  metoprolol tartrate (LOPRESSOR) 25 MG tablet Take 12.5 mg by mouth daily. Take 0.5 tablet to = 12.5 mg 01/29/18   [provider]  mirtazapine (REMERON SOL-TAB) 15 MG disintegrating tablet Take 0.5 tablets (7.5 mg total) by mouth at bedtime. 12/27/17   Hosie Poisson,  MD  nitroGLYCERIN (NITROSTAT) 0.4 MG SL tablet Place 0.4 mg under the tongue every 5 (five) minutes as needed for chest pain.    [provider]  NON FORMULARY Diet Order: Upgrade patient to dysphagia 2 consistencies (ground), continue thin liquids; Straws ok 01/10/18   [provider]  Nutritional Supplements (NUTRITIONAL SUPPLEMENT PO) Snack at bedtime    [provider]  omeprazole (PRILOSEC) 40 MG capsule Take 40 mg by mouth daily.     [provider]  oxybutynin (DITROPAN) 5 MG tablet Take 5 mg by mouth 2 (two) times daily.    [provider]  OXYGEN Inhale 2 L/min into the lungs as needed.  02/15/18   [provider]  polyethylene glycol (MIRALAX / GLYCOLAX) packet Give 17 gm by mouth once every morning 03/31/18   [provider]  Rivaroxaban (XARELTO) 15 MG TABS tablet Take 15 mg by mouth every evening.     [provider]  rOPINIRole (REQUIP) 0.5 MG tablet TAKE 1 TABLET BY MOUTH AT  BEDTIME 05/30/17   Leone Haven, MD  senna (SENOKOT) 8.6 MG TABS tablet Take 2 tablets by mouth at bedtime.    [provider]  Skin Protectants, Misc. (ENDIT EX) Apply topically to buttock three times daily as needed 02/04/18   [provider]  traMADol (ULTRAM) 50 MG tablet Take 1 tablet (50 mg total) by mouth every 12 (twelve) hours. 05/02/18   Medina-Vargas, Monina C, NP  trolamine salicylate (ASPERCREME) 10 % cream Apply 1 application topically 3 (three) times daily. Apply to right knee and ankle    [provider]     Allergies Amlodipine, Clopidogrel bisulfate, Kenalog [triamcinolone acetonide], Lisinopril, Losartan, and Sulfa antibiotics  Family History  Problem Relation Age of Onset  . COPD Mother   . Pulmonary fibrosis Mother   . Heart attack Father   . Heart disease Father   . Stroke Father   . Cancer Sister   . Cancer Sister   . Clotting disorder Sister   . Heart attack Sister   . Arthritis Other   . Breast cancer Sister   . Colon cancer Neg Hx   . Esophageal cancer Neg Hx   . Stomach cancer Neg Hx     Social History Social History   Tobacco Use  . Smoking status: Never Smoker  . Smokeless tobacco: Never Used  Substance Use Topics  . Alcohol use: No  . Drug use: No    Review of Systems   Review of Systems  Unable to perform ROS: Mental status change  Constitutional: Positive for fatigue.  Neurological: Positive for weakness.     ____________________________________________  PHYSICAL EXAM:      VITAL SIGNS: ED Triage Vitals [08/20/18 1215]  Enc Vitals Group     BP (!) 142/72     Pulse Rate 80     Resp 18     Temp 98.1 F (36.7 C)     Temp Source Oral     SpO2 100 %     Weight      Height      Head Circumference      Peak Flow      Pain Score      Pain Loc      Pain Edu?      Excl. in [place]?      Physical Exam Vitals signs and nursing note reviewed.  Constitutional:      General: She is not in acute  distress.    Appearance: She is well-developed.     Comments: Elderly, frail,  HENT:     Head: Normocephalic and atraumatic.     Comments: Mildly dry mucous membranes Eyes:     Conjunctiva/sclera: Conjunctivae normal.  Neck:     Musculoskeletal: Neck supple.  Cardiovascular:     Rate and Rhythm: Normal rate and regular rhythm.     Heart sounds: Normal heart sounds. No murmur. No friction rub.  Pulmonary:     Effort: Pulmonary effort is normal. No respiratory distress.     Breath sounds: Normal breath sounds. No wheezing or rales.  Abdominal:      General: There is no distension.     Palpations: Abdomen is soft.     Tenderness: There is no abdominal tenderness.     Comments: No apparent tenderness  Skin:    General: Skin is warm.     Capillary Refill: Capillary refill takes less than 2 seconds.     Comments: Stage II-III sacral decubitus ulcer noted along upper gluteal cleft.  No deep tracking noted.  No surrounding erythema.  Neurological:     Motor: No abnormal muscle tone.     Comments: Drowsy, responds to painful stimuli but does not answer questions      LOWER EXTREMITY EXAM: Right  INSPECTION & PALPATION: Large, necrotic wound overlying the calcaneus with approximately 3 to 4 cm of surrounding erythema and induration.  Foul odor noted.  Purulent, tracking base noted deep to the wound.  There is largely intact black eschar over the wound.  SENSORY: sensation is intact to light touch in:  Superficial peroneal nerve distribution (over dorsum of foot) Deep peroneal nerve distribution (over first dorsal web space) Sural nerve distribution (over lateral aspect 5th metatarsal) Saphenous nerve distribution (over medial instep)  MOTOR:  + Motor EHL (great toe dorsiflexion) + FHL (great toe plantar flexion)  + TA (ankle dorsiflexion)  + GSC (ankle plantar flexion)  VASCULAR: 2+ dorsalis pedis and posterior tibialis pulses Capillary refill < 2 sec, toes warm and well-perfused  COMPARTMENTS: Soft, warm, well-perfused No pain with passive extension No parethesias   ____________________________________________   LABS (all labs ordered are listed, but only abnormal results are displayed)  Labs Reviewed  LACTIC ACID, PLASMA - Abnormal; Notable for the following components:      Result Value   Lactic Acid, Venous 3.0 (*)    All other components within normal limits  COMPREHENSIVE METABOLIC PANEL - Abnormal; Notable for the following components:   BUN 41 (*)    Creatinine, Ser 1.24 (*)    Albumin 2.8 (*)    GFR calc  non Af Amer 39 (*)    GFR calc Af Amer 46 (*)    All other components within normal limits  CBC WITH DIFFERENTIAL/PLATELET - Abnormal; Notable for the following components:   WBC 19.2 (*)    RBC 3.44 (*)    Hemoglobin 10.5 (*)    HCT 33.7 (*)    Platelets 509 (*)    Neutro Abs 15.6 (*)    Monocytes Absolute 1.3 (*)    Abs Immature Granulocytes 0.23 (*)    All other components within normal limits  SEDIMENTATION RATE - Abnormal; Notable for the following components:   Sed Rate 125 (*)    All other components within normal limits  CULTURE, BLOOD (ROUTINE X 2)  CULTURE, BLOOD (ROUTINE X 2)  LACTIC ACID, PLASMA  C-REACTIVE PROTEIN    ____________________________________________  EKG: None  ________________________________________  RADIOLOGY All imaging, including plain films, CT scans, and ultrasounds, independently reviewed by me, and interpretations confirmed via formal radiology reads.  ED MD interpretation:   Plain film: Soft tissue ulceration over the calcaneus with bony changes concerning for osteomyelitis  Official radiology report(s): Dg Foot Complete Right  Result Date: 08/20/2018 CLINICAL DATA:  Pain right posterior heel. Open wound. History of gangrene. History of diabetes. EXAM: RIGHT FOOT COMPLETE - 3+ VIEW COMPARISON:  07/21/2017. FINDINGS: Overlying bandage is present making evaluation difficult. Diffuse osteopenia degenerative change. Soft tissue ulceration noted over the calcaneus. Underlying subtle calcaneal erosions may be present. Osteomyelitis cannot be excluded. MRI can be obtained to further evaluate as needed. IMPRESSION: Soft tissue ulceration noted over the calcaneus. Underlying subtle calcaneal erosions may be present. Osteomyelitis cannot be excluded. MRI can be obtained for further evaluation AS needed. Electronically Signed   By: Marcello Moores  Register   On: 08/20/2018 13:58    ____________________________________________  PROCEDURES   Procedure(s)  performed (including Critical Care):  .Critical Care Performed by: Duffy Bruce, MD Authorized by: Duffy Bruce, MD   Critical care provider statement:    Critical care time (minutes):  45   Critical care time was exclusive of:  Separately billable procedures and treating other patients and teaching time   Critical care was necessary to treat or prevent imminent or life-threatening deterioration of the following conditions:  Cardiac failure, circulatory failure and sepsis   Critical care was time spent personally by me on the following activities:  Development of treatment plan with patient or surrogate, discussions with consultants, evaluation of patient's response to treatment, examination of patient, obtaining history from patient or surrogate, ordering and performing treatments and interventions, ordering and review of laboratory studies, ordering and review of radiographic studies, pulse oximetry, re-evaluation of patient's condition and review of old charts   I assumed direction of critical care for this patient from another provider in my specialty: no      ____________________________________________  INITIAL IMPRESSION / MDM / [place] / ED COURSE  As part of my medical decision making, I reviewed the following data within the electronic MEDICAL RECORD NUMBER Notes from prior ED visits and [place] Controlled Substance Database      *Jimmi F Akerley was evaluated in Emergency Department on 08/20/2018 for the symptoms described in the history of present illness. She was evaluated in the context of the global COVID-19 pandemic, which necessitated consideration that the patient might be at risk for infection with the SARS-CoV-2 virus that causes COVID-19. Institutional protocols and algorithms that pertain to the evaluation of patients at risk for COVID-19 are in a state of rapid change based on information released by regulatory bodies including the CDC and federal and state  organizations. These policies and algorithms were followed during the patient's care in the ED.  Some ED evaluations and interventions may be delayed as a result of limited staffing during the pandemic.*      Medical Decision Making: 85 yo F with PMHx above here with altered mental status, new wounds over her sacrum and calcaneus.  Imaging, labs reviewed as above.  Patient with significant leukocytosis with left shift, lactic acidosis of 3, and likely mild prerenal AKI.  Code sepsis initiated with broad-spectrum antibiotics.  IV fluids given.  Discussed case with Dr. Elvina Mattes of podiatry, who will see the patient.  N.p.o. at this time.  Will be admitted to medicine.  Regarding her sacral wound, will need wound care, but do  not suspect this is source of infection at this time.  ____________________________________________  FINAL CLINICAL IMPRESSION(S) / ED DIAGNOSES  Final diagnoses:  Sepsis due to cellulitis ([place])  Osteomyelitis of right foot, unspecified type ([place])     MEDICATIONS GIVEN DURING THIS VISIT:  Medications  0.9 %  sodium chloride infusion (has no administration in time range)  vancomycin (VANCOCIN) 1,500 mg in sodium chloride 0.9 % 500 mL IVPB (has no administration in time range)  ceFEPIme (MAXIPIME) 2 g in sodium chloride 0.9 % 100 mL IVPB (2 g Intravenous New Bag/Given 08/20/18 1412)  sodium chloride 0.9 % bolus 1,000 mL (has no administration in time range)  sodium chloride 0.9 % bolus 1,000 mL (has no administration in time range)  vancomycin variable dose per unstable renal function (pharmacist dosing) (has no administration in time range)  ceFEPIme (MAXIPIME) 1 g in sodium chloride 0.9 % 100 mL IVPB (has no administration in time range)     ED Discharge Orders    None       Note:  This document was prepared using Dragon voice recognition software and may include unintentional dictation errors.   Duffy Bruce, MD 08/20/18 1442    Duffy Bruce, MD  08/28/18 [phone]

## 2018-08-20 NOTE — ED Notes (Signed)
Placed an extra pillow under pt leg to elevate heel off bed. Pt now has 2 pillows underneath leg.

## 2018-08-20 NOTE — Progress Notes (Signed)
Family Meeting Note  Advance Directive:yes  Today a meeting took place with the daughter.  Patient is unable to participate due AQ:[id number] capacity Drowsy.   The following clinical team members were present during this meeting:MD  The following were discussed:Patient's diagnosis: Diabetic foot infection, possible osteomyelitis, atrial fibrillation, coronary artery disease status post stent, history of stroke and bedbound status, Patient's progosis: Unable to determine and Goals for treatment: DNR  Additional follow-up to be provided: Podiatry.  Time spent during discussion:20 minutes  [name], MD

## 2018-08-21 DIAGNOSIS — E785 Hyperlipidemia, unspecified: Secondary | ICD-10-CM

## 2018-08-21 DIAGNOSIS — M869 Osteomyelitis, unspecified: Secondary | ICD-10-CM

## 2018-08-21 DIAGNOSIS — E119 Type 2 diabetes mellitus without complications: Secondary | ICD-10-CM

## 2018-08-21 DIAGNOSIS — L97516 Non-pressure chronic ulcer of other part of right foot with bone involvement without evidence of necrosis: Secondary | ICD-10-CM

## 2018-08-21 LAB — GLUCOSE, CAPILLARY
Glucose-Capillary: 122 mg/dL — ABNORMAL HIGH (ref 70–99)
Glucose-Capillary: 128 mg/dL — ABNORMAL HIGH (ref 70–99)
Glucose-Capillary: 233 mg/dL — ABNORMAL HIGH (ref 70–99)
Glucose-Capillary: 151 mg/dL — ABNORMAL HIGH (ref 70–99)

## 2018-08-21 LAB — CBC
HCT: 27.1 % — ABNORMAL LOW (ref 36.0–46.0)
Hemoglobin: 8.7 g/dL — ABNORMAL LOW (ref 12.0–15.0)
MCH: 30.4 pg (ref 26.0–34.0)
MCHC: 32.1 g/dL (ref 30.0–36.0)
MCV: 94.8 fL (ref 80.0–100.0)
Platelets: 389 10*3/uL (ref 150–400)
RBC: 2.86 MIL/uL — ABNORMAL LOW (ref 3.87–5.11)
RDW: 13 % (ref 11.5–15.5)
WBC: 15.1 10*3/uL — ABNORMAL HIGH (ref 4.0–10.5)
nRBC: 0 % (ref 0.0–0.2)

## 2018-08-21 LAB — BASIC METABOLIC PANEL
Anion gap: 8 (ref 5–15)
BUN: 27 mg/dL — ABNORMAL HIGH (ref 8–23)
CO2: 23 mmol/L (ref 22–32)
Calcium: 8.1 mg/dL — ABNORMAL LOW (ref 8.9–10.3)
Chloride: 104 mmol/L (ref 98–111)
Creatinine, Ser: 0.83 mg/dL (ref 0.44–1.00)
GFR calc Af Amer: >60 mL/min (ref >60)
GFR calc non Af Amer: >60 mL/min (ref >60)
Glucose, Bld: 187 mg/dL — ABNORMAL HIGH (ref 70–99)
Potassium: 3.5 mmol/L (ref 3.5–5.1)
Sodium: 135 mmol/L (ref 135–145)

## 2018-08-21 LAB — MRSA PCR SCREENING: MRSA by PCR: NEGATIVE

## 2018-08-21 MED ORDER — VITAMIN C 500 MG PO TABS
250.0000 mg | ORAL_TABLET | Freq: Two times a day (BID) | ORAL | Status: DC
  Administered 2018-08-21 – 2018-09-02 (×21): 250 mg via ORAL
  Filled 2018-08-21 (×23): qty 1

## 2018-08-21 MED ORDER — ENSURE ENLIVE PO LIQD
237.0000 mL | Freq: Two times a day (BID) | ORAL | Status: DC
  Administered 2018-08-22 – 2018-09-02 (×16): 237 mL via ORAL

## 2018-08-21 MED ORDER — VANCOMYCIN HCL IN DEXTROSE 750-5 MG/150ML-% IV SOLN
750.0000 mg | INTRAVENOUS | Status: DC
  Administered 2018-08-21 – 2018-08-30 (×10): 750 mg via INTRAVENOUS
  Filled 2018-08-21 (×11): qty 150

## 2018-08-21 MED ORDER — SODIUM CHLORIDE 0.9 % IV SOLN
2.0000 g | Freq: Two times a day (BID) | INTRAVENOUS | Status: DC
  Administered 2018-08-21 – 2018-08-30 (×17): 2 g via INTRAVENOUS
  Filled 2018-08-21 (×24): qty 2

## 2018-08-21 MED ORDER — ENOXAPARIN SODIUM 80 MG/0.8ML ~~LOC~~ SOLN
1.0000 mg/kg | Freq: Two times a day (BID) | SUBCUTANEOUS | Status: DC
  Administered 2018-08-21 – 2018-08-30 (×18): 65 mg via SUBCUTANEOUS
  Filled 2018-08-21 (×19): qty 0.8

## 2018-08-21 MED ORDER — ADULT MULTIVITAMIN W/MINERALS CH
1.0000 | ORAL_TABLET | Freq: Every day | ORAL | Status: DC
  Administered 2018-08-22 – 2018-09-02 (×10): 1 via ORAL
  Filled 2018-08-21 (×11): qty 1

## 2018-08-21 NOTE — Consult Note (Signed)
ANTICOAGULATION CONSULT NOTE - Follow Up Consult  Pharmacy Consult for Enoxaparin  Indication: atrial fibrillation  Allergies  Allergen Reactions  . Amlodipine Cough  . Clopidogrel Bisulfate Cough  . Kenalog [Triamcinolone Acetonide] Other (See Comments)    unknown  . Lisinopril Cough  . Losartan Cough  . Sulfa Antibiotics Hives and Rash    Patient Measurements: Weight: 144 lb (65.3 kg)   Vital Signs: Temp: 97.8 F (36.6 C) (06/24 0743) Temp Source: Oral (06/24 0743) BP: 143/51 (06/24 0743) Pulse Rate: 82 (06/24 0743)  Labs: Recent Labs    08/20/18 1307 08/20/18 1803 08/21/18 0338  HGB 10.5*  --  8.7*  HCT 33.7*  --  27.1*  PLT 509*  --  389  APTT  --  42*  --   CREATININE 1.24*  --  0.83    Estimated Creatinine Clearance: 42 mL/min (by C-G formula based on SCr of 0.83 mg/dL).   Medications:  Xarelto last given on 08/19/18 @ 1800  Assessment: Patient is a resident a [facility] who presents with a wound infection. Xarelto is being held for possible foot surgery. CrCl < 30 mL/min  6/24: SCr 1.24>0.83 CrCl improved from 28 to 42 ml/min  Goal of Therapy:  Monitor platelets by anticoagulation protocol: Yes   Plan:  Change order from Enoxaparin 1 mg/kg SQ Q24H to q12h.  Will need SCr and CBC at least every three days per policy.   Pharmacy will continue to follow.   Rocky Morel 08/21/2018,12:17 PM

## 2018-08-21 NOTE — Consult Note (Signed)
[facility] Vascular Consult Note  MRN : [id number]  Abigail Boyd is a 85 y.o. ([birth year]) female who presents with chief complaint of  Chief Complaint  Patient presents with  . Wound Infection   History of Present Illness:  Of note: The patient is a poor historian due to what seems like dementia however the daughter denies any dementia or memory issues.  Information for this consult was obtained from prior epic notation, the bedside nurse and the patients daughter.  The patient is an 85 year old female resident at [facility] who is nonverbal due to previous CVA.  The patient was sent to the [facility] emergency department yesterday evening at the recommendation of the [facility]'s wound center with a chief complaint of worsening right lower extremity foot wounds.  The patient has an extensive past medical history including tachycardia-bradycardia syndrome, stroke (2016, 2019), spinal stenosis of lumbar region, paroxysmal atrial fibrillation, obstructive sleep apnea, hypertension, hyperlipidemia, glaucoma, GERD, diverticulitis, diabetes type 2, chronic kidney disease, coronary artery disease, bilateral cataracts, asthma, anxiety, with chronic wound to the right heel.  As per the patient's daughter, the patient had a small wound to her ankle for the last several weeks.  Apparently over the last "2 weeks" it "has worsened".  The patient's daughter notes that she was unable to see his progression due to the restrictions placed on visitation due to coronavirus.  Patient's daughter also noted her to be increasingly confused and less responsive over the last several days.  The daughter notes that she also has a sacral ulcer.  As per the daughter, the patient is relatively bedbound.  X-rays and MRI of the right foot are notable for severe involvement of bone with infection and osteomyelitis along with tenosynovitis.    Vascular surgery was consulted by Dr. Elvina Mattes in regard to possible revascularization vs amputation. Current Facility-Administered Medications  Medication Dose Route Frequency Provider Last Rate Last Dose  . 0.9 %  sodium chloride infusion   Intravenous Once Duffy Bruce, MD      . ceFEPIme (MAXIPIME) 2 g in sodium chloride 0.9 % 100 mL IVPB  2 g Intravenous Q12H Rocky Morel, RPH 200 mL/hr at 08/21/18 1247 2 g at 08/21/18 1247  . docusate sodium (COLACE) capsule 100 mg  100 mg Oral Daily [name], MD   100 mg at 08/21/18 0847  . docusate sodium (COLACE) capsule 100 mg  100 mg Oral BID PRN [name], MD      . enoxaparin (LOVENOX) injection 65 mg  1 mg/kg Subcutaneous Q12H Rocky Morel, RPH   65 mg at 08/21/18 1246  . FLUoxetine (PROZAC) capsule 40 mg  40 mg Oral Daily [name], MD   40 mg at 08/21/18 0845  . furosemide (LASIX) tablet 20 mg  20 mg Oral Daily [name], MD   20 mg at 08/21/18 0846  . gabapentin (NEURONTIN) capsule 300 mg  300 mg Oral TID [name], MD   300 mg at 08/21/18 0847  . insulin aspart (novoLOG) injection 0-5 Units  0-5 Units Subcutaneous QHS [name], MD      . insulin aspart (novoLOG) injection 0-9 Units  0-9 Units Subcutaneous TID WC [name], MD   1 Units at 08/21/18 1245  . insulin glargine (LANTUS) injection 55 Units  55 Units Subcutaneous QHS [name], MD   55 Units at 08/20/18 2308  . latanoprost (XALATAN) 0.005 % ophthalmic solution 1  drop  1 drop Both Eyes QHS [name], MD   1 drop at 08/20/18 2307  . levETIRAcetam (KEPPRA) tablet 500 mg  500 mg Oral BID [name], MD   500 mg at 08/21/18 0845  . metoprolol tartrate (LOPRESSOR) tablet 12.5 mg  12.5 mg Oral Daily [name], MD   12.5 mg at 08/21/18 0846  . morphine 2 MG/ML injection 1 mg  1 mg Intravenous Q4H PRN [name], MD      .  nitroGLYCERIN (NITROSTAT) SL tablet 0.4 mg  0.4 mg Sublingual Q5 min PRN [name], MD      . oxybutynin (DITROPAN) tablet 5 mg  5 mg Oral BID [name], MD   5 mg at 08/21/18 0845  . oxyCODONE-acetaminophen (PERCOCET/ROXICET) 5-325 MG per tablet 1 tablet  1 tablet Oral Q6H PRN [name], MD   1 tablet at 08/21/18 0846  . pantoprazole (PROTONIX) EC tablet 40 mg  40 mg Oral Daily [name], MD   40 mg at 08/21/18 0846  . polyethylene glycol (MIRALAX / GLYCOLAX) packet 17 g  17 g Oral Daily [name], MD      . rOPINIRole (REQUIP) tablet 0.5 mg  0.5 mg Oral QHS [name], MD   0.5 mg at 08/20/18 2306  . senna (SENOKOT) tablet 17.2 mg  2 tablet Oral QHS [name], MD   17.2 mg at 08/20/18 2306  . timolol (TIMOPTIC) 0.5 % ophthalmic solution 1 drop  1 drop Both Eyes QHS [name], MD   1 drop at 08/20/18 2307  . vancomycin (VANCOCIN) IVPB 750 mg/150 ml premix  750 mg Intravenous Q24H Rocky Morel, [facility]       Past Medical History:  Diagnosis Date  . Abnormal nuclear stress test 06/21/2011   Inferolateral reversible defect;--> cardiac cath & PCI of CxOM, occluded RCA with collaterals;; followup Myoview 11/2012: Low risk. Fixed basal inferior artifact normal EF. No ischemia  . Anxiety   . Asthma   . Bilateral cataracts    Status post stroke or correction  . CAD S/P percutaneous coronary angioplasty 06/2011   s/P PCI to proximal OM1 w/ DES; occluded RCA with bridging and L-R collaterals (medical management)  . Chronic kidney disease (CKD), stage II (mild)    Related to current bladder infections (although diabetes cannot be excluded)  . Chronotropic incompetence with sinus node dysfunction [facility]) October 2013   On CPET test; beta blockers reduced  . Diabetes mellitus type 2, controlled ([place])    On oral medications  . Diverticulitis   . Essential hypertension    Allowing for permissive  hypertension to avoid orthostatic hypotension  . GERD (gastroesophageal reflux disease)    On PPI  . Glaucoma   . History of syncope    Per EP - neurocardiogenic & not Bradycardia related (no PPM)  . History of unstable angina 06/13/2011   Jaw pain awakening from sleep -- Myoview --CATH --> PCI  . Hyperlipidemia with target LDL less than 70    HDL at goal, LDL not at goal, borderline triglycerides. On Crestor 20 mg  . Migraines   . OSA (obstructive sleep apnea)    hx bladder infections  . Osteoarthritis   . PAF (paroxysmal atrial fibrillation) ([place]) 10-11/ 2014   CardioNet Event Monitor: NSR & S Brady -- Rates 50-100; Total A. fib burden 35 hours and 27 minutes. 1296 episodes, longest was 1 hour 29 minutes. Rate ranged from 52-169 beats per minute.  . Seasonal  allergies   . Shortness of breath on exertion October 2013   2-D echo: Normal EF>55%, Gr 1DD, mild aortic sclerosis; Evaluated with CPET - peak VO2 97%; Chronotropic Incompetence (submaximal effort)  . Spinal stenosis of lumbar region 11/2011  . Stroke ([place]) 06/2017  . Stroke ([place]) 11/2014  . Tachycardia-bradycardia syndrome ([place]) 11/2012  . Urge incontinence    Past Surgical History:  Procedure Laterality Date  . APPENDECTOMY    . BREAST BIOPSY     both breast  . cataracts     both eyes  . COLONOSCOPY    . CORONARY ANGIOPLASTY WITH STENT PLACEMENT  07/05/2011   Promus Element DES 2.5 mm x 16 mm-post dilated to 2.65 mm CX-Proximal OM1  . Holter Monitor  04/2015   Sinus rhythm with rates 52-149 BPM. Isolated PACs with rare couplets and bigeminy. Multiple short runs of PAT/PSVT. Arrhythmia run 120 bpm for 204 beats. 14% of time was in A. fib/flutter. - Reviewed by Dr. Caryl Comes. Not thought to be significant enough for her syncope.  Marland Kitchen KNEE ARTHROSCOPY WITH MEDIAL MENISECTOMY Right 06/24/2014   Procedure: RIGHT KNEE ARTHROSCOPY WITH partial lateral MENISECTOMY, abrasion chondroplasty of medial femoral condyle and patella,  microfracture technique;  Surgeon: Latanya Maudlin, MD;  Location: [facility] ORS;  Service: Orthopedics;  Laterality: Right;  . LEFT HEART CATHETERIZATION WITH CORONARY ANGIOGRAM N/A 07/06/2011   Procedure: LEFT HEART CATHETERIZATION WITH CORONARY ANGIOGRAM;  Surgeon: Leonie Man, MD;  Location: [facility] CATH LAB: Unstable Angina --> Myoview wiht Inf-Lat Ischemia.  Proximal OM1 lesion --> PCI; 100% mid RCA with right to right and left to right bridging collaterals from circumflex RPL and LAD the PDA.  Marland Kitchen NM MYOVIEW LTD  October 2014    Low risk. Fixed basal inferior artifact normal EF. No ischemia --> (as compared to pre-PCI Myoview revealing inferolateral ischemia)  . rotator cuff Right   . TRANSTHORACIC ECHOCARDIOGRAM  11/2014   EF 65-70%, Mod LVH. Normal wall motion. Gr 1 DD. Normal valves.  Marland Kitchen VAGINAL HYSTERECTOMY     Social History Social History   Tobacco Use  . Smoking status: Never Smoker  . Smokeless tobacco: Never Used  Substance Use Topics  . Alcohol use: No  . Drug use: No   Family History Family History  Problem Relation Age of Onset  . COPD Mother   . Pulmonary fibrosis Mother   . Heart attack Father   . Heart disease Father   . Stroke Father   . Cancer Sister   . Cancer Sister   . Clotting disorder Sister   . Heart attack Sister   . Arthritis Other   . Breast cancer Sister   . Colon cancer Neg Hx   . Esophageal cancer Neg Hx   . Stomach cancer Neg Hx   Denies family history of peripheral artery disease, venous disease or renal disease.  Allergies  Allergen Reactions  . Amlodipine Cough  . Clopidogrel Bisulfate Cough  . Kenalog [Triamcinolone Acetonide] Other (See Comments)    unknown  . Lisinopril Cough  . Losartan Cough  . Sulfa Antibiotics Hives and Rash   REVIEW OF SYSTEMS (Negative unless checked)  Constitutional: [] Weight loss  [] Fever  [] Chills Cardiac: [] Chest pain   [] Chest pressure   [] Palpitations   [] Shortness of breath when laying flat   [] Shortness of  breath at rest   [] Shortness of breath with exertion. Vascular:  [] Pain in legs with walking   [x] Pain in legs at rest   [x] Pain in  legs when laying flat   [] Claudication   [] Pain in feet when walking  [x] Pain in feet at rest  [x] Pain in feet when laying flat   [] History of DVT   [] Phlebitis   [x] Swelling in legs   [] Varicose veins   [x] Non-healing ulcers Pulmonary:   [] Uses home oxygen   [] Productive cough   [] Hemoptysis   [] Wheeze  [] COPD   [] Asthma Neurologic:  [] Dizziness  [] Blackouts   [] Seizures   [] History of stroke   [] History of TIA  [] Aphasia   [] Temporary blindness   [] Dysphagia   [] Weakness or numbness in arms   [] Weakness or numbness in legs Musculoskeletal:  [] Arthritis   [] Joint swelling   [] Joint pain   [] Low back pain Hematologic:  [] Easy bruising  [] Easy bleeding   [] Hypercoagulable state   [] Anemic  [] Hepatitis Gastrointestinal:  [] Blood in stool   [] Vomiting blood  [] Gastroesophageal reflux/heartburn   [] Difficulty swallowing. Genitourinary:  [x] Chronic kidney disease   [] Difficult urination  [] Frequent urination  [] Burning with urination   [] Blood in urine Skin:  [] Rashes   [x] Ulcers   [x] Wounds Psychological:  [] History of anxiety   []  History of major depression.  Physical Examination  Vitals:   08/20/18 1730 08/20/18 2053 08/21/18 0508 08/21/18 0743  BP: (!) 142/58 (!) 148/68 (!) 138/56 (!) 143/51  Pulse: 90 90 79 82  Resp: 14 16 16 17   Temp:  98.3 F (36.8 C) 98.4 F (36.9 C) 97.8 F (36.6 C)  TempSrc:   Oral Oral  SpO2: 97% 98% 94% 97%  Weight:       Body mass index is 24.72 kg/m. Gen:  WD/WN, NAD, Lethargic. Unable to answer questions appropriately.   Head: [place]/AT, No temporalis wasting. Prominent temp pulse not noted. Ear/Nose/Throat: Hearing grossly intact, nares w/o erythema or drainage, oropharynx w/o Erythema/Exudate Eyes: Sclera non-icteric, conjunctiva clear Neck: Trachea midline.  No JVD.  Pulmonary:  Good air movement, respirations not labored,  equal bilaterally.  Cardiac: RRR, normal S1, S2. Vascular:  Vessel Right Left  Radial Palpable Palpable  Ulnar Palpable Palpable  Brachial Palpable Palpable  Carotid Palpable, without bruit Palpable, without bruit  Aorta Not palpable N/A  Femoral Palpable Palpable  Popliteal Palpable Palpable  PT Non-Palpable Non-Palpable  DP Non-Palpable Non-Palpable   Right Lower Extremity: Thigh soft, calf soft. Extremity warm distally to toes. Unable to palpate pedal pulses. Large heel ulceration with eschar.   Gastrointestinal: soft, non-tender/non-distended. No guarding/reflex.  Musculoskeletal: M/S 5/5 throughout.  Extremities without ischemic changes.  No deformity or atrophy. Moderate edema. Neurologic: Sensation grossly intact in extremities.   Psychiatric: Judgment intact, Mood & affect appropriate for pt's clinical situation. Dermatologic: See above Lymph : No Cervical, Axillary, or Inguinal lymphadenopathy.  CBC Lab Results  Component Value Date   WBC 15.1 (H) 08/21/2018   HGB 8.7 (L) 08/21/2018   HCT 27.1 (L) 08/21/2018   MCV 94.8 08/21/2018   PLT 389 08/21/2018   BMET    Component Value Date/Time   NA 135 08/21/2018 0338   NA 140 04/22/2015 1451   K 3.5 08/21/2018 0338   CL 104 08/21/2018 0338   CO2 23 08/21/2018 0338   GLUCOSE 187 (H) 08/21/2018 0338   BUN 27 (H) 08/21/2018 0338   BUN 26 04/22/2015 1451   CREATININE 0.83 08/21/2018 0338   CALCIUM 8.1 (L) 08/21/2018 0338   GFRNONAA >60 08/21/2018 0338   GFRAA >60 08/21/2018 4235   Estimated Creatinine Clearance: 42 mL/min (by C-G formula based on SCr of 0.83  mg/dL).  COAG Lab Results  Component Value Date   INR 1.22 12/21/2017   INR 1.13 07/21/2017   INR 2.94 (H) 12/28/2014   Radiology Mr Heel Right Wo Contrast  Result Date: 08/20/2018 CLINICAL DATA:  Large heel ulcer. EXAM: MR OF THE RIGHT HEEL WITHOUT CONTRAST TECHNIQUE: Multiplanar, multisequence MR imaging of the right foot was performed. No intravenous  contrast was administered. COMPARISON:  Radiographs 08/20/2018 FINDINGS: There is a large heel ulcer right down to the bone with underlying osteomyelitis involving the posterior third of the calcaneus. There is also likely septic retrocalcaneal bursitis and the Achilles tendon is partially detached from the calcaneus and surrounded by fluid/pus. Signal abnormality in the tendon could be septic tendinitis. No discrete drainable fluid collection/abscess. Diffuse myositis involving the short flexor muscles of the foot but no definite findings for pyomyositis. The tibiotalar joint demonstrates a small effusion but the cartilage is intact and the subtalar joints are intact. Sinus tarsi is normal. IMPRESSION: 1. Large and deep heel ulcer extending right down to the bone with surrounding granulation tissue and cellulitis. 2. Myofasciitis involving the short flexor muscles but no definite findings for pyomyositis. 3. Extensive osteomyelitis involving the posterior third of the calcaneus. 4. The Achilles tendon is partially torn from its calcaneal attachment site and there could be septic tendinitis and septic retrocalcaneal bursitis. Electronically Signed   By: Marijo Sanes M.D.   On: 08/20/2018 20:47   Dg Chest Portable 1 View  Result Date: 08/20/2018 CLINICAL DATA:  Shortness of breath. EXAM: PORTABLE CHEST 1 VIEW COMPARISON:  Radiograph of December 24, 2017. FINDINGS: The heart size and mediastinal contours are within normal limits. Both lungs are clear. No pneumothorax or pleural effusion is noted. The visualized skeletal structures are unremarkable. IMPRESSION: No active disease. Electronically Signed   By: Marijo Conception M.D.   On: 08/20/2018 14:52   Dg Foot Complete Right  Result Date: 08/20/2018 CLINICAL DATA:  Pain right posterior heel. Open wound. History of gangrene. History of diabetes. EXAM: RIGHT FOOT COMPLETE - 3+ VIEW COMPARISON:  07/21/2017. FINDINGS: Overlying bandage is present making evaluation  difficult. Diffuse osteopenia degenerative change. Soft tissue ulceration noted over the calcaneus. Underlying subtle calcaneal erosions may be present. Osteomyelitis cannot be excluded. MRI can be obtained to further evaluate as needed. IMPRESSION: Soft tissue ulceration noted over the calcaneus. Underlying subtle calcaneal erosions may be present. Osteomyelitis cannot be excluded. MRI can be obtained for further evaluation AS needed. Electronically Signed   By: Marcello Moores  Register   On: 08/20/2018 13:58   Assessment/Plan The patient is an 85 year old bed-bound female resident at [facility] with multiple medical issues who is nonverbal due to previous CVA.  The patient was sent to the [facility] emergency department yesterday evening at the recommendation of the [facility] wound center with a chief complaint of worsening right lower extremity foot wounds. 1. Right Foot Wound: As per daughter, the wound was only a small blister few weeks ago.  It has clearly progressed in size and severity now with osteomyelitis.  We will plan on angiogram this Friday with Dr. Delana Meyer to assess the patient's anatomy and contributing degree of atherosclerotic disease.  If possible an attempt can be made to revascularize the leg however even if arterial patency is restored wound debridement would be complicated and require VAC therapy for prolonged period of time.  Long-term IV antibiotics for osteomyelitis would also be needed.  Patient would be a  possible candidate for an above-the-knee amputation.  Since she is bedbound and non-ambulatory she would surely contracture with a below the knee amputation and eventually need revision to above-the-knee.  Would also consider possible palliative care? 2. Hyperlipidemia: Encouraged good control as its slows the progression of atherosclerotic disease. 3. Diabetes:  Encouraged good control as its slows the progression of  atherosclerotic disease.  Discussed with Dr. Francene Castle, PA-C  08/21/2018 1:23 PM  This note was created with Dragon medical transcription system.  Any error is purely unintentional

## 2018-08-21 NOTE — Progress Notes (Signed)
Abigail, Boyd ([id number]) Visit Report for 08/20/2018 Abuse/Suicide Risk Screen Details Patient Name: Abigail Boyd, Abigail Boyd. Date of Service: 08/20/2018 9:45 AM Medical Record Number: [id number] Patient Account Number: [id number] Date of Birth/Sex: May 29, 1929 (85 y.o. F) Treating RN: Army Melia Primary Care Zaliyah Meikle: Frazier Richards Other Clinician: Referring Kristapher [place]: Frazier Richards Treating Kord Monette/Extender: Melburn Hake, HOYT Weeks in Treatment: 0 Abuse/Suicide Risk Screen Items Answer ABUSE RISK SCREEN: If yes to any of the above questions, explain: pt unable to communicate Electronic Signature(s) Signed: 08/20/2018 3:33:36 PM By: Army Melia Entered By: Army Melia on 08/20/2018 10:39:39 Huffstetler, Daeja F. ([id number]) -------------------------------------------------------------------------------- Activities of Daily Living Details Patient Name: Abigail Boyd. Date of Service: 08/20/2018 9:45 AM Medical Record Number: [id number] Patient Account Number: [id number] Date of Birth/Sex: 1930-02-19 (85 y.o. F) Treating RN: Army Melia Primary Care Mirenda Baltazar: Frazier Richards Other Clinician: Referring Kandy Towery: Frazier Richards Treating Chi Garlow/Extender: Melburn Hake, HOYT Weeks in Treatment: 0 Activities of Daily Living Items Answer Activities of Daily Living (Please select one for each item) Drive Automobile Not Able Take Medications Need Assistance Use Telephone Need Assistance Care for Appearance Need Assistance Use Toilet Need Assistance Bath / Shower Need Assistance Dress Self Need Assistance Feed Self Need Assistance Walk Not Able Get In / Out Bed Not Able Housework Not Able Prepare Meals Not Able Handle Money Not Able Shop for Self Not Able Electronic Signature(s) Signed: 08/20/2018 3:33:36 PM By: Army Melia Entered By: Army Melia on 08/20/2018 10:40:31 Coldwell, Jordon F.  ([id number]) -------------------------------------------------------------------------------- Education Screening Details Patient Name: Abigail Boyd. Date of Service: 08/20/2018 9:45 AM Medical Record Number: [id number] Patient Account Number: [id number] Date of Birth/Sex: May 05, 1929 (85 y.o. F) Treating RN: Army Melia Primary Care Eleesha Purkey: Frazier Richards Other Clinician: Referring Eboni Coval: Frazier Richards Treating Jimie Kuwahara/Extender: Melburn Hake, HOYT Weeks in Treatment: 0 Primary Learner Assessed: Caregiver Daughter Reason Patient is not Primary Learner: Pt unable to communicate Learning Preferences/Education Level/Primary Language Learning Preference: Explanation, Demonstration Highest Education Level: High School Preferred Language: English Cognitive Barrier Language Barrier: No Translator Needed: No Memory Deficit: No Emotional Barrier: No Cultural/Religious Beliefs Affecting Medical Care: No Physical Barrier Impaired Vision: No Impaired Hearing: No Decreased Hand dexterity: No Knowledge/Comprehension Knowledge Level: Medium Comprehension Level: Medium Ability to understand written Medium instructions: Ability to understand verbal Medium instructions: Motivation Anxiety Level: Calm Cooperation: Cooperative Education Importance: Acknowledges Need Interest in Health Problems: Asks Questions Perception: Coherent Willingness to Engage in Self- High Management Activities: Readiness to Engage in Self- High Management Activities: Electronic Signature(s) Signed: 08/20/2018 3:33:36 PM By: Army Melia Entered By: Army Melia on 08/20/2018 10:41:19 Severtson, Keren F. ([id number]) -------------------------------------------------------------------------------- Fall Risk Assessment Details Patient Name: Abigail Boyd. Date of Service: 08/20/2018 9:45 AM Medical Record Number: [id number] Patient Account Number: [id number] Date of Birth/Sex: [birth year] (85 y.o.  F) Treating RN: Army Melia Primary Care Leea Rambeau: Frazier Richards Other Clinician: Referring Abree Romick: Frazier Richards Treating Airiana Elman/Extender: Melburn Hake, HOYT Weeks in Treatment: 0 Fall Risk Assessment Items Have you had 2 or more falls in the last 12 monthso 0 No Have you had any fall that resulted in injury in the last 12 monthso 0 Yes FALLS RISK SCREEN History of falling - immediate or within 3 months 25 Yes Secondary diagnosis (Do you have 2 or more medical diagnoseso) 15 Yes Ambulatory aid None/bed rest/wheelchair/nurse 0 Yes Crutches/cane/walker 0 No Furniture 0 No Intravenous therapy Access/Saline/Heparin Lock 0 No Gait/Transferring Normal/ bed rest/ wheelchair 0 Yes Weak (short steps with or without shuffle, stooped but able to lift head  while 10 Yes walking, may seek support from furniture) Impaired (short steps with shuffle, may have difficulty arising from chair, head 20 Yes down, impaired balance) Mental Status Oriented to own ability 0 No Electronic Signature(s) Signed: 08/20/2018 3:33:36 PM By: Army Melia Entered By: Army Melia on 08/20/2018 10:41:48 Thurmond, Mickaela F. ([id number]) -------------------------------------------------------------------------------- Foot Assessment Details Patient Name: Abigail Aase F. Date of Service: 08/20/2018 9:45 AM Medical Record Number: [id number] Patient Account Number: [id number] Date of Birth/Sex: 01-21-1930 (85 y.o. F) Treating RN: Army Melia Primary Care Maryhelen Lindler: Frazier Richards Other Clinician: Referring Brenetta Penny: Frazier Richards Treating Kynlea Blackston/Extender: Melburn Hake, HOYT Weeks in Treatment: 0 Foot Assessment Items [x]  Unable to perform due to altered mental status Site Locations + = Sensation present, - = Sensation absent, C = Callus, U = Ulcer R = Redness, W = Warmth, M = Maceration, PU = Pre-ulcerative lesion F = Fissure, S = Swelling, D = Dryness Assessment Right: Left: Other Deformity: No  No Prior Foot Ulcer: No No Prior Amputation: No No Charcot Joint: No No Ambulatory Status: Non-ambulatory Assistance Device: Wheelchair Gait: Administrator, arts) Signed: 08/20/2018 3:33:36 PM By: Army Melia Entered By: Army Melia on 08/20/2018 10:42:48 Whelpley, Jennier F. ([id number]) -------------------------------------------------------------------------------- Nutrition Risk Screening Details Patient Name: Abigail Boyd. Date of Service: 08/20/2018 9:45 AM Medical Record Number: [id number] Patient Account Number: [id number] Date of Birth/Sex: [birth year] (85 y.o. F) Treating RN: Army Melia Primary Care Seymone Forlenza: Frazier Richards Other Clinician: Referring Adewale Pucillo: Frazier Richards Treating Talea Manges/Extender: Melburn Hake, HOYT Weeks in Treatment: 0 Height (in): 64 Weight (lbs): 144 Body Mass Index (BMI): 24.7 Nutrition Risk Screening Items Score Screening NUTRITION RISK SCREEN: I have an illness or condition that made me change the kind and/or amount of 2 Yes food I eat I eat fewer than two meals per day 3 Yes I eat few fruits and vegetables, or milk products 2 Yes I have three or more drinks of beer, liquor or wine almost every day 0 No I have tooth or mouth problems that make it hard for me to eat 0 No I don't always have enough money to buy the food I need 0 No I eat alone most of the time 0 No I take three or more different prescribed or over-the-counter drugs a day 0 No Without wanting to, I have lost or gained 10 pounds in the last six months 0 No I am not always physically able to shop, cook and/or feed myself 0 No Nutrition Protocols Good Risk Protocol Moderate Risk Protocol Provide education on High Risk Proctocol 0 nutrition Risk Level: High Risk Score: 7 Electronic Signature(s) Signed: 08/20/2018 3:33:36 PM By: Army Melia Entered By: Army Melia on 08/20/2018 10:42:20

## 2018-08-21 NOTE — Progress Notes (Signed)
Initial Nutrition Assessment  DOCUMENTATION CODES:   Not applicable  INTERVENTION:   Ensure Enlive po BID, each supplement provides 350 kcal and 20 grams of protein  MVI daily   Vitamin C 250mg  po BID  Dysphagia 3 diet   NUTRITION DIAGNOSIS:   Increased nutrient needs related to wound healing as evidenced by increased estimated needs.  GOAL:   Patient will meet greater than or equal to 90% of their needs  MONITOR:   PO intake, Supplement acceptance, Labs, Weight trends, Skin, I & O's  REASON FOR ASSESSMENT:   Malnutrition Screening Tool    ASSESSMENT:   85 y.o. female with a known history of coronary artery disease status post stent, chronic kidney disease stage II, diabetes, hypertension, stroke- resides at [facility] for more than 1 year due to progressive weakness. At her baseline she is bedbound and non-verbal  RD working remotely.  Unable to speak with pt as she is non-verbal. Per chart review, suspect pt with poor appetite and oral intake at baseline. Pt appears to have lost 24lbs(14%) over the past 3 months; this is significant. There are no documented intakes from this admit. RD will add supplements and vitamins to support wound healing. RD will also change pt to a dysphagia 3 diet r/t h/o CVA and weakness. Per MD note, pt may require AKA.   Medications reviewed and include: colace, lovenox, lasix, insulin, protonix, miralax, senokot , cefepime, vancomycin   Labs reviewed: BUN 27(H)  wbc- 15.1(H), Hgb 8.7(L), Hct 27.1(L) cbgs- 122, 128 x 24 hrs AIC 7.2(H)- 02/2018  Unable to complete Nutrition-Focused physical exam at this time.   Diet Order:   Diet Order            Diet heart healthy/carb modified Room service appropriate? Yes; Fluid consistency: Thin  Diet effective now             EDUCATION NEEDS:   Not appropriate for education at this time  Skin:  Skin Assessment: Reviewed RN Assessment(ecchymosis, R heel gangrene with osteomyelitis,  sacral wound reported per pt's daughter)  Last BM:  pta  Height:   Ht Readings from Last 1 Encounters:  05/02/18 5\' 4"  (1.626 m)    Weight:   Wt Readings from Last 1 Encounters:  08/20/18 65.3 kg    Ideal Body Weight:  54.5 kg  BMI:  Body mass index is 24.72 kg/m.  Estimated Nutritional Needs:   Kcal:  1400-1600kcal/day  Protein:  70-80g/day  Fluid:  1.4L/day  Koleen Distance MS, RD, LDN Pager #- [phone] Office#- [phone] After Hours Pager: [phone]

## 2018-08-21 NOTE — Consult Note (Signed)
[facility]                                                      Patient Demographics  Abigail Boyd, is a 85 y.o. female   MRN: [id number]   DOB - 1929/12/09  Admit Date - 08/20/2018    Outpatient Primary MD for the patient is Kirk Ruths, MD  Consult requested in the [facility] by [name], *, On 08/21/2018    Reason for consult right heel ulcer and infection   With History of -  Past Medical History:  Diagnosis Date  . Abnormal nuclear stress test 06/21/2011   Inferolateral reversible defect;--> cardiac cath & PCI of CxOM, occluded RCA with collaterals;; followup Myoview 11/2012: Low risk. Fixed basal inferior artifact normal EF. No ischemia  . Anxiety   . Asthma   . Bilateral cataracts    Status post stroke or correction  . CAD S/P percutaneous coronary angioplasty 06/2011   s/P PCI to proximal OM1 w/ DES; occluded RCA with bridging and L-R collaterals (medical management)  . Chronic kidney disease (CKD), stage II (mild)    Related to current bladder infections (although diabetes cannot be excluded)  . Chronotropic incompetence with sinus node dysfunction [facility]) October 2013   On CPET test; beta blockers reduced  . Diabetes mellitus type 2, controlled ([place])    On oral medications  . Diverticulitis   . Essential hypertension    Allowing for permissive hypertension to avoid orthostatic hypotension  . GERD (gastroesophageal reflux disease)    On PPI  . Glaucoma   . History of syncope    Per EP - neurocardiogenic & not Bradycardia related (no PPM)  . History of unstable angina 06/13/2011   Jaw pain awakening from sleep -- Myoview --CATH --> PCI  . Hyperlipidemia with target LDL less than 70    HDL at goal, LDL not at goal, borderline triglycerides. On Crestor 20 mg  . Migraines   . OSA (obstructive sleep apnea)    hx  bladder infections  . Osteoarthritis   . PAF (paroxysmal atrial fibrillation) ([place]) 10-11/ 2014   CardioNet Event Monitor: NSR & S Brady -- Rates 50-100; Total A. fib burden 35 hours and 27 minutes. 1296 episodes, longest was 1 hour 29 minutes. Rate ranged from 52-169 beats per minute.  . Seasonal allergies   . Shortness of breath on exertion October 2013   2-D echo: Normal EF>55%, Gr 1DD, mild aortic sclerosis; Evaluated with CPET - peak VO2 97%; Chronotropic Incompetence (submaximal effort)  . Spinal stenosis of lumbar region 11/2011  . Stroke ([place]) 06/2017  . Stroke ([place]) 11/2014  . Tachycardia-bradycardia syndrome ([place]) 11/2012  . Urge incontinence       Past Surgical History:  Procedure Laterality Date  . APPENDECTOMY    . BREAST BIOPSY     both breast  . cataracts     both eyes  . COLONOSCOPY    . CORONARY ANGIOPLASTY WITH STENT PLACEMENT  07/05/2011   Promus Element DES 2.5 mm x 16 mm-post dilated to 2.65 mm CX-Proximal OM1  . Holter Monitor  04/2015   Sinus rhythm with rates 52-149 BPM. Isolated PACs with rare couplets and bigeminy. Multiple short runs of PAT/PSVT. Arrhythmia run 120 bpm for 204 beats. 14% of time was in A. fib/flutter. -  Reviewed by Dr. Caryl Comes. Not thought to be significant enough for her syncope.  Marland Kitchen KNEE ARTHROSCOPY WITH MEDIAL MENISECTOMY Right 06/24/2014   Procedure: RIGHT KNEE ARTHROSCOPY WITH partial lateral MENISECTOMY, abrasion chondroplasty of medial femoral condyle and patella, microfracture technique;  Surgeon: Latanya Maudlin, MD;  Location: [facility] ORS;  Service: Orthopedics;  Laterality: Right;  . LEFT HEART CATHETERIZATION WITH CORONARY ANGIOGRAM N/A 07/06/2011   Procedure: LEFT HEART CATHETERIZATION WITH CORONARY ANGIOGRAM;  Surgeon: Leonie Man, MD;  Location: [facility] CATH LAB: Unstable Angina --> Myoview wiht Inf-Lat Ischemia.  Proximal OM1 lesion --> PCI; 100% mid RCA with right to right and left to right bridging collaterals from circumflex RPL and LAD  the PDA.  Marland Kitchen NM MYOVIEW LTD  October 2014    Low risk. Fixed basal inferior artifact normal EF. No ischemia --> (as compared to pre-PCI Myoview revealing inferolateral ischemia)  . rotator cuff Right   . TRANSTHORACIC ECHOCARDIOGRAM  11/2014   EF 65-70%, Mod LVH. Normal wall motion. Gr 1 DD. Normal valves.  Marland Kitchen VAGINAL HYSTERECTOMY      in for   Chief Complaint  Patient presents with  . Wound Infection     HPI  Abigail Boyd  is a 85 y.o. female, this is been in nursing home nonambulatory to baseline knowledge.  Apparently developed a decubitus ulcer on the right heel.  Came to the emergency room yesterday with infection drainage and pain.    Social History Social History   Tobacco Use  . Smoking status: Never Smoker  . Smokeless tobacco: Never Used  Substance Use Topics  . Alcohol use: No    Family History Family History  Problem Relation Age of Onset  . COPD Mother   . Pulmonary fibrosis Mother   . Heart attack Father   . Heart disease Father   . Stroke Father   . Cancer Sister   . Cancer Sister   . Clotting disorder Sister   . Heart attack Sister   . Arthritis Other   . Breast cancer Sister   . Colon cancer Neg Hx   . Esophageal cancer Neg Hx   . Stomach cancer Neg Hx     Prior to Admission medications   Medication Sig Start Date End Date Taking? Authorizing Provider  azelastine (ASTELIN) 0.1 % nasal spray Place 2 sprays into both nostrils at bedtime. Use in each nostril as directed    Yes [provider]  docusate sodium (STOOL SOFTENER) 100 MG capsule Take 100 mg by mouth daily.    Yes [provider]  FLUoxetine (PROZAC) 40 MG capsule Take 40 mg by mouth daily.   Yes [provider]  furosemide (LASIX) 20 MG tablet Take 20 mg by mouth daily.  12/27/17  Yes [provider]  gabapentin (NEURONTIN) 300 MG capsule Take 300 mg by mouth 3 (three) times daily.    Yes [provider]  glucagon (GLUCAGON EMERGENCY) 1 MG  injection Inject 1 mg into the vein once as needed. Give 1 mg IM for Hypoglycemia (symptomatic) not responding to oral glucose, snack. May repeat x 1 after 15 minutes. USE ONLY IF PT IS OR IS BECOMING UNRESPONSIVE   Yes [provider]  HUMALOG KWIKPEN 100 UNIT/ML KiwkPen Inject 35 Units into the skin 3 (three) times daily with meals. Give with meals for cbg > = 150 02/28/18  Yes [provider]  Insulin Glargine (TOUJEO MAX SOLOSTAR) 300 UNIT/ML SOPN Inject 55 Units into the skin at bedtime.  Alternate abdominal injection sites. 02/28/18  Yes [provider]  latanoprost (XALATAN) 0.005 % ophthalmic solution Place 1 drop into both eyes at bedtime.    Yes [provider]  levETIRAcetam (KEPPRA) 500 MG tablet Take 1 tablet (500 mg total) by mouth 2 (two) times daily. 12/27/17  Yes Hosie Poisson, MD  levocetirizine (XYZAL) 5 MG tablet Take 2.5 mg by mouth daily.    Yes [provider]  Magnesium 500 MG CAPS Take 500 mg by mouth at bedtime.    Yes [provider]  metoprolol tartrate (LOPRESSOR) 25 MG tablet Take 12.5 mg by mouth daily.  01/29/18  Yes [provider]  mirtazapine (REMERON) 15 MG tablet Take 7.5 mg by mouth at bedtime.   Yes [provider]  nitroGLYCERIN (NITROSTAT) 0.4 MG SL tablet Place 0.4 mg under the tongue every 5 (five) minutes as needed for chest pain.   Yes [provider]  omeprazole (PRILOSEC) 40 MG capsule Take 40 mg by mouth daily.    Yes [provider]  oxybutynin (DITROPAN) 5 MG tablet Take 5 mg by mouth 2 (two) times daily.    Yes [provider]  oxyCODONE-acetaminophen (PERCOCET/ROXICET) 5-325 MG tablet Take 1 tablet by mouth 2 (two) times daily.   Yes [provider]  oxyCODONE-acetaminophen (PERCOCET/ROXICET) 5-325 MG tablet Take 1 tablet by mouth every 6 (six) hours as needed for moderate pain or severe pain.   Yes [provider]  polyethylene glycol  (MIRALAX / GLYCOLAX) packet Give 17 gm by mouth once every morning 03/31/18  Yes [provider]  Rivaroxaban (XARELTO) 15 MG TABS tablet Take 15 mg by mouth every evening.    Yes [provider]  rOPINIRole (REQUIP) 0.5 MG tablet TAKE 1 TABLET BY MOUTH AT  BEDTIME Patient taking differently: Take 0.5 mg by mouth at bedtime.  05/30/17  Yes Leone Haven, MD  senna (SENOKOT) 8.6 MG TABS tablet Take 2 tablets by mouth at bedtime.   Yes [provider]  silver sulfADIAZINE (SILVADENE) 1 % cream Apply 1 application topically daily as needed (wound care).   Yes [provider]  timolol (TIMOPTIC) 0.5 % ophthalmic solution Place 1 drop into both eyes at bedtime.   Yes [provider]  trolamine salicylate (ASPERCREME) 10 % cream Apply 1 application topically 3 (three) times daily. Apply to right knee and ankle for pain   Yes [provider]  mirtazapine (REMERON SOL-TAB) 15 MG disintegrating tablet Take 0.5 tablets (7.5 mg total) by mouth at bedtime. Patient not taking: Reported on 08/20/2018 12/27/17   Hosie Poisson, MD  traMADol (ULTRAM) 50 MG tablet Take 1 tablet (50 mg total) by mouth every 12 (twelve) hours. Patient not taking: Reported on 08/20/2018 05/02/18   Medina-Vargas, Jaymes Graff C, NP    Anti-infectives (From admission, onward)   Start     Dose/Rate Route Frequency Ordered Stop   08/21/18 1400  ceFEPIme (MAXIPIME) 1 g in sodium chloride 0.9 % 100 mL IVPB     1 g 200 mL/hr over 30 Minutes Intravenous Every 24 hours 08/20/18 1421     08/20/18 1417  vancomycin variable dose per unstable renal function (pharmacist dosing)      Does not apply See admin instructions 08/20/18 1419     08/20/18 1330  vancomycin (VANCOCIN) 1,500 mg in sodium chloride 0.9 % 500 mL IVPB     1,500 mg 250 mL/hr over 120 Minutes Intravenous  Once 08/20/18 1303 08/20/18 1647  08/20/18 1315  ceFEPIme (MAXIPIME) 2 g in sodium chloride 0.9 % 100 mL IVPB     2 g 200 mL/hr  over 30 Minutes Intravenous  Once 08/20/18 1308 08/20/18 1454      Scheduled Meds: . docusate sodium  100 mg Oral Daily  . enoxaparin (LOVENOX) injection  1 mg/kg Subcutaneous Q24H  . FLUoxetine  40 mg Oral Daily  . furosemide  20 mg Oral Daily  . gabapentin  300 mg Oral TID  . insulin aspart  0-5 Units Subcutaneous QHS  . insulin aspart  0-9 Units Subcutaneous TID WC  . insulin glargine  55 Units Subcutaneous QHS  . latanoprost  1 drop Both Eyes QHS  . levETIRAcetam  500 mg Oral BID  . metoprolol tartrate  12.5 mg Oral Daily  . oxybutynin  5 mg Oral BID  . pantoprazole  40 mg Oral Daily  . polyethylene glycol  17 g Oral Daily  . rOPINIRole  0.5 mg Oral QHS  . senna  2 tablet Oral QHS  . timolol  1 drop Both Eyes QHS  . vancomycin variable dose per unstable renal function (pharmacist dosing)   Does not apply See admin instructions   Continuous Infusions: . sodium chloride    . ceFEPime (MAXIPIME) IV     PRN Meds:.docusate sodium, morphine injection, nitroGLYCERIN, oxyCODONE-acetaminophen  Allergies  Allergen Reactions  . Amlodipine Cough  . Clopidogrel Bisulfate Cough  . Kenalog [Triamcinolone Acetonide] Other (See Comments)    unknown  . Lisinopril Cough  . Losartan Cough  . Sulfa Antibiotics Hives and Rash    Physical Exam: Systems review from her primary care note.  She is not able to communicate but seems to be in some pain may have remove the foot or leg.  Vitals  Blood pressure (!) 138/56, pulse 79, temperature 98.4 F (36.9 C), temperature source Oral, resp. rate 16, weight 65.3 kg, SpO2 94 %.  Lower Extremity exam:  Vascular: Difficult to palpate  Dermatological: Large decubitus heel ulcer with deep necrotic eschar.  Wound measures about 8 cm x 5 cm with depth to the bone based on x-ray and MRI  Neurological: Difficult to determine  Ortho: The x-rays and MRI indicate severe involvement of bone with infection and osteomyelitis along with tenosynovitis  likely secondary to infection as well as Achilles tendon.  Cellulitis is noted around the wound is not progressed too far proximally and is progressing proximally to 3 cm in the circumference of the wound.  Data Review  CBC Recent Labs  Lab 08/20/18 1307 08/21/18 0338  WBC 19.2* 15.1*  HGB 10.5* 8.7*  HCT 33.7* 27.1*  PLT 509* 389  MCV 98.0 94.8  MCH 30.5 30.4  MCHC 31.2 32.1  RDW 13.2 13.0  LYMPHSABS 1.9  --   MONOABS 1.3*  --   EOSABS 0.1  --   BASOSABS 0.1  --    ------------------------------------------------------------------------------------------------------------------  Chemistries  Recent Labs  Lab 08/20/18 1307 08/21/18 0338  NA 137 135  K 4.0 3.5  CL 100 104  CO2 25 23  GLUCOSE 86 187*  BUN 41* 27*  CREATININE 1.24* 0.83  CALCIUM 9.0 8.1*  AST 22  --   ALT 20  --   ALKPHOS 106  --   BILITOT 0.3  --    ------------------------------------------------------------------------------------------------------------------ estimated creatinine clearance is 42 mL/min (by C-G formula based on SCr of 0.83 mg/dL). ------------------------------------------------------------------------------------------------------------------ No results for input(s): TSH, T4TOTAL, T3FREE, THYROIDAB in the last 72 hours.  Invalid input(s): FREET3 Urinalysis    Component Value Date/Time   COLORURINE YELLOW 12/22/2017 1530   APPEARANCEUR CLOUDY (A) 12/22/2017 1530   APPEARANCEUR Cloudy (A) 06/04/2017 1507   LABSPEC 1.011 12/22/2017 1530   PHURINE 9.0 (H) 12/22/2017 1530   GLUCOSEU NEGATIVE 12/22/2017 1530   GLUCOSEU NEGATIVE 08/19/2015 1630   HGBUR NEGATIVE 12/22/2017 [place] 12/22/2017 1530   BILIRUBINUR Negative 06/04/2017 [place] 12/22/2017 1530   PROTEINUR NEGATIVE 12/22/2017 1530   UROBILINOGEN 0.2 04/12/2017 1355   UROBILINOGEN 0.2 08/19/2015 1630   NITRITE NEGATIVE 12/22/2017 1530   LEUKOCYTESUR NEGATIVE 12/22/2017 1530    LEUKOCYTESUR 3+ (A) 06/04/2017 1507     Imaging results:   Mr Heel Right Wo Contrast  Result Date: 08/20/2018 CLINICAL DATA:  Large heel ulcer. EXAM: MR OF THE RIGHT HEEL WITHOUT CONTRAST TECHNIQUE: Multiplanar, multisequence MR imaging of the right foot was performed. No intravenous contrast was administered. COMPARISON:  Radiographs 08/20/2018 FINDINGS: There is a large heel ulcer right down to the bone with underlying osteomyelitis involving the posterior third of the calcaneus. There is also likely septic retrocalcaneal bursitis and the Achilles tendon is partially detached from the calcaneus and surrounded by fluid/pus. Signal abnormality in the tendon could be septic tendinitis. No discrete drainable fluid collection/abscess. Diffuse myositis involving the short flexor muscles of the foot but no definite findings for pyomyositis. The tibiotalar joint demonstrates a small effusion but the cartilage is intact and the subtalar joints are intact. Sinus tarsi is normal. IMPRESSION: 1. Large and deep heel ulcer extending right down to the bone with surrounding granulation tissue and cellulitis. 2. Myofasciitis involving the short flexor muscles but no definite findings for pyomyositis. 3. Extensive osteomyelitis involving the posterior third of the calcaneus. 4. The Achilles tendon is partially torn from its calcaneal attachment site and there could be septic tendinitis and septic retrocalcaneal bursitis. Electronically Signed   By: Marijo Sanes M.D.   On: 08/20/2018 20:47   Dg Chest Portable 1 View  Result Date: 08/20/2018 CLINICAL DATA:  Shortness of breath. EXAM: PORTABLE CHEST 1 VIEW COMPARISON:  Radiograph of December 24, 2017. FINDINGS: The heart size and mediastinal contours are within normal limits. Both lungs are clear. No pneumothorax or pleural effusion is noted. The visualized skeletal structures are unremarkable. IMPRESSION: No active disease. Electronically Signed   By: Marijo Conception M.D.    On: 08/20/2018 14:52   Dg Foot Complete Right  Result Date: 08/20/2018 CLINICAL DATA:  Pain right posterior heel. Open wound. History of gangrene. History of diabetes. EXAM: RIGHT FOOT COMPLETE - 3+ VIEW COMPARISON:  07/21/2017. FINDINGS: Overlying bandage is present making evaluation difficult. Diffuse osteopenia degenerative change. Soft tissue ulceration noted over the calcaneus. Underlying subtle calcaneal erosions may be present. Osteomyelitis cannot be excluded. MRI can be obtained to further evaluate as needed. IMPRESSION: Soft tissue ulceration noted over the calcaneus. Underlying subtle calcaneal erosions may be present. Osteomyelitis cannot be excluded. MRI can be obtained for further evaluation AS needed. Electronically Signed   By: Marcello Moores  Register   On: 08/20/2018 13:58    Assessment & Plan: Patient has a severe infection in the right foot with osteomyelitis and a large heel ulceration.  I have asked vascular surgery to take a look at her and assess her limb salvage ability regarding that.  To resolve the wound on this area with wire debridement of a large swath of skin on the back of her heel along with  resection of at least the posterior half to two thirds of the calcaneus and then she have to have a wound VAC on for prolonged period time once infection was stabilized.  It may be better to consider a below-knee amputation on her at this timeframe.  She would likely heal better from it and have resolution to a lot of her pain.  Active Problems:   Osteomyelitis [facility])   Family Communication: I will discuss this with her daughter at first opportunity.  Also I will see what vascular has to say.  Albertine Patricia M.D on 08/21/2018 at 7:43 AM  Thank you for the consult, we will follow the patient with you in the Hospital.

## 2018-08-21 NOTE — Consult Note (Signed)
Pharmacy Antibiotic Note  Abigail Boyd is a 85 y.o. female admitted on 08/20/2018 with Osteomyelitis.  Pharmacy has been consulted for Vancomycin and Cefepime dosing.    Plan: 1) Change order to Cefepime 2 g IV q12h based on current improved renal function  2) Loading dose of Vancomycin 1500 mg x1 dose given 6/23 at 1448.  SCr improved 1.24>0.83 Will order Vancomycin 750 mg IV Q 24 hrs. Goal AUC 400-550. Expected AUC: 406 SCr used: 0.83 Cmin 10.6  Will need to continue to monitor renal function. Scr ordered for AM.      Weight: 144 lb (65.3 kg)  Temp (24hrs), Avg:98.2 F (36.8 C), Min:97.8 F (36.6 C), Max:98.4 F (36.9 C)  Recent Labs  Lab 08/20/18 1307 08/20/18 1547 08/21/18 0338  WBC 19.2*  --  15.1*  CREATININE 1.24*  --  0.83  LATICACIDVEN 3.0* 1.5  --     Estimated Creatinine Clearance: 42 mL/min (by C-G formula based on SCr of 0.83 mg/dL).    Allergies  Allergen Reactions  . Amlodipine Cough  . Clopidogrel Bisulfate Cough  . Kenalog [Triamcinolone Acetonide] Other (See Comments)    unknown  . Lisinopril Cough  . Losartan Cough  . Sulfa Antibiotics Hives and Rash    Antimicrobials this admission: 6/23 Cefepime >> 6/23 Vancomycin >>   Dose adjustments this admission: N/A  Microbiology results: 6/23 BCx: pending Wound Cx ordered   Thank you for allowing pharmacy to be a part of this patient's care.  Rocky Morel 08/21/2018 12:05 PM

## 2018-08-21 NOTE — TOC Initial Note (Signed)
Transition of Care [facility]) - Initial/Assessment Note    Patient Details  Name: Abigail Boyd MRN: [id number] Date of Birth: [birth year]  Transition of Care [facility]) CM/SW Contact:    Alzena Gerber, Lenice Llamas Phone Number: [phone] 08/21/2018, 3:05 PM  Clinical Narrative:  Per chart patient is from [facility]. Clinical Education officer, museum (CSW) contacted [employer] at [facility] to get additional information. Per Magda Paganini patient is a long term care resident on the SNF side at [facility]. Per Magda Paganini patient can return when medically stable. CSW attempted to contact patient's daughter [name] however she did not answer and a voicemail was left. FL2 complete. CSW will continue to follow and assist as needed.                     Expected Discharge Plan: Skilled Nursing Facility Barriers to Discharge: Continued Medical Work up   Patient Goals and CMS Choice        Expected Discharge Plan and Services Expected Discharge Plan: [place] In-house Referral: Clinical Social Work Discharge Planning Services: CM Consult   Living arrangements for the past 2 months: [place]                                      Prior Living Arrangements/Services Living arrangements for the past 2 months: [place] Lives with:: Facility Resident Patient language and need for interpreter reviewed:: No        Need for Family Participation in Patient Care: Yes (Comment) Care giver support system in place?: Yes (comment)   Criminal Activity/Legal Involvement Pertinent to Current Situation/Hospitalization: No - Comment as needed  Activities of Daily Living Home Assistive Devices/Equipment: Other (Comment)(howyer lift) ADL Screening (condition at time of admission) Patient's cognitive ability adequate to safely complete daily activities?: Yes Is the patient deaf or have difficulty hearing?: Yes Does the patient have difficulty  seeing, even when wearing glasses/contacts?: No Does the patient have difficulty concentrating, remembering, or making decisions?: Yes Patient able to express need for assistance with ADLs?: No Does the patient have difficulty dressing or bathing?: Yes Independently performs ADLs?: Yes (appropriate for developmental age) Does the patient have difficulty walking or climbing stairs?: Yes Weakness of Legs: Both Weakness of Arms/Hands: None  Permission Sought/Granted                  Emotional Assessment Appearance:: Appears stated age   Affect (typically observed): Other (comment)(Confused) Orientation: : Oriented to Self, Fluctuating Orientation (Suspected and/or reported [place]) Alcohol / Substance Use: Not Applicable Psych Involvement: No (comment)  Admission diagnosis:  Sepsis due to cellulitis ([place]) [L03.90, A41.9] Osteomyelitis of right foot, unspecified type [facility]) [M86.9] Patient Active Problem List   Diagnosis Date Noted  . Osteomyelitis ([place]) 08/20/2018  . Debility 02/22/2018  . Palliative care encounter 02/22/2018  . Memory loss 02/22/2018  . Somnolence 01/19/2018  . Palliative care by specialist   . Altered mental status   . Dysphagia   . Seizure disorder ([place]) 12/21/2017  . Chronic bilateral low back pain with right-sided sciatica 10/15/2017  . Hypertensive heart disease with congestive heart failure and stage 2 kidney disease ([place]) 09/06/2017  . Diabetes mellitus type 2 with neurological manifestations ([place]) 09/06/2017  . Diabetic neuropathy ([place]) 09/06/2017  . Chronic glaucoma 09/06/2017  . Acute CVA (cerebrovascular accident) ([place]) 07/23/2017  . Chronic diastolic CHF (congestive heart  failure) ([place]) 07/21/2017  . Chronic cerebrovascular accident (CVA) 06/27/2017  . Generalized weakness 04/23/2017  . Recurrent UTI 04/23/2017  . Anxiety 12/13/2015  . Allergic rhinitis 12/13/2015  . Urge incontinence 03/15/2015  . Constipation 03/15/2015  . Right knee  pain 03/15/2015  . CKD stage 2 due to type 2 diabetes mellitus ([place])   . Goals of care, counseling/discussion 06/10/2014  . PLMD (periodic limb movement disorder) 06/12/2013  . Obstructive sleep apnea 03/05/2013  . Tachy-brady syndrome: Rates range from 50-169 bpm in A. fib 12/20/2012  . History of syncope: Unclear etiology 12/19/2012  . PAF (paroxysmal atrial fibrillation) (HCC);  CHA2DS2-VASc Score =6 on 15 mg Xarelto 05/16/2012    Class: Diagnosis of  . Long term current use of anticoagulant therapy 05/16/2012  . CAD-PCI OM1, 100% RCA (L-R colllaterals) 07/07/2011  . Essential hypertension     Class: Diagnosis of  . Dyslipidemia associated with type 2 diabetes mellitus (HCC)     Class: Diagnosis of  . Diverticulitis 03/21/2011  . GERD (gastroesophageal reflux disease) 03/21/2011  . Goiter 03/21/2011  . History of carpal tunnel syndrome 03/21/2011  . Osteopenia 03/21/2011  . Vitamin D deficiency disease 03/21/2011   PCP:  Kirk Ruths, MD Pharmacy:   [facility], [place], [place] [address] [place] [address] Phone: [phone] Fax: [phone]  [facility] - [place], [place] - [address] [place] [address] Phone: [phone] Fax: [phone]     Social Determinants of Health (SDOH) Interventions    Readmission Risk Interventions No flowsheet data found.

## 2018-08-21 NOTE — NC FL2 (Addendum)
[place] LEVEL OF CARE SCREENING TOOL     IDENTIFICATION  Patient Name: Abigail Boyd Birthdate: [birth year] Sex: female Admission Date (Current Location): 08/20/2018  [place] and [place] Number:  Selena Lesser [id number] [place] and Address:  [facility], [address] [place], [place] [address]      Provider Number: [id number]  Attending Physician Name and Address:  [name], *  Relative Name and Phone Number:       Current Level of Care: Hospital Recommended Level of Care: [place] Prior Approval Number:    Date Approved/Denied:   PASRR Number: [id number]  Discharge Plan: SNF    Current Diagnoses: Patient Active Problem List   Diagnosis Date Noted  . Osteomyelitis ([place]) 08/20/2018  . Debility 02/22/2018  . Palliative care encounter 02/22/2018  . Memory loss 02/22/2018  . Somnolence 01/19/2018  . Palliative care by specialist   . Altered mental status   . Dysphagia   . Seizure disorder ([place]) 12/21/2017  . Chronic bilateral low back pain with right-sided sciatica 10/15/2017  . Hypertensive heart disease with congestive heart failure and stage 2 kidney disease ([place]) 09/06/2017  . Diabetes mellitus type 2 with neurological manifestations ([place]) 09/06/2017  . Diabetic neuropathy ([place]) 09/06/2017  . Chronic glaucoma 09/06/2017  . Acute CVA (cerebrovascular accident) ([place]) 07/23/2017  . Chronic diastolic CHF (congestive heart failure) ([place]) 07/21/2017  . Chronic cerebrovascular accident (CVA) 06/27/2017  . Generalized weakness 04/23/2017  . Recurrent UTI 04/23/2017  . Anxiety 12/13/2015  . Allergic rhinitis 12/13/2015  . Urge incontinence 03/15/2015  . Constipation 03/15/2015  . Right knee pain 03/15/2015  . CKD stage 2 due to type 2 diabetes mellitus ([place])   . Goals of care, counseling/discussion 06/10/2014  . PLMD (periodic limb movement disorder) 06/12/2013  . Obstructive sleep apnea  03/05/2013  . Tachy-brady syndrome: Rates range from 50-169 bpm in A. fib 12/20/2012  . History of syncope: Unclear etiology 12/19/2012  . PAF (paroxysmal atrial fibrillation) (HCC);  CHA2DS2-VASc Score =6 on 15 mg Xarelto 05/16/2012    Class: Diagnosis of  . Long term current use of anticoagulant therapy 05/16/2012  . CAD-PCI OM1, 100% RCA (L-R colllaterals) 07/07/2011  . Essential hypertension     Class: Diagnosis of  . Dyslipidemia associated with type 2 diabetes mellitus (HCC)     Class: Diagnosis of  . Diverticulitis 03/21/2011  . GERD (gastroesophageal reflux disease) 03/21/2011  . Goiter 03/21/2011  . History of carpal tunnel syndrome 03/21/2011  . Osteopenia 03/21/2011  . Vitamin D deficiency disease 03/21/2011    Orientation RESPIRATION BLADDER Height & Weight     Self  Normal Incontinent Weight: 144 lb (65.3 kg) Height:     BEHAVIORAL SYMPTOMS/MOOD NEUROLOGICAL BOWEL NUTRITION STATUS      Continent Diet(Diet: Heart Healthy/ Carb Modified.)  AMBULATORY STATUS COMMUNICATION OF NEEDS Skin   Extensive Assist Verbally Other (Comment)(Right Foot Wound)                       Personal Care Assistance Level of Assistance  Bathing, Feeding, Dressing Bathing Assistance: Limited assistance Feeding assistance: Limited assistance Dressing Assistance: Limited assistance     Functional Limitations Info  Sight, Hearing, Speech Sight Info: Adequate Hearing Info: Adequate Speech Info: Adequate    SPECIAL CARE FACTORS FREQUENCY   IV ABX                     Contractures  Additional Factors Info  Code Status, Allergies Code Status Info: DNR Allergies Info: Amlodipine, Clopidogrel Bisulfate, Kenalog, Triamcinolone Acetonide, Lisinopril, Losartan, Sulfa Antibiotics           Current Medications (08/21/2018):  This is the current hospital active medication list Current Facility-Administered Medications  Medication Dose Route Frequency Provider Last Rate  Last Dose  . 0.9 %  sodium chloride infusion   Intravenous Once Duffy Bruce, MD      . ceFEPIme (MAXIPIME) 2 g in sodium chloride 0.9 % 100 mL IVPB  2 g Intravenous Q12H Rocky Morel, RPH 200 mL/hr at 08/21/18 1247 2 g at 08/21/18 1247  . docusate sodium (COLACE) capsule 100 mg  100 mg Oral Daily [name], MD   100 mg at 08/21/18 0847  . docusate sodium (COLACE) capsule 100 mg  100 mg Oral BID PRN [name], MD      . enoxaparin (LOVENOX) injection 65 mg  1 mg/kg Subcutaneous Q12H Rocky Morel, RPH   65 mg at 08/21/18 1246  . FLUoxetine (PROZAC) capsule 40 mg  40 mg Oral Daily [name], MD   40 mg at 08/21/18 0845  . furosemide (LASIX) tablet 20 mg  20 mg Oral Daily [name], MD   20 mg at 08/21/18 0846  . gabapentin (NEURONTIN) capsule 300 mg  300 mg Oral TID [name], MD   300 mg at 08/21/18 0847  . insulin aspart (novoLOG) injection 0-5 Units  0-5 Units Subcutaneous QHS [name], MD      . insulin aspart (novoLOG) injection 0-9 Units  0-9 Units Subcutaneous TID WC [name], MD   1 Units at 08/21/18 1245  . insulin glargine (LANTUS) injection 55 Units  55 Units Subcutaneous QHS [name], MD   55 Units at 08/20/18 2308  . latanoprost (XALATAN) 0.005 % ophthalmic solution 1 drop  1 drop Both Eyes QHS [name], MD   1 drop at 08/20/18 2307  . levETIRAcetam (KEPPRA) tablet 500 mg  500 mg Oral BID [name], MD   500 mg at 08/21/18 0845  . metoprolol tartrate (LOPRESSOR) tablet 12.5 mg  12.5 mg Oral Daily [name], MD   12.5 mg at 08/21/18 0846  . morphine 2 MG/ML injection 1 mg  1 mg Intravenous Q4H PRN [name], MD      . nitroGLYCERIN (NITROSTAT) SL tablet 0.4 mg  0.4 mg Sublingual Q5 min PRN [name], MD      . oxybutynin (DITROPAN) tablet 5 mg  5 mg Oral BID [name], MD   5 mg at 08/21/18 0845  .  oxyCODONE-acetaminophen (PERCOCET/ROXICET) 5-325 MG per tablet 1 tablet  1 tablet Oral Q6H PRN [name], MD   1 tablet at 08/21/18 0846  . pantoprazole (PROTONIX) EC tablet 40 mg  40 mg Oral Daily [name], MD   40 mg at 08/21/18 0846  . polyethylene glycol (MIRALAX / GLYCOLAX) packet 17 g  17 g Oral Daily [name], MD      . rOPINIRole (REQUIP) tablet 0.5 mg  0.5 mg Oral QHS [name], MD   0.5 mg at 08/20/18 2306  . senna (SENOKOT) tablet 17.2 mg  2 tablet Oral QHS [name], MD   17.2 mg at 08/20/18 2306  . timolol (TIMOPTIC) 0.5 % ophthalmic solution 1 drop  1 drop Both Eyes QHS [name], MD   1 drop at 08/20/18 2307  . vancomycin (VANCOCIN) IVPB 750 mg/150 ml premix  750  mg Intravenous Q24H Rocky Morel, [facility]         Discharge Medications: Please see discharge summary for a list of discharge medications.  Relevant Imaging Results:  Relevant Lab Results:   Additional Information SSN: [id number]  Esti Demello, Veronia Beets, LCSW

## 2018-08-21 NOTE — Progress Notes (Signed)
[facility] at [place] NAME: Abigail Boyd    MR#:  [id number]  DATE OF BIRTH:  1929-12-15  SUBJECTIVE:  CHIEF COMPLAINT:   Chief Complaint  Patient presents with  . Wound Infection   Sent from long-term care facility with wound on right ankle which looks like osteomyelitis. Patient still have pain and has some confusion.  She denies any other complaints. REVIEW OF SYSTEMS:  CONSTITUTIONAL: No fever, fatigue or weakness.  EYES: No blurred or double vision.  EARS, NOSE, AND THROAT: No tinnitus or ear pain.  RESPIRATORY: No cough, shortness of breath, wheezing or hemoptysis.  CARDIOVASCULAR: No chest pain, orthopnea, edema.  GASTROINTESTINAL: No nausea, vomiting, diarrhea or abdominal pain.  GENITOURINARY: No dysuria, hematuria.  ENDOCRINE: No polyuria, nocturia,  HEMATOLOGY: No anemia, easy bruising or bleeding SKIN: No rash or lesion. MUSCULOSKELETAL: Right foot pain.Marland Kitchen   NEUROLOGIC: No tingling, numbness, weakness.  PSYCHIATRY: No anxiety or depression.   ROS  DRUG ALLERGIES:   Allergies  Allergen Reactions  . Amlodipine Cough  . Clopidogrel Bisulfate Cough  . Kenalog [Triamcinolone Acetonide] Other (See Comments)    unknown  . Lisinopril Cough  . Losartan Cough  . Sulfa Antibiotics Hives and Rash    VITALS:  Blood pressure (!) 143/51, pulse 82, temperature 97.8 F (36.6 C), temperature source Oral, resp. rate 17, weight 65.3 kg, SpO2 97 %.  PHYSICAL EXAMINATION:   GENERAL:  85 y.o.-year-old patient lying in the bed with no acute distress.  Slightly lethargic. EYES: Pupils equal, round, reactive to light and accommodation. No scleral icterus. Extraocular muscles intact.  HEENT: Head atraumatic, normocephalic. Oropharynx and nasopharynx clear.  NECK:  Supple, no jugular venous distention. No thyroid enlargement, no tenderness.  LUNGS: Normal breath sounds bilaterally, no wheezing, rales,rhonchi or crepitation. No use of  accessory muscles of respiration.  CARDIOVASCULAR: S1, S2 normal. No murmurs, rubs, or gallops.  ABDOMEN: Soft, nontender, nondistended. Bowel sounds present. No organomegaly or mass.  EXTREMITIES: No pedal edema, cyanosis, or clubbing.  Right heel has a bleeding ulcer with some surrounding redness. NEUROLOGIC: Cranial nerves II through XII are intact. Muscle strength 2-3/5 in all extremities. Sensation intact. Gait not checked.  PSYCHIATRIC: The patient is alert and oriented x 3.  SKIN: No obvious rash, lesion, or ulcer.   Physical Exam LABORATORY PANEL:   CBC Recent Labs  Lab 08/21/18 0338  WBC 15.1*  HGB 8.7*  HCT 27.1*  PLT 389   ------------------------------------------------------------------------------------------------------------------  Chemistries  Recent Labs  Lab 08/20/18 1307 08/21/18 0338  NA 137 135  K 4.0 3.5  CL 100 104  CO2 25 23  GLUCOSE 86 187*  BUN 41* 27*  CREATININE 1.24* 0.83  CALCIUM 9.0 8.1*  AST 22  --   ALT 20  --   ALKPHOS 106  --   BILITOT 0.3  --    ------------------------------------------------------------------------------------------------------------------  Cardiac Enzymes No results for input(s): TROPONINI in the last 168 hours. ------------------------------------------------------------------------------------------------------------------  RADIOLOGY:  Mr Heel Right Wo Contrast  Result Date: 08/20/2018 CLINICAL DATA:  Large heel ulcer. EXAM: MR OF THE RIGHT HEEL WITHOUT CONTRAST TECHNIQUE: Multiplanar, multisequence MR imaging of the right foot was performed. No intravenous contrast was administered. COMPARISON:  Radiographs 08/20/2018 FINDINGS: There is a large heel ulcer right down to the bone with underlying osteomyelitis involving the posterior third of the calcaneus. There is also likely septic retrocalcaneal bursitis and the Achilles tendon is partially detached from the calcaneus and surrounded by fluid/pus.  Signal  abnormality in the tendon could be septic tendinitis. No discrete drainable fluid collection/abscess. Diffuse myositis involving the short flexor muscles of the foot but no definite findings for pyomyositis. The tibiotalar joint demonstrates a small effusion but the cartilage is intact and the subtalar joints are intact. Sinus tarsi is normal. IMPRESSION: 1. Large and deep heel ulcer extending right down to the bone with surrounding granulation tissue and cellulitis. 2. Myofasciitis involving the short flexor muscles but no definite findings for pyomyositis. 3. Extensive osteomyelitis involving the posterior third of the calcaneus. 4. The Achilles tendon is partially torn from its calcaneal attachment site and there could be septic tendinitis and septic retrocalcaneal bursitis. Electronically Signed   By: Marijo Sanes M.D.   On: 08/20/2018 20:47   Dg Chest Portable 1 View  Result Date: 08/20/2018 CLINICAL DATA:  Shortness of breath. EXAM: PORTABLE CHEST 1 VIEW COMPARISON:  Radiograph of December 24, 2017. FINDINGS: The heart size and mediastinal contours are within normal limits. Both lungs are clear. No pneumothorax or pleural effusion is noted. The visualized skeletal structures are unremarkable. IMPRESSION: No active disease. Electronically Signed   By: Marijo Conception M.D.   On: 08/20/2018 14:52   Dg Foot Complete Right  Result Date: 08/20/2018 CLINICAL DATA:  Pain right posterior heel. Open wound. History of gangrene. History of diabetes. EXAM: RIGHT FOOT COMPLETE - 3+ VIEW COMPARISON:  07/21/2017. FINDINGS: Overlying bandage is present making evaluation difficult. Diffuse osteopenia degenerative change. Soft tissue ulceration noted over the calcaneus. Underlying subtle calcaneal erosions may be present. Osteomyelitis cannot be excluded. MRI can be obtained to further evaluate as needed. IMPRESSION: Soft tissue ulceration noted over the calcaneus. Underlying subtle calcaneal erosions may be present.  Osteomyelitis cannot be excluded. MRI can be obtained for further evaluation AS needed. Electronically Signed   By: [place]   On: 08/20/2018 13:58    ASSESSMENT AND PLAN:   Active Problems:   Osteomyelitis ([place])   *Diabetic ulcer on right heel  osteomyelitis  Broad-spectrum antibiotics for now.  X-ray does not give a clear diagnosis of osteomyelitis confirmed on MRI. Podiatry consult appreciated.  Suggested to get vascular involvement. Pain management as needed. Vascular is suggesting likely above-knee amputation to avoid contracture as patient is bedbound. I tried calling patient's daughter to discuss this treatment plan but she did not pick up the phone.  *Diabetes mellitus with CKD stage III We will continue basal insulin and keep on sliding scale coverage. Avoid nephrotoxic medication and monitor renal function closely.  *Paroxysmal atrial fibrillation She is on beta-blocker and Xarelto. Continue rate control but hold Xarelto in anticipation of procedure by podiatry. She is taking dose every evening so likely she might have taken last dose last night.  Pharmacy to confirm the last dose. We will give her Lovenox therapeutic dose to cover while we are holding Xarelto.  *Coronary artery disease status post stent Continue cardiac meds.  *Essential hypertension Continue home medications, controlled well.  *History of stroke Long-term resident at nursing facility Patient is bedbound at baseline.    All the records are reviewed and case discussed with Care Management/Social Workerr. Management plans discussed with the patient, family and they are in agreement.  CODE STATUS: DNR.  TOTAL TIME TAKING CARE OF THIS PATIENT: 45 minutes.     POSSIBLE D/C IN 2-3 DAYS, DEPENDING ON CLINICAL CONDITION.   [name] M.D on 08/21/2018   Between 7am to 6pm - Pager - [phone]  After 6pm go  to [email or web address] - password [email or web address]  [facility]ospital[facility]  Office  [phone]  CC: Primary care physician; Kirk Ruths, MD  Note: This dictation was prepared with Dragon dictation along with smaller phrase technology. Any transcriptional errors that result from this process are unintentional.

## 2018-08-22 ENCOUNTER — Other Ambulatory Visit (INDEPENDENT_AMBULATORY_CARE_PROVIDER_SITE_OTHER): Payer: Self-pay | Admitting: Vascular Surgery

## 2018-08-22 LAB — CREATININE, SERUM
Creatinine, Ser: 0.89 mg/dL (ref 0.44–1.00)
GFR calc Af Amer: >60 mL/min (ref >60)
GFR calc non Af Amer: 59 mL/min — ABNORMAL LOW (ref >60)

## 2018-08-22 LAB — GLUCOSE, CAPILLARY
Glucose-Capillary: 75 mg/dL (ref 70–99)
Glucose-Capillary: 94 mg/dL (ref 70–99)
Glucose-Capillary: 117 mg/dL — ABNORMAL HIGH (ref 70–99)
Glucose-Capillary: 65 mg/dL — ABNORMAL LOW (ref 70–99)
Glucose-Capillary: 78 mg/dL (ref 70–99)

## 2018-08-22 MED ORDER — SODIUM CHLORIDE 0.9 % IV SOLN
INTRAVENOUS | Status: DC
  Administered 2018-08-23 – 2018-08-28 (×8): via INTRAVENOUS

## 2018-08-22 MED ORDER — CEFAZOLIN SODIUM-DEXTROSE 2-4 GM/100ML-% IV SOLN
2.0000 g | INTRAVENOUS | Status: AC
  Administered 2018-08-23: 2 g via INTRAVENOUS
  Filled 2018-08-22: qty 100

## 2018-08-22 MED ORDER — ONDANSETRON HCL 4 MG/2ML IJ SOLN
4.0000 mg | Freq: Four times a day (QID) | INTRAMUSCULAR | Status: DC | PRN
  Administered 2018-08-22: 4 mg via INTRAVENOUS
  Filled 2018-08-22: qty 2

## 2018-08-22 NOTE — Progress Notes (Signed)
[facility] Daily Progress Note    Subjective: Patient more alert today. Able to communicate she "doesn't feel good".  No issues overnight.  Objective: Vitals:   08/21/18 0743 08/21/18 2059 08/22/18 0417 08/22/18 0745  BP: (!) 143/51 (!) 114/56 (!) 143/63 (!) 143/66  Pulse: 82 79 70 73  Resp: 17 16 16    Temp: 97.8 F (36.6 C) 97.7 F (36.5 C) 98.7 F (37.1 C) 98 F (36.7 C)  TempSrc: Oral Oral Oral Oral  SpO2: 97% 99% 96% 96%  Weight:        Intake/Output Summary (Last 24 hours) at 08/22/2018 1045 Last data filed at 08/22/2018 0946 Gross per 24 hour  Intake 16.32 ml  Output 1200 ml  Net -1183.68 ml   Physical Exam: A&Ox3, NAD CV: RRR Pulmonary: CTA Bilaterally Abdomen: Soft, Nontender, Nondistended Vascular:  Thigh soft, calf soft. Extremity warm distally to toes. Unable to palpate pedal pulses. Large heel ulceration with eschar.    Laboratory: CBC    Component Value Date/Time   WBC 15.1 (H) 08/21/2018 0338   HGB 8.7 (L) 08/21/2018 0338   HGB 11.0 (L) 01/27/2015 1609   HCT 27.1 (L) 08/21/2018 0338   HCT 36.2 07/22/2017 0026   PLT 389 08/21/2018 0338   PLT 262 01/27/2015 1609   BMET    Component Value Date/Time   NA 135 08/21/2018 0338   NA 140 04/22/2015 1451   K 3.5 08/21/2018 0338   CL 104 08/21/2018 0338   CO2 23 08/21/2018 0338   GLUCOSE 187 (H) 08/21/2018 0338   BUN 27 (H) 08/21/2018 0338   BUN 26 04/22/2015 1451   CREATININE 0.89 08/22/2018 0550   CALCIUM 8.1 (L) 08/21/2018 0338   GFRNONAA 59 (L) 08/22/2018 0550   GFRAA >60 08/22/2018 0550   Assessment/Planning: The patient is an 85 year old bed-bound female resident at [facility] home with multiple medical issues who is nonverbal due to previous CVA.  The patient was sent to the [facility] emergency department yesterday evening at the recommendation of the [facility]'s wound center with a chief complaint of  worsening right lower extremity foot wounds. 1) we will continue to move forward with a right lower extremity angiogram with possible intervention tomorrow with Dr. Delana Meyer.  This will give [place] the opportunity to assess the patient's anatomy and degree of contributing atherosclerotic disease.  If appropriate an attempt to revascularize the leg can be made at that time.   2) will pre-op  Discussed with Dr. Eber Hong [facility] PA-C 08/22/2018 10:45 AM

## 2018-08-22 NOTE — Progress Notes (Signed)
Referral received for goals of care discussion. Patient assessed at the bedside. Voicemail left for daughter on her listed home and mobile number. Will await return call and also re-attempt later to connect with family. Patient is awake but confused. Unable to engage in goals of care appropriately.   Thank you for your referral.   Alda Lea, AGPCNP-BC Palliative Medicine Team  Phone: [phone] Pager: [phone] Amion: [place]

## 2018-08-22 NOTE — Progress Notes (Signed)
[facility] NAME: Abigail Boyd    MR#:  [id number]  DATE OF BIRTH:  December 11, 1929  SUBJECTIVE:  CHIEF COMPLAINT:   Chief Complaint  Patient presents with  . Wound Infection   Sent from long-term care facility with wound on right ankle which looks like osteomyelitis. Patient still have pain and has some confusion.  She denies any other complaints.  REVIEW OF SYSTEMS:  CONSTITUTIONAL: No fever, fatigue or weakness.  EYES: No blurred or double vision.  EARS, NOSE, AND THROAT: No tinnitus or ear pain.  RESPIRATORY: No cough, shortness of breath, wheezing or hemoptysis.  CARDIOVASCULAR: No chest pain, orthopnea, edema.  GASTROINTESTINAL: No nausea, vomiting, diarrhea or abdominal pain.  GENITOURINARY: No dysuria, hematuria.  ENDOCRINE: No polyuria, nocturia,  HEMATOLOGY: No anemia, easy bruising or bleeding SKIN: No rash or lesion. MUSCULOSKELETAL: Right foot pain.Marland Kitchen   NEUROLOGIC: No tingling, numbness, weakness.  PSYCHIATRY: No anxiety or depression.   ROS  DRUG ALLERGIES:   Allergies  Allergen Reactions  . Amlodipine Cough  . Clopidogrel Bisulfate Cough  . Kenalog [Triamcinolone Acetonide] Other (See Comments)    unknown  . Lisinopril Cough  . Losartan Cough  . Sulfa Antibiotics Hives and Rash    VITALS:  Blood pressure (!) 143/66, pulse 73, temperature 98 F (36.7 C), temperature source Oral, resp. rate 16, height 5\' 4"  (1.626 m), weight 65.3 kg, SpO2 96 %.  PHYSICAL EXAMINATION:   GENERAL:  85 y.o.-year-old patient lying in the bed with no acute distress.  Slightly lethargic. EYES: Pupils equal, round, reactive to light and accommodation. No scleral icterus. Extraocular muscles intact.  HEENT: Head atraumatic, normocephalic. Oropharynx and nasopharynx clear.  NECK:  Supple, no jugular venous distention. No thyroid enlargement, no tenderness.  LUNGS: Normal breath sounds bilaterally, no wheezing, rales,rhonchi or  crepitation. No use of accessory muscles of respiration.  CARDIOVASCULAR: S1, S2 normal. No murmurs, rubs, or gallops.  ABDOMEN: Soft, nontender, nondistended. Bowel sounds present. No organomegaly or mass.  EXTREMITIES: No pedal edema, cyanosis, or clubbing.  Right heel has a bleeding ulcer with some surrounding redness.    NEUROLOGIC: Cranial nerves II through XII are intact. Muscle strength 2-3/5 in all extremities. Sensation intact. Gait not checked.  PSYCHIATRIC: The patient is alert and oriented x 3.  SKIN: No obvious rash, lesion, or ulcer.   Physical Exam LABORATORY PANEL:   CBC Recent Labs  Lab 08/21/18 0338  WBC 15.1*  HGB 8.7*  HCT 27.1*  PLT 389   ------------------------------------------------------------------------------------------------------------------  Chemistries  Recent Labs  Lab 08/20/18 1307 08/21/18 0338 08/22/18 0550  NA 137 135  --   K 4.0 3.5  --   CL 100 104  --   CO2 25 23  --   GLUCOSE 86 187*  --   BUN 41* 27*  --   CREATININE 1.24* 0.83 0.89  CALCIUM 9.0 8.1*  --   AST 22  --   --   ALT 20  --   --   ALKPHOS 106  --   --   BILITOT 0.3  --   --    ------------------------------------------------------------------------------------------------------------------  Cardiac Enzymes No results for input(s): TROPONINI in the last 168 hours. ------------------------------------------------------------------------------------------------------------------  RADIOLOGY:  Mr Heel Right Wo Contrast  Result Date: 08/20/2018 CLINICAL DATA:  Large heel ulcer. EXAM: MR OF THE RIGHT HEEL WITHOUT CONTRAST TECHNIQUE: Multiplanar, multisequence MR imaging of the right foot was performed. No intravenous contrast was administered. COMPARISON:  Radiographs 08/20/2018 FINDINGS: There is a large heel ulcer right down to the bone with underlying osteomyelitis involving the posterior third of the calcaneus. There is also likely septic retrocalcaneal bursitis  and the Achilles tendon is partially detached from the calcaneus and surrounded by fluid/pus. Signal abnormality in the tendon could be septic tendinitis. No discrete drainable fluid collection/abscess. Diffuse myositis involving the short flexor muscles of the foot but no definite findings for pyomyositis. The tibiotalar joint demonstrates a small effusion but the cartilage is intact and the subtalar joints are intact. Sinus tarsi is normal. IMPRESSION: 1. Large and deep heel ulcer extending right down to the bone with surrounding granulation tissue and cellulitis. 2. Myofasciitis involving the short flexor muscles but no definite findings for pyomyositis. 3. Extensive osteomyelitis involving the posterior third of the calcaneus. 4. The Achilles tendon is partially torn from its calcaneal attachment site and there could be septic tendinitis and septic retrocalcaneal bursitis. Electronically Signed   By: Marijo Sanes M.D.   On: 08/20/2018 20:47    ASSESSMENT AND PLAN:   Active Problems:   Osteomyelitis (HCC)   *Diabetic ulcer on right heel  osteomyelitis Broad-spectrum antibiotics for now.  X-ray does not give a clear diagnosis of osteomyelitis confirmed on MRI. Podiatry consult appreciated.  Suggested to get vascular involvement. Pain management as needed. Vascular is suggesting likely above-knee amputation to avoid contracture as patient is bedbound.  Vascular is going to do angiogram tomorrow to check circulation. I tried calling patient's daughter to discuss this treatment plan but she did not pick up the phone.  *Diabetes mellitus with CKD stage III We will continue basal insulin and keep on sliding scale coverage. Avoid nephrotoxic medication and monitor renal function closely.  *Paroxysmal atrial fibrillation She is on beta-blocker and Xarelto. Continue rate control but hold Xarelto in anticipation of procedure by podiatry. She is taking dose every evening so likely she might have  taken last dose last night.  Pharmacy to confirm the last dose. We will give her Lovenox therapeutic dose to cover while we are holding Xarelto.  *Coronary artery disease status post stent Continue cardiac meds.  *Essential hypertension Continue home medications, controlled well.  *History of stroke Long-term resident at nursing facility Patient is bedbound at baseline.  Called palliative care.  All the records are reviewed and case discussed with Care Management/Social Workerr. Management plans discussed with the patient, family and they are in agreement.  CODE STATUS: DNR. I called patient daughter and left a voicemail to discuss the case.  TOTAL TIME TAKING CARE OF THIS PATIENT: 35 minutes.     POSSIBLE D/C IN 2-3 DAYS, DEPENDING ON CLINICAL CONDITION.   [name] M.D on 08/22/2018   Between 7am to 6pm - Pager - [phone]  After 6pm go to [email or web address] - password [email or web address] [facility]  Office  [phone]  CC: Primary care physician; Kirk Ruths, MD  Note: This dictation was prepared with Dragon dictation along with smaller phrase technology. Any transcriptional errors that result from this process are unintentional.

## 2018-08-22 NOTE — Progress Notes (Signed)
[facility] Podiatry                                                      Patient Demographics  Abigail Boyd, is a 85 y.o. female   MRN: [id number]   DOB - 19-Feb-1930  Admit Date - 08/20/2018    Outpatient Primary MD for the patient is Kirk Ruths, MD  Consult requested in the Hospital by [name], *, On 08/22/2018  With History of -  Past Medical History:  Diagnosis Date  . Abnormal nuclear stress test 06/21/2011   Inferolateral reversible defect;--> cardiac cath & PCI of CxOM, occluded RCA with collaterals;; followup Myoview 11/2012: Low risk. Fixed basal inferior artifact normal EF. No ischemia  . Anxiety   . Asthma   . Bilateral cataracts    Status post stroke or correction  . CAD S/P percutaneous coronary angioplasty 06/2011   s/P PCI to proximal OM1 w/ DES; occluded RCA with bridging and L-R collaterals (medical management)  . Chronic kidney disease (CKD), stage II (mild)    Related to current bladder infections (although diabetes cannot be excluded)  . Chronotropic incompetence with sinus node dysfunction [facility]) October 2013   On CPET test; beta blockers reduced  . Diabetes mellitus type 2, controlled ([place])    On oral medications  . Diverticulitis   . Essential hypertension    Allowing for permissive hypertension to avoid orthostatic hypotension  . GERD (gastroesophageal reflux disease)    On PPI  . Glaucoma   . History of syncope    Per EP - neurocardiogenic & not Bradycardia related (no PPM)  . History of unstable angina 06/13/2011   Jaw pain awakening from sleep -- Myoview --CATH --> PCI  . Hyperlipidemia with target LDL less than 70    HDL at goal, LDL not at goal, borderline triglycerides. On Crestor 20 mg  . Migraines   . OSA (obstructive sleep apnea)    hx bladder infections  . Osteoarthritis   . PAF (paroxysmal  atrial fibrillation) ([place]) 10-11/ 2014   CardioNet Event Monitor: NSR & S Brady -- Rates 50-100; Total A. fib burden 35 hours and 27 minutes. 1296 episodes, longest was 1 hour 29 minutes. Rate ranged from 52-169 beats per minute.  . Seasonal allergies   . Shortness of breath on exertion October 2013   2-D echo: Normal EF>55%, Gr 1DD, mild aortic sclerosis; Evaluated with CPET - peak VO2 97%; Chronotropic Incompetence (submaximal effort)  . Spinal stenosis of lumbar region 11/2011  . Stroke ([place]) 06/2017  . Stroke ([place]) 11/2014  . Tachycardia-bradycardia syndrome ([place]) 11/2012  . Urge incontinence       Past Surgical History:  Procedure Laterality Date  . APPENDECTOMY    . BREAST BIOPSY     both breast  . cataracts     both eyes  . COLONOSCOPY    . CORONARY ANGIOPLASTY WITH STENT PLACEMENT  07/05/2011   Promus Element DES 2.5 mm x 16 mm-post dilated to 2.65 mm CX-Proximal OM1  . Holter Monitor  04/2015   Sinus rhythm with rates 52-149 BPM. Isolated PACs with rare couplets and bigeminy. Multiple short runs of PAT/PSVT. Arrhythmia run 120 bpm for 204 beats. 14% of time was in A. fib/flutter. - Reviewed by Dr. Caryl Comes. Not thought to be significant enough for her  syncope.  Marland Kitchen KNEE ARTHROSCOPY WITH MEDIAL MENISECTOMY Right 06/24/2014   Procedure: RIGHT KNEE ARTHROSCOPY WITH partial lateral MENISECTOMY, abrasion chondroplasty of medial femoral condyle and patella, microfracture technique;  Surgeon: Latanya Maudlin, MD;  Location: [facility] ORS;  Service: Orthopedics;  Laterality: Right;  . LEFT HEART CATHETERIZATION WITH CORONARY ANGIOGRAM N/A 07/06/2011   Procedure: LEFT HEART CATHETERIZATION WITH CORONARY ANGIOGRAM;  Surgeon: Leonie Man, MD;  Location[facility] CATH LAB: Unstable Angina --> Myoview wiht Inf-Lat Ischemia.  Proximal OM1 lesion --> PCI; 100% mid RCA with right to right and left to right bridging collaterals from circumflex RPL and LAD the PDA.  Marland Kitchen NM MYOVIEW LTD  October 2014    Low risk. Fixed  basal inferior artifact normal EF. No ischemia --> (as compared to pre-PCI Myoview revealing inferolateral ischemia)  . rotator cuff Right   . TRANSTHORACIC ECHOCARDIOGRAM  11/2014   EF 65-70%, Mod LVH. Normal wall motion. Gr 1 DD. Normal valves.  Marland Kitchen VAGINAL HYSTERECTOMY      in for   Chief Complaint  Patient presents with  . Wound Infection     HPI  Abigail Boyd  is a 85 y.o. female, patient was admitted 2 days ago with a pronounced ulceration on the posterior right heel with infection and necrotic eschar.  I have asked for a vascular consult as well as my evaluation.  Social History Social History   Tobacco Use  . Smoking status: Never Smoker  . Smokeless tobacco: Never Used  Substance Use Topics  . Alcohol use: No    Family History Family History  Problem Relation Age of Onset  . COPD Mother   . Pulmonary fibrosis Mother   . Heart attack Father   . Heart disease Father   . Stroke Father   . Cancer Sister   . Cancer Sister   . Clotting disorder Sister   . Heart attack Sister   . Arthritis Other   . Breast cancer Sister   . Colon cancer Neg Hx   . Esophageal cancer Neg Hx   . Stomach cancer Neg Hx     Prior to Admission medications   Medication Sig Start Date End Date Taking? Authorizing Provider  azelastine (ASTELIN) 0.1 % nasal spray Place 2 sprays into both nostrils at bedtime. Use in each nostril as directed    Yes [provider]  docusate sodium (STOOL SOFTENER) 100 MG capsule Take 100 mg by mouth daily.    Yes [provider]  FLUoxetine (PROZAC) 40 MG capsule Take 40 mg by mouth daily.   Yes [provider]  furosemide (LASIX) 20 MG tablet Take 20 mg by mouth daily.  12/27/17  Yes [provider]  gabapentin (NEURONTIN) 300 MG capsule Take 300 mg by mouth 3 (three) times daily.    Yes [provider]  glucagon (GLUCAGON EMERGENCY) 1 MG injection Inject 1 mg into the vein once as needed. Give 1 mg IM for  Hypoglycemia (symptomatic) not responding to oral glucose, snack. May repeat x 1 after 15 minutes. USE ONLY IF PT IS OR IS BECOMING UNRESPONSIVE   Yes [provider]  HUMALOG KWIKPEN 100 UNIT/ML KiwkPen Inject 35 Units into the skin 3 (three) times daily with meals. Give with meals for cbg > = 150 02/28/18  Yes [provider]  Insulin Glargine (TOUJEO MAX SOLOSTAR) 300 UNIT/ML SOPN Inject 55 Units into the skin at bedtime. Alternate abdominal injection sites. 02/28/18  Yes [provider]  latanoprost Ivin Poot)  0.005 % ophthalmic solution Place 1 drop into both eyes at bedtime.    Yes [provider]  levETIRAcetam (KEPPRA) 500 MG tablet Take 1 tablet (500 mg total) by mouth 2 (two) times daily. 12/27/17  Yes Hosie Poisson, MD  levocetirizine (XYZAL) 5 MG tablet Take 2.5 mg by mouth daily.    Yes [provider]  Magnesium 500 MG CAPS Take 500 mg by mouth at bedtime.    Yes [provider]  metoprolol tartrate (LOPRESSOR) 25 MG tablet Take 12.5 mg by mouth daily.  01/29/18  Yes [provider]  mirtazapine (REMERON) 15 MG tablet Take 7.5 mg by mouth at bedtime.   Yes [provider]  nitroGLYCERIN (NITROSTAT) 0.4 MG SL tablet Place 0.4 mg under the tongue every 5 (five) minutes as needed for chest pain.   Yes [provider]  omeprazole (PRILOSEC) 40 MG capsule Take 40 mg by mouth daily.    Yes [provider]  oxybutynin (DITROPAN) 5 MG tablet Take 5 mg by mouth 2 (two) times daily.    Yes [provider]  oxyCODONE-acetaminophen (PERCOCET/ROXICET) 5-325 MG tablet Take 1 tablet by mouth 2 (two) times daily.   Yes [provider]  oxyCODONE-acetaminophen (PERCOCET/ROXICET) 5-325 MG tablet Take 1 tablet by mouth every 6 (six) hours as needed for moderate pain or severe pain.   Yes [provider]  polyethylene glycol (MIRALAX / GLYCOLAX) packet Give 17 gm by mouth once every morning 03/31/18   Yes [provider]  Rivaroxaban (XARELTO) 15 MG TABS tablet Take 15 mg by mouth every evening.    Yes [provider]  rOPINIRole (REQUIP) 0.5 MG tablet TAKE 1 TABLET BY MOUTH AT  BEDTIME Patient taking differently: Take 0.5 mg by mouth at bedtime.  05/30/17  Yes Leone Haven, MD  senna (SENOKOT) 8.6 MG TABS tablet Take 2 tablets by mouth at bedtime.   Yes [provider]  silver sulfADIAZINE (SILVADENE) 1 % cream Apply 1 application topically daily as needed (wound care).   Yes [provider]  timolol (TIMOPTIC) 0.5 % ophthalmic solution Place 1 drop into both eyes at bedtime.   Yes [provider]  trolamine salicylate (ASPERCREME) 10 % cream Apply 1 application topically 3 (three) times daily. Apply to right knee and ankle for pain   Yes [provider]  mirtazapine (REMERON SOL-TAB) 15 MG disintegrating tablet Take 0.5 tablets (7.5 mg total) by mouth at bedtime. Patient not taking: Reported on 08/20/2018 12/27/17   Hosie Poisson, MD  traMADol (ULTRAM) 50 MG tablet Take 1 tablet (50 mg total) by mouth every 12 (twelve) hours. Patient not taking: Reported on 08/20/2018 05/02/18   Medina-Vargas, Jaymes Graff C, NP    Anti-infectives (From admission, onward)   Start     Dose/Rate Route Frequency Ordered Stop   08/23/18 0500  ceFAZolin (ANCEF) IVPB 2g/100 mL premix    Note to Pharmacy: Send with patient to specials   2 g 200 mL/hr over 30 Minutes Intravenous On call 08/22/18 1050 08/24/18 0500   08/21/18 1400  ceFEPIme (MAXIPIME) 1 g in sodium chloride 0.9 % 100 mL IVPB  Status:  Discontinued     1 g 200 mL/hr over 30 Minutes Intravenous Every 24 hours 08/20/18 1421 08/21/18 1212   08/21/18 1400  vancomycin (VANCOCIN) IVPB 750 mg/150 ml premix     750 mg 150 mL/hr over 60 Minutes Intravenous Every 24 hours 08/21/18 1212     08/21/18 1300  ceFEPIme (MAXIPIME) 2 g in sodium chloride 0.9 % 100 mL IVPB     2 g 200 mL/hr over 30 Minutes  Intravenous Every 12 hours 08/21/18 1212     08/20/18 1417  vancomycin variable dose per unstable renal function (pharmacist dosing)  Status:  Discontinued      Does not apply See admin instructions 08/20/18 1419 08/21/18 1212   08/20/18 1330  vancomycin (VANCOCIN) 1,500 mg in sodium chloride 0.9 % 500 mL IVPB     1,500 mg 250 mL/hr over 120 Minutes Intravenous  Once 08/20/18 1303 08/20/18 1647   08/20/18 1315  ceFEPIme (MAXIPIME) 2 g in sodium chloride 0.9 % 100 mL IVPB     2 g 200 mL/hr over 30 Minutes Intravenous  Once 08/20/18 1308 08/20/18 1454      Scheduled Meds: . docusate sodium  100 mg Oral Daily  . enoxaparin (LOVENOX) injection  1 mg/kg Subcutaneous Q12H  . feeding supplement (ENSURE ENLIVE)  237 mL Oral BID BM  . FLUoxetine  40 mg Oral Daily  . furosemide  20 mg Oral Daily  . gabapentin  300 mg Oral TID  . insulin aspart  0-5 Units Subcutaneous QHS  . insulin aspart  0-9 Units Subcutaneous TID WC  . insulin glargine  55 Units Subcutaneous QHS  . latanoprost  1 drop Both Eyes QHS  . levETIRAcetam  500 mg Oral BID  . metoprolol tartrate  12.5 mg Oral Daily  . multivitamin with minerals  1 tablet Oral Daily  . oxybutynin  5 mg Oral BID  . pantoprazole  40 mg Oral Daily  . polyethylene glycol  17 g Oral Daily  . rOPINIRole  0.5 mg Oral QHS  . senna  2 tablet Oral QHS  . timolol  1 drop Both Eyes QHS  . vitamin C  250 mg Oral BID   Continuous Infusions: . sodium chloride    . [START ON 08/23/2018] sodium chloride    . [START ON 08/23/2018]  ceFAZolin (ANCEF) IV    . ceFEPime (MAXIPIME) IV 2 g (08/22/18 0923)  . vancomycin 750 mg (08/22/18 1320)   PRN Meds:.docusate sodium, morphine injection, nitroGLYCERIN, ondansetron (ZOFRAN) IV, oxyCODONE-acetaminophen  Allergies  Allergen Reactions  . Amlodipine Cough  . Clopidogrel Bisulfate Cough  . Kenalog [Triamcinolone Acetonide] Other (See Comments)    unknown  . Lisinopril Cough  . Losartan Cough  . Sulfa  Antibiotics Hives and Rash    Physical Exam: Patient is noncommunicative.  System reviewed from hospitalist prior progress note.  Patient is noncommunicative.  Most information was gleaned from her daughter.  Spoke with her yesterday.  Vitals  Blood pressure (!) 143/66, pulse 73, temperature 98 F (36.7 C), temperature source Oral, resp. rate 16, height 5\' 4"  (1.626 m), weight 65.3 kg, SpO2 96 %.  Lower Extremity exam: Patient has a large black eschar on the posterior right heel is approximately 6 cm x 7 cm there is some mild erythema along the periphery of the but does not appear to be dressing and cellulitis at this point.  Likely responding some to the IV antibiotics.  She does seem to express some discomfort whenever you move her leg around.  The wound also appears to be fairly deep I think it is significantly progressed to the point where bone was involved.  MRI did show significant likelihood of osteomyelitis throughout the posterior one half of the calcaneus.  Also showed distal Achilles necrosis and infection as well. Data Review  CBC  Recent Labs  Lab 08/20/18 1307 08/21/18 0338  WBC 19.2* 15.1*  HGB 10.5* 8.7*  HCT 33.7* 27.1*  PLT 509* 389  MCV 98.0 94.8  MCH 30.5 30.4  MCHC 31.2 32.1  RDW 13.2 13.0  LYMPHSABS 1.9  --   MONOABS 1.3*  --   EOSABS 0.1  --   BASOSABS 0.1  --    ------------------------------------------------------------------------------------------------------------------  Chemistries  Recent Labs  Lab 08/20/18 1307 08/21/18 0338 08/22/18 0550  NA 137 135  --   K 4.0 3.5  --   CL 100 104  --   CO2 25 23  --   GLUCOSE 86 187*  --   BUN 41* 27*  --   CREATININE 1.24* 0.83 0.89  CALCIUM 9.0 8.1*  --   AST 22  --   --   ALT 20  --   --   ALKPHOS 106  --   --   BILITOT 0.3  --   --    --------------- Imaging results:   Mr Heel Right Wo Contrast  Result Date: 08/20/2018 CLINICAL DATA:  Large heel ulcer. EXAM: MR OF THE RIGHT HEEL WITHOUT  CONTRAST TECHNIQUE: Multiplanar, multisequence MR imaging of the right foot was performed. No intravenous contrast was administered. COMPARISON:  Radiographs 08/20/2018 FINDINGS: There is a large heel ulcer right down to the bone with underlying osteomyelitis involving the posterior third of the calcaneus. There is also likely septic retrocalcaneal bursitis and the Achilles tendon is partially detached from the calcaneus and surrounded by fluid/pus. Signal abnormality in the tendon could be septic tendinitis. No discrete drainable fluid collection/abscess. Diffuse myositis involving the short flexor muscles of the foot but no definite findings for pyomyositis. The tibiotalar joint demonstrates a small effusion but the cartilage is intact and the subtalar joints are intact. Sinus tarsi is normal. IMPRESSION: 1. Large and deep heel ulcer extending right down to the bone with surrounding granulation tissue and cellulitis. 2. Myofasciitis involving the short flexor muscles but no definite findings for pyomyositis. 3. Extensive osteomyelitis involving the posterior third of the calcaneus. 4. The Achilles tendon is partially torn from its calcaneal attachment site and there could be septic tendinitis and septic retrocalcaneal bursitis. Electronically Signed   By: Marijo Sanes M.D.   On: 08/20/2018 20:47    Assessment & Plan: I spoke with the patient's daughter yesterday and explained a couple different scenarios for her.  She is scheduled for angioplasty and catheterization tomorrow with Dr. Delana Meyer.  If I were to do any debridement it would be to try to control the infection better.  It would have to be extensive including resection of the distal Achilles tendon as well as the posterior one half of the calcaneus along with excision of all the necrotic tissue.  Some of this might be able be closed but likely not all of it and she have to have a wound VAC on for possibly prolonged period to try to get healing.  The  other option would be to consider an amputation according to the direction of Dr. Delana Meyer.  To be much more definitive as far as timely healing.  The daughter also expressed the possibility of just treating with antibiotics and continue palliative care.  Think that is an option depended on her overall status and life expectancy.  She is nonambulatory.  Active Problems:   Osteomyelitis [facility])   Family Communication: Plan discussed with patient and her daughter.  I will discuss further with Dr. Delana Meyer after  surgery tomorrow and also discussed with the daughter again.  Albertine Patricia M.D on 08/22/2018 at 3:12 PM  Thank you for the consult, we will follow the patient with you in the Hospital.

## 2018-08-23 ENCOUNTER — Encounter
Admission: EM | Disposition: A | Discharge status: Skilled Nursing Facility | Payer: Self-pay | Source: Home / Self Care | Attending: Internal Medicine

## 2018-08-23 ENCOUNTER — Encounter: Payer: Self-pay | Admitting: Vascular Surgery

## 2018-08-23 DIAGNOSIS — Z66 Do not resuscitate: Secondary | ICD-10-CM

## 2018-08-23 DIAGNOSIS — Z515 Encounter for palliative care: Secondary | ICD-10-CM

## 2018-08-23 DIAGNOSIS — A419 Sepsis, unspecified organism: Principal | ICD-10-CM

## 2018-08-23 DIAGNOSIS — Z7189 Other specified counseling: Secondary | ICD-10-CM

## 2018-08-23 DIAGNOSIS — L97416 Non-pressure chronic ulcer of right heel and midfoot with bone involvement without evidence of necrosis: Secondary | ICD-10-CM

## 2018-08-23 DIAGNOSIS — I70261 Atherosclerosis of native arteries of extremities with gangrene, right leg: Secondary | ICD-10-CM

## 2018-08-23 DIAGNOSIS — L0390 Cellulitis, unspecified: Secondary | ICD-10-CM

## 2018-08-23 HISTORY — PX: LOWER EXTREMITY ANGIOGRAPHY: CATH118251

## 2018-08-23 LAB — GLUCOSE, CAPILLARY
Glucose-Capillary: 107 mg/dL — ABNORMAL HIGH (ref 70–99)
Glucose-Capillary: 111 mg/dL — ABNORMAL HIGH (ref 70–99)
Glucose-Capillary: 141 mg/dL — ABNORMAL HIGH (ref 70–99)
Glucose-Capillary: 154 mg/dL — ABNORMAL HIGH (ref 70–99)
Glucose-Capillary: 167 mg/dL — ABNORMAL HIGH (ref 70–99)
Glucose-Capillary: 98 mg/dL (ref 70–99)

## 2018-08-23 LAB — BASIC METABOLIC PANEL
Anion gap: 8 (ref 5–15)
BUN: 16 mg/dL (ref 8–23)
CO2: 24 mmol/L (ref 22–32)
Calcium: 8.6 mg/dL — ABNORMAL LOW (ref 8.9–10.3)
Chloride: 105 mmol/L (ref 98–111)
Creatinine, Ser: 0.9 mg/dL (ref 0.44–1.00)
GFR calc Af Amer: >60 mL/min (ref >60)
GFR calc non Af Amer: 58 mL/min — ABNORMAL LOW (ref >60)
Glucose, Bld: 136 mg/dL — ABNORMAL HIGH (ref 70–99)
Potassium: 3.6 mmol/L (ref 3.5–5.1)
Sodium: 137 mmol/L (ref 135–145)

## 2018-08-23 LAB — CBC
HCT: 29.8 % — ABNORMAL LOW (ref 36.0–46.0)
Hemoglobin: 9.6 g/dL — ABNORMAL LOW (ref 12.0–15.0)
MCH: 30.2 pg (ref 26.0–34.0)
MCHC: 32.2 g/dL (ref 30.0–36.0)
MCV: 93.7 fL (ref 80.0–100.0)
Platelets: 424 10*3/uL — ABNORMAL HIGH (ref 150–400)
RBC: 3.18 MIL/uL — ABNORMAL LOW (ref 3.87–5.11)
RDW: 12.8 % (ref 11.5–15.5)
WBC: 12 10*3/uL — ABNORMAL HIGH (ref 4.0–10.5)
nRBC: 0 % (ref 0.0–0.2)

## 2018-08-23 LAB — MAGNESIUM: Magnesium: 2 mg/dL (ref 1.7–2.4)

## 2018-08-23 SURGERY — LOWER EXTREMITY ANGIOGRAPHY
Anesthesia: Moderate Sedation | Laterality: Right

## 2018-08-23 MED ORDER — FENTANYL CITRATE (PF) 100 MCG/2ML IJ SOLN
INTRAMUSCULAR | Status: DC | PRN
  Administered 2018-08-23 (×2): 25 ug via INTRAVENOUS

## 2018-08-23 MED ORDER — HEPARIN SODIUM (PORCINE) 1000 UNIT/ML IJ SOLN
INTRAMUSCULAR | Status: AC
  Filled 2018-08-23: qty 1

## 2018-08-23 MED ORDER — SODIUM CHLORIDE 0.9% FLUSH
3.0000 mL | INTRAVENOUS | Status: DC | PRN
  Administered 2018-08-27: 3 mL via INTRAVENOUS
  Filled 2018-08-23: qty 3

## 2018-08-23 MED ORDER — LABETALOL HCL 5 MG/ML IV SOLN
10.0000 mg | INTRAVENOUS | Status: DC | PRN
  Administered 2018-08-29: 10 mg via INTRAVENOUS
  Filled 2018-08-23: qty 4

## 2018-08-23 MED ORDER — LIDOCAINE HCL (PF) 1 % IJ SOLN
INTRAMUSCULAR | Status: AC
  Filled 2018-08-23: qty 30

## 2018-08-23 MED ORDER — HEPARIN SODIUM (PORCINE) 1000 UNIT/ML IJ SOLN
INTRAMUSCULAR | Status: DC | PRN
  Administered 2018-08-23: 5000 [IU] via INTRAVENOUS

## 2018-08-23 MED ORDER — FENTANYL CITRATE (PF) 100 MCG/2ML IJ SOLN
INTRAMUSCULAR | Status: AC
  Filled 2018-08-23: qty 2

## 2018-08-23 MED ORDER — SODIUM CHLORIDE 0.9 % IV SOLN
250.0000 mL | INTRAVENOUS | Status: DC | PRN
  Administered 2018-08-31: 250 mL via INTRAVENOUS

## 2018-08-23 MED ORDER — HYDRALAZINE HCL 20 MG/ML IJ SOLN: 5.0000 mg | INTRAMUSCULAR | Status: DC | PRN

## 2018-08-23 MED ORDER — IODIXANOL 320 MG/ML IV SOLN
INTRAVENOUS | Status: DC | PRN
  Administered 2018-08-23: 90 mL via INTRA_ARTERIAL

## 2018-08-23 MED ORDER — ONDANSETRON HCL 4 MG/2ML IJ SOLN: 4.0000 mg | Freq: Four times a day (QID) | INTRAMUSCULAR | Status: DC | PRN

## 2018-08-23 MED ORDER — MIDAZOLAM HCL 2 MG/2ML IJ SOLN
INTRAMUSCULAR | Status: AC
  Filled 2018-08-23: qty 2

## 2018-08-23 MED ORDER — MIDAZOLAM HCL 2 MG/2ML IJ SOLN
INTRAMUSCULAR | Status: DC | PRN
  Administered 2018-08-23 (×2): 0.5 mg via INTRAVENOUS

## 2018-08-23 MED ORDER — SODIUM CHLORIDE 0.9% FLUSH
3.0000 mL | Freq: Two times a day (BID) | INTRAVENOUS | Status: DC
  Administered 2018-08-23 – 2018-09-02 (×13): 3 mL via INTRAVENOUS

## 2018-08-23 MED ORDER — SODIUM CHLORIDE 0.9 % IV SOLN
INTRAVENOUS | Status: AC
  Administered 2018-08-23: 11:00:00 via INTRAVENOUS

## 2018-08-23 MED ORDER — MORPHINE SULFATE (PF) 2 MG/ML IV SOLN
2.0000 mg | INTRAVENOUS | Status: DC | PRN
  Administered 2018-08-29 – 2018-08-31 (×4): 2 mg via INTRAVENOUS
  Filled 2018-08-23 (×4): qty 1

## 2018-08-23 MED ORDER — LABETALOL HCL 5 MG/ML IV SOLN
5.0000 mg | Freq: Once | INTRAVENOUS | Status: AC
  Administered 2018-08-23: 5 mg via INTRAVENOUS

## 2018-08-23 MED ORDER — LABETALOL HCL 5 MG/ML IV SOLN
INTRAVENOUS | Status: AC
  Filled 2018-08-23: qty 4

## 2018-08-23 SURGICAL SUPPLY — 20 items
BALLN LUTONIX  018 4X60X130 (BALLOONS) ×1
BALLN LUTONIX 018 4X60X130 (BALLOONS) ×1
BALLN ULTRSCOR 014 2.5X200X150 (BALLOONS) ×2
BALLOON LUTONIX 018 4X60X130 (BALLOONS) IMPLANT
BALLOON ULTRSC 014 2.5X200X150 (BALLOONS) IMPLANT
CATH BEACON 5 .035 65 RIM TIP (CATHETERS) ×1 IMPLANT
CATH PIG 70CM (CATHETERS) ×1 IMPLANT
CATH VERT 5FR 125CM (CATHETERS) ×1 IMPLANT
DEVICE PRESTO INFLATION (MISCELLANEOUS) ×1 IMPLANT
DEVICE STARCLOSE SE CLOSURE (Vascular Products) ×1 IMPLANT
GLIDEWIRE ADV .035X260CM (WIRE) ×2 IMPLANT
NDL ENTRY 21GA 7CM ECHOTIP (NEEDLE) IMPLANT
NEEDLE ENTRY 21GA 7CM ECHOTIP (NEEDLE) ×2 IMPLANT
PACK ANGIOGRAPHY (CUSTOM PROCEDURE TRAY) ×2 IMPLANT
SET INTRO CAPELLA COAXIAL (SET/KITS/TRAYS/PACK) ×1 IMPLANT
SHEATH BRITE TIP 5FRX11 (SHEATH) ×1 IMPLANT
SHEATH RAABE 6FR (SHEATH) ×1 IMPLANT
TUBING CONTRAST HIGH PRESS 72 (TUBING) ×1 IMPLANT
WIRE HI TORQ WHISPER MS 300CM (WIRE) ×1 IMPLANT
WIRE J 3MM .035X145CM (WIRE) ×1 IMPLANT

## 2018-08-23 NOTE — Progress Notes (Signed)
[facility] [place] NAME: Abigail Boyd    MR#:  [id number]  DATE OF BIRTH:  [birth year]  SUBJECTIVE:  CHIEF COMPLAINT:   Chief Complaint  Patient presents with  . Wound Infection   Sent from long-term care facility with wound on right ankle which looks like osteomyelitis. Patient still have pain and has some confusion.  She denies any other complaints. Status post angiogram and angioplasty.  REVIEW OF SYSTEMS:  Patient is confused and cannot give reliable review of system. ROS  DRUG ALLERGIES:   Allergies  Allergen Reactions  . Amlodipine Cough  . Clopidogrel Bisulfate Cough  . Kenalog [Triamcinolone Acetonide] Other (See Comments)    unknown  . Lisinopril Cough  . Losartan Cough  . Sulfa Antibiotics Hives and Rash    VITALS:  Blood pressure 132/67, pulse 70, temperature 98.2 F (36.8 C), temperature source Oral, resp. rate 15, height 5\' 4"  (1.626 m), weight 65.3 kg, SpO2 100 %.  PHYSICAL EXAMINATION:   GENERAL:  85 y.o.-year-old patient lying in the bed with no acute distress.  Slightly lethargic. EYES: Pupils equal, round, reactive to light and accommodation. No scleral icterus. Extraocular muscles intact.  HEENT: Head atraumatic, normocephalic. Oropharynx and nasopharynx clear.  NECK:  Supple, no jugular venous distention. No thyroid enlargement, no tenderness.  LUNGS: Normal breath sounds bilaterally, no wheezing, rales,rhonchi or crepitation. No use of accessory muscles of respiration.  CARDIOVASCULAR: S1, S2 normal. No murmurs, rubs, or gallops.  ABDOMEN: Soft, nontender, nondistended. Bowel sounds present. No organomegaly or mass.  EXTREMITIES: No pedal edema, cyanosis, or clubbing.  Right heel has a bleeding ulcer with some surrounding redness.  Have dressing present.    NEUROLOGIC: Cranial nerves II through XII are intact. Muscle strength 2-3/5 in all extremities. Sensation intact. Gait not checked.  PSYCHIATRIC: The  patient is alert and oriented x 3.  SKIN: No obvious rash, lesion, or ulcer.   Physical Exam LABORATORY PANEL:   CBC Recent Labs  Lab 08/23/18 0515  WBC 12.0*  HGB 9.6*  HCT 29.8*  PLT 424*   ------------------------------------------------------------------------------------------------------------------  Chemistries  Recent Labs  Lab 08/20/18 1307  08/23/18 0515  NA 137   < > 137  K 4.0   < > 3.6  CL 100   < > 105  CO2 25   < > 24  GLUCOSE 86   < > 136*  BUN 41*   < > 16  CREATININE 1.24*   < > 0.90  CALCIUM 9.0   < > 8.6*  MG  --   --  2.0  AST 22  --   --   ALT 20  --   --   ALKPHOS 106  --   --   BILITOT 0.3  --   --    < > = values in this interval not displayed.   ------------------------------------------------------------------------------------------------------------------  Cardiac Enzymes No results for input(s): TROPONINI in the last 168 hours. ------------------------------------------------------------------------------------------------------------------  RADIOLOGY:  No results found.  ASSESSMENT AND PLAN:   Active Problems:   Osteomyelitis ([place])   *Diabetic ulcer on right heel  osteomyelitis Broad-spectrum antibiotics for now.  X-ray does not give a clear diagnosis of osteomyelitis confirmed on MRI. Podiatry consult appreciated.  Suggested to get vascular involvement. Pain management as needed. Vascular is suggesting likely above-knee amputation to avoid contracture as patient is bedbound.   Vascular had done angiogram and angioplasty today. I have called palliative care consult as patient have  very poor prognosis and needing this major surgery, as per them daughter was rethinking the decisions about doing any kind of debridement or amputation and leaning towards full comfort.  *Diabetes mellitus with CKD stage III We will continue basal insulin and keep on sliding scale coverage. Avoid nephrotoxic medication and monitor renal function  closely.  *Paroxysmal atrial fibrillation She is on beta-blocker and Xarelto. Continue rate control but hold Xarelto in anticipation of procedure by podiatry. We will give her Lovenox therapeutic dose to cover while we are holding Xarelto.  *Coronary artery disease status post stent Continue cardiac meds.  *Essential hypertension Continue home medications, controlled well.  *History of stroke Long-term resident at nursing facility Patient is bedbound at baseline.  As per daughter she lost a lot of weight in last few month.  Called palliative care.  As per their initial discussion daughter does not seem to be interested in doing amputation but leaning towards comfort.  Further decisions are pending.  All the records are reviewed and case discussed with Care Management/Social Workerr. Management plans discussed with the patient, family and they are in agreement.  CODE STATUS: DNR.  TOTAL TIME TAKING CARE OF THIS PATIENT: 35 minutes.    POSSIBLE D/C IN 2-3 DAYS, DEPENDING ON CLINICAL CONDITION.   [name] M.D on 08/23/2018   Between 7am to 6pm - Pager - [phone]  After 6pm go to [email or web address] - password [email or web address] [facility]  Office  [phone]  CC: Primary care physician; Kirk Ruths, MD  Note: This dictation was prepared with Dragon dictation along with smaller phrase technology. Any transcriptional errors that result from this process are unintentional.

## 2018-08-23 NOTE — Progress Notes (Addendum)
IVANNA, Boyd ([id number]) Visit Report for 08/20/2018 Allergy List Details Patient Name: Abigail Boyd, Abigail Boyd. Date of Service: 08/20/2018 9:45 AM Medical Record Number: [id number] Patient Account Number: [id number] Date of Birth/Sex: 10-23-1929 (85 y.o. F) Treating RN: Army Melia Primary Care Gavriel Holzhauer: Frazier Richards Other Clinician: Referring Tamma Brigandi: Frazier Richards Treating Junious Ragone/Extender: STONE III, HOYT Weeks in Treatment: 0 Allergies Active Allergies Sulfa (Sulfonamide Antibiotics) Reaction: Hives Severity: Severe amlodipine Reaction: cough Severity: Moderate lisinopril Reaction: cough Severity: Moderate losartan Reaction: cough Severity: Moderate Allergy Notes Electronic Signature(s) Signed: 08/20/2018 4:25:12 PM By: Montey Hora Entered By: Montey Hora on 08/20/2018 11:09:08 Hunger, Mekesha F. ([id number]) -------------------------------------------------------------------------------- Arrival Information Details Patient Name: Abigail Aase F. Date of Service: 08/20/2018 9:45 AM Medical Record Number: [id number] Patient Account Number: [id number] Date of Birth/Sex: 1929/05/18 (85 y.o. F) Treating RN: Army Melia Primary Care Ranika Mcniel: Frazier Richards Other Clinician: Referring Omarri Eich: Frazier Richards Treating Chuckie Mccathern/Extender: Melburn Hake, HOYT Weeks in Treatment: 0 Visit Information Patient Arrived: Wheel Chair Arrival Time: 10:00 Accompanied By: daughter Transfer Assistance: Harrel Lemon Lift Patient Has Alerts: Yes Patient Alerts: Patient on Blood Thinner Xarelto DMII Electronic Signature(s) Signed: 08/20/2018 4:25:12 PM By: Montey Hora Entered By: Montey Hora on 08/20/2018 11:08:14 Gervase, Sully F. ([id number]) -------------------------------------------------------------------------------- Clinic Level of Care Assessment Details Patient Name: Abigail Boyd. Date of Service: 08/20/2018 9:45 AM Medical Record Number: [id number] Patient Account  Number: [id number] Date of Birth/Sex: March 17, 1929 (85 y.o. F) Treating RN: Montey Hora Primary Care Bekah Igoe: Frazier Richards Other Clinician: Referring Demecia Northway: Frazier Richards Treating Annah Jasko/Extender: Melburn Hake, HOYT Weeks in Treatment: 0 Clinic Level of Care Assessment Items TOOL 2 Quantity Score []  - Use when only an EandM is performed on the INITIAL visit 0 ASSESSMENTS - Nursing Assessment / Reassessment X - General Physical Exam (combine w/ comprehensive assessment (listed just below) when 1 20 performed on new pt. evals) X- 1 25 Comprehensive Assessment (HX, ROS, Risk Assessments, Wounds Hx, etc.) ASSESSMENTS - Wound and Skin Assessment / Reassessment []  - Simple Wound Assessment / Reassessment - one wound 0 X- 3 5 Complex Wound Assessment / Reassessment - multiple wounds []  - 0 Dermatologic / Skin Assessment (not related to wound area) ASSESSMENTS - Ostomy and/or Continence Assessment and Care X - Incontinence Assessment and Management 1 10 []  - 0 Ostomy Care Assessment and Management (repouching, etc.) PROCESS - Coordination of Care X - Simple Patient / Family Education for ongoing care 1 15 []  - 0 Complex (extensive) Patient / Family Education for ongoing care X- 1 10 Staff obtains Programmer, systems, Records, Test Results / Process Orders []  - 0 Staff telephones HHA, Nursing Homes / Clarify orders / etc []  - 0 Routine Transfer to another Facility (non-emergent condition) []  - 0 Routine Hospital Admission (non-emergent condition) X- 1 15 New Admissions / Biomedical engineer / Ordering NPWT, Apligraf, etc. []  - 0 Emergency Hospital Admission (emergent condition) []  - 0 Simple Discharge Coordination X- 1 15 Complex (extensive) Discharge Coordination PROCESS - Special Needs []  - Pediatric / Minor Patient Management 0 []  - 0 Isolation Patient Management Thorpe, Joann F. ([id number]) []  - 0 Hearing / Language / Visual special needs []  - 0 Assessment of  Community assistance (transportation, D/C planning, etc.) []  - 0 Additional assistance / Altered mentation []  - 0 Support Surface(s) Assessment (bed, cushion, seat, etc.) INTERVENTIONS - Wound Cleansing / Measurement X - Wound Imaging (photographs - any number of wounds) 1 5 []  - 0 Wound Tracing (instead of photographs) []  - 0 Simple Wound Measurement - one  wound X- 3 5 Complex Wound Measurement - multiple wounds []  - 0 Simple Wound Cleansing - one wound X- 3 5 Complex Wound Cleansing - multiple wounds INTERVENTIONS - Wound Dressings X - Small Wound Dressing one or multiple wounds 3 10 []  - 0 Medium Wound Dressing one or multiple wounds []  - 0 Large Wound Dressing one or multiple wounds []  - 0 Application of Medications - injection INTERVENTIONS - Miscellaneous []  - External ear exam 0 []  - 0 Specimen Collection (cultures, biopsies, blood, body fluids, etc.) []  - 0 Specimen(s) / Culture(s) sent or taken to Lab for analysis X- 1 10 Patient Transfer (multiple staff / Civil Service fast streamer / Similar devices) []  - 0 Simple Staple / Suture removal (25 or less) []  - 0 Complex Staple / Suture removal (26 or more) []  - 0 Hypo / Hyperglycemic Management (close monitor of Blood Glucose) []  - 0 Ankle / Brachial Index (ABI) - do not check if billed separately Has the patient been seen at the hospital within the last three years: Yes Total Score: 200 Level Of Care: New/Established - Level 5 Electronic Signature(s) Signed: 09/02/2018 10:21:35 AM By: Sharon Mt Previous Signature: 08/20/2018 4:25:12 PM Version By: Montey Hora Entered By: Sharon Mt on 08/22/2018 09:26:33 Caputi, Sevilla F. ([id number]) -------------------------------------------------------------------------------- Lower Extremity Assessment Details Patient Name: Abigail Boyd. Date of Service: 08/20/2018 9:45 AM Medical Record Number: [id number] Patient Account Number: [id number] Date of Birth/Sex: 1929/05/07 (85 y.o.  F) Treating RN: Army Melia Primary Care Journei Thomassen: Frazier Richards Other Clinician: Referring Yoni Lobos: Frazier Richards Treating Jasemine Nawaz/Extender: STONE III, HOYT Weeks in Treatment: 0 Edema Assessment Assessed: [Left: No] [Right: No] Edema: [Left: No] [Right: No] Vascular Assessment Pulses: Dorsalis Pedis Palpable: [Left:Yes] [Right:Yes] Electronic Signature(s) Signed: 08/20/2018 3:33:36 PM By: Army Melia Entered By: Army Melia on 08/20/2018 10:50:09 Martos, Rivkah F. ([id number]) -------------------------------------------------------------------------------- Multi Wound Chart Details Patient Name: Abigail Aase F. Date of Service: 08/20/2018 9:45 AM Medical Record Number: [id number] Patient Account Number: [id number] Date of Birth/Sex: [birth year] (85 y.o. F) Treating RN: Montey Hora Primary Care Torien Ramroop: Frazier Richards Other Clinician: Referring Sherlock Nancarrow: Frazier Richards Treating Norberta Stobaugh/Extender: Melburn Hake, HOYT Weeks in Treatment: 0 Vital Signs Height(in): 23 Pulse(bpm): 70 Weight(lbs): 144 Blood Pressure(mmHg): 100/45 Body Mass Index(BMI): 25 Temperature(F): 98.3 Respiratory Rate 16 (breaths/min): Photos: Wound Location: Right Calcaneus Sacrum - Midline Left Metatarsal head first Wounding Event: Blister Pressure Injury Pressure Injury Primary Etiology: Pressure Ulcer Pressure Ulcer Pressure Ulcer Comorbid History: Cataracts, Glaucoma, Cataracts, Glaucoma, Cataracts, Glaucoma, Coronary Artery Disease, Coronary Artery Disease, Coronary Artery Disease, Hypertension, Myocardial Hypertension, Myocardial Hypertension, Myocardial Infarction, Type II Diabetes Infarction, Type II Diabetes Infarction, Type II Diabetes Date Acquired: 06/28/2018 07/29/2018 07/29/2018 Weeks of Treatment: 0 0 0 Wound Status: Open Open Open Measurements L x W x D 7.5x7.5x0.1 1.5x4.5x0.6 1x1x0.1 (cm) Area (cm) : 44.179 5.301 0.785 Volume (cm) : 4.418 3.181 0.079 % Reduction in  Area: 0.00% 0.00% 0.00% % Reduction in Volume: 0.00% 0.00% 0.00% Classification: Unstageable/Unclassified Category/Stage II Category/Stage II Exudate Amount: Large Medium None Present Exudate Type: Purulent Purulent N/A Exudate Color: yellow, brown, green yellow, brown, green N/A Granulation Amount: None Present (0%) Small (1-33%) None Present (0%) Granulation Quality: N/A Pink N/A Necrotic Amount: Large (67-100%) Large (67-100%) Medium (34-66%) Necrotic Tissue: Eschar, Adherent [place] Eschar Exposed Structures: Fat Layer (Subcutaneous Fat Layer (Subcutaneous Fascia: No Tissue) Exposed: Yes Tissue) Exposed: Yes Fat Layer (Subcutaneous Fascia: No Fascia: No Tissue) Exposed: No Tendon: No Tendon: No Tendon: No Muscle: No Muscle: No Muscle:  No Sonnier, Elany F. ([id number]) Joint: No Joint: No Joint: No Bone: No Bone: No Bone: No Limited to Skin Breakdown Epithelialization: None None None Treatment Notes Electronic Signature(s) Signed: 08/20/2018 4:25:12 PM By: Montey Hora Entered By: Montey Hora on 08/20/2018 11:04:17 Llewellyn, Jalayla F. ([id number]) -------------------------------------------------------------------------------- [place] Details Patient Name: Abigail Boyd. Date of Service: 08/20/2018 9:45 AM Medical Record Number: [id number] Patient Account Number: [id number] Date of Birth/Sex: [birth year] (85 y.o. F) Treating RN: Montey Hora Primary Care Ceairra Mccarver: Frazier Richards Other Clinician: Referring Kinzleigh Kandler: Frazier Richards Treating Jaskirat Schwieger/Extender: Melburn Hake, HOYT Weeks in Treatment: 0 Active Inactive Electronic Signature(s) Signed: 09/10/2018 11:58:33 AM By: Gretta Cool, BSN, RN, CWS, Kim RN, BSN Signed: 10/25/2018 1:03:09 PM By: Montey Hora Previous Signature: 08/20/2018 4:25:12 PM Version By: Montey Hora Entered By: Gretta Cool BSN, RN, CWS, Kim on 09/10/2018 11:58:33 Sweet, Rae Halsted  ([id number]) -------------------------------------------------------------------------------- Pain Assessment Details Patient Name: NAOMA, BOXELL. Date of Service: 08/20/2018 9:45 AM Medical Record Number: [id number] Patient Account Number: [id number] Date of Birth/Sex: 1929-04-08 (85 y.o. F) Treating RN: Army Melia Primary Care Merriam Brandner: Frazier Richards Other Clinician: Referring Madolyn Ackroyd: Frazier Richards Treating Altha Sweitzer/Extender: Melburn Hake, HOYT Weeks in Treatment: 0 Active Problems Location of Pain Severity and Description of Pain Patient Has Paino Patient Unable to Respond Site Locations Pain Management and Medication Current Pain Management: Electronic Signature(s) Signed: 08/20/2018 3:33:36 PM By: Army Melia Entered By: Army Melia on 08/20/2018 10:00:41 Abigail Boyd ([id number]) -------------------------------------------------------------------------------- Patient/Caregiver Education Details Patient Name: Abigail Boyd. Date of Service: 08/20/2018 9:45 AM Medical Record Number: [id number] Patient Account Number: [id number] Date of Birth/Gender: [birth year] (85 y.o. F) Treating RN: Montey Hora Primary Care Physician: Frazier Richards Other Clinician: Referring Physician: Frazier Richards Treating Physician/Extender: Melburn Hake, HOYT Weeks in Treatment: 0 Education Assessment Education Provided To: Caregiver dtr and SNF nurses Education Topics Provided Offloading: Handouts: Other: need for air mattress Methods: Printed Wound/Skin Impairment: Handouts: Other: signed wound care orders Methods: Explain/Verbal, Printed Responses: State content correctly Electronic Signature(s) Signed: 08/20/2018 4:25:12 PM By: Montey Hora Entered By: Montey Hora on 08/20/2018 11:37:02 Margerum, Gianna F. ([id number]) -------------------------------------------------------------------------------- Wound Assessment Details Patient Name: Abigail Boyd. Date of Service:  08/20/2018 9:45 AM Medical Record Number: [id number] Patient Account Number: [id number] Date of Birth/Sex: 03-Feb-1930 (85 y.o. F) Treating RN: Army Melia Primary Care Mitzi Lilja: Frazier Richards Other Clinician: Referring Cecilio Ohlrich: Frazier Richards Treating Derry Arbogast/Extender: Melburn Hake, HOYT Weeks in Treatment: 0 Wound Status Wound Number: 1 Primary Pressure Ulcer Etiology: Wound Location: Right Calcaneus Wound Open Wounding Event: Blister Status: Date Acquired: 06/28/2018 Comorbid Cataracts, Glaucoma, Coronary Artery Disease, Weeks Of Treatment: 0 History: Hypertension, Myocardial Infarction, Type II Clustered Wound: No Diabetes Photos Wound Measurements Length: (cm) 7.5 % Reduction Width: (cm) 7.5 % Reduction Depth: (cm) 0.1 Epithelializ Area: (cm) 44.179 Tunneling: Volume: (cm) 4.418 Undermining in Area: 0% in Volume: 0% ation: None No : No Wound Description Classification: Unstageable/Unclassified Foul Odor Exudate Amount: Large Slough/Fib Exudate Type: Purulent Exudate Color: yellow, brown, green After Cleansing: No rino Yes Wound Bed Granulation Amount: None Present (0%) Exposed Structure Necrotic Amount: Large (67-100%) Fascia Exposed: No Necrotic Quality: Eschar, Adherent Slough Fat Layer (Subcutaneous Tissue) Exposed: Yes Tendon Exposed: No Muscle Exposed: No Joint Exposed: No Bone Exposed: No Electronic Signature(s) Signed: 08/20/2018 3:33:36 PM By: Erline Hau, Tywanna F. ([id number]) Entered By: Army Melia on 08/20/2018 10:46:01 Jarboe, Harnoor F. ([id number]) -------------------------------------------------------------------------------- Wound Assessment Details Patient Name: Abigail Boyd. Date of Service: 08/20/2018 9:45 AM Medical Record Number: [id number] Patient Account  Number: [id number] Date of Birth/Sex: 05-Jul-1929 (85 y.o. F) Treating RN: Army Melia Primary Care Javari Bufkin: Frazier Richards Other Clinician: Referring Summer Mccolgan:  Frazier Richards Treating Alveta Quintela/Extender: Melburn Hake, HOYT Weeks in Treatment: 0 Wound Status Wound Number: 2 Primary Pressure Ulcer Etiology: Wound Location: Sacrum - Midline Wound Open Wounding Event: Pressure Injury Status: Date Acquired: 07/29/2018 Comorbid Cataracts, Glaucoma, Coronary Artery Disease, Weeks Of Treatment: 0 History: Hypertension, Myocardial Infarction, Type II Clustered Wound: No Diabetes Photos Wound Measurements Length: (cm) 1.5 % Reduction in Width: (cm) 4.5 % Reduction in Depth: (cm) 0.6 Epithelializat Area: (cm) 5.301 Tunneling: Volume: (cm) 3.181 Undermining: Area: 0% Volume: 0% ion: None No No Wound Description Classification: Category/Stage II Foul Odor Aft Exudate Amount: Medium Slough/Fibrin Exudate Type: Purulent Exudate Color: yellow, brown, green er Cleansing: No o Yes Wound Bed Granulation Amount: Small (1-33%) Exposed Structure Granulation Quality: Pink Fascia Exposed: No Necrotic Amount: Large (67-100%) Fat Layer (Subcutaneous Tissue) Exposed: Yes Necrotic Quality: Adherent Slough Tendon Exposed: No Muscle Exposed: No Joint Exposed: No Bone Exposed: No Electronic Signature(s) Signed: 08/20/2018 3:33:36 PM By: Erline Hau, Ashley F. ([id number]) Entered By: Army Melia on 08/20/2018 10:47:50 Dolin, Cynthis F. ([id number]) -------------------------------------------------------------------------------- Wound Assessment Details Patient Name: Abigail Boyd. Date of Service: 08/20/2018 9:45 AM Medical Record Number: [id number] Patient Account Number: [id number] Date of Birth/Sex: 06-Aug-1929 (85 y.o. F) Treating RN: Army Melia Primary Care Ahmoni Edge: Frazier Richards Other Clinician: Referring Oluwadarasimi Favor: Frazier Richards Treating Beya Tipps/Extender: Melburn Hake, HOYT Weeks in Treatment: 0 Wound Status Wound Number: 3 Primary Pressure Ulcer Etiology: Wound Location: Left Metatarsal head first Wound Open Wounding  Event: Pressure Injury Status: Date Acquired: 07/29/2018 Comorbid Cataracts, Glaucoma, Coronary Artery Disease, Weeks Of Treatment: 0 History: Hypertension, Myocardial Infarction, Type II Clustered Wound: No Diabetes Photos Wound Measurements Length: (cm) 1 % Reduction i Width: (cm) 1 % Reduction i Depth: (cm) 0.1 Epithelializa Area: (cm) 0.785 Tunneling: Volume: (cm) 0.079 Undermining: n Area: 0% n Volume: 0% tion: None No No Wound Description Classification: Category/Stage II Foul Odor Af Exudate Amount: None Present Slough/Fibri ter Cleansing: No no No Wound Bed Granulation Amount: None Present (0%) Exposed Structure Necrotic Amount: Medium (34-66%) Fascia Exposed: No Necrotic Quality: Eschar Fat Layer (Subcutaneous Tissue) Exposed: No Tendon Exposed: No Muscle Exposed: No Joint Exposed: No Bone Exposed: No Limited to Skin Breakdown Electronic Signature(s) Signed: 08/20/2018 3:33:36 PM By: Erline Hau, Ricky F. ([id number]) Entered By: Army Melia on 08/20/2018 10:49:37 Garofano, Dao F. ([id number]) -------------------------------------------------------------------------------- Vitals Details Patient Name: Abigail Aase F. Date of Service: 08/20/2018 9:45 AM Medical Record Number: [id number] Patient Account Number: [id number] Date of Birth/Sex: Jul 09, 1929 (85 y.o. F) Treating RN: Army Melia Primary Care Dougles Kimmey: Frazier Richards Other Clinician: Referring Damonica Chopra: Frazier Richards Treating Eddi Hymes/Extender: Melburn Hake, HOYT Weeks in Treatment: 0 Vital Signs Time Taken: 10:00 Temperature (F): 98.3 Height (in): 64 Pulse (bpm): 70 Source: Stated Respiratory Rate (breaths/min): 16 Weight (lbs): 144 Blood Pressure (mmHg): 100/45 Source: Stated Reference Range: 80 - 120 mg / dl Body Mass Index (BMI): 24.7 Electronic Signature(s) Signed: 08/20/2018 3:33:36 PM By: Army Melia Entered By: Army Melia on 08/20/2018 10:01:14

## 2018-08-23 NOTE — Consult Note (Signed)
Consultation Note Date: 08/23/2018   Patient Name: Abigail Boyd  DOB: Jun 15, 1929  MRN: [id number]  Age / Sex: 85 y.o., female   PCP: Kirk Ruths, MD Referring Physician: [name], *   REASON FOR CONSULTATION:Establishing goals of care  Palliative Care consult requested for this 85 y.o. female with multiple medical problems including coronary artery disease status post stent, chronic kidney disease stage II, diabetes, hypertension, stroke, asthma, and paroxysmal atrial fibrillation. She presented to ED after being evaluated at the wound care clinic due to right heel ulceration. Foot x-ray showed suspected osteomyelitis. MRI later confirmed. She has been seen by Podiatry and Vascular surgery. She is undergoing an angiogram and angioplasty today per Vascular. Consultation received for goals of care discussion.   Clinical Assessment and Goals of Care: I have reviewed medical records including lab results, imaging, Epic notes, and MAR, received report from the bedside RN, and assessed the patient. I spoke with daughter, Abigail Boyd via phone due to COVID-19 visitation restrictions. We discussed patient's diagnosis prognosis, GOC, EOL wishes, disposition and options. On previous assessment patient was confused with occasional crying episodes. She is unable to engage in goals of care discussion.   I introduced Palliative Medicine as specialized medical care for people living with serious illness. It focuses on providing relief from the symptoms and stress of a serious illness. The goal is to improve quality of life for both the patient and the family. Daughter shared she is familiar with our team and outpatient palliative due to previous interactions while hospitalized. She expressed her mother was receiving outpatient Palliative at [facility] but was services were discontinued once she transferred to [facility].   We discussed a brief life review of the patient, along  with her functional and nutritional status. Abigail Boyd is the only child and patient is a widow. Her daughter shares that patient was living alone until she suffered a stroke in 2016 and then moved in with her. After continuous health decline patient became a long-term resident at [facility] and in April 2020 she transferred to [facility].   Daughter reports prior to admission patient had a good appetite and no bedsores before going to [facility]. She states she was alert and oriented and able to engage in appropriate discussions. She reports 3 months ago patient was ambulatory with a walker and able to feed herself. She shares her concerns of her mothers extensive foot ulcerations. She reports facility made it seem less than what it actually was and suggested no additional care was needed. Daughter reports since COVID-19 she has been concerned as she and other family members have been unable to visit her mother.   We discussed Her current illness and what it means in the larger context of Her on-going co-morbidities. With specific discussions regarding her altered mental status, diabetes, CAD, and osteomyelitis.   Natural disease trajectory and expectations at EOL were discussed. Daughter is tearful expressing awareness of patient's condition and her concern that she was made to believe her mother's condition was not as severe as it has been presented in the hospital.   Daughter reports her noticing a decline in her mother over the past 2 weeks. She seemed more lethargic, she had noticed a huge amount of weight loss, and occasional episodes of crying. I shared this has also been witnessed while she hs been hospitalized. Daughter reports she did not know or had not seen her wound due to not being able to visit. Now  knowing her weight she reports she has loss almost 60lbs in the past 6 months.   She is aware of her procedure today and remains hopeful for a good outcome. I discussed with her in details  possibility of needing an AKA or debridement per notations. She is tearful in stating she is unsure she would want to put her mother through extensive treatments as discussed given her age and concerns for her recovery. Support given.    I attempted to elicit values and goals of care important to the patient.    The difference between aggressive medical intervention and comfort care was considered in light of the patient's goals of care. Daughter remains hopeful for improvement however is preparing for the worst. She is requesting the approval to visit her mother in the event further decisions regarding her care is needed. She reports she would like to see her with her daughter as she is her major support and helps her with decisions, to better have a understanding of how she is doing and how they wish to proceed with her care. She is tearful in stating "I don't want her suffering or going through anything that is only going to make her suffer even more while trying to recover!" Support provided.   Patient does have a documented advanced directive. Her daughter Abigail Boyd is her Scientist, research (medical). Abigail Boyd confirms wishes for DNR/DNI. She shares she is not sure they would be interested in any forms of artifical feedings if patient is not able to maintain appropriate nutritional requirements.   Hospice and Palliative Care services outpatient were explained and offered. Daughter verbalized her understanding and awareness of both palliative and hospice's goals and philosophy of care. At this time she is requesting outpatient palliative to be re-started at discharge but again would like to evaluate her current condition face to face and receive further updates and long-term prognosis from providers to determine how to best proceed. She expressed if hospice becomes most appropriate she is open.    Questions and concerns were addressed.  The family was encouraged to call with questions or concerns.  PMT will  continue to support holistically.  SOCIAL HISTORY:     reports that she has never smoked. She has never used smokeless tobacco. She reports that she does not drink alcohol or use drugs.  CODE STATUS: DNR  ADVANCE DIRECTIVES: Abigail Boyd, Daughter/POA   SYMPTOM MANAGEMENT: per attending   Palliative Prophylaxis:   Aspiration, Bowel Regimen, Delirium Protocol, Frequent Pain Assessment, Oral Care, Palliative Wound Care and Turn Reposition  PSYCHO-SOCIAL/SPIRITUAL:  Support System: Family   Desire for further Chaplaincy support: No   Additional Recommendations (Limitations, Scope, Preferences):  Full Scope Treatment   PAST MEDICAL HISTORY: Past Medical History:  Diagnosis Date  . Abnormal nuclear stress test 06/21/2011   Inferolateral reversible defect;--> cardiac cath & PCI of CxOM, occluded RCA with collaterals;; followup Myoview 11/2012: Low risk. Fixed basal inferior artifact normal EF. No ischemia  . Anxiety   . Asthma   . Bilateral cataracts    Status post stroke or correction  . CAD S/P percutaneous coronary angioplasty 06/2011   s/P PCI to proximal OM1 w/ DES; occluded RCA with bridging and L-R collaterals (medical management)  . Chronic kidney disease (CKD), stage II (mild)    Related to current bladder infections (although diabetes cannot be excluded)  . Chronotropic incompetence with sinus node dysfunction [facility]) October 2013   On CPET test; beta blockers reduced  . Diabetes mellitus type  2, controlled ([place])    On oral medications  . Diverticulitis   . Essential hypertension    Allowing for permissive hypertension to avoid orthostatic hypotension  . GERD (gastroesophageal reflux disease)    On PPI  . Glaucoma   . History of syncope    Per EP - neurocardiogenic & not Bradycardia related (no PPM)  . History of unstable angina 06/13/2011   Jaw pain awakening from sleep -- Myoview --CATH --> PCI  . Hyperlipidemia with target LDL less than 70    HDL at goal,  LDL not at goal, borderline triglycerides. On Crestor 20 mg  . Migraines   . OSA (obstructive sleep apnea)    hx bladder infections  . Osteoarthritis   . PAF (paroxysmal atrial fibrillation) ([place]) 10-11/ 2014   CardioNet Event Monitor: NSR & S Brady -- Rates 50-100; Total A. fib burden 35 hours and 27 minutes. 1296 episodes, longest was 1 hour 29 minutes. Rate ranged from 52-169 beats per minute.  . Seasonal allergies   . Shortness of breath on exertion October 2013   2-D echo: Normal EF>55%, Gr 1DD, mild aortic sclerosis; Evaluated with CPET - peak VO2 97%; Chronotropic Incompetence (submaximal effort)  . Spinal stenosis of lumbar region 11/2011  . Stroke ([place]) 06/2017  . Stroke ([place]) 11/2014  . Tachycardia-bradycardia syndrome ([place]) 11/2012  . Urge incontinence     PAST SURGICAL HISTORY:  Past Surgical History:  Procedure Laterality Date  . APPENDECTOMY    . BREAST BIOPSY     both breast  . cataracts     both eyes  . COLONOSCOPY    . CORONARY ANGIOPLASTY WITH STENT PLACEMENT  07/05/2011   Promus Element DES 2.5 mm x 16 mm-post dilated to 2.65 mm CX-Proximal OM1  . Holter Monitor  04/2015   Sinus rhythm with rates 52-149 BPM. Isolated PACs with rare couplets and bigeminy. Multiple short runs of PAT/PSVT. Arrhythmia run 120 bpm for 204 beats. 14% of time was in A. fib/flutter. - Reviewed by Dr. Caryl Comes. Not thought to be significant enough for her syncope.  Marland Kitchen KNEE ARTHROSCOPY WITH MEDIAL MENISECTOMY Right 06/24/2014   Procedure: RIGHT KNEE ARTHROSCOPY WITH partial lateral MENISECTOMY, abrasion chondroplasty of medial femoral condyle and patella, microfracture technique;  Surgeon: Latanya Maudlin, MD;  Location: [facility]S;  Service: Orthopedics;  Laterality: Right;  . LEFT HEART CATHETERIZATION WITH CORONARY ANGIOGRAM N/A 07/06/2011   Procedure: LEFT HEART CATHETERIZATION WITH CORONARY ANGIOGRAM;  Surgeon: Leonie Man, MD;  [facility] CATH LAB: Unstable Angina --> Myoview wiht Inf-Lat  Ischemia.  Proximal OM1 lesion --> PCI; 100% mid RCA with right to right and left to right bridging collaterals from circumflex RPL and LAD the PDA.  Marland Kitchen NM MYOVIEW LTD  October 2014    Low risk. Fixed basal inferior artifact normal EF. No ischemia --> (as compared to pre-PCI Myoview revealing inferolateral ischemia)  . rotator cuff Right   . TRANSTHORACIC ECHOCARDIOGRAM  11/2014   EF 65-70%, Mod LVH. Normal wall motion. Gr 1 DD. Normal valves.  Marland Kitchen VAGINAL HYSTERECTOMY      ALLERGIES:  is allergic to amlodipine; clopidogrel bisulfate; kenalog [triamcinolone acetonide]; lisinopril; losartan; and sulfa antibiotics.   MEDICATIONS:  Current Facility-Administered Medications  Medication Dose Route Frequency Provider Last Rate Last Dose  . [MAR Hold] 0.9 %  sodium chloride infusion   Intravenous Once Duffy Bruce, MD      . 0.9 %  sodium chloride infusion   Intravenous Continuous Stegmayer, Janalyn Harder,  PA-C 75 mL/hr at 08/23/18 0040    . [MAR Hold] ceFEPIme (MAXIPIME) 2 g in sodium chloride 0.9 % 100 mL IVPB  2 g Intravenous Q12H Rocky Morel, [facility]   Stopped at 08/22/18 2229  . [MAR Hold] docusate sodium (COLACE) capsule 100 mg  100 mg Oral Daily [name], MD   100 mg at 08/21/18 0847  . [MAR Hold] docusate sodium (COLACE) capsule 100 mg  100 mg Oral BID PRN [name], MD      . Doug Sou Hold] enoxaparin (LOVENOX) injection 65 mg  1 mg/kg Subcutaneous Q12H Rocky Morel, RPH   65 mg at 08/22/18 1321  . [MAR Hold] feeding supplement (ENSURE ENLIVE) (ENSURE ENLIVE) liquid 237 mL  237 mL Oral BID BM [name], MD   237 mL at 08/22/18 1442  . fentaNYL (SUBLIMAZE) 100 MCG/2ML injection           . fentaNYL (SUBLIMAZE) injection    PRN Delana Meyer Dolores Lory, MD   25 mcg at 08/23/18 [phone]  . [MAR Hold] FLUoxetine (PROZAC) capsule 40 mg  40 mg Oral Daily [name], MD   40 mg at 08/22/18 0919  . [MAR Hold] furosemide (LASIX) tablet 20 mg  20 mg Oral Daily  [name], MD   20 mg at 08/22/18 0919  . [MAR Hold] gabapentin (NEURONTIN) capsule 300 mg  300 mg Oral TID [name], MD   300 mg at 08/22/18 2145  . heparin 1000 UNIT/ML injection           . [MAR Hold] insulin aspart (novoLOG) injection 0-5 Units  0-5 Units Subcutaneous QHS [name], MD      . Doug Sou Hold] insulin aspart (novoLOG) injection 0-9 Units  0-9 Units Subcutaneous TID WC [name], MD   3 Units at 08/21/18 1742  . [MAR Hold] insulin glargine (LANTUS) injection 55 Units  55 Units Subcutaneous QHS [name], MD   Stopped at 08/22/18 2138  . [MAR Hold] latanoprost (XALATAN) 0.005 % ophthalmic solution 1 drop  1 drop Both Eyes QHS [name], MD   1 drop at 08/22/18 2146  . [MAR Hold] levETIRAcetam (KEPPRA) tablet 500 mg  500 mg Oral BID [name], MD   500 mg at 08/22/18 2145  . lidocaine (PF) (XYLOCAINE) 1 % injection           . [MAR Hold] metoprolol tartrate (LOPRESSOR) tablet 12.5 mg  12.5 mg Oral Daily [name], MD   12.5 mg at 08/22/18 0918  . midazolam (VERSED) 2 MG/2ML injection           . midazolam (VERSED) injection    PRN Schnier, Dolores Lory, MD   0.5 mg at 08/23/18 0843  . [MAR Hold] morphine 2 MG/ML injection 1 mg  1 mg Intravenous Q4H PRN [name], MD      . Doug Sou Hold] multivitamin with minerals tablet 1 tablet  1 tablet Oral Daily [name], MD   1 tablet at 08/22/18 [phone]  . [MAR Hold] nitroGLYCERIN (NITROSTAT) SL tablet 0.4 mg  0.4 mg Sublingual Q5 min PRN [name], MD      . Doug Sou Hold] ondansetron [facility]) injection 4 mg  4 mg Intravenous Q6H PRN Harrie Foreman, MD   4 mg at 08/22/18 0453  . [MAR Hold] oxybutynin (DITROPAN) tablet 5 mg  5 mg Oral BID [name], MD   5 mg at 08/22/18 2146  . [MAR Hold] oxyCODONE-acetaminophen (PERCOCET/ROXICET) 5-325 MG per tablet 1  tablet  1 tablet Oral Q6H PRN [name], MD   1 tablet at 08/22/18 0456  . [MAR Hold] pantoprazole (PROTONIX) EC tablet 40 mg  40 mg Oral Daily [name], MD   40 mg at 08/22/18 0918  . [MAR Hold] polyethylene glycol (MIRALAX / GLYCOLAX) packet 17 g  17 g Oral Daily [name], MD      . Doug Sou Hold] rOPINIRole (REQUIP) tablet 0.5 mg  0.5 mg Oral QHS [name], MD   0.5 mg at 08/22/18 2144  . [MAR Hold] senna (SENOKOT) tablet 17.2 mg  2 tablet Oral QHS [name], MD   17.2 mg at 08/22/18 2145  . [MAR Hold] timolol (TIMOPTIC) 0.5 % ophthalmic solution 1 drop  1 drop Both Eyes QHS [name], MD   1 drop at 08/22/18 2147  . [MAR Hold] vancomycin (VANCOCIN) IVPB 750 mg/150 ml premix  750 mg Intravenous Q24H Rocky Morel, [facility]   Stopped at 08/22/18 1433  . [MAR Hold] vitamin C (ASCORBIC ACID) tablet 250 mg  250 mg Oral BID [name], MD   250 mg at 08/22/18 2145    VITAL SIGNS: BP (!) 168/78   Pulse 77   Temp 98.5 F (36.9 C) (Oral)   Resp 13   Ht 5\' 4"  (1.626 m)   Wt 65.3 kg   SpO2 100%   BMI 24.71 kg/m  Filed Weights   08/20/18 1303 08/23/18 0804  Weight: 65.3 kg 65.3 kg    Estimated body mass index is 24.71 kg/m as calculated from the following:   Height as of this encounter: 5\' 4"  (1.626 m).   Weight as of this encounter: 65.3 kg.  LABS: CBC:    Component Value Date/Time   WBC 12.0 (H) 08/23/2018 0515   HGB 9.6 (L) 08/23/2018 0515   HGB 11.0 (L) 01/27/2015 1609   HCT 29.8 (L) 08/23/2018 0515   HCT 36.2 07/22/2017 0026   PLT 424 (H) 08/23/2018 0515   PLT 262 01/27/2015 1609   Comprehensive Metabolic Panel:    Component Value Date/Time   NA 137 08/23/2018 0515   NA 140 04/22/2015 1451   K 3.6 08/23/2018 0515   CO2 24 08/23/2018 0515   BUN 16 08/23/2018 0515   BUN 26 04/22/2015 1451   CREATININE 0.90 08/23/2018 0515   ALBUMIN 2.8 (L) 08/20/2018 1307     Review of Systems  Unable to perform ROS: Acuity of condition   Confused   Physical Exam General: NAD, chronically-ill appearing Cardiovascular: regular rate and rhythm Pulmonary: clear ant fields, diminished bases Abdomen: soft, nontender, + bowel sounds GU: no suprapubic tenderness Extremities: no edema Skin: no rashes, right heel ulceration (not assessed) dressing intact Neurological: Weakness, awake, alert, confused, episodes of loud crying    Prognosis: Guarded to Poor in the setting of osteomyelitis, deconditioning, wheelchair bound, confusion, decreased po intake, diabetes, CKD stage III, a-fib, CAD, hypertension, CVA  Discharge Planning:  To Be Determined  Recommendations:  DNR/DNI-as confirmed by daughter/poa  Continue with current plan of care per attending  Daughter remains hopeful but also preparing for worst. Reports she does not want her mother to suffer or be in pain, unsure if recommended that she would want to proceed with aggressive interventions such as debridement or AKA with worries of her recovery and pain. Requesting to visit and evaluate her mother face to face and gain better understanding and peace in case decisions are to be made.   Discussed with Dr. [place].  Daughter, Abigail Boyd and granddaughter Abigail Boyd approved to visit due to decision making needs.   Outpatient palliative at minimum.   PMT will continue to follow and support. No weekend coverage. Will follow up Monday if she remains hospitalized.    Palliative Performance Scale: PPS 30%               Daughter expressed understanding and was in agreement with this plan.   The above conversation was completed via telephone due to the visitor restrictions during the COVID-19 pandemic. Thorough chart review and discussion with necessary members of the care team was completed as part of assessment.   Thank you for allowing the Palliative Medicine Team to assist in the care of this patient.  Time In: 1100 Time Out: 1215 Time Total: 75 min    Visit consisted of counseling and education dealing with the complex and emotionally intense issues of symptom management and palliative care in the setting of serious and potentially life-threatening illness.Greater than 50%  of this time was spent counseling and coordinating care related to the above assessment and plan.  Signed by:  Alda Lea, AGPCNP-BC Palliative Medicine Team  Phone: [phone] Fax: [phone] Pager: [phone] Amion: Bjorn Pippin

## 2018-08-23 NOTE — Progress Notes (Signed)
Patient clinically stable post angiogram per Dr Delana Meyer. Left groin without visible bleeding nor hematoma. Called report to [employer] with questions answered. Sinus rhythm per monitor.

## 2018-08-23 NOTE — Consult Note (Signed)
ANTICOAGULATION CONSULT NOTE - Follow Up Consult  Pharmacy Consult for Enoxaparin  Indication: atrial fibrillation  (on Xarelto PTA)  Allergies  Allergen Reactions  . Amlodipine Cough  . Clopidogrel Bisulfate Cough  . Kenalog [Triamcinolone Acetonide] Other (See Comments)    unknown  . Lisinopril Cough  . Losartan Cough  . Sulfa Antibiotics Hives and Rash    Patient Measurements: Height: 5\' 4"  (162.6 cm) Weight: 143 lb 15.4 oz (65.3 kg) IBW/kg (Calculated) : 54.7   Vital Signs: Temp: 98.5 F (36.9 C) (06/26 0804) Temp Source: Oral (06/26 0804) BP: 152/74 (06/26 1005) Pulse Rate: 70 (06/26 1005)  Labs: Recent Labs    08/20/18 1307 08/20/18 1803 08/21/18 0338 08/22/18 0550 08/23/18 0515  HGB 10.5*  --  8.7*  --  9.6*  HCT 33.7*  --  27.1*  --  29.8*  PLT 509*  --  389  --  424*  APTT  --  42*  --   --   --   CREATININE 1.24*  --  0.83 0.89 0.90    Estimated Creatinine Clearance: 38.7 mL/min (by C-G formula based on SCr of 0.9 mg/dL).   Medications:  Xarelto last given on 08/19/18 @ 1800  Assessment: Patient is a resident a [facility] who presents with a wound infection. Xarelto is being held for possible foot surgery. CrCl < 30 mL/min  6/26: SCr 0.9 CrCl 38.7 Ml/min  TBW Crcl 46.2 ml/min  Goal of Therapy:  Monitor platelets by anticoagulation protocol: Yes   Plan:   Enoxaparin 1 mg/kg SQ  q12h.  Will need SCr and CBC at least every three days per policy.   Pharmacy will continue to follow.   Genie Wenke A 08/23/2018,10:36 AM

## 2018-08-23 NOTE — Op Note (Signed)
[facility] Percutaneous Study/Intervention Procedural Note   Date of Surgery: 08/23/2018  Surgeon: Hortencia Pilar  Pre-operative Diagnosis: Atherosclerotic occlusive disease bilateral lower extremities with right lower extremity ulceration and gangrene of the heel  Post-operative diagnosis: Same  Procedure(s) Performed: 1. Introduction catheter into right lower extremity 3rd order catheter placement  2. Contrast injection right lower extremity for distal runoff  3. Percutaneous transluminal angioplasty to 4 mm with Lutonix drug-eluting balloon right superficial femoral and popliteal arteries. 4. Percutaneous transluminal angioplasty right peroneal to 2.5 mm with an ultra score balloon  5. Star close closure left common femoral arteriotomy  Anesthesia: Conscious sedation was administered under my direct supervision by the interventional radiology RN. IV Versed plus fentanyl were utilized. Continuous ECG, pulse oximetry and blood pressure was monitored throughout the entire procedure.  Conscious sedation was for a total of 1 hour 17 minutes.  Sheath: 6 French Raby  Contrast: 90 cc  Fluoroscopy Time: 12.9 minutes  Indications: Abigail Boyd presents with increasing pain of the right lower extremity.  She has a large right heel ulceration.  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  This suggests the patient is having limb threatening ischemia. The risks and benefits are reviewed all questions answered patient's daughter agrees for [place] to proceed.  Procedure:Abigail Boyd is a 85 y.o. y.o. female who was identified and appropriate procedural time out was performed. The patient was then placed supine on the table and prepped and draped in the usual sterile fashion.   Ultrasound was placed in the sterile sleeve and the left groin was evaluated  the left common femoral artery was echolucent and pulsatile indicating patency. Image was recorded for the permanent record and under real-time visualization a microneedle was inserted into the common femoral artery followed by the microwire and then the micro-sheath. A J-wire was then advanced through the micro-sheath and a 5 [place] sheath was then inserted over a J-wire. J-wire was then advanced and a 5 French pigtail catheter was positioned at the level of T12.  AP projection of the aorta was then obtained. Pigtail catheter was repositioned to above the bifurcation and a LAO view of the pelvis was obtained. Subsequently a rim catheter with the stiff angle Glidewire was used to cross the aortic bifurcation the catheter wire were advanced down into the right distal external iliac artery. Oblique view of the femoral bifurcation was then obtained and subsequently the wire was reintroduced and the pigtail catheter negotiated into the SFA representing third order catheter placement. Distal runoff was then performed.  Diagnostic interpretation: The abdominal aorta is opacified with a bolus injection of contrast.  Bilateral nephrograms are noted normal in size.  Bilateral renal arteries are noted there is mild to moderate stenosis at the origin of the right renal artery left renal artery is widely patent.  Bilateral common and external iliac arteries demonstrate mild atherosclerotic changes but are widely patent.  The right common femoral and profunda femoris are patent profunda femoris is somewhat diminutive but I do not identify any hemodynamically significant lesions.  The SFA is patent in its proximal two thirds.  At [facility] canal there is a 60% stenosis extending into the above-knee popliteal.  Mid and below-knee popliteal are diffusely diseased but there are no hemodynamically significant lesions.  The trifurcation demonstrates patency of the anterior tibial at its origin and tibioperoneal trunk.  The  posterior tibial is occluded at its origin remains  occluded down to the level of the ankle where it is reconstituted by a large collateral from the peroneal.  The peroneal is patent in its origin there are 3 tandem lesions within the peroneal all greater than 90%.  Distally the peroneal appears widely patent and as noted has large collateral that extends to the dorsum of the foot and a large collateral that extends directly to the distal posterior tibial reconstitution.  Anterior tibial is patent in its origin in its midportion it demonstrates increasing disease with greater than 80% stenosis throughout its distal one third.  Based on these findings I elected to move forward with intervention.  5000 units of heparin was then given and allowed to circulate and a 6 [place] Raby sheath was advanced up and over the bifurcation and positioned in the femoral artery  Kumpe catheter and advantage wire were then negotiated down into the distal popliteal. Catheter was then advanced. Hand injection contrast demonstrated the tibial anatomy in detail.  The wire and catheter were then negotiated into the peroneal and the advantage wire exchanged for a 0.014 whisper wire.  A 2.5 x 200 mm ultra score balloon was used to angioplasty the peroneal.  The inflation was for 1 minute at 8 atm. Follow-up imaging demonstrated excellent patency with preservation of the distal runoff.  The detector was then repositioned and the SFA and popliteal was reimaged the stenosis at [place]'s canal is noted in this area and after appropriate measurements are made a 4 x 60 Lutonix balloon was used to angioplasty the superficial femoral and popliteal arteries. Inflations were to 12 atmospheres for 2 minutes. Follow-up imaging demonstrated patency with excellent result. Distal runoff was then reassessed and noted to be widely patent.   After review of these images the sheath is pulled into the left external iliac oblique of the common femoral is  obtained and a Star close device deployed. There no immediate Complications.  Findings:  The abdominal aorta is opacified with a bolus injection of contrast.  Bilateral nephrograms are noted normal in size.  Bilateral renal arteries are noted there is mild to moderate stenosis at the origin of the right renal artery left renal artery is widely patent.  Bilateral common and external iliac arteries demonstrate mild atherosclerotic changes but are widely patent.  The right common femoral and profunda femoris are patent profunda femoris is somewhat diminutive but I do not identify any hemodynamically significant lesions.  The SFA is patent in its proximal two thirds.  At [facility] canal there is a 60% stenosis extending into the above-knee popliteal.  Mid and below-knee popliteal are diffusely diseased but there are no hemodynamically significant lesions.  The trifurcation demonstrates patency of the anterior tibial at its origin and tibioperoneal trunk.  The posterior tibial is occluded at its origin remains occluded down to the level of the ankle where it is reconstituted by a large collateral from the peroneal.  The peroneal is patent in its origin there are 3 tandem lesions within the peroneal all greater than 90%.  Distally the peroneal appears widely patent and as noted has large collateral that extends to the dorsum of the foot and a large collateral that extends directly to the distal posterior tibial reconstitution.  Anterior tibial is patent in its origin in its midportion it demonstrates increasing disease with greater than 80% stenosis throughout its distal one third.  Following angioplasty peroneal artery now is in-line flow and looks quite nice with less than 5% residual stenosis. Angioplasty of  the SFA at Hunter's canal yields an excellent result with less than 5% residual stenosis.  Summary: Successful recanalization right lower extremity for limb  salvage   Disposition: Patient was taken to the recovery room in stable condition having tolerated the procedure well.  Abigail Boyd 08/23/2018,9:54 AM

## 2018-08-23 NOTE — Progress Notes (Signed)
[name], [name] ([id number]) Visit Report for 08/20/2018 Chief Complaint Document Details Patient Name: Abigail Boyd, Abigail Boyd. Date of Service: 08/20/2018 9:45 AM Medical Record Number: [id number] Patient Account Number: [id number] Date of Birth/Sex: 1929-10-31 (85 y.o. Boyd) Treating RN: Montey Hora Primary Care Provider: Frazier Richards Other Clinician: Referring Provider: Frazier Richards Treating Provider/Extender: Melburn Hake, Brodric Schauer Weeks in Treatment: 0 Information Obtained from: Patient Chief Complaint Multiple pressure ulcers Electronic Signature(s) Signed: 08/21/2018 10:45:49 PM By: Worthy Keeler PA-C Entered By: Worthy Keeler on 08/20/2018 10:55:30 Pruden, [place] Boyd. ([id number]) -------------------------------------------------------------------------------- HPI Details Patient Name: Abigail Boyd. Date of Service: 08/20/2018 9:45 AM Medical Record Number: [id number] Patient Account Number: [id number] Date of Birth/Sex: [birth year] (85 y.o. Boyd) Treating RN: Montey Hora Primary Care Provider: Frazier Richards Other Clinician: Referring Provider: Frazier Richards Treating Provider/Extender: Melburn Hake, Orry Sigl Weeks in Treatment: 0 History of Present Illness HPI Description: 08/20/18 on evaluation today patient presents for initial evaluation our clinic concerning issues that she's had with wound over the sacral region as well as a wound on the right heel. This apparently has been going on for several weeks although being a resident at [facility] patient's daughter tells me she has not been able to see her for some time. Subsequently she was told that her mother had a blister on her heel she did not expect to see what she saw today. Nonetheless she does seem to have a significant area of eschar with evidence of gangrene and the x-ray which was performed on 08/13/18 showed evidence of gangrene as well and further evaluation with MRI or otherwise was recommended here. The patient was  placed on an antibiotic although I do not see that in the record the daughter was aware of this. Subsequently she is no longer on this apparently at least according to what I see in the record. There does not appear to be any signs of systemic infection although she tells me that her mother seems to be much less active although she is alert. The patient is nonverbal secondary to multiple factors including what appears to be dementia as well as stroke. She does not really contribute anything to the history or physical exam portion of the evaluation today. Pretty much all the information is obtained from records and the daughter. Electronic Signature(s) Signed: 08/21/2018 10:45:49 PM By: Worthy Keeler PA-C Entered By: Worthy Keeler on 08/21/2018 22:30:46 Tyndall, Abigail Boyd ([id number]) -------------------------------------------------------------------------------- Physical Exam Details Patient Name: Abigail Boyd. Date of Service: 08/20/2018 9:45 AM Medical Record Number: [id number] Patient Account Number: [id number] Date of Birth/Sex: 1929/11/21 (85 y.o. Boyd) Treating RN: Montey Hora Primary Care Provider: Frazier Richards Other Clinician: Referring Provider: Frazier Richards Treating Provider/Extender: Melburn Hake, Brahim Dolman Weeks in Treatment: 0 Constitutional sitting or standing blood pressure is within target range for patient.. pulse regular and within target range for patient.Marland Boyd respirations regular, non-labored and within target range for patient.Marland Boyd temperature within target range for patient.. Well- nourished and well-hydrated in no acute distress. Eyes conjunctiva clear no eyelid edema noted. pupils equal round and reactive to light and accommodation. Ears, Nose, Mouth, and Throat no gross abnormality of ear auricles or external auditory canals. normal hearing noted during conversation. mucus membranes moist. Respiratory normal breathing without difficulty. clear to auscultation  bilaterally. Cardiovascular regular rate and rhythm with normal S1, S2. no clubbing, cyanosis, significant edema, <3 sec cap refill. Gastrointestinal (GI) soft, non-tender, non-distended, +BS. no ventral hernia noted. Musculoskeletal Patient unable to walk without assistance. Psychiatric Patient is  not able to cooperate in decision making regarding care. Patient has dementia. pleasant and cooperative. Notes Patient appears to be alert and oriented however she does have a significant wound of the right heel which does appear to be gangrenous at this time. I feel like this likely extends down to bone although the x-ray was not completely in 100% clear on this fact I believe that that is very likely. At least based on what I'm seeing at this time. Nonetheless I do feel like that the patient has been treated with antibiotics I'm just not sure that antibiotics are sufficient to completely take care of this there still in your theme and want noted unfortunately. Electronic Signature(s) Signed: 08/21/2018 10:45:49 PM By: Worthy Keeler PA-C Entered By: Worthy Keeler on 08/21/2018 22:31:45 Dapper, Amrita FMarland Boyd ([id number]) -------------------------------------------------------------------------------- Physician Orders Details Patient Name: Abigail Boyd. Date of Service: 08/20/2018 9:45 AM Medical Record Number: [id number] Patient Account Number: [id number] Date of Birth/Sex: March 29, 1929 (85 y.o. Boyd) Treating RN: Montey Hora Primary Care Provider: Frazier Richards Other Clinician: Referring Provider: Frazier Richards Treating Provider/Extender: Melburn Hake, Suhayla Chisom Weeks in Treatment: 0 Verbal / Phone Orders: No Diagnosis Coding ICD-10 Coding Code Description L89.610 Pressure ulcer of right heel, unstageable L89.153 Pressure ulcer of sacral region, stage 3 L89.893 Pressure ulcer of other site, stage 3 N18.2 Chronic kidney disease, stage 2 (mild) R47.9 Unspecified speech disturbances I10  Essential (primary) hypertension Z79.01 Long term (current) use of anticoagulants Wound Cleansing Wound #1 Right Calcaneus o Clean wound with Normal Saline. Wound #2 Midline Sacrum o Clean wound with Normal Saline. Wound #3 Left Metatarsal head first o Clean wound with Normal Saline. Primary Wound Dressing Wound #1 Right Calcaneus o Silver Alginate Wound #2 Midline Sacrum o Silver Alginate Wound #3 Left Metatarsal head first o Other: - paint with betadine Secondary Dressing Wound #2 Midline Sacrum o Boardered Foam Dressing Wound #1 Right Calcaneus o Conform/Kerlix o Foam - foam heel cup for protection Dressing Change Frequency Wound #1 Right Calcaneus o Change dressing every day. AREN, CHERNE Boyd. ([id number]) Wound #2 Midline Sacrum o Change dressing every day. Wound #3 Left Metatarsal head first o Change dressing every day. Follow-up Appointments Wound #1 Right Calcaneus o Return Appointment in 1 week. Wound #2 Midline Sacrum o Return Appointment in 1 week. Wound #3 Left Metatarsal head first o Return Appointment in 1 week. Off-Loading Wound #1 Right Calcaneus o Mattress - air mattress Wound #2 Midline Sacrum o Mattress - air mattress Wound #3 Left Metatarsal head first o Mattress - air mattress Electronic Signature(s) Signed: 08/20/2018 4:25:12 PM By: Montey Hora Signed: 08/21/2018 10:45:49 PM By: Worthy Keeler PA-C Entered By: Montey Hora on 08/20/2018 11:19:48 Skaff, Cele Boyd. ([id number]) -------------------------------------------------------------------------------- Problem List Details Patient Name: Abigail Boyd. Date of Service: 08/20/2018 9:45 AM Medical Record Number: [id number] Patient Account Number: [id number] Date of Birth/Sex: [birth year] (85 y.o. Boyd) Treating RN: Montey Hora Primary Care Provider: Frazier Richards Other Clinician: Referring Provider: Frazier Richards Treating Provider/Extender: Melburn Hake, Wacey Zieger Weeks in Treatment: 0 Active Problems ICD-10 Evaluated Encounter Code Description Active Date Today Diagnosis L89.610 Pressure ulcer of right heel, unstageable 08/20/2018 No Yes L89.153 Pressure ulcer of sacral region, stage 3 08/20/2018 No Yes L89.893 Pressure ulcer of other site, stage 3 08/20/2018 No Yes N18.2 Chronic kidney disease, stage 2 (mild) 08/20/2018 No Yes R47.9 Unspecified speech disturbances 08/20/2018 No Yes I10 Essential (primary) hypertension 08/20/2018 No Yes Z79.01 Long term (current) use of anticoagulants 08/20/2018 No Yes Inactive  Problems Resolved Problems Electronic Signature(s) Signed: 08/21/2018 10:45:49 PM By: Worthy Keeler PA-C Entered By: Worthy Keeler on 08/20/2018 10:55:06 Preuss, Elyanna Boyd. ([id number]) -------------------------------------------------------------------------------- Progress Note Details Patient Name: Abigail Boyd. Date of Service: 08/20/2018 9:45 AM Medical Record Number: [id number] Patient Account Number: [id number] Date of Birth/Sex: May 26, 1929 (85 y.o. Boyd) Treating RN: Montey Hora Primary Care Provider: Frazier Richards Other Clinician: Referring Provider: Frazier Richards Treating Provider/Extender: Melburn Hake, Eleri Ruben Weeks in Treatment: 0 Subjective Chief Complaint Information obtained from Patient Multiple pressure ulcers History of Present Illness (HPI) 08/20/18 on evaluation today patient presents for initial evaluation our clinic concerning issues that she's had with wound over the sacral region as well as a wound on the right heel. This apparently has been going on for several weeks although being a resident at [facility] patient's daughter tells me she has not been able to see her for some time. Subsequently she was told that her mother had a blister on her heel she did not expect to see what she saw today. Nonetheless she does seem to have a significant area of eschar with evidence of gangrene and the x-ray  which was performed on 08/13/18 showed evidence of gangrene as well and further evaluation with MRI or otherwise was recommended here. The patient was placed on an antibiotic although I do not see that in the record the daughter was aware of this. Subsequently she is no longer on this apparently at least according to what I see in the record. There does not appear to be any signs of systemic infection although she tells me that her mother seems to be much less active although she is alert. The patient is nonverbal secondary to multiple factors including what appears to be dementia as well as stroke. She does not really contribute anything to the history or physical exam portion of the evaluation today. Pretty much all the information is obtained from records and the daughter. Patient History Unable to Obtain Patient History due to Aphasia. Allergies Sulfa (Sulfonamide Antibiotics) (Severity: Severe, Reaction: Hives), amlodipine (Severity: Moderate, Reaction: cough), lisinopril (Severity: Moderate, Reaction: cough), losartan (Severity: Moderate, Reaction: cough) Family History Cancer - Siblings, Diabetes - Father, Stroke - Father, Thyroid Problems - Siblings, No family history of Heart Disease, Hereditary Spherocytosis, Hypertension, Kidney Disease, Lung Disease, Seizures, Tuberculosis. Social History Never smoker, Marital Status - Widowed, Alcohol Use - Never, Drug Use - No History, Caffeine Use - Never. Medical History Eyes Patient has history of Cataracts - both removed, Glaucoma Denies history of Optic Neuritis Ear/Nose/Mouth/Throat Denies history of Chronic sinus problems/congestion, Middle ear problems Hematologic/Lymphatic Denies history of Anemia, Hemophilia, Human Immunodeficiency Virus, Lymphedema, Sickle Cell Disease Respiratory Denies history of Aspiration, Asthma, Chronic Obstructive Pulmonary Disease (COPD), Pneumothorax, Sleep Apnea, Tuberculosis Cardiovascular Renfrew, Mikka  Boyd. ([id number]) Patient has history of Coronary Artery Disease, Hypertension, Myocardial Infarction Denies history of Angina, Arrhythmia, Congestive Heart Failure, Deep Vein Thrombosis, Hypotension, Peripheral Arterial Disease, Peripheral Venous Disease, Phlebitis, Vasculitis Gastrointestinal Denies history of Cirrhosis , Colitis, Crohn s, Hepatitis A, Hepatitis B, Hepatitis C Endocrine Patient has history of Type II Diabetes Genitourinary Denies history of End Stage Renal Disease Immunological Denies history of Lupus Erythematosus, Raynaud s, Scleroderma Musculoskeletal Denies history of Gout, Rheumatoid Arthritis, Osteoarthritis, Osteomyelitis Neurologic Denies history of Dementia, Neuropathy, Quadriplegia, Paraplegia, Seizure Disorder Oncologic Denies history of Received Chemotherapy, Received Radiation Psychiatric Denies history of Anorexia/bulimia, Confinement Anxiety Patient is treated with Insulin. Blood sugar is tested. Medical And Surgical History Notes Cardiovascular had  stent placed Genitourinary stage 2 chronic kidney disease Review of Systems (ROS) Constitutional Symptoms (General Health) Denies complaints or symptoms of Fatigue, Fever, Chills, Marked Weight Change. Eyes Complains or has symptoms of Glasses / Contacts - glasses. Denies complaints or symptoms of Dry Eyes, Vision Changes. Ear/Nose/Mouth/Throat Denies complaints or symptoms of Difficult clearing ears, Sinusitis. Hematologic/Lymphatic Denies complaints or symptoms of Bleeding / Clotting Disorders, Human Immunodeficiency Virus. Respiratory Denies complaints or symptoms of Chronic or frequent coughs, Shortness of Breath. Cardiovascular Denies complaints or symptoms of Chest pain, LE edema. Gastrointestinal Denies complaints or symptoms of Frequent diarrhea, Nausea, Vomiting. Endocrine Denies complaints or symptoms of Hepatitis, Thyroid disease, Polydypsia (Excessive Thirst). Genitourinary Denies  complaints or symptoms of Kidney failure/ Dialysis, Incontinence/dribbling. Immunological Denies complaints or symptoms of Hives, Itching. Integumentary (Skin) Complains or has symptoms of Wounds - 3. Denies complaints or symptoms of Bleeding or bruising tendency, Breakdown, Swelling. Musculoskeletal Complains or has symptoms of Muscle Weakness - d/t stroke. Neurologic Denies complaints or symptoms of Numbness/parasthesias, Focal/Weakness. Psychiatric Denies complaints or symptoms of Anxiety, Claustrophobia. CHARLINE, HOSKINSON Boyd. ([id number]) Objective Constitutional sitting or standing blood pressure is within target range for patient.. pulse regular and within target range for patient.Marland Boyd respirations regular, non-labored and within target range for patient.Marland Boyd temperature within target range for patient.. Well- nourished and well-hydrated in no acute distress. Vitals Time Taken: 10:00 AM, Height: 64 in, Source: Stated, Weight: 144 lbs, Source: Stated, BMI: 24.7, Temperature: 98.3  Boyd, Pulse: 70 bpm, Respiratory Rate: 16 breaths/min, Blood Pressure: 100/45 mmHg. Eyes conjunctiva clear no eyelid edema noted. pupils equal round and reactive to light and accommodation. Ears, Nose, Mouth, and Throat no gross abnormality of ear auricles or external auditory canals. normal hearing noted during conversation. mucus membranes moist. Respiratory normal breathing without difficulty. clear to auscultation bilaterally. Cardiovascular regular rate and rhythm with normal S1, S2. no clubbing, cyanosis, significant edema, Gastrointestinal (GI) soft, non-tender, non-distended, +BS. no ventral hernia noted. Musculoskeletal Patient unable to walk without assistance. Psychiatric Patient is not able to cooperate in decision making regarding care. Patient has dementia. pleasant and cooperative. General Notes: Patient appears to be alert and oriented however she does have a significant wound of the right heel  which does appear to be gangrenous at this time. I feel like this likely extends down to bone although the x-ray was not completely in 100% clear on this fact I believe that that is very likely. At least based on what I'm seeing at this time. Nonetheless I do feel like that the patient has been treated with antibiotics I'm just not sure that antibiotics are sufficient to completely take care of this there still in your theme and want noted unfortunately. Integumentary (Hair, Skin) Wound #1 status is Open. Original cause of wound was Blister. The wound is located on the Right Calcaneus. The wound measures 7.5cm length x 7.5cm width x 0.1cm depth; 44.179cm^2 area and 4.418cm^3 volume. There is Fat Layer (Subcutaneous Tissue) Exposed exposed. There is no tunneling or undermining noted. There is a large amount of purulent drainage noted. There is no granulation within the wound bed. There is a large (67-100%) amount of necrotic tissue within the wound bed including Eschar and Adherent Slough. Wound #2 status is Open. Original cause of wound was Pressure Injury. The wound is located on the Midline Sacrum. The wound measures 1.5cm length x 4.5cm width x 0.6cm depth; 5.301cm^2 area and 3.181cm^3 volume. There is Fat Layer (Subcutaneous Tissue) Exposed exposed. There is no tunneling or  undermining noted. There is a medium amount of purulent drainage noted. There is small (1-33%) pink granulation within the wound bed. There is a large (67-100%) amount of necrotic tissue within the wound bed including Adherent Slough. ADRIELLE, POLAKOWSKI Boyd. ([id number]) Wound #3 status is Open. Original cause of wound was Pressure Injury. The wound is located on the Left Metatarsal head first. The wound measures 1cm length x 1cm width x 0.1cm depth; 0.785cm^2 area and 0.079cm^3 volume. The wound is limited to skin breakdown. There is no tunneling or undermining noted. There is a none present amount of drainage noted. There is no  granulation within the wound bed. There is a medium (34-66%) amount of necrotic tissue within the wound bed including Eschar. Assessment Active Problems ICD-10 Pressure ulcer of right heel, unstageable Pressure ulcer of sacral region, stage 3 Pressure ulcer of other site, stage 3 Chronic kidney disease, stage 2 (mild) Unspecified speech disturbances Essential (primary) hypertension Long term (current) use of anticoagulants Plan Wound Cleansing: Wound #1 Right Calcaneus: Clean wound with Normal Saline. Wound #2 Midline Sacrum: Clean wound with Normal Saline. Wound #3 Left Metatarsal head first: Clean wound with Normal Saline. Primary Wound Dressing: Wound #1 Right Calcaneus: Silver Alginate Wound #2 Midline Sacrum: Silver Alginate Wound #3 Left Metatarsal head first: Other: - paint with betadine Secondary Dressing: Wound #2 Midline Sacrum: Boardered Foam Dressing Wound #1 Right Calcaneus: Conform/Kerlix Foam - foam heel cup for protection Dressing Change Frequency: Wound #1 Right Calcaneus: Change dressing every day. Wound #2 Midline Sacrum: Change dressing every day. Wound #3 Left Metatarsal head first: Change dressing every day. Follow-up Appointments: HEMA, LANZA ([id number]) Wound #1 Right Calcaneus: Return Appointment in 1 week. Wound #2 Midline Sacrum: Return Appointment in 1 week. Wound #3 Left Metatarsal head first: Return Appointment in 1 week. Off-Loading: Wound #1 Right Calcaneus: Mattress - air mattress Wound #2 Midline Sacrum: Mattress - air mattress Wound #3 Left Metatarsal head first: Mattress - air mattress At this time I discussed with the patient's daughter option since forestry is concerned. I explained that it could be that the best option for her mother may be palliative care and possibly the hospice care. With that being said that's not something that I think she can make the decision on right now for now we need to figure out what  exactly is even going on before proceeding down that road. Nonetheless I do believe that having her evaluated in the ER would be a completely appropriate thing to do based on what I am seeing. She may need IV antibiotics and then once things under control can be transferred back to the facility for ongoing care. At this point however I think the main goal is ensuring that the patient is not subject that is my biggest concern especially with her decreased activity level even though she is awake and oriented were not able to see what her normal baseline is as far as functioning is concerned. Subsequently will see the patient back depending on the evaluation and how things proceed at the emergency department. The patient's daughter was in agreement with this plan. Follow-up as needed Electronic Signature(s) Signed: 08/21/2018 10:45:49 PM By: Worthy Keeler PA-C Entered By: Worthy Keeler on 08/21/2018 22:32:08 Abigail Boyd ([id number]) -------------------------------------------------------------------------------- ROS/PFSH Details Patient Name: Abigail Boyd. Date of Service: 08/20/2018 9:45 AM Medical Record Number: [id number] Patient Account Number: [id number] Date of Birth/Sex: Apr 02, 1929 (85 y.o. Boyd) Treating RN: Army Melia Primary Care Provider: Frazier Richards Other  Clinician: Referring Provider: Frazier Richards Treating Provider/Extender: STONE III, Shital Crayton Weeks in Treatment: 0 Unable to Obtain Patient History due to oo Aphasia Constitutional Symptoms (General Health) Complaints and Symptoms: Negative for: Fatigue; Fever; Chills; Marked Weight Change Eyes Complaints and Symptoms: Positive for: Glasses / Contacts - glasses Negative for: Dry Eyes; Vision Changes Medical History: Positive for: Cataracts - both removed; Glaucoma Negative for: Optic Neuritis Ear/Nose/Mouth/Throat Complaints and Symptoms: Negative for: Difficult clearing ears; Sinusitis Medical  History: Negative for: Chronic sinus problems/congestion; Middle ear problems Hematologic/Lymphatic Complaints and Symptoms: Negative for: Bleeding / Clotting Disorders; Human Immunodeficiency Virus Medical History: Negative for: Anemia; Hemophilia; Human Immunodeficiency Virus; Lymphedema; Sickle Cell Disease Respiratory Complaints and Symptoms: Negative for: Chronic or frequent coughs; Shortness of Breath Medical History: Negative for: Aspiration; Asthma; Chronic Obstructive Pulmonary Disease (COPD); Pneumothorax; Sleep Apnea; Tuberculosis Cardiovascular Complaints and Symptoms: Negative for: Chest pain; LE edema Medical History: Positive for: Coronary Artery Disease; Hypertension; Myocardial Infarction Negative for: Angina; Arrhythmia; Congestive Heart Failure; Deep Vein Thrombosis; Hypotension; Peripheral Arterial Goodspeed, Bita Boyd. ([id number]) Disease; Peripheral Venous Disease; Phlebitis; Vasculitis Past Medical History Notes: had stent placed Gastrointestinal Complaints and Symptoms: Negative for: Frequent diarrhea; Nausea; Vomiting Medical History: Negative for: Cirrhosis ; Colitis; Crohnos; Hepatitis A; Hepatitis B; Hepatitis C Endocrine Complaints and Symptoms: Negative for: Hepatitis; Thyroid disease; Polydypsia (Excessive Thirst) Medical History: Positive for: Type II Diabetes Time with diabetes: 20 years Treated with: Insulin Blood sugar tested every day: Yes Tested : Genitourinary Complaints and Symptoms: Negative for: Kidney failure/ Dialysis; Incontinence/dribbling Medical History: Negative for: End Stage Renal Disease Past Medical History Notes: stage 2 chronic kidney disease Immunological Complaints and Symptoms: Negative for: Hives; Itching Medical History: Negative for: Lupus Erythematosus; Raynaudos; Scleroderma Integumentary (Skin) Complaints and Symptoms: Positive for: Wounds - 3 Negative for: Bleeding or bruising tendency; Breakdown;  Swelling Musculoskeletal Complaints and Symptoms: Positive for: Muscle Weakness - d/t stroke Medical History: Negative for: Gout; Rheumatoid Arthritis; Osteoarthritis; Osteomyelitis Neurologic Complaints and Symptoms: Negative for: Numbness/parasthesias; Focal/Weakness Hinde, Lilas Boyd. ([id number]) Medical History: Negative for: Dementia; Neuropathy; Quadriplegia; Paraplegia; Seizure Disorder Psychiatric Complaints and Symptoms: Negative for: Anxiety; Claustrophobia Medical History: Negative for: Anorexia/bulimia; Confinement Anxiety Oncologic Medical History: Negative for: Received Chemotherapy; Received Radiation HBO Extended History Items Eyes: Eyes: Cataracts Glaucoma Immunizations Pneumococcal Vaccine: Received Pneumococcal Vaccination: No Implantable Devices None Family and Social History Cancer: Yes - Siblings; Diabetes: Yes - Father; Heart Disease: No; Hereditary Spherocytosis: No; Hypertension: No; Kidney Disease: No; Lung Disease: No; Seizures: No; Stroke: Yes - Father; Thyroid Problems: Yes - Siblings; Tuberculosis: No; Never smoker; Marital Status - Widowed; Alcohol Use: Never; Drug Use: No History; Caffeine Use: Never; Financial Concerns: No; Food, Clothing or Shelter Needs: No; Support System Lacking: No; Transportation Concerns: No Electronic Signature(s) Signed: 08/20/2018 3:33:36 PM By: Army Melia Signed: 08/21/2018 10:45:49 PM By: Worthy Keeler PA-C Entered By: Army Melia on 08/20/2018 10:39:20 [place], Caridad Boyd. ([id number]) -------------------------------------------------------------------------------- SuperBill Details Patient Name: Abigail Boyd. Date of Service: 08/20/2018 Medical Record Number: [id number] Patient Account Number: [id number] Date of Birth/Sex: April 03, 1929 (85 y.o. Boyd) Treating RN: Montey Hora Primary Care Provider: Frazier Richards Other Clinician: Referring Provider: Frazier Richards Treating Provider/Extender: Melburn Hake,  Derek Huneycutt Weeks in Treatment: 0 Diagnosis Coding ICD-10 Codes Code Description L89.610 Pressure ulcer of right heel, unstageable L89.153 Pressure ulcer of sacral region, stage 3 L89.893 Pressure ulcer of other site, stage 3 N18.2 Chronic kidney disease, stage 2 (mild) R47.9 Unspecified speech disturbances I10 Essential (primary) hypertension Z79.01 Long term (current) use of  anticoagulants Facility Procedures CPT4 Code: 08676195 Description: [phone] - WOUND CARE VISIT-LEV 5 EST PT Modifier: Quantity: 1 Physician Procedures CPT4 Code: 7124580 Description: 99833 - WC PHYS LEVEL 4 - NEW PT ICD-10 Diagnosis Description L89.610 Pressure ulcer of right heel, unstageable L89.153 Pressure ulcer of sacral region, stage 3 L89.893 Pressure ulcer of other site, stage 3 N18.2 Chronic kidney disease,  stage 2 (mild) Modifier: Quantity: 1 Electronic Signature(s) Signed: 08/22/2018 9:27:03 AM By: Sharon Mt Previous Signature: 08/22/2018 9:26:46 AM Version By: Sharon Mt Previous Signature: 08/21/2018 10:45:49 PM Version By: Worthy Keeler PA-C Entered By: Sharon Mt on 08/22/2018 09:27:02

## 2018-08-23 NOTE — Care Management Important Message (Signed)
Important Message  Patient Details  Name: Abigail Boyd MRN: [id number] Date of Birth: 1929-04-20   Medicare Important Message Given:  Yes     Jhanae Jaskowiak, Lenice Llamas 08/23/2018, 12:23 PM

## 2018-08-23 NOTE — Consult Note (Signed)
Pharmacy Antibiotic Note  Abigail Boyd is a 85 y.o. female admitted on 08/20/2018 with Osteomyelitis.  Pharmacy has been consulted for Vancomycin and Cefepime dosing.   Plan: 1) Cefepime 2 g IV q12h   2)  Vancomycin 750 mg IV Q 24 hrs. Goal AUC 400-550. Expected AUC: 406 SCr used: 0.83 Cmin 10.6 Will need to continue to monitor renal function. Scr ordered for AM.   6/26- deciding on treatment course- either extensive I&D, or amputation or abx/pallative care   Height: 5\' 4"  (162.6 cm) Weight: 143 lb 15.4 oz (65.3 kg) IBW/kg (Calculated) : 54.7  Temp (24hrs), Avg:98.5 F (36.9 C), Min:98.4 F (36.9 C), Max:98.6 F (37 C)  Recent Labs  Lab 08/20/18 1307 08/20/18 1547 08/21/18 0338 08/22/18 0550 08/23/18 0515  WBC 19.2*  --  15.1*  --  12.0*  CREATININE 1.24*  --  0.83 0.89 0.90  LATICACIDVEN 3.0* 1.5  --   --   --     Estimated Creatinine Clearance: 38.7 mL/min (by C-G formula based on SCr of 0.9 mg/dL).    Allergies  Allergen Reactions  . Amlodipine Cough  . Clopidogrel Bisulfate Cough  . Kenalog [Triamcinolone Acetonide] Other (See Comments)    unknown  . Lisinopril Cough  . Losartan Cough  . Sulfa Antibiotics Hives and Rash    Antimicrobials this admission: 6/23 Cefepime >> 6/23 Vancomycin >>   Dose adjustments this admission: N/A  Microbiology results: 6/23 BCx: pending Wound Cx pending   Thank you for allowing pharmacy to be a part of this patient's care.  Inioluwa Baris A 08/23/2018 10:15 AM

## 2018-08-24 DIAGNOSIS — L8990 Pressure ulcer of unspecified site, unspecified stage: Secondary | ICD-10-CM | POA: Insufficient documentation

## 2018-08-24 LAB — CREATININE, SERUM
Creatinine, Ser: 0.75 mg/dL (ref 0.44–1.00)
GFR calc Af Amer: >60 mL/min (ref >60)
GFR calc non Af Amer: >60 mL/min (ref >60)

## 2018-08-24 LAB — GLUCOSE, CAPILLARY
Glucose-Capillary: 217 mg/dL — ABNORMAL HIGH (ref 70–99)
Glucose-Capillary: 163 mg/dL — ABNORMAL HIGH (ref 70–99)
Glucose-Capillary: 124 mg/dL — ABNORMAL HIGH (ref 70–99)
Glucose-Capillary: 126 mg/dL — ABNORMAL HIGH (ref 70–99)
Glucose-Capillary: 159 mg/dL — ABNORMAL HIGH (ref 70–99)

## 2018-08-24 NOTE — Progress Notes (Signed)
[facility] [place] NAME: Abigail Boyd    MR#:  [id number]  DATE OF BIRTH:  1929-05-07  SUBJECTIVE:   Patient here due to a right heel/foot ulcer/infection and noted to have osteomyelitis.  Patient likely needs surgical intervention with possible BKA but the patient's family does not want to pursue aggressive care.  Patient continues to have significant foul-smelling drainage from the left heel ulcer.  Patient is nonverbal at baseline due to dementia  REVIEW OF SYSTEMS:    Review of Systems  Unable to perform ROS: Mental acuity    Nutrition: Soft diet Tolerating Diet: Yes Tolerating PT: bedbound  DRUG ALLERGIES:   Allergies  Allergen Reactions  . Amlodipine Cough  . Clopidogrel Bisulfate Cough  . Kenalog [Triamcinolone Acetonide] Other (See Comments)    unknown  . Lisinopril Cough  . Losartan Cough  . Sulfa Antibiotics Hives and Rash    VITALS:  Blood pressure (!) 143/62, pulse 92, temperature 98 F (36.7 C), temperature source Oral, resp. rate 16, height 5\' 4"  (1.626 m), weight 65.3 kg, SpO2 98 %.  PHYSICAL EXAMINATION:   Physical Exam  GENERAL:  85 y.o.-year-old patient lying in bed non-verbal but in NAD.  EYES: Pupils equal, round, reactive to light and accommodation. No scleral icterus. Extraocular muscles intact.  HEENT: Head atraumatic, normocephalic. Oropharynx and nasopharynx clear.  NECK:  Supple, no jugular venous distention. No thyroid enlargement, no tenderness.  LUNGS: Normal breath sounds bilaterally, no wheezing, rales, rhonchi. No use of accessory muscles of respiration.  CARDIOVASCULAR: S1, S2 normal. No murmurs, rubs, or gallops.  ABDOMEN: Soft, nontender, nondistended. Bowel sounds present. No organomegaly or mass.  EXTREMITIES: No cyanosis, clubbing or edema b/l. Right heel ulcer with dressing in place    NEUROLOGIC: Cranial nerves II through XII are intact. No focal Motor or sensory deficits b/l.  Globally weak.    PSYCHIATRIC: The patient is alert and oriented x 1.   SKIN: No obvious rash, lesion, right hell ulcer with   large black eschar on the posterior right heel is approximately 6 cm x 7 cm there is some mild erythema along the periphery         LABORATORY PANEL:   CBC Recent Labs  Lab 08/23/18 0515  WBC 12.0*  HGB 9.6*  HCT 29.8*  PLT 424*   ------------------------------------------------------------------------------------------------------------------  Chemistries  Recent Labs  Lab 08/20/18 1307  08/23/18 0515 08/24/18 0659  NA 137   < > 137  --   K 4.0   < > 3.6  --   CL 100   < > 105  --   CO2 25   < > 24  --   GLUCOSE 86   < > 136*  --   BUN 41*   < > 16  --   CREATININE 1.24*   < > 0.90 0.75  CALCIUM 9.0   < > 8.6*  --   MG  --   --  2.0  --   AST 22  --   --   --   ALT 20  --   --   --   ALKPHOS 106  --   --   --   BILITOT 0.3  --   --   --    < > = values in this interval not displayed.   ------------------------------------------------------------------------------------------------------------------  Cardiac Enzymes No results for input(s): TROPONINI in the last 168 hours. ------------------------------------------------------------------------------------------------------------------  RADIOLOGY:  No results found.   ASSESSMENT AND PLAN:   85 year old female with past medical history of diabetes with CKD stage III, paroxysmal atrial fibrillation, coronary artery disease, essential hypertension, history of previous CVA who presented to the hospital due to a foul-smelling right heel ulcer.  1.  Diabetic ulcer on the right heel- suspected to be secondary to underlying osteomyelitis.  X-ray did not suspected but MRI confirmed osteomyelitis. -Seen by both podiatry and vascular surgery. -Patient underwent angiogram yesterday with angioplasty to the right lower extremity to improve blood flow. - As per vascular surgery/podiatry patient  likely needs above-the-knee amputation but the patient's daughter does not want further aggressive care. -Continue IV vancomycin, cefepime for now. -Palliative care was consulted to discuss goals of care.  2.  Diabetes type 2 with CKD stage III- continue sliding scale insulin, follow blood sugars.  3.  Paroxysmal atrial fibrillation- rate controlled. -Continue metoprolol.  Xarelto currently on hold.  4.  Neuropathy secondary to diabetes-continue gabapentin.  5.  Depression-continue Prozac.  6.  Restless leg syndrome-continue Requip.  7.  Glaucoma-continue timolol eyedrops.  8.  GERD-continue Protonix.  9.  History of urinary incontinence-continue oxybutynin.  All the records are reviewed and case discussed with Care Management/Social Worker. Management plans discussed with the patient, family and they are in agreement.  CODE STATUS: DNR  DVT Prophylaxis: Lovenox  TOTAL TIME TAKING CARE OF THIS PATIENT: 30 minutes.   POSSIBLE D/C IN 2-3 DAYS, DEPENDING ON CLINICAL CONDITION.   Henreitta Leber M.D on 08/24/2018 at 3:32 PM  Between 7am to 6pm - Pager - [phone]  After 6pm go to [email or web address] - Technical brewer [facility]  Office  [phone]  CC: Primary care physician; Kirk Ruths, MD

## 2018-08-24 NOTE — Evaluation (Signed)
Clinical/Bedside Swallow Evaluation Patient Details  Name: Abigail Boyd MRN: [id number] Date of Birth: [birth year]  Today's Date: 08/24/2018 Time: SLP Start Time (ACUTE ONLY): 36 SLP Stop Time (ACUTE ONLY): 1050 SLP Time Calculation (min) (ACUTE ONLY): 30 min  Past Medical History:  Past Medical History:  Diagnosis Date  . Abnormal nuclear stress test 06/21/2011   Inferolateral reversible defect;--> cardiac cath & PCI of CxOM, occluded RCA with collaterals;; followup Myoview 11/2012: Low risk. Fixed basal inferior artifact normal EF. No ischemia  . Anxiety   . Asthma   . Bilateral cataracts    Status post stroke or correction  . CAD S/P percutaneous coronary angioplasty 06/2011   s/P PCI to proximal OM1 w/ DES; occluded RCA with bridging and L-R collaterals (medical management)  . Chronic kidney disease (CKD), stage II (mild)    Related to current bladder infections (although diabetes cannot be excluded)  . Chronotropic incompetence with sinus node dysfunction Abigail Boyd) October 2013   On CPET test; beta blockers reduced  . Diabetes mellitus type 2, controlled (Abigail Boyd)    On oral medications  . Diverticulitis   . Essential hypertension    Allowing for permissive hypertension to avoid orthostatic hypotension  . GERD (gastroesophageal reflux disease)    On PPI  . Glaucoma   . History of syncope    Per EP - neurocardiogenic & not Bradycardia related (no PPM)  . History of unstable angina 06/13/2011   Jaw pain awakening from sleep -- Myoview --CATH --> PCI  . Hyperlipidemia with target LDL less than 70    HDL at goal, LDL not at goal, borderline triglycerides. On Crestor 20 mg  . Migraines   . OSA (obstructive sleep apnea)    hx bladder infections  . Osteoarthritis   . PAF (paroxysmal atrial fibrillation) (Abigail Boyd) 10-11/ 2014   CardioNet Event Monitor: NSR & S Brady -- Rates 50-100; Total A. fib burden 35 hours and 27 minutes. 1296 episodes, longest was 1 hour 29 minutes. Rate ranged from  52-169 beats per minute.  . Seasonal allergies   . Shortness of breath on exertion October 2013   2-D echo: Normal EF>55%, Gr 1DD, mild aortic sclerosis; Evaluated with CPET - peak VO2 97%; Chronotropic Incompetence (submaximal effort)  . Spinal stenosis of lumbar region 11/2011  . Stroke (Abigail Boyd) 06/2017  . Stroke (Abigail Boyd) 11/2014  . Tachycardia-bradycardia syndrome (Abigail Boyd) 11/2012  . Urge incontinence    Past Surgical History:  Past Surgical History:  Procedure Laterality Date  . APPENDECTOMY    . BREAST BIOPSY     both breast  . cataracts     both eyes  . COLONOSCOPY    . CORONARY ANGIOPLASTY WITH STENT PLACEMENT  07/05/2011   Promus Element DES 2.5 mm x 16 mm-post dilated to 2.65 mm CX-Proximal OM1  . Holter Monitor  04/2015   Sinus rhythm with rates 52-149 BPM. Isolated PACs with rare couplets and bigeminy. Multiple short runs of PAT/PSVT. Arrhythmia run 120 bpm for 204 beats. 14% of time was in A. fib/flutter. - Reviewed by Dr. Caryl Comes. Not thought to be significant enough for her syncope.  Marland Kitchen KNEE ARTHROSCOPY WITH MEDIAL MENISECTOMY Right 06/24/2014   Procedure: RIGHT KNEE ARTHROSCOPY WITH partial lateral MENISECTOMY, abrasion chondroplasty of medial femoral condyle and patella, microfracture technique;  Surgeon: Latanya Maudlin, MD;  Location: [facility];  Service: Orthopedics;  Laterality: Right;  . LEFT HEART CATHETERIZATION WITH CORONARY ANGIOGRAM N/A 07/06/2011   Procedure: LEFT HEART CATHETERIZATION WITH CORONARY ANGIOGRAM;  Surgeon: Leonie Man, MD;  Location: [facility]: Unstable Angina --> Myoview wiht Inf-Lat Ischemia.  Proximal OM1 lesion --> PCI; 100% mid RCA with right to right and left to right bridging collaterals from circumflex RPL and LAD the PDA.  . LOWER EXTREMITY ANGIOGRAPHY Right 08/23/2018   Procedure: Lower Extremity Angiography;  Surgeon: Katha Cabal, MD;  Location: [facility];  Service: Cardiovascular;  Laterality: Right;  . NM MYOVIEW LTD  October 2014     Low risk. Fixed basal inferior artifact normal EF. No ischemia --> (as compared to pre-PCI Myoview revealing inferolateral ischemia)  . rotator cuff Right   . TRANSTHORACIC ECHOCARDIOGRAM  11/2014   EF 65-70%, Mod LVH. Normal wall motion. Gr 1 DD. Normal valves.  Marland Kitchen VAGINAL HYSTERECTOMY     HPI:      Assessment / Plan / Recommendation Clinical Impression  pt presents with an oral pharyngeal dysphagia as characterized by prolonged mastication, delayed a-p transit, poor bolus formation. pt did have a throat clear x 1 with solid bolus. pt had difficulty masticating regular solids. pt had no overt ssx aspiration with ms, puree or thin liquids. pt did have some noted difficulty with thin liquids via straw as she pulled too much liquid into her oral cavity and was unable to transition bolus to posterior oral cavity in a timely manner and had anterior spilage. pt was noted to have pocketing with solid bolus but able to clear with liquid wash via cup. SLP rcommends to downgrade to dysphagia 2 with thin via cup and no straws. SLP notified nursing and RN agreed to slp recommendations. SLP will continue to follow. SLP Visit Diagnosis: Dysphagia, oropharyngeal phase (R13.12)    Aspiration Risk  Mild aspiration risk    Diet Recommendation Dysphagia 2 (Fine chop);Thin liquid   Liquid Administration via: Cup;No straw Medication Administration: Crushed with puree Supervision: Staff to assist with self feeding Compensations: Slow rate;Small sips/bites;Follow solids with liquid Postural Changes: Seated upright at 90 degrees    Other  Recommendations Oral Care Recommendations: Oral care BID Other Recommendations: Remove water pitcher   Follow up Recommendations        Frequency and Duration min 3x week  1 week       Prognosis Prognosis for Safe Diet Advancement: Fair Barriers to Reach Goals: Cognitive deficits      Swallow Study   General Date of Onset: 08/24/18 Type of Study: Bedside  Swallow Evaluation Previous Swallow Assessment: none known Diet Prior to this Study: Regular;Thin liquids Temperature Spikes Noted: No Respiratory Status: Room air History of Recent Intubation: No Behavior/Cognition: Cooperative;Confused Oral Cavity Assessment: Within Functional Limits Oral Care Completed by SLP: No Oral Cavity - Dentition: Poor condition Vision: Functional for self-feeding Self-Feeding Abilities: Needs assist Patient Positioning: Upright in bed Baseline Vocal Quality: Not observed Volitional Cough: Cognitively unable to elicit    Oral/Motor/Sensory Function Overall Oral Motor/Sensory Function: Within functional limits   Ice Chips Ice chips: Within functional limits Presentation: Spoon   Thin Liquid Thin Liquid: Within functional limits Presentation: Straw;Cup    Nectar Thick Nectar Thick Liquid: Within functional limits   Honey Thick Honey Thick Liquid: Not tested   Puree Puree: Within functional limits Presentation: Spoon   Solid     Solid: Impaired Oral Phase Impairments: Reduced lingual movement/coordination;Impaired mastication Oral Phase Functional Implications: Prolonged oral transit;Oral holding;Impaired mastication Pharyngeal Phase Impairments: Multiple swallows      [place] Sauber 08/24/2018,11:07 AM

## 2018-08-25 LAB — CULTURE, BLOOD (ROUTINE X 2)
Culture: NO GROWTH
Culture: NO GROWTH
Specimen Description: ADEQUATE
Specimen Description: ADEQUATE

## 2018-08-25 LAB — GLUCOSE, CAPILLARY
Glucose-Capillary: 152 mg/dL — ABNORMAL HIGH (ref 70–99)
Glucose-Capillary: 166 mg/dL — ABNORMAL HIGH (ref 70–99)
Glucose-Capillary: 175 mg/dL — ABNORMAL HIGH (ref 70–99)
Glucose-Capillary: 139 mg/dL — ABNORMAL HIGH (ref 70–99)
Glucose-Capillary: 120 mg/dL — ABNORMAL HIGH (ref 70–99)

## 2018-08-25 NOTE — Plan of Care (Signed)
  Problem: Education: Goal: Knowledge of General Education information will improve Description: Including pain rating scale, medication(s)/side effects and non-pharmacologic comfort measures Outcome: Progressing   Problem: Pain Managment: Goal: General experience of comfort will improve Outcome: Progressing   Problem: Skin Integrity: Goal: Risk for impaired skin integrity will decrease Outcome: Progressing   

## 2018-08-25 NOTE — Progress Notes (Signed)
[facility] [place] NAME: Abigail Boyd    MR#:  [id number]  DATE OF BIRTH:  17-Nov-1929  SUBJECTIVE:   Patient here due to a right heel/foot ulcer/infection and noted to have osteomyelitis.  Patient likely needs surgical intervention with possible AKA but the patient's family does not want to pursue aggressive care.    No other acute events overnight.   REVIEW OF SYSTEMS:    Review of Systems  Unable to perform ROS: Mental acuity    Nutrition: Soft diet Tolerating Diet: Yes Tolerating PT: bedbound  DRUG ALLERGIES:   Allergies  Allergen Reactions  . Amlodipine Cough  . Clopidogrel Bisulfate Cough  . Kenalog [Triamcinolone Acetonide] Other (See Comments)    unknown  . Lisinopril Cough  . Losartan Cough  . Sulfa Antibiotics Hives and Rash    VITALS:  Blood pressure (!) 175/94, pulse 99, temperature 98 F (36.7 C), temperature source Oral, resp. rate 18, height 5\' 4"  (1.626 m), weight 65.3 kg, SpO2 99 %.  PHYSICAL EXAMINATION:   Physical Exam  GENERAL:  85 y.o.-year-old patient lying in bed non-verbal but in NAD.  EYES: Pupils equal, round, reactive to light and accommodation. No scleral icterus. Extraocular muscles intact.  HEENT: Head atraumatic, normocephalic. Oropharynx and nasopharynx clear.  NECK:  Supple, no jugular venous distention. No thyroid enlargement, no tenderness.  LUNGS: Normal breath sounds bilaterally, no wheezing, rales, rhonchi. No use of accessory muscles of respiration.  CARDIOVASCULAR: S1, S2 normal. No murmurs, rubs, or gallops.  ABDOMEN: Soft, nontender, nondistended. Bowel sounds present. No organomegaly or mass.  EXTREMITIES: No cyanosis, clubbing or edema b/l. Right heel ulcer with dressing in place    NEUROLOGIC: Cranial nerves II through XII are intact. No focal Motor or sensory deficits b/l. Globally weak.    PSYCHIATRIC: The patient is alert and oriented x 1.   SKIN: No obvious rash, lesion,  right hell ulcer with   large black eschar on the posterior right heel is approximately 6 cm x 7 cm there is some mild erythema along the periphery         LABORATORY PANEL:   CBC Recent Labs  Lab 08/23/18 0515  WBC 12.0*  HGB 9.6*  HCT 29.8*  PLT 424*   ------------------------------------------------------------------------------------------------------------------  Chemistries  Recent Labs  Lab 08/20/18 1307  08/23/18 0515 08/24/18 0659  NA 137   < > 137  --   K 4.0   < > 3.6  --   CL 100   < > 105  --   CO2 25   < > 24  --   GLUCOSE 86   < > 136*  --   BUN 41*   < > 16  --   CREATININE 1.24*   < > 0.90 0.75  CALCIUM 9.0   < > 8.6*  --   MG  --   --  2.0  --   AST 22  --   --   --   ALT 20  --   --   --   ALKPHOS 106  --   --   --   BILITOT 0.3  --   --   --    < > = values in this interval not displayed.   ------------------------------------------------------------------------------------------------------------------  Cardiac Enzymes No results for input(s): TROPONINI in the last 168 hours. ------------------------------------------------------------------------------------------------------------------  RADIOLOGY:  No results found.   ASSESSMENT AND PLAN:   85 year old female with  past medical history of diabetes with CKD stage III, paroxysmal atrial fibrillation, coronary artery disease, essential hypertension, history of previous CVA who presented to the hospital due to a foul-smelling right heel ulcer.  1.  Diabetic ulcer on the right heel- suspected to be secondary to underlying osteomyelitis.  X-ray was negative but MRI confirmed osteomyelitis. -Seen by both podiatry and vascular surgery. -Patient underwent angiogram 2 days ago with angioplasty to the right lower extremity to improve blood flow. - As per vascular surgery/podiatry patient likely needs above-the-knee amputation but the patient's daughter does not want further aggressive care.  -Continue IV vancomycin, cefepime for now. -Palliative care following.   2.  Diabetes type 2 with CKD stage III- continue sliding scale insulin, follow blood sugars.  3.  Paroxysmal atrial fibrillation- rate controlled. -Continue metoprolol.  Xarelto currently on hold.  4.  Neuropathy secondary to diabetes-continue gabapentin.  5.  Depression-continue Prozac.  6.  Restless leg syndrome-continue Requip.  7.  Glaucoma-continue timolol eyedrops.  8.  GERD-continue Protonix.  9.  History of urinary incontinence-continue oxybutynin.  Attempted to call pt's daughter to give update and speak to her but there was no answer and will re-attempt later today or tomorrow.   All the records are reviewed and case discussed with Care Management/Social Worker. Management plans discussed with the patient, family and they are in agreement.  CODE STATUS: DNR  DVT Prophylaxis: Lovenox  TOTAL TIME TAKING CARE OF THIS PATIENT: 30 minutes.   POSSIBLE D/C IN 2-3 DAYS, DEPENDING ON CLINICAL CONDITION.   Henreitta Leber M.D on 08/25/2018 at 1:26 PM  Between 7am to 6pm - Pager - [phone]  After 6pm go to [email or web address] - [employer] [facility]  Office  [phone]  CC: Primary care physician; Kirk Ruths, MD

## 2018-08-26 DIAGNOSIS — Z9862 Peripheral vascular angioplasty status: Secondary | ICD-10-CM

## 2018-08-26 LAB — GLUCOSE, CAPILLARY
Glucose-Capillary: 179 mg/dL — ABNORMAL HIGH (ref 70–99)
Glucose-Capillary: 170 mg/dL — ABNORMAL HIGH (ref 70–99)
Glucose-Capillary: 157 mg/dL — ABNORMAL HIGH (ref 70–99)
Glucose-Capillary: 193 mg/dL — ABNORMAL HIGH (ref 70–99)

## 2018-08-26 LAB — VANCOMYCIN, PEAK: Vancomycin Pk: 31 ug/mL (ref 30–40)

## 2018-08-26 NOTE — Progress Notes (Signed)
[facility] [place] NAME: Abigail Boyd    MR#:  [id number]  DATE OF BIRTH:  [birth year]  SUBJECTIVE:   Patient here due to a right heel/foot ulcer/infection and noted to have osteomyelitis.    No other acute events overnight.   REVIEW OF SYSTEMS:    Review of Systems  Unable to perform ROS: Mental acuity    Nutrition: Soft diet Tolerating Diet: Yes Tolerating PT: bedbound  DRUG ALLERGIES:   Allergies  Allergen Reactions  . Amlodipine Cough  . Clopidogrel Bisulfate Cough  . Kenalog [Triamcinolone Acetonide] Other (See Comments)    unknown  . Lisinopril Cough  . Losartan Cough  . Sulfa Antibiotics Hives and Rash    VITALS:  Blood pressure (!) 174/98, pulse 92, temperature 98.4 F (36.9 C), temperature source Oral, resp. rate 18, height 5\' 4"  (1.626 m), weight 65.3 kg, SpO2 100 %.  PHYSICAL EXAMINATION:   Physical Exam  GENERAL:  85 y.o.-year-old patient lying in bed non-verbal but in NAD.  EYES: Pupils equal, round, reactive to light and accommodation. No scleral icterus. Extraocular muscles intact.  HEENT: Head atraumatic, normocephalic. Oropharynx and nasopharynx clear.  NECK:  Supple, no jugular venous distention. No thyroid enlargement, no tenderness.  LUNGS: Normal breath sounds bilaterally, no wheezing, rales, rhonchi. No use of accessory muscles of respiration.  CARDIOVASCULAR: S1, S2 normal. No murmurs, rubs, or gallops.  ABDOMEN: Soft, nontender, nondistended. Bowel sounds present. No organomegaly or mass.  EXTREMITIES: No cyanosis, clubbing or edema b/l. Right heel ulcer with dressing in place    NEUROLOGIC: Cranial nerves II through XII are intact. No focal Motor or sensory deficits b/l. Globally weak.    PSYCHIATRIC: The patient is alert and oriented x 1.   SKIN: No obvious rash, lesion, right hell ulcer with   large black eschar on the posterior right heel is approximately 6 cm x 7 cm there is some mild  erythema along the periphery         LABORATORY PANEL:   CBC Recent Labs  Lab 08/23/18 0515  WBC 12.0*  HGB 9.6*  HCT 29.8*  PLT 424*   ------------------------------------------------------------------------------------------------------------------  Chemistries  Recent Labs  Lab 08/20/18 1307  08/23/18 0515 08/24/18 0659  NA 137   < > 137  --   K 4.0   < > 3.6  --   CL 100   < > 105  --   CO2 25   < > 24  --   GLUCOSE 86   < > 136*  --   BUN 41*   < > 16  --   CREATININE 1.24*   < > 0.90 0.75  CALCIUM 9.0   < > 8.6*  --   MG  --   --  2.0  --   AST 22  --   --   --   ALT 20  --   --   --   ALKPHOS 106  --   --   --   BILITOT 0.3  --   --   --    < > = values in this interval not displayed.   ------------------------------------------------------------------------------------------------------------------  Cardiac Enzymes No results for input(s): TROPONINI in the last 168 hours. ------------------------------------------------------------------------------------------------------------------  RADIOLOGY:  No results found.   ASSESSMENT AND PLAN:   85 year old female with past medical history of diabetes with CKD stage III, paroxysmal atrial fibrillation, coronary artery disease, essential hypertension, history of previous  CVA who presented to the hospital due to a foul-smelling right heel ulcer.  1.  Diabetic ulcer on the right heel- suspected to be secondary to underlying osteomyelitis.  X-ray was negative but MRI confirmed osteomyelitis. -Seen by both podiatry and vascular surgery. -Patient underwent angiogram 3 days ago with angioplasty to the right lower extremity to improve blood flow. -Vascular surgery and podiatry to further discuss surgical options for this patient with the patient's daughter she is unclear and indecisive on which way she wants to proceed. -I have included palliative care, vascular surgery, podiatry on the same text dialogue to  further clarify the next plan of care for this patient.  Await further input from vascular surgery after discuss plans with patient's daughter. -Palliative care following.   2.  Diabetes type 2 with CKD stage III- continue sliding scale insulin, follow blood sugars.  3.  Paroxysmal atrial fibrillation- rate controlled. -Continue metoprolol.  Xarelto currently on hold for now.   4.  Neuropathy secondary to diabetes- continue gabapentin.  5.  Depression- continue Prozac.  6.  Restless leg syndrome- continue Requip.  7.  Glaucoma- continue timolol eyedrops.  8.  GERD- continue Protonix.  9.  History of urinary incontinence- continue oxybutynin.  Discussed plan of care with ID, Vascular surgery, Podiatry.    All the records are reviewed and case discussed with Care Management/Social Worker. Management plans discussed with the patient, family and they are in agreement.  CODE STATUS: DNR  DVT Prophylaxis: Lovenox  TOTAL TIME TAKING CARE OF THIS PATIENT: 30 minutes.   POSSIBLE D/C IN 2-3 DAYS, DEPENDING ON CLINICAL CONDITION.   Henreitta Leber M.D on 08/26/2018 at 2:25 PM  Between 7am to 6pm - Pager - [phone]  After 6pm go to [email or web address] - [employer] [facility]  Office  [phone]  CC: Primary care physician; Kirk Ruths, MD

## 2018-08-26 NOTE — Progress Notes (Signed)
Daily Progress Note   Patient Name: Abigail Boyd       Date: 08/26/2018 DOB: Jun 09, 1929  Age: 85 y.o. MRN#: [id number] Attending Physician: Henreitta Leber, MD Primary Care Physician: Kirk Ruths, MD Admit Date: 08/20/2018  Reason for Consultation/Follow-up: Establishing goals of care  Subjective: Patient lying on her side. She is awake. Will not follow commands. Denies pain when asked, however will not respond to other verbal commands or questions. Random crying at times. No family at the bedside. Continues to have poor po intake.    Spoke with her daughter, Abigail Boyd at length again today. She is tearful during discussion. She was able to come in over the weekend and spend some time with her mother. She shares her observation of her noticeable decline and poor appetite. She reports she visited for majority of the day Saturday and Sunday and both times she did not engage much and she spent hours attempting to get her to eat something which she only ate a few sips and bites. She is tearful in sharing that she does not want her mother to suffer and she knows if she is not eating or drinking she will continue to decline.   We discussed again in details her overall poor nutritional and functional state, as well as possible options regarding her osteomyelitis. She reports she is still conflicted in continuation of further treatment such as AKA versus keeping her comfortable. She express her fears of proceeding with surgery and her mother suffering from pain and discomfort, and continued decline afterwards. She expresses she would only consider the surgery if recommended if she knew it would provide her more longevity and increase her quality of life, because 9-10 months ago her quality of life was  good, but she knows her mother wouldn't want to be in her current state or worst. Support provided.   We discussed in detail best case and worst case scenarios. I encouraged daughter to consider her mother's wishes and her quality of life when making her decisions. She is tearful in expressing she doesn't think she would want aggressive care and to be allowed to pass away naturally. She worries her nutritional intake will continue to remain poor and again expressed "then we are still at the same spot, having this same conversation!" Emotional support provided.  Daughter asked appropriate questions regarding options. We discussed in detail comfort care measures and hospice. We discussed hosp[facility] at [facility] and also residential hospice. She reports she would prefer hospice home if she chooses to shift care to a more comfort approach. She is tearful in stating she felt her condition is related to care at [facility] and is hopeful she would not have to return there. I discussed in details criteria for hospice home admission. Abigail Boyd verbalized understanding and appreciation of discussion and support. She shares is leaning more towards comfort and hospice home, but is not prepared to make a decision until she has a full understanding what recommendations are regarding any future interventions and understand of all options.   Length of Stay: 6  Current Medications: Scheduled Meds:   docusate sodium  100 mg Oral Daily   enoxaparin (LOVENOX) injection  1 mg/kg Subcutaneous Q12H   feeding supplement (ENSURE ENLIVE)  237 mL Oral BID BM   FLUoxetine  40 mg Oral Daily   furosemide  20 mg Oral Daily   gabapentin  300 mg Oral TID   insulin aspart  0-5 Units Subcutaneous QHS   insulin aspart  0-9 Units Subcutaneous TID WC   latanoprost  1 drop Both Eyes QHS   levETIRAcetam  500 mg Oral BID   metoprolol tartrate  12.5 mg Oral Daily   multivitamin with minerals  1 tablet Oral Daily   oxybutynin  5 mg  Oral BID   pantoprazole  40 mg Oral Daily   polyethylene glycol  17 g Oral Daily   rOPINIRole  0.5 mg Oral QHS   senna  2 tablet Oral QHS   sodium chloride flush  3 mL Intravenous Q12H   timolol  1 drop Both Eyes QHS   vitamin C  250 mg Oral BID    Continuous Infusions:  sodium chloride     sodium chloride 75 mL/hr at 08/26/18 1209   sodium chloride     ceFEPime (MAXIPIME) IV 2 g (08/26/18 1210)   vancomycin 750 mg (08/25/18 1330)    PRN Meds: sodium chloride, docusate sodium, hydrALAZINE, labetalol, morphine injection, morphine injection, nitroGLYCERIN, ondansetron (ZOFRAN) IV, ondansetron (ZOFRAN) IV, oxyCODONE-acetaminophen, sodium chloride flush  Physical Exam         GENERAL: NAD, non verbal, chronically-ill appearing  LUNGS: clear, diminished bases CARDIOVASCULAR: RRR SKIN:some scattered bruising, sacral ulceration (not assessed), right heel eschar (dressings intact) NEUROLOGICAL: generalized weakness, bed bound, nonverbal  Vital Signs: BP (!) 174/98    Pulse 92    Temp 98.4 F (36.9 C) (Oral)    Resp 18    Ht 5\' 4"  (1.626 m)    Wt 65.3 kg    SpO2 100%    BMI 24.71 kg/m  SpO2: SpO2: 100 % O2 Device: O2 Device: Room Air O2 Flow Rate: O2 Flow Rate (L/min): 2 L/min  Intake/output summary:   Intake/Output Summary (Last 24 hours) at 08/26/2018 1301 Last data filed at 08/26/2018 0400 Gross per 24 hour  Intake 1691.86 ml  Output 900 ml  Net 791.86 ml   LBM: Last BM Date: 08/24/18 Baseline Weight: Weight: 65.3 kg Most recent weight: Weight: 65.3 kg       Palliative Assessment/Data: PPS 20%       Patient Active Problem List   Diagnosis Date Noted   Pressure injury of skin 08/24/2018   Osteomyelitis ([place]) 08/20/2018   Debility 02/22/2018   Palliative care encounter 02/22/2018   Memory loss 02/22/2018   Somnolence 01/19/2018  Palliative care by specialist    Altered mental status    Dysphagia    Seizure disorder ([place]) 12/21/2017    Chronic bilateral low back pain with right-sided sciatica 10/15/2017   Hypertensive heart disease with congestive heart failure and stage 2 kidney disease ([place]) 09/06/2017   Diabetes mellitus type 2 with neurological manifestations ([place]) 09/06/2017   Diabetic neuropathy ([place]) 09/06/2017   Chronic glaucoma 09/06/2017   Acute CVA (cerebrovascular accident) ([place]) 07/23/2017   Chronic diastolic CHF (congestive heart failure) ([place]) 07/21/2017   Chronic cerebrovascular accident (CVA) 06/27/2017   Generalized weakness 04/23/2017   Recurrent UTI 04/23/2017   Anxiety 12/13/2015   Allergic rhinitis 12/13/2015   Urge incontinence 03/15/2015   Constipation 03/15/2015   Right knee pain 03/15/2015   CKD stage 2 due to type 2 diabetes mellitus ([place])    Goals of care, counseling/discussion 06/10/2014   PLMD (periodic limb movement disorder) 06/12/2013   Obstructive sleep apnea 03/05/2013   Tachy-brady syndrome: Rates range from 50-169 bpm in A. fib 12/20/2012   History of syncope: Unclear etiology 12/19/2012   PAF (paroxysmal atrial fibrillation) ([place]);  CHA2DS2-VASc Score =6 on 15 mg Xarelto 05/16/2012    Class: Diagnosis of   Long term current use of anticoagulant therapy 05/16/2012   CAD-PCI OM1, 100% RCA (L-R colllaterals) 07/07/2011   Essential hypertension     Class: Diagnosis of   Dyslipidemia associated with type 2 diabetes mellitus ([place])     Class: Diagnosis of   Diverticulitis 03/21/2011   GERD (gastroesophageal reflux disease) 03/21/2011   Goiter 03/21/2011   History of carpal tunnel syndrome 03/21/2011   Osteopenia 03/21/2011   Vitamin D deficiency disease 03/21/2011    Palliative Care Assessment & Plan   Patient Profile: Palliative Care consult requested for this 85 y.o. female with multiple medical problems including coronary artery disease status post stent, chronic kidney disease stage II, diabetes, hypertension, stroke, asthma, and paroxysmal  atrial fibrillation. She presented to ED after being evaluated at the wound care clinic due to right heel ulceration. Foot x-ray showed suspected osteomyelitis. MRI later confirmed. She has been seen by Podiatry and Vascular surgery. She is undergoing an angiogram and angioplasty today per Vascular. Consultation received for goals of care discussion.   Recommendations/Plan:  DNR/DNI  Continue with current plan of care  Daughter deciding between transitioning to full comfort and hospice versus continued aggressive medical interventions. Pending discussion with Vascular team.  Requesting to follow up tomorrow 6/30 @10  for further discussion and decisions  PMT will continue to follow and support.   Goals of Care and Additional Recommendations:  Limitations on Scope of Treatment: Full Scope Treatment  Code Status:    Code Status Orders  (From admission, onward)         Start     Ordered   08/20/18 2100  Do not attempt resuscitation (DNR)  Continuous    Question Answer Comment  In the event of cardiac or respiratory ARREST Do not call a code blue   In the event of cardiac or respiratory ARREST Do not perform Intubation, CPR, defibrillation or ACLS   In the event of cardiac or respiratory ARREST Use medication by any route, position, wound care, and other measures to relive pain and suffering. May use oxygen, suction and manual treatment of airway obstruction as needed for comfort.      08/20/18 2059        Code Status History    Date Active Date Inactive Code Status Order  ID Comments User Context   12/22/2017 0108 12/27/2017 1631 DNR [id number]  Elwyn Reach, MD Inpatient   07/22/2017 0002 07/25/2017 1915 DNR [id number]  Vianne Bulls, MD ED   07/05/2017 2005 07/09/2017 2224 DNR [id number]  Saundra Shelling, MD Inpatient   07/05/2017 1818 07/05/2017 2004 DNR [id number]  Saundra Shelling, MD ED   04/23/2017 2216 04/25/2017 2153 DNR [id number]  [name], MD Inpatient    12/26/2014 1822 12/29/2014 1632 Full Code [id number]  Allie Bossier, MD ED   12/19/2012 2229 12/20/2012 2124 Full Code [id number]  Consuelo Pandy, PA-C Inpatient   Advance Care Planning Activity    Advance Directive Documentation     Most Recent Value  Type of Advance Directive  Out of facility DNR (pink MOST or yellow form)  Pre-existing out of facility DNR order (yellow form or pink MOST form)  --  "MOST" Form in Place?  --       Prognosis:  Poor   Discharge Planning:  To Be Determined  Care plan was discussed with patient's daughter/poa Abigail Boyd and medical team.   Thank you for allowing the Palliative Medicine Team to assist in the care of this patient.  The above conversation was completed via telephone due to the visitor restrictions during the COVID-19 pandemic. Thorough chart review and discussion with necessary members of the care team was completed as part of assessment.   Time In: 1300 Time Out: 1405 Total Time: 65 min.   Greater than 50%  of this time was spent counseling and coordinating care related to the above assessment and plan.  Alda Lea, AGPCNP-BC Palliative Medicine Team  Phone: [phone] Pager: [phone] Amion: Bjorn Pippin    Please contact Palliative Medicine Team phone at [phone] for questions and concerns.

## 2018-08-26 NOTE — Progress Notes (Signed)
[facility]   Vascular Surgery Communication Note As per Epic notes, the family is not interesting in pursuing any aggressive treatment at this time. Vascular Surgery to sign off. If family changes their mind and would like to discuss moving forward with an AKA - please re-consult and will we be happy to assist. No need for outpatient follow up if the family chooses comfort care.   Discussed with Dr. Eber Hong Irini Leet PA-C 08/26/2018 10:16 AM

## 2018-08-26 NOTE — Progress Notes (Signed)
[facility] Daily Progress Note   Subjective: 3 Days Post-Op 1. Introduction catheter into right lower extremity 3rd order catheter placement  2. Contrast injection right lower extremity for distal runoff  3. Percutaneous transluminal angioplasty to 4 mm with Lutonix drug-eluting balloon right superficial femoral and popliteal arteries. 4. Percutaneous transluminal angioplasty right peroneal to 2.5 mm with an ultra score balloon  5. Star close closure left common femoral arteriotomy  Vascular surgery re-consulted due to daughter being "confused" about previous discussion and on how to proceed.   Patient seems more alert this AM however does not respond to any of my questions. Patient always seen laying on same side of body with pressure directly on her right heel.   Objective: Vitals:   08/25/18 1708 08/26/18 0023 08/26/18 0731 08/26/18 1003  BP: (!) 182/86 (!) 179/82 (!) 165/73 (!) 174/98  Pulse: 98 91 88 92  Resp: 16 18    Temp: 97.7 F (36.5 C) 97.8 F (36.6 C) 98.4 F (36.9 C)   TempSrc: Oral  Oral   SpO2: 100% 98% 100%   Weight:      Height:        Intake/Output Summary (Last 24 hours) at 08/26/2018 1232 Last data filed at 08/26/2018 0400 Gross per 24 hour  Intake 1691.86 ml  Output 900 ml  Net 791.86 ml   Physical Exam: A&Ox3, NAD CV: RRR Pulmonary: CTA Bilaterally Abdomen: Soft, Nontender, Nondistended Left Groin: Access site healing well.  Vascular:  Right Lower Extremity: Thigh soft, calf soft. Extremity warm distally to toes. Unable to palpate pedal pulses but good capillary refill. Unable to assess motor / sensory due to patient mental status.    Laboratory: CBC    Component Value Date/Time   WBC 12.0 (H) 08/23/2018 0515   HGB 9.6 (L) 08/23/2018 0515   HGB 11.0 (L) 01/27/2015 1609   HCT 29.8 (L) 08/23/2018 0515   HCT 36.2 07/22/2017 0026   PLT 424 (H) 08/23/2018  0515   PLT 262 01/27/2015 1609   BMET    Component Value Date/Time   NA 137 08/23/2018 0515   NA 140 04/22/2015 1451   K 3.6 08/23/2018 0515   CL 105 08/23/2018 0515   CO2 24 08/23/2018 0515   GLUCOSE 136 (H) 08/23/2018 0515   BUN 16 08/23/2018 0515   BUN 26 04/22/2015 1451   CREATININE 0.75 08/24/2018 0659   CALCIUM 8.6 (L) 08/23/2018 0515   GFRNONAA >60 08/24/2018 0659   GFRAA >60 08/24/2018 0659   Assessment/Planning: The patient is an 24 year oldbed-boundfemale [facility] at [facility] [place] is nonverbal due toprevious CVA. The patient was sent to the [facility] emergency department yesterday evening at the recommendation of the [facility]'s wound center with a chief complaint of worsening right lower extremity foot wounds s/p RLE angiogram with intervention - POD #3 1) Dr. Delana Meyer will call daughter today and discuss options again. From my understanding podiatry feels the foot is unsalvageable. A BKA is not an option in a bedbound patient as she will contract and experience stump breakdown and need for revision AKA.  2) Angiogram was successful however will not be able to save the patients foot.   Discussed with Dr. Eber Hong Stegmayer PA-C 08/26/2018 12:32 PM

## 2018-08-26 NOTE — Consult Note (Signed)
Pharmacy Antibiotic Note  Abigail Boyd is a 85 y.o. female admitted on 08/20/2018 with Osteomyelitis.  Pharmacy has been consulted for Vancomycin and Cefepime dosing.   Plan: 1) Cefepime 2 g IV q12h   2)  Vancomycin 750 mg IV Q 24 hrs. Goal AUC 400-550. Expected AUC: 406 SCr used: 0.83 Cmin 10.6 Will need to continue to monitor renal function. Scr ordered for AM.  6/29 Plan to order Peak/trough levels to assess therapy.  6/29- continuing to decide on treatment course- either extensive I&D, or amputation or abx/pallative care   Height: 5\' 4"  (162.6 cm) Weight: 143 lb 15.4 oz (65.3 kg) IBW/kg (Calculated) : 54.7  Temp (24hrs), Avg:98 F (36.7 C), Min:97.7 F (36.5 C), Max:98.4 F (36.9 C)  Recent Labs  Lab 08/20/18 1307 08/20/18 1547 08/21/18 0338 08/22/18 0550 08/23/18 0515 08/24/18 0659  WBC 19.2*  --  15.1*  --  12.0*  --   CREATININE 1.24*  --  0.83 0.89 0.90 0.75  LATICACIDVEN 3.0* 1.5  --   --   --   --     Estimated Creatinine Clearance: 43.6 mL/min (by C-G formula based on SCr of 0.75 mg/dL).    Allergies  Allergen Reactions  . Amlodipine Cough  . Clopidogrel Bisulfate Cough  . Kenalog [Triamcinolone Acetonide] Other (See Comments)    unknown  . Lisinopril Cough  . Losartan Cough  . Sulfa Antibiotics Hives and Rash    Antimicrobials this admission: 6/23 Cefepime >> 6/23 Vancomycin >>   Dose adjustments this admission: N/A  Microbiology results: 6/23 BCx: NG Wound Cx pending   Thank you for allowing pharmacy to be a part of this patient's care.  Paulina Fusi, PharmD, BCPS 08/26/2018 2:54 PM

## 2018-08-26 NOTE — Progress Notes (Signed)
I spoke with the patient's daughter for nearly an hour this afternoon.  We discussed in great detail different options for continuing her care.  At this point she would like to continue with palliative care.  She does feel that continued antibiotics even if given orally would be beneficial as it might mitigate the pain and other symptoms of infection.  She voiced understanding that it would not lead to a cure or resolution of the infection.   She brought up the point that she is really not eating nor drinking.  I discussed with her the reality of her mother's situation and her very poor prognosis.  We agreed that we will not give supplemental IV hydration and she is adamant that supplemental feeding via a tube of any sort is not indicated.  We will continue the move toward palliative care.  Another major issue for her is whether her mother would return to her previous facility.  The patient's daughter clearly feels that she received an adequate attention and that her current condition is directly related to the care that she received.  I would hope we could make every effort to find a new facility for the patient.

## 2018-08-26 NOTE — Plan of Care (Signed)
  Problem: Education: Goal: Knowledge of General Education information will improve Description: Including pain rating scale, medication(s)/side effects and non-pharmacologic comfort measures Outcome: Not Progressing   Problem: Pain Managment: Goal: General experience of comfort will improve Outcome: Progressing   Problem: Skin Integrity: Goal: Risk for impaired skin integrity will decrease Outcome: Not Progressing

## 2018-08-27 ENCOUNTER — Ambulatory Visit: Payer: Medicare Other | Admitting: Physician Assistant

## 2018-08-27 DIAGNOSIS — Z7189 Other specified counseling: Secondary | ICD-10-CM

## 2018-08-27 LAB — CBC
HCT: 29.4 % — ABNORMAL LOW (ref 36.0–46.0)
Hemoglobin: 9.2 g/dL — ABNORMAL LOW (ref 12.0–15.0)
MCH: 29.9 pg (ref 26.0–34.0)
MCHC: 31.3 g/dL (ref 30.0–36.0)
MCV: 95.5 fL (ref 80.0–100.0)
Platelets: 427 10*3/uL — ABNORMAL HIGH (ref 150–400)
RBC: 3.08 MIL/uL — ABNORMAL LOW (ref 3.87–5.11)
RDW: 13 % (ref 11.5–15.5)
WBC: 13.5 10*3/uL — ABNORMAL HIGH (ref 4.0–10.5)
nRBC: 0 % (ref 0.0–0.2)

## 2018-08-27 LAB — VANCOMYCIN, TROUGH: Vancomycin Tr: 16 ug/mL (ref 15–20)

## 2018-08-27 LAB — GLUCOSE, CAPILLARY
Glucose-Capillary: 191 mg/dL — ABNORMAL HIGH (ref 70–99)
Glucose-Capillary: 183 mg/dL — ABNORMAL HIGH (ref 70–99)
Glucose-Capillary: 164 mg/dL — ABNORMAL HIGH (ref 70–99)
Glucose-Capillary: 145 mg/dL — ABNORMAL HIGH (ref 70–99)

## 2018-08-27 LAB — CREATININE, SERUM
Creatinine, Ser: 0.83 mg/dL (ref 0.44–1.00)
GFR calc Af Amer: >60 mL/min (ref >60)
GFR calc non Af Amer: >60 mL/min (ref >60)

## 2018-08-27 NOTE — NC FL2 (Signed)
[place] LEVEL OF CARE SCREENING TOOL     IDENTIFICATION  Patient Name: Abigail Boyd Birthdate: [birth year] Sex: female Admission Date (Current Location): 08/20/2018  [place] and [place] Number:  Selena Lesser [id number] [place] and Address:  [facility], [address], [place], [place] [address]      Provider Number: [id number]  Attending Physician Name and Address:  Henreitta Leber, MD  Relative Name and Phone Number:  [name] Daughter [phone]    Current Level of Care: Hospital Recommended Level of Care: [place] Prior Approval Number:    Date Approved/Denied:   PASRR Number: [id number]  Discharge Plan: SNF    Current Diagnoses: Patient Active Problem List   Diagnosis Date Noted  . Pressure injury of skin 08/24/2018  . Osteomyelitis ([place]) 08/20/2018  . Debility 02/22/2018  . Palliative care encounter 02/22/2018  . Memory loss 02/22/2018  . Somnolence 01/19/2018  . Palliative care by specialist   . Altered mental status   . Dysphagia   . Seizure disorder ([place]) 12/21/2017  . Chronic bilateral low back pain with right-sided sciatica 10/15/2017  . Hypertensive heart disease with congestive heart failure and stage 2 kidney disease ([place]) 09/06/2017  . Diabetes mellitus type 2 with neurological manifestations ([place]) 09/06/2017  . Diabetic neuropathy ([place]) 09/06/2017  . Chronic glaucoma 09/06/2017  . Acute CVA (cerebrovascular accident) ([place]) 07/23/2017  . Chronic diastolic CHF (congestive heart failure) ([place]) 07/21/2017  . Chronic cerebrovascular accident (CVA) 06/27/2017  . Generalized weakness 04/23/2017  . Recurrent UTI 04/23/2017  . Anxiety 12/13/2015  . Allergic rhinitis 12/13/2015  . Urge incontinence 03/15/2015  . Constipation 03/15/2015  . Right knee pain 03/15/2015  . CKD stage 2 due to type 2 diabetes mellitus ([place])   . Goals of care, counseling/discussion 06/10/2014  . PLMD (periodic limb  movement disorder) 06/12/2013  . Obstructive sleep apnea 03/05/2013  . Tachy-brady syndrome: Rates range from 50-169 bpm in A. fib 12/20/2012  . History of syncope: Unclear etiology 12/19/2012  . PAF (paroxysmal atrial fibrillation) (HCC);  CHA2DS2-VASc Score =6 on 15 mg Xarelto 05/16/2012    Class: Diagnosis of  . Long term current use of anticoagulant therapy 05/16/2012  . CAD-PCI OM1, 100% RCA (L-R colllaterals) 07/07/2011  . Essential hypertension     Class: Diagnosis of  . Dyslipidemia associated with type 2 diabetes mellitus (HCC)     Class: Diagnosis of  . Diverticulitis 03/21/2011  . GERD (gastroesophageal reflux disease) 03/21/2011  . Goiter 03/21/2011  . History of carpal tunnel syndrome 03/21/2011  . Osteopenia 03/21/2011  . Vitamin D deficiency disease 03/21/2011    Orientation RESPIRATION BLADDER Height & Weight     Self, Place  Normal Incontinent Weight: 65.3 kg Height:  5\' 4"  (162.6 cm)  BEHAVIORAL SYMPTOMS/MOOD NEUROLOGICAL BOWEL NUTRITION STATUS      Incontinent (not eating)  AMBULATORY STATUS COMMUNICATION OF NEEDS Skin   Extensive Assist Non-Verbally Other (Comment)(sacral wound, Right AKA)                       Personal Care Assistance Level of Assistance  Total care Bathing Assistance: Maximum assistance Feeding assistance: Maximum assistance Dressing Assistance: Maximum assistance Total Care Assistance: Maximum assistance   Functional Limitations Info  Sight, Hearing, Speech Sight Info: Adequate Hearing Info: Impaired Speech Info: Impaired(non verbal)    SPECIAL CARE FACTORS FREQUENCY  Contractures Contractures Info: Not present    Additional Factors Info  Code Status Code Status Info: DNR Allergies Info: Amlodipine, Clopidogrel Bisulfate, Kenalog, Triamcinolone Acetonide, Lisinopril, Losartan, Sulfa Antibiotics           Current Medications (08/27/2018):  This is the current hospital active medication  list Current Facility-Administered Medications  Medication Dose Route Frequency Provider Last Rate Last Dose  . 0.9 %  sodium chloride infusion   Intravenous Once Schnier, Dolores Lory, MD      . 0.9 %  sodium chloride infusion   Intravenous Continuous Schnier, Dolores Lory, MD 75 mL/hr at 08/27/18 0500    . 0.9 %  sodium chloride infusion  250 mL Intravenous PRN Schnier, Dolores Lory, MD      . ceFEPIme (MAXIPIME) 2 g in sodium chloride 0.9 % 100 mL IVPB  2 g Intravenous Q12H Schnier, Dolores Lory, MD   Stopped at 08/27/18 0026  . docusate sodium (COLACE) capsule 100 mg  100 mg Oral Daily Schnier, Dolores Lory, MD   100 mg at 08/24/18 0840  . docusate sodium (COLACE) capsule 100 mg  100 mg Oral BID PRN Schnier, Dolores Lory, MD      . enoxaparin (LOVENOX) injection 65 mg  1 mg/kg Subcutaneous Q12H Schnier, Dolores Lory, MD   65 mg at 08/27/18 0145  . feeding supplement (ENSURE ENLIVE) (ENSURE ENLIVE) liquid 237 mL  237 mL Oral BID BM Schnier, Dolores Lory, MD   237 mL at 08/26/18 1416  . FLUoxetine (PROZAC) capsule 40 mg  40 mg Oral Daily Schnier, Dolores Lory, MD   40 mg at 08/26/18 1003  . furosemide (LASIX) tablet 20 mg  20 mg Oral Daily Schnier, Dolores Lory, MD   20 mg at 08/26/18 1006  . gabapentin (NEURONTIN) capsule 300 mg  300 mg Oral TID Katha Cabal, MD   300 mg at 08/26/18 2216  . hydrALAZINE (APRESOLINE) injection 5 mg  5 mg Intravenous Q20 Min PRN Schnier, Dolores Lory, MD      . insulin aspart (novoLOG) injection 0-5 Units  0-5 Units Subcutaneous QHS Schnier, Dolores Lory, MD      . insulin aspart (novoLOG) injection 0-9 Units  0-9 Units Subcutaneous TID WC Schnier, Dolores Lory, MD   2 Units at 08/27/18 [phone]  . labetalol (NORMODYNE) injection 10 mg  10 mg Intravenous Q10 min PRN Schnier, Dolores Lory, MD      . latanoprost (XALATAN) 0.005 % ophthalmic solution 1 drop  1 drop Both Eyes QHS Schnier, Dolores Lory, MD   1 drop at 08/26/18 2217  . levETIRAcetam (KEPPRA) tablet 500 mg  500 mg Oral BID Delana Meyer Dolores Lory, MD    500 mg at 08/26/18 2216  . metoprolol tartrate (LOPRESSOR) tablet 12.5 mg  12.5 mg Oral Daily Schnier, Dolores Lory, MD   12.5 mg at 08/26/18 1004  . morphine 2 MG/ML injection 1 mg  1 mg Intravenous Q4H PRN Schnier, Dolores Lory, MD      . morphine 2 MG/ML injection 2 mg  2 mg Intravenous Q1H PRN Schnier, Dolores Lory, MD      . multivitamin with minerals tablet 1 tablet  1 tablet Oral Daily Schnier, Dolores Lory, MD   1 tablet at 08/26/18 1004  . nitroGLYCERIN (NITROSTAT) SL tablet 0.4 mg  0.4 mg Sublingual Q5 min PRN Schnier, Dolores Lory, MD      . ondansetron [facility]) injection 4 mg  4 mg Intravenous Q6H PRN Schnier, Dolores Lory, MD  4 mg at 08/22/18 0453  . ondansetron (ZOFRAN) injection 4 mg  4 mg Intravenous Q6H PRN Schnier, Dolores Lory, MD      . oxybutynin (DITROPAN) tablet 5 mg  5 mg Oral BID Schnier, Dolores Lory, MD   5 mg at 08/26/18 2216  . oxyCODONE-acetaminophen (PERCOCET/ROXICET) 5-325 MG per tablet 1 tablet  1 tablet Oral Q6H PRN Schnier, Dolores Lory, MD   1 tablet at 08/26/18 2216  . pantoprazole (PROTONIX) EC tablet 40 mg  40 mg Oral Daily Schnier, Dolores Lory, MD   40 mg at 08/26/18 1005  . polyethylene glycol (MIRALAX / GLYCOLAX) packet 17 g  17 g Oral Daily Schnier, Dolores Lory, MD   17 g at 08/24/18 0839  . rOPINIRole (REQUIP) tablet 0.5 mg  0.5 mg Oral QHS Schnier, Dolores Lory, MD   0.5 mg at 08/26/18 2216  . senna (SENOKOT) tablet 17.2 mg  2 tablet Oral QHS Schnier, Dolores Lory, MD   17.2 mg at 08/26/18 2216  . sodium chloride flush (NS) 0.9 % injection 3 mL  3 mL Intravenous Q12H Schnier, Dolores Lory, MD   3 mL at 08/26/18 2216  . sodium chloride flush (NS) 0.9 % injection 3 mL  3 mL Intravenous PRN Schnier, Dolores Lory, MD      . timolol (TIMOPTIC) 0.5 % ophthalmic solution 1 drop  1 drop Both Eyes QHS Schnier, Dolores Lory, MD   1 drop at 08/26/18 2217  . vancomycin (VANCOCIN) IVPB 750 mg/150 ml premix  750 mg Intravenous Q24H Schnier, Dolores Lory, MD   Stopped at 08/26/18 1510  . vitamin C (ASCORBIC ACID)  tablet 250 mg  250 mg Oral BID Schnier, Dolores Lory, MD   250 mg at 08/26/18 2215     Discharge Medications: Please see discharge summary for a list of discharge medications.  Relevant Imaging Results:  Relevant Lab Results:   Additional Information [id number]  Su Hilt, RN

## 2018-08-27 NOTE — Consult Note (Signed)
ANTICOAGULATION CONSULT NOTE - Follow Up Consult  Pharmacy Consult for Enoxaparin  Indication: atrial fibrillation  (on Xarelto PTA)  Allergies  Allergen Reactions  . Amlodipine Cough  . Clopidogrel Bisulfate Cough  . Kenalog [Triamcinolone Acetonide] Other (See Comments)    unknown  . Lisinopril Cough  . Losartan Cough  . Sulfa Antibiotics Hives and Rash    Patient Measurements: Height: 5\' 4"  (162.6 cm) Weight: 143 lb 15.4 oz (65.3 kg) IBW/kg (Calculated) : 54.7   Vital Signs: Temp: 99 F (37.2 C) (06/29 2323) Temp Source: Oral (06/29 2323) BP: 143/93 (06/30 0754) Pulse Rate: 86 (06/30 0754)  Labs: Recent Labs    08/27/18 0259  CREATININE 0.83    Estimated Creatinine Clearance: 42 mL/min (by C-G formula based on SCr of 0.83 mg/dL).   Medications:  Xarelto last given on 08/19/18 @ 1800  Assessment: Patient is a resident a [facility] who presents with a wound infection. Xarelto is being held for possible foot surgery. CrCl < 30 mL/min  6/26: SCr 0.9 CrCl 38.7 ml/min  TBW Crcl 46.2 ml/min 6/30  Scr 0.83 Crcl 42 ml/min  Goal of Therapy:  Monitor platelets by anticoagulation protocol: Yes   Plan:   Enoxaparin 1 mg/kg SQ  q12h. (65 mg q12h) Will need SCr and CBC at least every three days per policy.   Pharmacy will continue to follow.   Caden Fukushima A 08/27/2018,10:11 AM

## 2018-08-27 NOTE — Consult Note (Signed)
Pharmacy Antibiotic Note  Abigail Boyd is a 85 y.o. female admitted on 08/20/2018 with Osteomyelitis.  Pharmacy has been consulted for Vancomycin and Cefepime dosing.   Plan: 1) Cefepime 2 g IV q12h   2)  Vancomycin 750 mg IV Q 24 hrs. Goal AUC 400-550. Expected AUC: 406 SCr used: 0.83 Cmin 10.6 Will need to continue to monitor renal function. Scr ordered for AM.    6/30- Vanc Peak= 31/Trough=16. Per Vancomycin calculator, calculated AUC= 555.  Will continue current regimen of 750mg  IV q24h for now. Discussed with ID Pharmacist. Monitor Scr.  Family deciding amputation vs palliative care    Height: 5\' 4"  (162.6 cm) Weight: 143 lb 15.4 oz (65.3 kg) IBW/kg (Calculated) : 54.7  Temp (24hrs), Avg:98.9 F (37.2 C), Min:98.8 F (37.1 C), Max:99 F (37.2 C)  Recent Labs  Lab 08/20/18 1547 08/21/18 0338 08/22/18 0550 08/23/18 0515 08/24/18 0659 08/26/18 1634 08/27/18 0259 08/27/18 1338  WBC  --  15.1*  --  12.0*  --   --   --  13.5*  CREATININE  --  0.83 0.89 0.90 0.75  --  0.83  --   LATICACIDVEN 1.5  --   --   --   --   --   --   --   VANCOTROUGH  --   --   --   --   --   --   --  16  VANCOPEAK  --   --   --   --   --  31  --   --     Estimated Creatinine Clearance: 42 mL/min (by C-G formula based on SCr of 0.83 mg/dL).    Allergies  Allergen Reactions  . Amlodipine Cough  . Clopidogrel Bisulfate Cough  . Kenalog [Triamcinolone Acetonide] Other (See Comments)    unknown  . Lisinopril Cough  . Losartan Cough  . Sulfa Antibiotics Hives and Rash    Antimicrobials this admission: 6/23 Cefepime >> 6/23 Vancomycin >>   Dose adjustments this admission: 6/30:  Vanc AUC 555- continue current dose of 750mg  IV q24h  Microbiology results: 6/23 BCx: NG Wound Cx pending   Thank you for allowing pharmacy to be a part of this patient's care.  Chinita Greenland PharmD Clinical Pharmacist 08/27/2018

## 2018-08-27 NOTE — Care Management Important Message (Signed)
Important Message  Patient Details  Name: Abigail Boyd MRN: [id number] Date of Birth: [birth year]   Medicare Important Message Given:  Yes     Su Hilt, RN 08/27/2018, 11:10 AM

## 2018-08-27 NOTE — Progress Notes (Signed)
[place] [facility] [place] NAME: Abigail Boyd    MR#:  [id number]  DATE OF BIRTH:  [birth year]  SUBJECTIVE:   Patient here due to a right heel/foot ulcer/infection and noted to have osteomyelitis.    Pt. Remains non-verbal and no acute complaints or events overnight.   REVIEW OF SYSTEMS:    Review of Systems  Unable to perform ROS: Mental acuity    Nutrition: Soft diet Tolerating Diet: Yes Tolerating PT: bedbound  DRUG ALLERGIES:   Allergies  Allergen Reactions  . Amlodipine Cough  . Clopidogrel Bisulfate Cough  . Kenalog [Triamcinolone Acetonide] Other (See Comments)    unknown  . Lisinopril Cough  . Losartan Cough  . Sulfa Antibiotics Hives and Rash    VITALS:  Blood pressure (!) 163/69, pulse 87, temperature 99 F (37.2 C), temperature source Oral, resp. rate 17, height 5\' 4"  (1.626 m), weight 65.3 kg, SpO2 98 %.  PHYSICAL EXAMINATION:   Physical Exam  GENERAL:  85 y.o.-year-old patient lying in bed non-verbal but in NAD.  EYES: Pupils equal, round, reactive to light and accommodation. No scleral icterus. Extraocular muscles intact.  HEENT: Head atraumatic, normocephalic. Oropharynx and nasopharynx clear.  NECK:  Supple, no jugular venous distention. No thyroid enlargement, no tenderness.  LUNGS: Normal breath sounds bilaterally, no wheezing, rales, rhonchi. No use of accessory muscles of respiration.  CARDIOVASCULAR: S1, S2 normal. No murmurs, rubs, or gallops.  ABDOMEN: Soft, nontender, nondistended. Bowel sounds present. No organomegaly or mass.  EXTREMITIES: No cyanosis, clubbing or edema b/l. Right heel ulcer with dressing in place    NEUROLOGIC: Cranial nerves II through XII are intact. No focal Motor or sensory deficits b/l. Globally weak.    PSYCHIATRIC: The patient is alert and oriented x 1.   SKIN: No obvious rash, lesion, right hell ulcer with   large black eschar on the posterior right heel is approximately 6 cm  x 7 cm there is some mild erythema along the periphery         LABORATORY PANEL:   CBC Recent Labs  Lab 08/27/18 1338  WBC 13.5*  HGB 9.2*  HCT 29.4*  PLT 427*   ------------------------------------------------------------------------------------------------------------------  Chemistries  Recent Labs  Lab 08/23/18 0515  08/27/18 0259  NA 137  --   --   K 3.6  --   --   CL 105  --   --   CO2 24  --   --   GLUCOSE 136*  --   --   BUN 16  --   --   CREATININE 0.90   < > 0.83  CALCIUM 8.6*  --   --   MG 2.0  --   --    < > = values in this interval not displayed.   ------------------------------------------------------------------------------------------------------------------  Cardiac Enzymes No results for input(s): TROPONINI in the last 168 hours. ------------------------------------------------------------------------------------------------------------------  RADIOLOGY:  No results found.   ASSESSMENT AND PLAN:   85 year old female with past medical history of diabetes with CKD stage III, paroxysmal atrial fibrillation, coronary artery disease, essential hypertension, history of previous CVA who presented to the hospital due to a foul-smelling right heel ulcer.  1.  Diabetic ulcer on the right heel- suspected to be secondary to underlying osteomyelitis.  X-ray was negative but MRI confirmed osteomyelitis. -Seen by both podiatry and vascular surgery. -Patient underwent angiogram 3 days ago with angioplasty to the right lower extremity to improve blood flow. -Vascular surgery  discussed extensively with patient's daughter about surgical options and she does not want any aggressive care at this time and wants to pursue more comfort measures. -Await further input from palliative care with the discussion with the patient's daughter.  2.  Diabetes type 2 with CKD stage III- continue sliding scale insulin, follow blood sugars.  3.  Paroxysmal atrial  fibrillation- rate controlled. -Continue metoprolol.  Xarelto currently on hold for now.   4.  Neuropathy secondary to diabetes- continue gabapentin.  5.  Depression- continue Prozac.  6.  Restless leg syndrome- continue Requip.  7.  Glaucoma- continue timolol eyedrops.  8.  GERD- continue Protonix.  9.  History of urinary incontinence- continue oxybutynin.  His prognosis is poor given her advanced age comorbidities.  Palliative care discuss further goals of care with the patient's daughter today.  Patient's daughter does not want surgical intervention for the right foot osteomyelitis.  All the records are reviewed and case discussed with Care Management/Social Worker. Management plans discussed with the patient, family and they are in agreement.  CODE STATUS: DNR  DVT Prophylaxis: Lovenox  TOTAL TIME TAKING CARE OF THIS PATIENT: 30 minutes.   POSSIBLE D/C IN 1-2 DAYS, DEPENDING ON CLINICAL CONDITION.   Henreitta Leber M.D on 08/27/2018 at 2:58 PM  Between 7am to 6pm - Pager - [phone]  After 6pm go to [email or web address] - Technical brewer [place] [facility]  Office  [phone]  CC: Primary care physician; Kirk Ruths, MD

## 2018-08-27 NOTE — TOC Progression Note (Signed)
Transition of Care [facility]) - Progression Note    Patient Details  Name: Abigail Boyd MRN: [id number] Date of Birth: October 05, 1929  Transition of Care [facility]) CM/SW [place], RN Phone Number: 08/27/2018, 9:39 AM  Clinical Narrative:     Attempted to reach the Daughter [name], at [phone] to discuss Long term care options, unable to reach, left a VM requesting a return call.  I completed an FL2 and PASSR and sent bed search for a long term care bed Palliative.  Will review with the daughter if there are any bed offers  Expected Discharge Plan: [place] Barriers to Discharge: Continued Medical Work up  Expected Discharge Plan and Services Expected Discharge Plan: [place] In-house Referral: Clinical Social Work Discharge Planning Services: CM Consult   Living arrangements for the past 2 months: [place]                                       Social Determinants of Health (SDOH) Interventions    Readmission Risk Interventions No flowsheet data found.

## 2018-08-27 NOTE — Progress Notes (Signed)
Nutrition Follow-up  RD working remotely.  DOCUMENTATION CODES:   Not applicable  INTERVENTION:  Continue Ensure Enlive po BID, each supplement provides 350 kcal and 20 grams of protein.  Continue daily MVI and vitamin C 250 mg BID.  NUTRITION DIAGNOSIS:   Increased nutrient needs related to wound healing as evidenced by estimated needs.  Ongoing.  GOAL:   Patient will meet greater than or equal to 90% of their needs  Not progressing.  MONITOR:   PO intake, Supplement acceptance, Labs, Weight trends, Skin, I & O's  REASON FOR ASSESSMENT:   Malnutrition Screening Tool    ASSESSMENT:   85 y.o. female with a known history of coronary artery disease status post stent, chronic kidney disease stage II, diabetes, hypertension, stroke- resides at [facility] for more than 1 year due to progressive weakness. At her baseline she is bedbound and non-verbal  Patient still has very poor PO intake. Per chart she is eating 20% or less at meals. Family has been meeting with PMT to discuss goals of care. They do not want any nutrition support or IV fluids. They will have a follow-up meeting today.  Medications reviewed and include: Colace 100 mg daily, Lasix 20 mg daily, gabapentin, Novlog 0-9 units TID, Novolog 0-5 units QHS, Keppra, MVI daily, pantoprazole, Miralax 17 grams daily, senna, vitamin C 250 mg BID, NS at 75 ml/hr, cefepime, vancomycin.  Labs reviewed: CBG 156-193.  Diet Order:   Diet Order            DIET SOFT Room service appropriate? Yes; Fluid consistency: Thin  Diet effective now             EDUCATION NEEDS:   Not appropriate for education at this time  Skin:  Skin Assessment: Skin Integrity Issues:(stg II sacrum; necrotic wound right heel)  Last BM:  08/27/2018 per chart  Height:   Ht Readings from Last 1 Encounters:  08/23/18 5\' 4"  (1.626 m)   Weight:   Wt Readings from Last 1 Encounters:  08/23/18 65.3 kg   Ideal Body Weight:  54.5  kg  BMI:  Body mass index is 24.71 kg/m.  Estimated Nutritional Needs:   Kcal:  1400-1600kcal/day  Protein:  70-80g/day  Fluid:  1.4L/day  Willey Blade, MS, RD, LDN Office: [phone] Pager: [phone] After Hours/Weekend Pager: [phone]

## 2018-08-27 NOTE — Progress Notes (Signed)
Palliative: Abigail Boyd is sitting up quietly in bed, her eyes are closed.  She wakes easily, and will make and mostly keep eye contact.  She is able to make her basic needs known, but is mostly nonverbal due to previous stroke.  She appears acutely/chronically ill and frail.  I inspect and palpate her lower extremities, she does not move under my touch.  When I look at her face, she looks tearful, but denies pain at this time.  She declines to take anything to drink from me.  there is no family at bedside at this time due to visitor restrictions.  Conference with attending, bedside nursing staff and RN case management related to patient condition, needs, disposition.  Call to daughter, Loreli Slot at [phone].  Call to work #[phone], missed request call to cell or home number, no message left.  Call to home #[phone], generic voicemail message left.  Message that daughter [name] is available to talk after 2pm  Call to daughter [name] at [phone]. We talked about returning to residential SNF whether at [facility] or [facility] where she has been accepted.  [name] goes on at length about her concern about her mother's decline at [facility].    We talk about residential hospice, that by mouth antibiotics are usually not given because the concept is to let nature take its course.  [name] tells me that she has some concern about not providing antibiotics for her mother and states several times that the vascular surgeon told her that she could have antibiotics at hospice "for comfort".  We talked about how comfort care is provided including medications for pain and anxiety, positioning, skin care.    [name] tells me that at this point her goal is qualifying for residential hospice. We may Santiago Glad yes take discussed that if Mrs. Dizdarevic stabilizes after around 2-1/2 weeks at residential hospice, they will assist her in finding residential SNF and be discharged with  hospice to her SNF facility.    Conference with hospice liaison.  The goal of residential hospice is to let nature take its course.  It is unlikely that residential hospice would accept any patient whose goal is continue to treat the treatable.  Residential hospice is specifically for end-of-life care, those with weeks to live.  Plan: Daughter [name] states her goal is residential hospice, but shares that she is unsure if she could accept no further antibiotics.  At this point it is questionable if Mrs. Hochberg would qualify for residential hospice.  she came in with lactic acid of 1.5, and has had 7 days of IV antibiotics.  Last WBC of 12.0 on 6/26.  If WBCs are continuing to increase, this would help qualify her for residential hospice, especially if family is ready to discontinue antibiotics and focus on comfort only. WBC 6/30 show increase to 13.5 even with IV antibiotics.   Prognosis: Difficulty in this case.  Based on daughter's willingness to forego antibiotics. PMT plans follow up call for 6/31 after lunch.   65 minutes, extended time Quinn Axe, NP Palliative Medicine Team Team Phone # [phone] Greater than 50% of this time was spent counseling and coordinating care related to the above assessment and plan.

## 2018-08-28 DIAGNOSIS — A419 Sepsis, unspecified organism: Secondary | ICD-10-CM

## 2018-08-28 DIAGNOSIS — L0390 Cellulitis, unspecified: Secondary | ICD-10-CM

## 2018-08-28 LAB — GLUCOSE, CAPILLARY
Glucose-Capillary: 176 mg/dL — ABNORMAL HIGH (ref 70–99)
Glucose-Capillary: 116 mg/dL — ABNORMAL HIGH (ref 70–99)
Glucose-Capillary: 149 mg/dL — ABNORMAL HIGH (ref 70–99)
Glucose-Capillary: 160 mg/dL — ABNORMAL HIGH (ref 70–99)

## 2018-08-28 NOTE — Progress Notes (Signed)
Palliative: Abigail Boyd is is resting quietly in bed.  She will make and briefly keep eye contact.  She shakes her head when I ask if she will take some water or tea.  She does not respond to me when I ask her other questions. I touch her leg and ask if she is having pain, she does not respond, but becomes tearful and turn her head away.  There is no family at bedside at this time due to visitor restrictions.   Call to daughter [name] at [phone]. Left generic VM. Call to home number, no VM message left. No return call from daughter after several hours.   Conference with RN CM and hospice liaison regarding disposition, hospice home referral.   Plan:  At this point it is not clear that Mrs. Laatsch qualifies for residential/inpatient hospice even with stopping of antibiotics.  It seems that she will need to return to residential SNF with [facility] or to [place] with hospice services.  She would benefit from transfer directly to residential hsopice when she worsens. Unfortunately this is difficult to prognosticate.    65 minutes, extended time Quinn Axe, NP Palliative Medicine Team Team Phone # [phone] Greater than 50% of this time was spent counseling and coordinating care related to the above assessment and plan.

## 2018-08-28 NOTE — Progress Notes (Addendum)
[place] at [place] NAME: Abigail Boyd    MR#:  [id number]  DATE OF BIRTH:  December 25, 1929  SUBJECTIVE:   Patient here due to a right heel/foot ulcer/infection and noted to have osteomyelitis.    Pt. Remains non-verbal and no acute complaints or events overnight.   REVIEW OF SYSTEMS:    Review of Systems  Unable to perform ROS: Mental acuity    Nutrition: Soft diet Tolerating Diet: Yes Tolerating PT: bedbound  DRUG ALLERGIES:   Allergies  Allergen Reactions  . Amlodipine Cough  . Clopidogrel Bisulfate Cough  . Kenalog [Triamcinolone Acetonide] Other (See Comments)    unknown  . Lisinopril Cough  . Losartan Cough  . Sulfa Antibiotics Hives and Rash    VITALS:  Blood pressure (!) 144/59, pulse 77, temperature 98 F (36.7 C), resp. rate 14, height 5\' 4"  (1.626 m), weight 65.3 kg, SpO2 97 %.  PHYSICAL EXAMINATION:   Physical Exam  GENERAL:  85 y.o.-year-old patient lying in bed non-verbal but in NAD.  EYES: Pupils equal, round, reactive to light and accommodation. No scleral icterus. Extraocular muscles intact.  HEENT: Head atraumatic, normocephalic. Oropharynx and nasopharynx clear.  NECK:  Supple, no jugular venous distention. No thyroid enlargement, no tenderness.  LUNGS: Normal breath sounds bilaterally, no wheezing, rales, rhonchi. No use of accessory muscles of respiration.  CARDIOVASCULAR: S1, S2 normal. No murmurs, rubs, or gallops.  ABDOMEN: Soft, nontender, nondistended. Bowel sounds present. No organomegaly or mass.  EXTREMITIES: No cyanosis, clubbing or edema b/l. Right heel ulcer with dressing in place    NEUROLOGIC: Cranial nerves II through XII are intact. No focal Motor or sensory deficits b/l. Globally weak.    PSYCHIATRIC: The patient is alert and oriented x 1.   SKIN: No obvious rash, lesion, right hell ulcer with   large black eschar on the posterior right heel is approximately 6 cm x 7 cm there is some mild  erythema along the periphery         LABORATORY PANEL:   CBC Recent Labs  Lab 08/27/18 1338  WBC 13.5*  HGB 9.2*  HCT 29.4*  PLT 427*   ------------------------------------------------------------------------------------------------------------------  Chemistries  Recent Labs  Lab 08/23/18 0515  08/27/18 0259  NA 137  --   --   K 3.6  --   --   CL 105  --   --   CO2 24  --   --   GLUCOSE 136*  --   --   BUN 16  --   --   CREATININE 0.90   < > 0.83  CALCIUM 8.6*  --   --   MG 2.0  --   --    < > = values in this interval not displayed.   ------------------------------------------------------------------------------------------------------------------  Cardiac Enzymes No results for input(s): TROPONINI in the last 168 hours. ------------------------------------------------------------------------------------------------------------------  RADIOLOGY:  No results found.   ASSESSMENT AND PLAN:   85 year old female with past medical history of diabetes with CKD stage III, paroxysmal atrial fibrillation, coronary artery disease, essential hypertension, history of previous CVA who presented to the hospital due to a foul-smelling right heel ulcer.  1.  Diabetic ulcer on the right heel- suspected to be secondary to underlying osteomyelitis.  X-ray was negative but MRI confirmed osteomyelitis. -Seen by both podiatry and vascular surgery. -Patient underwent angiogram 3 days ago with angioplasty to the right lower extremity to improve blood flow. -Vascular surgery discussed extensively with  patient's daughter about surgical options and she does not want any aggressive care at this time and wants to pursue more comfort measures. Per palliative care consult, Daughter [name] states her goal is residential hospice, but shares that she is unsure if she could accept no further antibiotics.  At this point it is questionable if Mrs. Holness would qualify for residential hospice.    2.  Diabetes type 2 with CKD stage III- continue sliding scale insulin, follow blood sugars.  3.  Paroxysmal atrial fibrillation- rate controlled. -Continue metoprolol.  Xarelto currently on hold for now.   4.  Neuropathy secondary to diabetes- continue gabapentin.  5.  Depression- continue Prozac.  6.  Restless leg syndrome- continue Requip.  7.  Glaucoma- continue timolol eyedrops.  8.  GERD- continue Protonix.  9.  History of urinary incontinence- continue oxybutynin.  I called the patient's daughter, but nobody answered the phone..  All the records are reviewed and case discussed with Care Management/Social Worker. Management plans discussed with the patient, family and they are in agreement.  CODE STATUS: DNR  DVT Prophylaxis: Lovenox  TOTAL TIME TAKING CARE OF THIS PATIENT: 30 minutes.   POSSIBLE D/C IN 1-2 DAYS, DEPENDING ON CLINICAL CONDITION.   Demetrios Loll M.D on 08/28/2018 at 2:05 PM  Between 7am to 6pm - Pager - [phone]  After 6pm go to [email or web address] - Technical brewer [place] [facility]  Office  [phone]  CC: Primary care physician; Kirk Ruths, MD

## 2018-08-28 NOTE — TOC Progression Note (Signed)
Transition of Care [facility]) - Progression Note    Patient Details  Name: MENDE BISWELL MRN: [id number] Date of Birth: Nov 25, 1929  [facility]) CM/SW Contact  Su Hilt, RN Phone Number: 08/28/2018, 4:26 PM  Clinical Narrative:     Spoke with Daughter [name], we reviewed the bed offers for long term care placement.  I let her know it is advised that she go to medicare.gov and review the star rating.  I explained that it is her choice to continue ABX or not.  Alesia asked about the patient not wanting to et.  I explained that is a normal part of the process when a person gets near the end of life.  I explained that I am not a doctor and I am not saying that she is at the end of her life however when that does happen it is not uncommon for a patient to not want to eat or drink, it is very common for the person to sleep more as well.  I requested that she call Tasha Done the NP with Palliative and provided her with the number, she agreed to call Tasha.  She will review medicare . gov and call me with a choice of facilities  Expected Discharge Plan: [place] Barriers to Discharge: Continued Medical Work up  Expected Discharge Plan and Services Expected Discharge Plan: [place] In-house Referral: Clinical Social Work Discharge Planning Services: CM Consult   Living arrangements for the past 2 months: [place]                                       Social Determinants of Health (SDOH) Interventions    Readmission Risk Interventions No flowsheet data found.

## 2018-08-28 NOTE — Progress Notes (Signed)
When went in to give patient her night medications, she was refusing to take anything for me PO. Non administered PO night medications, but will reassess later.

## 2018-08-28 NOTE — Plan of Care (Signed)
  Problem: Education: Goal: Knowledge of General Education information will improve Description: Including pain rating scale, medication(s)/side effects and non-pharmacologic comfort measures Outcome: Progressing   Problem: Pain Managment: Goal: General experience of comfort will improve Outcome: Progressing   Problem: Skin Integrity: Goal: Risk for impaired skin integrity will decrease Outcome: Progressing   

## 2018-08-28 NOTE — Progress Notes (Addendum)
SLP Cancellation Note  Patient Details Name: Abigail Boyd MRN: [id number] DOB: 07-Jun-1929   Cancelled treatment:       Reason Eval/Treat Not Completed: Patient declined, no reason specified(chart reviewed; consulted NSG re: pt's status). NSG reported pt was agreeable to swallowing pills in puree earlier this morning w/ No overt s/s of aspiration noted, but NA reported pt refused po's at breakfast meal. At this lunch meal, pt again refused to accept any po's of drink (milk which is a favorite of pt's) and/or foods despite encouragement from SLP and the NA again. Pt became labile when offered the food/drink. NA reported is more agreeable to eating/drinking w/ her Daughter when she visits in the evenings. Reviewed chart notes; labs, CXR - "No active disease." at admission. Suspect pt's presentation and refusal of po's at times is related to her Cognitive decline - "Memory loss R41.3 secondary to late onset CVA appears progressive", per Palliative Care consult note in chart dated 01/2018. Pt also has aphasia w/ progressive decline s/p old strokes in 2016, 2019 per chart notes. Per report, pt has had a decline in verbal communication in the past year. Pt also has a dx of Depression per chart note. Per NA, pt has had a decline in status and weight since she worked w/ her at [employer].  Due to NSG report of pt exhibiting increased munching and mastication of the whole pills in puree and Cognitive decline impacting oral phase and desire for intake, recommend a more Minced diet consistency for easier mastication(oral phase) and oral intake overall. Recommend continued general aspiration precautions posted; support w/ feeding at meals. Recommend f/u w/ Palliative Care team for further education and information for pt/family. NSG to continue to present pills in puree in a form most consistent and safe for oral intake, swallowing. Recommend Dietician f/u for nutritional support, preferences.   NSG to reconsult ST  services if any decline in status while admitted.      Orinda Kenner, MS, CCC-SLP Taggert Bozzi 08/28/2018, 3:03 PM

## 2018-08-29 LAB — BASIC METABOLIC PANEL
Anion gap: 10 (ref 5–15)
BUN: 12 mg/dL (ref 8–23)
CO2: 21 mmol/L — ABNORMAL LOW (ref 22–32)
Calcium: 8.5 mg/dL — ABNORMAL LOW (ref 8.9–10.3)
Chloride: 106 mmol/L (ref 98–111)
Creatinine, Ser: 0.89 mg/dL (ref 0.44–1.00)
GFR calc Af Amer: >60 mL/min (ref >60)
GFR calc non Af Amer: 59 mL/min — ABNORMAL LOW (ref >60)
Glucose, Bld: 191 mg/dL — ABNORMAL HIGH (ref 70–99)
Potassium: 3 mmol/L — ABNORMAL LOW (ref 3.5–5.1)
Sodium: 137 mmol/L (ref 135–145)

## 2018-08-29 LAB — CBC
HCT: 30.7 % — ABNORMAL LOW (ref 36.0–46.0)
Hemoglobin: 9.7 g/dL — ABNORMAL LOW (ref 12.0–15.0)
MCH: 30 pg (ref 26.0–34.0)
MCHC: 31.6 g/dL (ref 30.0–36.0)
MCV: 95 fL (ref 80.0–100.0)
Platelets: 434 10*3/uL — ABNORMAL HIGH (ref 150–400)
RBC: 3.23 MIL/uL — ABNORMAL LOW (ref 3.87–5.11)
RDW: 12.9 % (ref 11.5–15.5)
WBC: 11.9 10*3/uL — ABNORMAL HIGH (ref 4.0–10.5)
nRBC: 0 % (ref 0.0–0.2)

## 2018-08-29 LAB — GLUCOSE, CAPILLARY
Glucose-Capillary: 163 mg/dL — ABNORMAL HIGH (ref 70–99)
Glucose-Capillary: 177 mg/dL — ABNORMAL HIGH (ref 70–99)
Glucose-Capillary: 179 mg/dL — ABNORMAL HIGH (ref 70–99)
Glucose-Capillary: 199 mg/dL — ABNORMAL HIGH (ref 70–99)
Glucose-Capillary: 177 mg/dL — ABNORMAL HIGH (ref 70–99)
Glucose-Capillary: 170 mg/dL — ABNORMAL HIGH (ref 70–99)

## 2018-08-29 LAB — SARS CORONAVIRUS 2 BY RT PCR (HOSPITAL ORDER, PERFORMED IN ~~LOC~~ HOSPITAL LAB): SARS Coronavirus 2: NEGATIVE

## 2018-08-29 LAB — MAGNESIUM: Magnesium: 2 mg/dL (ref 1.7–2.4)

## 2018-08-29 MED ORDER — POTASSIUM CHLORIDE 20 MEQ/15ML (10%) PO SOLN
40.0000 meq | Freq: Two times a day (BID) | ORAL | Status: DC
  Filled 2018-08-29 (×2): qty 30

## 2018-08-29 MED ORDER — POTASSIUM CHLORIDE CRYS ER 20 MEQ PO TBCR: 40.0000 meq | EXTENDED_RELEASE_TABLET | Freq: Once | ORAL | Status: DC

## 2018-08-29 NOTE — Progress Notes (Addendum)
Palliative: Abigail Boyd is sitting up in bed.  She is alert, but nonverbal mostly.  She will make but not keep eye contact, appears acutely/chronically ill and frail.  She has episodes of crying frequently.  She does not answer my questions today.  There is no family at bedside at this time due to visitor restrictions.  Call to daughter Abigail Boyd at [phone].  Left generic VM message.    Conference with attending and RN CM related to patient condition, needs, disposition. PMT has been unable to contact daughter Abigail Boyd.  At this point, daughter Abigail Boyd has not shared with medical team that she would like to stop antibiotics and let nature take its course.  It seems that the goal is to continue antibiotics.  Plan:  DC to rehab (choice of facility yet to be determined), continue IV antibiotics, Palliative care to follow.   74 minutes Quinn Axe, NP Palliative Medicine Team Team Phone # [phone] Greater than 50% of this time was spent counseling and coordinating care related to the above assessment and plan.

## 2018-08-29 NOTE — Progress Notes (Signed)
[place] at [place] NAME: Abigail Boyd    MR#:  [id number]  DATE OF BIRTH:  01-18-1930  SUBJECTIVE:   Patient here due to a right heel/foot ulcer/infection and noted to have osteomyelitis.    Pt. Remains non-verbal.  REVIEW OF SYSTEMS:    Review of Systems  Unable to perform ROS: Mental acuity    Nutrition: Soft diet Tolerating Diet: Yes Tolerating PT: bedbound  DRUG ALLERGIES:   Allergies  Allergen Reactions  . Amlodipine Cough  . Clopidogrel Bisulfate Cough  . Kenalog [Triamcinolone Acetonide] Other (See Comments)    unknown  . Lisinopril Cough  . Losartan Cough  . Sulfa Antibiotics Hives and Rash    VITALS:  Blood pressure (!) 167/75, pulse 92, temperature 97.9 F (36.6 C), resp. rate 18, height 5\' 4"  (1.626 m), weight 65.3 kg, SpO2 100 %.  PHYSICAL EXAMINATION:   Physical Exam  GENERAL:  85 y.o.-year-old patient lying in bed non-verbal but in NAD.  EYES: Pupils equal, round, reactive to light and accommodation. No scleral icterus. Extraocular muscles intact.  HEENT: Head atraumatic, normocephalic. Oropharynx and nasopharynx clear.  NECK:  Supple, no jugular venous distention. No thyroid enlargement, no tenderness.  LUNGS: Normal breath sounds bilaterally, no wheezing, rales, rhonchi. No use of accessory muscles of respiration.  CARDIOVASCULAR: S1, S2 normal. No murmurs, rubs, or gallops.  ABDOMEN: Soft, nontender, nondistended. Bowel sounds present. No organomegaly or mass.  EXTREMITIES: No cyanosis, clubbing or edema b/l. Right heel ulcer with dressing in place    NEUROLOGIC: Cranial nerves II through XII are intact. No focal Motor or sensory deficits b/l. Globally weak.    PSYCHIATRIC: The patient is alert and oriented x 1.   SKIN: No obvious rash, lesion, right hell ulcer with   large black eschar on the posterior right heel is approximately 6 cm x 7 cm there is some mild erythema along the periphery          LABORATORY PANEL:   CBC Recent Labs  Lab 08/29/18 0515  WBC 11.9*  HGB 9.7*  HCT 30.7*  PLT 434*   ------------------------------------------------------------------------------------------------------------------  Chemistries  Recent Labs  Lab 08/29/18 0515  NA 137  K 3.0*  CL 106  CO2 21*  GLUCOSE 191*  BUN 12  CREATININE 0.89  CALCIUM 8.5*  MG 2.0   ------------------------------------------------------------------------------------------------------------------  Cardiac Enzymes No results for input(s): TROPONINI in the last 168 hours. ------------------------------------------------------------------------------------------------------------------  RADIOLOGY:  No results found.   ASSESSMENT AND PLAN:   85 year old female with past medical history of diabetes with CKD stage III, paroxysmal atrial fibrillation, coronary artery disease, essential hypertension, history of previous CVA who presented to the hospital due to a foul-smelling right heel ulcer.  1.  Diabetic ulcer on the right heel- suspected to be secondary to underlying osteomyelitis.  X-ray was negative but MRI confirmed osteomyelitis. -Seen by both podiatry and vascular surgery. -Patient underwent angiogram with angioplasty to the right lower extremity to improve blood flow. -Vascular surgery discussed extensively with patient's daughter about surgical options and she does not want any aggressive care at this time and wants to pursue more comfort measures. Per palliative care consult, Daughter [name] states her goal is residential hospice, but shares that she is unsure if she could accept no further antibiotics.  At this point it is questionable if Mrs. Kops would qualify for residential hospice.  Continue cefepime and vancomycin for now.  2.  Diabetes type 2 with CKD  stage III- continue sliding scale insulin, follow blood sugars.  3.  Paroxysmal atrial fibrillation- rate controlled. -Continue  metoprolol.  Xarelto currently on hold for now.   4.  Neuropathy secondary to diabetes- continue gabapentin.  5.  Depression- continue Prozac.  6.  Restless leg syndrome- continue Requip.  7.  Glaucoma- continue timolol eyedrops.  8.  GERD- continue Protonix.  9.  History of urinary incontinence- continue oxybutynin.  Hypokalemia.  Potassium supplement.  I called the patient's daughter, but nobody answered the phone..  All the records are reviewed and case discussed with Care Management/Social Worker. Management plans discussed with the patient, family and they are in agreement.  CODE STATUS: DNR  DVT Prophylaxis: Lovenox  TOTAL TIME TAKING CARE OF THIS PATIENT: 28 minutes.   POSSIBLE D/C IN 1-2 DAYS, DEPENDING ON CLINICAL CONDITION.   Demetrios Loll M.D on 08/29/2018 at 1:23 PM  Between 7am to 6pm - Pager - [phone]  After 6pm go to [email or web address] - Technical brewer [place] [facility]  Office  [phone]  CC: Primary care physician; Kirk Ruths, MD

## 2018-08-29 NOTE — Care Management Important Message (Signed)
Important Message  Patient Details  Name: Abigail Boyd MRN: [id number] Date of Birth: 1929-09-15   Medicare Important Message Given:  Yes     Su Hilt, RN 08/29/2018, 3:08 PM

## 2018-08-29 NOTE — Progress Notes (Signed)
[facility]   Vascular Communication Note  Subjective: 6 Days Post-Op: 1. Introduction catheter intorightlower extremity 3rd order catheter placement  2.Contrast injectionrightlower extremity for distal runoff  3. Percutaneous transluminal angioplastyto 4 mm with Lutonix drug-eluting balloon rightsuperficial femoral and popliteal arteries. 4. Percutaneous transluminal angioplastyright peroneal to 2.5 mm with an ultra score balloon  5. Star close closure leftcommon femoral arteriotomy  Family has chosen to pursue hospice care at this time. No need for outpatient follow up. Vascular Surgery will sign off at this time. Please re-consult if plan changes.  Discussed with Tamera Stands PA-C 08/29/2018 11:19 AM

## 2018-08-29 NOTE — TOC Progression Note (Signed)
Transition of Care [facility]) - Progression Note    Patient Details  Name: Abigail Boyd MRN: [id number] Date of Birth: 06-Aug-1929  Transition of Care [facility]) CM/SW [place], RN Phone Number: 08/29/2018, 3:22 PM  Clinical Narrative:    The daughter Bailey Mech called and stated that she reviewed the options and the STAR ratings and they have decided to go back to [facility].  I called Magda Paganini at [facility] and let her know.  The patient will need a new Covid test before DC   Expected Discharge Plan: [place] Barriers to Discharge: Continued Medical Work up  Expected Discharge Plan and Services Expected Discharge Plan: [place] In-house Referral: Clinical Social Work Discharge Planning Services: CM Consult   Living arrangements for the past 2 months: [place]                                       Social Determinants of Health (SDOH) Interventions    Readmission Risk Interventions No flowsheet data found.

## 2018-08-29 NOTE — TOC Progression Note (Signed)
Transition of Care [facility]) - Progression Note    Patient Details  Name: Abigail Boyd MRN: [id number] Date of Birth: 06/21/1929  Transition of Care [facility]) CM/SW [place], RN Phone Number: 08/29/2018, 1:59 PM  Clinical Narrative:     Spoke with the patients Daughter [name], I explained that we need to get a bed choice to be able to accept the bed offer.  I explained that the facilities beds fill up quickly.  She stated understanding and stated that she is talking with the family and she would call me back within the hour with a bed choice   Expected Discharge Plan: [place] Barriers to Discharge: Continued Medical Work up  Expected Discharge Plan and Services Expected Discharge Plan: [place] In-house Referral: Clinical Social Work Discharge Planning Services: CM Consult   Living arrangements for the past 2 months: [place]                                       Social Determinants of Health (SDOH) Interventions    Readmission Risk Interventions No flowsheet data found.

## 2018-08-29 NOTE — Progress Notes (Signed)
K 3.0 This am. MD notified.

## 2018-08-30 DIAGNOSIS — E1122 Type 2 diabetes mellitus with diabetic chronic kidney disease: Secondary | ICD-10-CM

## 2018-08-30 DIAGNOSIS — E11621 Type 2 diabetes mellitus with foot ulcer: Secondary | ICD-10-CM

## 2018-08-30 DIAGNOSIS — I129 Hypertensive chronic kidney disease with stage 1 through stage 4 chronic kidney disease, or unspecified chronic kidney disease: Secondary | ICD-10-CM

## 2018-08-30 DIAGNOSIS — L89102 Pressure ulcer of unspecified part of back, stage 2: Secondary | ICD-10-CM

## 2018-08-30 DIAGNOSIS — N182 Chronic kidney disease, stage 2 (mild): Secondary | ICD-10-CM

## 2018-08-30 DIAGNOSIS — I2510 Atherosclerotic heart disease of native coronary artery without angina pectoris: Secondary | ICD-10-CM

## 2018-08-30 DIAGNOSIS — Z7401 Bed confinement status: Secondary | ICD-10-CM

## 2018-08-30 DIAGNOSIS — Z881 Allergy status to other antibiotic agents status: Secondary | ICD-10-CM

## 2018-08-30 DIAGNOSIS — Z888 Allergy status to other drugs, medicaments and biological substances status: Secondary | ICD-10-CM

## 2018-08-30 DIAGNOSIS — L97419 Non-pressure chronic ulcer of right heel and midfoot with unspecified severity: Secondary | ICD-10-CM

## 2018-08-30 DIAGNOSIS — Z955 Presence of coronary angioplasty implant and graft: Secondary | ICD-10-CM

## 2018-08-30 DIAGNOSIS — I96 Gangrene, not elsewhere classified: Secondary | ICD-10-CM

## 2018-08-30 DIAGNOSIS — E1152 Type 2 diabetes mellitus with diabetic peripheral angiopathy with gangrene: Secondary | ICD-10-CM

## 2018-08-30 DIAGNOSIS — Z8673 Personal history of transient ischemic attack (TIA), and cerebral infarction without residual deficits: Secondary | ICD-10-CM

## 2018-08-30 DIAGNOSIS — E1169 Type 2 diabetes mellitus with other specified complication: Secondary | ICD-10-CM

## 2018-08-30 LAB — GLUCOSE, CAPILLARY
Glucose-Capillary: 202 mg/dL — ABNORMAL HIGH (ref 70–99)
Glucose-Capillary: 230 mg/dL — ABNORMAL HIGH (ref 70–99)
Glucose-Capillary: 189 mg/dL — ABNORMAL HIGH (ref 70–99)
Glucose-Capillary: 179 mg/dL — ABNORMAL HIGH (ref 70–99)
Glucose-Capillary: 149 mg/dL — ABNORMAL HIGH (ref 70–99)

## 2018-08-30 LAB — POTASSIUM: Potassium: 3.2 mmol/L — ABNORMAL LOW (ref 3.5–5.1)

## 2018-08-30 MED ORDER — RIVAROXABAN 15 MG PO TABS
15.0000 mg | ORAL_TABLET | Freq: Every evening | ORAL | Status: DC
  Administered 2018-08-30 – 2018-09-01 (×3): 15 mg via ORAL
  Filled 2018-08-30 (×4): qty 1

## 2018-08-30 MED ORDER — AMOXICILLIN-POT CLAVULANATE 875-125 MG PO TABS
1.0000 | ORAL_TABLET | Freq: Two times a day (BID) | ORAL | Status: DC
  Administered 2018-08-30 – 2018-09-02 (×6): 1 via ORAL
  Filled 2018-08-30 (×6): qty 1

## 2018-08-30 MED ORDER — POTASSIUM CHLORIDE CRYS ER 20 MEQ PO TBCR
40.0000 meq | EXTENDED_RELEASE_TABLET | Freq: Once | ORAL | Status: AC
  Administered 2018-08-30: 40 meq via ORAL
  Filled 2018-08-30: qty 2

## 2018-08-30 MED ORDER — RISAQUAD PO CAPS
1.0000 | ORAL_CAPSULE | Freq: Every day | ORAL | Status: DC
  Administered 2018-08-30 – 2018-09-02 (×4): 1 via ORAL
  Filled 2018-08-30 (×4): qty 1

## 2018-08-30 NOTE — Consult Note (Signed)
Pharmacy Antibiotic Note  Abigail Boyd is a 85 y.o. female admitted on 08/20/2018 with Osteomyelitis.  Pharmacy has been consulted for Vancomycin and Cefepime dosing.   Plan: Day 11- Cefepime 2 g IV q12h   Day 11- Vancomycin 750 mg IV Q 24 hrs. Goal AUC 400-550. Expected AUC: 406 SCr used: 0.83 Cmin 10.6 Will need to continue to monitor renal function. Scr ordered for AM.    6/30- Vanc Peak= 31/Trough=16. Per Vancomycin calculator, calculated AUC= 555.  Will continue current regimen of 750mg  IV q24h for now. Discussed with ID Pharmacist. Monitor Scr.  Family deciding amputation vs palliative care    Height: 5\' 4"  (162.6 cm) Weight: 143 lb 15.4 oz (65.3 kg) IBW/kg (Calculated) : 54.7  Temp (24hrs), Avg:98.1 F (36.7 C), Min:98 F (36.7 C), Max:98.4 F (36.9 C)  Recent Labs  Lab 08/24/18 0659 08/26/18 1634 08/27/18 0259 08/27/18 1338 08/29/18 0515  WBC  --   --   --  13.5* 11.9*  CREATININE 0.75  --  0.83  --  0.89  VANCOTROUGH  --   --   --  16  --   VANCOPEAK  --  31  --   --   --     Estimated Creatinine Clearance: 39.2 mL/min (by C-G formula based on SCr of 0.89 mg/dL).    Allergies  Allergen Reactions  . Amlodipine Cough  . Clopidogrel Bisulfate Cough  . Kenalog [Triamcinolone Acetonide] Other (See Comments)    unknown  . Lisinopril Cough  . Losartan Cough  . Sulfa Antibiotics Hives and Rash    Antimicrobials this admission: 6/23 Cefepime >> 6/23 Vancomycin >>   Dose adjustments this admission: 6/30:  Vanc AUC 555- continue current dose of 750mg  IV q24h  Microbiology results: 6/23 BCx: NG Wound Cx pending   Thank you for allowing pharmacy to be a part of this patient's care.  Chinita Greenland PharmD Clinical Pharmacist 08/30/2018

## 2018-08-30 NOTE — Progress Notes (Signed)
ANTICOAGULATION CONSULT NOTE - Initial Consult  Pharmacy Consult for Xarelto Indication: atrial fibrillation  Allergies  Allergen Reactions  . Amlodipine Cough  . Clopidogrel Bisulfate Cough  . Kenalog [Triamcinolone Acetonide] Other (See Comments)    unknown  . Lisinopril Cough  . Losartan Cough  . Sulfa Antibiotics Hives and Rash    Patient Measurements: Height: 5\' 4"  (162.6 cm) Weight: 143 lb 15.4 oz (65.3 kg) IBW/kg (Calculated) : 54.7 Heparin Dosing Weight:    Vital Signs: Temp: 98 F (36.7 C) (07/03 0759) BP: 172/72 (07/03 0759) Pulse Rate: 76 (07/03 0759)  Labs: Recent Labs    08/29/18 0515  HGB 9.7*  HCT 30.7*  PLT 434*  CREATININE 0.89    Estimated Creatinine Clearance: 39.2 mL/min (by C-G formula based on SCr of 0.89 mg/dL).   Medical History: Past Medical History:  Diagnosis Date  . Abnormal nuclear stress test 06/21/2011   Inferolateral reversible defect;--> cardiac cath & PCI of CxOM, occluded RCA with collaterals;; followup Myoview 11/2012: Low risk. Fixed basal inferior artifact normal EF. No ischemia  . Anxiety   . Asthma   . Bilateral cataracts    Status post stroke or correction  . CAD S/P percutaneous coronary angioplasty 06/2011   s/P PCI to proximal OM1 w/ DES; occluded RCA with bridging and L-R collaterals (medical management)  . Chronic kidney disease (CKD), stage II (mild)    Related to current bladder infections (although diabetes cannot be excluded)  . Chronotropic incompetence with sinus node dysfunction [facility]) October 2013   On CPET test; beta blockers reduced  . Diabetes mellitus type 2, controlled ([place])    On oral medications  . Diverticulitis   . Essential hypertension    Allowing for permissive hypertension to avoid orthostatic hypotension  . GERD (gastroesophageal reflux disease)    On PPI  . Glaucoma   . History of syncope    Per EP - neurocardiogenic & not Bradycardia related (no PPM)  . History of unstable angina  06/13/2011   Jaw pain awakening from sleep -- Myoview --CATH --> PCI  . Hyperlipidemia with target LDL less than 70    HDL at goal, LDL not at goal, borderline triglycerides. On Crestor 20 mg  . Migraines   . OSA (obstructive sleep apnea)    hx bladder infections  . Osteoarthritis   . PAF (paroxysmal atrial fibrillation) ([place]) 10-11/ 2014   CardioNet Event Monitor: NSR & S Brady -- Rates 50-100; Total A. fib burden 35 hours and 27 minutes. 1296 episodes, longest was 1 hour 29 minutes. Rate ranged from 52-169 beats per minute.  . Seasonal allergies   . Shortness of breath on exertion October 2013   2-D echo: Normal EF>55%, Gr 1DD, mild aortic sclerosis; Evaluated with CPET - peak VO2 97%; Chronotropic Incompetence (submaximal effort)  . Spinal stenosis of lumbar region 11/2011  . Stroke ([place]) 06/2017  . Stroke ([place]) 11/2014  . Tachycardia-bradycardia syndrome ([place]) 11/2012  . Urge incontinence      Assessment: 85 yo F from SNF with Diabetic heel ulcer, osteomyelitis. Patient on Xarelto 15 mg every evening PTA for Afib. Xarelto was changed to Lovenox 1 mg/kg q12h in anticipation of surgery. Patient is not having surgery, now Resuming Xarelto Crcl = 46.7 ml/min (with TBW of 65.3kg and Scr 0.89)  Goal of Therapy:  Monitor platelets by anticoagulation protocol: Yes   Plan:  Will resume Xarelto 15 mg po QPM for Afib with Crcl 46.7 ml/min using Total body weight. Discontinue  therapeutic Lovenox Last dose of Lovenox at 1216. Can start Xarelto 0-2 hours prior to the time that the next Lovenox would be due.   [name] A 08/30/2018,3:12 PM

## 2018-08-30 NOTE — Progress Notes (Addendum)
Palliative: Mrs. Blass is sitting up in bed.  She is alert but continues to appear weak and frail, but able to make her basic needs known.   There is no family at bedside at this time due to visitor restrictions.  Call to daughter [name] at [phone].  Left VM message regarding continued use of antibiotics and need for PICC line.    Conference with RN CM related to patient condition, needs, disposition. PMT has been unable to contact daughter [name].    At this point, daughter [name] has not shared with medical team that she would like to stop antibiotics and let nature take its course.  It seems that the goal is to continue antibiotics.  Plan:  DC to rehab (choice of facility yet to be determined), at this point we must assume that we will continue IV antibiotics until told to stop, Palliative care to follow.   ADDENDUM:  Call from daughter [name].  [name] asks multiple questions about IV antibiotic.  She at this point is unable to make a choice, about returning to a facility with IV antibiotics.  We talked about antibiotics not changing what is happening, is not curative.  I asked [name] to consider how she will care for her mother when she becomes sick again.  Will she bring her back to the hospital, or transition to residential hospice.  [name] tells me that she wants her mother "as comfortable as possible".  We talked about IV antibiotics as aggressive therapy, my concern that nursing home staff may not understand her desire to also keep Mrs. Breth comfortable.  I share that may be difficult for the nursing home staff to to understand. [name] asks to speak to attending doctor who was notified of her request.   15 minutes Quinn Axe, NP Palliative Medicine Team Team Phone # [phone] Greater than 50% of this time was spent counseling and coordinating care related to the above assessment and plan.

## 2018-08-30 NOTE — Progress Notes (Signed)
[place] [facility] [place] NAME: Abigail Boyd    MR#:  [id number]  DATE OF BIRTH:  17-Jun-1929  SUBJECTIVE:   Patient here due to a right heel/foot ulcer/infection and noted to have osteomyelitis.    Pt. Remains non-verbal. REVIEW OF SYSTEMS:    Review of Systems  Unable to perform ROS: Mental acuity    Nutrition: Soft diet Tolerating Diet: Yes Tolerating PT: bedbound  DRUG ALLERGIES:   Allergies  Allergen Reactions  . Amlodipine Cough  . Clopidogrel Bisulfate Cough  . Kenalog [Triamcinolone Acetonide] Other (See Comments)    unknown  . Lisinopril Cough  . Losartan Cough  . Sulfa Antibiotics Hives and Rash    VITALS:  Blood pressure (!) 172/72, pulse 76, temperature 98 F (36.7 C), resp. rate 16, height 5\' 4"  (1.626 m), weight 65.3 kg, SpO2 100 %.  PHYSICAL EXAMINATION:   Physical Exam  GENERAL:  85 y.o.-year-old patient lying in bed non-verbal but in NAD.  EYES: Pupils equal, round, reactive to light and accommodation. No scleral icterus. Extraocular muscles intact.  HEENT: Head atraumatic, normocephalic. Oropharynx and nasopharynx clear.  NECK:  Supple, no jugular venous distention. No thyroid enlargement, no tenderness.  LUNGS: Normal breath sounds bilaterally, no wheezing, rales, rhonchi. No use of accessory muscles of respiration.  CARDIOVASCULAR: S1, S2 normal. No murmurs, rubs, or gallops.  ABDOMEN: Soft, nontender, nondistended. Bowel sounds present. No organomegaly or mass.  EXTREMITIES: No cyanosis, clubbing or edema b/l. Right heel ulcer with dressing in place    NEUROLOGIC: Cranial nerves II through XII are intact. No focal Motor or sensory deficits b/l. Globally weak.    PSYCHIATRIC: The patient is alert and oriented x 1.   SKIN: No obvious rash, lesion, right hell ulcer with   large black eschar on the posterior right heel is approximately 6 cm x 7 cm there is some mild erythema along the periphery          LABORATORY PANEL:   CBC Recent Labs  Lab 08/29/18 0515  WBC 11.9*  HGB 9.7*  HCT 30.7*  PLT 434*   ------------------------------------------------------------------------------------------------------------------  Chemistries  Recent Labs  Lab 08/29/18 0515 08/30/18 0428  NA 137  --   K 3.0* 3.2*  CL 106  --   CO2 21*  --   GLUCOSE 191*  --   BUN 12  --   CREATININE 0.89  --   CALCIUM 8.5*  --   MG 2.0  --    ------------------------------------------------------------------------------------------------------------------  Cardiac Enzymes No results for input(s): TROPONINI in the last 168 hours. ------------------------------------------------------------------------------------------------------------------  RADIOLOGY:  No results found.   ASSESSMENT AND PLAN:   85 year old female with past medical history of diabetes with CKD stage III, paroxysmal atrial fibrillation, coronary artery disease, essential hypertension, history of previous CVA who presented to the hospital due to a foul-smelling right heel ulcer.  1.  Diabetic ulcer on the right heel- suspected to be secondary to underlying osteomyelitis.  X-ray was negative but MRI confirmed osteomyelitis. -Seen by both podiatry and vascular surgery. -Patient underwent angiogram with angioplasty to the right lower extremity to improve blood flow. -Vascular surgery discussed extensively with patient's daughter about surgical options and she does not want any aggressive care at this time and wants to pursue more comfort measures. Per palliative care consult, Daughter [name] states her goal is residential hospice, but shares that she is unsure if she could accept no further antibiotics.  At this point  it is questionable if Mrs. Strom would qualify for residential hospice.  Continue cefepime and vancomycin for now. Daughter has not decided whether continue antibiotics or not for case manager. ID consult for antibiotics  recommendation.  2.  Diabetes type 2 with CKD stage III- continue sliding scale insulin, follow blood sugars.  3.  Paroxysmal atrial fibrillation- rate controlled. -Continue metoprolol.  Resume Xarelto.  4.  Neuropathy secondary to diabetes- continue gabapentin.  5.  Depression- continue Prozac.  6.  Restless leg syndrome- continue Requip.  7.  Glaucoma- continue timolol eyedrops.  8.  GERD- continue Protonix.  9.  History of urinary incontinence- continue oxybutynin.  Hypokalemia.  Potassium supplement.  I called the patient's daughter, but nobody answered the phone.. All the records are reviewed and case discussed with Care Management/Social Worker. Management plans discussed with the patient, family and they are in agreement.  CODE STATUS: DNR  DVT Prophylaxis: Lovenox  TOTAL TIME TAKING CARE OF THIS PATIENT: 28 minutes.   POSSIBLE D/C IN 1-2 DAYS, DEPENDING ON CLINICAL CONDITION.   Demetrios Loll M.D on 08/30/2018 at 2:46 PM  Between 7am to 6pm - Pager - [phone]  After 6pm go to [email or web address] - [employer]  [facility]ospital[facility]  Office  [phone]  CC: Primary care physician; Kirk Ruths, MD

## 2018-08-30 NOTE — TOC Progression Note (Signed)
Transition of Care [facility]) - Progression Note    Patient Details  Name: Abigail Boyd MRN: [id number] Date of Birth: [birth year]  Transition of Care [facility]) CM/SW [place], RN Phone Number: 08/30/2018, 11:19 AM  Clinical Narrative:    Attempted to reach the daughter [name] at [phone] cell, [phone] and wk [phone], no answer, left a voice mail for a call back.  Need to notify of [name] having a positive covid also needed to find out if she wishes to continue ABX and if so the patient will have a PICC line before DC   Expected Discharge Plan: [place] Barriers to Discharge: Continued Medical Work up  Expected Discharge Plan and Services Expected Discharge Plan: [place] In-house Referral: Clinical Social Work Discharge Planning Services: CM Consult   Living arrangements for the past 2 months: [place]                                       Social Determinants of Health (SDOH) Interventions    Readmission Risk Interventions No flowsheet data found.

## 2018-08-30 NOTE — TOC Progression Note (Signed)
Transition of Care [facility]) - Progression Note    Patient Details  Name: Abigail Boyd MRN: [id number] Date of Birth: January 17, 1930  Transition of Care [facility]) CM/SW Contact  Su Hilt, RN Phone Number: 08/30/2018, 1:43 PM  Clinical Narrative:     Spoke with the daughter [name] and explained that [employer] has had one case of Covid,  I also asked if she wanted to continue with IV ABX and explained if she did it would mean that the patient would have to have a PICC line and that she would need do give consent,  She asked if the patient would still be treated for pain.  I explained that yes they would still treat her for pain if she was in pain.  She is going to call the other family members to discuss and let me know Expected Discharge Plan: Skilled Nursing Facility Barriers to Discharge: Continued Medical Work up  Expected Discharge Plan and Services Expected Discharge Plan: [place] In-house Referral: Clinical Social Work Discharge Planning Services: CM Consult   Living arrangements for the past 2 months: [place]                                       Social Determinants of Health (SDOH) Interventions    Readmission Risk Interventions No flowsheet data found.

## 2018-08-30 NOTE — TOC Progression Note (Signed)
Transition of Care [facility]) - Progression Note    Patient Details  Name: Abigail Boyd MRN: [id number] Date of Birth: 1929/03/27  [facility]) CM/SW Contact  Su Hilt, RN Phone Number: 08/30/2018, 8:46 AM  Clinical Narrative:    Called to speak to [name] to notify that [employer] has had one case of Covid positive in an employee, Was unable to reach her, left a VM requesting a call back   Expected Discharge Plan: [place] Barriers to Discharge: Continued Medical Work up  Expected Discharge Plan and Services Expected Discharge Plan: [place] In-house Referral: Clinical Social Work Discharge Planning Services: CM Consult   Living arrangements for the past 2 months: [place]                                       Social Determinants of Health (SDOH) Interventions    Readmission Risk Interventions No flowsheet data found.

## 2018-08-30 NOTE — Consult Note (Signed)
NAME: Abigail Boyd  DOB: 08-23-1929  MRN: [id number]  Date/Time: 08/30/2018 3:26 PM  REQUESTING PROVIDER:chen Subjective:  REASON FOR CONSULT: pressure ulcer heel-antibiotic recommendation ?chart reviewed, spoke to daughter No history from patient Abigail Boyd is a 85 y.o. female with a history of CAD status post stent, CKD stage II, diabetes mellitus, hypertension, stroke has been in a nursing facility for more than a year due to progressive weakness.  She is bedbound at baseline.  As per the daughter because of recent cold exposure she was quarantined and did not get much activity and around  mid-May she developed a blister on her right heel which got worse with development of gangrene and eschar.  She was sent to the wound clinic on 08/20/2018 and was seen by the PA and asked to get admitted to the hospital. She had an x-ray in the ED which showed osteomyelitis and hence had MRI which also confirmed calcaneal osteomyelitis.   Patient was started on vancomycin and cefepime for leukocytosis. She was seen by Dr. Elvina Mattes the podiatrist who did not think she was a candidate for local surgical intervention and recommended BKA.  She was seen by vascular surgeon and on 08/23/2018 underwent angiogram and percutaneous transluminal angioplasty of the right superficial femoral and popliteal arteries.  And also the right peroneal artery.  BKA was not a option in a bedbound patient as the risk for contraction and experiencing stump breakdown was high.  AKA was recommended but family did not want any aggressive surgical intervention.  Palliative care has seen the patient and there was a discussion of of comfort care but the daughter has not moved in the direction yet.  I am asked to see the patient for antibiotic recommendation. Past Medical History:  Diagnosis Date   Abnormal nuclear stress test 06/21/2011   Inferolateral reversible defect;--> cardiac cath & PCI of CxOM, occluded RCA with collaterals;; followup  Myoview 11/2012: Low risk. Fixed basal inferior artifact normal EF. No ischemia   Anxiety    Asthma    Bilateral cataracts    Status post stroke or correction   CAD S/P percutaneous coronary angioplasty 06/2011   s/P PCI to proximal OM1 w/ DES; occluded RCA with bridging and L-R collaterals (medical management)   Chronic kidney disease (CKD), stage II (mild)    Related to current bladder infections (although diabetes cannot be excluded)   Chronotropic incompetence with sinus node dysfunction [facility]) October 2013   On CPET test; beta blockers reduced   Diabetes mellitus type 2, controlled ([place])    On oral medications   Diverticulitis    Essential hypertension    Allowing for permissive hypertension to avoid orthostatic hypotension   GERD (gastroesophageal reflux disease)    On PPI   Glaucoma    History of syncope    Per EP - neurocardiogenic & not Bradycardia related (no PPM)   History of unstable angina 06/13/2011   Jaw pain awakening from sleep -- Myoview --CATH --> PCI   Hyperlipidemia with target LDL less than 70    HDL at goal, LDL not at goal, borderline triglycerides. On Crestor 20 mg   Migraines    OSA (obstructive sleep apnea)    hx bladder infections   Osteoarthritis    PAF (paroxysmal atrial fibrillation) ([place]) 10-11/ 2014   CardioNet Event Monitor: NSR & S Brady -- Rates 50-100; Total A. fib burden 35 hours and 27 minutes. 1296 episodes, longest was 1 hour 29 minutes. Rate ranged from 52-169  beats per minute.   Seasonal allergies    Shortness of breath on exertion October 2013   2-D echo: Normal EF>55%, Gr 1DD, mild aortic sclerosis; Evaluated with CPET - peak VO2 97%; Chronotropic Incompetence (submaximal effort)   Spinal stenosis of lumbar region 11/2011   Stroke ([place]) 06/2017   Stroke ([place]) 11/2014   Tachycardia-bradycardia syndrome ([place]) 11/2012   Urge incontinence     Past Surgical History:  Procedure Laterality Date   APPENDECTOMY       BREAST BIOPSY     both breast   cataracts     both eyes   COLONOSCOPY     CORONARY ANGIOPLASTY WITH STENT PLACEMENT  07/05/2011   Promus Element DES 2.5 mm x 16 mm-post dilated to 2.65 mm CX-Proximal OM1   Holter Monitor  04/2015   Sinus rhythm with rates 52-149 BPM. Isolated PACs with rare couplets and bigeminy. Multiple short runs of PAT/PSVT. Arrhythmia run 120 bpm for 204 beats. 14% of time was in A. fib/flutter. - Reviewed by Dr. Caryl Comes. Not thought to be significant enough for her syncope.   KNEE ARTHROSCOPY WITH MEDIAL MENISECTOMY Right 06/24/2014   Procedure: RIGHT KNEE ARTHROSCOPY WITH partial lateral MENISECTOMY, abrasion chondroplasty of medial femoral condyle and patella, microfracture technique;  Surgeon: Latanya Maudlin, MD;  Location: [facility];  Service: Orthopedics;  Laterality: Right;   LEFT HEART CATHETERIZATION WITH CORONARY ANGIOGRAM N/A 07/06/2011   Procedure: LEFT HEART CATHETERIZATION WITH CORONARY ANGIOGRAM;  Surgeon: Leonie Man, MD;  Location: [facility] CATH LAB: Unstable Angina --> Myoview wiht Inf-Lat Ischemia.  Proximal OM1 lesion --> PCI; 100% mid RCA with right to right and left to right bridging collaterals from circumflex RPL and LAD the PDA.   LOWER EXTREMITY ANGIOGRAPHY Right 08/23/2018   Procedure: Lower Extremity Angiography;  Surgeon: Katha Cabal, MD;  Location: [place] CV LAB;  Service: Cardiovascular;  Laterality: Right;   NM MYOVIEW LTD  October 2014    Low risk. Fixed basal inferior artifact normal EF. No ischemia --> (as compared to pre-PCI Myoview revealing inferolateral ischemia)   rotator cuff Right    TRANSTHORACIC ECHOCARDIOGRAM  11/2014   EF 65-70%, Mod LVH. Normal wall motion. Gr 1 DD. Normal valves.   VAGINAL HYSTERECTOMY      Social History   Socioeconomic History   Marital status: Widowed    Spouse name: Not on file   Number of children: Not on file   Years of education: Not on file   Highest education level: Not on  file  Occupational History   Occupation: retired  Scientist, product/process development strain: Not hard at [employer] insecurity    Worry: Patient refused    Inability: Patient refused   Transportation needs    Medical: Patient refused    Non-medical: Patient refused  Tobacco Use   Smoking status: Never Smoker   Smokeless tobacco: Never Used  Substance and Sexual Activity   Alcohol use: No   Drug use: No   Sexual activity: Never    Birth control/protection: Post-menopausal  Lifestyle   Physical activity    Days per week: Patient refused    Minutes per session: Patient refused   Stress: Only a little  Relationships   Social connections    Talks on phone: Patient refused    Gets together: Patient refused    Attends religious service: Patient refused    Active member of club or organization: Patient refused    Attends meetings  of clubs or organizations: Patient refused    Relationship status: Patient refused   Intimate partner violence    Fear of current or ex partner: Patient refused    Emotionally abused: Patient refused    Physically abused: Patient refused    Forced sexual activity: Patient refused  Other Topics Concern   Not on file  Social History Narrative   Widowed mother of one, grandmother 2. Has not been using silver take as much lately due to knee discomfort. Usually exercises with Silver Sneakers on regular basis.    Family History  Problem Relation Age of Onset   COPD Mother    Pulmonary fibrosis Mother    Heart attack Father    Heart disease Father    Stroke Father    Cancer Sister    Cancer Sister    Clotting disorder Sister    Heart attack Sister    Arthritis Other    Breast cancer Sister    Colon cancer Neg Hx    Esophageal cancer Neg Hx    Stomach cancer Neg Hx    Allergies  Allergen Reactions   Amlodipine Cough   Clopidogrel Bisulfate Cough   Kenalog [Triamcinolone Acetonide] Other (See Comments)    unknown     Lisinopril Cough   Losartan Cough   Sulfa Antibiotics Hives and Rash     ? Current Facility-Administered Medications  Medication Dose Route Frequency Provider Last Rate Last Dose   0.9 %  sodium chloride infusion  250 mL Intravenous PRN Schnier, Dolores Lory, MD       ceFEPIme (MAXIPIME) 2 g in sodium chloride 0.9 % 100 mL IVPB  2 g Intravenous Q12H Schnier, Dolores Lory, MD 200 mL/hr at 08/30/18 1346 2 g at 08/30/18 1346   docusate sodium (COLACE) capsule 100 mg  100 mg Oral Daily Schnier, Dolores Lory, MD   100 mg at 08/29/18 1005   docusate sodium (COLACE) capsule 100 mg  100 mg Oral BID PRN Schnier, Dolores Lory, MD       feeding supplement (ENSURE ENLIVE) (ENSURE ENLIVE) liquid 237 mL  237 mL Oral BID BM Schnier, Dolores Lory, MD   237 mL at 08/28/18 1448   FLUoxetine (PROZAC) capsule 40 mg  40 mg Oral Daily Schnier, Dolores Lory, MD   40 mg at 08/29/18 1008   furosemide (LASIX) tablet 20 mg  20 mg Oral Daily Schnier, Dolores Lory, MD   20 mg at 08/29/18 1006   gabapentin (NEURONTIN) capsule 300 mg  300 mg Oral TID Katha Cabal, MD   300 mg at 08/29/18 1006   hydrALAZINE (APRESOLINE) injection 5 mg  5 mg Intravenous Q20 Min PRN Schnier, Dolores Lory, MD       insulin aspart (novoLOG) injection 0-5 Units  0-5 Units Subcutaneous QHS Schnier, Dolores Lory, MD       insulin aspart (novoLOG) injection 0-9 Units  0-9 Units Subcutaneous TID WC Schnier, Dolores Lory, MD   2 Units at 08/30/18 1216   labetalol (NORMODYNE) injection 10 mg  10 mg Intravenous Q10 min PRN Schnier, Dolores Lory, MD   10 mg at 08/29/18 2341   latanoprost (XALATAN) 0.005 % ophthalmic solution 1 drop  1 drop Both Eyes QHS Schnier, Dolores Lory, MD   1 drop at 08/28/18 2117   levETIRAcetam (KEPPRA) tablet 500 mg  500 mg Oral BID Katha Cabal, MD   500 mg at 08/29/18 1008   metoprolol tartrate (LOPRESSOR) tablet 12.5 mg  12.5 mg Oral  Daily Schnier, Dolores Lory, MD   12.5 mg at 08/29/18 1005   morphine 2 MG/ML injection 2 mg  2  mg Intravenous Q1H PRN Delana Meyer Dolores Lory, MD   2 mg at 08/29/18 2349   multivitamin with minerals tablet 1 tablet  1 tablet Oral Daily Schnier, Dolores Lory, MD   1 tablet at 08/29/18 1006   nitroGLYCERIN (NITROSTAT) SL tablet 0.4 mg  0.4 mg Sublingual Q5 min PRN Schnier, Dolores Lory, MD       ondansetron [facility]) injection 4 mg  4 mg Intravenous Q6H PRN Schnier, Dolores Lory, MD   4 mg at 08/22/18 0453   oxybutynin (DITROPAN) tablet 5 mg  5 mg Oral BID Schnier, Dolores Lory, MD   5 mg at 08/29/18 1009   oxyCODONE-acetaminophen (PERCOCET/ROXICET) 5-325 MG per tablet 1 tablet  1 tablet Oral Q6H PRN Katha Cabal, MD   1 tablet at 08/27/18 2224   pantoprazole (PROTONIX) EC tablet 40 mg  40 mg Oral Daily Schnier, Dolores Lory, MD   40 mg at 08/29/18 1005   polyethylene glycol (MIRALAX / GLYCOLAX) packet 17 g  17 g Oral Daily Katha Cabal, MD   17 g at 08/28/18 0998   Rivaroxaban (XARELTO) tablet 15 mg  15 mg Oral QPM Demetrios Loll, MD       rOPINIRole (REQUIP) tablet 0.5 mg  0.5 mg Oral QHS Schnier, Dolores Lory, MD   0.5 mg at 08/27/18 2147   senna (SENOKOT) tablet 17.2 mg  2 tablet Oral QHS Schnier, Dolores Lory, MD   17.2 mg at 08/27/18 2146   sodium chloride flush (NS) 0.9 % injection 3 mL  3 mL Intravenous Q12H Schnier, Dolores Lory, MD   3 mL at 08/30/18 3382   sodium chloride flush (NS) 0.9 % injection 3 mL  3 mL Intravenous PRN Katha Cabal, MD   3 mL at 08/27/18 2158   timolol (TIMOPTIC) 0.5 % ophthalmic solution 1 drop  1 drop Both Eyes QHS Schnier, Dolores Lory, MD   1 drop at 08/29/18 2127   vancomycin (VANCOCIN) IVPB 750 mg/150 ml premix  750 mg Intravenous Q24H Katha Cabal, MD 150 mL/hr at 08/30/18 1437 750 mg at 08/30/18 1437   vitamin C (ASCORBIC ACID) tablet 250 mg  250 mg Oral BID Schnier, Dolores Lory, MD   250 mg at 08/30/18 5053     Abtx:  Anti-infectives (From admission, onward)   Start     Dose/Rate Route Frequency Ordered Stop   08/23/18 0500  ceFAZolin (ANCEF) IVPB  2g/100 mL premix    Note to Pharmacy: Send with patient to specials   2 g 200 mL/hr over 30 Minutes Intravenous On call 08/22/18 1050 08/23/18 0903   08/21/18 1400  ceFEPIme (MAXIPIME) 1 g in sodium chloride 0.9 % 100 mL IVPB  Status:  Discontinued     1 g 200 mL/hr over 30 Minutes Intravenous Every 24 hours 08/20/18 1421 08/21/18 1212   08/21/18 1400  vancomycin (VANCOCIN) IVPB 750 mg/150 ml premix     750 mg 150 mL/hr over 60 Minutes Intravenous Every 24 hours 08/21/18 1212     08/21/18 1300  ceFEPIme (MAXIPIME) 2 g in sodium chloride 0.9 % 100 mL IVPB     2 g 200 mL/hr over 30 Minutes Intravenous Every 12 hours 08/21/18 1212     08/20/18 1417  vancomycin variable dose per unstable renal function (pharmacist dosing)  Status:  Discontinued      Does  not apply See admin instructions 08/20/18 1419 08/21/18 1212   08/20/18 1330  vancomycin (VANCOCIN) 1,500 mg in sodium chloride 0.9 % 500 mL IVPB     1,500 mg 250 mL/hr over 120 Minutes Intravenous  Once 08/20/18 1303 08/20/18 1647   08/20/18 1315  ceFEPIme (MAXIPIME) 2 g in sodium chloride 0.9 % 100 mL IVPB     2 g 200 mL/hr over 30 Minutes Intravenous  Once 08/20/18 1308 08/20/18 1454      REVIEW OF SYSTEMS:  NA Objective:  VITALS:  BP (!) 172/72 (BP Location: Left Arm)    Pulse 76    Temp 98 F (36.7 C)    Resp 16    Ht 5\' 4"  (1.626 m)    Wt 65.3 kg    SpO2 100%    BMI 24.71 kg/m  PHYSICAL EXAM:  General: Awake but nonverbal.  Trying to respond to my questions. Examination limited as patient is lying on her right side. Head: Normocephalic, without obvious abnormality, atraumatic. Eyes: Conjunctivae clear, anicteric sclerae. Pupils are equal EAR nose and throat not examined Back: Stage II has some necrotic tissue    Lungs: Bilateral air entry Heart: S1-S2 Abdomen: Soft,  Extremities: Right heel has a 7 cm necrotic eschar  no erythema around    Skin: No rashes or lesions. Or bruising  Neurologic: Did not examine in  detail Pertinent Labs Lab Results CBC    Component Value Date/Time   WBC 11.9 (H) 08/29/2018 0515   RBC 3.23 (L) 08/29/2018 0515   HGB 9.7 (L) 08/29/2018 0515   HGB 11.0 (L) 01/27/2015 1609   HCT 30.7 (L) 08/29/2018 0515   HCT 36.2 07/22/2017 0026   PLT 434 (H) 08/29/2018 0515   PLT 262 01/27/2015 1609   MCV 95.0 08/29/2018 0515   MCV 93 01/27/2015 1609   MCH 30.0 08/29/2018 0515   MCHC 31.6 08/29/2018 0515   RDW 12.9 08/29/2018 0515   RDW 13.8 01/27/2015 1609   LYMPHSABS 1.9 08/20/2018 1307   MONOABS 1.3 (H) 08/20/2018 1307   EOSABS 0.1 08/20/2018 1307   BASOSABS 0.1 08/20/2018 1307    CMP Latest Ref Rng & Units 08/30/2018 08/29/2018 08/27/2018  Glucose 70 - 99 mg/dL - 191(H) -  BUN 8 - 23 mg/dL - 12 -  Creatinine 0.44 - 1.00 mg/dL - 0.89 0.83  Sodium 135 - 145 mmol/L - 137 -  Potassium 3.5 - 5.1 mmol/L 3.2(L) 3.0(L) -  Chloride 98 - 111 mmol/L - 106 -  CO2 22 - 32 mmol/L - 21(L) -  Calcium 8.9 - 10.3 mg/dL - 8.5(L) -  Total Protein 6.5 - 8.1 g/dL - - -  Total Bilirubin 0.3 - 1.2 mg/dL - - -  Alkaline Phos 38 - 126 U/L - - -  AST 15 - 41 U/L - - -  ALT 0 - 44 U/L - - -      Microbiology: Recent Results (from the past 240 hour(s))  MRSA PCR Screening     Status: None   Collection Time: 08/20/18  9:16 PM   Specimen: Nasal Mucosa; Nasopharyngeal  Result Value Ref Range Status   MRSA by PCR NEGATIVE NEGATIVE Final    Comment:        The GeneXpert MRSA Assay (FDA approved for NASAL specimens only), is one component of a comprehensive MRSA colonization surveillance program. It is not intended to diagnose MRSA infection nor to guide or monitor treatment for MRSA infections. Performed at [facility],  [place], [place] [address]   SARS Coronavirus 2 (CEPHEID - Performed in [place] hospital lab), Hosp Order     Status: None   Collection Time: 08/29/18  5:26 PM   Specimen: Nasopharyngeal Swab  Result Value Ref Range Status   SARS  Coronavirus 2 NEGATIVE NEGATIVE Final    Comment: (NOTE) If result is NEGATIVE SARS-CoV-2 target nucleic acids are NOT DETECTED. The SARS-CoV-2 RNA is generally detectable in upper and lower  respiratory specimens during the acute phase of infection. The lowest  concentration of SARS-CoV-2 viral copies this assay can detect is 250  copies / mL. A negative result does not preclude SARS-CoV-2 infection  and should not be used as the sole basis for treatment or other  patient management decisions.  A negative result may occur with  improper specimen collection / handling, submission of specimen other  than nasopharyngeal swab, presence of viral mutation(s) within the  areas targeted by this assay, and inadequate number of viral copies  (<250 copies / mL). A negative result must be combined with clinical  observations, patient history, and epidemiological information. If result is POSITIVE SARS-CoV-2 target nucleic acids are DETECTED. The SARS-CoV-2 RNA is generally detectable in upper and lower  respiratory specimens dur ing the acute phase of infection.  Positive  results are indicative of active infection with SARS-CoV-2.  Clinical  correlation with patient history and other diagnostic information is  necessary to determine patient infection status.  Positive results do  not rule out bacterial infection or co-infection with other viruses. If result is PRESUMPTIVE POSTIVE SARS-CoV-2 nucleic acids MAY BE PRESENT.   A presumptive positive result was obtained on the submitted specimen  and confirmed on repeat testing.  While 2019 novel coronavirus  (SARS-CoV-2) nucleic acids may be present in the submitted sample  additional confirmatory testing may be necessary for epidemiological  and / or clinical management purposes  to differentiate between  SARS-CoV-2 and other Sarbecovirus currently known to infect humans.  If clinically indicated additional testing with an alternate test    methodology [phone]) is advised. The SARS-CoV-2 RNA is generally  detectable in upper and lower respiratory sp ecimens during the acute  phase of infection. The expected result is Negative. Fact Sheet for Patients:  StrictlyIdeas.no Fact Sheet for Healthcare Providers: BankingDealers.co.za This test is not yet approved or cleared by the [place] FDA and has been authorized for detection and/or diagnosis of SARS-CoV-2 by FDA under an Emergency Use Authorization (EUA).  This EUA will remain in effect (meaning this test can be used) for the duration of the COVID-19 declaration under Section 564(b)(1) of the Act, 21 U.S.C. section 360bbb-3(b)(1), unless the authorization is terminated or revoked sooner. Performed at [facility], [address] [place], [place] [address]     IMAGING RESULTS:  Large and deep heel ulcer extending right down to the bone with surrounding granulation tissue and cellulitis. 2. Myofasciitis involving the short flexor muscles but no definite findings for pyomyositis. 3. Extensive osteomyelitis involving the posterior third of the calcaneus.      I have personally reviewed the films ? Impression/Recommendation ?85 year old female who is immobile, bedbound, nonverbal with history of coronary artery disease, CVA ?Right heel necrotic eschar pressure ulcer with underlying osteomyelitis of the calcaneal bone as evidenced on MRI. This is situation with antibiotics alone is not going to be helpful.  She needs aggressive surgical debridement but that is not feasible as the wound will not heal  without a flap.  Her comorbidities and her underlying condition does not allow for a BKA and family not interested in aggressive AKA. She did have some peripheral arterial disease in the right been opened up with angios. But doubt this is going to heal without any intervention. Will not do IV antibiotics as it  is futile. Discussed with daughter [name] in great detail. we agreed to do p.o. Augmentin as long as she is able to take p.o. medications for at least another 3 to 4 weeks.  This will be given along with p.o. probiotic. Daughter is fully aware that antibiotic is not going to cure this condition and also she is at high risk for developing C. difficile colitis, other side effects including renal injury as well as multidrug-resistant organism.  Daughter is very concerned that mom can develop sepsis without any antibiotics.  She is also fully aware that if mother fails to take orally, antibiotic may not be given and then she would just seek pain management.  ? Daughter is still undecided about complete comfort or hospice care at this moment. Daughter conveyed that Badger which the patient is on should not be given to her as it suppresses her appetite and also makes her lethargic.  She wanted me to stop the Keppra.  I communicated that to Dr. Bridgett Larsson and discontinued Keppra  Sacral decubitus stage II-III.  Santyl may help to lift the necrotic tissue.  Primary team to reconsult wound care regarding the same.  Discontinued vancomycin and cefepime.  Start Augmentin 875 mg p.o. twice daily.  This will have to be adjusted according to creatinine clearance.  This can be given for another 3 weeks.  _  ID will sign off call if needed.    Note:  This document was prepared using Dragon voice recognition software and may include unintentional dictation errors.

## 2018-08-30 NOTE — Consult Note (Signed)
ANTICOAGULATION CONSULT NOTE - Follow Up Consult  Pharmacy Consult for Enoxaparin  Indication: atrial fibrillation  (on Xarelto PTA)  Allergies  Allergen Reactions  . Amlodipine Cough  . Clopidogrel Bisulfate Cough  . Kenalog [Triamcinolone Acetonide] Other (See Comments)    unknown  . Lisinopril Cough  . Losartan Cough  . Sulfa Antibiotics Hives and Rash    Patient Measurements: Height: 5\' 4"  (162.6 cm) Weight: 143 lb 15.4 oz (65.3 kg) IBW/kg (Calculated) : 54.7   Vital Signs: Temp: 98 F (36.7 C) (07/03 0759) BP: 172/72 (07/03 0759) Pulse Rate: 76 (07/03 0759)  Labs: Recent Labs    08/27/18 1338 08/29/18 0515  HGB 9.2* 9.7*  HCT 29.4* 30.7*  PLT 427* 434*  CREATININE  --  0.89    Estimated Creatinine Clearance: 39.2 mL/min (by C-G formula based on SCr of 0.89 mg/dL).   Medications:  Xarelto last given on 08/19/18 @ 1800  Assessment: Patient is a resident a [facility] who presents with a wound infection. Xarelto is being held for possible foot surgery. CrCl < 30 mL/min  6/26: SCr 0.9 CrCl 38.7 ml/min  TBW Crcl 46.2 ml/min 6/30  Scr 0.83 Crcl 42 ml/min 7/2 Scr 0.89  Crcl 39.2  Goal of Therapy:  Monitor platelets by anticoagulation protocol: Yes   Plan:   Enoxaparin 1 mg/kg SQ  q12h. (65 mg q12h) Will need SCr and CBC at least every three days per policy.   Pharmacy will continue to follow.   Catherine Cubero A 08/30/2018,9:59 AM

## 2018-08-31 LAB — CREATININE, SERUM
Creatinine, Ser: 0.76 mg/dL (ref 0.44–1.00)
GFR calc Af Amer: >60 mL/min (ref >60)
GFR calc non Af Amer: >60 mL/min (ref >60)

## 2018-08-31 LAB — POTASSIUM
Potassium: 2.6 mmol/L — CL (ref 3.5–5.1)
Potassium: 3.7 mmol/L (ref 3.5–5.1)

## 2018-08-31 LAB — GLUCOSE, CAPILLARY
Glucose-Capillary: 197 mg/dL — ABNORMAL HIGH (ref 70–99)
Glucose-Capillary: 248 mg/dL — ABNORMAL HIGH (ref 70–99)
Glucose-Capillary: 289 mg/dL — ABNORMAL HIGH (ref 70–99)
Glucose-Capillary: 280 mg/dL — ABNORMAL HIGH (ref 70–99)
Glucose-Capillary: 217 mg/dL — ABNORMAL HIGH (ref 70–99)

## 2018-08-31 LAB — MAGNESIUM: Magnesium: 1.8 mg/dL (ref 1.7–2.4)

## 2018-08-31 MED ORDER — POTASSIUM CHLORIDE 10 MEQ/100ML IV SOLN
10.0000 meq | INTRAVENOUS | Status: AC
  Administered 2018-08-31 (×4): 10 meq via INTRAVENOUS
  Filled 2018-08-31 (×2): qty 100

## 2018-08-31 MED ORDER — ENSURE ENLIVE PO LIQD: 237.0000 mL | Freq: Two times a day (BID) | ORAL | 12 refills | Status: AC

## 2018-08-31 MED ORDER — ADULT MULTIVITAMIN W/MINERALS CH: 1.0000 | ORAL_TABLET | Freq: Every day | ORAL | Status: AC

## 2018-08-31 MED ORDER — POTASSIUM CHLORIDE CRYS ER 20 MEQ PO TBCR
40.0000 meq | EXTENDED_RELEASE_TABLET | Freq: Once | ORAL | Status: AC
  Administered 2018-08-31: 14:00:00 40 meq via ORAL
  Filled 2018-08-31: qty 2

## 2018-08-31 MED ORDER — ASCORBIC ACID 250 MG PO TABS: 250.0000 mg | ORAL_TABLET | Freq: Two times a day (BID) | ORAL | Status: AC

## 2018-08-31 MED ORDER — MAGNESIUM SULFATE 2 GM/50ML IV SOLN
2.0000 g | Freq: Once | INTRAVENOUS | Status: AC
  Administered 2018-08-31: 2 g via INTRAVENOUS
  Filled 2018-08-31: qty 50

## 2018-08-31 MED ORDER — AMOXICILLIN-POT CLAVULANATE 875-125 MG PO TABS: 1.0000 | ORAL_TABLET | Freq: Two times a day (BID) | ORAL | 0 refills | Status: AC

## 2018-08-31 MED ORDER — RISAQUAD PO CAPS: 1.0000 | ORAL_CAPSULE | Freq: Every day | ORAL | Status: AC

## 2018-08-31 NOTE — Progress Notes (Signed)
CRITICAL VALUE ALERT  Critical Value:  Potassium 2.6   Date & Time Notied:  08/31/2018 0917  Provider Notified: Dr. Bridgett Larsson  Orders Received/Actions taken: See new orders for IV potassium orders

## 2018-08-31 NOTE — Progress Notes (Addendum)
Pharmacy Electrolyte Monitoring Consult:  Pharmacy consulted to assist in monitoring and replacing electrolytes in this 85 y.o. female admitted on 08/20/2018 with Wound Infection   Labs:  Sodium (mmol/L)  Date Value  08/29/2018 137  04/22/2015 140   Potassium (mmol/L)  Date Value  08/31/2018 2.6 (LL)   Magnesium (mg/dL)  Date Value  08/31/2018 1.8   Calcium (mg/dL)  Date Value  08/29/2018 8.5 (L)   Albumin (g/dL)  Date Value  08/20/2018 2.8 (L)    Assessment: 85 yo female with hypokalemia. Pt on lasix 20 mg PO daily.  K 3.2 yesterday and was given 40 mEq PO x1 K today 2.6 with Mag 1.8 SCr 0.76  Plan: MD has already ordered KCl 10 mEq IV x4 runs Will also order KCl 40 mEq PO x1 Mag sulfate 2g IV x1 Recheck K at 1800 BMP, Mag in AM   Rayna Sexton L 08/31/2018 11:40 AM

## 2018-08-31 NOTE — Progress Notes (Signed)
[facility] [place] NAME: Abigail Boyd    MR#:  [id number]  DATE OF BIRTH:  Feb 14, 1930  SUBJECTIVE:   Patient here due to a right heel/foot ulcer/infection and noted to have osteomyelitis.    Pt. Remains non-verbal. REVIEW OF SYSTEMS:    Review of Systems  Unable to perform ROS: Mental acuity    Nutrition: Soft diet Tolerating Diet: Yes Tolerating PT: bedbound DRUG ALLERGIES:   Allergies  Allergen Reactions  . Amlodipine Cough  . Clopidogrel Bisulfate Cough  . Kenalog [Triamcinolone Acetonide] Other (See Comments)    unknown  . Lisinopril Cough  . Losartan Cough  . Sulfa Antibiotics Hives and Rash    VITALS:  Blood pressure (!) 146/84, pulse 83, temperature 98.6 F (37 C), resp. rate 17, height 5\' 4"  (1.626 m), weight 65.3 kg, SpO2 96 %.  PHYSICAL EXAMINATION:   Physical Exam  GENERAL:  85 y.o.-year-old patient lying in bed non-verbal but in NAD.  EYES: Pupils equal, round, reactive to light and accommodation. No scleral icterus. Extraocular muscles intact.  HEENT: Head atraumatic, normocephalic. Oropharynx and nasopharynx clear.  NECK:  Supple, no jugular venous distention. No thyroid enlargement, no tenderness.  LUNGS: Normal breath sounds bilaterally, no wheezing, rales, rhonchi. No use of accessory muscles of respiration.  CARDIOVASCULAR: S1, S2 normal. No murmurs, rubs, or gallops.  ABDOMEN: Soft, nontender, nondistended. Bowel sounds present. No organomegaly or mass.  EXTREMITIES: No cyanosis, clubbing or edema b/l. Right heel ulcer with dressing in place    NEUROLOGIC: Cranial nerves II through XII are intact. No focal Motor or sensory deficits b/l. Globally weak.    PSYCHIATRIC: The patient is alert and oriented x 1.   SKIN: No obvious rash, lesion, right hell ulcer with   large black eschar on the posterior right heel is approximately 6 cm x 7 cm there is some mild erythema along the periphery          LABORATORY PANEL:   CBC Recent Labs  Lab 08/29/18 0515  WBC 11.9*  HGB 9.7*  HCT 30.7*  PLT 434*   ------------------------------------------------------------------------------------------------------------------  Chemistries  Recent Labs  Lab 08/29/18 0515  08/31/18 0430 08/31/18 0847  NA 137  --   --   --   K 3.0*   < >  --  2.6*  CL 106  --   --   --   CO2 21*  --   --   --   GLUCOSE 191*  --   --   --   BUN 12  --   --   --   CREATININE 0.89  --  0.76  --   CALCIUM 8.5*  --   --   --   MG 2.0  --  1.8  --    < > = values in this interval not displayed.   ------------------------------------------------------------------------------------------------------------------  Cardiac Enzymes No results for input(s): TROPONINI in the last 168 hours. ------------------------------------------------------------------------------------------------------------------  RADIOLOGY:  No results found.   ASSESSMENT AND PLAN:   85 year old female with past medical history of diabetes with CKD stage III, paroxysmal atrial fibrillation, coronary artery disease, essential hypertension, history of previous CVA who presented to the hospital due to a foul-smelling right heel ulcer.  1.  Diabetic ulcer on the right heel- suspected to be secondary to underlying osteomyelitis.  X-ray was negative but MRI confirmed osteomyelitis. -Seen by both podiatry and vascular surgery. -Patient underwent angiogram with angioplasty to the  right lower extremity to improve blood flow. -Vascular surgery discussed extensively with patient's daughter about surgical options and she does not want any aggressive care at this time and wants to pursue more comfort measures. Per palliative care consult, Daughter Abigail Boyd states her goal is residential hospice, but shares that she is unsure if she could accept no further antibiotics.  At this point it is questionable if Abigail Boyd would qualify for residential hospice.   Continue cefepime and vancomycin for now. Daughter has not decided whether continue antibiotics or not for case manager. Per Dr. Delaine Lame, changed to Augmentin for 3 more weeks.  2.  Diabetes type 2 with CKD stage III- continue sliding scale insulin, follow blood sugars.  3.  Paroxysmal atrial fibrillation- rate controlled. -Continue metoprolol.  Resumed Xarelto.  4.  Neuropathy secondary to diabetes- continue gabapentin.  5.  Depression- continue Prozac.  6.  Restless leg syndrome- continue Requip.  7.  Glaucoma- continue timolol eyedrops.  8.  GERD- continue Protonix.  9.  History of urinary incontinence- continue oxybutynin.  Hypokalemia.  Still low at 2.6, continue potassium supplement.  I called the patient's daughter, but still nobody answered the phone. All the records are reviewed and case discussed with Care Management/Social Worker. Management plans discussed with the patient, family and they are in agreement.  CODE STATUS: DNR  DVT Prophylaxis: Lovenox  TOTAL TIME TAKING CARE OF THIS PATIENT: 28 minutes.   POSSIBLE D/C IN 1-2 DAYS, DEPENDING ON CLINICAL CONDITION.   Demetrios Loll M.D on 08/31/2018 at 12:06 PM  Between 7am to 6pm - Pager - [phone]  After 6pm go to [email or web address] - [employer] [facility]  Office  [phone]  CC: Primary care physician; Kirk Ruths, MD

## 2018-08-31 NOTE — Discharge Instructions (Signed)
PALLIATIVE CARE FOLLOW UP IN SNF

## 2018-08-31 NOTE — Progress Notes (Signed)
PHARMACY CONSULT NOTE - FOLLOW UP  Pharmacy Consult for Electrolyte Monitoring and Replacement   Recent Labs: Potassium (mmol/L)  Date Value  08/31/2018 3.7   Magnesium (mg/dL)  Date Value  08/31/2018 1.8   Calcium (mg/dL)  Date Value  08/29/2018 8.5 (L)   Albumin (g/dL)  Date Value  08/20/2018 2.8 (L)   Sodium (mmol/L)  Date Value  08/29/2018 137  04/22/2015 140     Assessment: 7/4 :  K @ 1805 = 3.7   Goal of Therapy:  K :  3.5 - 5.1   Plan:  No additional K needed at this time; Will recheck K on 7/5 with AM labs.   Orene Desanctis ,PharmD Clinical Pharmacist 08/31/2018 6:34 PM

## 2018-09-01 LAB — CBC
HCT: 28.9 % — ABNORMAL LOW (ref 36.0–46.0)
Hemoglobin: 9.4 g/dL — ABNORMAL LOW (ref 12.0–15.0)
MCH: 30 pg (ref 26.0–34.0)
MCHC: 32.5 g/dL (ref 30.0–36.0)
MCV: 92.3 fL (ref 80.0–100.0)
Platelets: 370 10*3/uL (ref 150–400)
RBC: 3.13 MIL/uL — ABNORMAL LOW (ref 3.87–5.11)
RDW: 13.8 % (ref 11.5–15.5)
WBC: 10.9 10*3/uL — ABNORMAL HIGH (ref 4.0–10.5)
nRBC: 0 % (ref 0.0–0.2)

## 2018-09-01 LAB — GLUCOSE, CAPILLARY
Glucose-Capillary: 184 mg/dL — ABNORMAL HIGH (ref 70–99)
Glucose-Capillary: 210 mg/dL — ABNORMAL HIGH (ref 70–99)
Glucose-Capillary: 198 mg/dL — ABNORMAL HIGH (ref 70–99)
Glucose-Capillary: 207 mg/dL — ABNORMAL HIGH (ref 70–99)
Glucose-Capillary: 190 mg/dL — ABNORMAL HIGH (ref 70–99)

## 2018-09-01 LAB — BASIC METABOLIC PANEL
Anion gap: 7 (ref 5–15)
BUN: 15 mg/dL (ref 8–23)
CO2: 21 mmol/L — ABNORMAL LOW (ref 22–32)
Calcium: 8.6 mg/dL — ABNORMAL LOW (ref 8.9–10.3)
Chloride: 108 mmol/L (ref 98–111)
Creatinine, Ser: 0.92 mg/dL (ref 0.44–1.00)
GFR calc Af Amer: >60 mL/min (ref >60)
GFR calc non Af Amer: 56 mL/min — ABNORMAL LOW (ref >60)
Glucose, Bld: 199 mg/dL — ABNORMAL HIGH (ref 70–99)
Potassium: 3.3 mmol/L — ABNORMAL LOW (ref 3.5–5.1)
Sodium: 136 mmol/L (ref 135–145)

## 2018-09-01 LAB — MAGNESIUM: Magnesium: 2.4 mg/dL (ref 1.7–2.4)

## 2018-09-01 MED ORDER — POTASSIUM CHLORIDE 20 MEQ PO PACK: 40.0000 meq | PACK | Freq: Three times a day (TID) | ORAL | Status: DC

## 2018-09-01 MED ORDER — POTASSIUM CHLORIDE CRYS ER 20 MEQ PO TBCR
40.0000 meq | EXTENDED_RELEASE_TABLET | Freq: Three times a day (TID) | ORAL | Status: AC
  Administered 2018-09-01 (×2): 40 meq via ORAL
  Filled 2018-09-01 (×2): qty 2

## 2018-09-01 NOTE — Progress Notes (Signed)
[facility] [place] NAME: Abigail Boyd    MR#:  [id number]  DATE OF BIRTH:  Aug 31, 1929  SUBJECTIVE:   Patient here due to a right heel/foot ulcer/infection and noted to have osteomyelitis.    Pt. Remains non-verbal. REVIEW OF SYSTEMS:    Review of Systems  Unable to perform ROS: Mental acuity    Nutrition: Soft diet Tolerating Diet: Yes Tolerating PT: bedbound DRUG ALLERGIES:   Allergies  Allergen Reactions  . Amlodipine Cough  . Clopidogrel Bisulfate Cough  . Kenalog [Triamcinolone Acetonide] Other (See Comments)    unknown  . Lisinopril Cough  . Losartan Cough  . Sulfa Antibiotics Hives and Rash    VITALS:  Blood pressure 137/73, pulse 95, temperature 98.4 F (36.9 C), temperature source Oral, resp. rate 18, height 5\' 4"  (1.626 m), weight 65.3 kg, SpO2 98 %.  PHYSICAL EXAMINATION:   Physical Exam  GENERAL:  85 y.o.-year-old patient lying in bed non-verbal but in NAD.  EYES: Pupils equal, round, reactive to light and accommodation. No scleral icterus. Extraocular muscles intact.  HEENT: Head atraumatic, normocephalic. Oropharynx and nasopharynx clear.  NECK:  Supple, no jugular venous distention. No thyroid enlargement, no tenderness.  LUNGS: Normal breath sounds bilaterally, no wheezing, rales, rhonchi. No use of accessory muscles of respiration.  CARDIOVASCULAR: S1, S2 normal. No murmurs, rubs, or gallops.  ABDOMEN: Soft, nontender, nondistended. Bowel sounds present. No organomegaly or mass.  EXTREMITIES: No cyanosis, clubbing or edema b/l. Right heel ulcer with dressing in place    NEUROLOGIC: Cranial nerves II through XII are intact. No focal Motor or sensory deficits b/l. Globally weak.    PSYCHIATRIC: The patient is alert and oriented x 1.   SKIN: No obvious rash, lesion, right hell ulcer with   large black eschar on the posterior right heel is approximately 6 cm x 7 cm there is some mild erythema along the  periphery         LABORATORY PANEL:   CBC Recent Labs  Lab 09/01/18 0600  WBC 10.9*  HGB 9.4*  HCT 28.9*  PLT 370   ------------------------------------------------------------------------------------------------------------------  Chemistries  Recent Labs  Lab 09/01/18 0600  NA 136  K 3.3*  CL 108  CO2 21*  GLUCOSE 199*  BUN 15  CREATININE 0.92  CALCIUM 8.6*  MG 2.4   ------------------------------------------------------------------------------------------------------------------  Cardiac Enzymes No results for input(s): TROPONINI in the last 168 hours. ------------------------------------------------------------------------------------------------------------------  RADIOLOGY:  No results found.   ASSESSMENT AND PLAN:   85 year old female with past medical history of diabetes with CKD stage III, paroxysmal atrial fibrillation, coronary artery disease, essential hypertension, history of previous CVA who presented to the hospital due to a foul-smelling right heel ulcer.  1.  Diabetic ulcer on the right heel- suspected to be secondary to underlying osteomyelitis.  X-ray was negative but MRI confirmed osteomyelitis. -Seen by both podiatry and vascular surgery. -Patient underwent angiogram with angioplasty to the right lower extremity to improve blood flow. -Vascular surgery discussed extensively with patient's daughter about surgical options and she does not want any aggressive care at this time and wants to pursue more comfort measures. Per palliative care consult, Daughter [name] states her goal is residential hospice, but shares that she is unsure if she could accept no further antibiotics.  At this point it is questionable if Abigail Boyd would qualify for residential hospice.  Continue cefepime and vancomycin for now. Daughter has not decided whether continue antibiotics or  not for case manager. Per Dr. Delaine Lame, changed to Augmentin for 3 more weeks.   2.  Diabetes type 2 with CKD stage III- continue sliding scale insulin, follow blood sugars.  3.  Paroxysmal atrial fibrillation- rate controlled. -Continue metoprolol.  Resumed Xarelto.  4.  Neuropathy secondary to diabetes- continue gabapentin.  5.  Depression- continue Prozac.  6.  Restless leg syndrome- continue Requip.  7.  Glaucoma- continue timolol eyedrops.  8.  GERD- continue Protonix.  9.  History of urinary incontinence- continue oxybutynin.  Hypokalemia.  K 3.3, continue potassium supplement.  I called the patient's daughter, but still nobody answered the phone. All the records are reviewed and case discussed with Care Management/Social Worker. Management plans discussed with the patient, family and they are in agreement.  CODE STATUS: DNR  DVT Prophylaxis: Lovenox  TOTAL TIME TAKING CARE OF THIS PATIENT: 25 minutes.   POSSIBLE D/C to SNF IN 1-2 DAYS, DEPENDING ON CLINICAL CONDITION.   Demetrios Loll M.D on 09/01/2018 at 10:14 AM  Between 7am to 6pm - Pager - [phone]  After 6pm go to [email or web address] - [employer] [facility]  Office  [phone]  CC: Primary care physician; Kirk Ruths, MD

## 2018-09-01 NOTE — Progress Notes (Signed)
Pharmacy Electrolyte Monitoring Consult:  Pharmacy consulted to assist in monitoring and replacing electrolytes in this 85 y.o. female admitted on 08/20/2018 with Wound Infection   Labs:  Sodium (mmol/L)  Date Value  09/01/2018 136  04/22/2015 140   Potassium (mmol/L)  Date Value  09/01/2018 3.3 (L)   Magnesium (mg/dL)  Date Value  09/01/2018 2.4   Calcium (mg/dL)  Date Value  09/01/2018 8.6 (L)   Albumin (g/dL)  Date Value  08/20/2018 2.8 (L)   Corrected Ca: 9.56 mg/dL  Assessment: 85 yo female with hypokalemia. Pt on lasix 20 mg PO daily.  K 2.6 yesterday and was given 40 mEq PO x 1 and 40 mEq IV KCl Mag 1.8 yesterday and was given 2 grams IV magnesium sulfate  Plan:  Will order KCl 40 mEq PO BID x 2  Recheck electrolytes with am labs  Dallie Piles, PharmD 09/01/2018 8:04 AM

## 2018-09-02 LAB — GLUCOSE, CAPILLARY
Glucose-Capillary: 194 mg/dL — ABNORMAL HIGH (ref 70–99)
Glucose-Capillary: 204 mg/dL — ABNORMAL HIGH (ref 70–99)
Glucose-Capillary: 249 mg/dL — ABNORMAL HIGH (ref 70–99)

## 2018-09-02 LAB — POTASSIUM: Potassium: 3.8 mmol/L (ref 3.5–5.1)

## 2018-09-02 MED ORDER — POTASSIUM CHLORIDE CRYS ER 10 MEQ PO TBCR
20.0000 meq | EXTENDED_RELEASE_TABLET | Freq: Once | ORAL | Status: AC
  Administered 2018-09-02: 20 meq via ORAL
  Filled 2018-09-02: qty 2

## 2018-09-02 MED ORDER — INSULIN GLARGINE 300 UNIT/ML ~~LOC~~ SOPN: 6.0000 [IU] | PEN_INJECTOR | Freq: Every day | SUBCUTANEOUS | Status: AC

## 2018-09-02 MED ORDER — HUMALOG KWIKPEN 100 UNIT/ML ~~LOC~~ SOPN: 3.0000 [IU] | PEN_INJECTOR | Freq: Three times a day (TID) | SUBCUTANEOUS | 3 refills | Status: AC

## 2018-09-02 NOTE — Progress Notes (Signed)
Report called to [facility]; Nurse [name] took report. Room assignment to be issued no arrival per nurse. EMS called for transport.

## 2018-09-02 NOTE — TOC Transition Note (Signed)
Transition of Care [facility]) - CM/SW Discharge Note   Patient Details  Name: Abigail Boyd MRN: [id number] Date of Birth: 1929/07/18  [facility] CM/SW Contact:  Su Hilt, RN Phone Number: 09/02/2018, 9:37 AM   Clinical Narrative:    Patient to discharge back to [facility] today Dr Leslye Peer confirmed with the daughter  Magda Paganini at [place] aware that the patient is returning today.   Nurse to call report to [phone] Nurse to call EMS when ready to transport   Final next level of care: Skilled Nursing Facility Barriers to Discharge: Barriers Resolved   Patient Goals and CMS Choice        Discharge Placement                       Discharge Plan and Services In-house Referral: Clinical Social Work Discharge Planning Services: CM Consult                                 Social Determinants of Health (SDOH) Interventions     Readmission Risk Interventions No flowsheet data found.

## 2018-09-02 NOTE — Progress Notes (Signed)
Pharmacy Electrolyte Monitoring Consult:  Pharmacy consulted to assist in monitoring and replacing electrolytes in this 85 y.o. female admitted on 08/20/2018 with Wound Infection   Labs:  Sodium (mmol/L)  Date Value  09/01/2018 136  04/22/2015 140   Potassium (mmol/L)  Date Value  09/02/2018 3.8   Magnesium (mg/dL)  Date Value  09/01/2018 2.4   Calcium (mg/dL)  Date Value  09/01/2018 8.6 (L)   Albumin (g/dL)  Date Value  08/20/2018 2.8 (L)   Corrected Ca: 9.56 mg/dL  Assessment: 85 yo female with hypokalemia. Pt on lasix 20 mg PO daily.    Plan: K 3.8   Will order KCl 20 meq po x 1 as pt on lasix  Recheck electrolytes with am labs  Ozella Comins A, PharmD 09/02/2018 7:17 AM

## 2018-09-02 NOTE — Discharge Summary (Signed)
[facility] [place] NAME: Abigail Boyd    MR#:  [id number]  DATE OF BIRTH:  [birth year]  DATE OF ADMISSION:  08/20/2018 ADMITTING PHYSICIAN: [name], MD  DATE OF DISCHARGE: 09/02/2018  PRIMARY CARE PHYSICIAN: Kirk Ruths, MD    ADMISSION DIAGNOSIS:  Sepsis due to cellulitis ([place]) [L03.90, A41.9] Osteomyelitis of right foot, unspecified type ([place]) [M86.9]  DISCHARGE DIAGNOSIS:  Active Problems:   Osteomyelitis ([place])   Pressure injury of skin   Encounter for hospice care discussion   Sepsis due to cellulitis [facility])   SECONDARY DIAGNOSIS:   Past Medical History:  Diagnosis Date  . Abnormal nuclear stress test 06/21/2011   Inferolateral reversible defect;--> cardiac cath & PCI of CxOM, occluded RCA with collaterals;; followup Myoview 11/2012: Low risk. Fixed basal inferior artifact normal EF. No ischemia  . Anxiety   . Asthma   . Bilateral cataracts    Status post stroke or correction  . CAD S/P percutaneous coronary angioplasty 06/2011   s/P PCI to proximal OM1 w/ DES; occluded RCA with bridging and L-R collaterals (medical management)  . Chronic kidney disease (CKD), stage II (mild)    Related to current bladder infections (although diabetes cannot be excluded)  . Chronotropic incompetence with sinus node dysfunction [facility]) October 2013   On CPET test; beta blockers reduced  . Diabetes mellitus type 2, controlled ([place])    On oral medications  . Diverticulitis   . Essential hypertension    Allowing for permissive hypertension to avoid orthostatic hypotension  . GERD (gastroesophageal reflux disease)    On PPI  . Glaucoma   . History of syncope    Per EP - neurocardiogenic & not Bradycardia related (no PPM)  . History of unstable angina 06/13/2011   Jaw pain awakening from sleep -- Myoview --CATH --> PCI  . Hyperlipidemia with target LDL less than 70    HDL at goal, LDL not at goal, borderline triglycerides.  On Crestor 20 mg  . Migraines   . OSA (obstructive sleep apnea)    hx bladder infections  . Osteoarthritis   . PAF (paroxysmal atrial fibrillation) ([place]) 10-11/ 2014   CardioNet Event Monitor: NSR & S Brady -- Rates 50-100; Total A. fib burden 35 hours and 27 minutes. 1296 episodes, longest was 1 hour 29 minutes. Rate ranged from 52-169 beats per minute.  . Seasonal allergies   . Shortness of breath on exertion October 2013   2-D echo: Normal EF>55%, Gr 1DD, mild aortic sclerosis; Evaluated with CPET - peak VO2 97%; Chronotropic Incompetence (submaximal effort)  . Spinal stenosis of lumbar region 11/2011  . Stroke ([place]) 06/2017  . Stroke ([place]) 11/2014  . Tachycardia-bradycardia syndrome ([place]) 11/2012  . Urge incontinence     HOSPITAL COURSE:   1.  Osteomyelitis of the right heel with diabetic foot ulcer.  MRI confirmed osteomyelitis.  Patient was seen by both podiatry and vascular surgery.  Patient underwent angiogram with angioplasty of the right lower extremity to try to improve blood flow.  Case discussed with patient's daughter and she does not want any aggressive amputation at this time.  Patient was seen by infectious disease and because of this decision she recommended 3 weeks of oral Augmentin with last dose being through 09/21/2018.  Patient will be followed by palliative care and possible conversion over to hospice if patient declines further.  Because of the nonsurgical approach this likely will recur once antibiotics are stopped.  Overall prognosis is poor.  Patient is a DNR.  Dry nonstick dressing to right heel and heel protector while in bed. 2.  Type 2 diabetes with chronic kidney disease stage III.  Patient was held off her diabetic medications during the hospitalization.  Since her sugars are trending up in the 200s can start back low-dose Toujeo 6 units at night and 3 units of short acting insulin prior to meals.  Check fingersticks q. before meals and nightly. 3.  Paroxysmal  atrial fibrillation continue metoprolol and Xarelto. 4.  Diabetic neuropathy on gabapentin 5.  Depression on Prozac 6.  Restless leg syndrome on Requip 7.  Glaucoma unspecified continue eyedrops 8.  GERD on PPI 9.  Urinary incontinence on oxybutynin 10.  Palliative care to follow at facility.  Conversion to hospice if patient declines.  DISCHARGE CONDITIONS:   Fair  CONSULTS OBTAINED:   Podiatry, vascular surgery, infectious disease DRUG ALLERGIES:   Allergies  Allergen Reactions  . Amlodipine Cough  . Clopidogrel Bisulfate Cough  . Kenalog [Triamcinolone Acetonide] Other (See Comments)    unknown  . Lisinopril Cough  . Losartan Cough  . Sulfa Antibiotics Hives and Rash    DISCHARGE MEDICATIONS:   Allergies as of 09/02/2018      Reactions   Amlodipine Cough   Clopidogrel Bisulfate Cough   Kenalog [triamcinolone Acetonide] Other (See Comments)   unknown   Lisinopril Cough   Losartan Cough   Sulfa Antibiotics Hives, Rash      Medication List    STOP taking these medications   levETIRAcetam 500 MG tablet Commonly known as: KEPPRA   traMADol 50 MG tablet Commonly known as: ULTRAM     TAKE these medications   acidophilus Caps capsule Take 1 capsule by mouth daily.   amoxicillin-clavulanate 875-125 MG tablet Commonly known as: AUGMENTIN Take 1 tablet by mouth every 12 (twelve) hours for 21 days.   ascorbic acid 250 MG tablet Commonly known as: VITAMIN C Take 1 tablet (250 mg total) by mouth 2 (two) times daily.   azelastine 0.1 % nasal spray Commonly known as: ASTELIN Place 2 sprays into both nostrils at bedtime. Use in each nostril as directed   feeding supplement (ENSURE ENLIVE) Liqd Take 237 mLs by mouth 2 (two) times daily between meals.   FLUoxetine 40 MG capsule Commonly known as: PROZAC Take 40 mg by mouth daily.   furosemide 20 MG tablet Commonly known as: LASIX Take 20 mg by mouth daily.   gabapentin 300 MG capsule Commonly known as:  NEURONTIN Take 300 mg by mouth 3 (three) times daily.   Glucagon Emergency 1 MG injection Generic drug: glucagon Inject 1 mg into the vein once as needed. Give 1 mg IM for Hypoglycemia (symptomatic) not responding to oral glucose, snack. May repeat x 1 after 15 minutes. USE ONLY IF PT IS OR IS BECOMING UNRESPONSIVE   HumaLOG KwikPen 100 UNIT/ML KwikPen Generic drug: insulin lispro Inject 0.03 mLs (3 Units total) into the skin 3 (three) times daily with meals. Give with meals for cbg > = 150 What changed: how much to take   Insulin Glargine 300 UNIT/ML Sopn Inject 6 Units into the skin at bedtime. Alternate abdominal injection sites. What changed: how much to take   latanoprost 0.005 % ophthalmic solution Commonly known as: XALATAN Place 1 drop into both eyes at bedtime.   levocetirizine 5 MG tablet Commonly known as: XYZAL Take 2.5 mg by mouth daily.   Magnesium 500 MG Caps Take  500 mg by mouth at bedtime.   metoprolol tartrate 25 MG tablet Commonly known as: LOPRESSOR Take 12.5 mg by mouth daily.   mirtazapine 15 MG tablet Commonly known as: REMERON Take 7.5 mg by mouth at bedtime. What changed: Another medication with the same name was removed. Continue taking this medication, and follow the directions you see here.   multivitamin with minerals Tabs tablet Take 1 tablet by mouth daily.   nitroGLYCERIN 0.4 MG SL tablet Commonly known as: NITROSTAT Place 0.4 mg under the tongue every 5 (five) minutes as needed for chest pain.   omeprazole 40 MG capsule Commonly known as: PRILOSEC Take 40 mg by mouth daily.   oxybutynin 5 MG tablet Commonly known as: DITROPAN Take 5 mg by mouth 2 (two) times daily.   oxyCODONE-acetaminophen 5-325 MG tablet Commonly known as: PERCOCET/ROXICET Take 1 tablet by mouth every 6 (six) hours as needed for moderate pain or severe pain. What changed: Another medication with the same name was removed. Continue taking this medication, and  follow the directions you see here.   polyethylene glycol 17 g packet Commonly known as: MIRALAX / GLYCOLAX Give 17 gm by mouth once every morning   Rivaroxaban 15 MG Tabs tablet Commonly known as: XARELTO Take 15 mg by mouth every evening.   rOPINIRole 0.5 MG tablet Commonly known as: REQUIP TAKE 1 TABLET BY MOUTH AT  BEDTIME   senna 8.6 MG Tabs tablet Commonly known as: SENOKOT Take 2 tablets by mouth at bedtime.   silver sulfADIAZINE 1 % cream Commonly known as: SILVADENE Apply 1 application topically daily as needed (wound care).   Stool Softener 100 MG capsule Generic drug: docusate sodium Take 100 mg by mouth daily.   timolol 0.5 % ophthalmic solution Commonly known as: TIMOPTIC Place 1 drop into both eyes at bedtime.   trolamine salicylate 10 % cream Commonly known as: ASPERCREME Apply 1 application topically 3 (three) times daily. Apply to right knee and ankle for pain        DISCHARGE INSTRUCTIONS:   Follow-up with Dr. at [facility]. Follow-up with palliative care at facility  If you experience worsening of your admission symptoms, develop shortness of breath, life threatening emergency, suicidal or homicidal thoughts you must seek medical attention immediately by calling 911 or calling your MD immediately  if symptoms less severe.  You Must read complete instructions/literature along with all the possible adverse reactions/side effects for all the Medicines you take and that have been prescribed to you. Take any new Medicines after you have completely understood and accept all the possible adverse reactions/side effects.   Please note  You were cared for by a hospitalist during your hospital stay. If you have any questions about your discharge medications or the care you received while you were in the hospital after you are discharged, you can call the unit and asked to speak with the hospitalist on call if the hospitalist that took care of you is not  available. Once you are discharged, your primary care physician will handle any further medical issues. Please note that NO REFILLS for any discharge medications will be authorized once you are discharged, as it is imperative that you return to your primary care physician (or establish a relationship with a primary care physician if you do not have one) for your aftercare needs so that they can reassess your need for medications and monitor your lab values.    Today   CHIEF COMPLAINT:   Chief  Complaint  Patient presents with  . Wound Infection    HISTORY OF PRESENT ILLNESS:  Sheketa Ende  is a 85 y.o. female came in with a right heel wound  VITAL SIGNS:  Blood pressure (!) 176/80, pulse 92, temperature 98 F (36.7 C), temperature source Oral, resp. rate 15, height 5\' 4"  (1.626 m), weight 65.3 kg, SpO2 98 %.  I/O:    Intake/Output Summary (Last 24 hours) at 09/02/2018 0837 Last data filed at 09/02/2018 0430 Gross per 24 hour  Intake 45365 ml  Output 250 ml  Net 45115 ml    PHYSICAL EXAMINATION:  GENERAL:  85 y.o.-year-old patient lying in the bed with no acute distress.  EYES: Pupils equal, round, reactive to light and accommodation. No scleral icterus. Extraocular muscles intact.  HEENT: Head atraumatic, normocephalic. Oropharynx and nasopharynx clear.  NECK:  Supple, no jugular venous distention. No thyroid enlargement, no tenderness.  LUNGS: Normal breath sounds bilaterally, no wheezing, rales,rhonchi or crepitation. No use of accessory muscles of respiration.  CARDIOVASCULAR: S1, S2 normal. No murmurs, rubs, or gallops.  ABDOMEN: Soft, non-tender, non-distended. Bowel sounds present. No organomegaly or mass.  EXTREMITIES: Trace pedal edema, no cyanosis, or clubbing.  NEUROLOGIC: Patient able to move her upper extremities to command PSYCHIATRIC: The patient is alert and tries to verbalize but I cannot understand her.Marland Kitchen  SKIN: Right heel gangrene covered today  DATA REVIEW:    CBC Recent Labs  Lab 09/01/18 0600  WBC 10.9*  HGB 9.4*  HCT 28.9*  PLT 370    Chemistries  Recent Labs  Lab 09/01/18 0600 09/02/18 0437  NA 136  --   K 3.3* 3.8  CL 108  --   CO2 21*  --   GLUCOSE 199*  --   BUN 15  --   CREATININE 0.92  --   CALCIUM 8.6*  --   MG 2.4  --      Microbiology Results  Results for orders placed or performed during the hospital encounter of 08/20/18  Blood culture (routine x 2)     Status: None   Collection Time: 08/20/18  1:04 PM   Specimen: Left Antecubital; Blood  Result Value Ref Range Status   Specimen Description LEFT ANTECUBITAL Blood Culture adequate volume  Final   Special Requests NONE  Final   Culture   Final    NO GROWTH 5 DAYS Performed at [facility], [address] [place], [place] [address]    Report Status 08/25/2018 FINAL  Final  Blood culture (routine x 2)     Status: None   Collection Time: 08/20/18  1:07 PM   Specimen: Right Antecubital; Blood  Result Value Ref Range Status   Specimen Description RIGHT ANTECUBITAL Blood Culture adequate volume  Final   Special Requests NONE  Final   Culture   Final    NO GROWTH 5 DAYS Performed at [facility], [address] [place], [place] [address]    Report Status 08/25/2018 FINAL  Final  SARS Coronavirus 2 (CEPHEID - Performed in [place] hospital lab), Hosp Order     Status: None   Collection Time: 08/20/18  2:43 PM   Specimen: Nasopharyngeal Swab  Result Value Ref Range Status   SARS Coronavirus 2 NEGATIVE NEGATIVE Final    Comment: (NOTE) If result is NEGATIVE SARS-CoV-2 target nucleic acids are NOT DETECTED. The SARS-CoV-2 RNA is generally detectable in upper and lower  respiratory specimens during the acute phase of infection. The lowest  concentration of SARS-CoV-2 viral copies this assay can detect is 250  copies / mL. A negative result does not preclude SARS-CoV-2 infection  and should not be used as the sole basis for  treatment or other  patient management decisions.  A negative result may occur with  improper specimen collection / handling, submission of specimen other  than nasopharyngeal swab, presence of viral mutation(s) within the  areas targeted by this assay, and inadequate number of viral copies  (<250 copies / mL). A negative result must be combined with clinical  observations, patient history, and epidemiological information. If result is POSITIVE SARS-CoV-2 target nucleic acids are DETECTED. The SARS-CoV-2 RNA is generally detectable in upper and lower  respiratory specimens dur ing the acute phase of infection.  Positive  results are indicative of active infection with SARS-CoV-2.  Clinical  correlation with patient history and other diagnostic information is  necessary to determine patient infection status.  Positive results do  not rule out bacterial infection or co-infection with other viruses. If result is PRESUMPTIVE POSTIVE SARS-CoV-2 nucleic acids MAY BE PRESENT.   A presumptive positive result was obtained on the submitted specimen  and confirmed on repeat testing.  While 2019 novel coronavirus  (SARS-CoV-2) nucleic acids may be present in the submitted sample  additional confirmatory testing may be necessary for epidemiological  and / or clinical management purposes  to differentiate between  SARS-CoV-2 and other Sarbecovirus currently known to infect humans.  If clinically indicated additional testing with an alternate test  methodology [phone]) is advised. The SARS-CoV-2 RNA is generally  detectable in upper and lower respiratory sp ecimens during the acute  phase of infection. The expected result is Negative. Fact Sheet for Patients:  StrictlyIdeas.no Fact Sheet for Healthcare Providers: BankingDealers.co.za This test is not yet approved or cleared by the [place] FDA and has been authorized for detection and/or  diagnosis of SARS-CoV-2 by FDA under an Emergency Use Authorization (EUA).  This EUA will remain in effect (meaning this test can be used) for the duration of the COVID-19 declaration under Section 564(b)(1) of the Act, 21 U.S.C. section 360bbb-3(b)(1), unless the authorization is terminated or revoked sooner. Performed at [facility], [address] [place], [place] [address]   MRSA PCR Screening     Status: None   Collection Time: 08/20/18  9:16 PM   Specimen: Nasal Mucosa; Nasopharyngeal  Result Value Ref Range Status   MRSA by PCR NEGATIVE NEGATIVE Final    Comment:        The GeneXpert MRSA Assay (FDA approved for NASAL specimens only), is one component of a comprehensive MRSA colonization surveillance program. It is not intended to diagnose MRSA infection nor to guide or monitor treatment for MRSA infections. Performed at [facility], [address], [place], [place] [address]   SARS Coronavirus 2 (CEPHEID - Performed in [facility] hospital lab), Hosp Order     Status: None   Collection Time: 08/29/18  5:26 PM   Specimen: Nasopharyngeal Swab  Result Value Ref Range Status   SARS Coronavirus 2 NEGATIVE NEGATIVE Final    Comment: (NOTE) If result is NEGATIVE SARS-CoV-2 target nucleic acids are NOT DETECTED. The SARS-CoV-2 RNA is generally detectable in upper and lower  respiratory specimens during the acute phase of infection. The lowest  concentration of SARS-CoV-2 viral copies this assay can detect is 250  copies / mL. A negative result does not preclude SARS-CoV-2 infection  and should not be used  as the sole basis for treatment or other  patient management decisions.  A negative result may occur with  improper specimen collection / handling, submission of specimen other  than nasopharyngeal swab, presence of viral mutation(s) within the  areas targeted by this assay, and inadequate number of viral copies  (<250 copies / mL). A negative result  must be combined with clinical  observations, patient history, and epidemiological information. If result is POSITIVE SARS-CoV-2 target nucleic acids are DETECTED. The SARS-CoV-2 RNA is generally detectable in upper and lower  respiratory specimens dur ing the acute phase of infection.  Positive  results are indicative of active infection with SARS-CoV-2.  Clinical  correlation with patient history and other diagnostic information is  necessary to determine patient infection status.  Positive results do  not rule out bacterial infection or co-infection with other viruses. If result is PRESUMPTIVE POSTIVE SARS-CoV-2 nucleic acids MAY BE PRESENT.   A presumptive positive result was obtained on the submitted specimen  and confirmed on repeat testing.  While 2019 novel coronavirus  (SARS-CoV-2) nucleic acids may be present in the submitted sample  additional confirmatory testing may be necessary for epidemiological  and / or clinical management purposes  to differentiate between  SARS-CoV-2 and other Sarbecovirus currently known to infect humans.  If clinically indicated additional testing with an alternate test  methodology [phone]) is advised. The SARS-CoV-2 RNA is generally  detectable in upper and lower respiratory sp ecimens during the acute  phase of infection. The expected result is Negative. Fact Sheet for Patients:  StrictlyIdeas.no Fact Sheet for Healthcare Providers: BankingDealers.co.za This test is not yet approved or cleared by the [place] FDA and has been authorized for detection and/or diagnosis of SARS-CoV-2 by FDA under an Emergency Use Authorization (EUA).  This EUA will remain in effect (meaning this test can be used) for the duration of the COVID-19 declaration under Section 564(b)(1) of the Act, 21 U.S.C. section 360bbb-3(b)(1), unless the authorization is terminated or revoked sooner. Performed at [facility], [address], [place], [place] [address]       Management plans discussed with the patient, family and they are in agreement.  CODE STATUS:     Code Status Orders  (From admission, onward)         Start     Ordered   08/20/18 2100  Do not attempt resuscitation (DNR)  Continuous    Question Answer Comment  In the event of cardiac or respiratory ARREST Do not call a "code blue"   In the event of cardiac or respiratory ARREST Do not perform Intubation, CPR, defibrillation or ACLS   In the event of cardiac or respiratory ARREST Use medication by any route, position, wound care, and other measures to relive pain and suffering. May use oxygen, suction and manual treatment of airway obstruction as needed for comfort.      08/20/18 2059        Code Status History    Date Active Date Inactive Code Status Order ID Comments User Context   12/22/2017 0108 12/27/2017 1631 DNR [id number]  Elwyn Reach, MD Inpatient   07/22/2017 0002 07/25/2017 1915 DNR [id number]  Vianne Bulls, MD ED   07/05/2017 2005 07/09/2017 2224 DNR [id number]  Saundra Shelling, MD Inpatient   07/05/2017 1818 07/05/2017 2004 DNR [id number]  Saundra Shelling, MD ED   04/23/2017 2216 04/25/2017 2153 DNR [id number]  [name], MD Inpatient   12/26/2014 1822 12/29/2014 1632 Full Code  [id number]  Allie Bossier, MD ED   12/19/2012 2229 12/20/2012 2124 Full Code [id number]  Consuelo Pandy, PA-C Inpatient   Advance Care Planning Activity    Advance Directive Documentation     Most Recent Value  Type of Advance Directive  Out of facility DNR (pink MOST or yellow form)  Pre-existing out of facility DNR order (yellow form or pink MOST form)  -  "MOST" Form in Place?  -      TOTAL TIME TAKING CARE OF THIS PATIENT: 34 minutes.    Loletha Grayer M.D on 09/02/2018 at 8:37 AM  Between 7am to 6pm - Pager - [phone]  After 6pm go to [email or web address] - password [email or web address]  [facility]   [phone]  CC: Primary care physician; Kirk Ruths, MD

## 2018-09-02 NOTE — Progress Notes (Signed)
Patient discharge to SNF- patient left the unit via stretcher with EMS

## 2018-09-02 NOTE — Progress Notes (Signed)
EMS called for transport back to [facility].

## 2018-09-03 DIAGNOSIS — I69320 Aphasia following cerebral infarction: Secondary | ICD-10-CM | POA: Diagnosis not present

## 2018-09-03 DIAGNOSIS — I4891 Unspecified atrial fibrillation: Secondary | ICD-10-CM | POA: Diagnosis not present

## 2018-09-03 DIAGNOSIS — I70239 Atherosclerosis of native arteries of right leg with ulceration of unspecified site: Secondary | ICD-10-CM | POA: Diagnosis not present

## 2018-09-03 DIAGNOSIS — G8191 Hemiplegia, unspecified affecting right dominant side: Secondary | ICD-10-CM | POA: Diagnosis not present

## 2018-09-03 DIAGNOSIS — M86271 Subacute osteomyelitis, right ankle and foot: Secondary | ICD-10-CM | POA: Diagnosis not present

## 2018-09-11 DIAGNOSIS — Z03818 Encounter for observation for suspected exposure to other biological agents ruled out: Secondary | ICD-10-CM | POA: Diagnosis not present

## 2018-09-28 DEATH — deceased

## 2020-07-01 IMAGING — MR MR OF THE RIGHT HEEL WITHOUT CONTRAST
5 series · 36 of 40 positions shown · non-contrast
Comparison: Radiographs 08/20/2018

CLINICAL DATA: Large heel ulcer.

EXAM:
MR OF THE RIGHT HEEL WITHOUT CONTRAST
TECHNIQUE: Multiplanar, multisequence MR imaging of the right foot was
performed. No intravenous contrast was administered.

[Series 2: PD fat-sat · coronal · right · 3.0mm · 0.62mm/px · 9 of 36 slices shown]
[im 1/36]
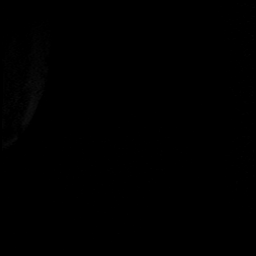
[im 5/36]
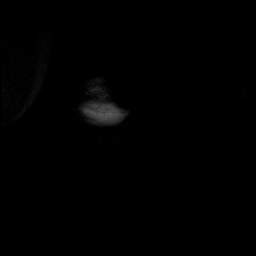
[im 9/36]
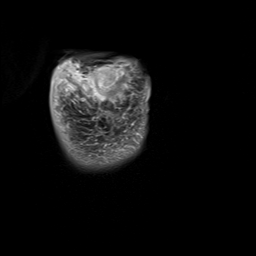
[im 14/36]
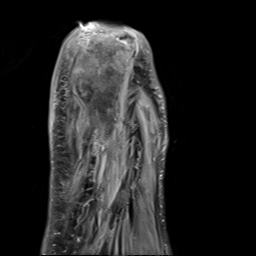
[im 18/36]
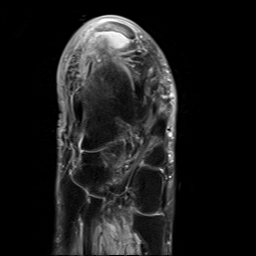
[im 22/36]
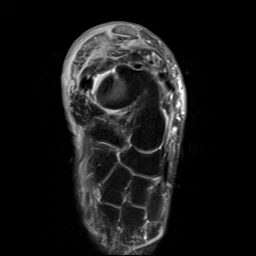
[im 27/36]
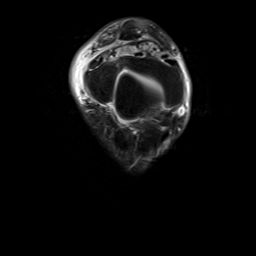
[im 31/36]
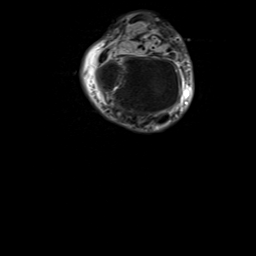
[im 36/36]
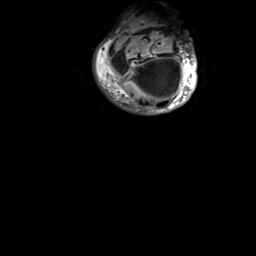

[Series 3: T2 fat-sat · coronal · right · 3.0mm · 0.62mm/px · 9 of 36 slices shown]
[im 1/36]
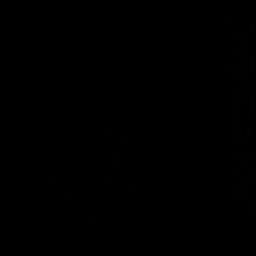
[im 5/36]
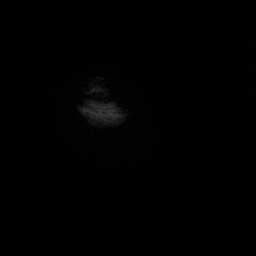
[im 9/36]
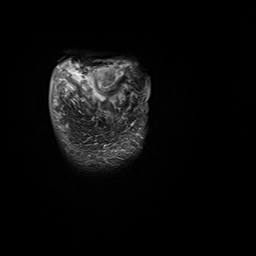
[im 14/36]
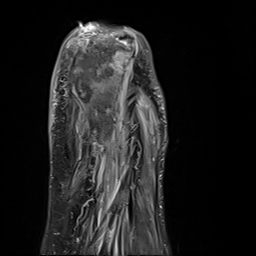
[im 18/36]
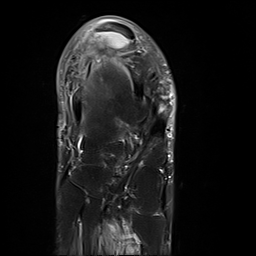
[im 22/36]
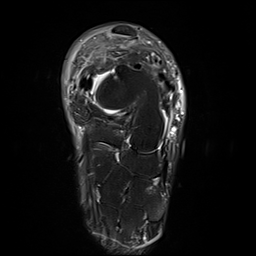
[im 27/36]
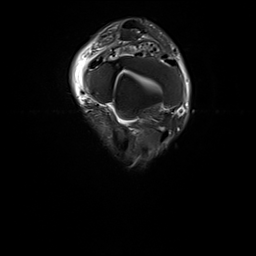
[im 31/36]
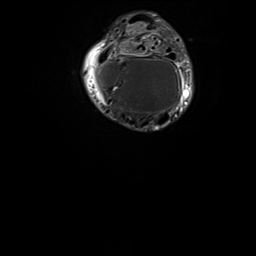
[im 36/36]
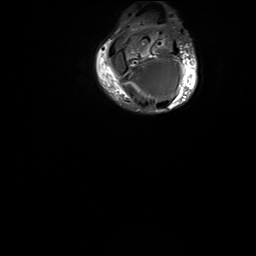

[Series 5: STIR · oblique · right · 3.0mm · 0.31mm/px · 8 of 40 slices shown (1 of 2)]
[im 1/40]
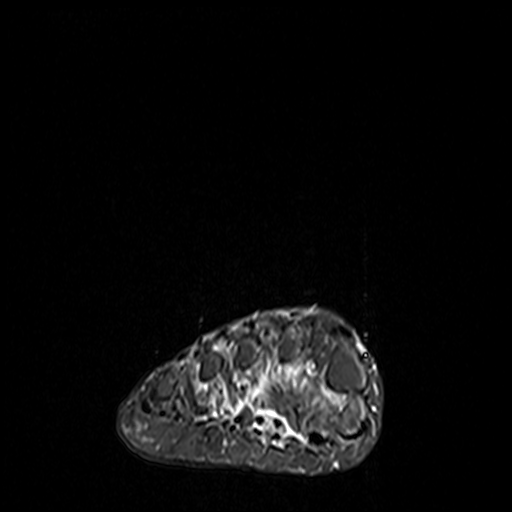
[im 5/40]
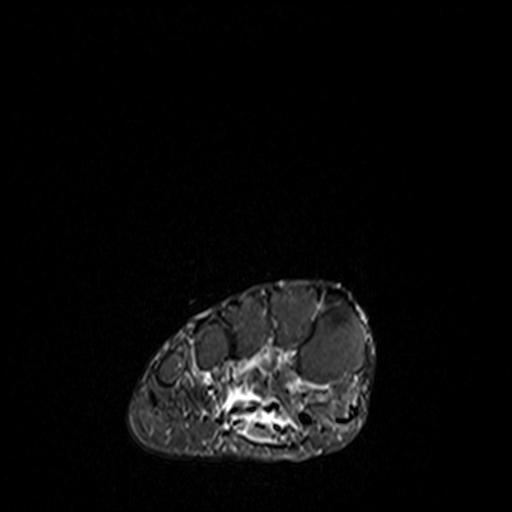
[im 14/40]
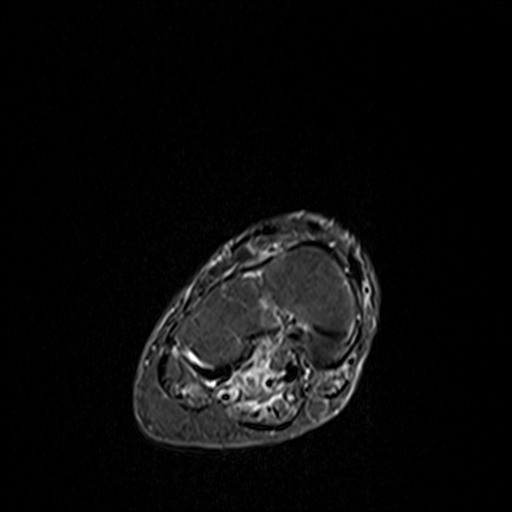
[im 18/40]
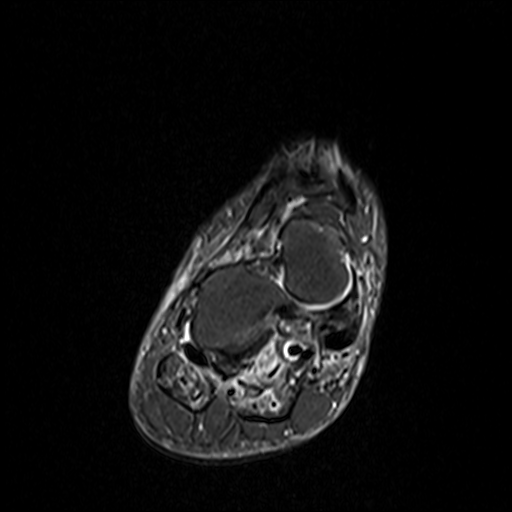
[im 22/40]
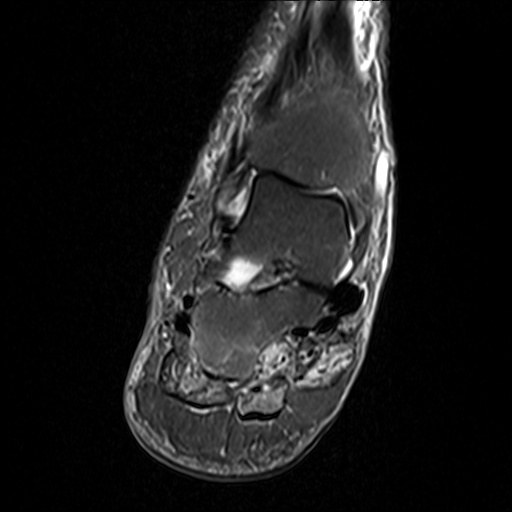
[im 27/40]
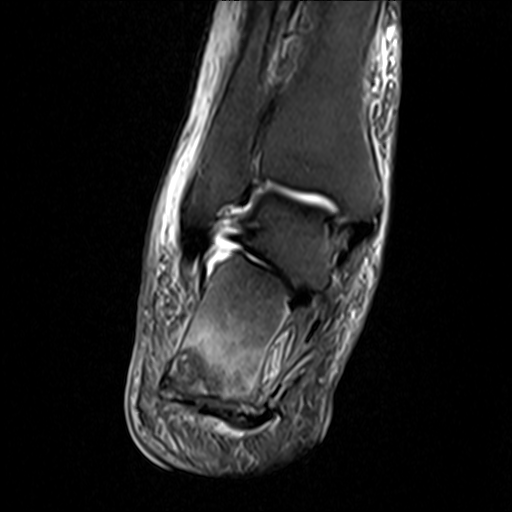
[im 35/40]
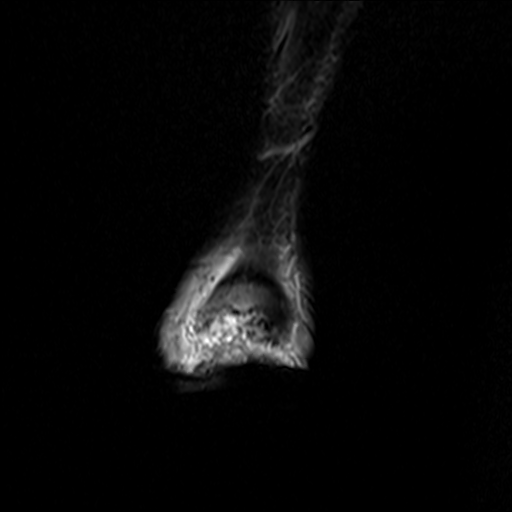
[im 40/40]
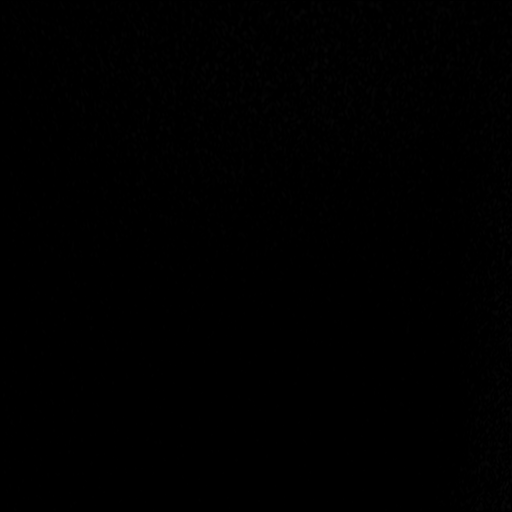

[Series 6: STIR · sagittal · right · 4.0mm · 0.35mm/px · 4 of 25 slices shown (2 of 2)]
[im 1/25]
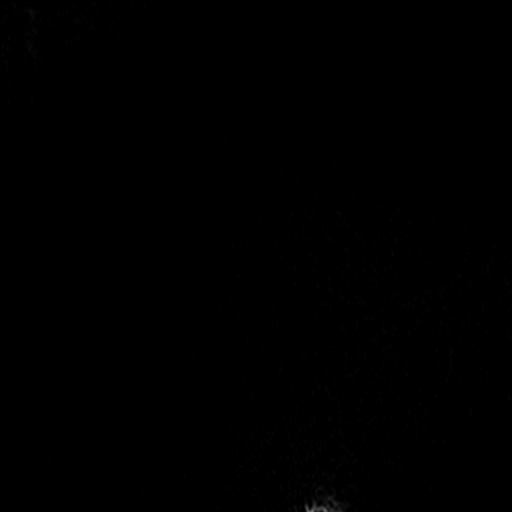
[im 5/25]
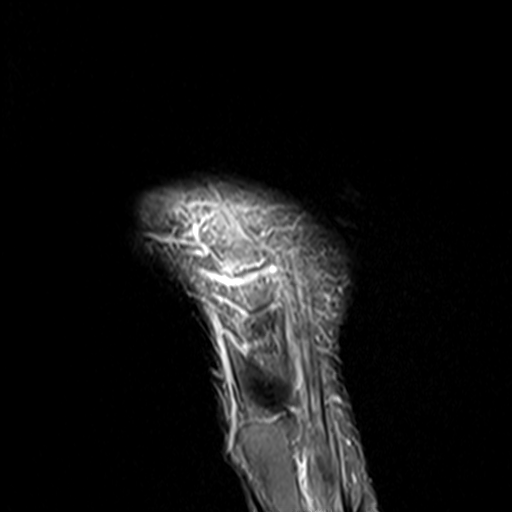
[im 10/25]
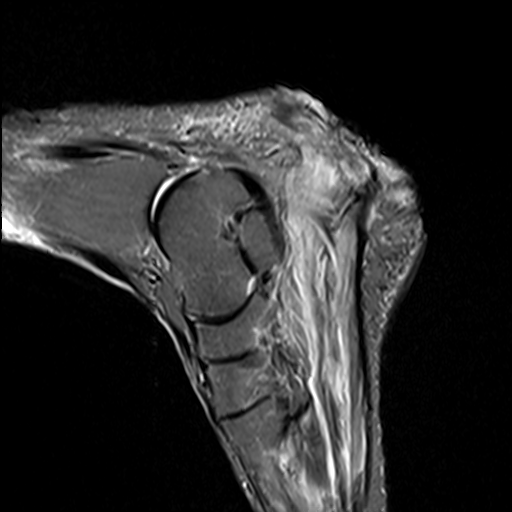
[im 15/25]
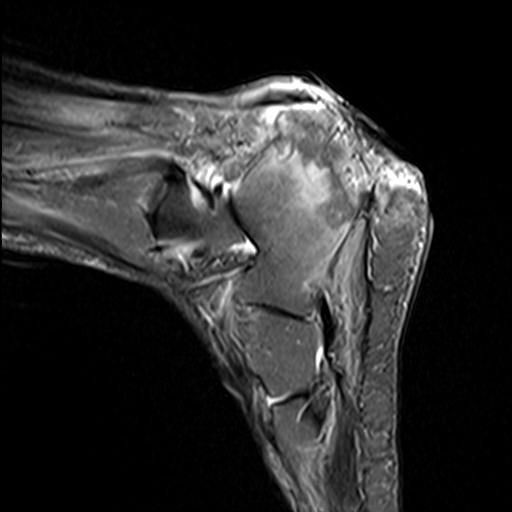

[Series 7: T1 · sagittal · right · 4.0mm · 0.70mm/px · 6 of 22 slices shown]
[im 1/22]
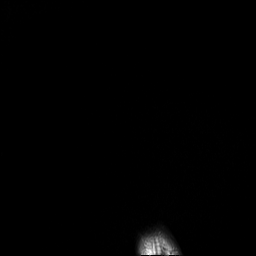
[im 5/22]
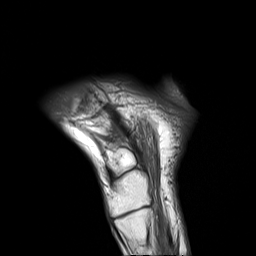
[im 9/22]
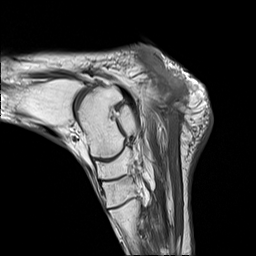
[im 13/22]
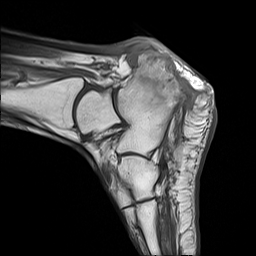
[im 17/22]
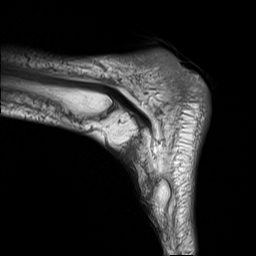
[im 22/22]
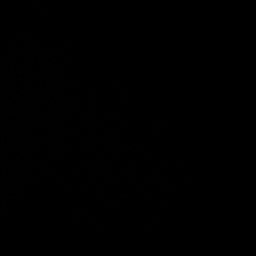

[36 of 40 positions shown; findings below may reference images not displayed]

FINDINGS: There is a large heel ulcer right down to the bone with underlying
osteomyelitis involving the posterior third of the calcaneus. There
is also likely septic retrocalcaneal bursitis and the Achilles
tendon is partially detached from the calcaneus and surrounded by
fluid/pus. Signal abnormality in the tendon could be septic
tendinitis.

No discrete drainable fluid collection/abscess.

Diffuse myositis involving the short flexor muscles of the foot but
no definite findings for pyomyositis.

The tibiotalar joint demonstrates a small effusion but the cartilage
is intact and the subtalar joints are intact. Sinus tarsi is normal.
IMPRESSION: 1. Large and deep heel ulcer extending right down to the bone with
surrounding granulation tissue and cellulitis.
2. Myofasciitis involving the short flexor muscles but no definite
findings for pyomyositis.
3. Extensive osteomyelitis involving the posterior third of the
calcaneus.
4. The Achilles tendon is partially torn from its calcaneal
attachment site and there could be septic tendinitis and septic
retrocalcaneal bursitis.
